# Patient Record
Sex: Male | Born: 1950 | Race: White | Hispanic: No | Smoking: Former smoker
Health system: Southern US, Community
[De-identification: ages and names within clinical notes are randomized; demographics above are authoritative.]

## PROBLEM LIST (undated history)

## (undated) DIAGNOSIS — Z9981 Dependence on supplemental oxygen: Secondary | ICD-10-CM

## (undated) DIAGNOSIS — E785 Hyperlipidemia, unspecified: Secondary | ICD-10-CM

## (undated) DIAGNOSIS — I712 Thoracic aortic aneurysm, without rupture, unspecified: Secondary | ICD-10-CM

## (undated) DIAGNOSIS — F419 Anxiety disorder, unspecified: Secondary | ICD-10-CM

## (undated) DIAGNOSIS — I639 Cerebral infarction, unspecified: Secondary | ICD-10-CM

## (undated) DIAGNOSIS — I2699 Other pulmonary embolism without acute cor pulmonale: Secondary | ICD-10-CM

## (undated) DIAGNOSIS — I10 Essential (primary) hypertension: Secondary | ICD-10-CM

## (undated) DIAGNOSIS — D751 Secondary polycythemia: Secondary | ICD-10-CM

## (undated) DIAGNOSIS — E119 Type 2 diabetes mellitus without complications: Secondary | ICD-10-CM

## (undated) DIAGNOSIS — K219 Gastro-esophageal reflux disease without esophagitis: Secondary | ICD-10-CM

## (undated) DIAGNOSIS — G709 Myoneural disorder, unspecified: Secondary | ICD-10-CM

## (undated) DIAGNOSIS — K625 Hemorrhage of anus and rectum: Secondary | ICD-10-CM

## (undated) DIAGNOSIS — J189 Pneumonia, unspecified organism: Secondary | ICD-10-CM

## (undated) DIAGNOSIS — J449 Chronic obstructive pulmonary disease, unspecified: Secondary | ICD-10-CM

## (undated) DIAGNOSIS — D759 Disease of blood and blood-forming organs, unspecified: Secondary | ICD-10-CM

## (undated) DIAGNOSIS — R0602 Shortness of breath: Secondary | ICD-10-CM

## (undated) DIAGNOSIS — R040 Epistaxis: Secondary | ICD-10-CM

## (undated) DIAGNOSIS — M25519 Pain in unspecified shoulder: Secondary | ICD-10-CM

## (undated) DIAGNOSIS — M1990 Unspecified osteoarthritis, unspecified site: Secondary | ICD-10-CM

## (undated) HISTORY — DX: Cerebral infarction, unspecified: I63.9

## (undated) HISTORY — DX: Hyperlipidemia, unspecified: E78.5

## (undated) HISTORY — PX: MULTIPLE TOOTH EXTRACTIONS: SHX2053

## (undated) HISTORY — DX: Essential (primary) hypertension: I10

## (undated) HISTORY — PX: INSERTION OF VENA CAVA FILTER: SHX5871

## (undated) HISTORY — DX: Hemorrhage of anus and rectum: K62.5

## (undated) HISTORY — DX: Gastro-esophageal reflux disease without esophagitis: K21.9

---

## 2006-09-24 ENCOUNTER — Ambulatory Visit: Payer: Self-pay | Admitting: Internal Medicine

## 2006-11-03 ENCOUNTER — Ambulatory Visit: Payer: Self-pay | Admitting: Internal Medicine

## 2006-11-03 DIAGNOSIS — Z8679 Personal history of other diseases of the circulatory system: Secondary | ICD-10-CM | POA: Insufficient documentation

## 2006-11-04 ENCOUNTER — Encounter: Payer: Self-pay | Admitting: Internal Medicine

## 2006-11-04 ENCOUNTER — Ambulatory Visit: Payer: Self-pay

## 2006-11-04 LAB — CONVERTED CEMR LAB
Cholesterol: 224 mg/dL (ref 0–200)
Direct LDL: 173.5 mg/dL
HDL: 31.4 mg/dL — ABNORMAL LOW (ref >39.0)
Total CHOL/HDL Ratio: 7.1
Triglycerides: 115 mg/dL (ref 0–149)
VLDL: 23 mg/dL (ref 0–40)

## 2006-11-13 ENCOUNTER — Ambulatory Visit: Payer: Self-pay | Admitting: Internal Medicine

## 2006-11-13 DIAGNOSIS — I10 Essential (primary) hypertension: Secondary | ICD-10-CM | POA: Insufficient documentation

## 2006-11-18 DIAGNOSIS — K219 Gastro-esophageal reflux disease without esophagitis: Secondary | ICD-10-CM | POA: Insufficient documentation

## 2006-11-24 ENCOUNTER — Ambulatory Visit: Payer: Self-pay | Admitting: Internal Medicine

## 2007-02-18 ENCOUNTER — Ambulatory Visit: Payer: Self-pay | Admitting: Internal Medicine

## 2008-01-13 ENCOUNTER — Telehealth: Payer: Self-pay | Admitting: Internal Medicine

## 2008-01-15 ENCOUNTER — Ambulatory Visit: Payer: Self-pay | Admitting: Internal Medicine

## 2008-01-15 DIAGNOSIS — E785 Hyperlipidemia, unspecified: Secondary | ICD-10-CM | POA: Insufficient documentation

## 2008-01-15 DIAGNOSIS — F17200 Nicotine dependence, unspecified, uncomplicated: Secondary | ICD-10-CM

## 2008-01-15 LAB — CONVERTED CEMR LAB
CO2: 33 meq/L — ABNORMAL HIGH (ref 19–32)
Chloride: 98 meq/L (ref 96–112)
Glucose, Bld: 97 mg/dL (ref 70–99)
Potassium: 4.5 meq/L (ref 3.5–5.1)
Sodium: 139 meq/L (ref 135–145)

## 2008-06-13 ENCOUNTER — Telehealth: Payer: Self-pay | Admitting: Internal Medicine

## 2008-06-21 ENCOUNTER — Telehealth: Payer: Self-pay | Admitting: Internal Medicine

## 2009-04-24 ENCOUNTER — Ambulatory Visit: Payer: Self-pay | Admitting: Internal Medicine

## 2009-04-27 LAB — CONVERTED CEMR LAB
BUN: 16 mg/dL (ref 6–23)
Chloride: 107 meq/L (ref 96–112)
GFR calc non Af Amer: 91.2 mL/min (ref >60)
Glucose, Bld: 109 mg/dL — ABNORMAL HIGH (ref 70–99)
Potassium: 4.7 meq/L (ref 3.5–5.1)
Sodium: 144 meq/L (ref 135–145)

## 2009-05-09 ENCOUNTER — Ambulatory Visit: Payer: Self-pay | Admitting: Internal Medicine

## 2010-05-08 NOTE — Assessment & Plan Note (Signed)
Summary: cryotherapy/et   Vital Signs:  Patient profile:   60 year old male BP sitting:   128 / 88  (left arm)  Vitals Entered By: Gladis Riffle, RN (May 09, 2009 9:19 AM)  Procedure Note  Skin Tag Removal: Onset of lesion: months Indication: changing lesion Consent signed: no    # lesions removed: 6    Comment: verbal consent  Wart Removal: Onset of lesion: 6 months Indication: enlarging lesion  Procedure # 1: cryotherapy    Location: lip    # lesions removed: 1    Comment: informed onsent obtained  CC: cryotherapy to warts and skin tags upper lip, neck, inner rightt thigh Is Patient Diabetic? No   CC:  cryotherapy to warts and skin tags upper lip, neck, and inner rightt thigh.  Preventive Screening-Counseling & Management  Alcohol-Tobacco     Smoking Status: current     Packs/Day: 0.5  Medications Prior to Update: 1)  Lisinopril-Hydrochlorothiazide 20-25 Mg Tabs (Lisinopril-Hydrochlorothiazide) .... Take 1 Tablet By Mouth Once A Day 2)  Adult Aspirin Ec Low Strength 81 Mg  Tbec (Aspirin) .... One Daily 3)  Cialis 20 Mg Tabs (Tadalafil) .... Take One Every 3 Days As Needed  Allergies (verified): No Known Drug Allergies   Complete Medication List: 1)  Lisinopril-hydrochlorothiazide 20-25 Mg Tabs (Lisinopril-hydrochlorothiazide) .... Take 1 tablet by mouth once a day 2)  Adult Aspirin Ec Low Strength 81 Mg Tbec (Aspirin) .... One daily 3)  Cialis 20 Mg Tabs (Tadalafil) .... Take one every 3 days as needed  Other Orders: Excise other (benign) lesion (FEENLM), 0 - 0.5 cm (11440) Prescriptions: CIALIS 20 MG TABS (TADALAFIL) take one every 3 days as needed  #10 x 3   Entered by:   Gladis Riffle, RN   Authorized by:   Birdie Sons MD   Signed by:   Gladis Riffle, RN on 05/09/2009   Method used:   Electronically to        [facility]  [phone]* (retail)       [address]       [place], [place]  [address]       Ph:  [phone] or [phone]       Fax: [phone]   RxID:   [id number] CIALIS 20 MG TABS (TADALAFIL) take one every 3 days as needed  #10 x 3   Entered by:   Gladis Riffle, RN   Authorized by:   Birdie Sons MD   Signed by:   Gladis Riffle, RN on 05/09/2009   Method used:   Print then Give to Patient   RxID:   [id number]

## 2010-05-08 NOTE — Assessment & Plan Note (Signed)
Summary: fup on meds//ccm   Vital Signs:  Patient profile:   60 year old male Weight:      219 pounds Temp:     97.8 degrees F Pulse rate:   84 / minute Resp:     12 per minute BP sitting:   128 / 90  (left arm)  Vitals Entered By: Gladis Riffle, RN (April 24, 2009 9:14 AM)   History of Present Illness: f/u labs  Preventive Screening-Counseling & Management  Alcohol-Tobacco     Smoking Status: current     Packs/Day: 0.5  Current Problems (verified): 1)  Hyperlipidemia  (ICD-272.4) 2)  Tobacco Use  (ICD-305.1) 3)  Gerd  (ICD-530.81) 4)  Hypertension  (ICD-401.9) 5)  Cerebrovascular Accident, Hx of  (ICD-V12.50)  Current Medications (verified): 1)  Lisinopril-Hydrochlorothiazide 20-25 Mg Tabs (Lisinopril-Hydrochlorothiazide) .... Take 1 Tablet By Mouth Once A Day 2)  Adult Aspirin Ec Low Strength 81 Mg  Tbec (Aspirin) .... One Daily 3)  Cialis 20 Mg Tabs (Tadalafil) .... Q3d Prn  Allergies (verified): No Known Drug Allergies  Comments:  Nurse/Medical Assistant: FU meds--has decreased smoking to 1/2 pack per day, had reaction to patch--BP 132-135/80-85 at home  The patient's medications and allergies were reviewed with the patient and were updated in the Medication and Allergy Lists. Gladis Riffle, RN (April 24, 2009 9:16 AM)  Past History:  Past Medical History: Last updated: 01/15/2008 Cerebrovascular accident, hx of Hypertension GERD Hyperlipidemia  Past Surgical History: Last updated: 11/13/2006 Denies surgical history  Family History: Last updated: 04/24/2009 Family History Hypertension mother copd---86 yo deceased  Social History: Last updated: 01/15/2008 Current Smoker Occupation: Divorced Alcohol use-yes Drug use-no Regular exercise-yes raising 3 granddaughters  Risk Factors: Exercise: yes (11/18/2006)  Risk Factors: Smoking Status: current (04/24/2009) Packs/Day: 0.5 (04/24/2009)  Family History: Family History  Hypertension mother copd---86 yo deceased  Social History: Packs/Day:  0.5   Impression & Recommendations:  Problem # 1:  HYPERTENSION (ICD-401.9)  fair control His updated medication list for this problem includes:    Lisinopril-hydrochlorothiazide 20-25 Mg Tabs (Lisinopril-hydrochlorothiazide) .Marland Kitchen... Take 1 tablet by mouth once a day  BP today: 128/90 Prior BP: 140/94 (01/15/2008)  Prior 10 Yr Risk Heart Disease: Not enough information (11/24/2006)  Labs Reviewed: K+: 4.5 (01/15/2008) Creat: : 1.2 (01/15/2008)   Chol: 224 (11/03/2006)   HDL: 31.4 (11/03/2006)   LDL: DEL (11/03/2006)   TG: 115 (11/03/2006)  Orders: Venipuncture (16109) TLB-BMP (Basic Metabolic Panel-BMET) (80048-METABOL)  Problem # 2:  HYPERLIPIDEMIA (ICD-272.4) he is not interested in checking labs or treating Prior 10 Yr Risk Heart Disease: Not enough information (11/24/2006)   HDL:31.4 (11/03/2006)  LDL:DEL (11/03/2006)  Chol:224 (11/03/2006)  Trig:115 (11/03/2006)  Problem # 3:  TOBACCO USE (ICD-305.1)  Encouraged smoking cessation and discussed different methods for smoking cessation.  information given for Chantix  Problem # 4:  GERD (ICD-530.81) no sxs and not on any meds  Complete Medication List: 1)  Lisinopril-hydrochlorothiazide 20-25 Mg Tabs (Lisinopril-hydrochlorothiazide) .... Take 1 tablet by mouth once a day 2)  Adult Aspirin Ec Low Strength 81 Mg Tbec (Aspirin) .... One daily 3)  Cialis 20 Mg Tabs (Tadalafil) .... Take one every 3 days as needed Prescriptions: CIALIS 20 MG TABS (TADALAFIL) take one every 3 days as needed  #10 x 3   Entered and Authorized by:   Birdie Sons MD   Signed by:   Birdie Sons MD on 04/24/2009   Method used:   Electronically to  [facility]  [address]  [phone]* (retail)       [address]       [place], [place]  [address]       Ph: [phone] or [phone]       Fax: [phone]   RxID:    [id number] LISINOPRIL-HYDROCHLOROTHIAZIDE 20-25 MG TABS (LISINOPRIL-HYDROCHLOROTHIAZIDE) Take 1 tablet by mouth once a day  #90 x 3   Entered and Authorized by:   Birdie Sons MD   Signed by:   Birdie Sons MD on 04/24/2009   Method used:   Electronically to        [facility]  [phone]* (retail)       [address]       [place], [place]  [address]       Ph: [phone] or [phone]       Fax: [phone]   RxID:   [id number]

## 2010-05-28 ENCOUNTER — Encounter: Payer: Self-pay | Admitting: Internal Medicine

## 2010-05-28 ENCOUNTER — Ambulatory Visit (INDEPENDENT_AMBULATORY_CARE_PROVIDER_SITE_OTHER): Payer: Self-pay | Admitting: Internal Medicine

## 2010-05-28 VITALS — BP 120/70 | Temp 98.2°F | Wt 211.0 lb

## 2010-05-28 DIAGNOSIS — K0530 Chronic periodontitis, unspecified: Secondary | ICD-10-CM

## 2010-05-28 DIAGNOSIS — K047 Periapical abscess without sinus: Secondary | ICD-10-CM

## 2010-05-28 MED ORDER — PENICILLIN V POTASSIUM 500 MG PO TABS: 500.0000 mg | ORAL_TABLET | Freq: Four times a day (QID) | ORAL | Status: AC

## 2010-05-28 NOTE — Progress Notes (Signed)
  Subjective:    Patient ID: Cody [name], male    DOB: 12-22-1950, 60 y.o.   MRN: [id number]  HPI  60 year old patient who has chronic periodontal disease and chronic dental infections requiring multiple dental extractions.  His dentist has recommended total extractions Due to chronic significant infections.  More recently has developed increasing pain involving primarily the right upper incisor.  He is able to easily express pus from around this tooth that is quite foul-smelling.  He is plan on seeing his dentist next week for further extractions.  He feels unwell with weakness, nausea.    Review of Systems  Constitutional: Positive for appetite change and fatigue. Negative for fever and chills.  HENT: Negative for hearing loss, ear pain, congestion, sore throat, trouble swallowing, neck stiffness, dental problem, voice change and tinnitus.   Eyes: Negative for pain, discharge and visual disturbance.  Respiratory: Negative for cough, chest tightness, wheezing and stridor.   Cardiovascular: Negative for chest pain, palpitations and leg swelling.  Gastrointestinal: Negative for nausea, vomiting, abdominal pain, diarrhea, constipation, blood in stool and abdominal distention.  Genitourinary: Negative for urgency, hematuria, flank pain, discharge, difficulty urinating and genital sores.  Musculoskeletal: Negative for myalgias, back pain, joint swelling, arthralgias and gait problem.  Skin: Negative for rash.  Neurological: Negative for dizziness, syncope, speech difficulty, weakness, numbness and headaches.  Hematological: Negative for adenopathy. Does not bruise/bleed easily.  Psychiatric/Behavioral: Negative for behavioral problems and dysphoric mood. The patient is not nervous/anxious.        Objective:   Physical Exam  Constitutional: He appears well-developed and well-nourished. No distress.  HENT:  Right Ear: External ear normal.  Left Ear: External ear normal.  Nose: Nose normal.    Mouth/Throat: Oropharynx is clear and moist. No oropharyngeal exudate.       Multiple missing dentition; extremely poor oral hygiene with the multiple carious remaining teeth.  A right upper incisor was loose with pus easily expressed about the tooth          Assessment & Plan:  Chronic dental infection- will place on penicillin; he does have An appointment  to see his dentist next week;  He is aware of the need for prompt dental extraction and will attempt to call his dentist today and schedule an earlier appointment within the next one or two days

## 2010-05-28 NOTE — Patient Instructions (Signed)
Take your antibiotic as prescribed until ALL of it is gone, but stop if you develop a rash, swelling, or any side effects of the medication.  Contact our office as soon as possible if  there are side effects of the medication.  Dental follow up ASAP   Call or return to clinic prn if these symptoms worsen or fail to improve as anticipated.

## 2010-07-05 ENCOUNTER — Other Ambulatory Visit: Payer: Self-pay | Admitting: Internal Medicine

## 2010-07-05 DIAGNOSIS — I10 Essential (primary) hypertension: Secondary | ICD-10-CM

## 2010-09-19 ENCOUNTER — Encounter: Payer: Self-pay | Admitting: Internal Medicine

## 2010-09-19 ENCOUNTER — Ambulatory Visit (INDEPENDENT_AMBULATORY_CARE_PROVIDER_SITE_OTHER): Payer: Self-pay | Admitting: Internal Medicine

## 2010-09-19 VITALS — BP 128/98 | HR 96 | Temp 98.6°F | Ht 70.5 in | Wt 210.0 lb

## 2010-09-19 DIAGNOSIS — L918 Other hypertrophic disorders of the skin: Secondary | ICD-10-CM

## 2010-09-19 DIAGNOSIS — L909 Atrophic disorder of skin, unspecified: Secondary | ICD-10-CM

## 2010-09-27 NOTE — Progress Notes (Signed)
  Subjective:    Patient ID: Cody [name], male    DOB: Jul 13, 1950, 59 y.o.   MRN: [id number]  HPI    Review of Systems     Objective:   Physical Exam        Assessment & Plan:  Patient has a large skin tag on his upper left lip. After risks and benefits were discussed he agreed to excision. The area was prepped and draped in sterile fashion. It was infiltrated with 1% lidocaine and epinephrine. The lesion was excised. Bleeding was controlled. Postoperative care discussed with patient.

## 2011-01-22 ENCOUNTER — Other Ambulatory Visit: Payer: Self-pay | Admitting: Internal Medicine

## 2011-01-22 ENCOUNTER — Telehealth: Payer: Self-pay | Admitting: Internal Medicine

## 2011-01-22 NOTE — Telephone Encounter (Signed)
Refill Lisinopril to [facility]. Thanks.

## 2011-01-23 NOTE — Telephone Encounter (Signed)
rx sent in electronically 

## 2011-06-17 ENCOUNTER — Telehealth: Payer: Self-pay | Admitting: Internal Medicine

## 2011-06-17 NOTE — Telephone Encounter (Signed)
Pt is experiencing fever, cough, fatigue and back ache and would like to be worked in to see Dr. Cato Mulligan. Pt was offered other doctors but insisted on seeing Dr. Cato Mulligan. Please contact

## 2011-06-17 NOTE — Telephone Encounter (Signed)
appt scheduled, pt notified

## 2011-06-17 NOTE — Telephone Encounter (Signed)
Have him see me tomorrow at 8 a.m.

## 2011-06-18 ENCOUNTER — Ambulatory Visit (INDEPENDENT_AMBULATORY_CARE_PROVIDER_SITE_OTHER): Payer: Self-pay | Admitting: Internal Medicine

## 2011-06-18 ENCOUNTER — Encounter: Payer: Self-pay | Admitting: Internal Medicine

## 2011-06-18 VITALS — BP 150/102 | HR 99 | Temp 98.6°F | Wt 218.0 lb

## 2011-06-18 DIAGNOSIS — J449 Chronic obstructive pulmonary disease, unspecified: Secondary | ICD-10-CM

## 2011-06-18 MED ORDER — BUDESONIDE-FORMOTEROL FUMARATE 160-4.5 MCG/ACT IN AERO: 2.0000 | INHALATION_SPRAY | Freq: Two times a day (BID) | RESPIRATORY_TRACT | Status: DC

## 2011-06-18 MED ORDER — PREDNISONE 10 MG PO TABS: ORAL_TABLET | ORAL | Status: DC

## 2011-06-18 MED ORDER — DOXYCYCLINE HYCLATE 100 MG PO TABS: 100.0000 mg | ORAL_TABLET | Freq: Two times a day (BID) | ORAL | Status: AC

## 2011-06-19 DIAGNOSIS — J449 Chronic obstructive pulmonary disease, unspecified: Secondary | ICD-10-CM | POA: Insufficient documentation

## 2011-06-19 NOTE — Assessment & Plan Note (Signed)
He  Understands need to quit smoking. Trial steroids, abx and combination inhaler He will call in 1-2 weeks if not significantly better. At that time he would need labs, and cxr

## 2011-06-19 NOTE — Progress Notes (Signed)
Patient ID: Cody [name], male   DOB: 04-Jan-1951, 61 y.o.   MRN: [id number] Pt presents with fatigue, cough and SOB Smokes 1/2 ppd  Bilateral wheezing on exam.

## 2011-06-25 ENCOUNTER — Telehealth: Payer: Self-pay | Admitting: Internal Medicine

## 2011-06-25 DIAGNOSIS — R05 Cough: Secondary | ICD-10-CM

## 2011-06-25 MED ORDER — POTASSIUM CHLORIDE CRYS ER 20 MEQ PO TBCR: 20.0000 meq | EXTENDED_RELEASE_TABLET | Freq: Every day | ORAL | Status: DC

## 2011-06-25 MED ORDER — FUROSEMIDE 20 MG PO TABS: 20.0000 mg | ORAL_TABLET | Freq: Every day | ORAL | Status: DC

## 2011-06-25 NOTE — Telephone Encounter (Signed)
Pt was prescribed prednisone, but med is causing swelling in ankles and calves. Pt discontinued med on Saturday 06/22/11, but swelling hasn't gone done, but has gotten worse. Pt req call back from nurse.

## 2011-06-25 NOTE — Telephone Encounter (Signed)
Per [name] pt is not coughing yellow  Mucus anymore but it is clear.  Doxycycline is helping but pt is still coughing up a lot of mucus.  Pt is still having SOB.  [name] is aware to call back if pt's symptoms worsen. Rx for furosemide 20 and kdur 20 sent to [facility] on [address].

## 2011-06-25 NOTE — Telephone Encounter (Signed)
Stay off prednisone.  Trial furosemide 20 mg po qd for 5 days with kdur 20 meq po qd for 5 days.  Make sure breathing is better

## 2011-06-26 NOTE — Telephone Encounter (Signed)
Left message for pt to call back  °

## 2011-06-26 NOTE — Telephone Encounter (Signed)
Check cxr .   

## 2011-06-27 NOTE — Telephone Encounter (Signed)
Pt aware, order placed 

## 2011-06-27 NOTE — Telephone Encounter (Signed)
Addended by: Alfred Levins D on: 06/27/2011 11:10 AM   Modules accepted: Orders

## 2011-08-05 ENCOUNTER — Ambulatory Visit (INDEPENDENT_AMBULATORY_CARE_PROVIDER_SITE_OTHER): Payer: Self-pay | Admitting: Family

## 2011-08-05 ENCOUNTER — Encounter: Payer: Self-pay | Admitting: Family

## 2011-08-05 VITALS — BP 162/100 | HR 93 | Temp 97.9°F | Wt 214.0 lb

## 2011-08-05 DIAGNOSIS — R609 Edema, unspecified: Secondary | ICD-10-CM

## 2011-08-05 DIAGNOSIS — R0602 Shortness of breath: Secondary | ICD-10-CM

## 2011-08-05 DIAGNOSIS — J449 Chronic obstructive pulmonary disease, unspecified: Secondary | ICD-10-CM

## 2011-08-05 NOTE — Patient Instructions (Signed)

## 2011-08-05 NOTE — Progress Notes (Signed)
Subjective:    Patient ID: Cody [name], male    DOB: Oct 07, 1950, 61 y.o.   MRN: [id number]  HPI 61 year old white male, half pack per day smoker, patient nerves Dr. Cato Mulligan is in today with complaints of fatigue, shortness of breath, and peripheral edema times one month. Patient was started on Lasix and potassium by Dr. Cato Mulligan a month ago, his swelling increased and Dr. Cato Mulligan discontinue therapy. Patient reports being extremely fatigued towards the end of the day. He is recently decreased from 2 packs of cigarettes a day to a half a pack per day. On his last office visit he was then 78% on room air. Blood pressure 160s over 100. Patient denies lightheadedness, dizziness, chest pain or palpitations.  Socially, patient is raising his 3 granddaughters.    Review of Systems  Constitutional: Positive for fatigue.  HENT: Negative.   Eyes: Negative.   Respiratory: Positive for shortness of breath.   Cardiovascular: Positive for leg swelling. Negative for chest pain and palpitations.  Gastrointestinal: Negative.   Musculoskeletal: Negative.   Skin: Negative.   Neurological: Negative.   Hematological: Negative.   Psychiatric/Behavioral: Negative.    Past Medical History  Diagnosis Date  . Stroke   . Hypertension   . Hyperlipidemia   . GERD (gastroesophageal reflux disease)     History   Social History  . Marital Status: Single    Spouse Name: N/A    Number of Children: N/A  . Years of Education: N/A   Occupational History  . Not on file.   Social History Main Topics  . Smoking status: Current Everyday Smoker -- 0.5 packs/day    Types: Cigarettes  . Smokeless tobacco: Never Used  . Alcohol Use: No  . Drug Use: No  . Sexually Active: Not on file   Other Topics Concern  . Not on file   Social History Narrative  . No narrative on file    No past surgical history on file.  Family History  Problem Relation Age of Onset  . COPD Mother     No Known  Allergies  Current Outpatient Prescriptions on File Prior to Visit  Medication Sig Dispense Refill  . aspirin 81 MG tablet Take 81 mg by mouth daily.        Marland Kitchen lisinopril-hydrochlorothiazide (PRINZIDE,ZESTORETIC) 20-25 MG per tablet TAKE ONE TABLET BY MOUTH EVERY DAY.  90 tablet  1  . tadalafil (CIALIS) 20 MG tablet Take 20 mg by mouth. Take one pill every 3 days as needed       . budesonide-formoterol (SYMBICORT) 160-4.5 MCG/ACT inhaler Inhale 2 puffs into the lungs 2 (two) times daily.  1 Inhaler  3  . furosemide (LASIX) 20 MG tablet Take 1 tablet (20 mg total) by mouth daily.  5 tablet  0  . potassium chloride SA (K-DUR,KLOR-CON) 20 MEQ tablet Take 1 tablet (20 mEq total) by mouth daily.  5 tablet  0  . predniSONE (DELTASONE) 10 MG tablet 4 tabs, then 2 tabs, then 1 tab, then 1/2 tab daily daily for 3 days  25 tablet  0    BP 162/100  Pulse 93  Temp 97.9 F (36.6 C)  Wt 214 lb (97.07 kg)  SpO2 78%chart   Objective:   Physical Exam  Constitutional: He is oriented to person, place, and time. He appears well-developed and well-nourished.  HENT:  Right Ear: External ear normal.  Left Ear: External ear normal.  Nose: Nose normal.  Mouth/Throat: Oropharynx is clear and  moist.  Eyes: Conjunctivae are normal. Pupils are equal, round, and reactive to light.  Neck: Neck supple.  Cardiovascular: Normal rate, regular rhythm and normal heart sounds.   Pulmonary/Chest: Effort normal and breath sounds normal. No respiratory distress.       Sat 78% RA before and after exercise.  Abdominal: Bowel sounds are normal.  Musculoskeletal: He exhibits edema.        bilateral peripheral edema. 2+ pitting edema noted to the right. 1+ pitting edema noted to the left.  Neurological: He is alert and oriented to person, place, and time.  Skin: Skin is warm and dry.  Psychiatric: He has a normal mood and affect.       EKG-old ischemic. Possible right atrium enlargement    Assessment & Plan:   Assessment: Peripheral edema, COPD, fatigue  Plan: Labs sents to include BMP, BNP notify patient pending results. We'll obtain a chest x-ray. Patient will resume his Symbicort 2 puffs twice a day. I believe that his symptoms today are related to congestive heart failure. Will await lab results and x-ray and refer to cardiology for further management. Home O2 will be ordered since he statin 78%.

## 2011-08-06 ENCOUNTER — Other Ambulatory Visit: Payer: Self-pay | Admitting: Family

## 2011-08-06 DIAGNOSIS — R0902 Hypoxemia: Secondary | ICD-10-CM

## 2011-08-06 LAB — BASIC METABOLIC PANEL
BUN: 14 mg/dL (ref 6–23)
Calcium: 9 mg/dL (ref 8.4–10.5)
GFR: 94.14 mL/min (ref >60.00)
Glucose, Bld: 85 mg/dL (ref 70–99)

## 2011-08-07 ENCOUNTER — Other Ambulatory Visit: Payer: Self-pay | Admitting: Family

## 2011-08-07 ENCOUNTER — Telehealth: Payer: Self-pay | Admitting: Internal Medicine

## 2011-08-07 DIAGNOSIS — Z720 Tobacco use: Secondary | ICD-10-CM

## 2011-08-07 DIAGNOSIS — R0602 Shortness of breath: Secondary | ICD-10-CM

## 2011-08-07 NOTE — Telephone Encounter (Signed)
[facility] called re: the referral that was sent over yesterday for oxygen. [facility] can not provide oxygen for pt because pt is self pay and does not have any insurance.

## 2011-08-07 NOTE — Telephone Encounter (Signed)
rerouting

## 2011-08-07 NOTE — Telephone Encounter (Signed)
Patient needs oxygen. Please find someone to provide assistance.

## 2011-08-27 ENCOUNTER — Institutional Professional Consult (permissible substitution): Payer: Self-pay | Admitting: Cardiology

## 2011-08-27 ENCOUNTER — Other Ambulatory Visit: Payer: Self-pay | Admitting: Internal Medicine

## 2011-09-04 ENCOUNTER — Telehealth: Payer: Self-pay | Admitting: Family Medicine

## 2011-09-04 NOTE — Telephone Encounter (Signed)
We received notification that this patient did not get the chest xray that was ordered 06/27/11. Thank you.

## 2011-09-09 ENCOUNTER — Ambulatory Visit (INDEPENDENT_AMBULATORY_CARE_PROVIDER_SITE_OTHER): Payer: Self-pay | Admitting: Internal Medicine

## 2011-09-09 VITALS — BP 156/100 | HR 100 | Temp 98.2°F | Wt 212.0 lb

## 2011-09-09 DIAGNOSIS — J449 Chronic obstructive pulmonary disease, unspecified: Secondary | ICD-10-CM

## 2011-09-09 DIAGNOSIS — I10 Essential (primary) hypertension: Secondary | ICD-10-CM

## 2011-09-09 DIAGNOSIS — F17200 Nicotine dependence, unspecified, uncomplicated: Secondary | ICD-10-CM

## 2011-09-09 MED ORDER — LISINOPRIL 40 MG PO TABS: 40.0000 mg | ORAL_TABLET | Freq: Every day | ORAL | Status: DC

## 2011-09-09 MED ORDER — HYDROCHLOROTHIAZIDE 25 MG PO TABS: 25.0000 mg | ORAL_TABLET | Freq: Every day | ORAL | Status: DC

## 2011-09-09 NOTE — Assessment & Plan Note (Signed)
HE HAS QUIT SMOKING!

## 2011-09-09 NOTE — Assessment & Plan Note (Signed)
Not as well controlled But he is feeling well  Reviewed previous BP (BP at dentist office also elevated 180/105) Recheck bp 150/100.  Will increase lisinopril

## 2011-09-09 NOTE — Assessment & Plan Note (Signed)
HE HAS QUIT!!!

## 2011-09-10 NOTE — Progress Notes (Signed)
Patient ID: Cody [name], male   DOB: [birth year], 61 y.o.   MRN: [id number]  htn--- home bps 150s/90-105

## 2011-09-27 ENCOUNTER — Institutional Professional Consult (permissible substitution): Payer: Self-pay | Admitting: Cardiology

## 2011-10-15 ENCOUNTER — Institutional Professional Consult (permissible substitution): Payer: Self-pay | Admitting: Cardiology

## 2011-11-25 ENCOUNTER — Institutional Professional Consult (permissible substitution): Payer: Self-pay | Admitting: Cardiology

## 2011-12-25 ENCOUNTER — Institutional Professional Consult (permissible substitution): Payer: Self-pay | Admitting: Cardiology

## 2012-02-21 ENCOUNTER — Other Ambulatory Visit: Payer: Self-pay | Admitting: Internal Medicine

## 2012-02-21 MED ORDER — AMLODIPINE BESYLATE 5 MG PO TABS: 5.0000 mg | ORAL_TABLET | Freq: Every day | ORAL | Status: DC

## 2012-02-21 NOTE — Progress Notes (Signed)
Pt called me--- bp elevated 160s/100  Call in norvasc- see me next week for bp check and labs

## 2012-02-28 ENCOUNTER — Ambulatory Visit (INDEPENDENT_AMBULATORY_CARE_PROVIDER_SITE_OTHER): Payer: Self-pay | Admitting: Family Medicine

## 2012-02-28 ENCOUNTER — Ambulatory Visit: Payer: Self-pay | Admitting: Internal Medicine

## 2012-02-28 ENCOUNTER — Encounter: Payer: Self-pay | Admitting: Family Medicine

## 2012-02-28 VITALS — BP 192/120 | HR 97 | Temp 97.9°F | Wt 226.0 lb

## 2012-02-28 DIAGNOSIS — R0989 Other specified symptoms and signs involving the circulatory and respiratory systems: Secondary | ICD-10-CM

## 2012-02-28 DIAGNOSIS — I10 Essential (primary) hypertension: Secondary | ICD-10-CM

## 2012-02-28 DIAGNOSIS — R0683 Snoring: Secondary | ICD-10-CM

## 2012-02-28 MED ORDER — HYDROCHLOROTHIAZIDE 25 MG PO TABS: 25.0000 mg | ORAL_TABLET | Freq: Every day | ORAL | Status: DC

## 2012-02-28 NOTE — Patient Instructions (Addendum)
-  restart your HYDROCHLOROTHIAZIDE (HCTZ/HYDRODIURIL)   -Take the HCTZ, NORVASC and LISINOPRIL daily  Follow up with Dr. Cato Mulligan in 2-4 weeks or sooner if any concerns

## 2012-02-28 NOTE — Progress Notes (Signed)
Chief Complaint  Patient presents with  . Hypertension    HPI:  HTN: -long history problems with BP -running high on and off for a long time -his PCP doubled lisinopril to 40mg  daily 5 weeks ago, then added norvasc 5mg  last week -HCTZ 25mg  on list - but reports he hasn't been taking this lately because it was combined with his lisinopril but doesn't know why he isn't taking this anymore - reports this had really helped his BP -here today for recheck and BP with nurse was still very high -reports checking everyday at home and runs 177 -190s/120 -on ROC BP running high for a long time, no documentation in chart of recent med changes reported by patient - pt reports has been talking via phone to provider -denies: CP, SOB, fevers, swelling, palpitations, dysuria, vision changes -has headaches on and off since this happened - no headache now, but does have suboccipital muscle pain chronically -no alcohol, never tested for OSA - wife reports snores and has apneic spells, reports he doesn't sleep well and does have daytime drowsiness  ROS: See pertinent positives and negatives per HPI.  Past Medical History  Diagnosis Date  . Stroke   . Hypertension   . Hyperlipidemia   . GERD (gastroesophageal reflux disease)     Family History  Problem Relation Age of Onset  . COPD Mother     History   Social History  . Marital Status: Single    Spouse Name: N/A    Number of Children: N/A  . Years of Education: N/A   Social History Main Topics  . Smoking status: Current Every Day Smoker -- 0.5 packs/day    Types: Cigarettes  . Smokeless tobacco: Never Used  . Alcohol Use: No  . Drug Use: No  . Sexually Active: None   Other Topics Concern  . None   Social History Narrative  . None    Current outpatient prescriptions:amLODipine (NORVASC) 5 MG tablet, Take 1 tablet (5 mg total) by mouth daily., Disp: 90 tablet, Rfl: 3;  aspirin 81 MG tablet, Take 81 mg by mouth daily.  , Disp: , Rfl:  ;  budesonide-formoterol (SYMBICORT) 160-4.5 MCG/ACT inhaler, Inhale 2 puffs into the lungs 2 (two) times daily., Disp: 1 Inhaler, Rfl: 3 lisinopril (PRINIVIL,ZESTRIL) 40 MG tablet, Take 1 tablet (40 mg total) by mouth daily., Disp: 90 tablet, Rfl: 3;  tadalafil (CIALIS) 20 MG tablet, Take 20 mg by mouth. Take one pill every 3 days as needed , Disp: , Rfl: ;  hydrochlorothiazide (HYDRODIURIL) 25 MG tablet, Take 1 tablet (25 mg total) by mouth daily., Disp: 90 tablet, Rfl: 3 [DISCONTINUED] hydrochlorothiazide (HYDRODIURIL) 25 MG tablet, Take 1 tablet (25 mg total) by mouth daily., Disp: 90 tablet, Rfl: 3  EXAM:  Filed Vitals:   02/28/12 1106  BP: 192/120  Pulse: 97  Temp: 97.9 F (36.6 C)   Recheck BP sitting: 162/110 There is no height on file to calculate BMI.  GENERAL: vitals reviewed and listed above, alert, oriented, appears well hydrated and in no acute distress  HEENT: atraumatic, conjunttiva clear, no obvious abnormalities on inspection of external nose and ears  NECK: no obvious masses on inspection  LUNGS: clear to auscultation bilaterally, no wheezes, rales or rhonchi, good air movement  CV: HRRR, no peripheral edema  MS: moves all extremities without noticeable abnormality, TTP suboccipital muscles - mild  PSYCH: pleasant and cooperative, no obvious depression or anxiety  ASSESSMENT AND PLAN:  Discussed the following assessment  and plan:  1. Hypertension  -uncontrolled, asymptomatic currently -appears he is confused about which meds he should be taking -per med list looks like should be on HCTZ too - reports script not at pharmacy and is why he stopped it -per wife snores, apneic spells in sleep, per pt daytime drowsiness   -restart hydrochlorothiazide (HYDRODIURIL) 25 MG tablet -continue other meds -follow up with nurse in 2 weeks -follow up with PCP in 2-4 weeks and notify PCP if BP running high at home -warned to see a doctor immediately if any CP, HAs, vision  changes, feeling bad or other concerns -advised sleep study - pt wants to discuss with PCP     2. Snoring, apneic spells in sleep per wife, daytime sleepiness, restless sleep - only sleeps 2 hours per night -advised sleep study, wants to discuss with PCP        -Patient advised to return or notify a doctor immediately if symptoms worsen or persist or new concerns arise.  Patient Instructions  -restart your HYDROCHLOROTHIAZIDE (HCTZ/HYDRODIURIL)   -Take the HCTZ, NORVASC and LISINOPRIL daily  Follow up with Dr. Sherlean Foot, [facility].

## 2012-03-11 ENCOUNTER — Encounter: Payer: Self-pay | Admitting: Internal Medicine

## 2012-03-25 ENCOUNTER — Encounter: Payer: Self-pay | Admitting: Internal Medicine

## 2012-03-25 ENCOUNTER — Ambulatory Visit (INDEPENDENT_AMBULATORY_CARE_PROVIDER_SITE_OTHER): Payer: Self-pay | Admitting: Internal Medicine

## 2012-03-25 VITALS — BP 180/112 | HR 102 | Temp 97.9°F | Wt 229.0 lb

## 2012-03-25 DIAGNOSIS — R5383 Other fatigue: Secondary | ICD-10-CM

## 2012-03-25 DIAGNOSIS — R5381 Other malaise: Secondary | ICD-10-CM

## 2012-03-25 LAB — LIPID PANEL
Cholesterol: 186 mg/dL (ref 0–200)
LDL Cholesterol: 125 mg/dL — ABNORMAL HIGH (ref 0–99)
Total CHOL/HDL Ratio: 5

## 2012-03-25 LAB — CBC WITH DIFFERENTIAL/PLATELET
Eosinophils Relative: 1.8 % (ref 0.0–5.0)
HCT: 55.7 % — ABNORMAL HIGH (ref 39.0–52.0)
Lymphs Abs: 1.3 10*3/uL (ref 0.7–4.0)
MCV: 92.3 fl (ref 78.0–100.0)
Monocytes Absolute: 0.8 10*3/uL (ref 0.1–1.0)
Platelets: 182 10*3/uL (ref 150.0–400.0)
WBC: 8.2 10*3/uL (ref 4.5–10.5)

## 2012-03-25 LAB — TSH: TSH: 0.85 u[IU]/mL (ref 0.35–5.50)

## 2012-03-28 NOTE — Progress Notes (Signed)
Patient ID: DESHAWN WITTY, male   DOB: [birth year], 61 y.o.   MRN: [id number] Not an ov

## 2012-04-03 ENCOUNTER — Other Ambulatory Visit: Payer: Self-pay | Admitting: Internal Medicine

## 2012-04-03 DIAGNOSIS — F17200 Nicotine dependence, unspecified, uncomplicated: Secondary | ICD-10-CM

## 2012-04-08 HISTORY — PX: TRACHEOSTOMY: SUR1362

## 2012-04-12 ENCOUNTER — Encounter (HOSPITAL_COMMUNITY): Payer: Self-pay

## 2012-04-12 ENCOUNTER — Emergency Department (HOSPITAL_COMMUNITY): Payer: Medicaid Other

## 2012-04-12 ENCOUNTER — Inpatient Hospital Stay (HOSPITAL_COMMUNITY)
Admission: EM | Admit: 2012-04-12 | Discharge: 2012-05-29 | DRG: 003 | Disposition: A | Discharge status: Rehabilitation Facility | Payer: Medicaid Other | Attending: Pulmonary Disease | Admitting: Pulmonary Disease

## 2012-04-12 DIAGNOSIS — D45 Polycythemia vera: Secondary | ICD-10-CM | POA: Diagnosis present

## 2012-04-12 DIAGNOSIS — N289 Disorder of kidney and ureter, unspecified: Secondary | ICD-10-CM

## 2012-04-12 DIAGNOSIS — J9600 Acute respiratory failure, unspecified whether with hypoxia or hypercapnia: Principal | ICD-10-CM | POA: Diagnosis present

## 2012-04-12 DIAGNOSIS — J449 Chronic obstructive pulmonary disease, unspecified: Secondary | ICD-10-CM

## 2012-04-12 DIAGNOSIS — Z781 Physical restraint status: Secondary | ICD-10-CM | POA: Diagnosis not present

## 2012-04-12 DIAGNOSIS — J398 Other specified diseases of upper respiratory tract: Secondary | ICD-10-CM | POA: Diagnosis present

## 2012-04-12 DIAGNOSIS — J9602 Acute respiratory failure with hypercapnia: Secondary | ICD-10-CM

## 2012-04-12 DIAGNOSIS — E785 Hyperlipidemia, unspecified: Secondary | ICD-10-CM | POA: Diagnosis present

## 2012-04-12 DIAGNOSIS — R197 Diarrhea, unspecified: Secondary | ICD-10-CM | POA: Diagnosis not present

## 2012-04-12 DIAGNOSIS — E46 Unspecified protein-calorie malnutrition: Secondary | ICD-10-CM | POA: Diagnosis present

## 2012-04-12 DIAGNOSIS — R5381 Other malaise: Secondary | ICD-10-CM | POA: Diagnosis not present

## 2012-04-12 DIAGNOSIS — G9340 Encephalopathy, unspecified: Secondary | ICD-10-CM

## 2012-04-12 DIAGNOSIS — Z23 Encounter for immunization: Secondary | ICD-10-CM

## 2012-04-12 DIAGNOSIS — J690 Pneumonitis due to inhalation of food and vomit: Secondary | ICD-10-CM | POA: Diagnosis present

## 2012-04-12 DIAGNOSIS — F17200 Nicotine dependence, unspecified, uncomplicated: Secondary | ICD-10-CM | POA: Diagnosis present

## 2012-04-12 DIAGNOSIS — J441 Chronic obstructive pulmonary disease with (acute) exacerbation: Secondary | ICD-10-CM | POA: Diagnosis present

## 2012-04-12 DIAGNOSIS — T783XXA Angioneurotic edema, initial encounter: Secondary | ICD-10-CM | POA: Diagnosis not present

## 2012-04-12 DIAGNOSIS — T457X5A Adverse effect of anticoagulant antagonists, vitamin K and other coagulants, initial encounter: Secondary | ICD-10-CM | POA: Diagnosis not present

## 2012-04-12 DIAGNOSIS — I9589 Other hypotension: Secondary | ICD-10-CM | POA: Diagnosis present

## 2012-04-12 DIAGNOSIS — T80218A Other infection due to central venous catheter, initial encounter: Secondary | ICD-10-CM | POA: Diagnosis not present

## 2012-04-12 DIAGNOSIS — E871 Hypo-osmolality and hyponatremia: Secondary | ICD-10-CM | POA: Diagnosis not present

## 2012-04-12 DIAGNOSIS — J1100 Influenza due to unidentified influenza virus with unspecified type of pneumonia: Secondary | ICD-10-CM | POA: Diagnosis present

## 2012-04-12 DIAGNOSIS — I509 Heart failure, unspecified: Secondary | ICD-10-CM

## 2012-04-12 DIAGNOSIS — Z930 Tracheostomy status: Secondary | ICD-10-CM

## 2012-04-12 DIAGNOSIS — N390 Urinary tract infection, site not specified: Secondary | ICD-10-CM | POA: Diagnosis not present

## 2012-04-12 DIAGNOSIS — I2699 Other pulmonary embolism without acute cor pulmonale: Secondary | ICD-10-CM

## 2012-04-12 DIAGNOSIS — D751 Secondary polycythemia: Secondary | ICD-10-CM

## 2012-04-12 DIAGNOSIS — E876 Hypokalemia: Secondary | ICD-10-CM | POA: Diagnosis not present

## 2012-04-12 DIAGNOSIS — J988 Other specified respiratory disorders: Secondary | ICD-10-CM | POA: Diagnosis present

## 2012-04-12 DIAGNOSIS — I824Y9 Acute embolism and thrombosis of unspecified deep veins of unspecified proximal lower extremity: Secondary | ICD-10-CM | POA: Diagnosis not present

## 2012-04-12 DIAGNOSIS — K560 Paralytic ileus: Secondary | ICD-10-CM | POA: Diagnosis not present

## 2012-04-12 DIAGNOSIS — IMO0002 Reserved for concepts with insufficient information to code with codable children: Secondary | ICD-10-CM | POA: Diagnosis not present

## 2012-04-12 DIAGNOSIS — Z7982 Long term (current) use of aspirin: Secondary | ICD-10-CM

## 2012-04-12 DIAGNOSIS — Z79899 Other long term (current) drug therapy: Secondary | ICD-10-CM

## 2012-04-12 DIAGNOSIS — Z8673 Personal history of transient ischemic attack (TIA), and cerebral infarction without residual deficits: Secondary | ICD-10-CM

## 2012-04-12 DIAGNOSIS — N179 Acute kidney failure, unspecified: Secondary | ICD-10-CM

## 2012-04-12 DIAGNOSIS — Z87891 Personal history of nicotine dependence: Secondary | ICD-10-CM

## 2012-04-12 DIAGNOSIS — K219 Gastro-esophageal reflux disease without esophagitis: Secondary | ICD-10-CM | POA: Diagnosis present

## 2012-04-12 DIAGNOSIS — I10 Essential (primary) hypertension: Secondary | ICD-10-CM | POA: Diagnosis present

## 2012-04-12 DIAGNOSIS — I4891 Unspecified atrial fibrillation: Secondary | ICD-10-CM

## 2012-04-12 DIAGNOSIS — E119 Type 2 diabetes mellitus without complications: Secondary | ICD-10-CM | POA: Diagnosis not present

## 2012-04-12 DIAGNOSIS — Y849 Medical procedure, unspecified as the cause of abnormal reaction of the patient, or of later complication, without mention of misadventure at the time of the procedure: Secondary | ICD-10-CM | POA: Diagnosis not present

## 2012-04-12 HISTORY — DX: Chronic obstructive pulmonary disease, unspecified: J44.9

## 2012-04-12 LAB — CBC WITH DIFFERENTIAL/PLATELET
Eosinophils Absolute: 0 10*3/uL (ref 0.0–0.7)
Hemoglobin: 20.1 g/dL — ABNORMAL HIGH (ref 13.0–17.0)
Lymphocytes Relative: 7 % — ABNORMAL LOW (ref 12–46)
Lymphs Abs: 0.7 10*3/uL (ref 0.7–4.0)
MCH: 31.8 pg (ref 26.0–34.0)
MCV: 97.8 fL (ref 78.0–100.0)
Monocytes Relative: 15 % — ABNORMAL HIGH (ref 3–12)
Neutrophils Relative %: 77 % (ref 43–77)
RBC: 6.32 MIL/uL — ABNORMAL HIGH (ref 4.22–5.81)
WBC: 9.9 10*3/uL (ref 4.0–10.5)

## 2012-04-12 LAB — POCT I-STAT 3, ART BLOOD GAS (G3+)
Acid-base deficit: 1 mmol/L (ref 0.0–2.0)
O2 Saturation: 100 %
TCO2: 34 mmol/L (ref 0–100)

## 2012-04-12 LAB — TROPONIN I: Troponin I: <0.3 ng/mL (ref <0.30)

## 2012-04-12 LAB — BASIC METABOLIC PANEL
BUN: 47 mg/dL — ABNORMAL HIGH (ref 6–23)
CO2: 32 mEq/L (ref 19–32)
GFR calc non Af Amer: 37 mL/min — ABNORMAL LOW (ref >90)
Glucose, Bld: 169 mg/dL — ABNORMAL HIGH (ref 70–99)
Potassium: 5.1 mEq/L (ref 3.5–5.1)

## 2012-04-12 LAB — URINALYSIS, ROUTINE W REFLEX MICROSCOPIC
Ketones, ur: NEGATIVE mg/dL
Nitrite: NEGATIVE
Protein, ur: 100 mg/dL — AB
Urobilinogen, UA: 1 mg/dL (ref 0.0–1.0)

## 2012-04-12 MED ORDER — ROCURONIUM BROMIDE 50 MG/5ML IV SOLN
INTRAVENOUS | Status: AC
  Filled 2012-04-12: qty 2

## 2012-04-12 MED ORDER — IPRATROPIUM BROMIDE 0.02 % IN SOLN
0.5000 mg | Freq: Once | RESPIRATORY_TRACT | Status: AC
  Administered 2012-04-12: 0.5 mg via RESPIRATORY_TRACT

## 2012-04-12 MED ORDER — PIPERACILLIN-TAZOBACTAM 3.375 G IVPB 30 MIN
3.3750 g | Freq: Once | INTRAVENOUS | Status: AC
  Administered 2012-04-12: 3.375 g via INTRAVENOUS
  Filled 2012-04-12: qty 50

## 2012-04-12 MED ORDER — SODIUM CHLORIDE 0.9 % IV BOLUS (SEPSIS)
1000.0000 mL | Freq: Once | INTRAVENOUS | Status: AC
  Administered 2012-04-12: 1000 mL via INTRAVENOUS

## 2012-04-12 MED ORDER — PROPOFOL 10 MG/ML IV EMUL: 5.0000 ug/kg/min | Freq: Once | INTRAVENOUS | Status: DC

## 2012-04-12 MED ORDER — ALBUTEROL SULFATE (5 MG/ML) 0.5% IN NEBU
INHALATION_SOLUTION | RESPIRATORY_TRACT | Status: AC
  Filled 2012-04-12: qty 0.5

## 2012-04-12 MED ORDER — MIDAZOLAM HCL 2 MG/2ML IJ SOLN
INTRAMUSCULAR | Status: AC
  Administered 2012-04-12: 2 mg via INTRAVENOUS
  Filled 2012-04-12: qty 2

## 2012-04-12 MED ORDER — ALBUTEROL SULFATE (5 MG/ML) 0.5% IN NEBU
INHALATION_SOLUTION | RESPIRATORY_TRACT | Status: AC
  Filled 2012-04-12: qty 1

## 2012-04-12 MED ORDER — DEXTROSE 5 % IV SOLN
500.0000 mg | INTRAVENOUS | Status: DC
  Administered 2012-04-12 – 2012-04-14 (×2): 500 mg via INTRAVENOUS
  Filled 2012-04-12 (×3): qty 500

## 2012-04-12 MED ORDER — IPRATROPIUM BROMIDE 0.02 % IN SOLN
RESPIRATORY_TRACT | Status: AC
  Filled 2012-04-12: qty 2.5

## 2012-04-12 MED ORDER — FUROSEMIDE 10 MG/ML IJ SOLN
40.0000 mg | Freq: Once | INTRAMUSCULAR | Status: AC
  Administered 2012-04-12: 40 mg via INTRAVENOUS
  Filled 2012-04-12: qty 4

## 2012-04-12 MED ORDER — PROPOFOL 10 MG/ML IV EMUL
5.0000 ug/kg/min | Freq: Once | INTRAVENOUS | Status: DC
  Administered 2012-04-12: 25 ug/kg/min via INTRAVENOUS

## 2012-04-12 MED ORDER — MIDAZOLAM HCL 2 MG/2ML IJ SOLN
4.0000 mg | Freq: Once | INTRAMUSCULAR | Status: AC
  Administered 2012-04-12: 4 mg via INTRAVENOUS

## 2012-04-12 MED ORDER — ALBUTEROL SULFATE (5 MG/ML) 0.5% IN NEBU
5.0000 mg | INHALATION_SOLUTION | Freq: Once | RESPIRATORY_TRACT | Status: AC
  Administered 2012-04-12: 5 mg via RESPIRATORY_TRACT

## 2012-04-12 MED ORDER — MIDAZOLAM HCL 2 MG/2ML IJ SOLN
INTRAMUSCULAR | Status: AC
  Filled 2012-04-12: qty 4

## 2012-04-12 MED ORDER — METHYLPREDNISOLONE SODIUM SUCC 125 MG IJ SOLR
125.0000 mg | Freq: Once | INTRAMUSCULAR | Status: AC
  Administered 2012-04-12: 125 mg via INTRAVENOUS
  Filled 2012-04-12: qty 2

## 2012-04-12 MED ORDER — IPRATROPIUM BROMIDE 0.02 % IN SOLN
0.5000 mg | Freq: Once | RESPIRATORY_TRACT | Status: AC
  Administered 2012-04-12: 0.5 mg via RESPIRATORY_TRACT
  Filled 2012-04-12: qty 2.5

## 2012-04-12 MED ORDER — ALBUTEROL SULFATE (5 MG/ML) 0.5% IN NEBU
2.5000 mg | INHALATION_SOLUTION | Freq: Once | RESPIRATORY_TRACT | Status: AC
  Administered 2012-04-12: 2.5 mg via RESPIRATORY_TRACT
  Filled 2012-04-12: qty 0.5

## 2012-04-12 MED ORDER — MIDAZOLAM HCL 2 MG/2ML IJ SOLN
2.0000 mg | Freq: Once | INTRAMUSCULAR | Status: AC
  Administered 2012-04-12: 2 mg via INTRAVENOUS

## 2012-04-12 MED ORDER — LIDOCAINE HCL (CARDIAC) 20 MG/ML IV SOLN
INTRAVENOUS | Status: AC
  Filled 2012-04-12: qty 5

## 2012-04-12 MED ORDER — SODIUM CHLORIDE 0.9 % IV SOLN
1.0000 mg/h | INTRAVENOUS | Status: DC
  Administered 2012-04-12: 2 mg/h via INTRAVENOUS
  Administered 2012-04-13: 1 mg/h via INTRAVENOUS
  Filled 2012-04-12 (×2): qty 10

## 2012-04-12 MED ORDER — PROPOFOL 10 MG/ML IV EMUL
INTRAVENOUS | Status: AC
  Filled 2012-04-12: qty 100

## 2012-04-12 MED ORDER — PHENYLEPHRINE HCL 10 MG/ML IJ SOLN
30.0000 ug/min | Freq: Once | INTRAMUSCULAR | Status: AC
  Administered 2012-04-12: 30 ug/min via INTRAVENOUS
  Filled 2012-04-12: qty 1

## 2012-04-12 MED ORDER — ETOMIDATE 2 MG/ML IV SOLN
INTRAVENOUS | Status: AC
  Administered 2012-04-12: 20 mg
  Filled 2012-04-12: qty 20

## 2012-04-12 MED ORDER — ALBUTEROL SULFATE (5 MG/ML) 0.5% IN NEBU
2.5000 mg | INHALATION_SOLUTION | Freq: Once | RESPIRATORY_TRACT | Status: AC
  Administered 2012-04-12: 2.5 mg via RESPIRATORY_TRACT

## 2012-04-12 MED ORDER — SUCCINYLCHOLINE CHLORIDE 20 MG/ML IJ SOLN
INTRAMUSCULAR | Status: AC
  Administered 2012-04-12: 125 mg
  Filled 2012-04-12: qty 1

## 2012-04-12 MED ORDER — MIDAZOLAM HCL 5 MG/5ML IJ SOLN
4.0000 mg | Freq: Once | INTRAMUSCULAR | Status: AC
  Administered 2012-04-12: 4 mg via INTRAVENOUS

## 2012-04-12 NOTE — ED Provider Notes (Deleted)
History     CSN: [id number]  Arrival date & time 04/12/12  1739   First MD Initiated Contact with Patient 04/12/12 1750      Chief Complaint  Patient presents with  . Shortness of Breath    (Consider location/radiation/quality/duration/timing/severity/associated sxs/prior treatment) HPI  Past Medical History  Diagnosis Date  . Stroke   . Hypertension   . Hyperlipidemia   . GERD (gastroesophageal reflux disease)   . COPD (chronic obstructive pulmonary disease)     History reviewed. No pertinent past surgical history.  Family History  Problem Relation Age of Onset  . COPD Mother     History  Substance Use Topics  . Smoking status: Former Smoker -- 0.5 packs/day    Types: Cigarettes  . Smokeless tobacco: Never Used  . Alcohol Use: No      Review of Systems  Allergies  Review of patient's allergies indicates no known allergies.  Home Medications   Current Outpatient Rx  Name  Route  Sig  Dispense  Refill  . AMLODIPINE BESYLATE 5 MG PO TABS   Oral   Take 1 tablet (5 mg total) by mouth daily.   90 tablet   3   . ASPIRIN 81 MG PO TABS   Oral   Take 81 mg by mouth daily.           . BUDESONIDE-FORMOTEROL FUMARATE 160-4.5 MCG/ACT IN AERO   Inhalation   Inhale 2 puffs into the lungs 2 (two) times daily.   1 Inhaler   3   . HYDROCHLOROTHIAZIDE 25 MG PO TABS   Oral   Take 1 tablet (25 mg total) by mouth daily.   90 tablet   3   . IBUPROFEN-DIPHENHYDRAMINE HCL 200-25 MG PO CAPS   Oral   Take 1 capsule by mouth at bedtime as needed. For sleep         . LISINOPRIL 40 MG PO TABS   Oral   Take 1 tablet (40 mg total) by mouth daily.   90 tablet   3     BP 116/82  Pulse 88  Temp 99.9 F (37.7 C) (Core (Comment))  Resp 18  Ht 5\' 8"  (1.727 m)  SpO2 92%  Physical Exam  ED Course  INTUBATION Performed by: Audelia Hives Authorized by: Preston Fleeting, DAVID Consent: The procedure was performed in an emergent situation. Indications: respiratory  distress and respiratory failure Intubation method: video-assisted Patient status: paralyzed (RSI) Preoxygenation: BVM Sedatives: etomidate Paralytic: succinylcholine Laryngoscope size: Mac 4 Tube size: 7.5 mm Tube type: cuffed Number of attempts: 1 Cricoid pressure: yes Cords visualized: yes Post-procedure assessment: chest rise and ETCO2 monitor Breath sounds: equal Cuff inflated: yes ETT to lip: 23 cm Tube secured with: ETT holder Chest x-ray interpreted by me. Chest x-ray findings: endotracheal tube in appropriate position Patient tolerance: Patient tolerated the procedure well with no immediate complications.   (including critical care time)  Labs Reviewed  CBC WITH DIFFERENTIAL - Abnormal; Notable for the following:    RBC 6.32 (*)     Hemoglobin 20.1 (*)     HCT 61.8 (*)     RDW 18.2 (*)     Platelets 147 (*)     Lymphocytes Relative 7 (*)     Monocytes Relative 15 (*)     Monocytes Absolute 1.5 (*)     All other components within normal limits  BASIC METABOLIC PANEL - Abnormal; Notable for the following:    Sodium 130 (*)  Chloride 89 (*)     Glucose, Bld 169 (*)     BUN 47 (*)     Creatinine, Ser 1.84 (*)     GFR calc non Af Amer 37 (*)     GFR calc Af Amer 43 (*)     All other components within normal limits  PRO B NATRIURETIC PEPTIDE - Abnormal; Notable for the following:    Pro B Natriuretic peptide (BNP) 17911.0 (*)     All other components within normal limits  URINALYSIS, ROUTINE W REFLEX MICROSCOPIC - Abnormal; Notable for the following:    Color, Urine AMBER (*)  BIOCHEMICALS MAY BE AFFECTED BY COLOR   APPearance CLOUDY (*)     Bilirubin Urine SMALL (*)     Protein, ur 100 (*)     All other components within normal limits  POCT I-STAT 3, BLOOD GAS (G3+) - Abnormal; Notable for the following:    pH, Arterial 7.182 (*)     pCO2 arterial 83.4 (*)     pO2, Arterial 260.0 (*)     Bicarbonate 31.3 (*)     All other components within normal limits   URINE MICROSCOPIC-ADD ON - Abnormal; Notable for the following:    Casts HYALINE CASTS (*)     All other components within normal limits  TROPONIN I  INFLUENZA PANEL BY PCR  URINALYSIS, MICROSCOPIC ONLY  URINE CULTURE  CULTURE, BLOOD (ROUTINE X 2)  CULTURE, BLOOD (ROUTINE X 2)  BLOOD GAS, ARTERIAL   Dg Chest Port 1 View  04/12/2012  *RADIOLOGY REPORT*  Clinical Data: Central line insertion.  Respiratory distress.  PORTABLE CHEST - 1 VIEW  Comparison: Multiple  priors earlier today.  Findings: ET tube remains satisfactory position 5.5 cm above carina.  Left internal jugular central line has been placed and lies with its tip in the proximal SVC.  There is no pneumothorax. Stable bilateral interstitial opacities are worse on the left. Cardiomegaly.  IMPRESSION: Left IJ central line lies with its tip in the proximal SVC.  No pneumothorax.  Stable aeration.   Original Report Authenticated By: Davonna Belling, M.D.    Dg Chest Portable 1 View  04/12/2012  *RADIOLOGY REPORT*  Clinical Data: Intubation.  PORTABLE CHEST - 1 VIEW  Comparison: 04/12/2012 at to 6:13 p.m.  Findings: An endotracheal tube is in placed with tip 6 cm above the carina.  Nasogastric tube tip is in the stomach with side port at the gastroesophageal junction.  Stable appearance of cardiomegaly and diffuse interstitial opacity. Stable mild hilar prominence.  IMPRESSION:  1.  Endotracheal tube tip 6 cm above the carina. 2.  Nasogastric tube tip in the stomach. 3.  Stable bilateral interstitial opacities.   Original Report Authenticated By: Gaylyn Rong, M.D.    Dg Chest Portable 1 View  04/12/2012  *RADIOLOGY REPORT*  Clinical Data: Shortness of breath.  PORTABLE CHEST - 1 VIEW  Comparison: None.  Findings: Diffuse interstitial opacity noted with indistinct pulmonary vasculature.  Mild cardiomegaly is observed.  Mild bilateral hilar prominence noted.  IMPRESSION:  1.  Mild cardiomegaly with diffuse interstitial opacity, potentially  representing acute pulmonary edema or atypical pneumonia. 2.  Mild bilateral hilar prominence.  Below this may be vascular, I cannot exclude hilar adenopathy.   Original Report Authenticated By: Gaylyn Rong, M.D.      1. Acute respiratory failure   2. CHF (congestive heart failure)   3. COPD (chronic obstructive pulmonary disease)   4. Renal insufficiency   5.  Polycythemia       MDM  Intubation note        Audelia Hives, MD 04/12/12 [phone]

## 2012-04-12 NOTE — ED Notes (Signed)
Patient aspirated brown emesis.

## 2012-04-12 NOTE — H&P (Signed)
PULMONARY  / CRITICAL CARE MEDICINE  Name: Cody [name] MRN: [id number] DOB: 28-Mar-1951    LOS: 0  REFERRING MD :  ED--Dr. Preston Fleeting  CHIEF COMPLAINT:  Shortness of breath  BRIEF PATIENT DESCRIPTION: Cody [name] is a 62 year old male with hx of COPD presenting with complaints of worsening shortness of breath x4 days associated with productive cough, occasional hemoptysis, and increased confustion.  Arrived in ED in respiratory distress with o2 sat 60% on RA.  Placed on NRB initially.  He became increasingly agitated and restless and combative with staff with increased confusion and refusing treatments.  Eventually intubated 04/13/11, aspirated brown emesis, LIJ placed in ED.  Admitted for acute respiratory failure.   LINES / TUBES: 04/13/11 LIJ 04/13/11 peripheral IV x2 (R and L) 04/13/11 Foley 04/13/11 ETT OGT  CULTURES: 04/13/11 Urine cx 04/13/11 Sputum cx 04/13/11 Blood cx x2   ANTIBIOTICS: 04/13/11: Zosyn 04/13/11: Azithromycin  SIGNIFICANT EVENTS:  04/13/11: Acute respiratory failure requirting ETT.  Hypotension--LIJ placed  LEVEL OF CARE: ICU PRIMARY SERVICE:  PCCM CONSULTANTS:  none CODE STATUS: Full DIET:  NPO DVT Px:  Lovenox GI Px:  Protonix  HISTORY OF PRESENT ILLNESS:  Cody [name] is a 62 year old white male with PMH of COPD, 3 pack per day smoking history 20+ years, and HTN presenting to the ED today with his fiance for complaints of worsening shortness of breath x4 days.  History obtained by fiance.  Cody [name] has baseline shortness of breath especially on exertion, however, for the past 4 days he has been having increased difficulty and has also developed a worsening productive cough with white sputum and occasionally streaked with bright red blood.  His fiance claims today he just could not breat at all and was increasingly confused and extremely light headed when he stands.  She denies him to have any headaches, fevers, LOC,  N/V/D, chest pain, abdominal pain, or any urinary  complaints.    In the ED, he became increasingly agitated, confused, and desaturated, was eventually intubated, noted to be hypotensive but responding to fluids (3L bolus), and LIJ placed.    PAST MEDICAL HISTORY :  Past Medical History  Diagnosis Date  . Stroke   . Hypertension   . Hyperlipidemia   . GERD (gastroesophageal reflux disease)   . COPD (chronic obstructive pulmonary disease)    History reviewed. No pertinent past surgical history. Prior to Admission medications   Medication Sig Start Date End Date Taking? Authorizing Provider  amLODipine (NORVASC) 5 MG tablet Take 1 tablet (5 mg total) by mouth daily. 02/21/12  Yes Lindley Magnus, MD  aspirin 81 MG tablet Take 81 mg by mouth daily.     Yes Historical Provider, MD  budesonide-formoterol (SYMBICORT) 160-4.5 MCG/ACT inhaler Inhale 2 puffs into the lungs 2 (two) times daily. 06/18/11 06/17/12 Yes Bruce Romilda Garret, MD  hydrochlorothiazide (HYDRODIURIL) 25 MG tablet Take 1 tablet (25 mg total) by mouth daily. 02/28/12 02/27/13 Yes Terressa Koyanagi, DO  Ibuprofen-Diphenhydramine HCl (ADVIL PM) 200-25 MG CAPS Take 1 capsule by mouth at bedtime as needed. For sleep   Yes Historical Provider, MD  lisinopril (PRINIVIL,ZESTRIL) 40 MG tablet Take 1 tablet (40 mg total) by mouth daily. 09/09/11 09/08/12 Yes Bruce Romilda Garret, MD   No Known Allergies  FAMILY HISTORY:  Family History  Problem Relation Age of Onset  . COPD Mother    SOCIAL HISTORY:  reports that he has quit smoking. His smoking use included Cigarettes. He  smoked .5 packs per day. He has never used smokeless tobacco. He reports that he does not drink alcohol or use illicit drugs.  REVIEW OF SYSTEMS:  Unable to be obtained as pt is intubated on mechanical ventilation and sedated.   INTERVAL HISTORY:   VITAL SIGNS: Temp:  [97.9 F (36.6 C)-99.5 F (37.5 C)] 99.1 F (37.3 C) (01/05 2140) Pulse Rate:  [72-115] 81  (01/05 2140) Resp:  [14-28] 18  (01/05 2140) BP: (65-128)/(48-89)  96/73 mmHg (01/05 2140) SpO2:  [60 %-99 %] 91 % (01/05 2140) FiO2 (%):  [60 %-100 %] 60 % (01/05 2015) HEMODYNAMICS:   VENTILATOR SETTINGS: Vent Mode:  [-] PRVC FiO2 (%):  [60 %-100 %] 60 % Set Rate:  [20 bmp] 20 bmp Vt Set:  [500 mL] 500 mL PEEP:  [5 cmH20] 5 cmH20 Plateau Pressure:  [25 cmH20] 25 cmH20 INTAKE / OUTPUT: Intake/Output    None     PHYSICAL EXAMINATION: General:  Intubated, sedated Neuro:  sedated HEENT:  PERRLA, Conjunctival erythema, NGT in place. LIJ Cardiovascular:  RRR Lungs:  Coarse breath sounds b/l, b/l anterior wheezing Abdomen:  Distended, soft, +bs Musculoskeletal:  -edema b/l, faint distal pulses Skin:  Cool, dry  LABS: Cbc  Lab 04/12/12 1833  WBC 9.9  HGB 20.1*  HCT 61.8*  PLT 147*   Chemistry   Lab 04/12/12 1833  NA 130*  K 5.1  CL 89*  CO2 32  BUN 47*  CREATININE 1.84*  CALCIUM 8.7  MG --  PHOS --  GLUCOSE 169*   Cardiac markers  Lab 04/12/12 1932  CKTOTAL --  CKMB --  TROPONINI <0.30   BNP  Lab 04/12/12 1832  PROBNP 17911.0*   ABG  Lab 04/12/12 2013  PHART 7.182*  PCO2ART 83.4*  PO2ART 260.0*  HCO3 31.3*  TCO2 34   IMAGING: CXR 04/13/11: 1. Mild cardiomegaly with diffuse interstitial opacity, potentially representing acute pulmonary edema or atypical pneumonia. 2. Mild bilateral hilar prominence. Below this may be vascular, cannot exclude hilar adenopathy.  Repeat cxr 04/13/11: 1. Endotracheal tube tip 6 cm above the carina. 2. Nasogastric tube tip in the stomach. 3. Stable bilateral interstitial opacities.  ECG: 108 bpm sinus tachycardia, R axis deviation, t wave inversions leads III, V1-V5  DIAGNOSES: Principal Problem:  *Acute respiratory failure Active Problems:  Acute encephalopathy  Acute kidney injury  HYPERLIPIDEMIA  TOBACCO USE  HYPERTENSION  GERD  COPD (chronic obstructive pulmonary disease)  ASSESSMENT / PLAN:  PULMONARY  ASSESSMENT:  1) Acute hypercapnic respiratory failure secondary  to COPD exacerbation 2) Aspiration at time of intubation 3) Pneumonitis vs. CAP--b/l interstitial opacities on cxr 4) Hx of COPD--with chronic tobacco abuse hx 3 pack per day 20+ years.  On symbicort at home.  PLAN:   -ETT on mechanical ventilation. PRVC, Vt: 8cckg, PEEP: 5, RR: 18, FiO2 60% and adjust to keep O2 sat >94% -sputum cx, blood cx x2 -urinary antigens: strep pneumoniae and legionella -IV abx coverage for presumed CAP with aspiration (poor denture)--Zosyn and Azithromycin -duonebz q4 scheduled and q2 prn -IV solumedrol 80mg  qd; received IV solumedrol 125mg  in ED -AM cxr -Repeat ABG -Influenza pcr  CARDIOVASCULAR  ASSESSMENT:  1) Hypotension--likely secondary to sedation. On propofol during intubation.  Was also given Lasix 40mg  x1 in ED.  Propofol discontinued, transitioned to versed. BP responded to fluids.  2) Elevated proBNP--likely diastolic heart failure secondary to uncontrolled hypertension.  proBNP on admission 17911. Trop x1 negative 3) Hx of HTN--on home regimen Lisinopril  40mg , Norvasc 5mg , and HCTZ 25mg  qd  PLAN:  -IVF--monitor CVP -2D Echo -hold home HTN medication -consider neosynephrine if hypotension continues -cycle CE -AM EKG  RENAL  ASSESSMENT:  1) Acute Kidney injury--likely pre-renal in setting of volume depletion. Cr on admission 1.84 PLAN:   -IVF -AM renal function panel -continue to monitor  GASTROINTESTINAL  ASSESSMENT:  Hx of GERD.  PLAN:   -GI Ppx: Protonix  HEMATOLOGIC  ASSESSMENT: 1) Polycythemia--Hb 20.1 on admission, possibly secondary to combined polycythemia in setting of chronic tobacco abuse and secondary polycythemia in response to hypoxia PLAN:  -AM cbc with diff -continue to monitor  INFECTIOUS  ASSESSMENT:   1) Presumed CAP--diffuse interstitial opacities on cxr and worsening shortness of breath 2) Aspirated at time of intubation, pneumonitis?  PLAN:   -will cover for CAP and give anaerobic coverage given  aspiration: Azithromycin and Zosyn -f/u blood cx x2 -f/u sputum cx -f/u urine cx -f/u urinary antigens strep pneumo and legionella  ENDOCRINE  ASSESSMENT:  On steroids this admission    PLAN:   -f/u HbA1c -CBG monitoring BID -IV solumedrol -ICU Hyperglycemic protocol  NEUROLOGIC  ASSESSMENT:  Acute encephalopathy--likely secondary to hypoxia / hypercarbia and possible infectious process (CAP?).   PLAN:   -sedated on mechanical ventilation -continue IV abx -continue to monitor  CLINICAL SUMMARY: Cody [name] is a 62 year old male with hx of COPD presenting with complaints of worsening shortness of breath x4 days associated with productive cough, occasional hemoptysis, and increased confustion.  Arrived in ED in respiratory distress with o2 sat 60% on RA.  Eventually intubated 04/13/11, aspirated brown emesis, LIJ placed in ED.  Admitted for acute respiratory failure.   I have personally obtained a history, examined the patient, evaluated laboratory and imaging results, formulated the assessment and plan and placed orders. CRITICAL CARE: The patient is critically ill with multiple organ systems failure and requires high complexity decision making for assessment and support, frequent evaluation and titration of therapies, application of advanced monitoring technologies and extensive interpretation of multiple databases. Critical Care Time devoted to patient care services described in this note is 60 minutes.   Darden Palmer, MD PGY-1 Internal Medicine   04/12/2012 11:09 PM  Overton Mam, M.D. Pulmonary and Critical Care Medicine [facility] Pager: [phone]  04/13/2012, 1:43 AM

## 2012-04-12 NOTE — ED Provider Notes (Signed)
History     CSN: [id number]  Arrival date & time 04/12/12  1739   First MD Initiated Contact with Patient 04/12/12 1750      Chief Complaint  Patient presents with  . Shortness of Breath    (Consider location/radiation/quality/duration/timing/severity/associated sxs/prior treatment) Patient is a 62 y.o. male presenting with shortness of breath. The history is provided by the patient and the spouse.  Shortness of Breath  Associated symptoms include shortness of breath.  He has a history of COPD and has been having breathing problems for last several years. This got significantly worse over the last 3 days and much worse today. He has had a cough productive of white sputum with blood streaks. There's been no fever, chills, sweats. He denies chest pain. Denies nausea or vomiting. Dyspnea is worse with any exertion. Nothing makes it better. He did not get a flu shot this year. Of note, he has Symbicort inhaler but it does not have a rescue inhaler. He states he has never had an attack as severe as this one to  Past Medical History  Diagnosis Date  . Stroke   . Hypertension   . Hyperlipidemia   . GERD (gastroesophageal reflux disease)     No past surgical history on file.  Family History  Problem Relation Age of Onset  . COPD Mother     History  Substance Use Topics  . Smoking status: Current Every Day Smoker -- 0.5 packs/day    Types: Cigarettes  . Smokeless tobacco: Never Used  . Alcohol Use: No      Review of Systems  Respiratory: Positive for shortness of breath.   All other systems reviewed and are negative.    Allergies  Review of patient's allergies indicates no known allergies.  Home Medications   Current Outpatient Rx  Name  Route  Sig  Dispense  Refill  . AMLODIPINE BESYLATE 5 MG PO TABS   Oral   Take 1 tablet (5 mg total) by mouth daily.   90 tablet   3   . ASPIRIN 81 MG PO TABS   Oral   Take 81 mg by mouth daily.           .  BUDESONIDE-FORMOTEROL FUMARATE 160-4.5 MCG/ACT IN AERO   Inhalation   Inhale 2 puffs into the lungs 2 (two) times daily.   1 Inhaler   3   . HYDROCHLOROTHIAZIDE 25 MG PO TABS   Oral   Take 1 tablet (25 mg total) by mouth daily.   90 tablet   3   . LISINOPRIL 40 MG PO TABS   Oral   Take 1 tablet (40 mg total) by mouth daily.   90 tablet   3   . TADALAFIL 20 MG PO TABS   Oral   Take 20 mg by mouth. Take one pill every 3 days as needed            There were no vitals taken for this visit.  Physical Exam  Nursing note and vitals reviewed. 62 year old male, in mild to moderate respiratory distress. Vital signs are significant for tachycardia with heart rate 102, hypertension with blood pressure 180/112, and tachypnea with respiratory rate of 28. Oxygen saturation is 60%, which is hypoxic. Head is normocephalic and atraumatic. PERRLA, EOMI. Oropharynx is clear. Neck is nontender and supple without adenopathy or JVD. Back is nontender and there is no CVA tenderness. Lungs are diffuse wheezes without rales or rhonchi. Chest is nontender.  Heart has regular rate and rhythm without murmur. Abdomen is soft, flat, nontender without masses or hepatosplenomegaly and peristalsis is normoactive. Extremities have no cyanosis or edema, full range of motion is present. Skin is warm and dry without rash. Neurologic: Mental status is normal, cranial nerves are intact, there are no motor or sensory deficits.   ED Course  Procedures (including critical care time)  Results for orders placed during the hospital encounter of 04/12/12  CBC WITH DIFFERENTIAL      Component Value Range   WBC 9.9  4.0 - 10.5 K/uL   RBC 6.32 (*) 4.22 - 5.81 MIL/uL   Hemoglobin 20.1 (*) 13.0 - 17.0 g/dL   HCT 40.9 (*) 81.1 - 91.4 %   MCV 97.8  78.0 - 100.0 fL   MCH 31.8  26.0 - 34.0 pg   MCHC 32.5  30.0 - 36.0 g/dL   RDW 78.2 (*) 95.6 - 21.3 %   Platelets 147 (*) 150 - 400 K/uL   Neutrophils Relative 77  43  - 77 %   Neutro Abs 7.6  1.7 - 7.7 K/uL   Lymphocytes Relative 7 (*) 12 - 46 %   Lymphs Abs 0.7  0.7 - 4.0 K/uL   Monocytes Relative 15 (*) 3 - 12 %   Monocytes Absolute 1.5 (*) 0.1 - 1.0 K/uL   Eosinophils Relative 0  0 - 5 %   Eosinophils Absolute 0.0  0.0 - 0.7 K/uL   Basophils Relative 1  0 - 1 %   Basophils Absolute 0.1  0.0 - 0.1 K/uL  BASIC METABOLIC PANEL      Component Value Range   Sodium 130 (*) 135 - 145 mEq/L   Potassium 5.1  3.5 - 5.1 mEq/L   Chloride 89 (*) 96 - 112 mEq/L   CO2 32  19 - 32 mEq/L   Glucose, Bld 169 (*) 70 - 99 mg/dL   BUN 47 (*) 6 - 23 mg/dL   Creatinine, Ser 0.86 (*) 0.50 - 1.35 mg/dL   Calcium 8.7  8.4 - 57.8 mg/dL   GFR calc non Af Amer 37 (*) >90 mL/min   GFR calc Af Amer 43 (*) >90 mL/min  PRO B NATRIURETIC PEPTIDE      Component Value Range   Pro B Natriuretic peptide (BNP) 17911.0 (*) 0 - 125 pg/mL  TROPONIN I      Component Value Range   Troponin I <0.30  <0.30 ng/mL  URINALYSIS, ROUTINE W REFLEX MICROSCOPIC      Component Value Range   Color, Urine AMBER (*) YELLOW   APPearance CLOUDY (*) CLEAR   Specific Gravity, Urine 1.023  1.005 - 1.030   pH 5.0  5.0 - 8.0   Glucose, UA NEGATIVE  NEGATIVE mg/dL   Hgb urine dipstick NEGATIVE  NEGATIVE   Bilirubin Urine SMALL (*) NEGATIVE   Ketones, ur NEGATIVE  NEGATIVE mg/dL   Protein, ur 469 (*) NEGATIVE mg/dL   Urobilinogen, UA 1.0  0.0 - 1.0 mg/dL   Nitrite NEGATIVE  NEGATIVE   Leukocytes, UA NEGATIVE  NEGATIVE  POCT I-STAT 3, BLOOD GAS (G3+)      Component Value Range   pH, Arterial 7.182 (*) 7.350 - 7.450   pCO2 arterial 83.4 (*) 35.0 - 45.0 mmHg   pO2, Arterial 260.0 (*) 80.0 - 100.0 mmHg   Bicarbonate 31.3 (*) 20.0 - 24.0 mEq/L   TCO2 34  0 - 100 mmol/L   O2 Saturation 100.0  Acid-base deficit 1.0  0.0 - 2.0 mmol/L   Collection site RADIAL, Cody [name] ACCEPTABLE     Drawn by Operator     Sample type ARTERIAL     Comment NOTIFIED PHYSICIAN    URINE MICROSCOPIC-ADD ON       Component Value Range   WBC, UA 0-2  <3 WBC/hpf   Casts HYALINE CASTS (*) NEGATIVE   Dg Chest Portable 1 View  04/12/2012  *RADIOLOGY REPORT*  Clinical Data: Intubation.  PORTABLE CHEST - 1 VIEW  Comparison: 04/12/2012 at to 6:13 p.m.  Findings: An endotracheal tube is in placed with tip 6 cm above the carina.  Nasogastric tube tip is in the stomach with side port at the gastroesophageal junction.  Stable appearance of cardiomegaly and diffuse interstitial opacity. Stable mild hilar prominence.  IMPRESSION:  1.  Endotracheal tube tip 6 cm above the carina. 2.  Nasogastric tube tip in the stomach. 3.  Stable bilateral interstitial opacities.   Original Report Authenticated By: Gaylyn Rong, M.D.    Dg Chest Portable 1 View  04/12/2012  *RADIOLOGY REPORT*  Clinical Data: Shortness of breath.  PORTABLE CHEST - 1 VIEW  Comparison: None.  Findings: Diffuse interstitial opacity noted with indistinct pulmonary vasculature.  Mild cardiomegaly is observed.  Mild bilateral hilar prominence noted.  IMPRESSION:  1.  Mild cardiomegaly with diffuse interstitial opacity, potentially representing acute pulmonary edema or atypical pneumonia. 2.  Mild bilateral hilar prominence.  Below this may be vascular, I cannot exclude hilar adenopathy.   Original Report Authenticated By: Gaylyn Rong, M.D.       Date: 04/12/2012  Rate: 108  Rhythm: sinus tachycardia  QRS Axis: right  Intervals: normal  ST/T Wave abnormalities: nonspecific T wave changes  Conduction Disutrbances:none  Narrative Interpretation: Sinus tachycardia with nonspecific T wave changes, no prior ECG available for comparison.  Old EKG Reviewed: none available    1. Acute respiratory failure   2. CHF (congestive heart failure)   3. COPD (chronic obstructive pulmonary disease)   4. Renal insufficiency   5. Polycythemia       MDM  Acute exacerbation of COPD which may be secondary to bronchitis, pneumonia, or influenza. Chest x-ray has  been ordered and he is started on albuterol with Atrovent nebulizers and methylprednisolone intravenously. He was noted to be cyanotic at triage but improved significantly with supplemental oxygen.  After his first albuterol with Atrovent and nebulizer, he noted significant subjective improvement, but still significant wheezing and was using accessory muscles of respiration. Albuterol and Atrovent will be repeated.  After second albuterol with Atrovent nebulizer treatment, he will have been doing well but suddenly became agitated and more dyspneic and somewhat confused. On exam, there is significantly more bronchospasm and he seems to be using accessory muscles somewhat more. BiPAP has been ordered and he will get a third albuterol with Atrovent nebulizer treatment. ABG has been ordered.  Patient became too agitated and would not accept BiPAP. He was sedated with Versed and move to a resuscitation room where he was intubated. He will need to be admitted to pulmonary/critical care. Laboratory evaluation is come back showing severe polycythemia and also very high BNP indicating at least a large component, if not major component of congestive heart failure. Case is discussed with Dr. Rayna Sexton of pulmonary critical care who agrees to admit the patient.  CRITICAL CARE Performed by: Dione Booze   Total critical care time: 100 minutes  Critical care time was exclusive of separately billable procedures  and treating other patients.  Critical care was necessary to treat or prevent imminent or life-threatening deterioration.  Critical care was time spent personally by me on the following activities: development of treatment plan with patient and/or surrogate as well as nursing, discussions with consultants, evaluation of patient's response to treatment, examination of patient, obtaining history from patient or surrogate, ordering and performing treatments and interventions, ordering and review of laboratory  studies, ordering and review of radiographic studies, pulse oximetry and re-evaluation of patient's condition.       Dione Booze, MD 04/12/12 [phone]

## 2012-04-12 NOTE — Progress Notes (Signed)
Vent changes made per MD order. 500, r 18, 60%, PEEP 5.

## 2012-04-12 NOTE — ED Notes (Signed)
Called pharmacy and advised we need the neo and Versed drips stat.

## 2012-04-12 NOTE — ED Notes (Signed)
Pt took out IV; stated that he was told he was discharge "that lady said I could go home"; asked the pt who the lady was and he stated "i don't know"; RN informed the pt that we were waiting on his test results to come back; pt stated he did not know; talked the pt into allowing [place] to continue caring for him; pt stated he would stay; O2 sats dropped down into the 70's when pt was attempting to leave; non-rebreather placed on pt; O2 sats now in the upper 90's; wife at bedside, feels pt is becoming restless;

## 2012-04-12 NOTE — ED Notes (Signed)
Pt reports difficulty breathing that has progressively gotten worse over the past 2 years; pt was recently diagnosed with COPD; states that he has a symbicort inhaler at home but not a rescue inhaler; pt stated that over the past few days he has increasingly gotten weaker and has been having a lot of shortness of breath with a strong productive cough with white phlegm along with some blood tinged sputum; pt also reports some confusion that has developed over the past couple of days; pt is alert and oriented x4; upon arrival to ED, pt O2 sats were in the 60's on room air and pt appeared cyanotic; non-rebreather placed on pt, pts O2 sats 97% and pts color returned;

## 2012-04-12 NOTE — ED Notes (Signed)
Per Dr. Virgina Organ, hol NaCl bolus.  Try to adjust BP using ventilator.

## 2012-04-12 NOTE — Procedures (Signed)
Arterial Catheter Insertion Procedure Note Cody [name] [id number] March 27, 1951  Procedure: Insertion of Arterial Catheter  Indications: Blood pressure monitoring and Frequent blood sampling  Procedure Details Consent: Risks of procedure as well as the alternatives and risks of each were explained to the (patient/caregiver).  Consent for procedure obtained. Time Out: Verified patient identification, verified procedure, site/side was marked, verified correct patient position, special equipment/implants available, medications/allergies/relevent history reviewed, required imaging and test results available.  Performed  Maximum sterile technique was used including antiseptics, cap, gloves, gown, hand hygiene, mask and sheet. Skin prep: Chlorhexidine; local anesthetic administered 20 gauge catheter was inserted into right radial artery using the Seldinger technique.  Evaluation Blood flow good; BP tracing good. Complications: No apparent complications.   Cody [name], Cody [name] 04/12/2012

## 2012-04-12 NOTE — ED Notes (Addendum)
Pt arrived to nurse first cyanotic with c/o sob.  Initial o2 sats 60% .  Pt placed on NRB. Charge RN notified of pt and Pod A RNs and EMTs  took pt back to treatment room.

## 2012-04-12 NOTE — ED Notes (Addendum)
Pt continuing to become increasingly restless and agitated; pt has removed leads, blood pressure cuff and pulse ox; pt attempted to remove IV but RN convinced pt to allow [place] to leave it in; pt has become combative with staff; pt stating he wanted to go home; when asked if he knew where he was "i'm home"; Dr. Preston Fleeting made aware of pts increasing confusion; Dr. Preston Fleeting came to bedside to talk to pt; pt had taken off non-rebreather and refusing to allow respiratory to give nebulizer treatment; Dr. Preston Fleeting discussed with wife about intubation; wife verbalized understanding; pts skin was starting to become increasingly red; pts respirations were uneven and labored; when pt allowed [place] to hook him back up to monitor oxygen saturation found to be 44%; ambu bag obtained and pt was bagged on way to Trauma C; pt face noted to become blue;

## 2012-04-12 NOTE — ED Notes (Signed)
Per Dr. Marry Guan, the patient is receiving a central line on an emergent basis (without having consents signed), as he is sedated and no one with POA is present.

## 2012-04-12 NOTE — Progress Notes (Signed)
ANTIBIOTIC CONSULT NOTE - INITIAL  Pharmacy Consult for zosyn + azithromycin Indication: CAP, aspiration PNA  No Known Allergies  Patient Measurements: Height: 5\' 8"  (172.7 cm) IBW/kg (Calculated) : 68.4   Vital Signs: Temp: 99.1 F (37.3 C) (01/05 2140) Temp src: Core (Comment) (01/05 2011) BP: 96/73 mmHg (01/05 2140) Pulse Rate: 81  (01/05 2140) Intake/Output from previous day:   Intake/Output from this shift:    Labs:  [facility] 04/12/12 1833  WBC 9.9  HGB 20.1*  PLT 147*  LABCREA --  CREATININE 1.84*   The CrCl is unknown because both a height and weight (above a minimum accepted value) are required for this calculation. No results found for this basename: VANCOTROUGH:2,VANCOPEAK:2,VANCORANDOM:2,GENTTROUGH:2,GENTPEAK:2,GENTRANDOM:2,TOBRATROUGH:2,TOBRAPEAK:2,TOBRARND:2,AMIKACINPEAK:2,AMIKACINTROU:2,AMIKACIN:2, in the last 72 hours   Microbiology: No results found for this or any previous visit (from the past 720 hour(s)).  Medical History: Past Medical History  Diagnosis Date  . Stroke   . Hypertension   . Hyperlipidemia   . GERD (gastroesophageal reflux disease)   . COPD (chronic obstructive pulmonary disease)     Medications:  Anti-infectives     Start     Dose/Rate Route Frequency Ordered Stop   04/12/12 2200   piperacillin-tazobactam (ZOSYN) IVPB 3.375 g        3.375 g 100 mL/hr over 30 Minutes Intravenous  Once 04/12/12 2148     04/12/12 2145   azithromycin (ZITHROMAX) 500 mg in dextrose 5 % 250 mL IVPB        500 mg 250 mL/hr over 60 Minutes Intravenous Every 24 hours 04/12/12 2143           Assessment: 61 yom with history of COPD presented to the ED with SOB. Pt is afebrile and WBC is WNL. Starting on empiric antibiotics for CAP and possible aspiration during intubation.   Goal of Therapy:  Eradication of infection  Plan:  1. Zosyn 3.375gm IV Q8H (4 hr inf) 2. Continue azithromycin as ordered by MD 3. F/u renal fxn, C&S, clinical  status  Emmerich Cryer, Drake Leach 04/12/2012,9:50 PM

## 2012-04-13 ENCOUNTER — Ambulatory Visit: Payer: Self-pay | Admitting: Internal Medicine

## 2012-04-13 ENCOUNTER — Inpatient Hospital Stay (HOSPITAL_COMMUNITY): Payer: Medicaid Other

## 2012-04-13 ENCOUNTER — Telehealth: Payer: Self-pay | Admitting: Internal Medicine

## 2012-04-13 DIAGNOSIS — J9600 Acute respiratory failure, unspecified whether with hypoxia or hypercapnia: Principal | ICD-10-CM

## 2012-04-13 DIAGNOSIS — I517 Cardiomegaly: Secondary | ICD-10-CM

## 2012-04-13 DIAGNOSIS — N179 Acute kidney failure, unspecified: Secondary | ICD-10-CM

## 2012-04-13 DIAGNOSIS — I509 Heart failure, unspecified: Secondary | ICD-10-CM

## 2012-04-13 DIAGNOSIS — R042 Hemoptysis: Secondary | ICD-10-CM

## 2012-04-13 DIAGNOSIS — G9340 Encephalopathy, unspecified: Secondary | ICD-10-CM

## 2012-04-13 LAB — INFLUENZA PANEL BY PCR (TYPE A & B)
H1N1 flu by pcr: NOT DETECTED
Influenza A By PCR: NEGATIVE
Influenza B By PCR: NEGATIVE
Influenza B By PCR: NEGATIVE

## 2012-04-13 LAB — COMPREHENSIVE METABOLIC PANEL
AST: 19 U/L (ref 0–37)
Albumin: 2.7 g/dL — ABNORMAL LOW (ref 3.5–5.2)
Alkaline Phosphatase: 60 U/L (ref 39–117)
BUN: 49 mg/dL — ABNORMAL HIGH (ref 6–23)
Chloride: 94 mEq/L — ABNORMAL LOW (ref 96–112)
Potassium: 4.6 mEq/L (ref 3.5–5.1)
Sodium: 135 mEq/L (ref 135–145)
Total Bilirubin: 0.4 mg/dL (ref 0.3–1.2)
Total Protein: 5.7 g/dL — ABNORMAL LOW (ref 6.0–8.3)

## 2012-04-13 LAB — BLOOD GAS, ARTERIAL
Bicarbonate: 31.5 mEq/L — ABNORMAL HIGH (ref 20.0–24.0)
Drawn by: 13898
MECHVT: 500 mL
O2 Saturation: 92.4 %
PEEP: 5 cmH2O
Patient temperature: 98.6
RATE: 18 resp/min
pH, Arterial: 7.24 — ABNORMAL LOW (ref 7.350–7.450)

## 2012-04-13 LAB — CBC WITH DIFFERENTIAL/PLATELET
Basophils Absolute: 0 10*3/uL (ref 0.0–0.1)
Eosinophils Relative: 0 % (ref 0–5)
HCT: 57.5 % — ABNORMAL HIGH (ref 39.0–52.0)
Hemoglobin: 18.2 g/dL — ABNORMAL HIGH (ref 13.0–17.0)
Lymphocytes Relative: 8 % — ABNORMAL LOW (ref 12–46)
Lymphs Abs: 0.7 10*3/uL (ref 0.7–4.0)
MCV: 96.6 fL (ref 78.0–100.0)
Monocytes Absolute: 0.6 10*3/uL (ref 0.1–1.0)
Monocytes Relative: 7 % (ref 3–12)
Neutro Abs: 7.8 10*3/uL — ABNORMAL HIGH (ref 1.7–7.7)
RDW: 18.1 % — ABNORMAL HIGH (ref 11.5–15.5)
WBC: 9.2 10*3/uL (ref 4.0–10.5)

## 2012-04-13 LAB — URINE MICROSCOPIC-ADD ON

## 2012-04-13 LAB — URINALYSIS, ROUTINE W REFLEX MICROSCOPIC
Bilirubin Urine: NEGATIVE
Glucose, UA: NEGATIVE mg/dL
Protein, ur: NEGATIVE mg/dL

## 2012-04-13 LAB — POCT I-STAT 3, ART BLOOD GAS (G3+)
Bicarbonate: 37.2 mEq/L — ABNORMAL HIGH (ref 20.0–24.0)
O2 Saturation: 99 %
Patient temperature: 98.9
TCO2: 40 mmol/L (ref 0–100)
pCO2 arterial: 83.9 mmHg (ref 35.0–45.0)

## 2012-04-13 LAB — MAGNESIUM: Magnesium: 2.1 mg/dL (ref 1.5–2.5)

## 2012-04-13 LAB — CARBOXYHEMOGLOBIN
Carboxyhemoglobin: 2.4 % — ABNORMAL HIGH (ref 0.5–1.5)
Methemoglobin: 1.2 % (ref 0.0–1.5)
O2 Saturation: 81 %

## 2012-04-13 LAB — RENAL FUNCTION PANEL
BUN: 50 mg/dL — ABNORMAL HIGH (ref 6–23)
CO2: 31 mEq/L (ref 19–32)
Calcium: 7.6 mg/dL — ABNORMAL LOW (ref 8.4–10.5)
Chloride: 96 mEq/L (ref 96–112)
Creatinine, Ser: 1.57 mg/dL — ABNORMAL HIGH (ref 0.50–1.35)
GFR calc non Af Amer: 45 mL/min — ABNORMAL LOW (ref >90)
Glucose, Bld: 174 mg/dL — ABNORMAL HIGH (ref 70–99)

## 2012-04-13 LAB — CORTISOL: Cortisol, Plasma: 24.4 ug/dL

## 2012-04-13 LAB — GLUCOSE, CAPILLARY
Glucose-Capillary: 173 mg/dL — ABNORMAL HIGH (ref 70–99)
Glucose-Capillary: 179 mg/dL — ABNORMAL HIGH (ref 70–99)

## 2012-04-13 LAB — TROPONIN I
Troponin I: <0.3 ng/mL (ref <0.30)
Troponin I: <0.3 ng/mL (ref <0.30)

## 2012-04-13 LAB — HEMOGLOBIN A1C: Mean Plasma Glucose: 146 mg/dL — ABNORMAL HIGH (ref <117)

## 2012-04-13 MED ORDER — OXEPA PO LIQD
1000.0000 mL | ORAL | Status: DC
  Filled 2012-04-13 (×2): qty 1000

## 2012-04-13 MED ORDER — ENOXAPARIN SODIUM 40 MG/0.4ML ~~LOC~~ SOLN
40.0000 mg | SUBCUTANEOUS | Status: DC
  Administered 2012-04-13: 40 mg via SUBCUTANEOUS
  Filled 2012-04-13: qty 0.4

## 2012-04-13 MED ORDER — INSULIN ASPART 100 UNIT/ML ~~LOC~~ SOLN
1.0000 [IU] | SUBCUTANEOUS | Status: DC
  Administered 2012-04-13: 3 [IU] via SUBCUTANEOUS
  Administered 2012-04-13 (×5): 2 [IU] via SUBCUTANEOUS
  Administered 2012-04-14: 3 [IU] via SUBCUTANEOUS
  Administered 2012-04-14: 2 [IU] via SUBCUTANEOUS
  Administered 2012-04-14: 1 [IU] via SUBCUTANEOUS
  Administered 2012-04-14: 3 [IU] via SUBCUTANEOUS
  Administered 2012-04-14: 1 [IU] via SUBCUTANEOUS
  Administered 2012-04-14: 2 [IU] via SUBCUTANEOUS

## 2012-04-13 MED ORDER — PIPERACILLIN-TAZOBACTAM 3.375 G IVPB
3.3750 g | Freq: Three times a day (TID) | INTRAVENOUS | Status: AC
  Administered 2012-04-13 – 2012-04-16 (×12): 3.375 g via INTRAVENOUS
  Filled 2012-04-13 (×12): qty 50

## 2012-04-13 MED ORDER — CHLORHEXIDINE GLUCONATE 0.12 % MT SOLN
15.0000 mL | Freq: Two times a day (BID) | OROMUCOSAL | Status: DC
  Administered 2012-04-13 (×3): 15 mL via OROMUCOSAL
  Filled 2012-04-13 (×5): qty 15

## 2012-04-13 MED ORDER — CHLORHEXIDINE GLUCONATE 0.12 % MT SOLN
15.0000 mL | Freq: Two times a day (BID) | OROMUCOSAL | Status: DC
  Administered 2012-04-13 – 2012-04-22 (×18): 15 mL via OROMUCOSAL
  Filled 2012-04-13 (×14): qty 15

## 2012-04-13 MED ORDER — HEPARIN SODIUM (PORCINE) 5000 UNIT/ML IJ SOLN
5000.0000 [IU] | Freq: Three times a day (TID) | INTRAMUSCULAR | Status: DC
  Administered 2012-04-13 – 2012-04-24 (×34): 5000 [IU] via SUBCUTANEOUS
  Filled 2012-04-13 (×40): qty 1

## 2012-04-13 MED ORDER — PRO-STAT SUGAR FREE PO LIQD
60.0000 mL | Freq: Three times a day (TID) | ORAL | Status: DC
  Administered 2012-04-13 – 2012-04-20 (×21): 60 mL
  Filled 2012-04-13 (×24): qty 60

## 2012-04-13 MED ORDER — PANTOPRAZOLE SODIUM 40 MG IV SOLR
40.0000 mg | Freq: Every day | INTRAVENOUS | Status: DC
  Administered 2012-04-13: 40 mg via INTRAVENOUS
  Filled 2012-04-13 (×2): qty 40

## 2012-04-13 MED ORDER — VANCOMYCIN HCL 1000 MG IV SOLR
750.0000 mg | Freq: Two times a day (BID) | INTRAVENOUS | Status: DC
  Administered 2012-04-13: 750 mg via INTRAVENOUS
  Filled 2012-04-13 (×3): qty 750

## 2012-04-13 MED ORDER — ENOXAPARIN SODIUM 30 MG/0.3ML ~~LOC~~ SOLN: 30.0000 mg | SUBCUTANEOUS | Status: DC

## 2012-04-13 MED ORDER — SODIUM CHLORIDE 0.9 % IV SOLN
1500.0000 mg | Freq: Once | INTRAVENOUS | Status: AC
  Administered 2012-04-13: 1500 mg via INTRAVENOUS
  Filled 2012-04-13: qty 1500

## 2012-04-13 MED ORDER — BIOTENE DRY MOUTH MT LIQD
15.0000 mL | Freq: Four times a day (QID) | OROMUCOSAL | Status: DC
  Administered 2012-04-13: 15 mL via OROMUCOSAL

## 2012-04-13 MED ORDER — SODIUM CHLORIDE 0.9 % IV SOLN
1.0000 mg/h | INTRAVENOUS | Status: DC
  Administered 2012-04-13 (×2): 5 mg/h via INTRAVENOUS
  Administered 2012-04-13: 4 mg/h via INTRAVENOUS
  Administered 2012-04-14: 6 mg/h via INTRAVENOUS
  Administered 2012-04-14: 7 mg/h via INTRAVENOUS
  Administered 2012-04-14: 5 mg/h via INTRAVENOUS
  Filled 2012-04-13 (×6): qty 10

## 2012-04-13 MED ORDER — ALBUTEROL SULFATE (5 MG/ML) 0.5% IN NEBU
2.5000 mg | INHALATION_SOLUTION | RESPIRATORY_TRACT | Status: DC
  Administered 2012-04-13 (×3): 2.5 mg via RESPIRATORY_TRACT
  Filled 2012-04-13 (×2): qty 0.5

## 2012-04-13 MED ORDER — OSELTAMIVIR PHOSPHATE 6 MG/ML PO SUSR
150.0000 mg | Freq: Two times a day (BID) | ORAL | Status: AC
  Administered 2012-04-13 – 2012-04-22 (×20): 150 mg via ORAL
  Filled 2012-04-13 (×22): qty 25

## 2012-04-13 MED ORDER — FENTANYL CITRATE 0.05 MG/ML IJ SOLN
50.0000 ug | INTRAMUSCULAR | Status: DC | PRN
  Administered 2012-04-13: 50 ug via INTRAVENOUS
  Administered 2012-04-13 – 2012-04-14 (×4): 100 ug via INTRAVENOUS
  Filled 2012-04-13: qty 4
  Filled 2012-04-13 (×4): qty 2

## 2012-04-13 MED ORDER — ALBUTEROL SULFATE (5 MG/ML) 0.5% IN NEBU
2.5000 mg | INHALATION_SOLUTION | RESPIRATORY_TRACT | Status: DC | PRN
  Administered 2012-04-20 – 2012-05-04 (×3): 2.5 mg via RESPIRATORY_TRACT
  Filled 2012-04-13 (×2): qty 0.5

## 2012-04-13 MED ORDER — METHYLPREDNISOLONE SODIUM SUCC 125 MG IJ SOLR
60.0000 mg | Freq: Every day | INTRAMUSCULAR | Status: DC
  Filled 2012-04-13: qty 0.96

## 2012-04-13 MED ORDER — ALBUTEROL SULFATE (5 MG/ML) 0.5% IN NEBU
INHALATION_SOLUTION | RESPIRATORY_TRACT | Status: AC
  Filled 2012-04-13: qty 0.5

## 2012-04-13 MED ORDER — ETOMIDATE 2 MG/ML IV SOLN
INTRAVENOUS | Status: AC
  Filled 2012-04-13: qty 10

## 2012-04-13 MED ORDER — BIOTENE DRY MOUTH MT LIQD
15.0000 mL | Freq: Four times a day (QID) | OROMUCOSAL | Status: DC
  Administered 2012-04-13 – 2012-04-22 (×38): 15 mL via OROMUCOSAL

## 2012-04-13 MED ORDER — MIDAZOLAM HCL 2 MG/2ML IJ SOLN: 2.0000 mg | INTRAMUSCULAR | Status: DC | PRN

## 2012-04-13 MED ORDER — OXEPA PO LIQD
1000.0000 mL | ORAL | Status: DC
  Administered 2012-04-13 – 2012-04-19 (×3): 1000 mL
  Filled 2012-04-13 (×11): qty 1000

## 2012-04-13 MED ORDER — IPRATROPIUM BROMIDE 0.02 % IN SOLN
0.5000 mg | RESPIRATORY_TRACT | Status: DC
  Administered 2012-04-13 (×3): 0.5 mg via RESPIRATORY_TRACT
  Filled 2012-04-13: qty 2.5

## 2012-04-13 MED ORDER — IPRATROPIUM BROMIDE 0.02 % IN SOLN
0.5000 mg | RESPIRATORY_TRACT | Status: DC | PRN
  Administered 2012-04-20 – 2012-04-22 (×2): 0.5 mg via RESPIRATORY_TRACT
  Filled 2012-04-13 (×4): qty 2.5

## 2012-04-13 MED ORDER — IPRATROPIUM BROMIDE 0.02 % IN SOLN
RESPIRATORY_TRACT | Status: AC
  Filled 2012-04-13: qty 2.5

## 2012-04-13 MED ORDER — NOREPINEPHRINE BITARTRATE 1 MG/ML IJ SOLN
2.0000 ug/min | INTRAVENOUS | Status: DC
  Administered 2012-04-13: 10 ug/min via INTRAVENOUS
  Administered 2012-04-13: 5 ug/min via INTRAVENOUS
  Filled 2012-04-13 (×3): qty 4

## 2012-04-13 MED ORDER — CHLORHEXIDINE GLUCONATE 0.12 % MT SOLN
OROMUCOSAL | Status: AC
  Filled 2012-04-13: qty 15

## 2012-04-13 MED ORDER — SODIUM CHLORIDE 0.9 % IV SOLN
250.0000 mL | INTRAVENOUS | Status: DC | PRN
  Administered 2012-04-19 – 2012-04-25 (×2): 250 mL via INTRAVENOUS
  Administered 2012-04-28: 20 mL/h via INTRAVENOUS

## 2012-04-13 MED ORDER — METHYLPREDNISOLONE SODIUM SUCC 125 MG IJ SOLR
80.0000 mg | Freq: Every day | INTRAMUSCULAR | Status: DC
  Administered 2012-04-13: 80 mg via INTRAVENOUS
  Filled 2012-04-13: qty 1.28

## 2012-04-13 MED ORDER — ETOMIDATE 2 MG/ML IV SOLN
INTRAVENOUS | Status: AC
  Administered 2012-04-13: 20 mg
  Filled 2012-04-13: qty 10

## 2012-04-13 MED ORDER — ASPIRIN 81 MG PO CHEW: 324.0000 mg | CHEWABLE_TABLET | ORAL | Status: AC

## 2012-04-13 MED ORDER — IPRATROPIUM-ALBUTEROL 18-103 MCG/ACT IN AERO
6.0000 | INHALATION_SPRAY | RESPIRATORY_TRACT | Status: DC
  Administered 2012-04-13 – 2012-04-22 (×55): 6 via RESPIRATORY_TRACT
  Filled 2012-04-13 (×2): qty 14.7

## 2012-04-13 MED ORDER — MIDAZOLAM BOLUS VIA INFUSION
1.0000 mg | INTRAVENOUS | Status: DC | PRN
  Filled 2012-04-13: qty 2

## 2012-04-13 MED ORDER — SODIUM CHLORIDE 0.9 % IV SOLN
INTRAVENOUS | Status: DC
  Administered 2012-04-13: 02:00:00 via INTRAVENOUS

## 2012-04-13 MED ORDER — ASPIRIN 300 MG RE SUPP
300.0000 mg | RECTAL | Status: AC
  Administered 2012-04-13: 300 mg via RECTAL
  Filled 2012-04-13: qty 1

## 2012-04-13 NOTE — Progress Notes (Signed)
Name: Cody [name] MRN: [id number] DOB: May 23, 1950 LOS: 1  PCCM RESIDENT DAILY PROGRESS NOTE  History of Present Illness:  Cody [name] is a 62 year old white male with PMH of COPD, 3 pack per day smoking history 20+ years, and HTN presenting acute rsp failure, COPD exac, atypical PNA, aspiration durign ett.  Lines / Drains: ETT>>1/5 L IJ>>1/5 R A-line>>1/5  Cultures: Blood culture 1/5>> Resp culture 1/6>> Sputum 1/6>> Urine 1/5>> Flu 1/5>>  Antibiotics: Zosyn 1/5>> Azithro 1/5>> vanc 1/6>>> Flu 1/6>>>  Tests / Events: Intubated in ED 1/5 Shock , levophed 1/5  Vital Signs: Temp:  [97.9 F (36.6 C)-100 F (37.8 C)] 99.7 F (37.6 C) (01/06 0600) Pulse Rate:  [72-115] 87  (01/06 0600) Resp:  [14-28] 18  (01/06 0600) BP: (65-130)/(48-89) 92/64 mmHg (01/06 0600) SpO2:  [60 %-99 %] 94 % (01/06 0600) FiO2 (%):  [60 %-100 %] 60 % (01/06 0600) Weight:  [231 lb 7.7 oz (105 kg)] 231 lb 7.7 oz (105 kg) (01/06 0039) I/O last 3 completed shifts: In: 600.4 [I.V.:562.9; IV Piggyback:37.5] Out: 1270 [Urine:1270]  Physical Examination: General:  Intubated and sedated Neuro:  Agitated with arousal  HEENT:  ETT in place  Cardiovascular:  Sinus rhythm Lungs:  Course breath sounds bilaterally Abdomen:  Soft, distended, +BS Musculoskeletal:  No edema   Ventilator settings: Vent Mode:  [-] PRVC FiO2 (%):  [60 %-100 %] 60 % Set Rate:  [18 bmp-20 bmp] 18 bmp Vt Set:  [500 mL] 500 mL PEEP:  [5 cmH20] 5 cmH20 Plateau Pressure:  [17 cmH20-25 cmH20] 18 cmH20 @arterialblood @ Labs and Imaging:   Basic Metabolic Panel:  Lab 04/13/12 7829 04/12/12 1833  NA 137135 130*  K 4.84.6 5.1  CL 9694* 89*  CO2 3131 32  GLUCOSE 174*174* 169*  BUN 50*49* 47*  CREATININE 1.57*1.53* 1.84*  CALCIUM 7.6*7.5* 8.7  MG 2.1 --  PHOS 3.9 --   Liver Function Tests:  Lab 04/13/12 0407  AST 19  ALT 16  ALKPHOS 60  BILITOT 0.4  PROT 5.7*  ALBUMIN 2.7*2.7*   CBC:  Lab  04/13/12 0407 04/12/12 1833  WBC 9.2 9.9  NEUTROABS 7.8* 7.6  HGB 18.2* 20.1*  HCT 57.5* 61.8*  MCV 96.6 97.8  PLT 135* 147*   Cardiac Enzymes:  Lab 04/13/12 0407 04/13/12 0155 04/12/12 1932  CKTOTAL -- -- --  CKMB -- -- --  CKMBINDEX -- -- --  TROPONINI <0.30 <0.30 <0.30   BNP:  Lab 04/12/12 1832  PROBNP 17911.0*   CBG:  Lab 04/13/12 0404 04/13/12 0029  GLUCAP 175* 231*   Hemoglobin A1C: No results found for this basename: HGBA1C in the last 168 hours Fasting Lipid Panel: No results found for this basename: CHOL,HDL,LDLCALC,TRIG,CHOLHDL,LDLDIRECT in the last 562 hours Thyroid Function Tests: No results found for this basename: TSH,T4TOTAL,FREET4,T3FREE,THYROIDAB in the last 168 hours Coagulation: No results found for this basename: LABPROT:4,INR:4 in the last 168 hours Anemia Panel: No results found for this basename: VITAMINB12,FOLATE,FERRITIN,TIBC,IRON,RETICCTPCT in the last 168 hours  Urinalysis:  Lab 04/13/12 0033 04/12/12 1957  COLORURINE YELLOW AMBER*  LABSPEC 1.016 1.023  PHURINE 5.0 5.0  GLUCOSEU NEGATIVE NEGATIVE  HGBUR SMALL* NEGATIVE  BILIRUBINUR NEGATIVE SMALL*  KETONESUR NEGATIVE NEGATIVE  PROTEINUR NEGATIVE 100*  UROBILINOGEN 0.2 1.0  NITRITE NEGATIVE NEGATIVE  LEUKOCYTESUR NEGATIVE NEGATIVE      Lab 04/13/12 0155  LATICACIDVEN 1.6  PROCALCITON 0.29    Assessment and Plan: PULMONARY  ASSESSMENT: Long time smoker with COPD, worsening of sx  over the last few days. Intubated in ED with aspiration. Likely COPD with CHF exac 1) Acute hypercapnic respiratory failure secondary to COPD exacerbation  2) Aspiration at time of intubation  3) Pneumonitis vs. CAP--b/l interstitial opacities on cxr  4) Hx of COPD--with chronic tobacco abuse hx 3 pack per day 20+ years. On symbicort at home, r/o fibrosis 5) ARDS   6) some bloody secretions from aspiration? DAH? R/o pulm / renal syndrome PLAN:  -add ards protocol, increase rr to max 24 with copd,  abg to follow -goal to 7 cc/kg, eval plat pressures -duonebz q4 scheduled and q2 prn  -IV solumedrol 60 mg -Bronch this afternoon, r/o DAH -pcxr in am -see ID  CARDIOVASCULAR  ASSESSMENT:  1) Hypotension--septic shock likely 2) Elevated proBNP--likely diastolic heart failure secondary to uncontrolled hypertension and acute illness. proBNP on admission 17911. Trop x3 negative  3) Hx of HTN-- home regimen Lisinopril 40mg , Norvasc 5mg , and HCTZ 25mg  qd  PLAN:  -IVF--monitor CVP 10 = ards -2D Echo pending -hold home HTN medication  -Cont Levo to map goal -f/u EKG  -EGDT not done as thought from asp, sedation, but remains: out of window -svo2 x 1 -steroids  RENAL  ASSESSMENT:  1) Acute Kidney injury--likely pre-renal in setting of volume depletion. Cr on admission 1.84, improving. On NS@75   2) r/o pulm renal syndrome PLAN:  -IVF  -am BMP  -UA with some reds, do bronch , if DAH, then autoimmune work up -cvp 10   GASTROINTESTINAL  ASSESSMENT: Hx of GERD.  PLAN:  -NPO, start TF after bronch -GI PPx: Protonix   HEMATOLOGIC  ASSESSMENT:  1) Polycythemia--Hb 20.1 on admission, possibly secondary to combined polycythemia in setting of chronic tobacco abuse and secondary polycythemia in response to hypoxia. Hgb improving.  No leukocytosis.  PLAN:  -AM cbc  -continue to monitor  Hydration Dc lovenox in setting of bloody secretions, sub q hep  INFECTIOUS  ASSESSMENT:  1) Presumed CAP--diffuse interstitial opacities on cxr and worsening shortness of breath  2) Aspirated at time of intubation, pneumonitis? R/o cap vs flu, r/o DAH PLAN:  -continue zosyn, azithro, add vanc -add tamiflu, asses flu ag -bronch bal   ENDOCRINE  ASSESSMENT: On steroids this admission. CBGs elevated.  PLAN:  -Continue SSI -CBG monitoring BID  -Continue IV solumedrol  -Cont ICU Hyperglycemic protocol   NEUROLOGIC  ASSESSMENT: Acute encephalopathy--likely secondary to hypoxia / hypercarbia  and possible infectious process (CAP?).  PLAN:  -sedated on mechanical ventilation  -daily wua  CLINICAL SUMMARY: Cody [name] is a 62 year old male with hx of COPD presenting with complaints of worsening shortness of breath x4 days associated with productive cough, occasional hemoptysis, and increased confustion. R/o DAH, flu, cap. ARDS prot start, bronch for dah.  Best practices / Disposition: -->ICU status under PCCM -->full code -->Lovenox for DVT Px -->Protonix for GI Px -->ventilator bundle -->TF 1/6   Genelle Gather 04/13/2012, 7:50 AM  The patient is critically ill with multiple organ systems failure and requires high complexity decision making for assessment and support, frequent evaluation and titration of therapies, application of advanced monitoring technologies and extensive interpretation of multiple databases. Critical Care Time devoted to patient care services described in this note is 30 minutes.  [name]. [name], MD, FACP Pgr: [phone] [facility]

## 2012-04-13 NOTE — Progress Notes (Signed)
Hypotension   Start Levophed

## 2012-04-13 NOTE — Progress Notes (Signed)
INITIAL NUTRITION ASSESSMENT  DOCUMENTATION CODES Per approved criteria  -Not Applicable   INTERVENTION:  Initiate TF via OG tube with Oxepa at 15 ml/h, increase by 10 ml every 4 hours to goal rate of 45 ml/h with Prostat 60 ml TID to provide 2220 kcals, 158 gm protein, 848 ml free water daily.  NUTRITION DIAGNOSIS: Inadequate oral intake related to inability to eat as evidenced by NPO status.   Goal: Intake to meet >90% of estimated nutrition needs.  Monitor:  TF tolerance, weight trend, labs, vent status  Reason for Assessment: MD Consult for TF initiation and management.  62 y.o. male  Admitting Dx: Acute respiratory failure with hypercapnia  ASSESSMENT: Patient presented with acute respiratory failure, COPD exacerbation, atypical PNA, aspiration during intubation, ARDS.  Patient is currently intubated on ventilator support.  MV: 9 Temp:Temp (24hrs), Avg:99.4 F (37.4 C), Min:97.9 F (36.6 C), Max:100 F (37.8 C)   Height: Ht Readings from Last 1 Encounters:  04/13/12 6\' 2"  (1.88 m)    Weight: Wt Readings from Last 1 Encounters:  04/13/12 231 lb 7.7 oz (105 kg)    Ideal Body Weight: 86.4 kg  % Ideal Body Weight: 122%  Wt Readings from Last 10 Encounters:  04/13/12 231 lb 7.7 oz (105 kg)  03/25/12 229 lb (103.874 kg)  02/28/12 226 lb (102.513 kg)  09/09/11 212 lb (96.163 kg)  08/05/11 214 lb (97.07 kg)  06/18/11 218 lb (98.884 kg)  09/19/10 210 lb (95.255 kg)  05/28/10 211 lb (95.709 kg)  04/24/09 219 lb (99.338 kg)  01/15/08 217 lb (98.431 kg)    Usual Body Weight: 226 lb  % Usual Body Weight: 102%  BMI:  Body mass index is 29.72 kg/(m^2).  Estimated Nutritional Needs: Kcal: 2225 Protein: 140 gm Fluid: 2.2-2.3 L  Skin: no problems noted  Diet Order: NPO  EDUCATION NEEDS: -Education not appropriate at this time   Intake/Output Summary (Last 24 hours) at 04/13/12 1458 Last data filed at 04/13/12 1300  Gross per 24 hour  Intake  1216.2 ml  Output   1570 ml  Net -353.8 ml    Labs:   Lab 04/13/12 0407 04/12/12 1833  NA 137135 130*  K 4.84.6 5.1  CL 9694* 89*  CO2 3131 32  BUN 50*49* 47*  CREATININE 1.57*1.53* 1.84*  CALCIUM 7.6*7.5* 8.7  MG 2.1 --  PHOS 3.9 --  GLUCOSE 174*174* 169*    CBG (last 3)   Basename 04/13/12 1207 04/13/12 0759 04/13/12 0404  GLUCAP 183* 178* 175*    Scheduled Meds:   . albuterol-ipratropium  6 puff Inhalation Q4H  . antiseptic oral rinse  15 mL Mouth Rinse QID  . antiseptic oral rinse  15 mL Mouth Rinse QID  . azithromycin  500 mg Intravenous Q24H  . chlorhexidine  15 mL Mouth Rinse BID  . chlorhexidine  15 mL Mouth Rinse BID  . feeding supplement (OXEPA)  1,000 mL Per Tube Q24H  . heparin  5,000 Units Subcutaneous Q8H  . insulin aspart  1-3 Units Subcutaneous Q4H  . methylPREDNISolone sodium succinate  60 mg Intravenous Daily  . oseltamivir  150 mg Oral BID  . pantoprazole (PROTONIX) IV  40 mg Intravenous QHS  . piperacillin-tazobactam (ZOSYN)  IV  3.375 g Intravenous Q8H  . vancomycin  1,500 mg Intravenous Once  . vancomycin  750 mg Intravenous Q12H    Continuous Infusions:   . sodium chloride 75 mL/hr at 04/13/12 0130  . midazolam (VERSED) infusion 1 mg/hr (  04/13/12 1404)  . midazolam (VERSED) infusion 5 mg/hr (04/13/12 0900)  . norepinephrine (LEVOPHED) Adult infusion 5 mcg/min (04/13/12 0500)    Past Medical History  Diagnosis Date  . Stroke   . Hypertension   . Hyperlipidemia   . GERD (gastroesophageal reflux disease)   . COPD (chronic obstructive pulmonary disease)     History reviewed. No pertinent past surgical history.  Joaquin Courts, RD, LDN, CNSC Pager# [phone] After Hours Pager# [phone]

## 2012-04-13 NOTE — Progress Notes (Signed)
*  PRELIMINARY RESULTS* Vascular Ultrasound Lower extremity venous duplex has been completed.  Preliminary findings: Bilateral:  No evidence of DVT, superficial thrombosis, or Baker's Cyst.    Farrel Demark, RDMS, RVT 04/13/2012, 12:19 PM

## 2012-04-13 NOTE — Telephone Encounter (Signed)
Pt is in ICU at [facility]. Took your advice and went to ED. They admitted him. Pt's wife would like you to call her. [phone]

## 2012-04-13 NOTE — Progress Notes (Signed)
ANTIBIOTIC CONSULT NOTE - INITIAL  Pharmacy Consult for Adding Vancomycin Indication: pneumonia  No Known Allergies  Patient Measurements: Height: 6\' 2"  (188 cm) Weight: 231 lb 7.7 oz (105 kg) IBW/kg (Calculated) : 82.2   Vital Signs: Temp: 99.5 F (37.5 C) (01/06 1300) Temp src: Core (Comment) (01/06 0800) BP: 104/67 mmHg (01/06 1300) Pulse Rate: 88  (01/06 1300) Intake/Output from previous day: 01/05 0701 - 01/06 0700 In: 600.4 [I.V.:562.9; IV Piggyback:37.5] Out: 1270 [Urine:1270] Intake/Output from this shift: Total I/O In: 615.8 [I.V.:590.8; IV Piggyback:25] Out: 300 [Urine:300]  Labs:  [facility] 04/13/12 0407 04/12/12 1833  WBC 9.2 9.9  HGB 18.2* 20.1*  PLT 135* 147*  LABCREA -- --  CREATININE 1.57*1.53* 1.84*   Estimated Creatinine Clearance: 62.2 ml/min (by C-G formula based on Cr of 1.57). No results found for this basename: VANCOTROUGH:2,VANCOPEAK:2,VANCORANDOM:2,GENTTROUGH:2,GENTPEAK:2,GENTRANDOM:2,TOBRATROUGH:2,TOBRAPEAK:2,TOBRARND:2,AMIKACINPEAK:2,AMIKACINTROU:2,AMIKACIN:2, in the last 72 hours   Microbiology: Recent Results (from the past 720 hour(s))  MRSA PCR SCREENING     Status: Normal   Collection Time   04/13/12 12:29 AM      Component Value Range Status Comment   MRSA by PCR NEGATIVE  NEGATIVE Final   CULTURE, RESPIRATORY     Status: Normal (Preliminary result)   Collection Time   04/13/12  4:46 AM      Component Value Range Status Comment   Specimen Description TRACHEAL ASPIRATE   Final    Special Requests Normal   Final    Gram Stain PENDING   Incomplete    Culture NO GROWTH   Final    Report Status PENDING   Incomplete     Medical History: Past Medical History  Diagnosis Date  . Stroke   . Hypertension   . Hyperlipidemia   . GERD (gastroesophageal reflux disease)   . COPD (chronic obstructive pulmonary disease)    Medications:  Anti-infectives     Start     Dose/Rate Route Frequency Ordered Stop   04/13/12 1400   oseltamivir  (TAMIFLU) 6 MG/ML suspension 150 mg        150 mg Oral 2 times daily 04/13/12 1327 04/18/12 0959   04/13/12 0600   piperacillin-tazobactam (ZOSYN) IVPB 3.375 g        3.375 g 12.5 mL/hr over 240 Minutes Intravenous 3 times per day 04/13/12 0102     04/12/12 2200   piperacillin-tazobactam (ZOSYN) IVPB 3.375 g        3.375 g 100 mL/hr over 30 Minutes Intravenous  Once 04/12/12 2148 04/12/12 2347   04/12/12 2145   azithromycin (ZITHROMAX) 500 mg in dextrose 5 % 250 mL IVPB        500 mg 250 mL/hr over 60 Minutes Intravenous Every 24 hours 04/12/12 2143           Assessment: 61 YOM on Zosyn and Azithromycin day #2 for r/o aspiration PNA and possible atypicals now to add Vancomycin for r/o HAP. Also on Tamiflu empirically while influenza is pending. Sputum and blood cultures pending.  Strep pneumo UA (-). Legionella pending. UOP low at ~0.5 cc/kg/hr.   WBC is wnl. Afebrile.   Goal of Therapy:  Vancomycin trough level 15-20 mcg/ml  Plan:  1. Vancomycin 1500mg  x1, then 500mg  IV q12h (slightly below goal due to low UOP).  2. Follow-up renal function, trough at Css. 3. Follow-up cultures and viral panel.  4. Continue Zosyn and Azithromycin at current doses.   Link Snuffer, PharmD, BCPS Clinical Pharmacist [phone] 04/13/2012,1:35 PM

## 2012-04-13 NOTE — Procedures (Signed)
Bronchoscopy Procedure Note Cody [name] [id number] 1950-09-14  Procedure: Bronchoscopy Indications: Diagnostic evaluation of the airways, Obtain specimens for culture and/or other diagnostic studies and Remove secretions  Procedure Details Consent: Risks of procedure as well as the alternatives and risks of each were explained to the (patient/caregiver).  Consent for procedure obtained. Time Out: Verified patient identification, verified procedure, site/side was marked, verified correct patient position, special equipment/implants available, medications/allergies/relevent history reviewed, required imaging and test results available.  Performed  In preparation for procedure, patient was given 100% FiO2 and bronchoscope lubricated. Sedation: Benzodiazepines and Etomidate  Airway entered and the following bronchi were examined: RUL, RML, RLL, LUL, LLL and Bronchi.   Procedures performed: Brushings performed Bronchoscope removed.    Evaluation Hemodynamic Status: BP stable throughout; O2 sats: stable throughout Patient's Current Condition: stable Specimens:  Sent purulent fluid Complications: No apparent complications Patient did tolerate procedure well.   [name]. 04/13/2012  1. Diffuse tan pus al lobes , moderate 2. NO DAH, mild echymosis carina, trachea, nonbleding 3. BAL Ling, RML, pus 4. midl tracheal malacia  [name]. [name], MD, FACP Pgr: [phone] [facility]

## 2012-04-13 NOTE — Progress Notes (Signed)
Resp Care Note; ETT advanced 2 cm per MD order from 23  to 25 @ lip.

## 2012-04-13 NOTE — Progress Notes (Signed)
Inpatient Diabetes Program Recommendations  AACE/ADA: New Consensus Statement on Inpatient Glycemic Control (2013)  Target Ranges:  Prepandial:   less than 140 mg/dL      Peak postprandial:   less than 180 mg/dL (1-2 hours)      Critically ill patients:  140 - 180 mg/dL   Reason for Visit: Note CBG's elevated.  HgBA1C=6.7% indicating CBG's likely elevated prior to admit.  Will follow.  Note tube feeds to start today, therefore may consider Novolog tube feed coverage 2 units q 4 hours.

## 2012-04-13 NOTE — Progress Notes (Signed)
Echocardiogram 2D Echocardiogram has been performed.  Cody [name] 04/13/2012, 10:20 AM

## 2012-04-13 NOTE — Progress Notes (Signed)
eLink Physician-Brief Progress Note Patient Name: Cody [name] DOB: 1950/09/06 MRN: [id number]  Date of Service  04/13/2012   HPI/Events of Note   Pt with ARDS   eICU Interventions  Start ARDS  Vent protocol   Intervention Category Major Interventions: Respiratory failure - evaluation and management  Shan Levans 04/13/2012, 3:23 PM

## 2012-04-13 NOTE — Progress Notes (Signed)
CRITICAL CARE RESIDENT NOTE Interim Progress Note  Subjective:    I went to check on Mr. Royall this evening and met his son, Minerva Areola, who claims to be the primary decision maker for his father at this time.  We discussed Mr. Ardizzone's current condition and I inquired about code status.  At this time, Minerva Areola claims his family has not had a formal discussion, however, he would like him to be full code at this time.  He does go on to say that the only discussion his father has had with him in the past is that Mr. Kettles does now wish to stay on prolonged life support if there is no hope for recovery.  Minerva Areola says he will continue to discuss the matter further with his family and notify us of any changes if they decide.    Eric's phone number: [phone]   Signed: Darden Palmer, MD PGY-I, Internal Medicine Resident Pager: [phone] (7PM-7AM) 04/13/2012,9:54 PM

## 2012-04-14 ENCOUNTER — Inpatient Hospital Stay (HOSPITAL_COMMUNITY): Payer: Medicaid Other

## 2012-04-14 LAB — GLUCOSE, CAPILLARY
Glucose-Capillary: 150 mg/dL — ABNORMAL HIGH (ref 70–99)
Glucose-Capillary: 163 mg/dL — ABNORMAL HIGH (ref 70–99)
Glucose-Capillary: 170 mg/dL — ABNORMAL HIGH (ref 70–99)
Glucose-Capillary: 201 mg/dL — ABNORMAL HIGH (ref 70–99)
Glucose-Capillary: 240 mg/dL — ABNORMAL HIGH (ref 70–99)

## 2012-04-14 LAB — URINE CULTURE
Colony Count: NO GROWTH
Colony Count: NO GROWTH
Culture: NO GROWTH
Culture: NO GROWTH
Special Requests: NORMAL

## 2012-04-14 LAB — POCT I-STAT 3, ART BLOOD GAS (G3+)
Acid-Base Excess: 5 mmol/L — ABNORMAL HIGH (ref 0.0–2.0)
Acid-Base Excess: 3 mmol/L — ABNORMAL HIGH (ref 0.0–2.0)
Bicarbonate: 36 mEq/L — ABNORMAL HIGH (ref 20.0–24.0)
Bicarbonate: 34.6 mEq/L — ABNORMAL HIGH (ref 20.0–24.0)
O2 Saturation: 87 %
O2 Saturation: 89 %
Patient temperature: 97.9
Patient temperature: 98.4
TCO2: 36 mmol/L (ref 0–100)
TCO2: 38 mmol/L (ref 0–100)
pCO2 arterial: 78.1 mmHg (ref 35.0–45.0)
pCO2 arterial: 78.9 mmHg (ref 35.0–45.0)
pH, Arterial: 7.299 — ABNORMAL LOW (ref 7.350–7.450)
pH, Arterial: 7.25 — ABNORMAL LOW (ref 7.350–7.450)
pO2, Arterial: 69 mmHg — ABNORMAL LOW (ref 80.0–100.0)

## 2012-04-14 LAB — BASIC METABOLIC PANEL
CO2: 32 mEq/L (ref 19–32)
Chloride: 100 mEq/L (ref 96–112)
GFR calc Af Amer: 76 mL/min — ABNORMAL LOW (ref >90)
Potassium: 4.4 mEq/L (ref 3.5–5.1)
Sodium: 139 mEq/L (ref 135–145)

## 2012-04-14 LAB — CBC
MCV: 97.7 fL (ref 78.0–100.0)
Platelets: 139 10*3/uL — ABNORMAL LOW (ref 150–400)
RBC: 5.76 MIL/uL (ref 4.22–5.81)
WBC: 11.2 10*3/uL — ABNORMAL HIGH (ref 4.0–10.5)

## 2012-04-14 LAB — RESPIRATORY VIRUS PANEL
Adenovirus: NOT DETECTED
Parainfluenza 1: NOT DETECTED
Parainfluenza 2: NOT DETECTED
Parainfluenza 3: NOT DETECTED
Rhinovirus: NOT DETECTED

## 2012-04-14 MED ORDER — METHYLPREDNISOLONE SODIUM SUCC 40 MG IJ SOLR
40.0000 mg | Freq: Every day | INTRAMUSCULAR | Status: DC
  Administered 2012-04-14: 40 mg via INTRAVENOUS
  Filled 2012-04-14 (×2): qty 1

## 2012-04-14 MED ORDER — SODIUM CHLORIDE 0.9 % IV SOLN
25.0000 ug/h | INTRAVENOUS | Status: DC
  Administered 2012-04-14: 100 ug/h via INTRAVENOUS
  Filled 2012-04-14: qty 50

## 2012-04-14 MED ORDER — SODIUM CHLORIDE 0.9 % IV SOLN
INTRAVENOUS | Status: DC
  Administered 2012-04-14: 500 mL via INTRAVENOUS
  Administered 2012-04-14: 250 mL via INTRAVENOUS
  Administered 2012-04-14: 20 mL/h via INTRAVENOUS

## 2012-04-14 MED ORDER — OXEPA PO LIQD: 1000.0000 mL | ORAL | Status: DC

## 2012-04-14 MED ORDER — VASOPRESSIN 20 UNIT/ML IJ SOLN
0.0300 [IU]/min | INTRAVENOUS | Status: DC
  Administered 2012-04-14: 0.03 [IU]/min via INTRAVENOUS
  Filled 2012-04-14: qty 2.5

## 2012-04-14 MED ORDER — VANCOMYCIN HCL IN DEXTROSE 1-5 GM/200ML-% IV SOLN
1000.0000 mg | Freq: Two times a day (BID) | INTRAVENOUS | Status: DC
  Administered 2012-04-14 – 2012-04-15 (×2): 1000 mg via INTRAVENOUS
  Filled 2012-04-14 (×5): qty 200

## 2012-04-14 MED ORDER — METHYLPREDNISOLONE SODIUM SUCC 125 MG IJ SOLR
40.0000 mg | Freq: Every day | INTRAMUSCULAR | Status: DC
  Filled 2012-04-14: qty 0.64

## 2012-04-14 MED ORDER — PANTOPRAZOLE SODIUM 40 MG PO PACK
40.0000 mg | PACK | Freq: Every day | ORAL | Status: DC
  Administered 2012-04-14 – 2012-04-22 (×9): 40 mg
  Filled 2012-04-14 (×9): qty 20

## 2012-04-14 MED ORDER — INSULIN ASPART 100 UNIT/ML ~~LOC~~ SOLN
2.0000 [IU] | SUBCUTANEOUS | Status: DC
  Administered 2012-04-15: 4 [IU] via SUBCUTANEOUS
  Administered 2012-04-15: 6 [IU] via SUBCUTANEOUS
  Administered 2012-04-15 (×2): 4 [IU] via SUBCUTANEOUS
  Administered 2012-04-15: 6 [IU] via SUBCUTANEOUS
  Administered 2012-04-16: 2 [IU] via SUBCUTANEOUS
  Administered 2012-04-16 (×2): 4 [IU] via SUBCUTANEOUS
  Administered 2012-04-17: 2 [IU] via SUBCUTANEOUS
  Administered 2012-04-17 (×2): 4 [IU] via SUBCUTANEOUS
  Administered 2012-04-18: 6 [IU] via SUBCUTANEOUS
  Administered 2012-04-18: 4 [IU] via SUBCUTANEOUS
  Administered 2012-04-18 – 2012-04-19 (×6): 2 [IU] via SUBCUTANEOUS
  Administered 2012-04-19 – 2012-04-20 (×4): 4 [IU] via SUBCUTANEOUS
  Administered 2012-04-20: 6 [IU] via SUBCUTANEOUS
  Administered 2012-04-20: 4 [IU] via SUBCUTANEOUS
  Administered 2012-04-20: 2 [IU] via SUBCUTANEOUS
  Administered 2012-04-20: 4 [IU] via SUBCUTANEOUS
  Administered 2012-04-21: 6 [IU] via SUBCUTANEOUS
  Administered 2012-04-21 (×2): 2 [IU] via SUBCUTANEOUS
  Administered 2012-04-21 (×2): 4 [IU] via SUBCUTANEOUS
  Administered 2012-04-21: 6 [IU] via SUBCUTANEOUS
  Administered 2012-04-22 (×2): 4 [IU] via SUBCUTANEOUS
  Administered 2012-04-22: 6 [IU] via SUBCUTANEOUS
  Administered 2012-04-22 – 2012-04-23 (×2): 2 [IU] via SUBCUTANEOUS
  Administered 2012-04-23: 4 [IU] via SUBCUTANEOUS
  Administered 2012-04-23 (×2): 2 [IU] via SUBCUTANEOUS

## 2012-04-14 NOTE — Progress Notes (Signed)
Inpatient Diabetes Program Recommendations  AACE/ADA: New Consensus Statement on Inpatient Glycemic Control (2013)  Target Ranges:  Prepandial:   less than 140 mg/dL      Peak postprandial:   less than 180 mg/dL (1-2 hours)      Critically ill patients:  140 - 180 mg/dL   Reason for Visit: Results for Cody [name], Cody [name] (MRN [id number]) as of 04/14/2012 10:14  Ref. Range 04/13/2012 20:18 04/14/2012 00:15 04/14/2012 03:50 04/14/2012 08:29  Glucose-Capillary Latest Range: 70-99 mg/dL 213 (H) 086 (H) 578 (H) 150 (H)   Note CBG's improved today.  A1C was slightly elevated 6.7%, however note increased HgB (polycythemia) which may make this result inaccurate.   Will follow.

## 2012-04-14 NOTE — Progress Notes (Signed)
CRITICAL VALUE ALERT  Critical value received:  co2 on abg 73  Date of notification:  04/14/12  Time of notification:  0550  Critical value read back:yes  Nurse who received alert:  Crist Fat RN  MD notified (1st page):  Dr. Virgina Organ on unit notified, RT also notified  Time of first page:  NA  MD notified (2nd page):NA  Time of second page:NA  Responding MD:  Virgina Organ  Time MD responded:  0600

## 2012-04-14 NOTE — Progress Notes (Signed)
Name: EAVEN SCHWAGER MRN: [id number] DOB: Oct 31, 1950 LOS: 2  PCCM RESIDENT DAILY PROGRESS NOTE  History of Present Illness:  Mr. Kresse is a 62 year old white male with PMH of COPD, 3 pack per day smoking history 20+ years, and HTN presenting acute rsp failure, COPD exac, atypical PNA, aspiration durign ett.  Lines / Drains: ETT>>1/5 L IJ>>1/5 R A-line>>1/5  Cultures: Blood culture 1/5>> Resp culture 1/6>> rare GNR Resp viral panel 1/6>> Sputum Leigonella Culture 1/6>> Sputum Influenza 1/6>> Sputum 1/6>> Urine 1/5>> Urine 1/6>> Flu 1/5>> Bronch flu>>>  Antibiotics: Zosyn 1/5>> Azithro 1/5>> Vanc 1/6>>> Tamiflu 1/6>>>  Tests / Events: 1/5 Intubated in ED 1/5 Shock , levophed 1/6 ECHO 1/6 Bronched- diffuse pus all lobes 1/7 increasing pCO2, increased RR on vent  Vital Signs: Temp:  [97.8 F (36.6 C)-99.6 F (37.6 C)] 98.4 F (36.9 C) (01/07 0600) Pulse Rate:  [75-92] 78  (01/07 0600) Resp:  [16-33] 28  (01/07 0600) BP: (86-131)/(54-81) 86/55 mmHg (01/07 0600) SpO2:  [90 %-100 %] 92 % (01/07 0600) FiO2 (%):  [50 %-60 %] 50 % (01/07 0600) Weight:  [233 lb 0.4 oz (105.7 kg)] 233 lb 0.4 oz (105.7 kg) (01/07 0100) I/O last 3 completed shifts: In: 1846.5 [I.V.:1746.5; IV Piggyback:100] Out: 2020 [Urine:2020]  Physical Examination: General:  Intubated and sedated Neuro:  Agitated with arousal  HEENT:  ETT in place  Cardiovascular:  Sinus rhythm Lungs:  Course breath sounds bilaterally, improved Abdomen:  Soft, distended, +BS Musculoskeletal:  No edema   Ventilator settings: Vent Mode:  [-] PRVC FiO2 (%):  [50 %-60 %] 50 % Set Rate:  [18 bmp-32 bmp] 32 bmp Vt Set:  [500 mL] 500 mL PEEP:  [5 cmH20-10 cmH20] 10 cmH20 Plateau Pressure:  [16 cmH20-21 cmH20] 20 cmH20 @arterialblood @ Labs and Imaging:   Basic Metabolic Panel:  Lab 04/14/12 7829 04/13/12 0407  NA 139 137135  K 4.4 4.84.6  CL 100 9694*  CO2 32 3131  GLUCOSE 142* 174*174*  BUN 46*  50*49*  CREATININE 1.15 1.57*1.53*  CALCIUM 7.8* 7.6*7.5*  MG -- 2.1  PHOS -- 3.9   Liver Function Tests:  Lab 04/13/12 0407  AST 19  ALT 16  ALKPHOS 60  BILITOT 0.4  PROT 5.7*  ALBUMIN 2.7*2.7*   CBC:  Lab 04/14/12 0400 04/13/12 0407 04/12/12 1833  WBC 11.2* 9.2 --  NEUTROABS -- 7.8* 7.6  HGB 17.4* 18.2* --  HCT 56.3* 57.5* --  MCV 97.7 96.6 --  PLT 139* 135* --   Cardiac Enzymes:  Lab 04/13/12 0407 04/13/12 0155 04/12/12 1932  CKTOTAL -- -- --  CKMB -- -- --  CKMBINDEX -- -- --  TROPONINI <0.30 <0.30 <0.30   BNP:  Lab 04/12/12 1832  PROBNP 17911.0*   CBG:  Lab 04/14/12 0350 04/14/12 0015 04/13/12 2018 04/13/12 1623 04/13/12 1207 04/13/12 0759  GLUCAP 149* 170* 179* 173* 183* 178*   Hemoglobin A1C:  Lab 04/13/12 0407  HGBA1C 6.7*    Urinalysis:  Lab 04/13/12 0033 04/12/12 1957  COLORURINE YELLOW AMBER*  LABSPEC 1.016 1.023  PHURINE 5.0 5.0  GLUCOSEU NEGATIVE NEGATIVE  HGBUR SMALL* NEGATIVE  BILIRUBINUR NEGATIVE SMALL*  KETONESUR NEGATIVE NEGATIVE  PROTEINUR NEGATIVE 100*  UROBILINOGEN 0.2 1.0  NITRITE NEGATIVE NEGATIVE  LEUKOCYTESUR NEGATIVE NEGATIVE      Lab 04/13/12 0155  LATICACIDVEN 1.6  PROCALCITON 0.29    Assessment and Plan: PULMONARY  ASSESSMENT: Long time smoker with COPD, worsening of sx over the last few days. Intubated  in ED with aspiration. Likely COPD with CHF exac 1) Acute hypercapnic respiratory failure secondary to COPD exacerbation. Carboxyhemoglobin 2.4  2) Aspiration at time of intubation, on abx  3) Pneumonitis vs. CAP--b/l interstitial opacities on cxr   4) Hx of COPD--with chronic tobacco abuse hx 3 pack per day 20+ years. On symbicort at home, r/o fibrosis 5) ARDS: on protocol. Elevated pCO2, resp rate increased 6) some bloody secretions from aspiration. Bronched, blood likely from intubation, purulence throughout airway. R/o pulm / renal syndrome PLAN:  -Continue ards protocol, Avoid rr greater 24 if  able: re reduce to 24, repeat abg in 1 hr -goal to 6 cc/kg, eval plat pressures, was 31 on 7 cc/kg -Cont duonebz q4 scheduled and q2 prn  -Cont IV solumedrol, but reduce with such infection on bronch -pcxr in am -see ID -abg reviewed unable to reduce peep -permissive -when off pressors, lasix  CARDIOVASCULAR  ASSESSMENT:  1) Hypotension--septic shock likely 2) Elevated proBNP- ProBNP on admission 17911. Trop x3 negative. ECHO  With normal systolic and diastolic dysfunction, EF 50-55% 3) Hx of HTN-- home regimen Lisinopril 40mg , Norvasc 5mg , and HCTZ 25mg  qd  PLAN:  -IVF--monitor CVP 10 = ards, CVP 14 -hold home HTN medication  -Cont Levo to map goal >60, at 3 mics now, unable to reduce, add vaso -Cont steroids  RENAL  ASSESSMENT:  1) Acute Kidney injury--likely pre-renal in setting of volume depletion. Cr on admission 1.84, improving. On NS@75   No evidence for pulm renal syndrome PLAN:  -IVF to kvo, cvp 14 -am BMP  -cvp 14, no lasix untll off pressors  GASTROINTESTINAL  ASSESSMENT: Hx of GERD.  PLAN:  -TF tolerated -GI PPx: Protonix   HEMATOLOGIC  ASSESSMENT:  1) Polycythemia--Hb 20.1 on admission, possibly secondary to combined polycythemia in setting of chronic tobacco abuse and secondary polycythemia in response to hypoxia. Hgb improving. Lovenox held 2/2 bloody secretions and pt on Heparin. Leukocytosis today to 11.2.  PLAN:  - am cbc  - continue to monitor  - Cont hydration Sub q hep tolerated  INFECTIOUS  ASSESSMENT:  1) Presumed CAP--diffuse interstitial opacities on cxr and worsening shortness of breath  2) Aspirated at time of intubation, pneumonitis? Bronch with purulence throughout airway High clinical suspcion H1N1 regardelss of nasal neg PLAN:  -continue zosyn, azithro, vanc, and tamiflu ( until bronch flu neg) Await viral pattern  ENDOCRINE  ASSESSMENT: On steroids this admission. CBGs elevated.  PLAN:  -Continue SSI -CBG monitoring BID    -Continue IV solumedrol , reduce -Cont ICU Hyperglycemic protocol   NEUROLOGIC  ASSESSMENT: Acute encephalopathy--likely secondary to hypoxia / hypercarbia and possible infectious process (CAP?).  PLAN:  -sedated on mechanical ventilation  -daily wua  CLINICAL SUMMARY: Mr. Balkcom is a 62 year old male with hx of COPD presenting PNA< ARDS. Permissive hypercapnia, on low dose presors, add vaso, maintain current regimen.  Best practices / Disposition: -->ICU status under PCCM -->full code -->Heparin for DVT Px -->Protonix for GI Px -->ventilator bundle -->TF 1/6   Genelle Gather 04/14/2012, 6:58 AM  The patient is critically ill with multiple organ systems failure and requires high complexity decision making for assessment and support, frequent evaluation and titration of therapies, application of advanced monitoring technologies and extensive interpretation of multiple databases. Critical Care Time devoted to patient care services described in this note is 30 minutes.  [name]. [name], MD, FACP Pgr: [phone] [facility]

## 2012-04-14 NOTE — Progress Notes (Signed)
CRITICAL VALUE ALERT  Critical value received:  co2 on abg 79  Date of notification:  04/14/12  Time of notification:  0415  Critical value read back:on istat  Nurse who received alert:  Crist Fat RN  MD notified (1st page):  Dr. Virgina Organ notified in person  Time of first page:  0415  MD notified (2nd page):NA  Time of second page: NA  Responding MD:  Virgina Organ  Time MD responded:  [phone]

## 2012-04-14 NOTE — Progress Notes (Signed)
UR complete 

## 2012-04-14 NOTE — Progress Notes (Signed)
Pt. Was recruited per ARDS protocol.

## 2012-04-14 NOTE — Progress Notes (Signed)
Brief Nutrition Note - Consult   Received consult for TF initiation and management.  Initial nutrition assessment was completed 1/6, please see progress notes for details.  Patient is tolerating Oxepa TF at 45 ml/h, reached goal at 8 AM today. Also receiving prostat 60 ml TID for a total intake of 2220 kcals, 158 gm protein, 848 ml free water daily.  RD to follow for TF tolerance/adequacy and make adjustments to TF regimen as needed.  Joaquin Courts, RD, LDN, CNSC Pager# [phone] After Hours Pager# [phone]

## 2012-04-14 NOTE — Progress Notes (Signed)
ANTIBIOTIC CONSULT NOTE - Follow-Up  Pharmacy Consult for Vancomycin, Azithromycin, Vancomycin Indication: pneumonia  No Known Allergies  Patient Measurements: Height: 6\' 2"  (188 cm) Weight: 233 lb 0.4 oz (105.7 kg) IBW/kg (Calculated) : 82.2   Vital Signs: Temp: 98.7 F (37.1 C) (01/07 0900) Temp src: Core (Comment) (01/07 0800) BP: 101/64 mmHg (01/07 0800) Pulse Rate: 75  (01/07 0900) Intake/Output from previous day: 01/06 0701 - 01/07 0700 In: 2750.4 [I.V.:1990.4; NG/GT:420; IV Piggyback:300] Out: 1405 [Urine:1405] Intake/Output from this shift: Total I/O In: 287.6 [I.V.:182.6; NG/GT:80; IV Piggyback:25] Out: 175 [Urine:175]  Labs:  [facility] 04/14/12 0400 04/13/12 0407 04/12/12 1833  WBC 11.2* 9.2 9.9  HGB 17.4* 18.2* 20.1*  PLT 139* 135* 147*  LABCREA -- -- --  CREATININE 1.15 1.57*1.53* 1.84*   Estimated Creatinine Clearance: 85.2 ml/min (by C-G formula based on Cr of 1.15). No results found for this basename: VANCOTROUGH:2,VANCOPEAK:2,VANCORANDOM:2,GENTTROUGH:2,GENTPEAK:2,GENTRANDOM:2,TOBRATROUGH:2,TOBRAPEAK:2,TOBRARND:2,AMIKACINPEAK:2,AMIKACINTROU:2,AMIKACIN:2, in the last 72 hours   Microbiology: Recent Results (from the past 720 hour(s))  URINE CULTURE     Status: Normal   Collection Time   04/12/12  7:57 PM      Component Value Range Status Comment   Specimen Description URINE, CATHETERIZED   Final    Special Requests NONE   Final    Culture  Setup Time 04/13/2012 02:43   Final    Colony Count NO GROWTH   Final    Culture NO GROWTH   Final    Report Status 04/14/2012 FINAL   Final   CULTURE, BLOOD (ROUTINE X 2)     Status: Normal (Preliminary result)   Collection Time   04/12/12 10:15 PM      Component Value Range Status Comment   Specimen Description BLOOD RIGHT HAND   Final    Special Requests BOTTLES DRAWN AEROBIC ONLY 3CC   Final    Culture  Setup Time 04/13/2012 05:42   Final    Culture     Final    Value:        BLOOD CULTURE RECEIVED NO GROWTH  TO DATE CULTURE WILL BE HELD FOR 5 DAYS BEFORE ISSUING A FINAL NEGATIVE REPORT   Report Status PENDING   Incomplete   CULTURE, BLOOD (ROUTINE X 2)     Status: Normal (Preliminary result)   Collection Time   04/12/12 10:26 PM      Component Value Range Status Comment   Specimen Description BLOOD LEFT HAND   Final    Special Requests BOTTLES DRAWN AEROBIC ONLY 3CC   Final    Culture  Setup Time 04/13/2012 05:42   Final    Culture     Final    Value:        BLOOD CULTURE RECEIVED NO GROWTH TO DATE CULTURE WILL BE HELD FOR 5 DAYS BEFORE ISSUING A FINAL NEGATIVE REPORT   Report Status PENDING   Incomplete   MRSA PCR SCREENING     Status: Normal   Collection Time   04/13/12 12:29 AM      Component Value Range Status Comment   MRSA by PCR NEGATIVE  NEGATIVE Final   URINE CULTURE     Status: Normal   Collection Time   04/13/12 12:33 AM      Component Value Range Status Comment   Specimen Description URINE, CATHETERIZED   Final    Special Requests Normal   Final    Culture  Setup Time 04/13/2012 01:56   Final    Colony Count NO GROWTH  Final    Culture NO GROWTH   Final    Report Status 04/14/2012 FINAL   Final   CULTURE, RESPIRATORY     Status: Normal (Preliminary result)   Collection Time   04/13/12  4:46 AM      Component Value Range Status Comment   Specimen Description TRACHEAL ASPIRATE   Final    Special Requests Normal   Final    Gram Stain     Final    Value: FEW WBC PRESENT, PREDOMINANTLY PMN     NO SQUAMOUS EPITHELIAL CELLS SEEN     RARE GRAM NEGATIVE RODS   Culture Culture reincubated for better growth   Final    Report Status PENDING   Incomplete   LEGIONELLA CULTURE     Status: Normal (Preliminary result)   Collection Time   04/13/12  5:01 PM      Component Value Range Status Comment   Specimen Description BRONCHIAL WASHINGS   Final    Special Requests NONE   Final    Culture     Final    Value: NO LEGIONELLA ISOLATED, CULTURE IN PROGRESS FOR 5 DAYS   Report Status PENDING    Incomplete   CULTURE, RESPIRATORY     Status: Normal (Preliminary result)   Collection Time   04/13/12  5:01 PM      Component Value Range Status Comment   Specimen Description BRONCHIAL WASHINGS   Final    Special Requests NONE   Final    Gram Stain     Final    Value: ABUNDANT WBC PRESENT,BOTH PMN AND MONONUCLEAR     NO SQUAMOUS EPITHELIAL CELLS SEEN     NO ORGANISMS SEEN   Culture PENDING   Incomplete    Report Status PENDING   Incomplete     Medical History: Past Medical History  Diagnosis Date  . Stroke   . Hypertension   . Hyperlipidemia   . GERD (gastroesophageal reflux disease)   . COPD (chronic obstructive pulmonary disease)    Medications:  Anti-infectives     Start     Dose/Rate Route Frequency Ordered Stop   04/14/12 1100   vancomycin (VANCOCIN) IVPB 1000 mg/200 mL premix        1,000 mg 200 mL/hr over 60 Minutes Intravenous Every 12 hours 04/14/12 0821     04/13/12 2300   vancomycin (VANCOCIN) 750 mg in sodium chloride 0.9 % 150 mL IVPB  Status:  Discontinued        750 mg 150 mL/hr over 60 Minutes Intravenous Every 12 hours 04/13/12 1347 04/14/12 0821   04/13/12 1400   oseltamivir (TAMIFLU) 6 MG/ML suspension 150 mg        150 mg Oral 2 times daily 04/13/12 1327 04/18/12 0959   04/13/12 1400   vancomycin (VANCOCIN) 1,500 mg in sodium chloride 0.9 % 500 mL IVPB        1,500 mg 250 mL/hr over 120 Minutes Intravenous  Once 04/13/12 1347 04/13/12 1706   04/13/12 0600   piperacillin-tazobactam (ZOSYN) IVPB 3.375 g        3.375 g 12.5 mL/hr over 240 Minutes Intravenous 3 times per day 04/13/12 0102     04/12/12 2200   piperacillin-tazobactam (ZOSYN) IVPB 3.375 g        3.375 g 100 mL/hr over 30 Minutes Intravenous  Once 04/12/12 2148 04/12/12 2347   04/12/12 2145   azithromycin (ZITHROMAX) 500 mg in dextrose 5 % 250 mL IVPB  500 mg 250 mL/hr over 60 Minutes Intravenous Every 24 hours 04/12/12 2143           Assessment: 61 YOM on Vancomycin Day  #2, Zosyn and Azithromycin day #3 for r/o HAP, asp pna. Also on Tamiflu (Day #2) empirically while influenza is pending. Sputum and blood cultures pending.  Strep pneumo UA (-). Legionella pending. Creat better and UOP up to 0.6 cc/kg/hr.   WBC 11.2 - increasing. Tm 99.3.   Goal of Therapy:  Vancomycin trough level 15-20 mcg/ml  Plan:  1. Change Vancomycin to 1gm IV q12h. Continue Zosyn 3.375gm IV q8h and Azithromycin 500mg  IV q24h. 2. Follow-up renal function, trough at [facility]. 3. Follow-up cultures and viral panel.   Christoper Fabian, PharmD, BCPS Clinical pharmacist, pager [phone] 04/14/2012,10:22 AM

## 2012-04-15 ENCOUNTER — Inpatient Hospital Stay (HOSPITAL_COMMUNITY): Payer: Medicaid Other

## 2012-04-15 LAB — LEGIONELLA ANTIGEN, URINE: Legionella Antigen, Urine: NEGATIVE

## 2012-04-15 LAB — CBC
MCH: 30 pg (ref 26.0–34.0)
MCV: 98.5 fL (ref 78.0–100.0)
Platelets: 132 10*3/uL — ABNORMAL LOW (ref 150–400)
RDW: 18.4 % — ABNORMAL HIGH (ref 11.5–15.5)
WBC: 7.9 10*3/uL (ref 4.0–10.5)

## 2012-04-15 LAB — POCT I-STAT 3, ART BLOOD GAS (G3+)
Bicarbonate: 38.7 mEq/L — ABNORMAL HIGH (ref 20.0–24.0)
TCO2: 41 mmol/L (ref 0–100)
pCO2 arterial: 77.1 mmHg (ref 35.0–45.0)
pH, Arterial: 7.307 — ABNORMAL LOW (ref 7.350–7.450)

## 2012-04-15 LAB — BASIC METABOLIC PANEL
Calcium: 8.2 mg/dL — ABNORMAL LOW (ref 8.4–10.5)
Creatinine, Ser: 0.89 mg/dL (ref 0.50–1.35)
GFR calc Af Amer: >90 mL/min (ref >90)
GFR calc non Af Amer: 89 mL/min — ABNORMAL LOW (ref >90)

## 2012-04-15 LAB — GLUCOSE, CAPILLARY
Glucose-Capillary: 173 mg/dL — ABNORMAL HIGH (ref 70–99)
Glucose-Capillary: 103 mg/dL — ABNORMAL HIGH (ref 70–99)
Glucose-Capillary: 192 mg/dL — ABNORMAL HIGH (ref 70–99)

## 2012-04-15 LAB — CULTURE, RESPIRATORY W GRAM STAIN: Special Requests: NORMAL

## 2012-04-15 MED ORDER — FUROSEMIDE 10 MG/ML IJ SOLN
20.0000 mg | Freq: Two times a day (BID) | INTRAMUSCULAR | Status: AC
  Administered 2012-04-15 – 2012-04-16 (×2): 20 mg via INTRAVENOUS
  Filled 2012-04-15 (×2): qty 2

## 2012-04-15 MED ORDER — FUROSEMIDE 10 MG/ML IJ SOLN: 20.0000 mg | Freq: Two times a day (BID) | INTRAMUSCULAR | Status: DC

## 2012-04-15 MED ORDER — FENTANYL CITRATE 0.05 MG/ML IJ SOLN
25.0000 ug | INTRAMUSCULAR | Status: DC | PRN
  Administered 2012-04-15 (×3): 50 ug via INTRAVENOUS
  Filled 2012-04-15 (×5): qty 2

## 2012-04-15 MED ORDER — DEXMEDETOMIDINE HCL IN NACL 200 MCG/50ML IV SOLN
0.2000 ug/kg/h | INTRAVENOUS | Status: DC
  Administered 2012-04-15: 0.4 ug/kg/h via INTRAVENOUS
  Administered 2012-04-15 (×2): 0.5 ug/kg/h via INTRAVENOUS
  Administered 2012-04-15: 0.7 ug/kg/h via INTRAVENOUS
  Administered 2012-04-16: 2.1 ug/kg/h via INTRAVENOUS
  Filled 2012-04-15 (×6): qty 50

## 2012-04-15 MED ORDER — PREDNISOLONE 5 MG PO TABS
40.0000 mg | ORAL_TABLET | Freq: Every day | ORAL | Status: DC
  Administered 2012-04-15 – 2012-04-20 (×6): 40 mg via ORAL
  Filled 2012-04-15 (×7): qty 8

## 2012-04-15 NOTE — Progress Notes (Signed)
Pt has continued low urine output with sediment. Bladder scanner showed 440 cc of urine in bladder. Notified Dr. Virgina Organ. Replaced foley catheter per MD which drained 475 cc. Will continue to monitor.

## 2012-04-15 NOTE — Progress Notes (Signed)
Name: Cody [name] MRN: [id number] DOB: [birth year] LOS: 3  PCCM RESIDENT DAILY PROGRESS NOTE  History of Present Illness:  Cody [name] is a 62 year old white male with PMH of COPD, 3 pack per day smoking history 20+ years, and HTN presenting acute rsp failure, COPD exac, atypical PNA, aspiration durign ett.  Lines / Drains: ETT>>1/5 L IJ>>1/5 R A-line>>1/5  Cultures: Blood culture 1/5>> Resp culture 1/6>> rare GNR Resp viral panel 1/6>> Influenza A and Metapneumovirus Sputum Leigonella Culture 1/6>> Sputum Influenza 1/6>> Sputum 1/6>> Urine 1/5>> Neg Urine 1/6>> Neg Flu 1/5>> Bronch flu>>>  Antibiotics: Zosyn 1/5>>plan stop 12th Azithro 1/5>>1/8 Vanc 1/6>>>1/8 Tamiflu 1/6>>>  Tests / Events: 1/5 Intubated in ED 1/5 Shock , levophed 1/6 ECHO - 55%, mod dil rv, PA presures>>> 1/6 Bronched- diffuse pus all lobes 1/7 increasing pCO2, increased RR on vent   Vital Signs: Temp:  [97.4 F (36.3 C)-99.3 F (37.4 C)] 98.5 F (36.9 C) (01/08 0400) Pulse Rate:  [61-91] 61  (01/08 0500) Resp:  [20-32] 24  (01/08 0500) BP: (89-135)/(52-81) 99/64 mmHg (01/08 0500) SpO2:  [87 %-97 %] 96 % (01/08 0500) FiO2 (%):  [50 %-60 %] 60 % (01/08 0500) Weight:  [235 lb 14.3 oz (107 kg)] 235 lb 14.3 oz (107 kg) (01/08 0400) I/O last 3 completed shifts: In: 4249.9 [I.V.:2684.9; Other:40; NG/GT:950; IV Piggyback:575] Out: 2140 [Urine:2140]  Physical Examination: General:  Intubated and sedated Neuro:  Agitated with arousal  HEENT:  ETT in place  Cardiovascular:  Sinus rhythm Lungs:  Course breath sounds bilaterally, improved Abdomen:  Soft, distended, +BS, rectal tube in place Musculoskeletal:  No edema   Ventilator settings: Vent Mode:  [-] PRVC FiO2 (%):  [50 %-60 %] 60 % Set Rate:  [24 bmp-32 bmp] 24 bmp Vt Set:  [500 mL] 500 mL PEEP:  [10 cmH20] 10 cmH20 Plateau Pressure:  [24 cmH20-31 cmH20] 24 cmH20 @arterialblood @ Labs and Imaging:   Basic Metabolic Panel:  Lab  04/14/12 0400 04/13/12 0407  NA 139 137135  K 4.4 4.84.6  CL 100 9694*  CO2 32 3131  GLUCOSE 142* 174*174*  BUN 46* 50*49*  CREATININE 1.15 1.57*1.53*  CALCIUM 7.8* 7.6*7.5*  MG -- 2.1  PHOS -- 3.9   Liver Function Tests:  Lab 04/13/12 0407  AST 19  ALT 16  ALKPHOS 60  BILITOT 0.4  PROT 5.7*  ALBUMIN 2.7*2.7*   CBC:  Lab 04/15/12 0550 04/14/12 0400 04/13/12 0407 04/12/12 1833  WBC 7.9 11.2* -- --  NEUTROABS -- -- 7.8* 7.6  HGB 16.4 17.4* -- --  HCT 53.8* 56.3* -- --  MCV 98.5 97.7 -- --  PLT 132* 139* -- --   Cardiac Enzymes:  Lab 04/13/12 0407 04/13/12 0155 04/12/12 1932  CKTOTAL -- -- --  CKMB -- -- --  CKMBINDEX -- -- --  TROPONINI <0.30 <0.30 <0.30   BNP:  Lab 04/12/12 1832  PROBNP 17911.0*   CBG:  Lab 04/15/12 0401 04/14/12 2353 04/14/12 2048 04/14/12 1623 04/14/12 1222 04/14/12 0829  GLUCAP 191* 240* 201* 205* 163* 150*   Hemoglobin A1C:  Lab 04/13/12 0407  HGBA1C 6.7*    Urinalysis:  Lab 04/13/12 0033 04/12/12 1957  COLORURINE YELLOW AMBER*  LABSPEC 1.016 1.023  PHURINE 5.0 5.0  GLUCOSEU NEGATIVE NEGATIVE  HGBUR SMALL* NEGATIVE  BILIRUBINUR NEGATIVE SMALL*  KETONESUR NEGATIVE NEGATIVE  PROTEINUR NEGATIVE 100*  UROBILINOGEN 0.2 1.0  NITRITE NEGATIVE NEGATIVE  LEUKOCYTESUR NEGATIVE NEGATIVE      Lab 04/13/12 0155  LATICACIDVEN 1.6  PROCALCITON 0.29    Assessment and Plan: PULMONARY  ASSESSMENT: Long time smoker with COPD, worsening of sx over the last few days. Intubated in ED with aspiration. Likely COPD with CHF exac 1) Acute hypercapnic respiratory failure secondary to flu, COPD exacerbation. Carboxyhemoglobin 2.4  2) Aspiration at time of intubation, on abx  3) Pneumonitis vs. CAP--b/l interstitial opacities on cxr, worsening on the right  4) Hx of COPD--with chronic tobacco abuse hx 3 pack per day 20+ years. On symbicort at home, r/o fibrosis 5) ARDS: on protocol. Elevated pCO2, permissive hypercapnia 6)  Bloody secretions from aspiration. Bronched, blood likely from intubation, purulence throughout airway. Pulm /renal syndrome ruled out. PLAN:  -Continue ards protocol, Avoid rr greater 24 if able -goal to 6 cc/kg, eval plat pressures, abg now, assess if ph less 7.25, if so, add bicarb -Cont duonebz q4 scheduled and q2 prn  -In setting infection reduce solumedrol -pcxr in am, with pos balance slight worse -consider FACTT now, as pos balance worse int changes pcxr  CARDIOVASCULAR  ASSESSMENT:  1) Hypotension--septic shock likely 2) Elevated proBNP- ProBNP on admission 17911. Trop x3 negative. ECHO with normal systolic and diastolic dysfunction, EF 50-55%. On Levo, added Vaso 1/7 3) Hx of HTN-- home regimen Lisinopril 40mg , Norvasc 5mg , and HCTZ 25mg  qd  PLAN:  -IVF--monitor CVP, last 8, now off pressors, start lasix to cvp 4 -hold home HTN medication  -Cont steroids, off pressors, reduce  RENAL  ASSESSMENT:  1) Acute Kidney injury--likely pre-renal in setting of volume depletion. Cr on admission 1.84, improving. On NS@75   No evidence for pulm renal syndrome Increased sediment clogging foley, unable to be flushed, replaced twice since admission PLAN:  -IVF kvo, lasix 20 q12h Chem in am   GASTROINTESTINAL  ASSESSMENT:  Hx of GERD.  TF with increased residuals, still having bowel movements  PLAN:  -TF tolerated -GI PPx: Protonix   HEMATOLOGIC  ASSESSMENT:  1) Polycythemia--Hb 20.1 on admission, possibly secondary to combined polycythemia in setting of chronic tobacco abuse and secondary polycythemia in response to hypoxia. Hgb continues to improve. Lovenox held 2/2 bloody secretions and pt on Heparin. Leukocytosis resolved.  PLAN:  - kvo, likely now some dilution to his hct -Sub q hep tolerated  INFECTIOUS  ASSESSMENT:  1) FLU pneumonia 2) Aspirated at time of intubation, pneumonitis vs PNA? Bronch with purulence throughout airway PLAN:  No  mrsa on bronch, dc vanc Dc  azithro Continue tamiflu, zosyn add stop date  ENDOCRINE  ASSESSMENT: On steroids this admission. CBGs moderately elevated.  PLAN:  -Continue SSI -CBG monitoring BID  -IV solumedrol to pred -Cont ICU Hyperglycemic protocol   NEUROLOGIC  ASSESSMENT: Acute encephalopathy: Likely secondary to hypoxia / hypercarbia and possible infectious process (CAP?)., delirium likely  PLAN:  -sedated on mechanical ventilation, now off pressors, start precedex, dc versed   -daily wua -may need Risperdal Chair position  CLINICAL SUMMARY: Cody [name] is a 62 year old male with hx of COPD presenting PNA< ARDS. Permissive hypercapnia, repeat abg, off pressors, kvo, lasix, precedex attempt  Best practices / Disposition: -->ICU status under PCCM -->full code -->Heparin for DVT Px -->Protonix for GI Px -->ventilator bundle -->TF 1/6   Cody [name] 04/15/2012, 6:41 AM  The patient is critically ill with multiple organ systems failure and requires high complexity decision making for assessment and support, frequent evaluation and titration of therapies, application of advanced monitoring technologies and extensive interpretation of multiple databases. Critical Care Time devoted  to patient care services described in this note is 30 minutes.  I have fully examined this patient and agree with above findings.    And edited in full  Cody [name]. [name], MD, FACP Pgr: [phone] [place] [facility]

## 2012-04-16 ENCOUNTER — Inpatient Hospital Stay (HOSPITAL_COMMUNITY): Payer: Medicaid Other

## 2012-04-16 LAB — POCT I-STAT 3, ART BLOOD GAS (G3+)
Acid-Base Excess: 12 mmol/L — ABNORMAL HIGH (ref 0.0–2.0)
Acid-Base Excess: 13 mmol/L — ABNORMAL HIGH (ref 0.0–2.0)
Bicarbonate: 44.3 mEq/L — ABNORMAL HIGH (ref 20.0–24.0)
O2 Saturation: 89 %
Patient temperature: 99.1
Patient temperature: 99.8
TCO2: 44 mmol/L (ref 0–100)
pCO2 arterial: 68.9 mmHg (ref 35.0–45.0)
pH, Arterial: 7.395 (ref 7.350–7.450)
pH, Arterial: 7.312 — ABNORMAL LOW (ref 7.350–7.450)
pO2, Arterial: 66 mmHg — ABNORMAL LOW (ref 80.0–100.0)

## 2012-04-16 LAB — BLOOD GAS, ARTERIAL
Bicarbonate: 42.3 mEq/L — ABNORMAL HIGH (ref 20.0–24.0)
Patient temperature: 98.6
TCO2: 44.6 mmol/L (ref 0–100)
pH, Arterial: 7.359 (ref 7.350–7.450)
pO2, Arterial: 68.7 mmHg — ABNORMAL LOW (ref 80.0–100.0)

## 2012-04-16 LAB — CULTURE, RESPIRATORY W GRAM STAIN

## 2012-04-16 LAB — GLUCOSE, CAPILLARY
Glucose-Capillary: 105 mg/dL — ABNORMAL HIGH (ref 70–99)
Glucose-Capillary: 106 mg/dL — ABNORMAL HIGH (ref 70–99)
Glucose-Capillary: 119 mg/dL — ABNORMAL HIGH (ref 70–99)
Glucose-Capillary: 161 mg/dL — ABNORMAL HIGH (ref 70–99)
Glucose-Capillary: 171 mg/dL — ABNORMAL HIGH (ref 70–99)

## 2012-04-16 LAB — BASIC METABOLIC PANEL
BUN: 40 mg/dL — ABNORMAL HIGH (ref 6–23)
CO2: 39 mEq/L — ABNORMAL HIGH (ref 19–32)
Chloride: 98 mEq/L (ref 96–112)
Glucose, Bld: 105 mg/dL — ABNORMAL HIGH (ref 70–99)
Potassium: 4.5 mEq/L (ref 3.5–5.1)
Sodium: 143 mEq/L (ref 135–145)

## 2012-04-16 LAB — CBC
HCT: 57.2 % — ABNORMAL HIGH (ref 39.0–52.0)
Hemoglobin: 17.7 g/dL — ABNORMAL HIGH (ref 13.0–17.0)
MCHC: 30.9 g/dL (ref 30.0–36.0)
RBC: 5.84 MIL/uL — ABNORMAL HIGH (ref 4.22–5.81)
WBC: 7.9 10*3/uL (ref 4.0–10.5)

## 2012-04-16 MED ORDER — HALOPERIDOL LACTATE 5 MG/ML IJ SOLN: 5.0000 mg | Freq: Four times a day (QID) | INTRAMUSCULAR | Status: DC | PRN

## 2012-04-16 MED ORDER — DEXMEDETOMIDINE BOLUS VIA INFUSION
1.0000 ug/kg | Freq: Once | INTRAVENOUS | Status: AC
  Administered 2012-04-16: 82.2 ug via INTRAVENOUS
  Filled 2012-04-16: qty 83

## 2012-04-16 MED ORDER — MIDAZOLAM HCL 2 MG/2ML IJ SOLN
INTRAMUSCULAR | Status: AC
  Administered 2012-04-16: 2 mg
  Filled 2012-04-16: qty 2

## 2012-04-16 MED ORDER — SODIUM CHLORIDE 0.9 % IV SOLN
25.0000 ug/h | INTRAVENOUS | Status: DC
  Administered 2012-04-16: 200 ug/h via INTRAVENOUS
  Administered 2012-04-17: 250 ug/h via INTRAVENOUS
  Administered 2012-04-17: 300 ug/h via INTRAVENOUS
  Administered 2012-04-18: 125 ug/h via INTRAVENOUS
  Administered 2012-04-18: 250 ug/h via INTRAVENOUS
  Administered 2012-04-19: 150 ug/h via INTRAVENOUS
  Filled 2012-04-16 (×9): qty 50

## 2012-04-16 MED ORDER — DEXTROSE 5 % IV SOLN
1.0000 g | INTRAVENOUS | Status: DC
  Administered 2012-04-16 – 2012-04-22 (×6): 1 g via INTRAVENOUS
  Filled 2012-04-16 (×7): qty 10

## 2012-04-16 MED ORDER — SODIUM CHLORIDE 0.9 % IV SOLN
1.0000 mg/h | INTRAVENOUS | Status: DC
  Administered 2012-04-16 – 2012-04-17 (×3): 7 mg/h via INTRAVENOUS
  Administered 2012-04-17: 3 mg/h via INTRAVENOUS
  Administered 2012-04-17: 7 mg/h via INTRAVENOUS
  Administered 2012-04-18: 3 mg/h via INTRAVENOUS
  Administered 2012-04-19: 1 mg/h via INTRAVENOUS
  Administered 2012-04-19: 4 mg/h via INTRAVENOUS
  Filled 2012-04-16 (×9): qty 10

## 2012-04-16 MED ORDER — MIDAZOLAM BOLUS VIA INFUSION
1.0000 mg | INTRAVENOUS | Status: DC | PRN
  Filled 2012-04-16: qty 2

## 2012-04-16 MED ORDER — HALOPERIDOL LACTATE 5 MG/ML IJ SOLN
INTRAMUSCULAR | Status: AC
  Filled 2012-04-16: qty 1

## 2012-04-16 MED ORDER — FENTANYL BOLUS VIA INFUSION
25.0000 ug | Freq: Four times a day (QID) | INTRAVENOUS | Status: DC | PRN
  Filled 2012-04-16: qty 100

## 2012-04-16 MED ORDER — HALOPERIDOL LACTATE 5 MG/ML IJ SOLN
5.0000 mg | Freq: Four times a day (QID) | INTRAMUSCULAR | Status: DC
  Administered 2012-04-16: 5 mg via INTRAVENOUS

## 2012-04-16 MED ORDER — FENTANYL CITRATE 0.05 MG/ML IJ SOLN
25.0000 ug | INTRAMUSCULAR | Status: DC | PRN
  Administered 2012-04-16 – 2012-04-21 (×3): 100 ug via INTRAVENOUS
  Filled 2012-04-16 (×4): qty 2

## 2012-04-16 MED ORDER — DEXMEDETOMIDINE HCL IN NACL 400 MCG/100ML IV SOLN
0.2000 ug/kg/h | INTRAVENOUS | Status: DC
  Administered 2012-04-16: 2.1 ug/kg/h via INTRAVENOUS
  Filled 2012-04-16 (×2): qty 100

## 2012-04-16 NOTE — Progress Notes (Signed)
Name: Cody [name] MRN: [id number] DOB: July 19, 1950 LOS: 4  PCCM RESIDENT DAILY PROGRESS NOTE  History of Present Illness:  Cody [name] is a 62 year old white male with PMH of COPD, 3 pack per day smoking history 20+ years, and HTN presenting acute rsp failure, COPD exac, atypical PNA and aspiration durign ett.  Lines / Drains: ETT 1/5 >> L IJ 1/5 >> R A-line 1/5 >>1/8 pulled out by pt  Cultures: Blood culture 1/5>> Resp culture 1/6>>  Resp viral panel 1/6>> Influenza A and Metapneumovirus Sputum Leigonella Culture 1/6>> Bronch Sputum Influenza 1/6>> Bronch Sputum 1/6>>  H. influenzae Urine 1/5>> Neg Urine 1/6>> Neg Flu 1/5>> Neg   Antibiotics: Zosyn 1/5>> 1/9 Azithro 1/5>>1/8 Vanc 1/6>>>1/8 Tamiflu 1/6>>>  Tests / Events: 1/5 Intubated in ED 1/5 Shock , levophed 1/6 ECHO - 55%, mod dil rv, PA pressures>>> not measured b/c no TR jet 1/6 Bronched- diffuse pus all lobes 1/7 increasing pCO2, increased RR on vent 1/8 Changed sedation to Precedex, more agitated than with versed 1/9 Changed sedation back to Versed  Vital Signs: Temp:  [97 F (36.1 C)-99.1 F (37.3 C)] 99.1 F (37.3 C) (01/09 0425) Pulse Rate:  [38-122] 91  (01/09 0445) Resp:  [15-31] 20  (01/09 0445) BP: (96-173)/(62-107) 148/96 mmHg (01/09 0400) SpO2:  [85 %-98 %] 93 % (01/09 0445) FiO2 (%):  [50 %] 50 % (01/09 0309) Weight:  [234 lb 5.6 oz (106.3 kg)] 234 lb 5.6 oz (106.3 kg) (01/09 0500) I/O last 3 completed shifts: In: 4454.8 [I.V.:1494.8; NG/GT:1835; IV Piggyback:1125] Out: 2390 [Urine:2390]  Physical Examination: General:  Intubated and sedated Neuro:  Agitated with arousal  HEENT:  ETT in place  Cardiovascular:  Sinus rhythm Lungs:  Course breath sounds bilaterally, improved Abdomen:  Soft, distended, +BS, rectal tube in place Musculoskeletal:  No edema   Ventilator settings: Vent Mode:  [-] PRVC FiO2 (%):  [50 %] 50 % Set Rate:  [24 bmp-26 bmp] 26 bmp Vt Set:  [500 mL-550 mL]  550 mL PEEP:  [10 cmH20] 10 cmH20 Plateau Pressure:  [20 cmH20-27 cmH20] 20 cmH20 @arterialblood @ Labs and Imaging:   Basic Metabolic Panel:  Lab 04/16/12 3086 04/15/12 0550 04/13/12 0407  NA 143 137 --  K 4.5 4.7 --  CL 98 97 --  CO2 39* 35* --  GLUCOSE 105* 164* --  BUN 40* 55* --  CREATININE 0.68 0.89 --  CALCIUM 8.9 8.2* --  MG -- -- 2.1  PHOS -- -- 3.9   Liver Function Tests:  Lab 04/13/12 0407  AST 19  ALT 16  ALKPHOS 60  BILITOT 0.4  PROT 5.7*  ALBUMIN 2.7*2.7*   CBC:  Lab 04/16/12 0446 04/15/12 0550 04/13/12 0407 04/12/12 1833  WBC 7.9 7.9 -- --  NEUTROABS -- -- 7.8* 7.6  HGB 17.7* 16.4 -- --  HCT 57.2* 53.8* -- --  MCV 97.9 98.5 -- --  PLT 138* 132* -- --   Cardiac Enzymes:  Lab 04/13/12 0407 04/13/12 0155 04/12/12 1932  CKTOTAL -- -- --  CKMB -- -- --  CKMBINDEX -- -- --  TROPONINI <0.30 <0.30 <0.30   BNP:  Lab 04/12/12 1832  PROBNP 17911.0*   CBG:  Lab 04/16/12 0423 04/16/12 0020 04/15/12 2024 04/15/12 1556 04/15/12 1133 04/15/12 0729  GLUCAP 105* 161* 234* 192* 103* 173*   Hemoglobin A1C:  Lab 04/13/12 0407  HGBA1C 6.7*    Urinalysis:  Lab 04/13/12 0033 04/12/12 1957  COLORURINE YELLOW AMBER*  LABSPEC 1.016  1.023  PHURINE 5.0 5.0  GLUCOSEU NEGATIVE NEGATIVE  HGBUR SMALL* NEGATIVE  BILIRUBINUR NEGATIVE SMALL*  KETONESUR NEGATIVE NEGATIVE  PROTEINUR NEGATIVE 100*  UROBILINOGEN 0.2 1.0  NITRITE NEGATIVE NEGATIVE  LEUKOCYTESUR NEGATIVE NEGATIVE      Lab 04/13/12 0155  LATICACIDVEN 1.6  PROCALCITON 0.29    Assessment and Plan: PULMONARY  ASSESSMENT: Long time smoker with COPD, worsening of sx over the last few days. Intubated in ED with aspiration. Likely COPD with CHF exac 1) Acute hypercapnic respiratory failure secondary to flu, COPD exacerbation. Carboxyhemoglobin 2.4  2) Aspiration at time of intubation, on abx  3) Pneumonitis vs. CAP--b/l interstitial opacities on cxr, improved at right base. 4) Hx of  COPD--with chronic tobacco abuse hx 3 pack per day 20+ years. On symbicort at home, r/o fibrosis 5) ARDS: on protocol. Elevated pCO2, permissive hypercapnia 6) Bloody secretions from aspiration. Bronched, blood likely from intubation, purulence throughout airway. Pulm /renal syndrome ruled out. PLAN:  -Continue ards protocol, Avoid rr greater 24 if able -goal to 6 cc/kg, eval plat pressures, abg now, assess if ph less 7.25, if so, add bicarb -Cont duonebz q4 scheduled and q2 prn  -In setting infection reduced solumedrol, now on Prednisone 40 daily -pcxr in am, with pos balance slight worse -consider FACTT now, as pos balance worse int changes pcxr  CARDIOVASCULAR  ASSESSMENT:  1) Hypotension--septic shock likely. Initially on Levo, added Vaso 1/7, pressures improved. Now off pressors. 2) Elevated proBNP- ProBNP on admission 17911. Trop x3 negative. ECHO with normal systolic and diastolic dysfunction, EF 50-55%.  3) Hx of HTN-- home regimen Lisinopril 40mg , Norvasc 5mg , and HCTZ 25mg  qd  PLAN:  -IVF--monitor CVP, last 8, now off pressors, start lasix to cvp 4 -hold home HTN medication  -Cont steroids, off pressors, reduce  RENAL  ASSESSMENT:  1) Acute Kidney injury--likely pre-renal in setting of volume depletion. Cr on admission 1.84, resolved. Given lasix 20 q12h x2 doses, completed, -1L No evidence for pulm renal syndrome Increased sediment clogging foley, unable to be flushed, replaced twice since admission PLAN:  -IVF kvo,  -Chem in am   GASTROINTESTINAL  ASSESSMENT:  Hx of GERD.  TF with increased residuals, still having bowel movements  PLAN:  -TF tolerated -GI PPx: Protonix   HEMATOLOGIC  ASSESSMENT:  1) Polycythemia--Hb 20.1 on admission, possibly secondary to combined polycythemia in setting of chronic tobacco abuse and secondary polycythemia in response to hypoxia. Hgb stable. Lovenox held 2/2 bloody secretions and pt on Heparin. Leukocytosis resolved.  PLAN:  -am  CBC -Sub q hep tolerated  INFECTIOUS  ASSESSMENT:  1) FLU pneumonia 2) Aspirated at time of intubation, pneumonitis vs PNA? Bronch with purulence throughout airway. Azithro d/c'd. H influenza from bronch, been on Zosyn x5 days, adequate therapy. Will stop Zosyn after today. PLAN:  Continue tamiflu, zosyn (stop after today)   ENDOCRINE  ASSESSMENT: On steroids this admission. CBGs moderately elevated.  PLAN:  -Continue SSI -CBG monitoring BID  - Continue pred -Cont ICU Hyperglycemic protocol   NEUROLOGIC  ASSESSMENT: Acute encephalopathy: Likely secondary to hypoxia / hypercarbia and possible infectious process (CAP?). [place] PLAN:  -sedated on mechanical ventilation, now off pressors, stop precedex, restart versed   -daily wua -Haldol q6h, check QTc prior to administering, if >500, MD to be notified -Chair position  CLINICAL SUMMARY: Mr. Aguinaga is a 62 year old male with hx of COPD presenting PNA< ARDS. Permissive hypercapnia, repeat abg, off pressors, kvo, agitation improved on Versed drip. Best practices /  Disposition: -->ICU status under PCCM -->full code -->Heparin for DVT Px -->Protonix for GI Px -->ventilator bundle -->TF 1/6   Genelle Gather 04/16/2012, 6:32 AM   STAFF NOTE: I, Dr Lavinia Sharps have personally reviewed patient's available data, including medical history, events of note, physical examination and test results as part of my evaluation. I have discussed with resident/NP and other care providers such as pharmacist, RN and RRT.  In addition,  I personally evaluated patient and elicited key findings of acute resp failure with underlying copd but now with flu, metapneumovirus and superimposed H Flu PNA. Significant agitation ovrnihgt. We have reduced RR on vent to prevent air trapping and added versed to control agitation  Rest per NP/medical resident whose note is outlined above and that I agree with  The patient is critically ill with multiple organ  systems failure and requires high complexity decision making for assessment and support, frequent evaluation and titration of therapies, application of advanced monitoring technologies and extensive interpretation of multiple databases.   Critical Care Time devoted to patient care services described in this note is  35  Minutes independent of teaching time Dr. Kalman Shan, M.D., [facility].C.P Pulmonary and Critical Care Medicine Staff Physician [facility] Pager: [phone], If no answer or between  15:00h - 7:00h: call [phone]  04/16/2012 4:44 PM

## 2012-04-16 NOTE — Progress Notes (Signed)
eLink Physician-Brief Progress Note Patient Name: Cody [name] DOB: Feb 26, 1951 MRN: [id number]  Date of Service  04/16/2012   HPI/Events of Note  Extreme agitation on precedex and prn fentanyl.  Shaking bed, flailing arms/legs.  Hypertensive and tachycardiac   eICU Interventions  Plan: Bolus precedex Increase precedex gtt  100 mcg fentanyl   Intervention Category Major Interventions: Delirium, psychosis, severe agitation - evaluation and management  DETERDING,ELIZABETH 04/16/2012, 1:56 AM

## 2012-04-16 NOTE — Progress Notes (Signed)
eLink Physician-Brief Progress Note Patient Name: Cody [name] DOB: 12-08-1950 MRN: [id number]  Date of Service  04/16/2012   HPI/Events of Note  Continued agitation - attempting to get OOB - several staff in room assisting in keeping patient safe.  View of vent patient TV suggests ETT leak.   eICU Interventions  Plan: Increase precedex to 2.1 mcg Additional fentanyl 100 mcg IV Start fentanyl gtt MD to evaluate ETT for leak   Intervention Category Major Interventions: Delirium, psychosis, severe agitation - evaluation and management Intermediate Interventions: Respiratory distress - evaluation and management  DETERDING,ELIZABETH 04/16/2012, 2:15 AM

## 2012-04-16 NOTE — Progress Notes (Signed)
Resident requested to leave pt. At current tidal volume.

## 2012-04-16 NOTE — Progress Notes (Signed)
Responded to room to assess aline for RN, but on arrival RT noticed that the Aline was already dislodged. RN stated that MD does not want to replace.

## 2012-04-17 ENCOUNTER — Inpatient Hospital Stay (HOSPITAL_COMMUNITY): Payer: Medicaid Other

## 2012-04-17 LAB — POCT I-STAT 3, ART BLOOD GAS (G3+)
Acid-Base Excess: 15 mmol/L — ABNORMAL HIGH (ref 0.0–2.0)
Bicarbonate: 44.1 meq/L — ABNORMAL HIGH (ref 20.0–24.0)
O2 Saturation: 91 %
Patient temperature: 99
TCO2: 46 mmol/L (ref 0–100)
TCO2: 46 mmol/L (ref 0–100)
pCO2 arterial: 69 mmHg (ref 35.0–45.0)
pCO2 arterial: 90.5 mmHg (ref 35.0–45.0)
pH, Arterial: 7.415 (ref 7.350–7.450)
pH, Arterial: 7.292 — ABNORMAL LOW (ref 7.350–7.450)
pO2, Arterial: 64 mmHg — ABNORMAL LOW (ref 80.0–100.0)

## 2012-04-17 LAB — GLUCOSE, CAPILLARY
Glucose-Capillary: 120 mg/dL — ABNORMAL HIGH (ref 70–99)
Glucose-Capillary: 125 mg/dL — ABNORMAL HIGH (ref 70–99)
Glucose-Capillary: 167 mg/dL — ABNORMAL HIGH (ref 70–99)
Glucose-Capillary: 200 mg/dL — ABNORMAL HIGH (ref 70–99)

## 2012-04-17 LAB — CBC
MCH: 29.9 pg (ref 26.0–34.0)
MCHC: 30.1 g/dL (ref 30.0–36.0)
Platelets: 132 10*3/uL — ABNORMAL LOW (ref 150–400)
RDW: 18.2 % — ABNORMAL HIGH (ref 11.5–15.5)

## 2012-04-17 MED ORDER — HALOPERIDOL LACTATE 5 MG/ML IJ SOLN
5.0000 mg | Freq: Four times a day (QID) | INTRAMUSCULAR | Status: DC
  Administered 2012-04-17 – 2012-04-21 (×16): 5 mg via INTRAVENOUS
  Filled 2012-04-17 (×21): qty 1

## 2012-04-17 NOTE — Progress Notes (Signed)
UR Completed.  Gesselle Fitzsimons Jane [phone] 04/17/2012  

## 2012-04-17 NOTE — Progress Notes (Signed)
Name: Cody [name] MRN: [id number] DOB: [birth year] LOS: 5  PCCM RESIDENT DAILY PROGRESS NOTE  History of Present Illness:  Cody [name] is a 62 year old white male with PMH of COPD, 3 pack per day smoking history 20+ years, and HTN presenting acute rsp failure, COPD exac, atypical PNA and aspiration durign ett.  Lines / Drains: ETT 1/5 >> L IJ 1/5 >> R A-line 1/5 >>1/8 pulled out by pt  Cultures: Blood culture 1/5>> Resp culture 1/6>> H. influenzae Resp viral panel 1/6>> Influenza A and Metapneumovirus Sputum Leigonella Culture 1/6>> Bronch Sputum Influenza 1/6>> Bronch Sputum 1/6>>  H. influenzae Urine 1/5>> Neg Urine 1/6>> Neg Flu 1/5>> Neg   Antibiotics: Zosyn 1/5>> 1/9 Azithro 1/5>>1/8 Vanc 1/6>>>1/8 Tamiflu 1/6>>> Rocephin 1/9>>  Tests / Events: 1/5 Intubated in ED 1/5 Shock , levophed 1/6 ECHO - 55%, mod dil rv, PA pressures>>> not measured b/c no TR jet 1/6 Bronched- diffuse pus all lobes 1/7 increasing pCO2, increased RR on vent 1/8 Changed sedation to Precedex, more agitated than with versed 1/9 Changed sedation back to Versed, agitation greatly improved  SUBJECTIVE/OVERNIGHT/INTERVAL HX 1/10: Overnight ABG with decline in pH. RR increased throughout the night back to 24. RASS -2  Vital Signs: Temp:  [99 F (37.2 C)-99.8 F (37.7 C)] 99.8 F (37.7 C) (01/10 0348) Pulse Rate:  [67-123] 86  (01/10 0600) Resp:  [14-28] 24  (01/10 0600) BP: (100-142)/(62-88) 119/78 mmHg (01/10 0600) SpO2:  [92 %-96 %] 93 % (01/10 0600) FiO2 (%):  [50 %] 50 % (01/10 0600) Weight:  [234 lb 12.6 oz (106.5 kg)] 234 lb 12.6 oz (106.5 kg) (01/10 0500) I/O last 3 completed shifts: In: 4027.2 [I.V.:2192.2; NG/GT:1575; IV Piggyback:260] Out: 5765 [Urine:5765]  Physical Examination: General:  Intubated and sedated Neuro:  Agitated with arousal, improved  HEENT:  ETT in place  Cardiovascular:  Sinus rhythm Lungs:  Scattered rhonchi on the vent, improved Abdomen:  Soft,  distended, +BS, rectal tube in place Musculoskeletal:  No edema   Ventilator settings: Vent Mode:  [-] PRVC FiO2 (%):  [50 %] 50 % Set Rate:  [14 bmp-26 bmp] 24 bmp Vt Set:  [550 mL] 550 mL PEEP:  [10 cmH20] 10 cmH20 Plateau Pressure:  [11 cmH20-24 cmH20] 22 cmH20 @arterialblood @ Labs and Imaging:   Basic Metabolic Panel:  Lab 04/16/12 4098 04/15/12 0550 04/13/12 0407  NA 143 137 --  K 4.5 4.7 --  CL 98 97 --  CO2 39* 35* --  GLUCOSE 105* 164* --  BUN 40* 55* --  CREATININE 0.68 0.89 --  CALCIUM 8.9 8.2* --  MG -- -- 2.1  PHOS -- -- 3.9   Liver Function Tests:  Lab 04/13/12 0407  AST 19  ALT 16  ALKPHOS 60  BILITOT 0.4  PROT 5.7*  ALBUMIN 2.7*2.7*   CBC:  Lab 04/17/12 0355 04/16/12 0446 04/13/12 0407 04/12/12 1833  WBC 7.1 7.9 -- --  NEUTROABS -- -- 7.8* 7.6  HGB 16.5 17.7* -- --  HCT 54.8* 57.2* -- --  MCV 99.3 97.9 -- --  PLT 132* 138* -- --   Cardiac Enzymes:  Lab 04/13/12 0407 04/13/12 0155 04/12/12 1932  CKTOTAL -- -- --  CKMB -- -- --  CKMBINDEX -- -- --  TROPONINI <0.30 <0.30 <0.30   BNP:  Lab 04/12/12 1832  PROBNP 17911.0*   CBG:  Lab 04/17/12 0346 04/17/12 0043 04/16/12 2034 04/16/12 1623 04/16/12 1205 04/16/12 0742  GLUCAP 108* 120* 119* 144* 171* 106*  Hemoglobin A1C:  Lab 04/13/12 0407  HGBA1C 6.7*    Urinalysis:  Lab 04/13/12 0033 04/12/12 1957  COLORURINE YELLOW AMBER*  LABSPEC 1.016 1.023  PHURINE 5.0 5.0  GLUCOSEU NEGATIVE NEGATIVE  HGBUR SMALL* NEGATIVE  BILIRUBINUR NEGATIVE SMALL*  KETONESUR NEGATIVE NEGATIVE  PROTEINUR NEGATIVE 100*  UROBILINOGEN 0.2 1.0  NITRITE NEGATIVE NEGATIVE  LEUKOCYTESUR NEGATIVE NEGATIVE      Lab 04/13/12 0155  LATICACIDVEN 1.6  PROCALCITON 0.29    Assessment and Plan: PULMONARY  ASSESSMENT:  1) Acute hypercapnic respiratory failure/ARDS  - secondary to flu, COPD exacerbation  - On ARDS protocol, allow for permissive hypercapnea 2) Aspiration   - at time of intubation,  finished course of treatment 3) Pneumonitis vs. CAP  -b/l interstitial opacities on cxr, slightly increased at left, likely atelectasis. 4) h/o COPD  - h/o chronic tobacco abuse hx 3 pack per day 20+ years. On symbicort at home 6) Bloody secretions from aspiration.    - Pulm /renal syndrome ruled out. PLAN:  -Continue ards protocol, keep rate at 14, reassess if pH less 7.22 -Cont duonebz q4 scheduled and q2 prn  -In setting infection stopped solumedrol, now on Prednisone 40 daily -pcxr in am -f/u ABG   CARDIOVASCULAR  ASSESSMENT:  1) Hypotension  - septic shock likely, off pressors. 2) Elevated proBNP  - ProBNP on admission 17911. Trop x3 negative. ECHO with normal systolic and diastolic dysfunction, EF 50-55%.  3) Hx of HTN  -home regimen Lisinopril 40mg , Norvasc 5mg , and HCTZ 25mg  qd  PLAN:  -KVO -hold home HTN medication  -Cont steroids, off pressors, reduce  RENAL  ASSESSMENT:  1) Acute Kidney injury  - likely pre-renal in setting of volume depletion.   - resolved. No evidence for pulm renal syndrome  - Urinary sediment clogging foley, improved PLAN:  -IVF kvo,  -Chem in am   GASTROINTESTINAL  ASSESSMENT:  Hx of GERD.  TF, still having bowel movements, not as frequently  PLAN:  -TF tolerated -GI PPx: Protonix   HEMATOLOGIC  ASSESSMENT:  Polycythemia-  - Hgb stable.  Leukocytosis  - resolved.  PLAN:  -am CBC -Sub q hep  INFECTIOUS  ASSESSMENT:  1) FLU pneumonia 2) Aspirated at time of intubation, pneumonitis vs PNA?  PLAN:  Continue tamiflu, zosyn (stop after today) Continue Rocephin day 6/8    ENDOCRINE  ASSESSMENT: On steroids this admission. CBGs stable.  PLAN:  -Continue SSI -CBG monitoring BID  -Continue pred -Cont ICU Hyperglycemic protocol   NEUROLOGIC  ASSESSMENT:  Acute encephalopathy:   - Likely secondary to hypoxia / hypercarbia and possible infectious process (CAP?).   Delerium  -sedated on mechanical ventilation, remains  off pressors, off precedex, restart versed   PLAN:  -Cont Versed -daily wua -Haldol q6h PRN, check QTc prior to administering, if >500, MD to be notified -Chair position  CLINICAL SUMMARY: Cody [name] is a 62 year old male with hx of COPD presenting PNA< ARDS. Permissive hypercapnia, repeat abg, off pressors, kvo, agitation improved on Versed drip. Best practices / Disposition: -->ICU status under PCCM -->full code -->Heparin for DVT Px -->Protonix for GI Px -->ventilator bundle -->TF 1/6 - no family at bedside 04/17/2012   Genelle Gather 04/17/2012, 7:38 AM   STAFF NOTE: I, Dr Lavinia Sharps have personally reviewed patient's available data, including medical history, events of note, physical examination and test results as part of my evaluation. I have discussed with resident/NP and other care providers such as pharmacist, RN and RRT.  In  addition,  I personally evaluated patient and elicited key findings of acute resp failure with underlying copd but now with flu, metapneumovirus and superimposed H Flu PNA.  On 04/17/2012 Agitation controleld with versed gtt but he is getting agitated on WUA. Will add scheduled haldol. Worried if delirium continues will head to trach. Will also reduce RR to  prevent air trapping; accept PCO2  90 as long as pH is fine.  NP/medical resident whose note is outlined above and that I agree with  The patient is critically ill with multiple organ systems failure and requires high complexity decision making for assessment and support, frequent evaluation and titration of therapies, application of advanced monitoring technologies and extensive interpretation of multiple databases.   Critical Care Time devoted to patient care services described in this note is  35  Minutes independent of teaching time Dr. Kalman Shan, M.D., [facility].C.P Pulmonary and Critical Care Medicine Staff Physician  [facility] [place] [facility] Pager: [phone],  If no answer or between  15:00h - 7:00h: call [phone]  04/17/2012 7:38 AM

## 2012-04-18 ENCOUNTER — Inpatient Hospital Stay (HOSPITAL_COMMUNITY): Payer: Medicaid Other

## 2012-04-18 LAB — CBC WITH DIFFERENTIAL/PLATELET
Basophils Absolute: 0 10*3/uL (ref 0.0–0.1)
Basophils Relative: 0 % (ref 0–1)
Hemoglobin: 17 g/dL (ref 13.0–17.0)
MCHC: 29.9 g/dL — ABNORMAL LOW (ref 30.0–36.0)
Neutro Abs: 5.3 10*3/uL (ref 1.7–7.7)
Neutrophils Relative %: 65 % (ref 43–77)
RDW: 18.1 % — ABNORMAL HIGH (ref 11.5–15.5)

## 2012-04-18 LAB — MAGNESIUM: Magnesium: 2.2 mg/dL (ref 1.5–2.5)

## 2012-04-18 LAB — GLUCOSE, CAPILLARY: Glucose-Capillary: 128 mg/dL — ABNORMAL HIGH (ref 70–99)

## 2012-04-18 LAB — BLOOD GAS, ARTERIAL
Bicarbonate: 43.9 mEq/L — ABNORMAL HIGH (ref 20.0–24.0)
TCO2: 46.5 mmol/L (ref 0–100)
pCO2 arterial: 84.2 mmHg (ref 35.0–45.0)
pH, Arterial: 7.337 — ABNORMAL LOW (ref 7.350–7.450)

## 2012-04-18 LAB — BASIC METABOLIC PANEL
Chloride: 99 mEq/L (ref 96–112)
GFR calc Af Amer: >90 mL/min (ref >90)
Potassium: 4.6 mEq/L (ref 3.5–5.1)
Sodium: 146 mEq/L — ABNORMAL HIGH (ref 135–145)

## 2012-04-18 NOTE — Progress Notes (Signed)
ABG reviewed   RR increased to 20

## 2012-04-18 NOTE — Progress Notes (Signed)
Name: Cody [name] MRN: [id number] DOB: 05/03/1950 LOS: 6  PCCM RESIDENT DAILY PROGRESS NOTE  History of Present Illness:  Mr. Cody [name] is a 62 year old white male with PMH of COPD, 3 pack per day smoking history 20+ years, and HTN presenting acute rsp failure, COPD exac, atypical PNA and aspiration durign ett.  Lines / Drains: ETT 1/5 >> L IJ 1/5 >> R A-line 1/5 >>1/8 pulled out by pt  Cultures: Blood culture 1/5>> Resp culture 1/6>> H. influenzae Resp viral panel 1/6>> Influenza A and Metapneumovirus Sputum Leigonella Culture 1/6>> Bronch Sputum Influenza 1/6>> Bronch Sputum 1/6>>  H. influenzae Urine 1/5>> Neg Urine 1/6>> Neg Flu 1/5>> Neg   Antibiotics: Zosyn 1/5>> 1/9 Azithro 1/5>>1/8 Vanc 1/6>>>1/8 Tamiflu 1/6>>> Rocephin 1/9>>  Tests / Events: 1/5 Intubated in ED 1/5 Shock , levophed 1/6 ECHO - 55%, mod dil rv, PA pressures>>> not measured b/c no TR jet 1/6 Bronched- diffuse pus all lobes 1/7 increasing pCO2, increased RR on vent 1/8 Changed sedation to Precedex, more agitated than with versed 1/9 Changed sedation back to Versed, agitation greatly improved 1/10 scheduled haldol  SUBJECTIVE/OVERNIGHT/INTERVAL HX  1/11 - improving agitation and versed need improved but still agitated on WUA  Vital Signs: Temp:  [98.2 F (36.8 C)-99.3 F (37.4 C)] 98.3 F (36.8 C) (01/11 0801) Pulse Rate:  [75-105] 97  (01/11 1200) Resp:  [13-21] 20  (01/11 1200) BP: (123-180)/(73-96) 151/77 mmHg (01/11 1200) SpO2:  [92 %-96 %] 95 % (01/11 1537) FiO2 (%):  [50 %] 50 % (01/11 1537) Weight:  [105 kg (231 lb 7.7 oz)] 105 kg (231 lb 7.7 oz) (01/11 0400) I/O last 3 completed shifts: In: 3619.6 [I.V.:1674.6; NG/GT:1835; IV Piggyback:110] Out: 3160 [Urine:3160]  Physical Examination: General:  Intubated and sedated Neuro:  Agitated with arousal, improved  HEENT:  ETT in place  Cardiovascular:  Sinus rhythm Lungs:  Scattered rhonchi on the vent, improved Abdomen:   Soft, distended, +BS, rectal tube in place Musculoskeletal:  No edema   Ventilator settings: Vent Mode:  [-] PRVC FiO2 (%):  [50 %] 50 % Set Rate:  [14 bmp-20 bmp] 14 bmp Vt Set:  [550 mL] 550 mL PEEP:  [5 cmH20-10 cmH20] 5 cmH20 Plateau Pressure:  [13 cmH20-21 cmH20] 13 cmH20 @arterialblood @ Labs and Imaging:   Basic Metabolic Panel:  Lab 04/18/12 4098 04/16/12 0446 04/13/12 0407  NA 146* 143 --  K 4.6 4.5 --  CL 99 98 --  CO2 42* 39* --  GLUCOSE 130* 105* --  BUN 37* 40* --  CREATININE 0.65 0.68 --  CALCIUM 9.4 8.9 --  MG 2.2 -- 2.1  PHOS 3.2 -- 3.9   Liver Function Tests:  Lab 04/13/12 0407  AST 19  ALT 16  ALKPHOS 60  BILITOT 0.4  PROT 5.7*  ALBUMIN 2.7*2.7*   CBC:  Lab 04/18/12 0433 04/17/12 0355 04/13/12 0407  WBC 8.0 7.1 --  NEUTROABS 5.3 -- 7.8*  HGB 17.0 16.5 --  HCT 56.9* 54.8* --  MCV 100.7* 99.3 --  PLT 158 132* --   Cardiac Enzymes:  Lab 04/13/12 0407 04/13/12 0155 04/12/12 1932  CKTOTAL -- -- --  CKMB -- -- --  CKMBINDEX -- -- --  TROPONINI <0.30 <0.30 <0.30   BNP:  Lab 04/12/12 1832  PROBNP 17911.0*   CBG:  Lab 04/18/12 0743 04/18/12 0433 04/18/12 0037 04/17/12 2016 04/17/12 1617 04/17/12 1220  GLUCAP 150* 97 128* 125* 167* 200*   Hemoglobin A1C:  Lab 04/13/12 0407  HGBA1C 6.7*    Urinalysis:  Lab 04/13/12 0033 04/12/12 1957  COLORURINE YELLOW AMBER*  LABSPEC 1.016 1.023  PHURINE 5.0 5.0  GLUCOSEU NEGATIVE NEGATIVE  HGBUR SMALL* NEGATIVE  BILIRUBINUR NEGATIVE SMALL*  KETONESUR NEGATIVE NEGATIVE  PROTEINUR NEGATIVE 100*  UROBILINOGEN 0.2 1.0  NITRITE NEGATIVE NEGATIVE  LEUKOCYTESUR NEGATIVE NEGATIVE      Lab 04/13/12 0155  LATICACIDVEN 1.6  PROCALCITON 0.29    Assessment and Plan: PULMONARY  ASSESSMENT:  1) Acute hypercapnic respiratory failure/ARDS  - secondary to flu, COPD exacerbation  -  2) Aspiration   - at time of intubation, finished course of treatment 3) Pneumonitis vs. CAP  -b/l  interstitial opacities on cxr, slightly increased at left, likely atelectasis. 4) h/o COPD  - h/o chronic tobacco abuse hx 3 pack per day 20+ years. On symbicort at home 6) Bloody secretions from aspiration.    - Pulm /renal syndrome ruled out. PLAN:  -Continue permissive hypercapnia, reassess if pH less 7.22 -Cont duonebz q4 scheduled and q2 prn  -In setting infection stopped solumedrol, now on Prednisone 40 daily -pcxr in am -f/u ABG   CARDIOVASCULAR  ASSESSMENT:  1) Hypotension  - septic shock likely, off pressors. 2) Elevated proBNP  - ProBNP on admission 17911. Trop x3 negative. ECHO with normal systolic and diastolic dysfunction, EF 50-55%.  3) Hx of HTN  -home regimen Lisinopril 40mg , Norvasc 5mg , and HCTZ 25mg  qd  PLAN:  -KVO -hold home HTN medication  -Cont steroids but will need tapering over 2 weeks starting 04/19/12   RENAL  ASSESSMENT:  1) Acute Kidney injury  - likely pre-renal in setting of volume depletion.   - resolved. No evidence for pulm renal syndrome  - Urinary sediment clogging foley, improved PLAN:  -IVF kvo,  -Chem in am   GASTROINTESTINAL  ASSESSMENT:  Hx of GERD.  TF, still having bowel movements, not as frequently  PLAN:  -TF tolerated -GI PPx: Protonix   HEMATOLOGIC  ASSESSMENT:  Polycythemia-  - Hgb stable.  Leukocytosis  - resolved.  PLAN:  -am CBC -Sub q hep  INFECTIOUS  ASSESSMENT:  1) FLU pneumonia 2) Aspirated at time of intubation, pneumonitis vs PNA?  PLAN:  Continue tamiflu, zosyn (stop after today) Continue Rocephin day 6/8    ENDOCRINE  ASSESSMENT: On steroids this admission. CBGs stable.  PLAN:  -Continue SSI -CBG monitoring BID  -Continue pred -Cont ICU Hyperglycemic protocol   NEUROLOGIC  ASSESSMENT:  Acute encephalopathy:   - Likely secondary to hypoxia / hypercarbia and possible infectious process (CAP?).   Delerium  -sedated on mechanical ventilation, remains off pressors, off precedex, restart  versed   PLAN:  -Cont Versed -daily wua -Haldol q6h scheduled check QTc prior to administering, if >500, MD to be notified -Chair position  CLINICAL SUMMARY: Mr. Cody [name] is a 62 year old male with hx of COPD presenting PNA< ARDS. Permissive hypercapnia, repeat abg, off pressors, kvo, agitation improved on Versed drip. Best practices / Disposition: -->ICU status under PCCM -->full code -->Heparin for DVT Px -->Protonix for GI Px -->ventilator bundle -->TF 1/6 - no family at bedside 04/18/2012   The patient is critically ill with multiple organ systems failure and requires high complexity decision making for assessment and support, frequent evaluation and titration of therapies, application of advanced monitoring technologies and extensive interpretation of multiple databases.   Critical Care Time devoted to patient care services described in this note is  35  Minutes independent of teaching time Dr. Kalman Shan,  M.D., F.C.C.P Pulmonary and Critical Care Medicine Staff Physician [place] [facility] [place] [facility] Pager: [phone], If no answer or between  15:00h - 7:00h: call [phone]  04/18/2012 4:04 PM

## 2012-04-19 ENCOUNTER — Inpatient Hospital Stay (HOSPITAL_COMMUNITY): Payer: Medicaid Other

## 2012-04-19 LAB — GLUCOSE, CAPILLARY
Glucose-Capillary: 120 mg/dL — ABNORMAL HIGH (ref 70–99)
Glucose-Capillary: 128 mg/dL — ABNORMAL HIGH (ref 70–99)
Glucose-Capillary: 142 mg/dL — ABNORMAL HIGH (ref 70–99)
Glucose-Capillary: 197 mg/dL — ABNORMAL HIGH (ref 70–99)

## 2012-04-19 LAB — LEGIONELLA CULTURE

## 2012-04-19 LAB — CULTURE, BLOOD (ROUTINE X 2): Culture: NO GROWTH

## 2012-04-19 LAB — PRO B NATRIURETIC PEPTIDE: Pro B Natriuretic peptide (BNP): 398.2 pg/mL — ABNORMAL HIGH (ref 0–125)

## 2012-04-19 LAB — CBC WITH DIFFERENTIAL/PLATELET
Eosinophils Absolute: 0.1 10*3/uL (ref 0.0–0.7)
Eosinophils Relative: 1 % (ref 0–5)
HCT: 55.8 % — ABNORMAL HIGH (ref 39.0–52.0)
Hemoglobin: 16.6 g/dL (ref 13.0–17.0)
Lymphocytes Relative: 11 % — ABNORMAL LOW (ref 12–46)
Lymphs Abs: 1.2 10*3/uL (ref 0.7–4.0)
MCH: 30 pg (ref 26.0–34.0)
MCV: 100.7 fL — ABNORMAL HIGH (ref 78.0–100.0)
Monocytes Absolute: 1.2 10*3/uL — ABNORMAL HIGH (ref 0.1–1.0)
Monocytes Relative: 12 % (ref 3–12)
RBC: 5.54 MIL/uL (ref 4.22–5.81)
WBC: 10.5 10*3/uL (ref 4.0–10.5)

## 2012-04-19 LAB — BASIC METABOLIC PANEL
BUN: 33 mg/dL — ABNORMAL HIGH (ref 6–23)
CO2: 40 mEq/L (ref 19–32)
Calcium: 9.3 mg/dL (ref 8.4–10.5)
GFR calc non Af Amer: >90 mL/min (ref >90)
Glucose, Bld: 117 mg/dL — ABNORMAL HIGH (ref 70–99)

## 2012-04-19 MED ORDER — FUROSEMIDE 10 MG/ML IJ SOLN
20.0000 mg | Freq: Once | INTRAMUSCULAR | Status: AC
  Administered 2012-04-19: 20 mg via INTRAVENOUS
  Filled 2012-04-19: qty 2

## 2012-04-19 MED ORDER — DEXMEDETOMIDINE HCL IN NACL 200 MCG/50ML IV SOLN
0.2000 ug/kg/h | INTRAVENOUS | Status: DC
  Administered 2012-04-19: 0.9 ug/kg/h via INTRAVENOUS
  Administered 2012-04-19: 0.6 ug/kg/h via INTRAVENOUS
  Administered 2012-04-19: 0.4 ug/kg/h via INTRAVENOUS
  Administered 2012-04-19: 0.8 ug/kg/h via INTRAVENOUS
  Administered 2012-04-19: 0.6 ug/kg/h via INTRAVENOUS
  Administered 2012-04-19: 0.4 ug/kg/h via INTRAVENOUS
  Administered 2012-04-20 (×2): 0.6 ug/kg/h via INTRAVENOUS
  Filled 2012-04-19 (×10): qty 50

## 2012-04-19 MED ORDER — ACETAMINOPHEN 160 MG/5ML PO LIQD: 160.0000 mg | Freq: Four times a day (QID) | ORAL | Status: DC | PRN

## 2012-04-19 MED ORDER — ACETAMINOPHEN 160 MG/5ML PO SOLN
325.0000 mg | Freq: Four times a day (QID) | ORAL | Status: DC | PRN
  Administered 2012-04-19: 325 mg via ORAL
  Filled 2012-04-19: qty 20.3

## 2012-04-19 NOTE — Progress Notes (Signed)
CRITICAL VALUE ALERT  Critical value received: Serum CO2 40  Date of notification:  04-19-12  Time of notification:  0530  Critical value read back:yes  Nurse who received alert:  Corliss Skains RN  MD notified (1st page):  Dr. Darrick Penna  Time of first page:  [phone]  MD notified (2nd page):  Time of second page:  Responding MD:  Dr. Darrick Penna  Time MD responded:  [phone]

## 2012-04-19 NOTE — Progress Notes (Signed)
Name: Cody [name] MRN: [id number] DOB: 1950-05-07 LOS: 7  PCCM RESIDENT DAILY PROGRESS NOTE  History of Present Illness:  Cody [name] is a 62 year old white male with PMH of COPD, 3 pack per day smoking history 20+ years, and HTN presenting acute rsp failure, COPD exac, atypical PNA and aspiration during ett.  Lines / Drains: ETT 1/5 >> L IJ 1/5 >> R A-line 1/5 >>1/8 pulled out by pt  Cultures: Blood culture 1/5>> Resp culture 1/6>> H. influenzae Resp viral panel 1/6>> Influenza A and Metapneumovirus Sputum Leigonella Culture 1/6>> Bronch Sputum Influenza 1/6>> Bronch Sputum 1/6>>  H. influenzae Urine 1/5>> Neg Urine 1/6>> Neg Flu 1/5>> Neg   Antibiotics: Zosyn 1/5>> 1/9 Azithro 1/5>>1/8 Vanc 1/6>>>1/8 Tamiflu 1/6>>> Rocephin 1/9>>  Tests / Events: 1/5 Intubated in ED 1/5 Shock , levophed 1/6 ECHO - 55%, mod dil rv, PA pressures>>> not measured b/c no TR jet 1/6 Bronched- diffuse pus all lobes 1/7 increasing pCO2, increased RR on vent 1/8 Changed sedation to Precedex, more agitated than with versed 1/9 Changed sedation back to Versed, agitation greatly improved 1/10 scheduled haldol 1/12 Restarting precedex gtt due to persistent agitation   SUBJECTIVE/OVERNIGHT/INTERVAL HX  01/12 Remains sedated on versed/ Fentanyl, with decreasing versed, having increasing agitation.  Vital Signs: Temp:  [98.3 F (36.8 C)-100 F (37.8 C)] 99.7 F (37.6 C) (01/12 0400) Pulse Rate:  [88-126] 96  (01/12 0600) Resp:  [13-22] 13  (01/12 0600) BP: (119-180)/(64-94) 138/78 mmHg (01/12 0600) SpO2:  [90 %-96 %] 92 % (01/12 0600) FiO2 (%):  [40 %-50 %] 50 % (01/12 0700) Weight:  [233 lb 11 oz (106 kg)] 233 lb 11 oz (106 kg) (01/12 0400) I/O last 3 completed shifts: In: 3171.2 [I.V.:1371.2; NG/GT:1680; IV Piggyback:120] Out: 3520 [Urine:3520]  Physical Examination: General:  Intubated and sedated Neuro:  Agitated with arousal, improved  HEENT:  ETT in place    Cardiovascular:  Sinus rhythm Lungs:  Scattered rhonchi on the vent, improved Abdomen:  Soft, distended, +BS. Musculoskeletal:  1+ pretibial edema   Ventilator settings: Vent Mode:  [-] PRVC FiO2 (%):  [40 %-50 %] 50 % Set Rate:  [14 bmp-20 bmp] 14 bmp Vt Set:  [550 mL] 550 mL PEEP:  [5 cmH20-10 cmH20] 5 cmH20 Plateau Pressure:  [12 cmH20-21 cmH20] 13 cmH20 ABG:     Component Value Date/Time   PHART 7.337* 04/18/2012 0240   PCO2ART 84.2* 04/18/2012 0240   PO2ART 92.9 04/18/2012 0240   HCO3 43.9* 04/18/2012 0240   TCO2 46.5 04/18/2012 0240   ACIDBASEDEF 1.0 04/12/2012 2013   O2SAT 97.2 04/18/2012 0240    Labs and Imaging:   Basic Metabolic Panel:  Lab 04/19/12 7829 04/18/12 0433 04/13/12 0407  NA 146* 146* --  K 4.6 4.6 --  CL 101 99 --  CO2 40* 42* --  GLUCOSE 117* 130* --  BUN 33* 37* --  CREATININE 0.65 0.65 --  CALCIUM 9.3 9.4 --  MG -- 2.2 2.1  PHOS 3.4 3.2 --   Liver Function Tests:  Lab 04/13/12 0407  AST 19  ALT 16  ALKPHOS 60  BILITOT 0.4  PROT 5.7*  ALBUMIN 2.7*2.7*   CBC:  Lab 04/19/12 0435 04/18/12 0433  WBC 10.5 8.0  NEUTROABS 8.0* 5.3  HGB 16.6 17.0  HCT 55.8* 56.9*  MCV 100.7* 100.7*  PLT 149* 158   Cardiac Enzymes:  Lab 04/13/12 0407 04/13/12 0155 04/12/12 1932  CKTOTAL -- -- --  CKMB -- -- --  CKMBINDEX -- -- --  TROPONINI <0.30 <0.30 <0.30   BNP:  Lab 04/12/12 1832  PROBNP 17911.0*   CBG:  Lab 04/19/12 0344 04/18/12 2348 04/18/12 2012 04/18/12 1552 04/18/12 1225 04/18/12 0743  GLUCAP 128* 120* 129* 166* 227* 150*   Hemoglobin A1C:  Lab 04/13/12 0407  HGBA1C 6.7*    Urinalysis:  Lab 04/13/12 0033 04/12/12 1957  COLORURINE YELLOW AMBER*  LABSPEC 1.016 1.023  PHURINE 5.0 5.0  GLUCOSEU NEGATIVE NEGATIVE  HGBUR SMALL* NEGATIVE  BILIRUBINUR NEGATIVE SMALL*  KETONESUR NEGATIVE NEGATIVE  PROTEINUR NEGATIVE 100*  UROBILINOGEN 0.2 1.0  NITRITE NEGATIVE NEGATIVE  LEUKOCYTESUR NEGATIVE NEGATIVE      Lab 04/13/12  0155  LATICACIDVEN 1.6  PROCALCITON 0.29    Assessment and Plan: PULMONARY  ASSESSMENT:  1) Acute hypercapnic respiratory failure/ARDS - secondary to flu, COPD exacerbation  2) Aspiration - at time of intubation, finished course of treatment 3) Pneumonitis vs. CAP - b/l interstitial opacities on cxr, slightly increased at left, likely atelectasis. 4) Pulmonary edema - worsening per CXR this AM. 4) H/o COPD - h/o chronic tobacco abuse hx 3 pack per day 20+ years. On symbicort at home 5) Bloody secretions from aspiration - Pulm /renal syndrome ruled out.  PLAN:  - Continue permissive hypercapnia, reassess if pH less 7.22 - Cont duonebz q4 scheduled and q2 prn  - In setting infection stopped solumedrol, now on Prednisone 40 daily - IV lasix, NSL IVF - pcxr in am   CARDIOVASCULAR  ASSESSMENT:  1) Hypotension- septic shock likely, off pressors. Resolved. 2) Elevated proBNP - ProBNP on admission 17911. Trop x3 negative. ECHO with normal systolic and diastolic dysfunction, EF 50-55%.  3) Hx of HTN - home regimen Lisinopril 40mg , Norvasc 5mg , and HCTZ 25mg  qd  PLAN:  - KVO - IV Lasix. - proBNP in AM. - Hold home HTN medication  - Cont steroids but will need tapering over 2 weeks starting 04/19/12   RENAL  ASSESSMENT:  1) Acute Kidney injury - Resolved. Previously likely pre-renal in setting of volume depletion. Previously urinary sediment clogging foley, improved.  PLAN:  -IVF kvo -Chem in am   GASTROINTESTINAL  ASSESSMENT:  Hx of GERD.  TF, still having bowel movements, not as frequently  PLAN:  -TF tolerated -GI PPx: Protonix   HEMATOLOGIC  ASSESSMENT:  1) Polycythemia- Hgb stable.  Leukocytosis - resolved.  PLAN:  - Am CBC - Sub q hep  INFECTIOUS  ASSESSMENT:  1) FLU pneumonia 2) Aspirated at time of intubation, pneumonitis vs PNA?  PLAN:  - Continue tamiflu - day 7/10 - Continue Rocephin day 6/8    ENDOCRINE  CBG (last 3)   Basename 04/19/12 0344  04/18/12 2348 04/18/12 2012  GLUCAP 128* 120* 129*   ASSESSMENT: On steroids this admission. CBGs stable.  PLAN:  - Continue SSI - CBG monitoring BID  - Continue prednisone - day 5 - Cont ICU Hyperglycemic protocol   NEUROLOGIC   ASSESSMENT:  Acute encephalopathy - Likely secondary to hypoxia / hypercarbia and possible infectious process (CAP?). Delerium - sedated on mechanical ventilation, remains off pressors, off precedex, well tolerating versed / fentanyl with minimal agitation - hope that with haldol on board, will better tolerate the precedex. PLAN:  - Daily WUA - Haldol q6h scheduled (since 01/09) - checking every other day EKG if >500, MD to be notified - Will restart precedex protocol, will continue fentanyl and wean versed to off.  - Chair position   CLINICAL SUMMARY: Cody [name]  is a 62 year old male with hx of COPD presenting PNA< ARDS. Permissive hypercapnia, repeat abg, off pressors, kvo, agitation improved on versed drip.   Best practices / Disposition: -->ICU status under PCCM -->full code -->Heparin for DVT Px -->Protonix for GI Px -->ventilator bundle -->TF 1/6   GLOBAL: - no family at bedside 04/19/2012 - CODE STATUS - per Dr. Waynard Reeds discussion with the patient's son Minerva Areola on 04/13/2012: Minerva Areola claims his family has not had a formal discussion, however, he would like him to be full code at this time. He does go on to say that the only discussion his father has had with him in the past is that Cody [name] does not wish to stay on prolonged life support if there is no hope for recovery. Minerva Areola says he will continue to discuss the matter further with his family and notify us of any changes if they decide.    Signed: Johnette Abraham, Roma Schanz, Internal Medicine Resident Pager: [phone] (7AM-5PM) 04/19/2012, 7:41 AM     The patient's case and plan of care was discussed with attending physician, Dr. Kalman Shan.    STAFF NOTE: I, Dr Lavinia Sharps  have personally reviewed patient's available data, including medical history, events of note, physical examination and test results as part of my evaluation. I have discussed with resident/NP and other care providers such as pharmacist, RN and RRT.  In addition,  I personally evaluated patient and elicited key findings of  Acute resp failuer due to flu, metapneumovirus and CAP. Main issue is failure to wean due to agitation despite versed, haldo. Will add precedex.  Rest per NP/medical resident whose note is outlined above and that I agree with  The patient is critically ill with multiple organ systems failure and requires high complexity decision making for assessment and support, frequent evaluation and titration of therapies, application of advanced monitoring technologies and extensive interpretation of multiple databases.   Critical Care Time devoted to patient care services described in this note is 31  Minutes.  Dr. Kalman Shan, M.D., [facility].C.P Pulmonary and Critical Care Medicine Staff Physician [place] [facility] [place] [facility] Pager: [phone], If no answer or between  15:00h - 7:00h: call [phone]  04/19/2012 4:34 PM

## 2012-04-20 ENCOUNTER — Inpatient Hospital Stay (HOSPITAL_COMMUNITY): Payer: Medicaid Other

## 2012-04-20 DIAGNOSIS — J449 Chronic obstructive pulmonary disease, unspecified: Secondary | ICD-10-CM

## 2012-04-20 DIAGNOSIS — J4489 Other specified chronic obstructive pulmonary disease: Secondary | ICD-10-CM

## 2012-04-20 LAB — URINALYSIS, ROUTINE W REFLEX MICROSCOPIC
Hgb urine dipstick: NEGATIVE
Ketones, ur: NEGATIVE mg/dL
Protein, ur: NEGATIVE mg/dL
Urobilinogen, UA: 1 mg/dL (ref 0.0–1.0)

## 2012-04-20 LAB — POCT I-STAT 3, ART BLOOD GAS (G3+)
Acid-Base Excess: 11 mmol/L — ABNORMAL HIGH (ref 0.0–2.0)
O2 Saturation: 91 %
Patient temperature: 99.7
TCO2: 43 mmol/L (ref 0–100)
pH, Arterial: 7.353 (ref 7.350–7.450)

## 2012-04-20 LAB — CBC WITH DIFFERENTIAL/PLATELET
Lymphocytes Relative: 8 % — ABNORMAL LOW (ref 12–46)
Lymphs Abs: 0.9 10*3/uL (ref 0.7–4.0)
Neutro Abs: 9.5 10*3/uL — ABNORMAL HIGH (ref 1.7–7.7)
Neutrophils Relative %: 77 % (ref 43–77)
Platelets: 149 10*3/uL — ABNORMAL LOW (ref 150–400)
RBC: 5.83 MIL/uL — ABNORMAL HIGH (ref 4.22–5.81)
WBC: 12.2 10*3/uL — ABNORMAL HIGH (ref 4.0–10.5)

## 2012-04-20 LAB — BASIC METABOLIC PANEL
CO2: 39 mEq/L — ABNORMAL HIGH (ref 19–32)
GFR calc non Af Amer: >90 mL/min (ref >90)
Glucose, Bld: 142 mg/dL — ABNORMAL HIGH (ref 70–99)
Potassium: 4.3 mEq/L (ref 3.5–5.1)
Sodium: 145 mEq/L (ref 135–145)

## 2012-04-20 LAB — GLUCOSE, CAPILLARY
Glucose-Capillary: 110 mg/dL — ABNORMAL HIGH (ref 70–99)
Glucose-Capillary: 152 mg/dL — ABNORMAL HIGH (ref 70–99)
Glucose-Capillary: 208 mg/dL — ABNORMAL HIGH (ref 70–99)
Glucose-Capillary: 185 mg/dL — ABNORMAL HIGH (ref 70–99)

## 2012-04-20 LAB — MISCELLANEOUS TEST

## 2012-04-20 MED ORDER — OXEPA PO LIQD
1000.0000 mL | ORAL | Status: DC
  Administered 2012-04-20 – 2012-04-21 (×2): 1000 mL
  Filled 2012-04-20 (×4): qty 1000

## 2012-04-20 MED ORDER — PRO-STAT SUGAR FREE PO LIQD
60.0000 mL | Freq: Four times a day (QID) | ORAL | Status: DC
  Administered 2012-04-20 – 2012-04-22 (×7): 60 mL
  Filled 2012-04-20 (×10): qty 60

## 2012-04-20 MED ORDER — INSULIN GLARGINE 100 UNIT/ML ~~LOC~~ SOLN
5.0000 [IU] | Freq: Every day | SUBCUTANEOUS | Status: DC
  Administered 2012-04-20: 5 [IU] via SUBCUTANEOUS

## 2012-04-20 MED ORDER — VANCOMYCIN HCL IN DEXTROSE 1-5 GM/200ML-% IV SOLN
1000.0000 mg | Freq: Two times a day (BID) | INTRAVENOUS | Status: DC
  Administered 2012-04-20 – 2012-04-26 (×13): 1000 mg via INTRAVENOUS
  Filled 2012-04-20 (×14): qty 200

## 2012-04-20 MED ORDER — METHYLPREDNISOLONE SODIUM SUCC 40 MG IJ SOLR
40.0000 mg | Freq: Two times a day (BID) | INTRAMUSCULAR | Status: DC
  Administered 2012-04-20 – 2012-04-23 (×7): 40 mg via INTRAVENOUS
  Filled 2012-04-20 (×8): qty 1

## 2012-04-20 MED ORDER — ACETAMINOPHEN 160 MG/5ML PO SOLN
650.0000 mg | Freq: Four times a day (QID) | ORAL | Status: DC | PRN
  Administered 2012-04-20 (×2): 650 mg
  Filled 2012-04-20 (×2): qty 20.3

## 2012-04-20 MED ORDER — PROPOFOL 10 MG/ML IV EMUL
5.0000 ug/kg/min | INTRAVENOUS | Status: DC
  Administered 2012-04-20: 35 ug/kg/min via INTRAVENOUS
  Administered 2012-04-20 (×3): 40 ug/kg/min via INTRAVENOUS
  Administered 2012-04-21: 35 ug/kg/min via INTRAVENOUS
  Administered 2012-04-21: 30 ug/kg/min via INTRAVENOUS
  Administered 2012-04-21 – 2012-04-22 (×4): 35 ug/kg/min via INTRAVENOUS
  Filled 2012-04-20 (×10): qty 100

## 2012-04-20 MED ORDER — AMLODIPINE BESYLATE 5 MG PO TABS
5.0000 mg | ORAL_TABLET | Freq: Every day | ORAL | Status: DC
  Administered 2012-04-20 – 2012-04-21 (×2): 5 mg via ORAL
  Filled 2012-04-20 (×3): qty 1

## 2012-04-20 MED ORDER — HYDRALAZINE HCL 20 MG/ML IJ SOLN
10.0000 mg | INTRAMUSCULAR | Status: DC | PRN
  Administered 2012-05-02 – 2012-05-23 (×2): 10 mg via INTRAVENOUS
  Filled 2012-04-20: qty 0.5
  Filled 2012-04-20: qty 1

## 2012-04-20 MED ORDER — QUETIAPINE FUMARATE 50 MG PO TABS
50.0000 mg | ORAL_TABLET | Freq: Two times a day (BID) | ORAL | Status: DC
  Administered 2012-04-20 – 2012-04-21 (×3): 50 mg via ORAL
  Filled 2012-04-20 (×4): qty 1

## 2012-04-20 NOTE — Progress Notes (Signed)
ANTIBIOTIC CONSULT NOTE - INITIAL  Pharmacy Consult for Vancomycin  Indication: rule out pneumonia  No Known Allergies  Patient Measurements: Height: 5\' 8"  (172.7 cm) Weight: 226 lb 13.7 oz (102.9 kg) IBW/kg (Calculated) : 68.4   Vital Signs: Temp: 101.4 F (38.6 C) (01/13 9604) Temp src: Oral (01/13 0838) BP: 193/119 mmHg (01/13 1000) Pulse Rate: 112  (01/13 1000) Intake/Output from previous day: 01/12 0701 - 01/13 0700 In: 2139.2 [I.V.:1059.2; NG/GT:1080] Out: 4055 [Urine:3455; Stool:600] Intake/Output from this shift: Total I/O In: 278.6 [I.V.:143.6; NG/GT:135] Out: 700 [Urine:700]  Labs:  [facility] 04/20/12 0430 04/19/12 0435 04/18/12 0433  WBC 12.2* 10.5 8.0  HGB 17.5* 16.6 17.0  PLT 149* 149* 158  LABCREA -- -- --  CREATININE 0.68 0.65 0.65   Estimated Creatinine Clearance: 109.9 ml/min (by C-G formula based on Cr of 0.68). No results found for this basename: VANCOTROUGH:2,VANCOPEAK:2,VANCORANDOM:2,GENTTROUGH:2,GENTPEAK:2,GENTRANDOM:2,TOBRATROUGH:2,TOBRAPEAK:2,TOBRARND:2,AMIKACINPEAK:2,AMIKACINTROU:2,AMIKACIN:2, in the last 72 hours   Microbiology: Recent Results (from the past 720 hour(s))  URINE CULTURE     Status: Normal   Collection Time   04/12/12  7:57 PM      Component Value Range Status Comment   Specimen Description URINE, CATHETERIZED   Final    Special Requests NONE   Final    Culture  Setup Time 04/13/2012 02:43   Final    Colony Count NO GROWTH   Final    Culture NO GROWTH   Final    Report Status 04/14/2012 FINAL   Final   CULTURE, BLOOD (ROUTINE X 2)     Status: Normal   Collection Time   04/12/12 10:15 PM      Component Value Range Status Comment   Specimen Description BLOOD RIGHT HAND   Final    Special Requests BOTTLES DRAWN AEROBIC ONLY 3CC   Final    Culture  Setup Time 04/13/2012 05:42   Final    Culture NO GROWTH 5 DAYS   Final    Report Status 04/19/2012 FINAL   Final   CULTURE, BLOOD (ROUTINE X 2)     Status: Normal   Collection  Time   04/12/12 10:26 PM      Component Value Range Status Comment   Specimen Description BLOOD LEFT HAND   Final    Special Requests BOTTLES DRAWN AEROBIC ONLY 3CC   Final    Culture  Setup Time 04/13/2012 05:42   Final    Culture NO GROWTH 5 DAYS   Final    Report Status 04/19/2012 FINAL   Final   MRSA PCR SCREENING     Status: Normal   Collection Time   04/13/12 12:29 AM      Component Value Range Status Comment   MRSA by PCR NEGATIVE  NEGATIVE Final   URINE CULTURE     Status: Normal   Collection Time   04/13/12 12:33 AM      Component Value Range Status Comment   Specimen Description URINE, CATHETERIZED   Final    Special Requests Normal   Final    Culture  Setup Time 04/13/2012 01:56   Final    Colony Count NO GROWTH   Final    Culture NO GROWTH   Final    Report Status 04/14/2012 FINAL   Final   CULTURE, RESPIRATORY     Status: Normal   Collection Time   04/13/12  4:46 AM      Component Value Range Status Comment   Specimen Description TRACHEAL ASPIRATE   Final  Special Requests Normal   Final    Gram Stain     Final    Value: FEW WBC PRESENT, PREDOMINANTLY PMN     NO SQUAMOUS EPITHELIAL CELLS SEEN     RARE GRAM NEGATIVE RODS   Culture     Final    Value: MODERATE HAEMOPHILUS INFLUENZAE     Note: BETA LACTAMASE NEGATIVE   Report Status 04/15/2012 FINAL   Final   LEGIONELLA CULTURE     Status: Normal   Collection Time   04/13/12  5:01 PM      Component Value Range Status Comment   Specimen Description BRONCHIAL WASHINGS   Final    Special Requests NONE   Final    Culture NO LEGIONELLA ISOLATED   Final    Report Status 04/19/2012 FINAL   Final   CULTURE, RESPIRATORY     Status: Normal   Collection Time   04/13/12  5:01 PM      Component Value Range Status Comment   Specimen Description BRONCHIAL WASHINGS   Final    Special Requests NONE   Final    Gram Stain     Final    Value: ABUNDANT WBC PRESENT,BOTH PMN AND MONONUCLEAR     NO SQUAMOUS EPITHELIAL CELLS SEEN     NO  ORGANISMS SEEN   Culture     Final    Value: RARE HAEMOPHILUS INFLUENZAE     Note: BETA LACTAMASE NEGATIVE   Report Status 04/16/2012 FINAL   Final   RESPIRATORY VIRUS PANEL     Status: Abnormal   Collection Time   04/13/12  5:05 PM      Component Value Range Status Comment   Source - RVPAN BRONCHIAL WASHINGS   Final    Respiratory Syncytial Virus A NOT DETECTED   Final    Respiratory Syncytial Virus B NOT DETECTED   Final    Influenza A DETECTED (*)  Final    Influenza B NOT DETECTED   Final    Parainfluenza 1 NOT DETECTED   Final    Parainfluenza 2 NOT DETECTED   Final    Parainfluenza 3 NOT DETECTED   Final    Metapneumovirus DETECTED (*)  Final    Rhinovirus NOT DETECTED   Final    Adenovirus NOT DETECTED   Final    Influenza A H1 NOT DETECTED   Final    Influenza A H3 NOT DETECTED   Final     Medical History: Past Medical History  Diagnosis Date  . Stroke   . Hypertension   . Hyperlipidemia   . GERD (gastroesophageal reflux disease)   . COPD (chronic obstructive pulmonary disease)    Assessment: 62 year old male on D#9 of abx to re-start Vanc for spiking fevers. Currently on ceftriaxone after being treated empirically. WBC back up to 12.2<<10.5.   Zosyn 1/5>>1/10 Azithro 1/5>> 1/8 Tamiflu 1/6 >> Vanc 1/6 >>1/8, 1/13>> Ceftriaxone 1/10>>  1/5 UCx - NEG 1/5 Blood Cx>>negative 1/6 Resp Cx>> H. Infl.  Strep pneumo (-) /Legionella>>neg Flu pcr - NEG 1/6 Resp virus panel>> + for Flu  1/13 BCx>>  Goal of Therapy:  Vancomycin trough level 15-20 mcg/ml  Plan:  Start Vancomycin 1g IV q12h F/U clinical status, renal function, trough at ss   [employer], Pharm.D. Clinical Pharmacist   Pager: [phone] 04/20/2012 12:02 PM

## 2012-04-20 NOTE — Progress Notes (Signed)
NUTRITION FOLLOW UP  Intervention:    With current Propofol, will need to decrease TF rate to prevent overfeeding.    Decrease Oxepa goal rate to 20 ml/h with Prostat 60 ml QID to provide 1520 kcals, 150 gm protein, 377 ml free water daily.  Intake from Propofol and Oxepa will provide a total of 2172 kcals.  Nutrition Dx:   Inadequate oral intake related to inability to eat as evidenced by NPO status, ongoing.  Goal:   Intake to meet >90% of estimated nutrition needs.  Monitor:   TF tolerance, weight trend, labs, vent status  Assessment:   Patient is currently intubated on ventilator support.  MV: 9.7 Temp:Temp (24hrs), Avg:100.9 F (38.3 C), Min:99 F (37.2 C), Max:102.7 F (39.3 C)  Propofol just initiated this afternoon; currently at 24.7 ml/hr providing 652 kcals/day.   Height: Ht Readings from Last 1 Encounters:  04/16/12 5\' 8"  (1.727 m)    Weight Status:   Wt Readings from Last 1 Encounters:  04/20/12 226 lb 13.7 oz (102.9 kg)  1/6 105 kg  BMI=29.7 on admission, therefore, will not permissively underfeed patient.  Re-estimated needs:  Kcal: 2175 Protein: 140 gm Fluid: 2.2 L  Skin: no problems noted  Diet Order: NPO   Intake/Output Summary (Last 24 hours) at 04/20/12 1428 Last data filed at 04/20/12 1301  Gross per 24 hour  Intake 2097.26 ml  Output   3205 ml  Net -1107.74 ml    Last BM: 1/13   Labs:   Lab 04/20/12 0430 04/19/12 0435 04/18/12 0433  NA 145 146* 146*  K 4.3 4.6 4.6  CL 102 101 99  CO2 39* 40* 42*  BUN 36* 33* 37*  CREATININE 0.68 0.65 0.65  CALCIUM 9.6 9.3 9.4  MG -- -- 2.2  PHOS 3.8 3.4 3.2  GLUCOSE 142* 117* 130*    CBG (last 3)   Basename 04/20/12 1213 04/20/12 0748 04/20/12 0430  GLUCAP 208* 152* 134*    Scheduled Meds:   . albuterol-ipratropium  6 puff Inhalation Q4H  . amLODipine  5 mg Oral Daily  . antiseptic oral rinse  15 mL Mouth Rinse QID  . cefTRIAXone (ROCEPHIN) 1 g IVPB  1 g Intravenous Q24H    . chlorhexidine  15 mL Mouth Rinse BID  . feeding supplement  60 mL Per Tube TID  . haloperidol lactate  5 mg Intravenous Q6H  . heparin  5,000 Units Subcutaneous Q8H  . insulin aspart  2-6 Units Subcutaneous Q4H  . methylPREDNISolone sodium succinate  40 mg Intravenous Q12H  . oseltamivir  150 mg Oral BID  . pantoprazole sodium  40 mg Per Tube Daily  . QUEtiapine  50 mg Oral BID  . vancomycin  1,000 mg Intravenous Q12H    Continuous Infusions:   . sodium chloride 250 mL (04/14/12 2251)  . feeding supplement (OXEPA) 1,000 mL (04/19/12 1053)  . fentaNYL infusion INTRAVENOUS 50 mcg/hr (04/19/12 1900)  . propofol 40 mcg/kg/min (04/20/12 1301)    Joaquin Courts, RD, LDN, CNSC Pager# [phone] After Hours Pager# [phone]

## 2012-04-20 NOTE — Procedures (Addendum)
Central Venous Catheter Insertion Procedure Note   Name: Cody [name]  DOB: 01-05-1951    Procedure:  Insertion of Central Venous Catheter  Indications: Assessment of intravascular volume and Drug and/or fluid administration   PROCEDURE DETAILS  Consent:  Risks of procedure as well as the alternatives and risks of each were explained to the (patient/caregiver). Consent for procedure obtained.   Time Out:  Verified patient identification, verified procedure, site/side was marked, verified correct patient position, special equipment/implants available, medications/allergies/relevent history reviewed, required imaging and test results available.   Procedure: Maximum sterile technique was used including antiseptics, cap, gloves, gown, hand hygiene, mask and sheet.  Skin prep: Chlorhexidine; local anesthetic administered  A antimicrobial bonded/coated triple lumen catheter was placed in the right internal jugular vein using the Seldinger technique.  Ultrasound was used for vessel identification and guidance.   EVALUATION:  Blood flow:  good  Complications: No apparent complications  Patient did tolerate procedure well.  Chest X-ray ordered to verify placement. CXR: pending.    Procedure was performed under direct guidance of Anders Simmonds, NP and Dr. Cyril Mourning. Ultrasound used for site verification, live visualisation of needle entry & guidewire prior to dilation   Signed: Johnette Abraham, D.OConsuello Bossier, Internal Medicine Resident Pager: [phone] (7AM-5PM)  Oretha Milch.  04/20/2012, 3:46 PM

## 2012-04-20 NOTE — Progress Notes (Signed)
Recruitment maneuver performed on patient due to desaturation. PC-25 RR-10 Peep-5.0 FI02-100%. Patient tolerated well. Patient continued to have desaturation issues. RT placed on 70% and 10.0 of peep per ARDS protocol. Patient tolerating well. RT will continue to monitor.

## 2012-04-20 NOTE — Progress Notes (Signed)
Pt is on ARDS protocol per Dr Vassie Loll, MD aware pt on 8cc. FIO2 increased to 70% and PEEP to 10 due to O2 sat in the 80's. MD aware.

## 2012-04-20 NOTE — Progress Notes (Signed)
PULMONARY  / CRITICAL CARE MEDICINE  Name: Cody [name] MRN: [id number] DOB: 10-Nov-1950    LOS: 8  REFERRING MD:   EDP - Dr. Preston Fleeting  CHIEF COMPLAINT:  Shortness of breath  BRIEF PATIENT DESCRIPTION: 62 yo M with COPD and 3ppd smoking admitted 04/12/2012 with acute respiratory failure 2/2 influenza A and aspiration PNA, required intubated 04/13/11 for airway protection and aspirated during intubation.  LINES / TUBES: ETT 1/5 >>  L IJ 1/5 >>  R A-line 1/5 >>1/8 pulled out by pt  CULTURES: Blood culture 1/5>> Negative Resp culture 1/6>> H. influenzae  Resp viral panel 1/6>> Influenza A and Metapneumovirus  Sputum Leigonella Culture 1/6>> Negative Bronch Sputum Influenza 1/6>>  Bronch Sputum 1/6>> H. influenzae  Urine 1/5>> Neg  Urine 1/6>> Neg  Flu 1/5>> Neg Blood culture 01/12 >>  ANTIBIOTICS: Zosyn 1/5>>1/9  Azithro 1/5>>1/8  Vanc 1/6>>>1/8 - then restarted 01/13 >>> Tamiflu 1/6>>>  Rocephin 1/9>>   SIGNIFICANT EVENTS:  1/5 Intubated in ED  1/5 Shock , levophed  1/6 ECHO - 55%, mod dil rv, PA pressures>>> not measured b/c no TR jet  1/6 Bronched- diffuse pus all lobes  1/7 increasing pCO2, increased RR on vent  1/8 Changed sedation to Precedex, more agitated than with versed  1/9 Changed sedation back to Versed, agitation greatly improved  1/10 scheduled haldol  1/12 Restarting precedex gtt due to persistent agitation   LEVEL OF CARE:  ICU PRIMARY SERVICE:  PCCM CONSULTANTS:  None CODE STATUS: Full DIET:  Tube feeds DVT Px:  Heparin GI Px:  Protonix   HISTORY OF PRESENT ILLNESS:  Cody [name] is a 62 year old white male with PMH of COPD, 3 pack per day smoking history 20+ years, and HTN presenting to the ED today with his fiance for complaints of worsening shortness of breath x4 days. History obtained by fiance. Cody [name] has baseline shortness of breath especially on exertion, however, for the past 4 days he has been having increased difficulty and has also  developed a worsening productive cough with white sputum and occasionally streaked with bright red blood. His fiance claims today he just could not breat at all and was increasingly confused and extremely light headed when he stands. She denies him to have any headaches, fevers, LOC, N/V/D, chest pain, abdominal pain, or any urinary complaints. In the ED, he became increasingly agitated, confused, and desaturated, was eventually intubated, noted to be hypotensive but responding to fluids (3L bolus), and LIJ placed.    SUBJECTIVE / INTERVAL HISTORY:  Stable overnight with initiation of precedex - although versed ongoing at 1mg  Febrile to 102.6 F overnight --> UA negative, blood cultures sent   VITAL SIGNS: Temp:  [99 F (37.2 C)-102.6 F (39.2 C)] 99.9 F (37.7 C) (01/13 0400) Pulse Rate:  [66-124] 106  (01/13 0600) Resp:  [12-28] 26  (01/13 0600) BP: (124-171)/(71-91) 171/90 mmHg (01/13 0600) SpO2:  [90 %-98 %] 93 % (01/13 0600) FiO2 (%):  [40 %-50 %] 50 % (01/13 0600) Weight:  [226 lb 13.7 oz (102.9 kg)] 226 lb 13.7 oz (102.9 kg) (01/13 0432) HEMODYNAMICS:   VENTILATOR SETTINGS: Vent Mode:  [-] PRVC FiO2 (%):  [40 %-50 %] 50 % Set Rate:  [14 bmp] 14 bmp Vt Set:  [550 mL] 550 mL PEEP:  [5 cmH20] 5 cmH20 Plateau Pressure:  [15 cmH20-19 cmH20] 15 cmH20 INTAKE / OUTPUT: Intake/Output      01/12 0701 - 01/13 0700 01/13 0701 - 01/14 0700  I.V. (mL/kg) 1017.3 (9.9)    NG/GT 1035    IV Piggyback     Total Intake(mL/kg) 2052.3 (19.9)    Urine (mL/kg/hr) 3455 (1.4)    Stool 600    Total Output 4055    Net -2002.8           PHYSICAL EXAMINATION: General: Intubated and sedated, not following commands.  Head: Normocephalic, atraumatic.  Lungs:  Normal respiratory effort. Clear to auscultation BL without crackles or wheezes.  Heart: Sinus rhythm  Abdomen:  BS normoactive. Soft, Nondistended, non-tender.  No masses or organomegaly.  Extremities: 1-2+ pretibial edema.  Skin: No  appreciable rashes, pallor     IMAGING: 1/13 rt hilar fullness & interstitial prominence    DIAGNOSES: Principal Problem:  *Acute respiratory failure with hypercapnia Active Problems:  HYPERLIPIDEMIA  TOBACCO USE  HYPERTENSION  GERD  COPD (chronic obstructive pulmonary disease)  Acute encephalopathy  Acute kidney injury   ASSESSMENT / PLAN:  PULMONARY  Lab 04/20/12 0403 04/18/12 0240 04/17/12 1118  PHART 7.353 7.337* 7.292*  PCO2ART 74.5* 84.2* 90.5*  PO2ART 68.0* 92.9 68.0*  HCO3 41.1* 43.9* 43.7*  TCO2 43 46.5 46   A: 1) Acute hypercapnic respiratory failure/ARDS - secondary to flu, COPD exacerbation. Requiring increased FiO2 /PEEP 1/13.  2) Aspiration - at time of intubation, finished course of treatment  3) Pneumonitis vs. CAP - b/l interstitial opacities on cxr, slightly increased at left, likely atelectasis.  4) Pulmonary edema - worsening per CXR on 1/12 --> Lasix 4) H/o COPD - h/o chronic tobacco abuse hx 3 pack per day 20+ years. On symbicort at home  5) Bloody secretions from aspiration - Pulm /renal syndrome ruled out.   P:  - Continue permissive hypercapnia, reassess if pH less 7.22  - Cont duonebz q4 scheduled and q2 prn  - stopped solumedrol, now on Prednisone 40 daily  -    CARDIOVASCULAR No results found for this basename: CKTOTAL:3,CKMB:3,TROPONINI:3 in the last 168 hours  Lab 04/19/12 1228  PROBNP 398.2*   A:  1) Hypotension- septic shock likely, off pressors. Resolved. Now hypertensive. 2) Elevated proBNP - ProBNP on admission 17911 --> 398 on 1/12. Trop x3 negative. ECHO with normal systolic and diastolic dysfunction, EF 50-55%.  3) HTN -  BPs elevated throughout 1/12 and overnight. On home regimen Lisinopril 40mg , Norvasc 5mg , and HCTZ 25mg  qd  PLAN:  - KVO  - IV Lasix.  - Cont steroids but will need tapering over 2 weeks starting 04/19/12 - Restart home Amlodipine.   RENAL  Lab 04/20/12 0430 04/19/12 0435 04/18/12 0433  NA 145  146* 146*  K 4.3 4.6 4.6  CL 102 101 99  CO2 39* 40* 42*  BUN 36* 33* 37*  CREATININE 0.68 0.65 0.65  GLUCOSE 142* 117* 130*   A:  1) Acute Kidney injury - Resolved. Previously likely pre-renal in setting of volume depletion. Previously urinary sediment clogging foley, improved.  PLAN:  - IVF kvo  - Chem in am    GASTROINTESTINAL No results found for this basename: AST:3,ALT:3,ALKPHOS:3,BILITOT:3,PROT:3,ALBUMIN:3 in the last 168 hours A:  1) GERD 2) TF - still having bowel movements.  P: - TF tolerated  - GI PPx: Protonix   HEMATOLOGIC  Lab 04/20/12 0430 04/19/12 0435 04/18/12 0433  HGB 17.5* 16.6 17.0  HCT 57.5* 55.8* 56.9*  WBC 12.2* 10.5 8.0  PLT 149* 149* 158  APTT -- -- --  INR -- -- --   A:  1) Polycythemia- Hgb stable.  2) Leukocytosis - increasing overnight. He remains on prednisone.   P:  - Am CBC  - Sub q hep. - See ID.  INFECTIOUS No results found for this basename: LATICACIDVEN:3,PROCALCITON:3 in the last 168 hours 1) Flu pneumonia  2) Aspirated at time of intubation, pneumonitis vs PNA?  3) Fever - patient with fever to 102.6 overnight, and worsening leukocytosis. Cause unclear. UA negative, pending AM CXR. Pending repeat Blood cultures. Has had central line for 8 days raising question of line infection.  P:  - Continue tamiflu - day 8/10  - Continue Rocephin day 7/8 - Continue to monitor for worsening, in which case will broaden antibiotics. - F/U repeat CXR. - F/U blood cultures. - Will dc current central line and replace. - Will get culture from line tip. - Start Vanocmycin.   ENDOCRINE  Lab 04/20/12 0430 04/19/12 2358 04/19/12 1954 04/19/12 1547 04/19/12 1145  GLUCAP 134* 199* 142* 168* 197*   A: 1) On steroids - CBGs stable. P:  - Continue SSI  - CBG monitoring BID  - Continue prednisone - day 5  - Cont ICU Hyperglycemic protocol    NEUROLOGIC / PSYCHIATRIC RAAS Score: 1  CAM-ICU: Unable to obtain A:  1) Acute  encephalopathy - Likely secondary to hypoxia / hypercarbia and possible infectious process (CAP?).  2) Delerium - sedated on mechanical ventilation, remains off pressors, off precedex, well tolerating versed / fentanyl with minimal agitation - hope that with haldol on board, will better tolerate the precedex.  P:  - Daily WUA  - Haldol q6h scheduled (since 01/09) - checking EKG for prolonged QT. - Will start propofol. - Start seroquel. - Wean precedex to off. - Wean versed to off. - Chair position   CLINICAL SUMMARY: Mr. Stipp is a 62 year old male with hx of COPD presenting flu PNA< ARDS. Permissive hypercapnia, repeat abg, off pressors, kvo, agitation improved on versed drip and precedex. New fevers, cause not clear, possible line infection, will empirically cover with vancomycin, replace, and culture the line.   Signed: Johnette Abraham, Roma Schanz, Internal Medicine Resident Pager: [phone] (7PM-7AM) 04/20/2012, 7:29 AM     The patient's case and plan of care was discussed with attending physician, Dr. Cyril Mourning.  I have personally obtained a history, examined the patient, evaluated laboratory and imaging results, formulated the assessment and plan and placed orders.  CRITICAL CARE: The patient is critically ill with multiple organ systems failure and requires high complexity decision making for assessment and support, frequent evaluation and titration of therapies, application of advanced monitoring technologies and extensive interpretation of multiple databases. Critical Care Time devoted to patient care services described in this note is 35 minutes.    ALVA,RAKESH V.

## 2012-04-21 LAB — CBC WITH DIFFERENTIAL/PLATELET
Eosinophils Relative: 0 % (ref 0–5)
HCT: 55.6 % — ABNORMAL HIGH (ref 39.0–52.0)
Hemoglobin: 16.9 g/dL (ref 13.0–17.0)
Lymphocytes Relative: 8 % — ABNORMAL LOW (ref 12–46)
Lymphs Abs: 0.9 10*3/uL (ref 0.7–4.0)
MCV: 99.1 fL (ref 78.0–100.0)
Monocytes Absolute: 0.9 10*3/uL (ref 0.1–1.0)
Monocytes Relative: 8 % (ref 3–12)
Neutro Abs: 9.6 10*3/uL — ABNORMAL HIGH (ref 1.7–7.7)
WBC: 11.4 10*3/uL — ABNORMAL HIGH (ref 4.0–10.5)

## 2012-04-21 LAB — GLUCOSE, CAPILLARY
Glucose-Capillary: 184 mg/dL — ABNORMAL HIGH (ref 70–99)
Glucose-Capillary: 145 mg/dL — ABNORMAL HIGH (ref 70–99)
Glucose-Capillary: 226 mg/dL — ABNORMAL HIGH (ref 70–99)
Glucose-Capillary: 137 mg/dL — ABNORMAL HIGH (ref 70–99)

## 2012-04-21 LAB — BASIC METABOLIC PANEL
BUN: 46 mg/dL — ABNORMAL HIGH (ref 6–23)
CO2: 39 mEq/L — ABNORMAL HIGH (ref 19–32)
Calcium: 9.5 mg/dL (ref 8.4–10.5)
Chloride: 102 mEq/L (ref 96–112)
Creatinine, Ser: 0.64 mg/dL (ref 0.50–1.35)
Glucose, Bld: 164 mg/dL — ABNORMAL HIGH (ref 70–99)

## 2012-04-21 LAB — PHOSPHORUS: Phosphorus: 3.8 mg/dL (ref 2.3–4.6)

## 2012-04-21 MED ORDER — INSULIN GLARGINE 100 UNIT/ML ~~LOC~~ SOLN
7.0000 [IU] | Freq: Every day | SUBCUTANEOUS | Status: DC
  Administered 2012-04-21 – 2012-04-22 (×2): 7 [IU] via SUBCUTANEOUS

## 2012-04-21 MED ORDER — FUROSEMIDE 10 MG/ML IJ SOLN
20.0000 mg | Freq: Two times a day (BID) | INTRAMUSCULAR | Status: DC
  Administered 2012-04-21 – 2012-04-22 (×4): 20 mg via INTRAVENOUS
  Filled 2012-04-21 (×5): qty 2

## 2012-04-21 MED ORDER — QUETIAPINE FUMARATE 100 MG PO TABS
100.0000 mg | ORAL_TABLET | Freq: Two times a day (BID) | ORAL | Status: DC
  Administered 2012-04-21 – 2012-04-22 (×2): 100 mg via ORAL
  Filled 2012-04-21 (×3): qty 1

## 2012-04-21 NOTE — Progress Notes (Signed)
PULMONARY  / CRITICAL CARE MEDICINE  Name: Cody [name] MRN: [id number] DOB: 1950/07/18    LOS: 9  REFERRING MD:   EDP - Dr. Preston Fleeting  CHIEF COMPLAINT:  Shortness of breath  BRIEF PATIENT DESCRIPTION: 62 yo M with COPD and 3ppd smoking admitted 04/12/2012 with acute respiratory failure 2/2 influenza A and aspiration PNA, required intubated 04/13/11 for airway protection and aspirated during intubation.  LINES / TUBES: ETT 1/5 >>  L IJ 1/5 >> 1/13 R IJ 1/13 >>  R A-line 1/5 >>1/8 pulled out by pt  CULTURES: Blood culture 1/5>> Negative Resp culture 1/6>> H. influenzae  Resp viral panel 1/6>> Influenza A and Metapneumovirus  Sputum Leigonella Culture 1/6>> Negative Bronch Sputum Influenza 1/6>>  Bronch Sputum 1/6>> H. influenzae  Urine 1/5>> Neg  Urine 1/6>> Neg  Flu 1/5>> Neg Blood culture 01/12 >> Catheter tip culture 1/13 >>  ANTIBIOTICS: Zosyn 1/5>>1/9  Azithro 1/5>>1/8  Vanc 1/6>>>1/8 - then restarted 01/13 >>> Tamiflu 1/6>>>  Rocephin 1/9>>   SIGNIFICANT EVENTS:  1/5 Intubated in ED  1/5 Shock , levophed  1/6 ECHO - 55%, mod dil rv, PA pressures>>> not measured b/c no TR jet  1/6 Bronched- diffuse pus all lobes  1/7 increasing pCO2, increased RR on vent  1/8 Changed sedation to Precedex, more agitated than with versed  1/9 Changed sedation back to Versed, agitation greatly improved  1/10 scheduled haldol  1/12 Restarting precedex gtt due to persistent agitation 1/13 Febrile to 102.6 overnight. ? Catheter infection? DC'ed L IJ, placed new R IJ. 1/14 No fevers overnight, agitation controlled on propofol (added 1/13), more interactive.   LEVEL OF CARE:  ICU PRIMARY SERVICE:  PCCM CONSULTANTS:  None CODE STATUS: Full DIET:  Tube feeds DVT Px:  Heparin GI Px:  Protonix   HISTORY OF PRESENT ILLNESS:  Mr. Cody [name] is a 62 year old white male with PMH of COPD, 3 pack per day smoking history 20+ years, and HTN presenting to the ED today with his fiance for  complaints of worsening shortness of breath x4 days. History obtained by fiance. Mr. Cody [name] has baseline shortness of breath especially on exertion, however, for the past 4 days he has been having increased difficulty and has also developed a worsening productive cough with white sputum and occasionally streaked with bright red blood. His fiance claims today he just could not breat at all and was increasingly confused and extremely light headed when he stands. She denies him to have any headaches, fevers, LOC, N/V/D, chest pain, abdominal pain, or any urinary complaints. In the ED, he became increasingly agitated, confused, and desaturated, was eventually intubated, noted to be hypotensive but responding to fluids (3L bolus), and LIJ placed.    SUBJECTIVE / INTERVAL HISTORY:  Patient is interactive this morning, is able to nod to answer questions. Indicates no pain, nausea, vomiting, shortness of breath, chest pain.  Interval Events: - Patient was changed to propofol, seroquel on 1/13 has tolerated well with control of his agitation and is more interactive. - Left IJ was removed 1/13 with concern for possible catheter related infection (was replaced with Rt IJ).   VITAL SIGNS: Temp:  [98.3 F (36.8 C)-102.7 F (39.3 C)] 98.3 F (36.8 C) (01/14 0419) Pulse Rate:  [70-118] 85  (01/14 0400) Resp:  [17-27] 17  (01/14 0400) BP: (101-193)/(57-119) 141/87 mmHg (01/14 0400) SpO2:  [92 %-98 %] 95 % (01/14 0400) FiO2 (%):  [50 %-70 %] 70 % (01/14 0400) Weight:  [782  lb 3.1 oz (102.6 kg)] 226 lb 3.1 oz (102.6 kg) (01/14 0453) HEMODYNAMICS:   VENTILATOR SETTINGS: Vent Mode:  [-] PRVC FiO2 (%):  [50 %-70 %] 70 % Set Rate:  [14 bmp] 14 bmp Vt Set:  [550 mL] 550 mL PEEP:  [5 cmH20-10 cmH20] 10 cmH20 Plateau Pressure:  [16 cmH20-18 cmH20] 18 cmH20 INTAKE / OUTPUT: Intake/Output      01/13 0701 - 01/14 0700   I.V. (mL/kg) 891.1 (8.7)   NG/GT 740   IV Piggyback 460   Total Intake(mL/kg) 2091.1  (20.4)   Urine (mL/kg/hr) 2320 (0.9)   Stool 100   Total Output 2420   Net -328.9         PHYSICAL EXAMINATION: General: Intubated, awakens to voice, able to follow commands and nod to questions. Most interactive thus far.  Head: Normocephalic, atraumatic.  Lungs:  Normal respiratory effort. Basilar crackles. No wheezing.Marland Kitchen  Heart: Sinus rhythm  Abdomen:  BS normoactive. Soft, Nondistended, non-tender.  No masses or organomegaly.  Extremities: 1 pretibial edema improved.  Skin: No appreciable rashes, pallor     IMAGING: 1/13 - CXR - 1. Right IJ line tip: SVC. No pneumothorax. Otherwise essentially stable.    DIAGNOSES: Principal Problem:  *Acute respiratory failure with hypercapnia Active Problems:  HYPERLIPIDEMIA  TOBACCO USE  HYPERTENSION  GERD  COPD (chronic obstructive pulmonary disease)  Acute encephalopathy  Acute kidney injury   ASSESSMENT / PLAN:  PULMONARY  Lab 04/20/12 0403 04/18/12 0240 04/17/12 1118  PHART 7.353 7.337* 7.292*  PCO2ART 74.5* 84.2* 90.5*  PO2ART 68.0* 92.9 68.0*  HCO3 41.1* 43.9* 43.7*  TCO2 43 46.5 46   A: 1) Acute hypercapnic respiratory failure/ARDS - secondary to flu, COPD exacerbation. Requiring increased FiO2 /PEEP 1/13 0 likely contributed by agitation. 2) Aspiration - at time of intubation, finished course of treatment  3) Pneumonitis vs. CAP - b/l interstitial opacities on cxr, slightly increased at left, likely atelectasis.  4) Pulmonary edema - worsening per CXR on 1/12 --> Lasix. 4) Non-oxygen dependent COPD - h/o chronic tobacco abuse hx 3 pack per day 20+ years. On symbicort at home. Not on home O2 therapy. 5) Bloody secretions from aspiration - Pulm /renal syndrome ruled out.   P:  - Continue permissive hypercapnia, reassess if pH less 7.22  - Cont duonebs q4 scheduled and q2 prn.  - Continue IV steroids. - O2 sat goal > 88%. - Remains on PEEP of 10 --> will slowly titrate down if tolerated. - Smoking cessation  education will be needed.   CARDIOVASCULAR No results found for this basename: CKTOTAL:3,CKMB:3,TROPONINI:3 in the last 168 hours  Lab 04/19/12 1228  PROBNP 398.2*   A:  1) Hypotension - Resolved. Initially contributed by septic shock likely, previously requiring pressors. Resolved. Now hypertensive. 2) Elevated proBNP - ProBNP on admission 17911 --> 398 on 1/12. Trop x3 negative. ECHO with normal systolic and diastolic dysfunction, EF 50-55%.  3) HTN -  BPs elevated throughout 1/12 and overnight. On home regimen Lisinopril 40mg , Norvasc 5mg , and HCTZ 25mg  qd  PLAN:  - KVO  - IV Lasix - 20mg  BID.  - Continue steroids. - Continue amlodipine.   RENAL  Lab 04/20/12 0430 04/19/12 0435 04/18/12 0433  NA 145 146* 146*  K 4.3 4.6 4.6  CL 102 101 99  CO2 39* 40* 42*  BUN 36* 33* 37*  CREATININE 0.68 0.65 0.65  GLUCOSE 142* 117* 130*   A:  1) Acute Kidney injury - Resolved. Previously likely  pre-renal in setting of volume depletion. Previously urinary sediment clogging foley, improved.  PLAN:  - IVF kvo  - Chem in am    GASTROINTESTINAL No results found for this basename: AST:3,ALT:3,ALKPHOS:3,BILITOT:3,PROT:3,ALBUMIN:3 in the last 168 hours A:  1) GERD 2) TF - still having bowel movements.  P: - TF tolerated  - GI PPx: Protonix   HEMATOLOGIC  Lab 04/21/12 0500 04/20/12 0430 04/19/12 0435  HGB 16.9 17.5* 16.6  HCT 55.6* 57.5* 55.8*  WBC 11.4* 12.2* 10.5  PLT 153 149* 149*  APTT -- -- --  INR -- -- --   A:  1) Polycythemia- Hgb stable.  2) Leukocytosis - decreasing overnight after addition of Vanc and DC of left IJ on 1/13 (?catheter infection). He remains on prednisone.   P:  - Am CBC  - Sub q hep. - See ID.  INFECTIOUS No results found for this basename: LATICACIDVEN:3,PROCALCITON:3 in the last 168 hours 1) Flu pneumonia  2) Aspirated at time of intubation, pneumonitis vs PNA?  3) ? Nosocomial infection - possible catheter infection - Left IJ (in 8 days)  was dc'ed on 1/13 and catheter tip cultures. Was resumed Vanc on 1/13 in this setting as UA and CXR otherwise not indicating of infection.  P:  - Continue tamiflu - day 8/10  - Continue Rocephin day 8/8 - Continue Vancomycin for possible catheter infection - likely will treat for 5-7 days (narrow depending on catheter culture results. BUT may require longer treatment duration if bacteremia evidenced. - F/U blood cultures and culture from line tip.   ENDOCRINE  Lab 04/21/12 0418 04/20/12 2357 04/20/12 2011 04/20/12 1619 04/20/12 1213  GLUCAP 184* 205* 193* 185* 208*   A: 1) On steroids - CBGs mildly elevated above goal likely as changed to IV steroids. P:  - Cont ICU Hyperglycemic protocol. - Escalate to lantus 7 units. - CBG monitoring BID  - Continue IV steroids.   NEUROLOGIC / PSYCHIATRIC RAAS Score: -2  CAM-ICU: Unable to obtain A:  1) Acute encephalopathy - Likely secondary to hypoxia / hypercarbia and possible infectious process (CAP?).  2) Delerium - sedated on mechanical ventilation, remains off pressors. Has been the limiting factor because requiring high amnts sedation. However, will change of regimen to Propofol + seroquel + haldol, is better controlled. No longer requiring precedex or versed.  P:  - Daily WUA  - DC Haldol. - Increase seroquel to 100mg  BID.  - Continue propofol, wean down as tolerated.  - Chair position   CLINICAL SUMMARY: Mr. Hannen is a 62 year old male with hx of COPD presenting flu PNA< ARDS. Permissive hypercapnia, repeat abg, off pressors, kvo, agitation improved on versed drip and precedex. New fevers, cause not clear, possible line infection, will empirically cover with vancomycin, replace, and culture the line.   Signed: Johnette Abraham, Roma Schanz, Internal Medicine Resident Pager: [phone] (7PM-7AM) Care during the described time interval was provided by me and/or other providers on the critical care team.  I have reviewed this  patient's available data, including medical history, events of note, physical examination and test results as part of my evaluation  CC time x  35 m ALVA,RAKESH V.  04/21/2012, 5:34 AM

## 2012-04-22 ENCOUNTER — Inpatient Hospital Stay (HOSPITAL_COMMUNITY): Payer: Medicaid Other

## 2012-04-22 LAB — GLUCOSE, CAPILLARY: Glucose-Capillary: 98 mg/dL (ref 70–99)

## 2012-04-22 LAB — COMPREHENSIVE METABOLIC PANEL
ALT: 21 U/L (ref 0–53)
AST: 19 U/L (ref 0–37)
CO2: 39 mEq/L — ABNORMAL HIGH (ref 19–32)
Chloride: 102 mEq/L (ref 96–112)
Creatinine, Ser: 0.6 mg/dL (ref 0.50–1.35)
GFR calc non Af Amer: >90 mL/min (ref >90)
Sodium: 146 mEq/L — ABNORMAL HIGH (ref 135–145)
Total Bilirubin: 0.4 mg/dL (ref 0.3–1.2)

## 2012-04-22 LAB — POCT I-STAT 3, ART BLOOD GAS (G3+)
O2 Saturation: 87 %
Patient temperature: 99
TCO2: 45 mmol/L (ref 0–100)
pH, Arterial: 7.43 (ref 7.350–7.450)

## 2012-04-22 LAB — CBC WITH DIFFERENTIAL/PLATELET
Basophils Absolute: 0 10*3/uL (ref 0.0–0.1)
Basophils Relative: 0 % (ref 0–1)
HCT: 55 % — ABNORMAL HIGH (ref 39.0–52.0)
MCHC: 30.7 g/dL (ref 30.0–36.0)
Monocytes Absolute: 1.3 10*3/uL — ABNORMAL HIGH (ref 0.1–1.0)
Neutro Abs: 9.6 10*3/uL — ABNORMAL HIGH (ref 1.7–7.7)
Platelets: 152 10*3/uL (ref 150–400)
RDW: 17.5 % — ABNORMAL HIGH (ref 11.5–15.5)

## 2012-04-22 LAB — PHOSPHORUS: Phosphorus: 3.2 mg/dL (ref 2.3–4.6)

## 2012-04-22 MED ORDER — ALBUTEROL SULFATE (5 MG/ML) 0.5% IN NEBU
2.5000 mg | INHALATION_SOLUTION | Freq: Four times a day (QID) | RESPIRATORY_TRACT | Status: DC
  Administered 2012-04-22 – 2012-04-23 (×3): 2.5 mg via RESPIRATORY_TRACT
  Filled 2012-04-22 (×3): qty 0.5

## 2012-04-22 MED ORDER — INSULIN GLARGINE 100 UNIT/ML ~~LOC~~ SOLN
5.0000 [IU] | Freq: Every day | SUBCUTANEOUS | Status: DC
  Administered 2012-04-23: 5 [IU] via SUBCUTANEOUS

## 2012-04-22 MED ORDER — HALOPERIDOL LACTATE 5 MG/ML IJ SOLN
5.0000 mg | Freq: Four times a day (QID) | INTRAMUSCULAR | Status: DC | PRN
  Administered 2012-04-24 – 2012-04-25 (×2): 5 mg via INTRAVENOUS
  Filled 2012-04-22 (×3): qty 1

## 2012-04-22 MED ORDER — METOPROLOL TARTRATE 1 MG/ML IV SOLN
INTRAVENOUS | Status: AC
  Filled 2012-04-22: qty 5

## 2012-04-22 MED ORDER — IPRATROPIUM BROMIDE 0.02 % IN SOLN
0.5000 mg | Freq: Four times a day (QID) | RESPIRATORY_TRACT | Status: DC
  Administered 2012-04-22 – 2012-05-27 (×136): 0.5 mg via RESPIRATORY_TRACT
  Filled 2012-04-22 (×133): qty 2.5

## 2012-04-22 MED ORDER — AMLODIPINE BESYLATE 10 MG PO TABS
10.0000 mg | ORAL_TABLET | Freq: Every day | ORAL | Status: DC
  Administered 2012-04-22: 10 mg via ORAL
  Filled 2012-04-22: qty 1

## 2012-04-22 MED ORDER — METOPROLOL TARTRATE 1 MG/ML IV SOLN
5.0000 mg | Freq: Once | INTRAVENOUS | Status: AC
  Administered 2012-04-22: 5 mg via INTRAVENOUS

## 2012-04-22 MED ORDER — ACETAMINOPHEN 650 MG RE SUPP
325.0000 mg | Freq: Four times a day (QID) | RECTAL | Status: DC | PRN
  Filled 2012-04-22: qty 2

## 2012-04-22 MED ORDER — PHENOL 1.4 % MT LIQD
1.0000 | OROMUCOSAL | Status: DC | PRN
  Administered 2012-04-22: 1 via OROMUCOSAL
  Filled 2012-04-22: qty 177

## 2012-04-22 NOTE — Procedures (Signed)
Extubation Procedure Note  Patient Details:   Name: Cody [name] DOB: 12-Apr-1950 MRN: [id number]   Airway Documentation:  Pt weaned on 40% 5/5. Tol well. Patient then extubated and placed on 40% venturimask. sats 88-90%   Evaluation  O2 sats: stable throughout Complications: No apparent complications Patient did tolerate procedure well. Bilateral Breath Sounds: Diminished;Rhonchi Suctioning: Airway Yes  Clearance Coots 04/22/2012, 1:17 PM

## 2012-04-22 NOTE — Progress Notes (Signed)
eLink Physician-Brief Progress Note Patient Name: Cody [name] DOB: Sep 16, 1950 MRN: [id number]  Date of Service  04/22/2012   HPI/Events of Note   A-fib / RVR  eICU Interventions   Metoprolol 5 IV x1   Intervention Category Major Interventions: Arrhythmia - evaluation and management  Wei Poplaski 04/22/2012, 10:49 PM

## 2012-04-22 NOTE — Progress Notes (Signed)
PULMONARY  / CRITICAL CARE MEDICINE  Name: Cody [name] MRN: [id number] DOB: 1950/09/20    LOS: 10  REFERRING MD:   EDP - Dr. Preston Fleeting  CHIEF COMPLAINT:  Shortness of breath  BRIEF PATIENT DESCRIPTION: 62 yo M with COPD and 3ppd smoking admitted 04/12/2012 with acute respiratory failure 2/2 influenza A and aspiration PNA, required intubated 04/13/11 for airway protection and aspirated during intubation.  LINES / TUBES: ETT 1/5 >>  L IJ 1/5 >> 1/13 R IJ 1/13 >>  R A-line 1/5 >>1/8 pulled out by pt  CULTURES: Blood culture 1/5>> Negative Resp culture 1/6>> H. influenzae  Resp viral panel 1/6>> Influenza A and Metapneumovirus  Sputum Leigonella Culture 1/6>> Negative Bronch Sputum Influenza 1/6>>  Bronch Sputum 1/6>> H. influenzae  Urine 1/5>> Neg  Urine 1/6>> Neg  Flu 1/5>> Neg Blood culture 01/13 >> Catheter tip culture 1/13 >> C.diff PCR 1/14 >>  ANTIBIOTICS: Zosyn 1/5>>1/9  Azithro 1/5>>1/8  Vanc 1/6>>>1/8 - then restarted 01/13 >>> Tamiflu 1/6>>>  Rocephin 1/9>>1/15   SIGNIFICANT EVENTS:  1/5 Intubated in ED  1/5 Shock , levophed  1/6 ECHO - 55%, mod dil rv, PA pressures>>> not measured b/c no TR jet  1/6 Bronched- diffuse pus all lobes  1/7 increasing pCO2, increased RR on vent  1/8 Changed sedation to Precedex, more agitated than with versed  1/9 Changed sedation back to Versed, agitation greatly improved  1/10 scheduled haldol  1/12 Restarting precedex gtt due to persistent agitation 1/13 Febrile to 102.6 overnight. ? Catheter infection? DC'ed L IJ, placed new R IJ. 1/14 No fevers overnight, agitation controlled on propofol (added 1/13), more interactive. 1/15 Had foul smelling stools overnight, therefore, C.diff PCR was sent by RN.   LEVEL OF CARE:  ICU PRIMARY SERVICE:  PCCM CONSULTANTS:  None CODE STATUS: Full DIET:  Tube feeds DVT Px:  Heparin GI Px:  Protonix    Interval Events: - Haldol dc'ed 1/14, increased seroquel - No fevers overnight -  Remains on propofol.   VITAL SIGNS: Temp:  [97.9 F (36.6 C)-98.9 F (37.2 C)] 98.7 F (37.1 C) (01/15 0403) Pulse Rate:  [80-116] 85  (01/15 0500) Resp:  [16-25] 21  (01/15 0500) BP: (121-173)/(74-112) 129/85 mmHg (01/15 0500) SpO2:  [86 %-97 %] 94 % (01/15 0500) FiO2 (%):  [40 %-60 %] 50 % (01/15 0500) Weight:  [218 lb 7.6 oz (99.1 kg)] 218 lb 7.6 oz (99.1 kg) (01/15 0439) HEMODYNAMICS:   VENTILATOR SETTINGS: Vent Mode:  [-] PRVC FiO2 (%):  [40 %-60 %] 50 % Set Rate:  [14 bmp] 14 bmp Vt Set:  [550 mL] 550 mL PEEP:  [5 cmH20-10 cmH20] 5 cmH20 Plateau Pressure:  [13 cmH20-18 cmH20] 18 cmH20 INTAKE / OUTPUT: Intake/Output      01/14 0701 - 01/15 0700   I.V. (mL/kg) 908.9 (9.2)   NG/GT 1290   IV Piggyback 464   Total Intake(mL/kg) 2662.9 (26.9)   Urine (mL/kg/hr) 4050 (1.7)   Stool 200   Total Output 4250   Net -1587.2         PHYSICAL EXAMINATION: General: Intubated, sedate. Does not appear to be in acute distress.  Head: Normocephalic, atraumatic.  Lungs:  Normal respiratory effort. Basilar crackles. No wheezing.Marland Kitchen  Heart: Sinus rhythm  Abdomen:  BS normoactive. Soft, Nondistended, non-tender.  No masses or organomegaly.  Extremities: No pretibial edema.  Skin: No appreciable rashes, pallor     IMAGING: 1/13 - CXR - 1. Right IJ line tip: SVC. No  pneumothorax. Otherwise essentially stable. 1/15 - CXR - pending.   DIAGNOSES: Principal Problem:  *Acute respiratory failure with hypercapnia Active Problems:  HYPERLIPIDEMIA  TOBACCO USE  HYPERTENSION  GERD  COPD (chronic obstructive pulmonary disease)  Acute encephalopathy  Acute kidney injury   ASSESSMENT / PLAN:  PULMONARY  Lab 04/20/12 0403 04/18/12 0240 04/17/12 1118  PHART 7.353 7.337* 7.292*  PCO2ART 74.5* 84.2* 90.5*  PO2ART 68.0* 92.9 68.0*  HCO3 41.1* 43.9* 43.7*  TCO2 43 46.5 46   A: 1) Acute hypercapnic respiratory failure/ARDS - secondary to flu, COPD exacerbation. Requiring increased  FiO2 /PEEP 1/13 likely contributed by agitation. 2) Aspiration - at time of intubation, finished course of treatment  3) Pneumonitis vs. CAP - b/l interstitial opacities on cxr, slightly increased at left, likely atelectasis. Completed 8/8 days rocephin  4) Pulmonary edema - worsening per CXR on 1/12 --> Lasix. 4) Non-oxygen dependent COPD - h/o chronic tobacco abuse hx 3 pack per day 20+ years. On symbicort at home. Not on home O2 therapy. 5) Bloody secretions from aspiration - Pulm /renal syndrome ruled out.   P:  - Continue permissive hypercapnia, reassess if pH less 7.22  - Cont duonebs q4 scheduled and q2 prn.  - Continue IV steroids - O2 sat goal > 88%. - Smoking cessation education will be needed.   CARDIOVASCULAR No results found for this basename: CKTOTAL:3,CKMB:3,TROPONINI:3 in the last 168 hours  Lab 04/19/12 1228  PROBNP 398.2*   A:  1) Hypotension - Resolved. Initially contributed by septic shock likely, previously requiring pressors. Resolved. Now hypertensive. 2) Elevated proBNP - ProBNP on admission 17911 --> 398 on 1/12. Trop x3 negative. ECHO with normal systolic and diastolic dysfunction, EF 50-55%.  3) HTN -  BPs remain elevated. On home regimen Lisinopril 40mg , Norvasc 5mg , and HCTZ 25mg  qd  PLAN:  - KVO  - IV Lasix - 20mg  BID.  - Continue steroids. - Increase amlodipine to 10mg    RENAL  Lab 04/22/12 0358 04/21/12 0500 04/20/12 0430  NA 146* 147* 145  K 3.7 4.5 4.3  CL 102 102 102  CO2 39* 39* 39*  BUN 46* 46* 36*  CREATININE 0.60 0.64 0.68  GLUCOSE 183* 164* 142*   A:  1) Acute Kidney injury - Resolved. Previously likely pre-renal in setting of volume depletion. Previously urinary sediment clogging foley, improved.  2) Hypernatremia - improving.  P:  - IVF KVO - Continue to monitor renal function.   GASTROINTESTINAL  Lab 04/22/12 0358  AST 19  ALT 21  ALKPHOS 59  BILITOT 0.4  PROT 6.1  ALBUMIN 2.4*   A:  1) GERD 2) TF - still  having bowel movements. 3) Diarrhea - stool sent for C. Diff - pending.  P: - TF tolerated  - GI PPx: Protonix  - Add enteric precautions until C.diff PCR resulted.  HEMATOLOGIC  Lab 04/22/12 0358 04/21/12 0500 04/20/12 0430  WBC 11.8* 11.4* 12.2*  HGB 16.9 16.9 17.5*  HCT 55.0* 55.6* 57.5*  PLT 152 153 149*  APTT -- -- --  INR -- -- --    A:  1) Polycythemia- Hgb stable.  2) Leukocytosis - slight increase since yesterday, although remains afebrile. Vanc and DC of left IJ on 1/13 ( for presumed L IJ catheter infection). He remains on steroids.   P:  - Am CBC  - Sub q hep. - See ID.  INFECTIOUS No results found for this basename: LATICACIDVEN:3,PROCALCITON:3 in the last 168 hours 1) Flu pneumonia -  Flu A and metapneumovirus  2) Aspirated at time of intubation, pneumonitis vs PNA 3) Nosocomial infection - likely catheter infection - Left IJ (in 8 days) was dc'ed on 1/13 and catheter tip cultures. Was resumed Vanc on 1/13 in this setting as UA and CXR otherwise not indicating of infection. 4) Diarrhea - checking for C.diff.  P:  - Continue tamiflu - day 9/10  - Completed 8 day course of Rocephin. - Continue Vancomycin for possible catheter infection - day 3. Will likely treat for 5-7 days (narrow depending on catheter culture results. BUT may require longer treatment duration if bacteremia evidenced.) - F/U blood cultures and culture from line tip. - F/u C.diff PCR.   ENDOCRINE  Lab 04/22/12 0401 04/22/12 0035 04/21/12 1943 04/21/12 1648 04/21/12 1232  GLUCAP 187* 157* 137* 155* 226*   A: 1) On steroids - CBGs at goal. Lantus increased on 1/14. P:  - Cont ICU Hyperglycemic protocol. - Continue lantus 7 units. - CBG monitoring BID  - Continue IV steroids.   NEUROLOGIC / PSYCHIATRIC RAAS Score: -2  CAM-ICU: neg A:  1) Acute encephalopathy - Likely secondary to hypoxia / hypercarbia and possible infectious process (CAP?). Resolving. 2) Delerium - Improving with  Propofol + seroquel. No longer requiring precedex or versed.  P:  - Daily WUA  - dc  seroquel & propofol once extubated - Chair position   CLINICAL SUMMARY: heavy smoker with COPD presenting flu PNA< ARDS., aspiration. Now extubated with some delerium persisting   Signed: Johnette Abraham, D.OConsuello Bossier, Internal Medicine Resident Pager: [phone] (7PM-7AM)  Care during the described time interval was provided by me and/or other providers on the critical care team.  I have reviewed this patient's available data, including medical history, events of note, physical examination and test results as part of my evaluation  CC time x  40 m  Cierria Height V.

## 2012-04-22 NOTE — Progress Notes (Signed)
New orders received. Patient extubated this pm. Will f/u 1/16.   Ferdinand Lango MA, CCC-SLP [phone]

## 2012-04-23 ENCOUNTER — Inpatient Hospital Stay (HOSPITAL_COMMUNITY): Payer: Medicaid Other

## 2012-04-23 ENCOUNTER — Encounter (HOSPITAL_COMMUNITY): Payer: Self-pay | Admitting: Cardiology

## 2012-04-23 LAB — CBC WITH DIFFERENTIAL/PLATELET
Eosinophils Absolute: 0 10*3/uL (ref 0.0–0.7)
Eosinophils Relative: 0 % (ref 0–5)
HCT: 59.7 % — ABNORMAL HIGH (ref 39.0–52.0)
Hemoglobin: 19.1 g/dL — ABNORMAL HIGH (ref 13.0–17.0)
Lymphs Abs: 0.9 10*3/uL (ref 0.7–4.0)
MCH: 31.5 pg (ref 26.0–34.0)
MCV: 98.4 fL (ref 78.0–100.0)
Monocytes Absolute: 1.3 10*3/uL — ABNORMAL HIGH (ref 0.1–1.0)
Monocytes Relative: 10 % (ref 3–12)
RBC: 6.07 MIL/uL — ABNORMAL HIGH (ref 4.22–5.81)

## 2012-04-23 LAB — COMPREHENSIVE METABOLIC PANEL
BUN: 43 mg/dL — ABNORMAL HIGH (ref 6–23)
CO2: 39 mEq/L — ABNORMAL HIGH (ref 19–32)
Chloride: 106 mEq/L (ref 96–112)
Creatinine, Ser: 0.58 mg/dL (ref 0.50–1.35)
GFR calc non Af Amer: >90 mL/min (ref >90)
Glucose, Bld: 136 mg/dL — ABNORMAL HIGH (ref 70–99)
Total Bilirubin: 0.9 mg/dL (ref 0.3–1.2)

## 2012-04-23 LAB — TROPONIN I: Troponin I: <0.3 ng/mL (ref <0.30)

## 2012-04-23 LAB — MAGNESIUM: Magnesium: 2.3 mg/dL (ref 1.5–2.5)

## 2012-04-23 LAB — GLUCOSE, CAPILLARY
Glucose-Capillary: 146 mg/dL — ABNORMAL HIGH (ref 70–99)
Glucose-Capillary: 153 mg/dL — ABNORMAL HIGH (ref 70–99)

## 2012-04-23 LAB — PROTIME-INR: Prothrombin Time: 12.1 seconds (ref 11.6–15.2)

## 2012-04-23 MED ORDER — CHLORHEXIDINE GLUCONATE 0.12 % MT SOLN
15.0000 mL | Freq: Two times a day (BID) | OROMUCOSAL | Status: DC
  Administered 2012-04-23 (×2): 15 mL via OROMUCOSAL
  Filled 2012-04-23 (×2): qty 15

## 2012-04-23 MED ORDER — DILTIAZEM HCL 60 MG PO TABS
60.0000 mg | ORAL_TABLET | Freq: Four times a day (QID) | ORAL | Status: DC
  Administered 2012-04-23 – 2012-04-27 (×13): 60 mg via ORAL
  Filled 2012-04-23 (×21): qty 1

## 2012-04-23 MED ORDER — DILTIAZEM LOAD VIA INFUSION: 15.0000 mg | Freq: Once | INTRAVENOUS | Status: DC

## 2012-04-23 MED ORDER — WARFARIN SODIUM 5 MG PO TABS
5.0000 mg | ORAL_TABLET | Freq: Once | ORAL | Status: AC
  Administered 2012-04-23: 5 mg via ORAL
  Filled 2012-04-23: qty 1

## 2012-04-23 MED ORDER — DILTIAZEM LOAD VIA INFUSION
15.0000 mg | Freq: Once | INTRAVENOUS | Status: AC
  Administered 2012-04-23: 15 mg via INTRAVENOUS
  Filled 2012-04-23: qty 15

## 2012-04-23 MED ORDER — PREDNISONE 20 MG PO TABS
40.0000 mg | ORAL_TABLET | Freq: Every day | ORAL | Status: DC
  Administered 2012-04-24: 40 mg via ORAL
  Filled 2012-04-23 (×3): qty 2

## 2012-04-23 MED ORDER — INSULIN ASPART 100 UNIT/ML ~~LOC~~ SOLN
0.0000 [IU] | Freq: Three times a day (TID) | SUBCUTANEOUS | Status: DC
  Administered 2012-04-23: 3 [IU] via SUBCUTANEOUS
  Administered 2012-04-24: 1 [IU] via SUBCUTANEOUS
  Administered 2012-04-24: 2 [IU] via SUBCUTANEOUS

## 2012-04-23 MED ORDER — DEXTROSE 5 % IV SOLN
INTRAVENOUS | Status: DC
  Administered 2012-04-23: 13:00:00 via INTRAVENOUS

## 2012-04-23 MED ORDER — DILTIAZEM HCL 100 MG IV SOLR
5.0000 mg/h | INTRAVENOUS | Status: DC
  Administered 2012-04-24 – 2012-04-25 (×2): 10 mg/h via INTRAVENOUS
  Administered 2012-04-25 (×2): 15 mg/h via INTRAVENOUS
  Administered 2012-04-26: 10 mg/h via INTRAVENOUS
  Administered 2012-04-26: 5 mg/h via INTRAVENOUS
  Administered 2012-04-26: 15 mg/h via INTRAVENOUS
  Filled 2012-04-23 (×4): qty 100

## 2012-04-23 MED ORDER — PREDNISONE 20 MG PO TABS
40.0000 mg | ORAL_TABLET | Freq: Every day | ORAL | Status: DC
  Filled 2012-04-23 (×2): qty 2

## 2012-04-23 MED ORDER — BIOTENE DRY MOUTH MT LIQD
15.0000 mL | Freq: Two times a day (BID) | OROMUCOSAL | Status: DC
  Administered 2012-04-23 (×2): 15 mL via OROMUCOSAL

## 2012-04-23 MED ORDER — DILTIAZEM HCL 100 MG IV SOLR
5.0000 mg/h | INTRAVENOUS | Status: DC
  Administered 2012-04-23: 5 mg/h via INTRAVENOUS
  Filled 2012-04-23: qty 100

## 2012-04-23 MED ORDER — ENSURE COMPLETE PO LIQD
237.0000 mL | Freq: Two times a day (BID) | ORAL | Status: DC
  Administered 2012-04-23: 237 mL via ORAL

## 2012-04-23 MED ORDER — LEVALBUTEROL HCL 0.63 MG/3ML IN NEBU
0.6300 mg | INHALATION_SOLUTION | Freq: Four times a day (QID) | RESPIRATORY_TRACT | Status: DC
  Administered 2012-04-23 – 2012-05-29 (×140): 0.63 mg via RESPIRATORY_TRACT
  Filled 2012-04-23 (×156): qty 3

## 2012-04-23 MED ORDER — WARFARIN - PHARMACIST DOSING INPATIENT: Freq: Every day | Status: DC

## 2012-04-23 MED ORDER — POTASSIUM CHLORIDE 10 MEQ/50ML IV SOLN: 10.0000 meq | INTRAVENOUS | Status: DC

## 2012-04-23 MED ORDER — POTASSIUM CHLORIDE 10 MEQ/50ML IV SOLN
10.0000 meq | INTRAVENOUS | Status: AC
  Administered 2012-04-23 (×4): 10 meq via INTRAVENOUS
  Filled 2012-04-23: qty 100
  Filled 2012-04-23: qty 200

## 2012-04-23 MED ORDER — DILTIAZEM HCL 100 MG IV SOLR
5.0000 mg/h | INTRAVENOUS | Status: DC
  Administered 2012-04-23: 15 mg/h via INTRAVENOUS
  Filled 2012-04-23 (×2): qty 100

## 2012-04-23 NOTE — Progress Notes (Signed)
CRITICAL CARE RESIDENT NOTE Interim Progress Note   Subjective:    I was called overnight by the RN for evaluation of new atrial fibrillation.  At time of evaluation, pt's heart rate is fluctuating between 120 and 150.  BP was has been maintained and the patient is asymptomatic.  Patient states that he has never been told he had an irregular heart beat before.  He denies chest pain, SOB, or palpitations.    Objective:    BP 143/83  Pulse 112  Temp 97 F (36.1 C) (Oral)  Resp 25  Ht 5\' 8"  (1.727 m)  Wt 218 lb 7.6 oz (99.1 kg)  BMI 33.22 kg/m2  SpO2 86%   Labs: Basic Metabolic Panel:    Component Value Date/Time   NA 146* 04/22/2012 0358   K 3.7 04/22/2012 0358   CL 102 04/22/2012 0358   CO2 39* 04/22/2012 0358   BUN 46* 04/22/2012 0358   CREATININE 0.60 04/22/2012 0358   GLUCOSE 183* 04/22/2012 0358   CALCIUM 9.3 04/22/2012 0358    CBC:    Component Value Date/Time   WBC 11.8* 04/22/2012 0358   HGB 16.9 04/22/2012 0358   HCT 55.0* 04/22/2012 0358   PLT 152 04/22/2012 0358   MCV 99.8 04/22/2012 0358   NEUTROABS 9.6* 04/22/2012 0358   LYMPHSABS 0.9 04/22/2012 0358   MONOABS 1.3* 04/22/2012 0358   EOSABS 0.0 04/22/2012 0358   BASOSABS 0.0 04/22/2012 0358    Cardiac Enzymes: Lab Results  Component Value Date   TROPONINI <0.30 04/13/2012    Physical Exam: General: Vital signs reviewed and noted. Well-developed, well-nourished, in no acute distress; alert, appropriate and cooperative throughout examination.  Lungs:  Normal respiratory effort. Receiving nebulizer at this time.  Mild rhonchi that clear with cough.   Heart: Irregularly irregular rate and rhythm, S1 and S2 normal without gallop, murmur, or rubs.  Abdomen:  BS normoactive. Soft, Nondistended, non-tender.  No masses or organomegaly.  Extremities: No pretibial edema.     Assessment/ Plan:    1. Atrial fibrillation:  Patient was noted in atrial fibrillation around 10 pm.  He was given 1 dose of IV metoprolol that helped  slow his rate but he continued in atrial fibrillation with rates between 120-150.  Blood pressure is holding and the patient is asymptomatic at this time.  We will plan to start a diltiazem drip and titrate for a HR <105.  We will also get a full 12 lead to evaluate.   Signed: Leodis Sias, MD  PGY-III, Internal Medicine Resident Pager: [phone] (7PM-7AM) 04/23/2012, 1:23 AM

## 2012-04-23 NOTE — Progress Notes (Signed)
eLink Physician-Brief Progress Note Patient Name: RUDY LUHMANN DOB: 09-30-1950 MRN: [id number]  Date of Service  04/23/2012   HPI/Events of Note    Lab 04/23/12 0400 04/22/12 0358 04/21/12 0500 04/20/12 0430 04/19/12 0435 04/18/12 0433  NA 154* 146* 147* 145 146* --  K 3.4* 3.7 -- -- -- --  CL 106 102 102 102 101 --  CO2 39* 39* 39* 39* 40* --  GLUCOSE 136* 183* 164* 142* 117* --  BUN 43* 46* 46* 36* 33* --  CREATININE 0.58 0.60 0.64 0.68 0.65 --  CALCIUM 9.6 9.3 9.5 9.6 9.3 --  MG 2.3 -- -- -- -- 2.2  PHOS 4.2 3.2 3.8 3.8 3.4 --    Estimated Creatinine Clearance: 107.9 ml/min (by C-G formula based on Cr of 0.58).  Intake/Output      01/15 0701 - 01/16 0700   I.V. (mL/kg) 454.4 (4.6)   NG/GT 60   IV Piggyback 404   Total Intake(mL/kg) 918.4 (9.3)   Urine (mL/kg/hr) 2495 (1)   Stool 600   Total Output 3095   Net -2176.6          ASSESSMENT Mild hypokalemia  eICU Interventions  kcl 40 iv   ASSESSMENT: MAJOR ELECTROLYTE  Intervention Category Major Interventions: Arrhythmia - evaluation and management Intermediate Interventions: Respiratory distress - evaluation and management  Cadence Minton 04/23/2012, 6:08 AM

## 2012-04-23 NOTE — Progress Notes (Signed)
ANTIBIOTIC CONSULT NOTE - F/U   Pharmacy Consult for Vancomycin  Indication: rule out pneumonia  No Known Allergies  Patient Measurements: Height: 5\' 8"  (172.7 cm) Weight: 218 lb 7.6 oz (99.1 kg) IBW/kg (Calculated) : 68.4   Vital Signs: Temp: 97.3 F (36.3 C) (01/16 1300) Temp src: Oral (01/16 1300) BP: 131/83 mmHg (01/16 1100) Pulse Rate: 79  (01/16 1200) Intake/Output from previous day: 01/15 0701 - 01/16 0700 In: 1103.4 [I.V.:539.4; NG/GT:60; IV Piggyback:504] Out: 3225 [Urine:2625; Stool:600] Intake/Output from this shift: Total I/O In: 215 [I.V.:165; IV Piggyback:50] Out: 90 [Urine:90]  Labs:  Basename 04/23/12 0400 04/22/12 0358 04/21/12 0500  WBC 13.6* 11.8* 11.4*  HGB 19.1* 16.9 16.9  PLT 153 152 153  LABCREA -- -- --  CREATININE 0.58 0.60 0.64   Estimated Creatinine Clearance: 107.9 ml/min (by C-G formula based on Cr of 0.58). No results found for this basename: VANCOTROUGH:2,VANCOPEAK:2,VANCORANDOM:2,GENTTROUGH:2,GENTPEAK:2,GENTRANDOM:2,TOBRATROUGH:2,TOBRAPEAK:2,TOBRARND:2,AMIKACINPEAK:2,AMIKACINTROU:2,AMIKACIN:2, in the last 72 hours   Microbiology: Recent Results (from the past 720 hour(s))  URINE CULTURE     Status: Normal   Collection Time   04/12/12  7:57 PM      Component Value Range Status Comment   Specimen Description URINE, CATHETERIZED   Final    Special Requests NONE   Final    Culture  Setup Time 04/13/2012 02:43   Final    Colony Count NO GROWTH   Final    Culture NO GROWTH   Final    Report Status 04/14/2012 FINAL   Final   CULTURE, BLOOD (ROUTINE X 2)     Status: Normal   Collection Time   04/12/12 10:15 PM      Component Value Range Status Comment   Specimen Description BLOOD RIGHT HAND   Final    Special Requests BOTTLES DRAWN AEROBIC ONLY 3CC   Final    Culture  Setup Time 04/13/2012 05:42   Final    Culture NO GROWTH 5 DAYS   Final    Report Status 04/19/2012 FINAL   Final   CULTURE, BLOOD (ROUTINE X 2)     Status: Normal   Collection Time   04/12/12 10:26 PM      Component Value Range Status Comment   Specimen Description BLOOD LEFT HAND   Final    Special Requests BOTTLES DRAWN AEROBIC ONLY 3CC   Final    Culture  Setup Time 04/13/2012 05:42   Final    Culture NO GROWTH 5 DAYS   Final    Report Status 04/19/2012 FINAL   Final   MRSA PCR SCREENING     Status: Normal   Collection Time   04/13/12 12:29 AM      Component Value Range Status Comment   MRSA by PCR NEGATIVE  NEGATIVE Final   URINE CULTURE     Status: Normal   Collection Time   04/13/12 12:33 AM      Component Value Range Status Comment   Specimen Description URINE, CATHETERIZED   Final    Special Requests Normal   Final    Culture  Setup Time 04/13/2012 01:56   Final    Colony Count NO GROWTH   Final    Culture NO GROWTH   Final    Report Status 04/14/2012 FINAL   Final   CULTURE, RESPIRATORY     Status: Normal   Collection Time   04/13/12  4:46 AM      Component Value Range Status Comment   Specimen Description TRACHEAL ASPIRATE  Final    Special Requests Normal   Final    Gram Stain     Final    Value: FEW WBC PRESENT, PREDOMINANTLY PMN     NO SQUAMOUS EPITHELIAL CELLS SEEN     RARE GRAM NEGATIVE RODS   Culture     Final    Value: MODERATE HAEMOPHILUS INFLUENZAE     Note: BETA LACTAMASE NEGATIVE   Report Status 04/15/2012 FINAL   Final   LEGIONELLA CULTURE     Status: Normal   Collection Time   04/13/12  5:01 PM      Component Value Range Status Comment   Specimen Description BRONCHIAL WASHINGS   Final    Special Requests NONE   Final    Culture NO LEGIONELLA ISOLATED   Final    Report Status 04/19/2012 FINAL   Final   CULTURE, RESPIRATORY     Status: Normal   Collection Time   04/13/12  5:01 PM      Component Value Range Status Comment   Specimen Description BRONCHIAL WASHINGS   Final    Special Requests NONE   Final    Gram Stain     Final    Value: ABUNDANT WBC PRESENT,BOTH PMN AND MONONUCLEAR     NO SQUAMOUS EPITHELIAL CELLS  SEEN     NO ORGANISMS SEEN   Culture     Final    Value: RARE HAEMOPHILUS INFLUENZAE     Note: BETA LACTAMASE NEGATIVE   Report Status 04/16/2012 FINAL   Final   RESPIRATORY VIRUS PANEL     Status: Abnormal   Collection Time   04/13/12  5:05 PM      Component Value Range Status Comment   Source - RVPAN BRONCHIAL WASHINGS   Final    Respiratory Syncytial Virus A NOT DETECTED   Final    Respiratory Syncytial Virus B NOT DETECTED   Final    Influenza A DETECTED (*)  Final    Influenza B NOT DETECTED   Final    Parainfluenza 1 NOT DETECTED   Final    Parainfluenza 2 NOT DETECTED   Final    Parainfluenza 3 NOT DETECTED   Final    Metapneumovirus DETECTED (*)  Final    Rhinovirus NOT DETECTED   Final    Adenovirus NOT DETECTED   Final    Influenza A H1 NOT DETECTED   Final    Influenza A H3 NOT DETECTED   Final   CULTURE, BLOOD (ROUTINE X 2)     Status: Normal (Preliminary result)   Collection Time   04/20/12 12:50 AM      Component Value Range Status Comment   Specimen Description BLOOD LEFT ARM   Final    Special Requests BOTTLES DRAWN AEROBIC ONLY 10CC   Final    Culture  Setup Time 04/20/2012 08:43   Final    Culture     Final    Value:        BLOOD CULTURE RECEIVED NO GROWTH TO DATE CULTURE WILL BE HELD FOR 5 DAYS BEFORE ISSUING A FINAL NEGATIVE REPORT   Report Status PENDING   Incomplete   CULTURE, BLOOD (ROUTINE X 2)     Status: Normal (Preliminary result)   Collection Time   04/20/12  1:10 AM      Component Value Range Status Comment   Specimen Description BLOOD LEFT ARM   Final    Special Requests BOTTLES DRAWN AEROBIC ONLY 10CC   Final  Culture  Setup Time 04/20/2012 08:43   Final    Culture     Final    Value:        BLOOD CULTURE RECEIVED NO GROWTH TO DATE CULTURE WILL BE HELD FOR 5 DAYS BEFORE ISSUING A FINAL NEGATIVE REPORT   Report Status PENDING   Incomplete   CATH TIP CULTURE     Status: Normal (Preliminary result)   Collection Time   04/20/12  5:06 PM       Component Value Range Status Comment   Specimen Description CATH TIP CENTRAL LINE   Final    Special Requests NONE   Final    Culture     Final    Value: 70 COLONIES STAPHYLOCOCCUS SPECIES (COAGULASE NEGATIVE)     Note: RIFAMPIN AND GENTAMICIN SHOULD NOT BE USED AS SINGLE DRUGS FOR TREATMENT OF STAPH INFECTIONS.   Report Status PENDING   Incomplete   CLOSTRIDIUM DIFFICILE BY PCR     Status: Normal   Collection Time   04/21/12  9:01 PM      Component Value Range Status Comment   C difficile by pcr NEGATIVE  NEGATIVE Final     Medical History: Past Medical History  Diagnosis Date  . Stroke   . Hypertension   . Hyperlipidemia   . GERD (gastroesophageal reflux disease)   . COPD (chronic obstructive pulmonary disease)    Assessment: 62 year old male on D#12 of abx, D#4 restarted on Vancomycin for spiking fevers and infection of left IJ line. Line was removed; currently continuing on Vancomycin. WBC are slightly elevated at 13.6, afebrile.   Zosyn 1/5>>1/10 Azithro 1/5>> 1/8 Tamiflu 1/6 >>1/15 Vanc 1/6 >>1/8, 1/13>> Ceftriaxone 1/10>>1/15  1/5 UCx - NEG 1/5 Blood Cx>>negative 1/6 Resp Cx>> H. Infl.  Strep pneumo (-) /Legionella>>neg Flu pcr - NEG 1/6 Resp virus panel>> + for Flu  1/13 BCx>>  Goal of Therapy:  Vancomycin trough level 15-20 mcg/ml  Plan:  Continue Vancomycin 1g IV q12h F/U clinical status, renal function  [employer], Pharm.D. Clinical Pharmacist   Pager: [phone] 04/23/2012 2:10 PM

## 2012-04-23 NOTE — Progress Notes (Signed)
PULMONARY  / CRITICAL CARE MEDICINE  Name: Cody [name] MRN: [id number] DOB: [birth year]    LOS: 11  REFERRING MD:   EDP - Dr. Preston Fleeting  CHIEF COMPLAINT:  Shortness of breath  BRIEF PATIENT DESCRIPTION: 62 yo M with COPD and 3ppd smoking admitted 04/12/2012 with acute respiratory failure 2/2 influenza A and aspiration PNA, required intubated 04/13/11 for airway protection and aspirated during intubation.   LINES / TUBES: ETT 1/5 >>1/15  L IJ 1/5 >> 1/13 R IJ 1/13 >>  R A-line 1/5 >>1/8 pulled out by pt   CULTURES: Blood culture 1/5>> Negative Resp culture 1/6>> H. influenzae  Resp viral panel 1/6>> Influenza A and Metapneumovirus  Sputum Leigonella Culture 1/6>> Negative Bronch Sputum 1/6>> H. influenzae  Urine 1/5>> Neg  Urine 1/6>> Neg  Flu 1/5>> Neg Blood culture 01/13 >> Catheter tip culture 1/13 >> C.diff PCR 1/14 >> Negative  ANTIBIOTICS: Zosyn 1/5>>1/9  Azithro 1/5>>1/8  Vanc 1/6>>>1/8 - then restarted 01/13 >>> Tamiflu 1/6>>>1/16  Rocephin 1/9>>1/15   SIGNIFICANT EVENTS:  1/5 Intubated in ED  1/5 Shock , levophed  1/6 ECHO - 55%, mod dil rv, PA pressures>>> not measured b/c no TR jet  1/6 Bronched- diffuse pus all lobes  1/7 increasing pCO2, increased RR on vent  1/8 Changed sedation to Precedex, more agitated than with versed  1/9 Changed sedation back to Versed, agitation greatly improved  1/10 scheduled haldol  1/12 Restarting precedex gtt due to persistent agitation 1/13 Febrile to 102.6 overnight. ? Catheter infection? DC'ed L IJ, placed new R IJ. 1/14 No fevers overnight, agitation controlled on propofol (added 1/13), more interactive. 1/15 Had foul smelling stools overnight, therefore, C.diff PCR was sent by RN. 1/15 EXTUBATED, developed A.fib with RVR overnight.   LEVEL OF CARE:  ICU PRIMARY SERVICE:  PCCM CONSULTANTS:  None CODE STATUS: Full DIET:  Tube feeds DVT Px:  Heparin GI Px:  No longer on protonix (on board when  intubated)   SUBJECTIVE / INTERVAL HISTORY:  Patient indicates he is feeling better, appreciates being off of the vent. His sore throat is improving. He continues to have some shortness of breath, that is significantly improved from prior. He states at baseline he has tachycardia, is unaware of history of A.fib  Interval Events: - Had A.fib with RVR with rates to 150s on 1/15, was given Metoprolol, HR remained elevated --> started on diltiazem gtt.   VITAL SIGNS: Temp:  [97 F (36.1 C)-98.3 F (36.8 C)] 97 F (36.1 C) (01/16 0857) Pulse Rate:  [61-112] 79  (01/16 1200) Resp:  [16-28] 25  (01/16 1200) BP: (117-177)/(75-107) 131/83 mmHg (01/16 1100) SpO2:  [85 %-95 %] 93 % (01/16 1200) FiO2 (%):  [40 %-50 %] 50 % (01/16 1100) HEMODYNAMICS: CVP:  [6 mmHg-57 mmHg] 57 mmHg VENTILATOR SETTINGS: Vent Mode:  [-] PSV FiO2 (%):  [40 %-50 %] 50 % PEEP:  [5 cmH20] 5 cmH20 Pressure Support:  [5 cmH20] 5 cmH20 INTAKE / OUTPUT: Intake/Output      01/15 0701 - 01/16 0700 01/16 0701 - 01/17 0700   I.V. (mL/kg) 539.4 (5.4) 165 (1.7)   NG/GT 60    IV Piggyback 504 50   Total Intake(mL/kg) 1103.4 (11.1) 215 (2.2)   Urine (mL/kg/hr) 2625 (1.1) 90 (0.2)   Stool 600    Total Output 3225 90   Net -2121.6 +125          PHYSICAL EXAMINATION: General: Sitting upright in his chair. Comfortable, no acute distress.  Head: Normocephalic, atraumatic.  Lungs:  Normal respiratory effort. Basilar crackles. No wheezing.  Heart: Irregularly irregular rhythm, tachycardic.  Abdomen:  BS normoactive. Soft, Nondistended, non-tender.  No masses or organomegaly.  Extremities: No pretibial edema.     IMAGING: 1/13 - CXR - 1. Right IJ line tip: SVC. No pneumothorax. Otherwise essentially stable. 1/15 - CXR - Stable fibrotic lung disease and slightly improved right lung aeration.   MEDICATIONS: Infusions    . sodium chloride 20 mL/hr at 04/21/12 0800  . diltiazem (CARDIZEM) infusion     Scheduled     . albuterol  2.5 mg Nebulization Q6H  . antiseptic oral rinse  15 mL Mouth Rinse q12n4p  . chlorhexidine  15 mL Mouth Rinse BID  . diltiazem  60 mg Oral Q6H  . feeding supplement  237 mL Oral BID BM  . heparin  5,000 Units Subcutaneous Q8H  . insulin aspart  2-6 Units Subcutaneous Q4H  . ipratropium  0.5 mg Nebulization Q6H  . predniSONE  40 mg Oral Q breakfast  . vancomycin  1,000 mg Intravenous Q12H    DIAGNOSES: Principal Problem:  *Acute respiratory failure with hypercapnia Active Problems:  HYPERLIPIDEMIA  TOBACCO USE  HYPERTENSION  GERD  COPD (chronic obstructive pulmonary disease)  Acute encephalopathy  Acute kidney injury   ASSESSMENT / PLAN:  PULMONARY  Lab 04/22/12 1307 04/20/12 0403 04/18/12 0240  PHART 7.430 7.353 7.337*  PCO2ART 65.5* 74.5* 84.2*  PO2ART 55.0* 68.0* 92.9  HCO3 43.3* 41.1* 43.9*  TCO2 45 43 46.5   A: 1) Acute hypercapnic respiratory failure/ARDS - secondary to flu, COPD exacerbation. Requiring increased FiO2 /PEEP 1/13 likely contributed by agitation. Extubated 1/15. 2) Aspiration - at time of intubation, finished course of treatment  3) Pneumonitis vs. CAP - b/l interstitial opacities on cxr, slightly increased at left, likely atelectasis. Completed 8/8 days rocephin  4) Pulmonary edema - worsening per CXR on 1/12 --> Lasix. 4) Non-oxygen dependent COPD - h/o chronic tobacco abuse hx 3 pack per day 20+ years. On symbicort at home. Not on home O2 therapy. 5) Bloody secretions from aspiration - Pulm /renal syndrome ruled out.   P:  - Duonebs q6h + q2h PRN. - Change to oral steroids. - O2 sat goal > 88%. - Smoking cessation education will be needed.   CARDIOVASCULAR No results found for this basename: CKTOTAL:3,CKMB:3,TROPONINI:3 in the last 168 hours  Lab 04/19/12 1228  PROBNP 398.2*   A:  1) New onset atrial fibrillation with RVR - likely 2/2 volume depletion, as pt was being diuresed as per ARDS protocol. Hypernatremia this  AM in setting of lasix use further suggests this. Will need some low dose fluids. His CHADS score is 3, therefore, may require anticoagulation. May be a candidate for cardioversion given < 48h since onset. 2) Elevated proBNP - ProBNP on admission 17911 --> 398 on 1/12. Trop x3 negative. ECHO with normal systolic and diastolic dysfunction, EF 50-55%.  3) HTN -  BPs remain elevated. On home regimen Lisinopril 40mg , Norvasc 5mg , and HCTZ 25mg  qd.  4) Hypotension - Resolved. Initially contributed by septic shock likely, previously requiring pressors. Resolved. Now hypertensive.  PLAN:  - DC Lasix. - Continue steroids --> change to oral-taper over 2 weeks - Increase amlodipine to 10mg  - Consult [facility] for new onset A. Fib (likely in setting of volume depletion) - specific question is regarding anticoagulation versus candidacy for cardioversion given new onset. - Will change IV diltiazem to oral 60mg  q6h (2-3  hour overlap). - Goal HR < 120. - Will start low rate of IVF.   RENAL  Lab 04/23/12 0400 04/22/12 0358 04/21/12 0500  NA 154* 146* 147*  K 3.4* 3.7 4.5  CL 106 102 102  CO2 39* 39* 39*  BUN 43* 46* 46*  CREATININE 0.58 0.60 0.64  GLUCOSE 136* 183* 164*   A:  1) Acute Kidney injury - Resolved. Previously likely pre-renal in setting of volume depletion. Previously urinary sediment clogging foley, improved.  2) Hypernatremia - worsening 2/2 volume depletion in setting of lasix usage.  P:  - Change fluids to D5W. - Continue to monitor renal function.   GASTROINTESTINAL  Lab 04/23/12 0400 04/22/12 0358  AST 25 19  ALT 24 21  ALKPHOS 68 59  BILITOT 0.9 0.4  PROT 6.6 6.1  ALBUMIN 2.8* 2.4*   A:  1) GERD 2) Diarrhea - resolving. C. Diff negative.  P: - Swallow study this AM --> start dysphagia diet per recs.   HEMATOLOGIC  Lab 04/23/12 0400 04/22/12 0358 04/21/12 0500  WBC 13.6* 11.8* 11.4*  HGB 19.1* 16.9 16.9  HCT 59.7* 55.0* 55.6*  PLT 153 152 153  APTT --  -- --  INR -- -- --    A:  1) Polycythemia- Hgb stable.  2) Leukocytosis - slight increase since yesterday, although remains afebrile. Vanc and DC of left IJ on 1/13 ( for presumed L IJ catheter infection). He remains on steroids.   P:  - Am CBC  - Sub q hep. - See ID.  INFECTIOUS No results found for this basename: LATICACIDVEN:3,PROCALCITON:3 in the last 168 hours 1) Flu pneumonia - Flu A and metapneumovirus  2) Aspirated at time of intubation, pneumonitis vs PNA 3) Nosocomial infection - likely catheter infection - Left IJ (in 8 days) was dc'ed on 1/13 and catheter tip cultures. Was resumed Vanc on 1/13 in this setting as UA and CXR otherwise not indicating of infection. 4) Diarrhea - C.diff neg. 5) Leukocytosis - remains afebrile, ? Stress response in setting of new onset A.fib? No new symptoms, CXR stable, don't expect new infective source. But, will remove all extra lines.  P:  - Continue tamiflu - day 10/10  - Completed 8 day course of Rocephin. - Continue Vancomycin for possible catheter infection - day 4 of 5 (narrow depending on catheter culture results. BUT may require longer treatment duration if bacteremia evidenced.) - F/U blood cultures and culture from line tip - pending (NGTD). - Will recheck urinalysis. - DC foley. - CBC with diff in AM. - After PIV established, will dc central line (Rt IJ).   ENDOCRINE  Lab 04/23/12 0828 04/23/12 0344 04/23/12 0027 04/22/12 2023 04/22/12 1618  GLUCAP 134* 146* 130* 94 132*   A: 1) On steroids - CBGs at goal. Lantus increased on 1/14. P:  - Cont ICU Hyperglycemic protocol. - CBG monitoring qAC and HS. - DC IV steroids --> oral steroids - will need to be tapered over 2 weeks. - Will DC Lantus, as was just on metformin at home and will be taken off of IV steroids.    NEUROLOGIC / PSYCHIATRIC RAAS Score: N/A CAM-ICU:      A:  1) Acute encephalopathy - Resolved. Likely secondary to hypoxia / hypercarbia and possible  infectious process (CAP?).  2) Delerium - Improving with Propofol + seroquel. No longer requiring precedex or versed.  P:  - Chair position as much as possible. - PRN Haldol   CLINICAL SUMMARY:  62 y.o. male heavy smoker with COPD presenting flu PNA< ARDS., aspiration. Now extubated 1/15 delirium improved. Will transfer for SDU, remove unneeded lines. Cardiology consult for new onset A.fib. Also will get PT/OT consult.   Signed: Johnette Abraham, D.OConsuello [place], Internal Medicine Resident Pager: [phone] (7AM-5PM)    Care during the described time interval was provided by me and/or other providers on the critical care team.  I have reviewed this patient's available data, including medical history, events of note, physical examination and test results as part of my evaluation  CC time x  31 m  ALVA,RAKESH V.

## 2012-04-23 NOTE — Evaluation (Signed)
Clinical/Bedside Swallow Evaluation Patient Details  Name: Cody [name] MRN: [id number] Date of Birth: 1951-03-22  Today's Date: 04/23/2012 Time: 1000-1030 SLP Time Calculation (min): 30 min  Past Medical History:  Past Medical History  Diagnosis Date  . Stroke   . Hypertension   . Hyperlipidemia   . GERD (gastroesophageal reflux disease)   . COPD (chronic obstructive pulmonary disease)    Past Surgical History: History reviewed. No pertinent past surgical history. HPI:  Cody [name] is a 62 year old white male with PMH of COPD, 3 pack per day smoking history 20+ years, and HTN presenting to the ED 1/5 with complaints of worsening shortness of breath x4 days.  History obtained by fiance.  Cody [name] has baseline shortness of breath especially on exertion, however, for the past 4 days he has been having increased difficulty and has also developed a worsening productive cough with white sputum and occasionally streaked with bright red blood.  Fiance reports increasing  confusion and extreme light headedmess when standing.  She denied him to have any headaches, fevers, LOC,  N/V/D, chest pain, abdominal pain, or any urinary complaints.  Diagnosed with potential influenza with aspiration PNA (? during intubation). Intubated due to respiratory distress 1/5. Extubated 1/15.    Assessment / Plan / Recommendation Clinical Impression  Pt presents with a functional appearing oropharyngeal swallow based on BSE. Pt consumed presented bolus with no overt signs/symptoms of aspiration at time of BSE. However, due to h/o of COPD and current O2 dependency intermittent breaks were cued by SLP to allow for adequate O2 intake. Pt is at increased risk of aspiration due to respiratory deficits, COPD, and length of time on vent and possible pneumonia upon admittance. Verbally discussed results with fiance who was present in the room during the eval. Educated Pt and fiance on aspiration risks and compensatory  strategies taken to reduce risk of aspiration. SLP will f/u briefly for diet check and to reinforce utilization of compensatory strategies and diet modifications.    Aspiration Risk  Moderate    Diet Recommendation Dysphagia 3 (Mechanical Soft);Thin liquid   Liquid Administration via: Cup;No straw Medication Administration: Whole meds with liquid Supervision: Patient able to self feed;Full supervision/cueing for compensatory strategies Compensations: Slow rate;Small sips/bites;Follow solids with liquid Postural Changes and/or Swallow Maneuvers: Seated upright 90 degrees       Follow Up Recommendations  None    Frequency and Duration min 2x/week  1 week   Pertinent Vitals/Pain n/a    SLP Swallow Goals Patient will utilize recommended strategies during swallow to increase swallowing safety with: Supervision/safety;Moderate assistance;Moderate cueing   Swallow Study    General Date of Onset: 04/12/12 HPI: Cody [name] is a 62 year old white male with PMH of COPD, 3 pack per day smoking history 20+ years, and HTN presenting to the ED 1/5 with complaints of worsening shortness of breath x4 days.  History obtained by fiance.  Cody [name] has baseline shortness of breath especially on exertion, however, for the past 4 days he has been having increased difficulty and has also developed a worsening productive cough with white sputum and occasionally streaked with bright red blood.  Fiance reports increasing  confusion and extreme light headedmess when standing.  She denied him to have any headaches, fevers, LOC,  N/V/D, chest pain, abdominal pain, or any urinary complaints.  Diagnosed with potential influenza with aspiration PNA (? during intubation). Intubated due to respiratory distress 1/5. Extubated 1/15.  Type of Study: Bedside swallow  evaluation Previous Swallow Assessment: none reported Diet Prior to this Study: NPO;IV Temperature Spikes Noted: No Respiratory Status: Supplemental O2  delivered via (comment) (50% FiO2 via ventimask) History of Recent Intubation: Yes Length of Intubations (days): 10 days Date extubated: 04/22/12 Behavior/Cognition: Alert;Cooperative;Pleasant mood Oral Cavity - Dentition: Missing dentition Self-Feeding Abilities: Able to feed self;Needs assist Patient Positioning: Upright in chair Baseline Vocal Quality: Clear Volitional Cough: Congested;Wet Volitional Swallow: Able to elicit    Oral/Motor/Sensory Function Overall Oral Motor/Sensory Function: Appears within functional limits for tasks assessed Labial ROM: Within Functional Limits Labial Symmetry: Within Functional Limits Labial Strength: Within Functional Limits Labial Sensation: Within Functional Limits Lingual ROM: Within Functional Limits Lingual Symmetry: Within Functional Limits Lingual Strength: Within Functional Limits Lingual Sensation: Within Functional Limits Facial ROM: Within Functional Limits Facial Symmetry: Within Functional Limits Facial Strength: Within Functional Limits Facial Sensation: Within Functional Limits Velum: Within Functional Limits Mandible: Within Functional Limits   Ice Chips Ice chips: Within functional limits Presentation: Spoon;Self Fed;Cup   Thin Liquid Thin Liquid: Within functional limits Presentation: Cup;Self Fed    Nectar Thick Nectar Thick Liquid: Not tested   Honey Thick Honey Thick Liquid: Not tested   Puree Puree: Within functional limits Presentation: Self Fed;Spoon   Solid   GO   [name] MA, CCC-SLP [phone]  Solid: Within functional limits (requested bite pudding to soften cracker) Presentation: Self Fed       [name] Meryl 04/23/2012,1:32 PM

## 2012-04-23 NOTE — Progress Notes (Signed)
NUTRITION FOLLOW UP  Intervention:    Ensure Complete PO BID, each supplement provides 350 kcal and 13 grams of protein.  Nutrition Dx:   Inadequate oral intake related to recent critical illness as evidenced by diet just advanced, ongoing.  Goal:   Intake to meet >90% of estimated nutrition needs, unmet.  Monitor:   PO intake, labs, weight trend.  Assessment:   Patient was extubated 1/15.  SLP following for diet advancement.  Diet was advanced to Dysphagia 3 with thin liquids today.  Patient has not had a meal yet.  Expect PO intake will be poor at least for the next few days given recent critical illness.  Height: Ht Readings from Last 1 Encounters:  04/16/12 5\' 8"  (1.727 m)    Weight Status:  Trending down Wt Readings from Last 1 Encounters:  04/22/12 218 lb 7.6 oz (99.1 kg)  1/13 102.9 kg 1/6 105 kg  Re-estimated needs:  Kcal: 2150 Protein: 120-135 gm Fluid: 2.2 L  Skin: redness on inner thighs and abrasion on buttocks  Diet Order: Dysphagia 3 with thin liquids   Intake/Output Summary (Last 24 hours) at 04/23/12 1113 Last data filed at 04/23/12 1100  Gross per 24 hour  Intake 1127.83 ml  Output   2460 ml  Net -1332.17 ml    Last BM: 1/15 (diarrhea)   Labs:   Lab 04/23/12 0400 04/22/12 0358 04/21/12 0500 04/18/12 0433  NA 154* 146* 147* --  K 3.4* 3.7 4.5 --  CL 106 102 102 --  CO2 39* 39* 39* --  BUN 43* 46* 46* --  CREATININE 0.58 0.60 0.64 --  CALCIUM 9.6 9.3 9.5 --  MG 2.3 -- -- 2.2  PHOS 4.2 3.2 3.8 --  GLUCOSE 136* 183* 164* --    CBG (last 3)   Basename 04/23/12 0828 04/23/12 0344 04/23/12 0027  GLUCAP 134* 146* 130*    Scheduled Meds:    . albuterol  2.5 mg Nebulization Q6H  . antiseptic oral rinse  15 mL Mouth Rinse q12n4p  . chlorhexidine  15 mL Mouth Rinse BID  . heparin  5,000 Units Subcutaneous Q8H  . insulin aspart  2-6 Units Subcutaneous Q4H  . insulin glargine  5 Units Subcutaneous Daily  . ipratropium  0.5 mg  Nebulization Q6H  . methylPREDNISolone sodium succinate  40 mg Intravenous Q12H  . vancomycin  1,000 mg Intravenous Q12H    Continuous Infusions:    . sodium chloride 20 mL/hr at 04/21/12 0800  . diltiazem (CARDIZEM) infusion 15 mg/hr (04/23/12 0700)    Joaquin Courts, RD, LDN, CNSC Pager# [phone] After Hours Pager# [phone]

## 2012-04-23 NOTE — Progress Notes (Signed)
ANTICOAGULATION CONSULT NOTE - Follow Up Consult  Pharmacy Consult for Coumadin Indication: atrial fibrillation  No Known Allergies  Patient Measurements: Height: 5\' 8"  (172.7 cm) Weight: 218 lb 7.6 oz (99.1 kg) IBW/kg (Calculated) : 68.4   Vital Signs: Temp: 97.3 F (36.3 C) (01/16 1300) Temp src: Oral (01/16 1300) BP: 131/83 mmHg (01/16 1100) Pulse Rate: 79  (01/16 1200)  Labs:  Basename 04/23/12 1200 04/23/12 0400 04/22/12 0358 04/21/12 0500  HGB -- 19.1* 16.9 --  HCT -- 59.7* 55.0* 55.6*  PLT -- 153 152 153  APTT -- -- -- --  LABPROT -- -- -- --  INR -- -- -- --  HEPARINUNFRC -- -- -- --  CREATININE -- 0.58 0.60 0.64  CKTOTAL -- -- -- --  CKMB -- -- -- --  TROPONINI <0.30 -- -- --    Estimated Creatinine Clearance: 107.9 ml/min (by C-G formula based on Cr of 0.58).   Medications:  Scheduled:    . antiseptic oral rinse  15 mL Mouth Rinse q12n4p  . chlorhexidine  15 mL Mouth Rinse BID  . [COMPLETED] diltiazem  15 mg Intravenous Once  . diltiazem  60 mg Oral Q6H  . feeding supplement  237 mL Oral BID BM  . heparin  5,000 Units Subcutaneous Q8H  . insulin aspart  0-9 Units Subcutaneous TID WC  . ipratropium  0.5 mg Nebulization Q6H  . levalbuterol  0.63 mg Nebulization Q6H  . [COMPLETED] metoprolol  5 mg Intravenous Once  . [COMPLETED] oseltamivir  150 mg Oral BID  . [COMPLETED] potassium chloride  10 mEq Intravenous Q1 Hr x 4  . predniSONE  40 mg Oral Q breakfast  . vancomycin  1,000 mg Intravenous Q12H  . [DISCONTINUED] albuterol  2.5 mg Nebulization Q6H  . [DISCONTINUED] albuterol-ipratropium  6 puff Inhalation Q4H  . [DISCONTINUED] amLODipine  10 mg Oral Daily  . [DISCONTINUED] cefTRIAXone (ROCEPHIN) 1 g IVPB  1 g Intravenous Q24H  . [DISCONTINUED] diltiazem  15 mg Intravenous Once  . [DISCONTINUED] furosemide  20 mg Intravenous BID  . [DISCONTINUED] insulin aspart  2-6 Units Subcutaneous Q4H  . [DISCONTINUED] insulin glargine  5 Units Subcutaneous  Daily  . [DISCONTINUED] methylPREDNISolone sodium succinate  40 mg Intravenous Q12H  . [DISCONTINUED] pantoprazole sodium  40 mg Per Tube Daily  . [DISCONTINUED] potassium chloride  10 mEq Intravenous Q1 Hr x 4  . [DISCONTINUED] predniSONE  40 mg Oral Q breakfast    Assessment: 62 year old male with atrial fibrillation to begin anticoagulation with Coumadin.  No baseline INR available.  He has been critically ill and on multiple antibiotics so will start with a lower Coumadin dose than recommended.  Goal of Therapy:  INR 2-3   Plan:  Check baseline INR now.  If <1.4, start with: Coumadin 5mg  PO x 1 today Daily PT/INR  Estella Husk, Pharm.D., BCPS Clinical Pharmacist  Phone [phone] Pager [phone] 04/23/2012, 3:35 PM

## 2012-04-23 NOTE — Evaluation (Signed)
Physical Therapy Evaluation Patient Details Name: Cody [name] MRN: [id number] DOB: 09/20/1950 Today's Date: 04/23/2012 Time: 4098-1191 PT Time Calculation (min): 31 min  PT Assessment / Plan / Recommendation Clinical Impression  pt admitted  with 4 day h/o SOB.  Went into resp failure and has recently been intubated.  Pt will benefit from PT to maximize I.    PT Assessment  Patient needs continued PT services    Follow Up Recommendations  Home health PT;Supervision/Assistance - 24 hour;Other (comment) (But is weak enough that he may ultimately need ST SNF )    Does the patient have the potential to tolerate intense rehabilitation      Barriers to Discharge None      Equipment Recommendations  Rolling walker with 5" wheels    Recommendations for Other Services     Frequency Min 3X/week    Precautions / Restrictions Precautions Precautions: Fall Restrictions Weight Bearing Restrictions: No   Pertinent Vitals/Pain       Mobility  Bed Mobility Bed Mobility: Sit to Supine Sit to Supine: 3: Mod assist;HOB flat Details for Bed Mobility Assistance: vc's for best technique Transfers Transfers: Sit to Stand;Stand to Sit Sit to Stand: 1: +2 Total assist;With upper extremity assist;From chair/3-in-1 Sit to Stand: Patient Percentage: 60% Stand to Sit: 1: +2 Total assist;With upper extremity assist;To bed Stand to Sit: Patient Percentage: 60% (or greater) Details for Transfer Assistance: vc's for hand placement; some lifting assist Ambulation/Gait Ambulation/Gait Assistance: 1: +2 Total assist Ambulation/Gait: Patient Percentage: 60% Ambulation Distance (Feet): 5 Feet (forward and back) Assistive device: Rolling walker Ambulation/Gait Assistance Details: vc's for postural checks and postural support and steadying assist Gait Pattern: Step-to pattern;Shuffle;Trunk flexed Wheelchair Mobility Wheelchair Mobility: No    Shoulder Instructions     Exercises     PT  Diagnosis: Difficulty walking;Generalized weakness  PT Problem List: Decreased strength;Decreased activity tolerance;Decreased balance;Decreased mobility;Decreased knowledge of use of DME PT Treatment Interventions: DME instruction;Gait training;Stair training;Functional mobility training;Therapeutic activities;Balance training;Patient/family education   PT Goals Acute Rehab PT Goals PT Goal Formulation: With patient Time For Goal Achievement: 04/23/12 Potential to Achieve Goals: Good Pt will go Supine/Side to Sit: with supervision PT Goal: Supine/Side to Sit - Progress: Goal set today Pt will go Sit to Supine/Side: with supervision PT Goal: Sit to Supine/Side - Progress: Goal set today Pt will go Sit to Stand: with supervision PT Goal: Sit to Stand - Progress: Goal set today Pt will go Stand to Sit: with supervision PT Goal: Stand to Sit - Progress: Goal set today Pt will Ambulate: 51 - 150 feet;with supervision;with least restrictive assistive device PT Goal: Ambulate - Progress: Goal set today Pt will Go Up / Down Stairs: 3-5 stairs;with min assist;with rail(s);Other (comment) (if to go directly home) PT Goal: Up/Down Stairs - Progress: Goal set today  Visit Information  Last PT Received On: 04/23/12 Assistance Needed: +2    Subjective Data  Subjective: My strength is nonexistent   Prior Functioning  Home Living Lives With: Spouse;Other (Comment) (and 3 daughters) Available Help at Discharge: Family;Available 24 hours/day Type of Home: Mobile home Home Access: Stairs to enter [employer] of Steps: 3 Entrance Stairs-Rails: Right;Left Home Layout: One level Bathroom Shower/Tub: Health visitor: Standard Bathroom Accessibility: Yes How Accessible: Accessible via walker Home Adaptive Equipment: None Prior Function Level of Independence: Independent Able to Take Stairs?: Yes Driving: Yes Vocation: Full time  employment Communication Communication: No difficulties    Cognition  Arousal/Alertness: Awake/alert Orientation  Level: Appears intact for tasks assessed Behavior During Session: [facility] for tasks performed    Extremity/Trunk Assessment Right Lower Extremity Assessment RLE ROM/Strength/Tone: Deficits RLE ROM/Strength/Tone Deficits: significantly weak bil at >=3/5 RLE Sensation: WFL - Light Touch RLE Coordination: WFL - gross/fine motor Left Lower Extremity Assessment LLE ROM/Strength/Tone: Deficits LLE ROM/Strength/Tone Deficits: see R LE LLE Coordination: WFL - gross/fine motor Trunk Assessment Trunk Assessment: Normal   Balance Balance Balance Assessed: Yes Static Sitting Balance Static Sitting - Balance Support: Feet supported;Right upper extremity supported;Left upper extremity supported Static Sitting - Level of Assistance: 5: Stand by assistance Static Standing Balance Static Standing - Balance Support: Bilateral upper extremity supported;During functional activity Static Standing - Level of Assistance: 4: Min assist  End of Session PT - End of Session Activity Tolerance: Patient tolerated treatment well;Patient limited by fatigue Patient left: in bed;with call bell/phone within reach;with family/visitor present Nurse Communication: Mobility status  GP     Deneisha Dade, Eliseo Gum 04/23/2012, 3:39 PM  04/23/2012  [place] Bing, PT [phone] (pager)

## 2012-04-23 NOTE — Consult Note (Signed)
CARDIOLOGY CONSULT NOTE  Patient ID: Cody [name], MRN: [id number], DOB/AGE: 08-27-1950 62 y.o. Admit date: 04/12/2012 Date of Consult: 04/23/2012  Primary Physician: Judie Petit, MD Primary Cardiologist: None  Chief Complaint: shortness of breath Reason for Consultation: A.Fib w/ RVR  HPI: 62 y.o. male w/ PMHx significant for HTN, tobacco abuse, COPD, and ?TIA/CVA who presented to [facility] on 04/12/2012 with complaints of shortness of breath and was intubated for respiratory distress.   No prior cardiac history. Had a stress test "many years ago", but doesn't remember the results. Denies history of MI, CHF, rhythm irregularities, DM, or HLD. About 5 yrs ago had a visual disturbance and was told he may have had a TIA or stroke. States he had imaging done of his head and neck without significant findings. Stopped drinking alcohol about 33yrs ago. No illicit drugs. No history of abnormal bleeding. Normal TSH 03/2012. Is not very active, limited by shortness of breath. Does not wear home O2. Denies chest pain with exertion. Stable 4 pillow orthopnea for many years. No palpitations or syncope. Three days prior to presentation he started feeling poorly with worsening shortness of breath and productive cough. On day of presentation acutely worsened prompting presentation to the ED.   In the ED, EKG showed sinus rhythm with nonspecific ST/T changes. CXR showed mild cardiomegaly w/ diffuse interstitial opacity, edema vs atypical PNA, and mild bilat hilar prominence. Labs were significant for pBNP 17911, normal troponin, BUN/Crt 47/1.84, glucose 169, Hgb 20.1. He was hypotensive requiring IVF and eventually vasopresors. He became agitated, confused, and desaturated and therefore he was intubated. Aspirated during intubation. Tested positive for influenza. Spike fever 1/13 with suspected IJ catheter infection. Has been treated with Tamiflu and abx. Renal function has improved. Cardiac enzymes  normal x4. BLE dopplers were without evidence of DVT. Echo showed mod LVH, normal LV fxn, EF 50-55%,no WMAs, mod dilated RA/RV. A1c 6.7 (? Falsely elevated in setting of polycythemia). Extubated 1/15. Converted to A.fib w/ rates 120-150s around 2200 on 1/15. He was hemodynamically stable and asymptomatic. He was treated with metoprolol and placed on a Cardizem drip with spontaneous conversion to NSR around noon on 1/16.     Past Medical History  Diagnosis Date  . Stroke   . Hypertension   . Hyperlipidemia   . GERD (gastroesophageal reflux disease)   . COPD (chronic obstructive pulmonary disease)       Surgical History: History reviewed. No pertinent past surgical history.   Home Meds: Medication Sig  amLODipine (NORVASC) 5 MG tablet Take 1 tablet (5 mg total) by mouth daily.  aspirin 81 MG tablet Take 81 mg by mouth daily.    budesonide-formoterol (SYMBICORT) 160-4.5 MCG/ACT inhaler Inhale 2 puffs into the lungs 2 (two) times daily.  hydrochlorothiazide (HYDRODIURIL) 25 MG tablet Take 1 tablet (25 mg total) by mouth daily.  Ibuprofen-Diphenhydramine HCl (ADVIL PM) 200-25 MG CAPS Take 1 capsule by mouth at bedtime as needed. For sleep  lisinopril (PRINIVIL,ZESTRIL) 40 MG tablet Take 1 tablet (40 mg total) by mouth daily.    Inpatient Medications:   . antiseptic oral rinse  15 mL Mouth Rinse q12n4p  . chlorhexidine  15 mL Mouth Rinse BID  . diltiazem  60 mg Oral Q6H  . feeding supplement  237 mL Oral BID BM  . heparin  5,000 Units Subcutaneous Q8H  . insulin aspart  2-6 Units Subcutaneous Q4H  . ipratropium  0.5 mg Nebulization Q6H  . levalbuterol  0.63 mg  Nebulization Q6H  . predniSONE  40 mg Oral Q breakfast  . vancomycin  1,000 mg Intravenous Q12H   . dextrose    . diltiazem (CARDIZEM) infusion      Allergies: No Known Allergies  History   Social History  . Marital Status: Single    Spouse Name: N/A    Number of Children: N/A  . Years of Education: N/A    Occupational History  . Not on file.   Social History Main Topics  . Smoking status: Former Smoker -- 0.5 packs/day    Types: Cigarettes  . Smokeless tobacco: Never Used  . Alcohol Use: No  . Drug Use: No  . Sexually Active: Not on file   Other Topics Concern  . Not on file   Social History Narrative  . No narrative on file     Family History  Problem Relation Age of Onset  . COPD Mother      Review of Systems: General: negative for chills, fever, night sweats or weight changes.  Cardiovascular: As per HPI Dermatological: negative for rash Respiratory: As per HPI Urologic: negative for hematuria Abdominal: negative for nausea, vomiting, diarrhea, bright red blood per rectum, melena, or hematemesis Neurologic: negative for visual changes, syncope, or dizziness All other systems reviewed and are otherwise negative except as noted above.  Labs:  Component Value Date   WBC 13.6* 04/23/2012   HGB 19.1* 04/23/2012   HCT 59.7* 04/23/2012   MCV 98.4 04/23/2012   PLT 153 04/23/2012    Lab 04/23/12 0400  NA 154*  K 3.4*  CL 106  CO2 39*  BUN 43*  CREATININE 0.58  CALCIUM 9.6  PROT 6.6  BILITOT 0.9  ALKPHOS 68  ALT 24  AST 25  GLUCOSE 136*    04/12/2012 19:32 04/13/2012 01:55 04/13/2012 04:07 04/23/2012 12:00  Troponin I <0.30 <0.30 <0.30 <0.30     04/12/2012 18:32 04/19/2012 12:28  Pro B Natriuretic peptide (BNP) 17911.0 (H) 398.2 (H)     04/13/2012 04:07  Hemoglobin A1C 6.7 (H)     Radiology/Studies:   04/23/2012 -  PORTABLE CHEST - 1 VIEW   Findings: Right internal jugular line unchanged.  Extubation and removal of nasogastric tube.  Mild cardiomegaly with tortuous descending thoracic aorta. Possible right paratracheal soft tissue fullness, which could be related to great vessels.  No pleural effusion or pneumothorax.  Mild hyperinflation.  Lower lobe predominant interstitial thickening.  Mild scarring or atelectasis at the left lung base. No congestive failure.   IMPRESSION:  1.  Extubation and removal of nasogastric tube. 2.  Cardiomegaly and COPD, without acute disease. 3.  Apparent right paratracheal soft tissue fullness which is similar back to the initial exam of 04/12/2012. Although this could represent great vessels, underlying adenopathy cannot be excluded. If there are more remote radiographs for comparison, they should be reviewed.  Alternatively, further characterization with PA and lateral radiographs or contrast enhanced chest CT should be considered.     04/12/2012 - PORTABLE CHEST - 1 VIEW Findings: Diffuse interstitial opacity noted with indistinct pulmonary vasculature.  Mild cardiomegaly is observed.  Mild bilateral hilar prominence noted.  IMPRESSION:  1.  Mild cardiomegaly with diffuse interstitial opacity, potentially representing acute pulmonary edema or atypical pneumonia. 2.  Mild bilateral hilar prominence.  Below this may be vascular, I cannot exclude hilar adenopathy.      04/13/12 - Echo Study Conclusions: - Left ventricle: The cavity size was normal. Wall thickness was increased in a pattern  of moderate LVH. Systolic function was normal. The estimated ejection fraction was in the range of 50% to 55%. Wall motion was normal; there were no regional wall motion abnormalities. Left ventricular diastolic function parameters were normal. - Right ventricle: The cavity size was moderately dilated. - Right atrium: The atrium was moderately dilated. - Atrial septum: No defect or patent foramen ovale was identified.   EKG: 04/13/12 - NSR 88bpm, RAE, nonspecific ST/T changes III, aVF, V1-3  04/23/12 - A.Fib 132bpm  Tele: A.fib --> NSR  Physical Exam: Blood pressure 131/83, pulse 79, temperature 97 F (36.1 C), temperature source Oral, resp. rate 25, height 5\' 8"  (1.727 m), weight 218 lb 7.6 oz (99.1 kg), SpO2 93.00%. General: Elderly white male in no acute distress. Head: Normocephalic, atraumatic, sclera non-icteric, no xanthomas, nares are  without discharge.  Neck: Supple. Negative for carotid bruits or JVD Lungs: Coarse breath sounds. Bibasilar rales. Breathing is unlabored on supplemental O2. Heart: RRR with S1 S2. No murmurs, rubs, or gallops appreciated. Abdomen: Soft, non-tender, non-distended with normoactive bowel sounds. No rebound/guarding. No obvious abdominal masses. Msk:  Strength and tone appear normal for age. Extremities: No clubbing or cyanosis. Trace BLE ankle edema.  Distal pedal pulses are intact and equal bilaterally. Neuro: Alert and oriented X 3. Moves all extremities spontaneously. Psych:  Responds to questions appropriately with a normal affect.   Assessment and Plan:   62 y.o. male w/ PMHx significant for HTN, tobacco abuse, COPD, and ?TIA/CVA who presented to [facility] on 04/12/2012 with complaints of shortness of breath and was intubated for respiratory distress.   1. Acute Respiratory failure 2. Influenza  3. Aspiration PNA 4. Nosocomial Infection - IJ catheter 5. Atrial Fibrillation w/ RVR, newly diagnosed this admission, now in NSR 6. ? Diabetes Mellitus, type 2, newly diagnosed this admission, A1c 6.7 in the setting of polycythemia  7. ? Right Paratracheal adenopathy 8. Hypokalemia 9. Hypernatremia 10. COPD w/ ongoing Tobacco Abuse 11. Hypertension  Patient initially presented with respiratory distress requiring intubation and found to have influenza and aspiration PNA. He is now extubated. Developed a.fib w/ rvr last night with spontaneous conversion to NSR on cardizem drip. He remains in NSR now and is being transitioned to oral cardizem. No prior documented history of a.fib, but he is asymptomatic so there is concern he may have had PAF which led to his TIA/CVA 35yrs ago. Also no h/o of DM, but A1c 6.7. CBGs elevated on steroids. Unsure if A1c can be falsely elevated in the setting of polycythemia. CHADS2 score ?3-4 (HTN, CVA, ?DM). Needs anticoagulation for stroke prevention. No h/o  bleeding. Will start coumadin with plans to follow INR with PCP or in our coumadin clinic. Would continue oral cardizem and consolidate prior to dc.    Signed, HOPE, JESSICA PA-C 04/23/2012, 12:44 PM   Patient seen and examined.  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 Has been treated for pneumonia/flu Yesterday developed afib.  Converted spontaneously to SR  On exam  Patient is in SR.  Volume status is not bad, may be increased mildly. I would recomm continued Rx with PO cardiazem.  Keep on qid dosing for now. With history of CVA in past I think the patient should be on coumadin.  Follow CBC.  Dietrich Pates

## 2012-04-24 ENCOUNTER — Encounter (HOSPITAL_COMMUNITY): Payer: Self-pay

## 2012-04-24 LAB — CATH TIP CULTURE

## 2012-04-24 LAB — CBC
Hemoglobin: 19.8 g/dL — ABNORMAL HIGH (ref 13.0–17.0)
MCH: 30.4 pg (ref 26.0–34.0)
MCHC: 32.5 g/dL (ref 30.0–36.0)
MCV: 93.4 fL (ref 78.0–100.0)
RBC: 6.52 MIL/uL — ABNORMAL HIGH (ref 4.22–5.81)

## 2012-04-24 LAB — COMPREHENSIVE METABOLIC PANEL
ALT: 25 U/L (ref 0–53)
CO2: 28 mEq/L (ref 19–32)
Calcium: 9.6 mg/dL (ref 8.4–10.5)
Creatinine, Ser: 0.65 mg/dL (ref 0.50–1.35)
GFR calc Af Amer: >90 mL/min (ref >90)
GFR calc non Af Amer: >90 mL/min (ref >90)
Glucose, Bld: 139 mg/dL — ABNORMAL HIGH (ref 70–99)
Sodium: 139 mEq/L (ref 135–145)
Total Protein: 6.9 g/dL (ref 6.0–8.3)

## 2012-04-24 LAB — PHOSPHORUS: Phosphorus: 2.7 mg/dL (ref 2.3–4.6)

## 2012-04-24 LAB — CBC WITH DIFFERENTIAL/PLATELET
Basophils Absolute: 0 10*3/uL (ref 0.0–0.1)
Eosinophils Absolute: 0 10*3/uL (ref 0.0–0.7)
HCT: 60.6 % — ABNORMAL HIGH (ref 39.0–52.0)
Lymphocytes Relative: 14 % (ref 12–46)
Lymphs Abs: 2.1 10*3/uL (ref 0.7–4.0)
MCHC: 31.8 g/dL (ref 30.0–36.0)
Neutro Abs: 11.3 10*3/uL — ABNORMAL HIGH (ref 1.7–7.7)
RDW: 17 % — ABNORMAL HIGH (ref 11.5–15.5)

## 2012-04-24 LAB — MAGNESIUM: Magnesium: 1.9 mg/dL (ref 1.5–2.5)

## 2012-04-24 LAB — GLUCOSE, CAPILLARY
Glucose-Capillary: 140 mg/dL — ABNORMAL HIGH (ref 70–99)
Glucose-Capillary: 172 mg/dL — ABNORMAL HIGH (ref 70–99)

## 2012-04-24 LAB — HEMOGLOBIN A1C: Mean Plasma Glucose: 157 mg/dL — ABNORMAL HIGH (ref <117)

## 2012-04-24 LAB — TROPONIN I: Troponin I: <0.3 ng/mL (ref <0.30)

## 2012-04-24 MED ORDER — WARFARIN SODIUM 7.5 MG PO TABS
7.5000 mg | ORAL_TABLET | Freq: Once | ORAL | Status: AC
  Administered 2012-04-24: 7.5 mg via ORAL
  Filled 2012-04-24: qty 1

## 2012-04-24 MED ORDER — POTASSIUM CHLORIDE CRYS ER 20 MEQ PO TBCR
40.0000 meq | EXTENDED_RELEASE_TABLET | Freq: Three times a day (TID) | ORAL | Status: AC
  Administered 2012-04-24 (×2): 40 meq via ORAL
  Filled 2012-04-24 (×2): qty 2

## 2012-04-24 MED ORDER — LORAZEPAM 2 MG/ML IJ SOLN
0.5000 mg | Freq: Once | INTRAMUSCULAR | Status: AC
  Administered 2012-04-24: 0.5 mg via INTRAVENOUS
  Filled 2012-04-24: qty 1

## 2012-04-24 MED ORDER — SODIUM CHLORIDE 0.9 % IV BOLUS (SEPSIS)
250.0000 mL | Freq: Once | INTRAVENOUS | Status: AC
  Administered 2012-04-24: 250 mL via INTRAVENOUS

## 2012-04-24 MED ORDER — FUROSEMIDE 10 MG/ML IJ SOLN
40.0000 mg | Freq: Three times a day (TID) | INTRAMUSCULAR | Status: AC
  Administered 2012-04-24 (×2): 40 mg via INTRAVENOUS
  Filled 2012-04-24 (×2): qty 4

## 2012-04-24 NOTE — Progress Notes (Signed)
ANTICOAGULATION CONSULT NOTE - Follow Up Consult  Pharmacy Consult for Coumadin Indication: atrial fibrillation  No Known Allergies  Patient Measurements: Height: 5\' 8"  (172.7 cm) Weight: 209 lb 12.8 oz (95.165 kg) IBW/kg (Calculated) : 68.4   Vital Signs: Temp: 97.8 F (36.6 C) (01/17 0816) Temp src: Oral (01/17 0816) BP: 150/97 mmHg (01/17 0816) Pulse Rate: 81  (01/17 0800)  Labs:  Basename 04/24/12 0530 04/23/12 2003 04/23/12 1200 04/23/12 0400 04/22/12 0358  HGB 19.3* -- -- 19.1* --  HCT 60.6* -- -- 59.7* 55.0*  PLT 147* -- -- 153 152  APTT -- -- -- -- --  LABPROT 12.8 12.1 -- -- --  INR 0.97 0.90 -- -- --  HEPARINUNFRC -- -- -- -- --  CREATININE -- -- -- 0.58 0.60  CKTOTAL -- -- -- -- --  CKMB -- -- -- -- --  TROPONINI -- -- <0.30 -- --    Estimated Creatinine Clearance: 105.7 ml/min (by C-G formula based on Cr of 0.58).   Medications:  Scheduled:     . antiseptic oral rinse  15 mL Mouth Rinse q12n4p  . chlorhexidine  15 mL Mouth Rinse BID  . diltiazem  60 mg Oral Q6H  . feeding supplement  237 mL Oral BID BM  . furosemide  40 mg Intravenous Q8H  . heparin  5,000 Units Subcutaneous Q8H  . insulin aspart  0-9 Units Subcutaneous TID WC  . ipratropium  0.5 mg Nebulization Q6H  . levalbuterol  0.63 mg Nebulization Q6H  . potassium chloride  40 mEq Oral TID  . predniSONE  40 mg Oral Q breakfast  . vancomycin  1,000 mg Intravenous Q12H  . [COMPLETED] warfarin  5 mg Oral ONCE-1800  . Warfarin - Pharmacist Dosing Inpatient   Does not apply q1800  . [DISCONTINUED] albuterol  2.5 mg Nebulization Q6H  . [DISCONTINUED] insulin aspart  2-6 Units Subcutaneous Q4H  . [DISCONTINUED] insulin glargine  5 Units Subcutaneous Daily  . [DISCONTINUED] methylPREDNISolone sodium succinate  40 mg Intravenous Q12H  . [DISCONTINUED] predniSONE  40 mg Oral Q breakfast    Assessment: 62 year old male with atrial fibrillation now new start Coumadin on 1/16. INR 0.97. CBC stable  during this hospital admit. Also on heparin sq TID. Patient has been critically ill as well as on multiple antibiotics.   Goal of Therapy:  INR 2-3   Plan:  Coumadin 7.5mg  po x 1 dose today  F/u PT/INR  Thank you,  Brett Fairy, PharmD, BCPS 04/24/2012 10:04 AM

## 2012-04-24 NOTE — Evaluation (Signed)
Occupational Therapy Evaluation Patient Details Name: Cody [name] MRN: [id number] DOB: [birth year] Today's Date: 04/24/2012 Time: 7829-5621 OT Time Calculation (min): 25 min  OT Assessment / Plan / Recommendation Clinical Impression  Pt admitted with 4 day hx of SOB.  ETT 04/12/12- 04/22/12.  Pt generally weak and demonstrating decreased safety awareness during eval. Recommending HHOT and 24/7 supervision/assist but may need to update d/c plan for SNF pending pt progress.      OT Assessment  Patient needs continued OT Services    Follow Up Recommendations  Home health OT;Supervision/Assistance - 24 hour    Barriers to Discharge None    Equipment Recommendations   (TBD)    Recommendations for Other Services    Frequency  Min 2X/week    Precautions / Restrictions Precautions Precautions: Fall   Pertinent Vitals/Pain O2 sats fluctuating 85-90% on 3L sitting in chair.  RR 15-23 while sitting in chair.    ADL  Grooming: Performed;Wash/dry face;Wash/dry hands;Set up Where Assessed - Grooming: Unsupported sitting Upper Body Bathing: Simulated;Set up Where Assessed - Upper Body Bathing: Unsupported sitting Lower Body Bathing: Simulated;Minimal assistance Where Assessed - Lower Body Bathing: Unsupported sitting Upper Body Dressing: Simulated;Set up Where Assessed - Upper Body Dressing: Unsupported sitting Lower Body Dressing: Simulated;Minimal assistance Where Assessed - Lower Body Dressing: Unsupported sitting Toilet Transfer: Simulated;Moderate assistance Toilet Transfer Method: Sit to Barista:  (chair) Equipment Used:  (none) Transfers/Ambulation Related to ADLs: Pt stood (impulsively) from chair with superivison but required mod assist to control descent to chair. ADL Comments: Pt in chair upon OT arrival and reporting he did not want to get out of chair and ambulate in room. While performing eval at chair level, pt suddenly reporting he needed to  urinate and was going to walk to the bathroom. Requested pt to remain seated while therapist managed lines and had second person retrieve portable telemetry box.  Pt immediately attempted to [facility] telemetry box (which he reports he has been doing to get RN attention) and stand from chair.  Pt stood abruptly but immediately began to lose balance and required mod assist to control descent to chair.  Pt reporting he could get up and walk if staff would just let him. Educated on need for assistance due to multiple lines and leads.  Pt then used urinal sitting EOC.   OT Diagnosis: Generalized weakness;Cognitive deficits  OT Problem List: Decreased strength;Impaired balance (sitting and/or standing);Decreased safety awareness;Decreased knowledge of use of DME or AE;Cardiopulmonary status limiting activity OT Treatment Interventions: Self-care/ADL training;DME and/or AE instruction;Therapeutic activities;Cognitive remediation/compensation;Patient/family education;Balance training   OT Goals Acute Rehab OT Goals OT Goal Formulation: With patient Time For Goal Achievement: 05/08/12 Potential to Achieve Goals: Good ADL Goals Pt Will Perform Grooming: with modified independence;Standing at sink ADL Goal: Grooming - Progress: Goal set today Pt Will Perform Lower Body Bathing: with modified independence;Sit to stand from chair;Sit to stand from bed;with adaptive equipment ADL Goal: Lower Body Bathing - Progress: Goal set today Pt Will Perform Lower Body Dressing: with modified independence;Sit to stand from chair;Sit to stand from bed;with adaptive equipment ADL Goal: Lower Body Dressing - Progress: Goal set today Pt Will Transfer to Toilet: with modified independence;Ambulation;with DME;Comfort height toilet ADL Goal: Toilet Transfer - Progress: Goal set today Pt Will Perform Toileting - Clothing Manipulation: with modified independence;Standing ADL Goal: Toileting - Clothing Manipulation - Progress:  Goal set today Pt Will Perform Tub/Shower Transfer: Shower transfer;with modified independence;Ambulation;with DME;Shower seat with back ADL Goal:  Tub/Shower Transfer - Progress: Goal set today Miscellaneous OT Goals Miscellaneous OT Goal #1: Pt will independently verbalize 3 energy conservation techniques to be used during ADLs. OT Goal: Miscellaneous Goal #1 - Progress: Goal set today  Visit Information  Last OT Received On: 04/24/12 Assistance Needed: +2    Subjective Data      Prior Functioning     Home Living Lives With: Spouse (3 daughters) Available Help at Discharge: Family;Available 24 hours/day Type of Home: Mobile home Home Access: Stairs to enter Entrance Stairs-Number of Steps: 3 Entrance Stairs-Rails: Right;Left Home Layout: One level Bathroom Shower/Tub: Health visitor: Standard Bathroom Accessibility: Yes How Accessible: Accessible via walker Home Adaptive Equipment: Built-in shower seat Prior Function Level of Independence: Independent Able to Take Stairs?: Yes Driving: Yes Vocation: Full time employment Communication Communication: No difficulties         Vision/Perception     Cognition  Overall Cognitive Status: Impaired Area of Impairment: Safety/judgement Arousal/Alertness: Awake/alert Orientation Level: Appears intact for tasks assessed Behavior During Session: Other (comment) (frustrated (due to need to call for assistance for transfers) Safety/Judgement: Decreased awareness of safety precautions;Decreased safety judgement for tasks assessed;Impulsive;Decreased awareness of need for assistance Safety/Judgement - Other Comments: Pt attempting to stand and ambulate from chair while attached to O2 on wall, telemetry box and IV pole which was plugged into wall.    Extremity/Trunk Assessment Right Upper Extremity Assessment RUE ROM/Strength/Tone: [facility] for tasks assessed Left Upper Extremity Assessment LUE ROM/Strength/Tone:  WFL for tasks assessed     Mobility Bed Mobility Bed Mobility: Not assessed (pt up in chair) Transfers Transfers: Sit to Stand;Stand to Sit Sit to Stand: 5: Supervision;From chair/3-in-1;With armrests;With upper extremity assist Stand to Sit: 3: Mod assist;To chair/3-in-1;With armrests;With upper extremity assist Details for Transfer Assistance: Pt stood impulsively from chair without assist from therapist but immediately unsteady upon standing and required physical assist to control descent to chair.     Shoulder Instructions     Exercise     Balance Balance Balance Assessed: Yes Static Sitting Balance Static Sitting - Balance Support: Feet supported;No upper extremity supported Static Sitting - Level of Assistance: 5: Stand by assistance Static Sitting - Comment/# of Minutes: sat EOC while using urinal Static Standing Balance Static Standing - Balance Support: No upper extremity supported Static Standing - Level of Assistance: 3: Mod assist Static Standing - Comment/# of Minutes: <10 seconds   End of Session OT - End of Session Equipment Utilized During Treatment:  (none) Activity Tolerance: Patient limited by fatigue Patient left: in chair;with call bell/phone within reach;with nursing in room;with family/visitor present Nurse Communication: Mobility status  GO    04/24/2012 Cipriano Mile OTR/L Pager [phone] Office [phone]  Cipriano Mile 04/24/2012, 2:49 PM

## 2012-04-24 NOTE — Progress Notes (Signed)
RT attempted to stick pt once in right radial artery.  After unsuccessful attempt, patient refused second attempt.  Elink notified.

## 2012-04-24 NOTE — Progress Notes (Signed)
PT Cancellation Note  Patient Details Name: RAEQUON CATANZARO MRN: [id number] DOB: 1951-03-03   Cancelled Treatment:    Reason Eval/Treat Not Completed: Fatigue/lethargy limiting ability to participate.  Patient reports up at sink earlier to brush teeth and too tired now to participate in standing or gait activities.   WYNN,CYNDI 04/24/2012, 2:16 PM Sheran Lawless, [place] [phone] 04/24/2012

## 2012-04-24 NOTE — Significant Event (Signed)
Patient confused, attempting to get out of bed, removed telemetry monitor and refusing care. Patient adamant about getting dressed and leaving the hospital Verbal attempts to reorient patient unsuccessful due to patient agitation. Care of patient transferred to another staff member due to patient being agitated and upset with currently assigned staff. Dr Frederico Hamman made aware. New orders obtained.

## 2012-04-24 NOTE — Progress Notes (Signed)
Speech Language Pathology Discharge Patient Details Name: Cody [name] MRN: [id number] DOB: 20-Mar-1951 Today's Date: 04/24/2012 Time: 4098-1191 SLP Time Calculation (min): 8 min  Patient discharged from SLP services secondary to goals met and no further SLP needs identified.  Please see latest therapy progress note for current level of functioning and progress toward goals.    Progress and discharge plan discussed with patient and/or caregiver: Patient/Caregiver agrees with plan  GO     Royce Macadamia 04/24/2012, 10:42 AM

## 2012-04-24 NOTE — Progress Notes (Signed)
Speech Language Pathology Dysphagia Treatment Patient Details Name: Cody [name] MRN: [id number] DOB: 1950-07-28 Today's Date: 04/24/2012 Time: 4098-1191 SLP Time Calculation (min): 8 min  Assessment / Plan / Recommendation Clinical Impression  Pt. seen today to further determine oropharyngeal swallow function.  He complained of increased vocal hoarsness today due to the heat being turned up last night and reports throat is slightly more sore today.  Pt. masticated and manipulated graham cracker without oral or pharyngeal difficulty.  Pt. exhibited delayed cough x 2 after cracker perhaps due dry and crumbly texture of cracker.  Overall he appears safe to upgrade diet texture to regular and continue thin liquids with pills with liquids.  No further ST needed.     Diet Recommendation  Initiate / Change Diet: Regular;Thin liquid    SLP Plan All goals met       Swallowing Goals  SLP Swallowing Goals Patient will utilize recommended strategies during swallow to increase swallowing safety with: Supervision/safety;Moderate assistance;Moderate cueing Swallow Study Goal #2 - Progress: Met  General Temperature Spikes Noted: No Respiratory Status: Supplemental O2 delivered via (comment) Behavior/Cognition: Alert;Cooperative;Pleasant mood Oral Cavity - Dentition: Missing dentition Patient Positioning: Upright in chair  Oral Cavity - Oral Hygiene Does patient have any of the following "at risk" factors?: Oxygen therapy - cannula, mask, simple oxygen devices Brush patient's teeth BID with toothbrush (using toothpaste with fluoride): Yes Patient is HIGH RISK - Oral Care Protocol followed (see row info): Yes Patient is AT RISK - Oral Care Protocol followed (see row info): Yes   Dysphagia Treatment Treatment focused on: Skilled observation of diet tolerance Treatment Methods/Modalities: Skilled observation Patient observed directly with PO's: Yes Type of PO's observed: Regular;Thin  liquids Feeding: Able to feed self Liquids provided via: Cup;Straw Pharyngeal Phase Signs & Symptoms: Delayed cough Type of cueing: Verbal Amount of cueing: Minimal        Royce Macadamia M.Ed [employer] [phone]  04/24/2012

## 2012-04-24 NOTE — Significant Event (Signed)
Patient's heart rhythm converted to Afib with rapid ventricular response with rate 120's-160(confirmed with 12 lead EKG)  while patient was asleep. BP 146/94. Upon awakening, patient confused and noncooperative, refusing to wear oxygen mask and attempting to get out of bed. Attempts to calm, reassure and reorient patient to situation unsuccessful. Oxygen delivery method changed to 6 liter nasal cannula due to patient refusing to wear mask. Oxygen sats 87-92% on 6L. Patient adamant about not wanting to wear mask as well as refusing to allow administration SQ heparin. Haldol 5mg  IV administered per prn orders. Elink notified, awaiting return call from physician.

## 2012-04-24 NOTE — Progress Notes (Signed)
PULMONARY  / CRITICAL CARE MEDICINE  Name: Cody [name] MRN: [id number] DOB: September 27, 1950    LOS: 12  REFERRING MD:   EDP - Dr. Preston Fleeting  CHIEF COMPLAINT:  Shortness of breath  BRIEF PATIENT DESCRIPTION: 62 yo M with COPD and 3ppd smoking admitted 04/12/2012 with acute respiratory failure 2/2 influenza A and aspiration PNA, required intubated 04/13/11 for airway protection and aspirated during intubation.   LINES / TUBES: ETT 1/5 >>1/15  L IJ 1/5 >> 1/13 R IJ 1/13 >>  R A-line 1/5 >>1/8 pulled out by pt   CULTURES: Blood culture 1/5>> Negative Resp culture 1/6>> H. influenzae  Resp viral panel 1/6>> Influenza A and Metapneumovirus  Sputum Leigonella Culture 1/6>> Negative Bronch Sputum 1/6>> H. influenzae  Urine 1/5>> Neg  Urine 1/6>> Neg  Flu 1/5>> Neg Blood culture 01/13 >> Catheter tip culture 1/13 >> C.diff PCR 1/14 >> Negative  ANTIBIOTICS: Zosyn 1/5>>1/9  Azithro 1/5>>1/8  Vanc 1/6>>>1/8 - then restarted 01/13 >>> Tamiflu 1/6>>>1/16  Rocephin 1/9>>1/15   SIGNIFICANT EVENTS:  1/5 Intubated in ED  1/5 Shock , levophed  1/6 ECHO - 55%, mod dil rv, PA pressures>>> not measured b/c no TR jet  1/6 Bronched- diffuse pus all lobes  1/7 increasing pCO2, increased RR on vent  1/8 Changed sedation to Precedex, more agitated than with versed  1/9 Changed sedation back to Versed, agitation greatly improved  1/10 scheduled haldol  1/12 Restarting precedex gtt due to persistent agitation 1/13 Febrile to 102.6 overnight. ? Catheter infection? DC'ed L IJ, placed new R IJ. 1/14 No fevers overnight, agitation controlled on propofol (added 1/13), more interactive. 1/15 Had foul smelling stools overnight, therefore, C.diff PCR was sent by RN. 1/15 EXTUBATED, developed A.fib with RVR overnight. 1/16 Persistent episodes of desaturation.  LEVEL OF CARE:  ICU PRIMARY SERVICE:  PCCM CONSULTANTS:  None CODE STATUS: Full DIET:  Tube feeds DVT Px:  Heparin GI Px:  No longer on  protonix (on board when intubated)   SUBJECTIVE / INTERVAL HISTORY:  Patient indicates he is feeling better, appreciates being off of the vent. His sore throat is improving. He continues to have some shortness of breath, that is significantly improved from prior. He states at baseline he has tachycardia, is unaware of history of A.fib  Interval Events: - Had A.fib with RVR with rates to 150s on 1/15, was given Metoprolol, HR remained elevated --> started on diltiazem gtt.   VITAL SIGNS: Temp:  [97.3 F (36.3 C)-98.2 F (36.8 C)] 97.8 F (36.6 C) (01/17 0816) Pulse Rate:  [61-92] 81  (01/17 0800) Resp:  [18-30] 20  (01/17 0800) BP: (122-158)/(81-97) 150/97 mmHg (01/17 0816) SpO2:  [88 %-99 %] 92 % (01/17 0801) FiO2 (%):  [50 %] 50 % (01/16 1100) Weight:  [95.165 kg (209 lb 12.8 oz)] 95.165 kg (209 lb 12.8 oz) (01/17 0500) HEMODYNAMICS: VENTILATOR SETTINGS: Vent Mode:  [-]  FiO2 (%):  [50 %] 50 % INTAKE / OUTPUT: Intake/Output      01/16 0701 - 01/17 0700 01/17 0701 - 01/18 0700   P.O. 990    I.V. (mL/kg) 1125 (11.8) 50 (0.5)   NG/GT     IV Piggyback 450    Total Intake(mL/kg) 2565 (27) 50 (0.5)   Urine (mL/kg/hr) 1140 (0.5) 200   Stool 200    Total Output 1340 200   Net +1225 -150        Urine Occurrence 4 x      PHYSICAL EXAMINATION: General:  Sitting upright in his chair. Comfortable, no acute distress.   Head: Normocephalic, atraumatic.  Lungs:  Normal respiratory effort. Basilar crackles. No wheezing.  Heart: Irregularly irregular rhythm, tachycardic.  Abdomen:  BS normoactive. Soft, Nondistended, non-tender.  No masses or organomegaly.  Extremities: No pretibial edema.     IMAGING: 1/13 - CXR - 1. Right IJ line tip: SVC. No pneumothorax. Otherwise essentially stable. 1/15 - CXR - Stable fibrotic lung disease and slightly improved right lung aeration.   MEDICATIONS: Infusions    . dextrose 50 mL/hr at 04/24/12 0800  . diltiazem (CARDIZEM) infusion      Scheduled    . antiseptic oral rinse  15 mL Mouth Rinse q12n4p  . chlorhexidine  15 mL Mouth Rinse BID  . diltiazem  60 mg Oral Q6H  . feeding supplement  237 mL Oral BID BM  . heparin  5,000 Units Subcutaneous Q8H  . insulin aspart  0-9 Units Subcutaneous TID WC  . ipratropium  0.5 mg Nebulization Q6H  . levalbuterol  0.63 mg Nebulization Q6H  . predniSONE  40 mg Oral Q breakfast  . vancomycin  1,000 mg Intravenous Q12H  . Warfarin - Pharmacist Dosing Inpatient   Does not apply q1800    DIAGNOSES: Principal Problem:  *Acute respiratory failure with hypercapnia Active Problems:  HYPERLIPIDEMIA  TOBACCO USE  HYPERTENSION  GERD  COPD (chronic obstructive pulmonary disease)  Acute encephalopathy  Acute kidney injury   ASSESSMENT / PLAN:  PULMONARY  Lab 04/22/12 1307 04/20/12 0403 04/18/12 0240  PHART 7.430 7.353 7.337*  PCO2ART 65.5* 74.5* 84.2*  PO2ART 55.0* 68.0* 92.9  HCO3 43.3* 41.1* 43.9*  TCO2 45 43 46.5   A: 1) Acute hypercapnic respiratory failure/ARDS - secondary to flu, COPD exacerbation. Requiring increased FiO2 /PEEP 1/13 likely contributed by agitation. Extubated 1/15. 2) Aspiration - at time of intubation, finished course of treatment  3) Pneumonitis vs. CAP - b/l interstitial opacities on cxr, slightly increased at left, likely atelectasis. Completed 8/8 days rocephin  4) Pulmonary edema - worsening per CXR on 1/12 --> Lasix. 4) Non-oxygen dependent COPD - h/o chronic tobacco abuse hx 3 pack per day 20+ years. On symbicort at home. Not on home O2 therapy. 5) Bloody secretions from aspiration - Pulm /renal syndrome ruled out.   P:  - Duonebs q6h + q2h PRN. - Changed to oral steroids Prednisone 40 mg daily, will continue for now. - O2 sat goal > 88%. - Smoking cessation education will be needed. - Further diureses today.  CARDIOVASCULAR  Lab 04/23/12 1200  CKTOTAL --  CKMB --  TROPONINI <0.30    Lab 04/19/12 1228  PROBNP 398.2*   A:  1)  New onset atrial fibrillation with RVR - likely 2/2 volume depletion, as pt was being diuresed as per ARDS protocol. Hypernatremia this AM in setting of lasix use further suggests this. Will need some low dose fluids. His CHADS score is 3, therefore, may require anticoagulation. May be a candidate for cardioversion given < 48h since onset. 2) Elevated proBNP - ProBNP on admission 17911 --> 398 on 1/12. Trop x3 negative. ECHO with normal systolic and diastolic dysfunction, EF 50-55%.  3) HTN -  BPs remain elevated. On home regimen Lisinopril 40mg , Norvasc 5mg , and HCTZ 25mg  qd.  4) Hypotension - Resolved. Initially contributed by septic shock likely, previously requiring pressors. Resolved. Now hypertensive.  PLAN:  - Low dose Lasix due to desaturation, would like to hold to normalize Na but patient is desaturating,  will continue D5W and lasix which should result in additional elimination of Na. - Continue steroids --> change to oral-taper over 2 weeks - Increased amlodipine to 10mg , will hold increasing antiHTN while diuresing. - Consult [facility] for new onset A. Fib (likely in setting of volume depletion) - specific question is regarding anticoagulation versus candidacy for cardioversion given new onset. - Changed IV diltiazem to oral 60mg  q6h (2-3 hour overlap) with adequate response. - Goal HR < 120. - Continue low rate of IVF and recheck BMET.  RENAL  Lab 04/23/12 0400 04/22/12 0358 04/21/12 0500  NA 154* 146* 147*  K 3.4* 3.7 4.5  CL 106 102 102  CO2 39* 39* 39*  BUN 43* 46* 46*  CREATININE 0.58 0.60 0.64  GLUCOSE 136* 183* 164*   A:  1) Acute Kidney injury - Resolved. Previously likely pre-renal in setting of volume depletion. Previously urinary sediment clogging foley, improved.  2) Hypernatremia - worsening 2/2 volume depletion in setting of lasix usage.  P:  - Change fluids to D5W. - Continue to monitor renal function. - Two doses of lasix, replace K and f/u BMET in AM  (none done on 1/17).  GASTROINTESTINAL  Lab 04/23/12 0400 04/22/12 0358  AST 25 19  ALT 24 21  ALKPHOS 68 59  BILITOT 0.9 0.4  PROT 6.6 6.1  ALBUMIN 2.8* 2.4*   A:  1) GERD 2) Diarrhea - resolving. C. Diff negative.  P: - Swallow study passed --> start dysphagia diet per recs.  HEMATOLOGIC  Lab 04/24/12 0530 04/23/12 2003 04/23/12 0400 04/22/12 0358  WBC 14.9* -- 13.6* 11.8*  HGB 19.3* -- 19.1* 16.9  HCT 60.6* -- 59.7* 55.0*  PLT 147* -- 153 152  APTT -- -- -- --  INR 0.97 0.90 -- --    A:  1) Polycythemia- Hgb stable.  2) Leukocytosis - slight increase since yesterday, although remains afebrile. Vanc and DC of left IJ on 1/13 ( for presumed L IJ catheter infection). He remains on steroids.   P:  - Am CBC  - Sub q hep. - See ID.  INFECTIOUS No results found for this basename: LATICACIDVEN:3,PROCALCITON:3 in the last 168 hours 1) Flu pneumonia - Flu A and metapneumovirus  2) Aspirated at time of intubation, pneumonitis vs PNA 3) Nosocomial infection - likely catheter infection - Left IJ (in 8 days) was dc'ed on 1/13 and catheter tip cultures. Was resumed Vanc on 1/13 in this setting as UA and CXR otherwise not indicating of infection. 4) Diarrhea - C.diff neg. 5) Leukocytosis - remains afebrile, ? Stress response in setting of new onset A.fib? No new symptoms, CXR stable, don't expect new infective source. But, will remove all extra lines.  P:  - D/C tamiflu - day 10/10. - Completed 8 day course of Rocephin. - Continue Vancomycin for possible catheter infection - day 5 of x, will continue for now until cultures are finalized. - F/U blood cultures and culture from line tip - pending (NGTD). - CBC with diff in AM.  ENDOCRINE  Lab 04/24/12 0815 04/23/12 2301 04/23/12 1623 04/23/12 1156 04/23/12 0828  GLUCAP 140* 158* 241* 153* 134*   A: 1) On steroids - CBGs at goal. Lantus increased on 1/14. P:  - Cont ICU Hyperglycemic protocol. - CBG monitoring qAC and  HS. - DC IV steroids --> oral steroids - will need to be tapered over 2 weeks. - Will DC Lantus, as was just on metformin at home and will be  taken off of IV steroids.    NEUROLOGIC / PSYCHIATRIC RAAS Score: N/A CAM-ICU:      A:  1) Acute encephalopathy - Resolved. Likely secondary to hypoxia / hypercarbia and possible infectious process (CAP?).  2) Delerium - Improving with Propofol + seroquel. No longer requiring precedex or versed.  P:  - Chair position as much as possible. - PRN Haldol will be d/ced, patient is alert and appropriate.  Alyson Reedy, M.D. [facility] Pulmonary/Critical Care Medicine. Pager: [phone]. After hours pager: [phone].

## 2012-04-25 ENCOUNTER — Inpatient Hospital Stay (HOSPITAL_COMMUNITY): Payer: Medicaid Other

## 2012-04-25 LAB — CBC
HCT: 56.2 % — ABNORMAL HIGH (ref 39.0–52.0)
Hemoglobin: 19.4 g/dL — ABNORMAL HIGH (ref 13.0–17.0)
MCHC: 34.5 g/dL (ref 30.0–36.0)
MCV: 94.3 fL (ref 78.0–100.0)
RDW: 16.6 % — ABNORMAL HIGH (ref 11.5–15.5)

## 2012-04-25 LAB — GLUCOSE, CAPILLARY
Glucose-Capillary: 136 mg/dL — ABNORMAL HIGH (ref 70–99)
Glucose-Capillary: 158 mg/dL — ABNORMAL HIGH (ref 70–99)
Glucose-Capillary: 163 mg/dL — ABNORMAL HIGH (ref 70–99)

## 2012-04-25 LAB — POCT I-STAT 3, ART BLOOD GAS (G3+)
Bicarbonate: 33.5 mEq/L — ABNORMAL HIGH (ref 20.0–24.0)
TCO2: 35 mmol/L (ref 0–100)
pCO2 arterial: 60.6 mmHg (ref 35.0–45.0)
pH, Arterial: 7.35 (ref 7.350–7.450)
pO2, Arterial: 130 mmHg — ABNORMAL HIGH (ref 80.0–100.0)

## 2012-04-25 LAB — BASIC METABOLIC PANEL
BUN: 33 mg/dL — ABNORMAL HIGH (ref 6–23)
Chloride: 99 mEq/L (ref 96–112)
Creatinine, Ser: 0.65 mg/dL (ref 0.50–1.35)
GFR calc Af Amer: >90 mL/min (ref >90)
GFR calc non Af Amer: >90 mL/min (ref >90)
Glucose, Bld: 136 mg/dL — ABNORMAL HIGH (ref 70–99)

## 2012-04-25 LAB — TROPONIN I
Troponin I: <0.3 ng/mL (ref <0.30)
Troponin I: <0.3 ng/mL (ref <0.30)

## 2012-04-25 MED ORDER — LIDOCAINE HCL (CARDIAC) 20 MG/ML IV SOLN
INTRAVENOUS | Status: AC
  Filled 2012-04-25: qty 5

## 2012-04-25 MED ORDER — PANTOPRAZOLE SODIUM 40 MG IV SOLR
40.0000 mg | Freq: Every day | INTRAVENOUS | Status: DC
  Administered 2012-04-25 – 2012-04-28 (×4): 40 mg via INTRAVENOUS
  Filled 2012-04-25 (×5): qty 40

## 2012-04-25 MED ORDER — PROPOFOL 10 MG/ML IV EMUL
5.0000 ug/kg/min | INTRAVENOUS | Status: DC
  Administered 2012-04-25 – 2012-04-27 (×8): 20 ug/kg/min via INTRAVENOUS
  Administered 2012-04-28: 40 ug/kg/min via INTRAVENOUS
  Administered 2012-04-28: 15 ug/kg/min via INTRAVENOUS
  Administered 2012-04-28: 40 ug/kg/min via INTRAVENOUS
  Administered 2012-04-28: 20 ug/kg/min via INTRAVENOUS
  Administered 2012-04-29 (×4): 40 ug/kg/min via INTRAVENOUS
  Administered 2012-04-29: 50 ug/kg/min via INTRAVENOUS
  Administered 2012-04-30 (×3): 40 ug/kg/min via INTRAVENOUS
  Administered 2012-05-01: 10 ug/kg/min via INTRAVENOUS
  Administered 2012-05-01 (×2): 40 ug/kg/min via INTRAVENOUS
  Administered 2012-05-02: 30 ug/kg/min via INTRAVENOUS
  Administered 2012-05-02: 20 ug/kg/min via INTRAVENOUS
  Administered 2012-05-02: 10 ug/kg/min via INTRAVENOUS
  Administered 2012-05-03: 15 ug/kg/min via INTRAVENOUS
  Administered 2012-05-03: 20 ug/kg/min via INTRAVENOUS
  Administered 2012-05-03: 15 ug/kg/min via INTRAVENOUS
  Administered 2012-05-03: 25 ug/kg/min via INTRAVENOUS
  Administered 2012-05-04 – 2012-05-05 (×4): 20 ug/kg/min via INTRAVENOUS
  Filled 2012-04-25 (×38): qty 100

## 2012-04-25 MED ORDER — HEPARIN BOLUS VIA INFUSION
4000.0000 [IU] | Freq: Once | INTRAVENOUS | Status: AC
  Administered 2012-04-25: 4000 [IU] via INTRAVENOUS
  Filled 2012-04-25: qty 4000

## 2012-04-25 MED ORDER — SODIUM CHLORIDE 0.9 % IV SOLN
25.0000 ug/h | INTRAVENOUS | Status: DC
  Administered 2012-04-26: 150 ug/h via INTRAVENOUS
  Administered 2012-04-27 – 2012-04-28 (×2): 100 ug/h via INTRAVENOUS
  Administered 2012-04-28 – 2012-05-04 (×12): 200 ug/h via INTRAVENOUS
  Filled 2012-04-25 (×18): qty 50

## 2012-04-25 MED ORDER — LORAZEPAM 2 MG/ML IJ SOLN
1.0000 mg | Freq: Once | INTRAMUSCULAR | Status: AC
  Administered 2012-04-25: 1 mg via INTRAVENOUS

## 2012-04-25 MED ORDER — MIDAZOLAM HCL 2 MG/2ML IJ SOLN
INTRAMUSCULAR | Status: AC
  Administered 2012-04-25: 4 mg
  Filled 2012-04-25: qty 4

## 2012-04-25 MED ORDER — CHLORHEXIDINE GLUCONATE 0.12 % MT SOLN
OROMUCOSAL | Status: AC
  Filled 2012-04-25: qty 15

## 2012-04-25 MED ORDER — FENTANYL CITRATE 0.05 MG/ML IJ SOLN
INTRAMUSCULAR | Status: AC
  Administered 2012-04-25: 100 ug via INTRAVENOUS
  Filled 2012-04-25: qty 4

## 2012-04-25 MED ORDER — HEPARIN (PORCINE) IN NACL 100-0.45 UNIT/ML-% IJ SOLN
1900.0000 [IU]/h | INTRAMUSCULAR | Status: DC
  Administered 2012-04-25 (×2): 1300 [IU]/h via INTRAVENOUS
  Administered 2012-04-26: 1500 [IU]/h via INTRAVENOUS
  Filled 2012-04-25 (×3): qty 250

## 2012-04-25 MED ORDER — CHLORHEXIDINE GLUCONATE 0.12 % MT SOLN
15.0000 mL | Freq: Two times a day (BID) | OROMUCOSAL | Status: DC
  Administered 2012-04-25 – 2012-05-29 (×64): 15 mL via OROMUCOSAL
  Filled 2012-04-25 (×63): qty 15

## 2012-04-25 MED ORDER — ETOMIDATE 2 MG/ML IV SOLN
INTRAVENOUS | Status: AC
  Administered 2012-04-25: 20 mg
  Filled 2012-04-25: qty 20

## 2012-04-25 MED ORDER — HALOPERIDOL LACTATE 5 MG/ML IJ SOLN: 5.0000 mg | Freq: Once | INTRAMUSCULAR | Status: DC

## 2012-04-25 MED ORDER — LORAZEPAM 2 MG/ML IJ SOLN
INTRAMUSCULAR | Status: AC
  Filled 2012-04-25: qty 1

## 2012-04-25 MED ORDER — FENTANYL BOLUS VIA INFUSION
25.0000 ug | Freq: Four times a day (QID) | INTRAVENOUS | Status: DC | PRN
  Administered 2012-04-30: 50 ug via INTRAVENOUS
  Administered 2012-04-30: 100 ug via INTRAVENOUS
  Administered 2012-04-30: 50 ug via INTRAVENOUS
  Administered 2012-05-05: 100 ug via INTRAVENOUS
  Filled 2012-04-25: qty 100

## 2012-04-25 MED ORDER — SODIUM CHLORIDE 0.9 % IV BOLUS (SEPSIS)
250.0000 mL | Freq: Once | INTRAVENOUS | Status: AC
  Administered 2012-04-25: 250 mL via INTRAVENOUS

## 2012-04-25 MED ORDER — JEVITY 1.2 CAL PO LIQD
1000.0000 mL | ORAL | Status: DC
  Administered 2012-04-25: 1000 mL
  Filled 2012-04-25 (×3): qty 1000

## 2012-04-25 MED ORDER — LORAZEPAM 2 MG/ML IJ SOLN
1.0000 mg | Freq: Once | INTRAMUSCULAR | Status: DC
  Filled 2012-04-25: qty 1

## 2012-04-25 MED ORDER — FENTANYL CITRATE 0.05 MG/ML IJ SOLN
INTRAMUSCULAR | Status: AC
  Administered 2012-04-25: 50 ug
  Filled 2012-04-25: qty 2

## 2012-04-25 MED ORDER — OXEPA PO LIQD
1000.0000 mL | ORAL | Status: DC
  Administered 2012-04-25 – 2012-04-26 (×2): 1000 mL
  Filled 2012-04-25 (×2): qty 1000

## 2012-04-25 MED ORDER — DEXTROSE 5 % IV SOLN
1.0000 g | Freq: Three times a day (TID) | INTRAVENOUS | Status: DC
  Administered 2012-04-25 – 2012-04-29 (×12): 1 g via INTRAVENOUS
  Filled 2012-04-25 (×15): qty 1

## 2012-04-25 MED ORDER — PRO-STAT SUGAR FREE PO LIQD
60.0000 mL | Freq: Four times a day (QID) | ORAL | Status: DC
  Administered 2012-04-25 – 2012-04-27 (×6): 60 mL
  Filled 2012-04-25 (×10): qty 60

## 2012-04-25 MED ORDER — SODIUM CHLORIDE 0.9 % IV SOLN
INTRAVENOUS | Status: DC | PRN
  Administered 2012-04-25: 20:00:00 via INTRAVENOUS

## 2012-04-25 MED ORDER — BIOTENE DRY MOUTH MT LIQD
15.0000 mL | Freq: Four times a day (QID) | OROMUCOSAL | Status: DC
  Administered 2012-04-25 – 2012-05-29 (×125): 15 mL via OROMUCOSAL

## 2012-04-25 MED ORDER — ROCURONIUM BROMIDE 50 MG/5ML IV SOLN
INTRAVENOUS | Status: AC
  Filled 2012-04-25: qty 2

## 2012-04-25 MED ORDER — SODIUM CHLORIDE 0.9 % IV SOLN: INTRAVENOUS | Status: DC | PRN

## 2012-04-25 MED ORDER — INSULIN ASPART 100 UNIT/ML ~~LOC~~ SOLN
0.0000 [IU] | SUBCUTANEOUS | Status: DC
  Administered 2012-04-25: 1 [IU] via SUBCUTANEOUS
  Administered 2012-04-25 – 2012-04-26 (×3): 2 [IU] via SUBCUTANEOUS
  Administered 2012-04-26: 1 [IU] via SUBCUTANEOUS
  Administered 2012-04-26 (×2): 2 [IU] via SUBCUTANEOUS
  Administered 2012-04-27: 1 [IU] via SUBCUTANEOUS
  Administered 2012-04-27 (×2): 2 [IU] via SUBCUTANEOUS
  Administered 2012-04-27: 1 [IU] via SUBCUTANEOUS
  Administered 2012-04-27: 3 [IU] via SUBCUTANEOUS
  Administered 2012-04-28: 2 [IU] via SUBCUTANEOUS
  Administered 2012-04-28: 1 [IU] via SUBCUTANEOUS
  Administered 2012-04-28 (×2): 2 [IU] via SUBCUTANEOUS
  Administered 2012-04-28: 1 [IU] via SUBCUTANEOUS
  Administered 2012-04-28 – 2012-04-29 (×3): 2 [IU] via SUBCUTANEOUS
  Administered 2012-04-30: 3 [IU] via SUBCUTANEOUS

## 2012-04-25 MED ORDER — SUCCINYLCHOLINE CHLORIDE 20 MG/ML IJ SOLN
INTRAMUSCULAR | Status: AC
  Filled 2012-04-25: qty 1

## 2012-04-25 NOTE — Procedures (Signed)
Central Venous Catheter Insertion Procedure Note Cody [name] [id number] 10/19/1950  Procedure: Insertion of Central Venous Catheter Indications: Drug and/or fluid administration  Procedure Details Consent: Risks of procedure as well as the alternatives and risks of each were explained to the (patient/caregiver).  Consent for procedure obtained. Time Out: Verified patient identification, verified procedure, site/side was marked, verified correct patient position, special equipment/implants available, medications/allergies/relevent history reviewed, required imaging and test results available.  Performed  Maximum sterile technique was used including antiseptics, cap, gloves, gown, hand hygiene, mask and sheet. Skin prep: Chlorhexidine; local anesthetic administered A antimicrobial bonded/coated triple lumen catheter was placed in the right internal jugular vein using the Seldinger technique.  Evaluation Blood flow good Complications: No apparent complications Patient did tolerate procedure well. Chest X-ray ordered to verify placement.  CXR: pending.  Cody V. 04/25/2012, 10:53 AM

## 2012-04-25 NOTE — Progress Notes (Signed)
PULMONARY  / CRITICAL CARE MEDICINE  Name: Cody [name] MRN: [id number] DOB: [birth year]    LOS: 13  REFERRING MD:   EDP - Dr. Preston Fleeting  CHIEF COMPLAINT:  Shortness of breath  BRIEF PATIENT DESCRIPTION: 62 yo M with COPD and 3ppd smoking admitted 04/12/2012 with acute respiratory failure 2/2 influenza A and aspiration PNA, required intubated 04/13/11 for airway protection and aspirated during intubation. Extubated 1/15 but reintubated emergently 1/18 for severe delerium & resp distress, purulent secretions noted   LINES / TUBES: ETT 1/5 >>1/15 , 1/18 >> L IJ 1/5 >> 1/13 R IJ 1/13 >>  R A-line 1/5 >>1/8 pulled out by pt   CULTURES: Blood culture 1/5>> Negative Resp culture 1/6>> H. influenzae  Resp viral panel 1/6>> Influenza A and Metapneumovirus  Sputum Leigonella Culture 1/6>> Negative Bronch Sputum 1/6>> H. influenzae  Urine 1/5>> Neg  Urine 1/6>> Neg  Flu 1/5>> Neg Blood culture 01/13 >> Catheter tip culture 1/13 >> C.diff PCR 1/14 >> Negative  ANTIBIOTICS: Zosyn 1/5>>1/9  Azithro 1/5>>1/8  Vanc 1/6>>>1/8 - then restarted 01/13 >>> Tamiflu 1/6>>>1/16  Rocephin 1/9>>1/15   SIGNIFICANT EVENTS:  1/5 Intubated in ED  1/5 Shock , levophed  1/6 ECHO - 55%, mod dil rv, PA pressures>>> not measured b/c no TR jet  1/6 Bronched- diffuse pus all lobes  1/7 increasing pCO2, increased RR on vent  1/8 Changed sedation to Precedex, more agitated than with versed  1/9 Changed sedation back to Versed, agitation greatly improved  1/10 scheduled haldol  1/12 Restarting precedex gtt due to persistent agitation 1/13 Febrile to 102.6 overnight. ? Catheter infection? DC'ed L IJ, placed new R IJ. 1/14 No fevers overnight, agitation controlled on propofol (added 1/13), more interactive. 1/15 Had foul smelling stools overnight, therefore, C.diff PCR was sent by RN. 1/15 EXTUBATED, developed A.fib with RVR overnight. 1/16 Persistent episodes of desaturation., afib>started on cardizem drip   1/17 increased agitation   LEVEL OF CARE:  ICU PRIMARY SERVICE:  PCCM CONSULTANTS:  None CODE STATUS: Full DIET:  Reg diet  DVT Px:  Heparin GI Px:  No longer on protonix (on board when intubated)   SUBJECTIVE / INTERVAL HISTORY:  Increased episodes of agitation , inappropriate verbally and physically with staff.  Recurrent afib w/ RVR on cardizem drip  Worsening altered mental status ~10 a w/ increased WOB and hypoxia -requiring intubation     VITAL SIGNS: Temp:  [97.5 F (36.4 C)-98.4 F (36.9 C)] 97.8 F (36.6 C) (01/18 0400) Pulse Rate:  [81-117] 116  (01/18 0000) Resp:  [20-35] 28  (01/17 1935) BP: (108-156)/(65-99) 108/65 mmHg (01/18 0400) SpO2:  [80 %-99 %] 80 % (01/18 0642) FiO2 (%):  [50 %-100 %] 100 % (01/17 2100) HEMODYNAMICS: VENTILATOR SETTINGS: Vent Mode:  [-]  FiO2 (%):  [50 %-100 %] 100 % INTAKE / OUTPUT: Intake/Output      01/17 0701 - 01/18 0700 01/18 0701 - 01/19 0700   P.O.     I.V. (mL/kg) 1160 (12.2)    IV Piggyback 450    Total Intake(mL/kg) 1610 (16.9)    Urine (mL/kg/hr) 1925 (0.8)    Stool     Total Output 1925    Net -315         Urine Occurrence 128 x      PHYSICAL EXAMINATION: General: agitated,in mod distress.   Head: Normocephalic, atraumatic.  Lungs:  Abdominal  respiratory effort. Basilar crackles. No wheezing.  Heart: Irregularly irregular rhythm, tachycardic.  Abdomen:  BS normoactive. Soft, Nondistended, non-tender.  No masses or organomegaly.  Extremities: No pretibial edema.     IMAGING: 1/13 - CXR - 1. Right IJ line tip: SVC. No pneumothorax. Otherwise essentially stable. 1/15 - CXR - Stable fibrotic lung disease and slightly improved right lung aeration. 1/18 CXR >Mild pulmonary congestion without frank evidence of edema. Grossly unchanged right mid and left lower lung atelectasis    DIAGNOSES: Principal Problem:  *Acute respiratory failure with hypercapnia Active Problems:  HYPERLIPIDEMIA  TOBACCO USE   HYPERTENSION  GERD  COPD (chronic obstructive pulmonary disease)  Acute encephalopathy  Acute kidney injury   ASSESSMENT / PLAN:  PULMONARY  Lab 04/22/12 1307 04/20/12 0403  PHART 7.430 7.353  PCO2ART 65.5* 74.5*  PO2ART 55.0* 68.0*  HCO3 43.3* 41.1*  TCO2 45 43   A: 1) Acute hypercapnic respiratory failure/ARDS - secondary to flu, COPD exacerbation. Requiring increased FiO2 /PEEP 1/13 likely contributed by agitation. Extubated 1/15.>reintubate 1/18  2) Aspiration - at time of intubation, finished course of treatment , restarted Vanc 1/13 fever  3) Pneumonitis vs. CAP - b/l interstitial opacities on cxr, slightly increased at left, likely atelectasis. Completed 8/8 days rocephin , on tapering steroids  4) Pulmonary edema - worsening per CXR on 1/12 --> Lasix. To keep neg balance  4) Non-oxygen dependent COPD - h/o chronic tobacco abuse hx 3 pack per day 20+ years. On symbicort at home. Not on home O2 therapy. 5) Bloody secretions from aspiration - Pulm /renal syndrome ruled out.   P:  -Intubate , trans to ICU status  - Duonebs q6h + q2h PRN. - ct Prednisone 40 mg daily taper as able (contributing to agitation)  - Vent support  - Smoking cessation education will be needed. - Lasix to keep neg bal as scr allows   CARDIOVASCULAR  Lab 04/25/12 0445 04/24/12 2148 04/23/12 1200  CKTOTAL -- -- --  CKMB -- -- --  TROPONINI <0.30 <0.30 <0.30    Lab 04/19/12 1228  PROBNP 398.2*   A:  1) New onset atrial fibrillation with RVR - on cardizem and couamdin  2) Elevated proBNP - ProBNP on admission 17911 --> 398 on 1/12. Trop x3 negative. ECHO with normal systolic and diastolic dysfunction, EF 50-55%.  3) HTN -   home regimen Lisinopril 40mg , Norvasc 5mg , and HCTZ 25mg  qd.  4) Hypotension - Resolved. Initially contributed by septic shock likely, previously requiring pressors. Resolved.   PLAN:  - Increased amlodipine to 10mg , will hold increasing antiHTN while diuresing. -  Consult [facility] for new onset A. Fib (likely in setting of volume depletion) -  - Ct IV diltiazem - Goal HR < 120. -hold coumadin & start IV heparin   RENAL  Lab 04/25/12 0445 04/24/12 2147 04/23/12 0400  NA 141 139 154*  K 3.5 4.0 3.4*  CL 99 98 106  CO2 33* 28 39*  BUN 33* 33* 43*  CREATININE 0.65 0.65 0.58  GLUCOSE 136* 139* 136*   A:  1) Acute Kidney injury - Resolved. Previously likely pre-renal in setting of volume depletion. Previously urinary sediment clogging foley, improved.  2) Hypernatremia -  2/2 volume depletion in setting of lasix usage.  P:  - kvo IVFs - Continue to monitor renal function. - hold lasix.  GASTROINTESTINAL  Lab 04/24/12 2147 04/23/12 0400 04/22/12 0358  AST 26 25 19   ALT 25 24 21   ALKPHOS 72 68 59  BILITOT 1.5* 0.9 0.4  PROT 6.9 6.6 6.1  ALBUMIN 3.0*  2.8* 2.4*   A:  1) GERD 2) Diarrhea - resolving. C. Diff negative.  P: -resume TFs  HEMATOLOGIC  Lab 04/25/12 0445 04/24/12 2147 04/24/12 0530 04/23/12 2003  WBC 16.7* 16.7* 14.9* --  HGB 19.4* 19.8* 19.3* --  HCT 56.2* 60.9* 60.6* --  PLT 122* 140* 147* --  APTT -- -- -- --  INR 1.54* -- 0.97 0.90    A:  1) Polycythemia- Hgb stable.  2) Leukocytosis - slight increase since yesterday, although remains afebrile. Vanc and DC of left IJ on 1/13 ( for presumed L IJ catheter infection). He remains on steroids.   P:  - Am CBC  - Sub q hep. - See ID.  INFECTIOUS No results found for this basename: LATICACIDVEN:3,PROCALCITON:3 in the last 168 hours 1) Flu pneumonia - Flu A and metapneumovirus  2) Aspirated at time of intubation, pneumonitis vs PNA 3) Nosocomial infection - likely catheter infection - Left IJ (in 8 days) was dc'ed on 1/13 and catheter tip cultures. Was resumed Vanc on 1/13 in this setting as UA and CXR otherwise not indicating of infection. 4) Diarrhea - C.diff neg. 5) Leukocytosis - remains afebrile, ? Stress response in setting of new onset A.fib? No new  symptoms, CXR stable, don't expect new infective source. But, will remove all extra lines.  P:  - D/C tamiflu - day 10/10.-can dc isolation - Completed 8 day course of Rocephin. - completed Vancomycin for possible catheter infection -Add ceftaz & rpt resp cult   ENDOCRINE  Lab 04/24/12 2135 04/24/12 1636 04/24/12 1214 04/24/12 0815 04/23/12 2301  GLUCAP 164* 172* 171* 140* 158*   A: 1) On steroids - CBGs at goal. Lantus off P:  - Cont ICU Hyperglycemic protocol. - CBG monitoring qAC and HS.    NEUROLOGIC / PSYCHIATRIC   A:  1) Acute encephalopathy - Resolved. Likely secondary to hypoxia / hypercarbia and possible infectious process (CAP?).  2) Delerium - Improved with Propofol + seroquel.while intubated now worse again   P:  - Chair position as much as possible. - use propofol + fent as sedation  Care during the described time interval was provided by me and/or other providers on the critical care team.  I have reviewed this patient's available data, including medical history, events of note, physical examination and test results as part of my evaluation  CC time x  35 m  Briar Sword V.

## 2012-04-25 NOTE — Progress Notes (Signed)
Pt became agitated and restless again. Pt attempted to climb out of bed and tried pulling out IV. Pt again tried to pull me into bed along with saying inappropriate phrases. ELink Dr notified. 5 mg haldol and 1 mg ativan given. Pt calmed down after medication given.

## 2012-04-25 NOTE — Procedures (Signed)
Intubation Procedure Note Cody [name] [id number] [birth year]  Procedure: Intubation Indications: Respiratory insufficiency  Procedure Details Consent: Risks of procedure as well as the alternatives and risks of each were explained to the (patient/caregiver).  Consent for procedure obtained. Time Out: Verified patient identification, verified procedure, site/side was marked, verified correct patient position, special equipment/implants available, medications/allergies/relevent history reviewed, required imaging and test results available.  Performed  Maximum sterile technique was used including antiseptics, cap, gloves, gown, hand hygiene, mask and sheet.  MAC and 3    Evaluation Hemodynamic Status: BP stable throughout; O2 sats: stable throughout Patient's Current Condition: stable Complications: No apparent complications Patient did tolerate procedure well. Chest X-ray ordered to verify placement.  CXR: pending.   [name] V. 04/25/2012

## 2012-04-25 NOTE — Progress Notes (Signed)
NUTRITION FOLLOW UP  Intervention:   - Change TF to Oxepa at goal rate of 20 ml/hr with 60 ml Prostat QID to provide 1520 kcal, 150 g protein, 377 ml free water. TF with propofol will provide a total of 1821 kcal  Nutrition Dx:   Inadequate oral intake related to recent critical illness as evidenced by NPO status, ongoing.  Goal:   Intake to meet >90% of estimated nutrition needs, not met  Monitor:   TF advancement and tolerance, weight, labs, vent status  Assessment:   Patient reintubed on ventilator support. Sedated on propofol at 11.4 ml/hr providing 301 kcal. Adult enteral nutrition protocol ordered.  MV: 9.7 Temp:  Temp Readings from Last 3 Encounters:  04/25/12 98 F (36.7 C) Oral  03/25/12 97.9 F (36.6 C) Oral  02/28/12 97.9 F (36.6 C) Oral    Height: Ht Readings from Last 1 Encounters:  04/25/12 5\' 10"  (1.778 m)    Weight Status:   Wt Readings from Last 1 Encounters:  04/24/12 209 lb 12.8 oz (95.165 kg)   Current BMI=29.9. Will not permissively underfeed patient.  Re-estimated needs:  Kcal: 1906 kcal Protein: 140 g Fluid: 2.2 L  Skin: Intact  Diet Order: General   Intake/Output Summary (Last 24 hours) at 04/25/12 1638 Last data filed at 04/25/12 0600  Gross per 24 hour  Intake   1160 ml  Output   1125 ml  Net     35 ml    Last BM: 1/13   Labs:   Lab 04/25/12 0445 04/24/12 2147 04/24/12 0530 04/23/12 0400  NA 141 139 -- 154*  K 3.5 4.0 -- 3.4*  CL 99 98 -- 106  CO2 33* 28 -- 39*  BUN 33* 33* -- 43*  CREATININE 0.65 0.65 -- 0.58  CALCIUM 9.1 9.6 -- 9.6  MG 1.9 1.9 -- 2.3  PHOS 3.2 -- 2.7 4.2  GLUCOSE 136* 139* -- 136*    CBG (last 3)   Basename 04/25/12 0808 04/24/12 2135 04/24/12 1636  GLUCAP 146* 164* 172*    Scheduled Meds:   . antiseptic oral rinse  15 mL Mouth Rinse q12n4p  . cefTAZidime (FORTAZ)  IV  1 g Intravenous Q8H  . diltiazem  60 mg Oral Q6H  . feeding supplement (JEVITY 1.2 CAL)  1,000 mL Per Tube Q24H  .  insulin aspart  0-9 Units Subcutaneous TID WC  . ipratropium  0.5 mg Nebulization Q6H  . levalbuterol  0.63 mg Nebulization Q6H  . lidocaine (cardiac) 100 mg/34ml      . LORazepam  1 mg Intravenous Once  . predniSONE  40 mg Oral Q breakfast  . rocuronium      . succinylcholine      . vancomycin  1,000 mg Intravenous Q12H    Continuous Infusions:   . diltiazem (CARDIZEM) infusion 15 mg/hr (04/25/12 1246)  . fentaNYL infusion INTRAVENOUS 20 mcg/hr (04/25/12 1057)  . heparin 1,300 Units/hr (04/25/12 1407)  . propofol 20 mcg/kg/min (04/25/12 1408)    Linnell Fulling, RD, LDN Pager #: [phone] After-Hours Pager #: [phone]

## 2012-04-25 NOTE — Progress Notes (Signed)
SUP ordered  

## 2012-04-25 NOTE — Progress Notes (Signed)
ANTICOAGULATION CONSULT NOTE - Initial Consult  Pharmacy Consult for heparin Indication: atrial fibrillation  No Known Allergies  Patient Measurements: Height: 5\' 10"  (177.8 cm) Weight: 209 lb 12.8 oz (95.165 kg) IBW/kg (Calculated) : 73  Heparin Dosing Weight: 95.2kg  Vital Signs: Temp: 98 F (36.7 C) (01/18 0809) Temp src: Oral (01/18 0809) BP: 108/65 mmHg (01/18 0400) Pulse Rate: 116  (01/18 0000)  Labs:  Basename 04/25/12 0903 04/25/12 0445 04/24/12 2148 04/24/12 2147 04/24/12 0530 04/23/12 2003 04/23/12 0400  HGB -- 19.4* -- 19.8* -- -- --  HCT -- 56.2* -- 60.9* 60.6* -- --  PLT -- 122* -- 140* 147* -- --  APTT -- -- -- -- -- -- --  LABPROT -- 18.0* -- -- 12.8 12.1 --  INR -- 1.54* -- -- 0.97 0.90 --  HEPARINUNFRC -- -- -- -- -- -- --  CREATININE -- 0.65 -- 0.65 -- -- 0.58  CKTOTAL -- -- -- -- -- -- --  CKMB -- -- -- -- -- -- --  TROPONINI <0.30 <0.30 <0.30 -- -- -- --    Estimated Creatinine Clearance: 109.5 ml/min (by C-G formula based on Cr of 0.65).   Medical History: Past Medical History  Diagnosis Date  . Stroke   . Hypertension   . Hyperlipidemia   . GERD (gastroesophageal reflux disease)   . COPD (chronic obstructive pulmonary disease)     Assessment: 77 YOM with new onset AFib who was on warfarin and is now being switched to heparin IV. INR this morning was not therapeutic at 1.54. H/H have been elevated through entire admission, while platelets are on the lower end (but appear around baseline).   Goal of Therapy:  Heparin level 0.3-0.7 units/ml Monitor platelets by anticoagulation protocol: Yes   Plan:  1. Heparin bolus 4000 units 2. Start heparin drip at 1300 units/hr 3. 6 hour heparin level (2000) 4. Daily CBC and heparin level  Janelle Spellman D. Haywood Meinders, PharmD Clinical Pharmacist Pager: [phone] 04/25/2012 11:55 AM

## 2012-04-25 NOTE — Progress Notes (Signed)
Patient ID: Cody [name], male   DOB: [birth year], 62 y.o.   MRN: [id number] Events noted in chart reviewed. Ventricular rate was around 140-150 with atrial fib prior to intubation. He is received in 15 mg bolus of diltiazem her Tim, [employer] fundraiser. Rates now about 100. Nothing new to add. Continue diltiazem intravenously until stabilized and extubated.

## 2012-04-25 NOTE — Progress Notes (Signed)
Pt was restless and trying to get out of bed. Once calmed down I asked if there was anything else I could get for him. He grabbed at my chest and tried to pull me into the bed. He also stated "you can suck my dick."

## 2012-04-25 NOTE — Progress Notes (Signed)
ANTICOAGULATION CONSULT NOTE - Follow Up  Pharmacy Consult for heparin Indication: atrial fibrillation  No Known Allergies  Patient Measurements: Height: 5\' 10"  (177.8 cm) Weight: 209 lb 12.8 oz (95.165 kg) IBW/kg (Calculated) : 73  Heparin Dosing Weight: 95.2kg  Vital Signs: BP: 118/78 mmHg (01/18 1946) Pulse Rate: 91  (01/18 1946)  Labs:  Basename 04/25/12 1950 04/25/12 0903 04/25/12 0445 04/24/12 2148 04/24/12 2147 04/24/12 0530 04/23/12 2003 04/23/12 0400  HGB -- -- 19.4* -- 19.8* -- -- --  HCT -- -- 56.2* -- 60.9* 60.6* -- --  PLT -- -- 122* -- 140* 147* -- --  APTT -- -- -- -- -- -- -- --  LABPROT -- -- 18.0* -- -- 12.8 12.1 --  INR -- -- 1.54* -- -- 0.97 0.90 --  HEPARINUNFRC <0.10* -- -- -- -- -- -- --  CREATININE -- -- 0.65 -- 0.65 -- -- 0.58  CKTOTAL -- -- -- -- -- -- -- --  CKMB -- -- -- -- -- -- -- --  TROPONINI -- <0.30 <0.30 <0.30 -- -- -- --    Estimated Creatinine Clearance: 109.5 ml/min (by C-G formula based on Cr of 0.65).  Medical History: Past Medical History  Diagnosis Date  . Stroke   . Hypertension   . Hyperlipidemia   . GERD (gastroesophageal reflux disease)   . COPD (chronic obstructive pulmonary disease)    Assessment: 10 YOM with new onset AFib who was on warfarin and is now being switched to heparin IV. INR this morning was not therapeutic at 1.54. H/H have been elevated through entire admission, while platelets are on the lower end (but appear around baseline).  His initial heparin level was < 0.1 on 1300 units/hr.   No noted bleeding complications.  Goal of Therapy:  Heparin level 0.3-0.7 units/ml Monitor platelets by anticoagulation protocol: Yes   Plan:  1.  Continue IV heparin drip but increase to 1500 units/hr 2.  Check 8  hour heparin level  3.  Daily CBC and heparin level  Nadara Mustard, PharmD., MS Clinical Pharmacist Pager:  [phone] Thank you for allowing pharmacy to be part of this patients care team. 04/25/2012  8:44 PM

## 2012-04-25 NOTE — Progress Notes (Signed)
Oliguria   Foley catheter ordered

## 2012-04-26 ENCOUNTER — Inpatient Hospital Stay (HOSPITAL_COMMUNITY): Payer: Medicaid Other

## 2012-04-26 DIAGNOSIS — I4891 Unspecified atrial fibrillation: Secondary | ICD-10-CM

## 2012-04-26 LAB — CULTURE, BLOOD (ROUTINE X 2)
Culture: NO GROWTH
Culture: NO GROWTH

## 2012-04-26 LAB — BASIC METABOLIC PANEL
CO2: 34 mEq/L — ABNORMAL HIGH (ref 19–32)
Chloride: 101 mEq/L (ref 96–112)
GFR calc Af Amer: >90 mL/min (ref >90)
Potassium: 3.3 mEq/L — ABNORMAL LOW (ref 3.5–5.1)
Sodium: 141 mEq/L (ref 135–145)

## 2012-04-26 LAB — CBC
HCT: 55.5 % — ABNORMAL HIGH (ref 39.0–52.0)
MCV: 94.2 fL (ref 78.0–100.0)
RBC: 5.89 MIL/uL — ABNORMAL HIGH (ref 4.22–5.81)
WBC: 14.1 10*3/uL — ABNORMAL HIGH (ref 4.0–10.5)

## 2012-04-26 LAB — POCT I-STAT 3, ART BLOOD GAS (G3+)
O2 Saturation: 88 %
Patient temperature: 98.6
pCO2 arterial: 54.8 mmHg — ABNORMAL HIGH (ref 35.0–45.0)
pH, Arterial: 7.387 (ref 7.350–7.450)

## 2012-04-26 LAB — HEPARIN LEVEL (UNFRACTIONATED): Heparin Unfractionated: 0.23 IU/mL — ABNORMAL LOW (ref 0.30–0.70)

## 2012-04-26 LAB — GLUCOSE, CAPILLARY
Glucose-Capillary: 103 mg/dL — ABNORMAL HIGH (ref 70–99)
Glucose-Capillary: 153 mg/dL — ABNORMAL HIGH (ref 70–99)
Glucose-Capillary: 189 mg/dL — ABNORMAL HIGH (ref 70–99)
Glucose-Capillary: 187 mg/dL — ABNORMAL HIGH (ref 70–99)

## 2012-04-26 LAB — PHOSPHORUS: Phosphorus: 4.3 mg/dL (ref 2.3–4.6)

## 2012-04-26 MED ORDER — POTASSIUM CHLORIDE 10 MEQ/50ML IV SOLN
10.0000 meq | INTRAVENOUS | Status: AC
  Administered 2012-04-26 (×4): 10 meq via INTRAVENOUS
  Filled 2012-04-26 (×4): qty 50

## 2012-04-26 MED ORDER — HEPARIN (PORCINE) IN NACL 100-0.45 UNIT/ML-% IJ SOLN
2100.0000 [IU]/h | INTRAMUSCULAR | Status: DC
  Administered 2012-04-27: 2100 [IU]/h via INTRAVENOUS
  Filled 2012-04-26 (×4): qty 250

## 2012-04-26 MED ORDER — FUROSEMIDE 10 MG/ML IJ SOLN
INTRAMUSCULAR | Status: AC
  Administered 2012-04-26: 10:00:00
  Filled 2012-04-26: qty 4

## 2012-04-26 MED ORDER — HEPARIN BOLUS VIA INFUSION
3700.0000 [IU] | Freq: Once | INTRAVENOUS | Status: AC
  Administered 2012-04-26: 3700 [IU] via INTRAVENOUS
  Filled 2012-04-26: qty 3700

## 2012-04-26 MED ORDER — POTASSIUM CHLORIDE 10 MEQ/50ML IV SOLN
10.0000 meq | INTRAVENOUS | Status: AC
  Administered 2012-04-26: 10 meq via INTRAVENOUS

## 2012-04-26 MED ORDER — PREDNISONE 20 MG PO TABS
30.0000 mg | ORAL_TABLET | Freq: Every day | ORAL | Status: DC
  Administered 2012-04-26 – 2012-04-28 (×3): 30 mg
  Filled 2012-04-26 (×4): qty 1

## 2012-04-26 MED ORDER — FUROSEMIDE 10 MG/ML IJ SOLN
20.0000 mg | Freq: Once | INTRAMUSCULAR | Status: AC
  Administered 2012-04-26: 20 mg via INTRAVENOUS

## 2012-04-26 MED ORDER — VANCOMYCIN HCL IN DEXTROSE 1-5 GM/200ML-% IV SOLN
1000.0000 mg | Freq: Three times a day (TID) | INTRAVENOUS | Status: DC
  Administered 2012-04-26 – 2012-05-02 (×17): 1000 mg via INTRAVENOUS
  Filled 2012-04-26 (×19): qty 200

## 2012-04-26 MED ORDER — HEPARIN BOLUS VIA INFUSION
1500.0000 [IU] | INTRAVENOUS | Status: AC
  Administered 2012-04-26: 1500 [IU] via INTRAVENOUS
  Filled 2012-04-26: qty 1500

## 2012-04-26 NOTE — Progress Notes (Addendum)
PULMONARY  / CRITICAL CARE MEDICINE  Name: Cody [name] MRN: [id number] DOB: 06-10-1950    LOS: 14  REFERRING MD:   EDP - Dr. Preston Fleeting  CHIEF COMPLAINT:  Shortness of breath  BRIEF PATIENT DESCRIPTION: 62 yo M with COPD and 3ppd smoking admitted 04/12/2012 with acute respiratory failure 2/2 influenza A and aspiration PNA, required intubated 04/13/11 for airway protection and aspirated during intubation. Extubated 1/15 but reintubated emergently 1/18 for severe delerium & resp distress, purulent secretions noted   LINES / TUBES: ETT 1/5 >>1/15 , 1/18 >> L IJ 1/5 >> 1/13 R IJ 1/13 >> 1/16 RIJ 1/18 >>  R A-line 1/5 >>1/8 pulled out by pt   CULTURES: Blood culture 1/5>> Negative Resp culture 1/6>> H. influenzae  Resp viral panel 1/6>> Influenza A and Metapneumovirus  Sputum Leigonella Culture 1/6>> Negative Bronch Sputum 1/6>> H. influenzae  Urine 1/5>> Neg  Urine 1/6>> Neg  Flu 1/5>> Neg Blood culture 01/13 >> Catheter tip culture 1/13 >>Staph -coag neg-70 colonies(vanc sens)  C.diff PCR 1/14 >> Negative  ANTIBIOTICS: Zosyn 1/5>>1/9  Azithro 1/5>>1/8  Vanc 1/6>>>1/8 - then restarted 01/13 >>> Tamiflu 1/6>>>1/16  Rocephin 1/9>>1/15 Ceftazidine 1/18 >>   SIGNIFICANT EVENTS:  1/5 Intubated in ED  1/5 Shock , levophed  1/6 ECHO - 55%, mod dil rv, PA pressures>>> not measured b/c no TR jet  1/6 Bronched- diffuse pus all lobes  1/7 increasing pCO2, increased RR on vent  1/8 Changed sedation to Precedex, more agitated than with versed  1/9 Changed sedation back to Versed, agitation greatly improved  1/10 scheduled haldol  1/12 Restarting precedex gtt due to persistent agitation 1/13 Febrile to 102.6 overnight. ? Catheter infection? DC'ed L IJ, placed new R IJ. 1/14 No fevers overnight, agitation controlled on propofol (added 1/13), more interactive. 1/15 Had foul smelling stools overnight, therefore, C.diff PCR was sent by RN. 1/15 EXTUBATED, developed A.fib with RVR  overnight. 1/16 Persistent episodes of desaturation., afib>started on cardizem drip  1/17 increased agitation  1/18 reintubated   LEVEL OF CARE:  ICU PRIMARY SERVICE:  PCCM CONSULTANTS:  None CODE STATUS: Full DIET:  Reg diet  DVT Px:  Heparin GI Px:  No longer on protonix (on board when intubated)   SUBJECTIVE / INTERVAL HISTORY:  reintubated 1/18  , requiring increased fio2 .70 Follows commands on WUA , decreased agitation  On IV cardizem for RVR    VITAL SIGNS: Temp:  [97.6 F (36.4 C)-98.8 F (37.1 C)] 98.8 F (37.1 C) (01/19 0400) Pulse Rate:  [79-108] 90  (01/19 0600) Resp:  [16-36] 18  (01/19 0600) BP: (78-149)/(54-95) 94/66 mmHg (01/19 0600) SpO2:  [87 %-100 %] 92 % (01/19 0600) FiO2 (%):  [40 %-100 %] 60 % (01/19 0400) Weight:  [93.5 kg (206 lb 2.1 oz)] 93.5 kg (206 lb 2.1 oz) (01/19 0400) HEMODYNAMICS: VENTILATOR SETTINGS: Vent Mode:  [-] PRVC FiO2 (%):  [40 %-100 %] 60 % Set Rate:  [16 bmp] 16 bmp Vt Set:  [580 mL] 580 mL PEEP:  [5 cmH20] 5 cmH20 Plateau Pressure:  [15 cmH20-16 cmH20] 16 cmH20 INTAKE / OUTPUT: Intake/Output      01/18 0701 - 01/19 0700 01/19 0701 - 01/20 0700   I.V. (mL/kg) 1258.6 (13.5)    NG/GT 410    IV Piggyback 560    Total Intake(mL/kg) 2228.6 (23.8)    Urine (mL/kg/hr) 875 (0.4)    Total Output 875    Net +1353.6  PHYSICAL EXAMINATION: General: Sedated follows commands   Head: Normocephalic, atraumatic.  Lungs:  Diminished BS , no wheezing   Heart: Irregularly irregular rhythm, tachycardic.  Abdomen:  BS normoactive. Soft, Nondistended, non-tender.  No masses or organomegaly.  Extremities: No pretibial edema.     IMAGING: 1/13 - CXR - 1. Right IJ line tip: SVC. No pneumothorax. Otherwise essentially stable. 1/15 - CXR - Stable fibrotic lung disease and slightly improved right lung aeration. 1/18 CXR >Mild pulmonary congestion without frank evidence of edema. Grossly unchanged right mid and left lower lung  atelectasis 1/19 CXR >Similar bibasilar volume loss with probable small left pleural  effusion.      DIAGNOSES: Principal Problem:  *Acute respiratory failure with hypercapnia Active Problems:  HYPERLIPIDEMIA  TOBACCO USE  HYPERTENSION  GERD  COPD (chronic obstructive pulmonary disease)  Acute encephalopathy  Acute kidney injury   ASSESSMENT / PLAN:  PULMONARY  Lab 04/25/12 1226 04/22/12 1307 04/20/12 0403  PHART 7.350 7.430 7.353  PCO2ART 60.6* 65.5* 74.5*  PO2ART 130.0* 55.0* 68.0*  HCO3 33.5* 43.3* 41.1*  TCO2 35 45 43   A: 1) Acute hypercapnic respiratory failure/ARDS - secondary to flu, COPD exacerbation. Requiring increased FiO2 /PEEP 1/13 likely contributed by agitation. Extubated 1/15.>reintubate 1/18  2) Aspiration - at time of intubation, finished course of treatment , restarted Vanc 1/13 fever  3) Pneumonitis vs. CAP - b/l interstitial opacities on cxr, slightly increased at left, likely atelectasis. Completed 8/8 days rocephin , on tapering steroids  4) Pulmonary edema - worsening per CXR on 1/12 --> Lasix. To keep neg balance  4) Non-oxygen dependent COPD - h/o chronic tobacco abuse hx 3 pack per day 20+ years. On symbicort at home. Not on home O2 therapy. 5) Bloody secretions from aspiration - Pulm /renal syndrome ruled out.   P:  -Add PEEP 8, no SBts - Duonebs q6h + q2h PRN. - decrease  Prednisone 30  mg daily taper as able (contributing to agitation)  -- Smoking cessation education will be needed. - Lasix 20mg  IV x 1 to keep neg bal as scr allows    CARDIOVASCULAR  Lab 04/25/12 0903 04/25/12 0445 04/24/12 2148  CKTOTAL -- -- --  CKMB -- -- --  TROPONINI <0.30 <0.30 <0.30    Lab 04/19/12 1228  PROBNP 398.2*   A:  1) New onset atrial fibrillation with RVR - on cardizem and Hep  2) Elevated proBNP - ProBNP on admission 17911 --> 398 on 1/12. Trop x3 negative. ECHO with normal systolic and diastolic dysfunction, EF 50-55%.  3) HTN -   home  regimen Lisinopril 40mg , Norvasc 5mg , and HCTZ 25mg  qd. -on hold  4) Hypotension - Resolved. Initially contributed by septic shock likely, previously requiring pressors. Resolved.   PLAN:  - taper Cardizem drip , add cardizem via tube -   - Goal HR < 120. -hold coumadin &  IV heparin until extubated  -lasix 20mg  IV    RENAL  Lab 04/26/12 0413 04/25/12 0445 04/24/12 2147  NA 141 141 139  K 3.3* 3.5 4.0  CL 101 99 98  CO2 34* 33* 28  BUN 41* 33* 33*  CREATININE 0.77 0.65 0.65  GLUCOSE 109* 136* 139*   A:  1) Acute Kidney injury - Resolved. Previously likely pre-renal in setting of volume depletion. Previously urinary sediment clogging foley, improved.  2) Hypernatremia - resolved  3) Hypokalemia   P:  - kvo IVFs - Continue to monitor renal function. -replace K+  GASTROINTESTINAL  Lab 04/24/12 2147 04/23/12 0400 04/22/12 0358  AST 26 25 19   ALT 25 24 21   ALKPHOS 72 68 59  BILITOT 1.5* 0.9 0.4  PROT 6.9 6.6 6.1  ALBUMIN 3.0* 2.8* 2.4*   A:  1) GERD 2) Diarrhea - resolving. C. Diff negative.  P: -resume TFs  HEMATOLOGIC  Lab 04/26/12 0413 04/25/12 0445 04/24/12 2147 04/24/12 0530 04/23/12 2003  WBC 14.1* 16.7* 16.7* -- --  HGB 17.7* 19.4* 19.8* -- --  HCT 55.5* 56.2* 60.9* -- --  PLT 122* 122* 140* -- --  APTT -- -- -- -- --  INR -- 1.54* -- 0.97 0.90    A:  1) Polycythemia- Hgb stable.  2) Leukocytosis -  DC of left IJ on 1/13 (cath tip infection-staph/coag neg )  He remains on steroids.   P:  - Am CBC  - See ID.  INFECTIOUS No results found for this basename: LATICACIDVEN:3,PROCALCITON:3 in the last 168 hours 1) Flu pneumonia - Flu A and metapneumovirus >finished full course of Tamiflu , droplet isolation d/c 1/18 2) Aspirated at time of intubation, pneumonitis vs PNA>finished flu course of Rocephin  3) Nosocomial infection -  Left IJ (in 8 days) was dc'ed on 1/13 and catheter tip cultures>staph/coag neg . Was resumed Vanc on 1/13 4) Diarrhea -  C.diff neg. 5) Leukocytosis - remains afebrile, Ceftaz added 1/18 w/ sputum cx sent  P:   Continue ceftaz  Follow cx data  ENDOCRINE  Lab 04/26/12 0417 04/25/12 2332 04/25/12 1926 04/25/12 1829 04/25/12 0808  GLUCAP 103* 158* 136* 163* 146*   A: 1) On steroids - CBGs at goal. Lantus off P:  - Cont ICU Hyperglycemic protocol. - CBG monitoring qAC and HS.    NEUROLOGIC / PSYCHIATRIC   A:  1) Acute encephalopathy - Resolved. Likely secondary to hypoxia / hypercarbia and possible infectious process (CAP?). >returned 1/18 >requiring intubation  2) Delerium - returned 1/18 , improved w/ sedation propofol /fent  P:  - use propofol + fent as sedation   PARRETT,TAMMY NP -C    Care during the described time interval was provided by me and/or other providers on the critical care team.  I have reviewed this patient's available data, including medical history, events of note, physical examination and test results as part of my evaluation   cc time x 35 m  ALVA,RAKESH V.

## 2012-04-26 NOTE — Progress Notes (Signed)
PROGRESS NOTE  Subjective:   Cody [name] is a 62 yo with hx of COPD, long hx of heavy cigarette smoking.   He was admitted with respiratory failure and was noted to have rapid afib ( while he was in respiratory distress).  He has a hx of TIA / CVA years.  He is intubated, sedated Objective:    Vital Signs:   Temp:  [97.6 F (36.4 C)-98.9 F (37.2 C)] 98.9 F (37.2 C) (01/19 0947) Pulse Rate:  [79-108] 87  (01/19 0947) Resp:  [16-24] 18  (01/19 0600) BP: (78-149)/(54-95) 99/68 mmHg (01/19 0947) SpO2:  [87 %-100 %] 95 % (01/19 0947) FiO2 (%):  [40 %-70 %] 70 % (01/19 0947) Weight:  [206 lb 2.1 oz (93.5 kg)] 206 lb 2.1 oz (93.5 kg) (01/19 0400)  Last BM Date: 04/23/12   24-hour weight change: Weight change:   Weight trends: Filed Weights   04/22/12 0439 May 05, 2012 0500 04/26/12 0400  Weight: 218 lb 7.6 oz (99.1 kg) 209 lb 12.8 oz (95.165 kg) 206 lb 2.1 oz (93.5 kg)    Intake/Output:  01/18 0701 - 01/19 0700 In: 2248.6 [I.V.:1258.6; NG/GT:430; IV Piggyback:560] Out: 925 [Urine:925]     Physical Exam: BP 99/68  Pulse 87  Temp 98.9 F (37.2 C) (Axillary)  Resp 18  Ht 5\' 10"  (1.778 m)  Wt 206 lb 2.1 oz (93.5 kg)  BMI 29.58 kg/m2  SpO2 95%  General: Vital signs reviewed and noted. Sedated in CCU, on vent  Head: Normocephalic, atraumatic.  Panda in  Eyes: conjunctivae/corneas clear.  EOM's intact.   Throat: normal  Neck:  thick  Lungs:   On vent  Heart:  RR  Abdomen:  Soft, non-tender, non-distended    Extremities: No edema   Neurologic: A&O X3, CN II - XII are grossly intact.   Psych: Normal     Labs: BMET:  Basename 04/26/12 0413 04/25/12 0445 05-05-12 2147  NA 141 141 --  K 3.3* 3.5 --  CL 101 99 --  CO2 34* 33* --  GLUCOSE 109* 136* --  BUN 41* 33* --  CREATININE 0.77 0.65 --  CALCIUM 8.9 9.1 --  MG -- 1.9 1.9  PHOS 4.3 3.2 --    Liver function tests:  [facility] May 05, 2012 2147  AST 26  ALT 25  ALKPHOS 72  BILITOT 1.5*  PROT 6.9  ALBUMIN  3.0*   No results found for this basename: LIPASE:2,AMYLASE:2 in the last 72 hours  CBC:  Basename 04/26/12 0413 04/25/12 0445 May 05, 2012 0530  WBC 14.1* 16.7* --  NEUTROABS -- -- 11.3*  HGB 17.7* 19.4* --  HCT 55.5* 56.2* --  MCV 94.2 94.3 --  PLT 122* 122* --    Cardiac Enzymes:  Basename 04/25/12 0903 04/25/12 0445 2012-05-05 2148 04/23/12 1200  CKTOTAL -- -- -- --  CKMB -- -- -- --  TROPONINI <0.30 <0.30 <0.30 <0.30    Coagulation Studies:  Basename 04/25/12 0445 May 05, 2012 0530 04/23/12 2003  LABPROT 18.0* 12.8 12.1  INR 1.54* 0.97 0.90    Other: No components found with this basename: POCBNP:3 No results found for this basename: DDIMER in the last 72 hours  Basename May 05, 2012 0530  HGBA1C 7.1*   No results found for this basename: CHOL,HDL,LDLCALC,TRIG,CHOLHDL in the last 72 hours  Basename 04/25/12 0903  TSH 1.494  T4TOTAL --  T3FREE --  THYROIDAB --   No results found for this basename: VITAMINB12,FOLATE,FERRITIN,TIBC,IRON,RETICCTPCT in the last 72 hours    Tele:  NSR  Medications:    Infusions:    . sodium chloride 10 mL/hr at 04/25/12 2000  . sodium chloride    . diltiazem (CARDIZEM) infusion 10 mg/hr (04/26/12 0934)  . feeding supplement (OXEPA) 1,000 mL (04/25/12 1938)  . fentaNYL infusion INTRAVENOUS 100 mcg/hr (04/26/12 0200)  . heparin 1,700 Units/hr (04/26/12 0456)  . propofol 20 mcg/kg/min (04/26/12 0204)    Scheduled Medications:    . antiseptic oral rinse  15 mL Mouth Rinse QID  . cefTAZidime (FORTAZ)  IV  1 g Intravenous Q8H  . chlorhexidine  15 mL Mouth Rinse BID  . diltiazem  60 mg Oral Q6H  . feeding supplement  60 mL Per Tube QID  . insulin aspart  0-9 Units Subcutaneous Q4H  . ipratropium  0.5 mg Nebulization Q6H  . levalbuterol  0.63 mg Nebulization Q6H  . pantoprazole (PROTONIX) IV  40 mg Intravenous QHS  . potassium chloride  10 mEq Intravenous Q1 Hr x 4  . predniSONE  30 mg Per Tube Q breakfast  . vancomycin  1,000  mg Intravenous Q12H    Assessment/ Plan:    Acute respiratory failure with hypercapnia (04/12/2012) Secondary to COPD and Influenza  HYPERLIPIDEMIA (01/15/2008)  TOBACCO USE (01/15/2008)  HYPERTENSION (11/13/2006)   BP is well controlled.  Atrial Fibrillation:  In the setting of acute respiratory failure.  He has had a CVA in the past.  I agree with previous recs for coumadin. Converting IV Dilt to PO dilt.    COPD (chronic obstructive pulmonary disease) (06/19/2011)   Acute encephalopathy (04/12/2012)  Acute kidney injury (04/12/2012)     Disposition:  Length of Stay: 14  Vesta Mixer, Montez Hageman., MD, [facility] 04/26/2012, 10:47 AM Office [phone] Pager [phone]

## 2012-04-26 NOTE — Progress Notes (Signed)
ANTICOAGULATION CONSULT NOTE - Follow Up  Pharmacy Consult for heparin Indication: atrial fibrillation  No Known Allergies  Patient Measurements: Height: 5\' 10"  (177.8 cm) Weight: 206 lb 2.1 oz (93.5 kg) IBW/kg (Calculated) : 73  Heparin Dosing Weight: 95.2kg  Vital Signs: Temp: 98.8 F (37.1 C) (01/19 0400) Temp src: Oral (01/19 0400) BP: 104/69 mmHg (01/19 0400) Pulse Rate: 86  (01/19 0400)  Labs:  Basename 04/26/12 0413 04/25/12 1950 04/25/12 0903 04/25/12 0445 04/24/12 2148 04/24/12 2147 04/24/12 0530 04/23/12 2003  HGB -- -- -- 19.4* -- 19.8* -- --  HCT -- -- -- 56.2* -- 60.9* 60.6* --  PLT -- -- -- 122* -- 140* 147* --  APTT -- -- -- -- -- -- -- --  LABPROT -- -- -- 18.0* -- -- 12.8 12.1  INR -- -- -- 1.54* -- -- 0.97 0.90  HEPARINUNFRC 0.12* <0.10* -- -- -- -- -- --  CREATININE -- -- -- 0.65 -- 0.65 -- --  CKTOTAL -- -- -- -- -- -- -- --  CKMB -- -- -- -- -- -- -- --  TROPONINI -- -- <0.30 <0.30 <0.30 -- -- --    Estimated Creatinine Clearance: 108.5 ml/min (by C-G formula based on Cr of 0.65).  Medical History: Past Medical History  Diagnosis Date  . Stroke   . Hypertension   . Hyperlipidemia   . GERD (gastroesophageal reflux disease)   . COPD (chronic obstructive pulmonary disease)    Assessment: 46 YOM with new onset AFib who was on warfarin and is now being switched to heparin IV. Heparin level this am remains subtherapeutic.  Goal of Therapy:  Heparin level 0.3-0.7 units/ml Monitor platelets by anticoagulation protocol: Yes   Plan:  1.  Increase IV heparin drip to 1700 units/hr 2.  Check 6  hour heparin level    Talbert Cage, PharmD. Clinical Pharmacist Pager:  [phone] Thank you for allowing pharmacy to be part of this patients care team. 04/26/2012 4:53 AM

## 2012-04-26 NOTE — Progress Notes (Signed)
ANTICOAGULATION and ANTIBIOTIC CONSULT NOTE - Follow Up  Pharmacy Consult for heparin and Vancomycin Indication: atrial fibrillation/ PNA  No Known Allergies  Patient Measurements: Height: 5\' 10"  (177.8 cm) Weight: 206 lb 2.1 oz (93.5 kg) IBW/kg (Calculated) : 73  Heparin Dosing Weight: 95.2kg  Vital Signs: Temp: 97.4 F (36.3 C) (01/19 1146) Temp src: Oral (01/19 1146) BP: 113/75 mmHg (01/19 1156) Pulse Rate: 92  (01/19 1100)  Labs:  Basename 04/26/12 1350 04/26/12 1150 04/26/12 0413 04/25/12 0903 04/25/12 0445 04/24/12 2148 04/24/12 2147 04/24/12 0530 04/23/12 2003  HGB -- -- 17.7* -- 19.4* -- -- -- --  HCT -- -- 55.5* -- 56.2* -- 60.9* -- --  PLT -- -- 122* -- 122* -- 140* -- --  APTT -- -- -- -- -- -- -- -- --  LABPROT -- -- -- -- 18.0* -- -- 12.8 12.1  INR -- -- -- -- 1.54* -- -- 0.97 0.90  HEPARINUNFRC 0.17* <0.10* 0.12* -- -- -- -- -- --  CREATININE -- -- 0.77 -- 0.65 -- 0.65 -- --  CKTOTAL -- -- -- -- -- -- -- -- --  CKMB -- -- -- -- -- -- -- -- --  TROPONINI -- -- -- <0.30 <0.30 <0.30 -- -- --    Estimated Creatinine Clearance: 108.5 ml/min (by C-G formula based on Cr of 0.77).  Medical History: Past Medical History  Diagnosis Date  . Stroke   . Hypertension   . Hyperlipidemia   . GERD (gastroesophageal reflux disease)   . COPD (chronic obstructive pulmonary disease)    Assessment: AC: 85 YOM with new onset AFib who was on warfarin and is now on subtherapeutic heparin IV. Heparin level this am remains subtherapeutic. Hgb slight drop, plts stable. No note of bleeding  ID: Pt continues on Vancomycin. WBC trending back down today 14.1<<16.7, afebrile. Per Dr. Vassie Loll 1/18 had purulent secretions when traching, so want to cover HCAP with vanc and ceftaz    Goal of Therapy:  Heparin level 0.3-0.7 units/ml Monitor platelets by anticoagulation protocol: Yes   Plan:  - heparin bolus with 3700 units x 1 - heparin drip 1900units/hr - 6 hour HL (2030) - Change  Vancomycin 1g IV q8h- likely need VT if abx to continue much longer--f/u LOT  Thank you, Franchot Erichsen, Pharm.D. Clinical Pharmacist   Pager: [phone] 04/26/2012 2:35 PM

## 2012-04-26 NOTE — Progress Notes (Signed)
ANTICOAGULATION CONSULT NOTE - Follow Up  Pharmacy Consult for heparin Indication: atrial fibrillation  No Known Allergies  Patient Measurements: Height: 5\' 10"  (177.8 cm) Weight: 206 lb 2.1 oz (93.5 kg) IBW/kg (Calculated) : 73  Heparin Dosing Weight: 93kg  Vital Signs: Temp: 99.1 F (37.3 C) (01/19 2000) Temp src: Oral (01/19 2000) BP: 109/67 mmHg (01/19 2000) Pulse Rate: 90  (01/19 2000)  Labs:  Basename 04/26/12 2015 04/26/12 1350 04/26/12 1150 04/26/12 0413 04/25/12 0903 04/25/12 0445 04/24/12 2148 04/24/12 2147 04/24/12 0530  HGB -- -- -- 17.7* -- 19.4* -- -- --  HCT -- -- -- 55.5* -- 56.2* -- 60.9* --  PLT -- -- -- 122* -- 122* -- 140* --  APTT -- -- -- -- -- -- -- -- --  LABPROT -- -- -- -- -- 18.0* -- -- 12.8  INR -- -- -- -- -- 1.54* -- -- 0.97  HEPARINUNFRC 0.23* 0.17* <0.10* -- -- -- -- -- --  CREATININE -- -- -- 0.77 -- 0.65 -- 0.65 --  CKTOTAL -- -- -- -- -- -- -- -- --  CKMB -- -- -- -- -- -- -- -- --  TROPONINI -- -- -- -- <0.30 <0.30 <0.30 -- --    Estimated Creatinine Clearance: 108.5 ml/min (by C-G formula based on Cr of 0.77).  Medical History: Past Medical History  Diagnosis Date  . Stroke   . Hypertension   . Hyperlipidemia   . GERD (gastroesophageal reflux disease)   . COPD (chronic obstructive pulmonary disease)    Assessment: 6 hr heparin level = 0.23 on IV heparin rate 1900 units/hr in this 30 YOM with new onset AFib.  Heparin level remains subtherapeutic. No note of bleeding   Goal of Therapy:  Heparin level 0.3-0.7 units/ml Monitor platelets by anticoagulation protocol: Yes   Plan:  - heparin bolus with 1500 units x 1 - Increase heparin drip to 2100units/hr - Check 6 hour HL   Thank you, Noah Delaine, RPh Clinical Pharmacist Pager: [phone] 04/26/2012 8:40 PM

## 2012-04-27 ENCOUNTER — Inpatient Hospital Stay (HOSPITAL_COMMUNITY): Payer: Medicaid Other

## 2012-04-27 DIAGNOSIS — I4891 Unspecified atrial fibrillation: Secondary | ICD-10-CM

## 2012-04-27 LAB — BLOOD GAS, ARTERIAL
Acid-Base Excess: 8.1 mmol/L — ABNORMAL HIGH (ref 0.0–2.0)
Drawn by: 331761
FIO2: 0.6 %
MECHVT: 580 mL
O2 Saturation: 91.9 %
Patient temperature: 98.3
RATE: 16 resp/min
TCO2: 35 mmol/L (ref 0–100)

## 2012-04-27 LAB — GLUCOSE, CAPILLARY
Glucose-Capillary: 134 mg/dL — ABNORMAL HIGH (ref 70–99)
Glucose-Capillary: 146 mg/dL — ABNORMAL HIGH (ref 70–99)
Glucose-Capillary: 218 mg/dL — ABNORMAL HIGH (ref 70–99)

## 2012-04-27 LAB — PHOSPHORUS: Phosphorus: 3.1 mg/dL (ref 2.3–4.6)

## 2012-04-27 LAB — BASIC METABOLIC PANEL
CO2: 32 mEq/L (ref 19–32)
Calcium: 8.6 mg/dL (ref 8.4–10.5)
Chloride: 100 mEq/L (ref 96–112)
Glucose, Bld: 134 mg/dL — ABNORMAL HIGH (ref 70–99)
Sodium: 137 mEq/L (ref 135–145)

## 2012-04-27 LAB — PRO B NATRIURETIC PEPTIDE: Pro B Natriuretic peptide (BNP): 241.4 pg/mL — ABNORMAL HIGH (ref 0–125)

## 2012-04-27 LAB — CBC
Hemoglobin: 17.1 g/dL — ABNORMAL HIGH (ref 13.0–17.0)
MCH: 30.3 pg (ref 26.0–34.0)
Platelets: 122 10*3/uL — ABNORMAL LOW (ref 150–400)
RBC: 5.64 MIL/uL (ref 4.22–5.81)
WBC: 13.3 10*3/uL — ABNORMAL HIGH (ref 4.0–10.5)

## 2012-04-27 LAB — HEPARIN LEVEL (UNFRACTIONATED): Heparin Unfractionated: 0.24 IU/mL — ABNORMAL LOW (ref 0.30–0.70)

## 2012-04-27 LAB — PROCALCITONIN: Procalcitonin: 0.14 ng/mL

## 2012-04-27 MED ORDER — DILTIAZEM HCL 100 MG IV SOLR
5.0000 mg/h | INTRAVENOUS | Status: DC
  Administered 2012-04-27: 10 mg/h via INTRAVENOUS
  Filled 2012-04-27: qty 100

## 2012-04-27 MED ORDER — DILTIAZEM HCL 60 MG PO TABS
60.0000 mg | ORAL_TABLET | Freq: Four times a day (QID) | ORAL | Status: DC
  Administered 2012-04-27 – 2012-04-29 (×7): 60 mg via ORAL
  Filled 2012-04-27 (×11): qty 1

## 2012-04-27 MED ORDER — HEPARIN (PORCINE) IN NACL 100-0.45 UNIT/ML-% IJ SOLN
2300.0000 [IU]/h | INTRAMUSCULAR | Status: DC
  Administered 2012-04-27 (×2): 2300 [IU]/h via INTRAVENOUS
  Filled 2012-04-27 (×3): qty 250

## 2012-04-27 MED ORDER — PRO-STAT SUGAR FREE PO LIQD
60.0000 mL | Freq: Three times a day (TID) | ORAL | Status: DC
  Administered 2012-04-27 – 2012-04-28 (×5): 60 mL
  Filled 2012-04-27 (×8): qty 60

## 2012-04-27 MED ORDER — HEPARIN (PORCINE) IN NACL 100-0.45 UNIT/ML-% IJ SOLN
2700.0000 [IU]/h | INTRAMUSCULAR | Status: AC
  Administered 2012-04-28 (×2): 2700 [IU]/h via INTRAVENOUS
  Administered 2012-04-28: 2500 [IU]/h via INTRAVENOUS
  Administered 2012-04-28: 2700 [IU]/h via INTRAVENOUS
  Filled 2012-04-27 (×5): qty 250

## 2012-04-27 MED ORDER — ADULT MULTIVITAMIN LIQUID CH
5.0000 mL | Freq: Every day | ORAL | Status: DC
  Administered 2012-04-27 – 2012-05-26 (×22): 5 mL
  Filled 2012-04-27 (×31): qty 5

## 2012-04-27 MED ORDER — VITAL 1.5 CAL PO LIQD
1000.0000 mL | ORAL | Status: DC
  Administered 2012-04-27 – 2012-04-28 (×2): 1000 mL
  Filled 2012-04-27 (×5): qty 1000

## 2012-04-27 MED ORDER — PROPOFOL 10 MG/ML IV EMUL
INTRAVENOUS | Status: AC
  Filled 2012-04-27: qty 100

## 2012-04-27 MED ORDER — DILTIAZEM LOAD VIA INFUSION
10.0000 mg | Freq: Once | INTRAVENOUS | Status: DC
  Filled 2012-04-27: qty 10

## 2012-04-27 NOTE — Progress Notes (Signed)
Subjective: Patient intubated. Objective: Filed Vitals:   04/27/12 0400 04/27/12 0500 04/27/12 0600 04/27/12 0700  BP: 107/71 104/66 124/71 114/72  Pulse: 86 79 91 87  Temp: 99.5 F (37.5 C)     TempSrc: Oral     Resp: 18 19 19 17   Height:      Weight: 209 lb 10.5 oz (95.1 kg)     SpO2: 92% 93% 92% 92%   Weight change: 3 lb 8.4 oz (1.6 kg)  Intake/Output Summary (Last 24 hours) at 04/27/12 0805 Last data filed at 04/27/12 0700  Gross per 24 hour  Intake 3585.88 ml  Output   1765 ml  Net 1820.88 ml    General: Intubated, sedated. Neck:  JVP is normal Heart: Regular rate and rhythm, without murmurs, rubs, gallops.  Lungs: Clear to auscultation.  No rales or wheezes. Exemities:  No edema.    Tele:  SR> Lab Results: Results for orders placed during the hospital encounter of 04/12/12 (from the past 24 hour(s))  POCT I-STAT 3, BLOOD GAS (G3+)     Status: Abnormal   Collection Time   04/26/12  8:54 AM      Component Value Range   pH, Arterial 7.387  7.350 - 7.450   pCO2 arterial 54.8 (*) 35.0 - 45.0 mmHg   pO2, Arterial 56.0 (*) 80.0 - 100.0 mmHg   Bicarbonate 32.9 (*) 20.0 - 24.0 mEq/L   TCO2 35  0 - 100 mmol/L   O2 Saturation 88.0     Acid-Base Excess 6.0 (*) 0.0 - 2.0 mmol/L   Patient temperature 98.6 F     Collection site RADIAL, Cordaryl'S TEST ACCEPTABLE     Drawn by RT     Sample type ARTERIAL    HEPARIN LEVEL (UNFRACTIONATED)     Status: Abnormal   Collection Time   04/26/12 11:50 AM      Component Value Range   Heparin Unfractionated <0.10 (*) 0.30 - 0.70 IU/mL  GLUCOSE, CAPILLARY     Status: Abnormal   Collection Time   04/26/12 11:58 AM      Component Value Range   Glucose-Capillary 189 (*) 70 - 99 mg/dL   Comment 1 Notify RN    HEPARIN LEVEL (UNFRACTIONATED)     Status: Abnormal   Collection Time   04/26/12  1:50 PM      Component Value Range   Heparin Unfractionated 0.17 (*) 0.30 - 0.70 IU/mL  GLUCOSE, CAPILLARY     Status: Abnormal   Collection Time     04/26/12  4:56 PM      Component Value Range   Glucose-Capillary 187 (*) 70 - 99 mg/dL  GLUCOSE, CAPILLARY     Status: Abnormal   Collection Time   04/26/12  7:19 PM      Component Value Range   Glucose-Capillary 176 (*) 70 - 99 mg/dL  HEPARIN LEVEL (UNFRACTIONATED)     Status: Abnormal   Collection Time   04/26/12  8:15 PM      Component Value Range   Heparin Unfractionated 0.23 (*) 0.30 - 0.70 IU/mL  GLUCOSE, CAPILLARY     Status: Abnormal   Collection Time   04/26/12 11:58 PM      Component Value Range   Glucose-Capillary 149 (*) 70 - 99 mg/dL  PRO B NATRIURETIC PEPTIDE     Status: Abnormal   Collection Time   04/27/12  3:44 AM      Component Value Range   Pro B Natriuretic peptide (  BNP) 241.4 (*) 0 - 125 pg/mL  GLUCOSE, CAPILLARY     Status: Abnormal   Collection Time   04/27/12  3:46 AM      Component Value Range   Glucose-Capillary 134 (*) 70 - 99 mg/dL  PHOSPHORUS     Status: Normal   Collection Time   04/27/12  3:48 AM      Component Value Range   Phosphorus 3.1  2.3 - 4.6 mg/dL  HEPARIN LEVEL (UNFRACTIONATED)     Status: Normal   Collection Time   04/27/12  3:48 AM      Component Value Range   Heparin Unfractionated 0.45  0.30 - 0.70 IU/mL  CBC     Status: Abnormal   Collection Time   04/27/12  3:48 AM      Component Value Range   WBC 13.3 (*) 4.0 - 10.5 K/uL   RBC 5.64  4.22 - 5.81 MIL/uL   Hemoglobin 17.1 (*) 13.0 - 17.0 g/dL   HCT 13.0 (*) 86.5 - 78.4 %   MCV 93.4  78.0 - 100.0 fL   MCH 30.3  26.0 - 34.0 pg   MCHC 32.4  30.0 - 36.0 g/dL   RDW 69.6 (*) 29.5 - 28.4 %   Platelets 122 (*) 150 - 400 K/uL  BASIC METABOLIC PANEL     Status: Abnormal   Collection Time   04/27/12  3:48 AM      Component Value Range   Sodium 137  135 - 145 mEq/L   Potassium 3.6  3.5 - 5.1 mEq/L   Chloride 100  96 - 112 mEq/L   CO2 32  19 - 32 mEq/L   Glucose, Bld 134 (*) 70 - 99 mg/dL   BUN 39 (*) 6 - 23 mg/dL   Creatinine, Ser 1.32  0.50 - 1.35 mg/dL   Calcium 8.6  8.4 - 44.0  mg/dL   GFR calc non Af Amer >90  >90 mL/min   GFR calc Af Amer >90  >90 mL/min  BLOOD GAS, ARTERIAL     Status: Abnormal   Collection Time   04/27/12  3:55 AM      Component Value Range   FIO2 0.60     Delivery systems VENTILATOR     Mode PRESSURE REGULATED VOLUME CONTROL     VT 580     Rate 16     Peep/cpap 8.0     pH, Arterial 7.382  7.350 - 7.450   pCO2 arterial 57.0 (*) 35.0 - 45.0 mmHg   pO2, Arterial 64.7 (*) 80.0 - 100.0 mmHg   Bicarbonate 33.2 (*) 20.0 - 24.0 mEq/L   TCO2 35.0  0 - 100 mmol/L   Acid-Base Excess 8.1 (*) 0.0 - 2.0 mmol/L   O2 Saturation 91.9     Patient temperature 98.3     Collection site RIGHT RADIAL     Drawn by [id number]     Sample type ARTERIAL DRAW     Allens test (pass/fail) PASS  PASS    Studies/Results: @[id number] @  Medications: Reviewed.   Patient Active Hospital Problem List:  1.  Atrial fibrillation  Maintaining SR  I would recomm stopping IV dilt  Follow HR  Keep on short acting PO dilt for now.  Contiue anticoagulation.  Now on heparin.  LOS: 15 days   Dietrich Pates 04/27/2012, 8:05 AM

## 2012-04-27 NOTE — Progress Notes (Signed)
PULMONARY  / CRITICAL CARE MEDICINE  Name: Cody [name] MRN: [id number] DOB: [birth year]    LOS: 15  REFERRING MD:   EDP - Dr. Preston Fleeting  CHIEF COMPLAINT:  Shortness of breath  BRIEF PATIENT DESCRIPTION: 62 yo M with COPD and 3ppd smoking admitted 04/12/2012 with acute respiratory failure 2/2 influenza A and aspiration PNA, required intubated 04/13/11 for airway protection and aspirated during intubation. Extubated 1/15 but reintubated emergently 1/18 for severe delerium & resp distress, purulent secretions noted  LINES / TUBES: ETT 1/5 >>1/15 , 1/18 >> L IJ 1/5 >> 1/13 R IJ 1/13 >> 1/16 RIJ 1/18 >>  R A-line 1/5 >>1/8 pulled out by pt   CULTURES: Blood culture 1/5>> Negative Resp culture 1/6>> H. influenzae  Resp viral panel 1/6>> Influenza A and Metapneumovirus  Sputum Leigonella Culture 1/6>> Negative Bronch Sputum 1/6>> H. influenzae  Urine 1/5>> Neg  Urine 1/6>> Neg  Flu 1/5>> Neg Blood culture 01/13 >> Catheter tip culture 1/13 >>Staph -coag neg-70 colonies(vanc sens)  C.diff PCR 1/14 >> Negative  ANTIBIOTICS: Zosyn 1/5>>1/9  Azithro 1/5>>1/8  Vanc 1/6>>>1/8 - then restarted 01/13 >>> Tamiflu 1/6>>>1/16  Rocephin 1/9>>1/15 Ceftazidine 1/18 >>   SIGNIFICANT EVENTS:  1/5 Intubated in ED  1/5 Shock , levophed  1/6 ECHO - 55%, mod dil rv, PA pressures>>> not measured b/c no TR jet  1/6 Bronched- diffuse pus all lobes  1/7 increasing pCO2, increased RR on vent  1/8 Changed sedation to Precedex, more agitated than with versed  1/9 Changed sedation back to Versed, agitation greatly improved  1/10 scheduled haldol  1/12 Restarting precedex gtt due to persistent agitation 1/13 Febrile to 102.6 overnight. ? Catheter infection? DC'ed L IJ, placed new R IJ. 1/14 No fevers overnight, agitation controlled on propofol (added 1/13), more interactive. 1/15 Had foul smelling stools overnight, therefore, C.diff PCR was sent by RN. 1/15 EXTUBATED, developed A.fib with RVR  overnight. 1/16 Persistent episodes of desaturation., afib>started on cardizem drip  1/17 increased agitation  1/18 reintubated  1/20 Persistent A. Fib this morning on dilt drip.  Rates controlled between 99-125  LEVEL OF CARE:  ICU PRIMARY SERVICE:  PCCM CONSULTANTS:  None CODE STATUS: Full DIET:  TF DVT Px:  Heparin GI Px:  protonix  SUBJECTIVE / INTERVAL HISTORY:  reintubated 1/18  , fio2 .60 overnight.  Follows commands on WUA , decreased agitation  On IV cardizem for RVR.  Attempted to convert him to PO dilt but continues to be in A. Fib.    VITAL SIGNS: Temp:  [97.4 F (36.3 C)-99.9 F (37.7 C)] 99.6 F (37.6 C) (01/20 0800) Pulse Rate:  [78-94] 92  (01/20 0903) Resp:  [16-23] 17  (01/20 0903) BP: (97-124)/(61-78) 104/61 mmHg (01/20 0903) SpO2:  [91 %-97 %] 93 % (01/20 0900) FiO2 (%):  [60 %-70 %] 60 % (01/20 0904) Weight:  [209 lb 10.5 oz (95.1 kg)] 209 lb 10.5 oz (95.1 kg) (01/20 0400) HEMODYNAMICS: VENTILATOR SETTINGS: Vent Mode:  [-] PRVC FiO2 (%):  [60 %-70 %] 60 % Set Rate:  [16 bmp] 16 bmp Vt Set:  [580 mL] 580 mL PEEP:  [5 cmH20-8 cmH20] 8 cmH20 Plateau Pressure:  [15 cmH20-21 cmH20] 21 cmH20 INTAKE / OUTPUT: Intake/Output      01/19 0701 - 01/20 0700 01/20 0701 - 01/21 0700   I.V. (mL/kg) 1715.9 (18) 62.4 (0.7)   NG/GT 1050 20   IV Piggyback 910    Total Intake(mL/kg) 3675.9 (38.7) 82.4 (0.9)   Urine (  mL/kg/hr) 1815 (0.8)    Total Output 1815    Net +1860.9 +82.4          PHYSICAL EXAMINATION: General: Sedated follows commands   Head: Normocephalic, atraumatic.  Lungs:  Diminished BS , no wheezing   Heart: Irregularly irregular rhythm, tachycardic.  Abdomen:  BS normoactive. Soft, Nondistended, non-tender.  No masses or organomegaly.  Extremities: No pretibial edema.    IMAGING: 1/19 CXR >Similar bibasilar volume loss with probable small left pleural  effusion.   DIAGNOSES: Principal Problem:  *Acute respiratory failure with  hypercapnia Active Problems:  HYPERLIPIDEMIA  TOBACCO USE  HYPERTENSION  GERD  COPD (chronic obstructive pulmonary disease)  Acute encephalopathy  Acute kidney injury  Atrial fibrillation   ASSESSMENT / PLAN:  PULMONARY  Lab 04/27/12 0355 04/26/12 0854 04/25/12 1226  PHART 7.382 7.387 7.350  PCO2ART 57.0* 54.8* 60.6*  PO2ART 64.7* 56.0* 130.0*  HCO3 33.2* 32.9* 33.5*  TCO2 35.0 35 35   A: 1) Acute hypercapnic respiratory failure/ARDS - secondary to flu, COPD exacerbation. Requiring increased FiO2 /PEEP 1/13 likely contributed by agitation. Extubated 1/15.>reintubate 1/18  2) Aspiration - at time of intubation, finished course of treatment , restarted Vanc 1/13 fever  3) Pneumonitis vs. CAP - b/l interstitial opacities on cxr, slightly increased at left, likely atelectasis. Completed 8/8 days rocephin , on tapering steroids  4) Pulmonary edema - worsening per CXR on 1/12 --> Lasix. To keep neg balance  4) Non-oxygen dependent COPD - h/o chronic tobacco abuse hx 3 pack per day 20+ years. On symbicort at home. Not on home O2 therapy. 5) Bloody secretions from aspiration - Pulm /renal syndrome ruled out.   P:  -Increase peep to 10, to goal 50% after this change - Duonebs q6h + q2h PRN. - decrease  Prednisone 30  mg daily taper as able (contributing to agitation)  -- Smoking cessation education will be needed. - Lasix 20mg  IV x 1 to keep neg bal as scr allows  -unable sbt today, would consider cpap 8, ps in am if peep to 8 overnight -may need trach  CARDIOVASCULAR  Lab 04/25/12 0903 04/25/12 0445 04/24/12 2148  CKTOTAL -- -- --  CKMB -- -- --  TROPONINI <0.30 <0.30 <0.30    Lab 04/27/12 0344  PROBNP 241.4*   A:  1) New onset atrial fibrillation with RVR - on cardizem and Hep  2) Elevated proBNP - ProBNP on admission 17911 --> 398 on 1/12. Trop x3 negative. ECHO with normal systolic and diastolic dysfunction, EF 50-55%.  3) HTN -   home regimen Lisinopril 40mg , Norvasc  5mg , and HCTZ 25mg  qd. -on hold  4) Hypotension - Resolved. Initially contributed by septic shock likely, previously requiring pressors. Resolved.   PLAN:  - taper Cardizem drip , add cardizem via tube -   - Goal HR < 120. -hold coumadin &  IV heparin until extubated, especially as likely to require trach -lasix 20mg  IV    RENAL  Lab 04/27/12 0348 04/26/12 0413 04/25/12 0445  NA 137 141 141  K 3.6 3.3* 3.5  CL 100 101 99  CO2 32 34* 33*  BUN 39* 41* 33*  CREATININE 0.74 0.77 0.65  GLUCOSE 134* 109* 136*   A:  1) Acute Kidney injury - Resolved. Previously likely pre-renal in setting of volume depletion. Previously urinary sediment clogging foley, improved.  2) Hypernatremia - resolved  3) Hypokalemia   P:  - kvo IVFs, even goal - Continue to monitor renal function. -  replace K+   GASTROINTESTINAL  Lab 04/24/12 2147 04/23/12 0400 04/22/12 0358  AST 26 25 19   ALT 25 24 21   ALKPHOS 72 68 59  BILITOT 1.5* 0.9 0.4  PROT 6.9 6.6 6.1  ALBUMIN 3.0* 2.8* 2.4*   A: cdiff neg 1) GERD 2) Diarrhea - resolving. C. Diff negative.  P: -resume TFs, if reoccurs diarrhea, then change to vital -ppi on vent  HEMATOLOGIC  Lab 04/27/12 0348 04/26/12 0413 04/25/12 0445 04/24/12 0530 04/23/12 2003  WBC 13.3* 14.1* 16.7* -- --  HGB 17.1* 17.7* 19.4* -- --  HCT 52.7* 55.5* 56.2* -- --  PLT 122* 122* 122* -- --  APTT -- -- -- -- --  INR -- -- 1.54* 0.97 0.90    A:  1) Polycythemia- Hgb stable.  2) Leukocytosis -  DC of left IJ on 1/13 (cath tip infection-staph/coag neg )  He remains on steroids.   P:  - hep drip , tolerated -no coumadin   INFECTIOUS No results found for this basename: LATICACIDVEN:3,PROCALCITON:3 in the last 168 hours 1) Flu pneumonia - Flu A and metapneumovirus >finished full course of Tamiflu , droplet isolation d/c 1/18 2) Aspirated at time of intubation, pneumonitis vs PNA>finished flu course of Rocephin  3) Nosocomial infection -  Left IJ (in 8 days) was  dc'ed on 1/13 and catheter tip cultures>staph/coag neg . Was resumed Vanc on 1/13 4) Diarrhea - C.diff neg. 5) Leukocytosis - remains afebrile, Ceftaz added 1/18 w/ sputum cx sent  P:   Continue ceftaz, for now, cosnider pct algo to limit abx duration Follow cx data  ENDOCRINE  Lab 04/27/12 0346 04/26/12 2358 04/26/12 1919 04/26/12 1656 04/26/12 1158  GLUCAP 134* 149* 176* 187* 189*   A: 1) On steroids - CBGs at goal. Lantus off P:  - Cont ICU Hyperglycemic protocol. - CBG monitoring qAC and HS.   NEUROLOGIC / PSYCHIATRIC   A:  1) Acute encephalopathy - Resolved. Likely secondary to hypoxia / hypercarbia and possible infectious process (CAP?). >returned 1/18 >requiring intubation  2) Delerium - returned 1/18 , improved w/ sedation propofol /fent  P:  - use propofol + fent as sedation -delirium, avoid benzo -add rispirdal,low threshold  Care during the described time interval was provided by me and/or other providers on the critical care team.  I have reviewed this patient's available data, including medical history, events of note, physical examination and test results as part of my evaluation   cc time x 30 min  [name]. [name], MD, FACP Pgr: [phone] [facility]

## 2012-04-27 NOTE — Progress Notes (Signed)
ANTICOAGULATION CONSULT NOTE - Follow Up  Pharmacy Consult for heparin Indication: atrial fibrillation  No Known Allergies  Patient Measurements: Height: 5\' 10"  (177.8 cm) Weight: 209 lb 10.5 oz (95.1 kg) IBW/kg (Calculated) : 73  Heparin Dosing Weight: 93kg  Vital Signs: Temp: 99.6 F (37.6 C) (01/20 0800) Temp src: Oral (01/20 0800) BP: 104/61 mmHg (01/20 0903) Pulse Rate: 92  (01/20 0903)  Labs:  Basename 04/27/12 1030 04/27/12 0348 04/26/12 2015 04/26/12 0413 04/25/12 0903 04/25/12 0445 04/24/12 2148  HGB -- 17.1* -- 17.7* -- -- --  HCT -- 52.7* -- 55.5* -- 56.2* --  PLT -- 122* -- 122* -- 122* --  APTT -- -- -- -- -- -- --  LABPROT -- -- -- -- -- 18.0* --  INR -- -- -- -- -- 1.54* --  HEPARINUNFRC 0.24* 0.45 0.23* -- -- -- --  CREATININE -- 0.74 -- 0.77 -- 0.65 --  CKTOTAL -- -- -- -- -- -- --  CKMB -- -- -- -- -- -- --  TROPONINI -- -- -- -- <0.30 <0.30 <0.30    Estimated Creatinine Clearance: 109.4 ml/min (by C-G formula based on Cr of 0.74).   Assessment: 62yo male  On heparin for afib and heparin level is below goal (HL= 0.24). Hg/hct= 17.1/52.7 and pltc= 122.  Goal of Therapy:  Heparin level 0.3-0.7 units/ml Monitor platelets by anticoagulation protocol: Yes   Plan:  -Increase heparin to 2300 units/hr -Heparin level in 6 hrs  Harland German, Pharm D 04/27/2012 11:30 AM

## 2012-04-27 NOTE — Progress Notes (Signed)
ANTICOAGULATION CONSULT NOTE - Follow Up Consult  Pharmacy Consult for heparin Indication: atrial fibrillation  Labs:  Basename 04/27/12 0348 04/26/12 2015 04/26/12 1350 04/26/12 0413 04/25/12 0903 04/25/12 0445 04/24/12 2148 04/24/12 2147 04/24/12 0530  HGB 17.1* -- -- 17.7* -- -- -- -- --  HCT 52.7* -- -- 55.5* -- 56.2* -- -- --  PLT 122* -- -- 122* -- 122* -- -- --  APTT -- -- -- -- -- -- -- -- --  LABPROT -- -- -- -- -- 18.0* -- -- 12.8  INR -- -- -- -- -- 1.54* -- -- 0.97  HEPARINUNFRC 0.45 0.23* 0.17* -- -- -- -- -- --  CREATININE -- -- -- 0.77 -- 0.65 -- 0.65 --  CKTOTAL -- -- -- -- -- -- -- -- --  CKMB -- -- -- -- -- -- -- -- --  TROPONINI -- -- -- -- <0.30 <0.30 <0.30 -- --    Assessment/Plan:  62yo male now therapeutic on heparin after multiple rate increases.  Will continue gtt at current rate and confirm stable with additional level.  Colleen Can PharmD BCPS 04/27/2012,4:16 AM

## 2012-04-27 NOTE — Progress Notes (Signed)
ANTICOAGULATION CONSULT NOTE - Follow Up  Pharmacy Consult for heparin Indication: atrial fibrillation  No Known Allergies Patient Measurements: Height: 5\' 10"  (177.8 cm) Weight: 209 lb 10.5 oz (95.1 kg) IBW/kg (Calculated) : 73  Heparin Dosing Weight: 93kg Vital Signs: Temp: 99.1 F (37.3 C) (01/20 1600) Temp src: Oral (01/20 1600) BP: 127/75 mmHg (01/20 1900) Pulse Rate: 96  (01/20 1900) Labs:  Basename 04/27/12 1830 04/27/12 1030 04/27/12 0348 04/26/12 0413 04/25/12 0903 04/25/12 0445 04/24/12 2148  HGB -- -- 17.1* 17.7* -- -- --  HCT -- -- 52.7* 55.5* -- 56.2* --  PLT -- -- 122* 122* -- 122* --  APTT -- -- -- -- -- -- --  LABPROT -- -- -- -- -- 18.0* --  INR -- -- -- -- -- 1.54* --  HEPARINUNFRC 0.27* 0.24* 0.45 -- -- -- --  CREATININE -- -- 0.74 0.77 -- 0.65 --  CKTOTAL -- -- -- -- -- -- --  CKMB -- -- -- -- -- -- --  TROPONINI -- -- -- -- <0.30 <0.30 <0.30   Estimated Creatinine Clearance: 109.4 ml/min (by C-G formula based on Cr of 0.74).  Assessment: 62yo male on IV heparin for afib and heparin level remains slightly below goal (HL= 0.27) despite prior increase. Hg/hct= 17.1/52.7 and pltc= 122 (stable). No bleeding or problems with infusion reported per RN.   Goal of Therapy:  Heparin level 0.3-0.7 units/ml Monitor platelets by anticoagulation protocol: Yes   Plan:  -Increase heparin to 2500 units/hr -Heparin level in 6 hrs   Link Snuffer, PharmD, BCPS Clinical Pharmacist [phone]  04/27/2012 7:46 PM

## 2012-04-27 NOTE — Progress Notes (Signed)
NUTRITION FOLLOW UP  Intervention:   1. D/c Oxepa 2. .Initiate Vital 1.5 @ 20 ml/hr via OG and increase by 10 ml every 4 hours to goal rate of 30 ml/hr. 60 ml Prostat TID.  At goal rate, tube feeding regimen + Propofol kcal will provide 1980 kcal, 139 grams of protein, and 550 ml of H2O.   2. Multivitamin per tube daily.  3. RD will continue to follow    Nutrition Dx:   Inadequate oral intake related critical illness as evidenced by NPO status, ongoin  Goal:   Intake to meet >/=90% estimated nutrition needs.   Monitor:   Vent status, weight trends, I/O's, TF tolerance  Assessment:   Remains intubated. Started on Oxepa over the weekend. Has been tolerating well.  Pt with hx of diarrhea, spoke with MD- to change to Vital for better tolerance.   Current TF Oxepa at goal rate of 20 ml/hr with 60 ml Prostat QID to provide 1520 kcal, 150 g protein, 377 ml free water. With additional kcal from Propofol drip.    Height: Ht Readings from Last 1 Encounters:  04/25/12 5\' 10"  (1.778 m)    Weight Status:   Wt Readings from Last 1 Encounters:  04/27/12 209 lb 10.5 oz (95.1 kg)  Trending down   Patient is currently intubated on ventilator support.  MV: 10.2 Temp:Temp (24hrs), Avg:99.5 F (37.5 C), Min:99.1 F (37.3 C), Max:99.9 F (37.7 C)  Propofol: 11.4 ml/hr providing 300 kcal from lipids daily.   Re-estimated needs:  Kcal: 2019  Protein: >/=140 gm Fluid: 2.2 L  Skin: Intact   Diet Order: NPO   Intake/Output Summary (Last 24 hours) at 04/27/12 1249 Last data filed at 04/27/12 1200  Gross per 24 hour  Intake 3060.05 ml  Output   1765 ml  Net 1295.05 ml    Last BM: 1/16   Labs:   Lab 04/27/12 0348 04/26/12 0413 04/25/12 0445 04/24/12 2147 04/23/12 0400  NA 137 141 141 -- --  K 3.6 3.3* 3.5 -- --  CL 100 101 99 -- --  CO2 32 34* 33* -- --  BUN 39* 41* 33* -- --  CREATININE 0.74 0.77 0.65 -- --  CALCIUM 8.6 8.9 9.1 -- --  MG -- -- 1.9 1.9 2.3  PHOS 3.1 4.3  3.2 -- --  GLUCOSE 134* 109* 136* -- --    CBG (last 3)   Basename 04/27/12 0346 04/26/12 2358 04/26/12 1919  GLUCAP 134* 149* 176*    Scheduled Meds:   . antiseptic oral rinse  15 mL Mouth Rinse QID  . cefTAZidime (FORTAZ)  IV  1 g Intravenous Q8H  . chlorhexidine  15 mL Mouth Rinse BID  . diltiazem  10 mg Intravenous Once  . feeding supplement  60 mL Per Tube QID  . insulin aspart  0-9 Units Subcutaneous Q4H  . ipratropium  0.5 mg Nebulization Q6H  . levalbuterol  0.63 mg Nebulization Q6H  . pantoprazole (PROTONIX) IV  40 mg Intravenous QHS  . predniSONE  30 mg Per Tube Q breakfast  . vancomycin  1,000 mg Intravenous Q8H    Continuous Infusions:   . sodium chloride 10 mL/hr at 04/25/12 2000  . sodium chloride    . diltiazem (CARDIZEM) infusion 10 mg/hr (04/27/12 1154)  . feeding supplement (OXEPA) 1,000 mL (04/26/12 1932)  . fentaNYL infusion INTRAVENOUS 100 mcg/hr (04/27/12 0149)  . heparin 2,300 Units/hr (04/27/12 1154)  . propofol 20 mcg/kg/min (04/27/12 1232)  Orson Slick RD, LDN Pager [phone] After Hours pager [phone]

## 2012-04-27 NOTE — Progress Notes (Signed)
Physical Therapy Discharge Patient Details Name: Cody [name] MRN: [id number] DOB: Feb 23, 1951 Today's Date: 04/27/2012 Time:  -     Patient discharged from PT services secondary to medical decline - will need to re-order PT to resume therapy services.  Please see latest therapy progress note for current level of functioning and progress toward goals.    Progress and discharge plan discussed with patient and/or caregiver: Patient unable to participate in discharge planning and no caregivers available  GP     Eriel Doyon Pager [phone]

## 2012-04-28 ENCOUNTER — Inpatient Hospital Stay (HOSPITAL_COMMUNITY): Payer: Medicaid Other

## 2012-04-28 LAB — TRIGLYCERIDES: Triglycerides: 175 mg/dL — ABNORMAL HIGH (ref <150)

## 2012-04-28 LAB — CBC
MCHC: 33.7 g/dL (ref 30.0–36.0)
RDW: 17 % — ABNORMAL HIGH (ref 11.5–15.5)
WBC: 13.2 10*3/uL — ABNORMAL HIGH (ref 4.0–10.5)

## 2012-04-28 LAB — POCT I-STAT 3, ART BLOOD GAS (G3+)
Acid-Base Excess: 8 mmol/L — ABNORMAL HIGH (ref 0.0–2.0)
O2 Saturation: 94 %
TCO2: 39 mmol/L (ref 0–100)
pCO2 arterial: 66.9 mmHg (ref 35.0–45.0)
pO2, Arterial: 78 mmHg — ABNORMAL LOW (ref 80.0–100.0)

## 2012-04-28 LAB — PROTIME-INR: Prothrombin Time: 13.5 seconds (ref 11.6–15.2)

## 2012-04-28 LAB — CULTURE, RESPIRATORY W GRAM STAIN

## 2012-04-28 LAB — GLUCOSE, CAPILLARY
Glucose-Capillary: 164 mg/dL — ABNORMAL HIGH (ref 70–99)
Glucose-Capillary: 178 mg/dL — ABNORMAL HIGH (ref 70–99)
Glucose-Capillary: 155 mg/dL — ABNORMAL HIGH (ref 70–99)

## 2012-04-28 LAB — BASIC METABOLIC PANEL
Chloride: 102 mEq/L (ref 96–112)
GFR calc Af Amer: >90 mL/min (ref >90)
GFR calc non Af Amer: >90 mL/min (ref >90)
Potassium: 3.6 mEq/L (ref 3.5–5.1)
Sodium: 139 mEq/L (ref 135–145)

## 2012-04-28 LAB — HEPARIN LEVEL (UNFRACTIONATED): Heparin Unfractionated: 0.26 IU/mL — ABNORMAL LOW (ref 0.30–0.70)

## 2012-04-28 LAB — PHOSPHORUS: Phosphorus: 2.7 mg/dL (ref 2.3–4.6)

## 2012-04-28 MED ORDER — SODIUM CHLORIDE 0.9 % IV BOLUS (SEPSIS): 500.0000 mL | Freq: Once | INTRAVENOUS | Status: DC

## 2012-04-28 MED ORDER — RISPERIDONE 1 MG/ML PO SOLN
1.0000 mg | Freq: Two times a day (BID) | ORAL | Status: DC
  Administered 2012-04-28 – 2012-04-29 (×3): 1 mg
  Filled 2012-04-28 (×4): qty 1

## 2012-04-28 MED ORDER — FENTANYL CITRATE 0.05 MG/ML IJ SOLN: 200.0000 ug | Freq: Once | INTRAMUSCULAR | Status: DC

## 2012-04-28 MED ORDER — POTASSIUM CHLORIDE 20 MEQ/15ML (10%) PO LIQD
40.0000 meq | Freq: Every day | ORAL | Status: DC
  Administered 2012-04-28 – 2012-04-29 (×2): 40 meq via ORAL
  Filled 2012-04-28 (×2): qty 30

## 2012-04-28 MED ORDER — FUROSEMIDE 10 MG/ML IJ SOLN
20.0000 mg | Freq: Every day | INTRAMUSCULAR | Status: DC
  Administered 2012-04-29: 20 mg via INTRAVENOUS
  Filled 2012-04-28: qty 2

## 2012-04-28 MED ORDER — VECURONIUM BROMIDE 10 MG IV SOLR
10.0000 mg | Freq: Once | INTRAVENOUS | Status: DC
  Filled 2012-04-28: qty 10

## 2012-04-28 MED ORDER — FENTANYL BOLUS VIA INFUSION
200.0000 ug | Freq: Once | INTRAVENOUS | Status: AC
  Administered 2012-04-28: 200 ug via INTRAVENOUS
  Filled 2012-04-28: qty 200

## 2012-04-28 MED ORDER — SUCCINYLCHOLINE CHLORIDE 20 MG/ML IJ SOLN
INTRAMUSCULAR | Status: AC
  Filled 2012-04-28: qty 1

## 2012-04-28 MED ORDER — ROCURONIUM BROMIDE 50 MG/5ML IV SOLN
50.0000 mg | Freq: Once | INTRAVENOUS | Status: AC
  Administered 2012-04-28: 50 mg via INTRAVENOUS
  Administered 2012-04-29: 60 mg via INTRAVENOUS
  Filled 2012-04-28: qty 5

## 2012-04-28 MED ORDER — FUROSEMIDE 10 MG/ML IJ SOLN
40.0000 mg | Freq: Once | INTRAMUSCULAR | Status: AC
  Administered 2012-04-28: 40 mg via INTRAVENOUS
  Filled 2012-04-28: qty 4

## 2012-04-28 MED ORDER — VECURONIUM BROMIDE 10 MG IV SOLR
10.0000 mg | Freq: Once | INTRAVENOUS | Status: AC
  Administered 2012-04-29: 6 mg via INTRAVENOUS
  Filled 2012-04-28: qty 10

## 2012-04-28 MED ORDER — GERHARDT'S BUTT CREAM
TOPICAL_CREAM | CUTANEOUS | Status: DC | PRN
  Administered 2012-04-29 – 2012-04-30 (×2): 1 via TOPICAL
  Administered 2012-05-12 – 2012-05-18 (×8): via TOPICAL
  Filled 2012-04-28 (×2): qty 1

## 2012-04-28 MED ORDER — PROPOFOL 10 MG/ML IV BOLUS
150.0000 mg | Freq: Once | INTRAVENOUS | Status: AC
  Administered 2012-04-28: 150 mg via INTRAVENOUS
  Filled 2012-04-28: qty 20

## 2012-04-28 MED ORDER — MIDAZOLAM HCL 2 MG/2ML IJ SOLN
4.0000 mg | Freq: Once | INTRAMUSCULAR | Status: AC
  Administered 2012-04-29: 3 mg via INTRAVENOUS
  Filled 2012-04-28: qty 4

## 2012-04-28 MED ORDER — ACETAMINOPHEN 160 MG/5ML PO SOLN
650.0000 mg | Freq: Four times a day (QID) | ORAL | Status: DC | PRN
Start: 2012-04-28 — End: 2012-04-29
  Administered 2012-04-28: 650 mg
  Filled 2012-04-28: qty 20.3

## 2012-04-28 MED ORDER — PROPOFOL 10 MG/ML IV EMUL
5.0000 ug/kg/min | Freq: Once | INTRAVENOUS | Status: AC
  Administered 2012-04-28: 50 ug/kg/min via INTRAVENOUS

## 2012-04-28 MED ORDER — ETOMIDATE 2 MG/ML IV SOLN
INTRAVENOUS | Status: AC
  Filled 2012-04-28: qty 20

## 2012-04-28 MED ORDER — ETOMIDATE 2 MG/ML IV SOLN
40.0000 mg | Freq: Once | INTRAVENOUS | Status: DC
  Filled 2012-04-28: qty 20

## 2012-04-28 MED ORDER — ROCURONIUM BROMIDE 50 MG/5ML IV SOLN
INTRAVENOUS | Status: AC
  Filled 2012-04-28: qty 2

## 2012-04-28 MED ORDER — LIDOCAINE HCL (CARDIAC) 20 MG/ML IV SOLN
INTRAVENOUS | Status: AC
  Filled 2012-04-28: qty 5

## 2012-04-28 NOTE — Progress Notes (Signed)
CONSULT NOTE - Follow Up  Pharmacy Consult for heparin and vancomycin Indication: atrial fibrillation and HCAP  No Known Allergies Patient Measurements: Height: 5\' 10"  (177.8 cm) Weight: 212 lb 11.9 oz (96.5 kg) IBW/kg (Calculated) : 73  Heparin Dosing Weight: 93kg Vital Signs: Temp: 98.9 F (37.2 C) (01/21 2000) Temp src: Oral (01/21 2000) BP: 106/67 mmHg (01/21 2000) Pulse Rate: 77  (01/21 2000) Labs:  Basename 04/28/12 1930 04/28/12 0455 04/28/12 0157 04/27/12 1830 04/27/12 0348 04/26/12 0413  HGB -- 16.3 -- -- 17.1* --  HCT -- 48.4 -- -- 52.7* 55.5*  PLT -- 143* -- -- 122* 122*  APTT -- -- -- -- -- --  LABPROT -- -- 13.5 -- -- --  INR -- -- 1.04 -- -- --  HEPARINUNFRC 0.53 -- 0.26* 0.27* -- --  CREATININE -- 0.64 -- -- 0.74 0.77  CKTOTAL -- -- -- -- -- --  CKMB -- -- -- -- -- --  TROPONINI -- -- -- -- -- --   Estimated Creatinine Clearance: 110.2 ml/min (by C-G formula based on Cr of 0.64).  Assessment: 62yo male on IV heparin for afib.  Heparin level is 0.53 which is therapeutic after heparin drip was  resumed at 12 noon at 2700 units/hr.  Heparin drip to be turned off at 0500 am on 1/22 for trach.  He has been on vancomycin since 04/20/12 and ceftazidime since 1/18.  Total abx day # 16.   Steady-state vancomycin trough tonight is 16.6 mcg/ml which is within the desired goal of 15-20 mcg/ml for HCAP.  WBC 13.3. Afebrile.    Goal of Therapy:  Heparin level 0.3-0.7 units/ml Monitor platelets by anticoagulation protocol: Yes Vancomycin trough 15-20 mcg/ml for HCAP   Plan:  1. Continue heparin at 2700 units/hr - will be off at 0500 am for trach placement 2. Continue vancomycin 1000 mg IV q8h and ceftazidime 1 gm IV q8h Herby Abraham, Pharm.D. [phone] 04/28/2012 9:20 PM

## 2012-04-28 NOTE — Progress Notes (Signed)
OT Cancellation Note  Patient Details Name: DIARRA KOS MRN: [id number] DOB: Oct 30, 1950   Cancelled Treatment:    Reason Eval/Treat Not Completed: Medical issues which prohibited therapy (decline in medical status) OT signing off. Please reorder when appropriate.  [facility] Anjulie Dipierro, OTR/L  [phone] 04/28/2012 04/28/2012, 9:07 AM

## 2012-04-28 NOTE — Progress Notes (Signed)
PULMONARY  / CRITICAL CARE MEDICINE  Name: Cody [name] MRN: [id number] DOB: Jun 01, 1950    LOS: 16  REFERRING MD:   EDP - Dr. Preston Fleeting  CHIEF COMPLAINT:  Shortness of breath  BRIEF PATIENT DESCRIPTION: 62 yo M with COPD and 3ppd smoking admitted 04/12/2012 with acute respiratory failure 2/2 influenza A and aspiration PNA, required intubated 04/13/11 for airway protection and aspirated during intubation. Extubated 1/15 but reintubated emergently 1/18 for severe delerium & resp distress, purulent secretions noted  LINES / TUBES: ETT 1/5 >>1/15 , 1/18 >> self extubated 1/21 >>> reintubated 1/21 L IJ 1/5 >> 1/13 R IJ 1/13 >> 1/16 RIJ 1/18 >>  R A-line 1/5 >>1/8 pulled out by pt   CULTURES: Blood culture 1/5>> Negative Resp culture 1/6>> H. influenzae  Resp viral panel 1/6>> Influenza A and Metapneumovirus  Sputum Leigonella Culture 1/6>> Negative Bronch Sputum 1/6>> H. influenzae  Urine 1/5>> Neg  Urine 1/6>> Neg  Flu 1/5>> Neg Blood culture 01/13 >> NG Catheter tip culture 1/13 >>Staph -coag neg-70 colonies(vanc sens)  C.diff PCR 1/14 >> Negative Resp culture 1/18 >>> Negative  ANTIBIOTICS: Zosyn 1/5>>1/9  Azithro 1/5>>1/8  Vanc 1/6>>>1/8 - then restarted 01/13 >>> Tamiflu 1/6>>>1/16  Rocephin 1/9>>1/15 Ceftazidine 1/18 >>   SIGNIFICANT EVENTS:  1/5 Intubated in ED  1/5 Shock , levophed  1/6 ECHO - 55%, mod dil rv, PA pressures>>> not measured b/c no TR jet  1/6 Bronched- diffuse pus all lobes  1/7 increasing pCO2, increased RR on vent  1/8 Changed sedation to Precedex, more agitated than with versed  1/9 Changed sedation back to Versed, agitation greatly improved  1/10 scheduled haldol  1/12 Restarting precedex gtt due to persistent agitation 1/13 Febrile to 102.6 overnight. ? Catheter infection? DC'ed L IJ, placed new R IJ. 1/14 No fevers overnight, agitation controlled on propofol (added 1/13), more interactive. 1/15 Had foul smelling stools overnight, therefore,  C.diff PCR was sent by RN. 1/15 EXTUBATED, developed A.fib with RVR overnight. 1/16 Persistent episodes of desaturation., afib>started on cardizem drip  1/17 increased agitation  1/18 reintubated  1/20 Persistent A. Fib this morning on dilt drip.  Rates controlled between 99-125 1/21 Self extubated, given a chance and continued to be in respiratory distress so reintubated.   LEVEL OF CARE:  ICU PRIMARY SERVICE:  PCCM CONSULTANTS:  None CODE STATUS: Full DIET:  TF DVT Px:  Heparin GI Px:  protonix  SUBJECTIVE / INTERVAL HISTORY:  Self extubated this Am.  Was on 60% and 10 of PEEP.  Given NRB and sats were okay but with increased WOB and diaphoresis he was reintubated.  HR was controlled overnight but with the extubation and reintubation had some a. Fib with RVR.    VITAL SIGNS: Temp:  [98.6 F (37 C)-99.7 F (37.6 C)] 99.5 F (37.5 C) (01/21 0800) Pulse Rate:  [70-160] 100  (01/21 1133) Resp:  [15-20] 18  (01/21 1133) BP: (78-135)/(51-89) 78/51 mmHg (01/21 1133) SpO2:  [89 %-96 %] 94 % (01/21 1000) FiO2 (%):  [50 %-60 %] 60 % (01/21 1000) Weight:  [212 lb 11.9 oz (96.5 kg)] 212 lb 11.9 oz (96.5 kg) (01/21 0500) HEMODYNAMICS: VENTILATOR SETTINGS: Vent Mode:  [-] PRVC FiO2 (%):  [50 %-60 %] 60 % Set Rate:  [16 bmp] 16 bmp Vt Set:  [580 mL] 580 mL PEEP:  [10 cmH20] 10 cmH20 Plateau Pressure:  [18 cmH20-21 cmH20] 20 cmH20 INTAKE / OUTPUT: Intake/Output      01/20 0701 - 01/21  0700 01/21 0701 - 01/22 0700   I.V. (mL/kg) 1645.1 (17) 189.3 (2)   Other 135    NG/GT 940 120   IV Piggyback 500    Total Intake(mL/kg) 3220.1 (33.4) 309.3 (3.2)   Urine (mL/kg/hr) 1350 (0.6) 250 (0.6)   Total Output 1350 250   Net +1870.1 +59.3         PHYSICAL EXAMINATION: General: Sedated and intubated   Head: Normocephalic, atraumatic.  Lungs:  Diminished BS, course rhonchi.    Heart: Irregularly irregular rhythm, tachycardic.  Abdomen:  BS normoactive. Soft, Nondistended, non-tender.  No  masses or organomegaly.  Extremities: No pretibial edema.    IMAGING: 1/19 CXR >Similar bibasilar volume loss with probable small left pleural  effusion.   DIAGNOSES: Principal Problem:  *Acute respiratory failure with hypercapnia Active Problems:  HYPERLIPIDEMIA  TOBACCO USE  HYPERTENSION  GERD  COPD (chronic obstructive pulmonary disease)  Acute encephalopathy  Acute kidney injury  Atrial fibrillation  ASSESSMENT / PLAN:  PULMONARY  Lab 04/27/12 0355 04/26/12 0854 04/25/12 1226  PHART 7.382 7.387 7.350  PCO2ART 57.0* 54.8* 60.6*  PO2ART 64.7* 56.0* 130.0*  HCO3 33.2* 32.9* 33.5*  TCO2 35.0 35 35   A: 1) Acute hypercapnic respiratory failure/ARDS - secondary to flu, COPD exacerbation. Requiring increased FiO2 /PEEP 1/13 likely contributed by agitation. Extubated 1/15.>reintubate 1/18 self extubated 1/21 > reintubated 1/21 2) Aspiration - at time of intubation, finished course of treatment , restarted Vanc 1/13 fever  3) Pneumonitis vs. CAP - b/l interstitial opacities on cxr, slightly increased at left, likely atelectasis. Completed 8/8 days rocephin , on tapering steroids  4) Pulmonary edema - will likely need negative balance today.  Up almost 1.8L since the day before.  4) Non-oxygen dependent COPD - h/o chronic tobacco abuse hx 3 pack per day 20+ years. On symbicort at home. Not on home O2 therapy. 5) Bloody secretions from aspiration - Pulm /renal syndrome ruled out.   P:  - recheck ABG following reintubation, adjust vent back to prior baseline setting -Increase peep to 10, to goal 50% after this change - Duonebs q6h + q2h PRN. - decrease  Prednisone 30  mg daily taper as able (contributing to agitation)  -- Smoking cessation education will be needed. - Lasix 40 mg IV to keep neg bal as scr allows  - Trach likely tomorrow AM.   CARDIOVASCULAR  Lab 04/25/12 0903 04/25/12 0445 04/24/12 2148  CKTOTAL -- -- --  CKMB -- -- --  TROPONINI <0.30 <0.30 <0.30     Lab 04/27/12 0344  PROBNP 241.4*   A:  1) New onset atrial fibrillation with RVR - on cardizem PO and Hep  2) Elevated proBNP - ProBNP on admission 17911 --> 398 on 1/12. Trop x3 negative. ECHO with normal systolic and diastolic dysfunction, EF 50-55%.  3) HTN -   home regimen Lisinopril 40mg , Norvasc 5mg , and HCTZ 25mg  qd. -on hold  4) Hypotension - Resolved. Initially contributed by septic shock likely, previously requiring pressors. Resolved.   PLAN:  - PO Cardizem  - Goal HR < 120. - on IV heparin until extubated, especially as likely to require trach, dc at 5 am for procedure - lasix 40 mg IV today (1/21)  RENAL  Lab 04/28/12 0455 04/27/12 0348 04/26/12 0413  NA 139 137 141  K 3.6 3.6 3.3*  CL 102 100 101  CO2 31 32 34*  BUN 30* 39* 41*  CREATININE 0.64 0.74 0.77  GLUCOSE 176* 134* 109*  A:  1) Acute Kidney injury - Resolved. Previously likely pre-renal in setting of volume depletion. Previously urinary sediment clogging foley, improved.  2) Hypernatremia - resolved  3) Hypokalemia   P:  - continue kvo IVFs, goal to keep even.  Up over 1.8L in the last 48 hours.  - KCl 40 mEq PO x1 today with diuresis.   - Continue to monitor renal function.  GASTROINTESTINAL  Lab 04/24/12 2147 04/23/12 0400 04/22/12 0358  AST 26 25 19   ALT 25 24 21   ALKPHOS 72 68 59  BILITOT 1.5* 0.9 0.4  PROT 6.9 6.6 6.1  ALBUMIN 3.0* 2.8* 2.4*   A: cdiff neg 1) GERD 2) Diarrhea - resolving. C. Diff negative.  P: -resume TFs on Vital.  -ppi on vent -likely to require peg  HEMATOLOGIC  Lab 04/28/12 0455 04/28/12 0157 04/27/12 0348 04/26/12 0413 04/25/12 0445 04/24/12 0530  WBC 13.2* -- 13.3* 14.1* -- --  HGB 16.3 -- 17.1* 17.7* -- --  HCT 48.4 -- 52.7* 55.5* -- --  PLT 143* -- 122* 122* -- --  APTT -- -- -- -- -- --  INR -- 1.04 -- -- 1.54* 0.97    A:  1) Polycythemia- Hgb stable.  2) Leukocytosis -  DC of left IJ on 1/13 (cath tip infection-staph/coag neg )  He remains  on steroids.   P:  - hep drip , tolerated, hold 5 am  -no coumadin  inr wnl  INFECTIOUS  Lab 04/28/12 0455 04/27/12 1230  LATICACIDVEN -- --  PROCALCITON 0.15 0.14   1) Flu pneumonia - Flu A and metapneumovirus >finished full course of Tamiflu , droplet isolation d/c 1/18 2) Aspirated at time of intubation, pneumonitis vs PNA>finished flu course of Rocephin  3) Nosocomial infection -  Left IJ (in 8 days) was dc'ed on 1/13 and catheter tip cultures>staph/coag neg . Was resumed Vanc on 1/13 4) Diarrhea - C.diff neg. 5) Leukocytosis - remains afebrile, Ceftaz added 1/18 w/ sputum cx negative.  Treading downward.  Procalcitonin 0.14 --> 0.15. May need to consider coverage for aspiration following reintubation today and aspiration seen.   P:  - Consider coverage for aspiration, prolong duration after todays events.  ENDOCRINE  Lab 04/28/12 0809 04/28/12 0342 04/27/12 2324 04/27/12 1923 04/27/12 1658  GLUCAP 164* 149* 159* 195* 218*   A: 1) On steroids - CBGs at goal. Lantus off P:  - Cont ICU Hyperglycemic protocol. - CBG monitoring qAC and HS. -steroids reduction  NEUROLOGIC / PSYCHIATRIC   A:  1) Acute encephalopathy - Resolved. Likely secondary to hypoxia / hypercarbia and possible infectious process (CAP?). >returned 1/18 >requiring intubation  2) Delerium - returned 1/18 , improved w/ sedation propofol /fent  P:  - use propofol + fent as sedation -delirium, avoid benzo -add Risperdal 1 q12h  PRIBULA,CHRISTOPHER, MD Internal Medicine Resident, PGY III Co-Chief Resident, Internal Medicine Pager: [phone] 04/28/2012 12:09 PM   Care during the described time interval was provided by me and/or other providers on the critical care team.  I have reviewed this patient's available data, including medical history, events of note, physical examination and test results as part of my evaluation   cc time x  40 min  I updated son I have fully examined this patient and agree  with above findings.    And edited inf ull  [name]. [name], MD, FACP Pgr: [phone] [facility]'

## 2012-04-28 NOTE — Progress Notes (Signed)
eLink Physician-Brief Progress Note Patient Name: Cody [name] DOB: [birth year] MRN: [id number]  Date of Service  04/28/2012   HPI/Events of Note  Patient is beginning to stool and causing local irritation to buttocks/perineal area.   eICU Interventions  Plan: Order for Gerhardt's butt cream as needed to affected area prn   Intervention Category Minor Interventions: Routine modifications to care plan (e.g. PRN medications for pain, fever)  Keithan Dileonardo 04/28/2012, 11:45 PM

## 2012-04-28 NOTE — Procedures (Signed)
Intubation Procedure Note Cody [name] [id number] 1951-02-06  Procedure: Intubation Indications: Airway protection and maintenance  Procedure Details Consent: Unable to obtain consent because of altered level of consciousness. Time Out: Verified patient identification, verified procedure, site/side was marked, verified correct patient position, special equipment/implants available, medications/allergies/relevent history reviewed, required imaging and test results available.  Performed  MAC and 4 blade was used with direct visualization of the cords.  An 8 french tube was placed.  Stomach contents were visualized in the oropharynx and suctioned prior to placement.  Placement was confirmed by EZcap and ascultation.    Evaluation Hemodynamic Status: Transient hypotension treated with fluid; O2 sats: stable throughout Patient's Current Condition: stable Complications: Complications of Aspiration visualized during intubation Patient did tolerate procedure well. Chest X-ray ordered to verify placement.  CXR: pending.  PRIBULA,CHRISTOPHER, MD Internal Medicine Resident, PGY III Co-Chief Resident, Internal Medicine Pager: [phone] 04/28/2012 11:29 AM   Distress confusion, tachy  Cody [name]. Cody Alias, MD, FACP Pgr: [phone] [facility]

## 2012-04-28 NOTE — Progress Notes (Signed)
ANTICOAGULATION CONSULT NOTE - Follow Up  Pharmacy Consult for heparin Indication: atrial fibrillation  No Known Allergies Patient Measurements: Height: 5\' 10"  (177.8 cm) Weight: 209 lb 10.5 oz (95.1 kg) IBW/kg (Calculated) : 73  Heparin Dosing Weight: 93kg Vital Signs: Temp: 98.6 F (37 C) (01/21 0000) Temp src: Oral (01/21 0000) BP: 116/83 mmHg (01/21 0200) Pulse Rate: 94  (01/21 0200) Labs:  Basename 04/28/12 0157 04/27/12 1830 04/27/12 1030 04/27/12 0348 04/26/12 0413 04/25/12 0903 04/25/12 0445  HGB -- -- -- 17.1* 17.7* -- --  HCT -- -- -- 52.7* 55.5* -- 56.2*  PLT -- -- -- 122* 122* -- 122*  APTT -- -- -- -- -- -- --  LABPROT 13.5 -- -- -- -- -- 18.0*  INR 1.04 -- -- -- -- -- 1.54*  HEPARINUNFRC 0.26* 0.27* 0.24* -- -- -- --  CREATININE -- -- -- 0.74 0.77 -- 0.65  CKTOTAL -- -- -- -- -- -- --  CKMB -- -- -- -- -- -- --  TROPONINI -- -- -- -- -- <0.30 <0.30   Estimated Creatinine Clearance: 109.4 ml/min (by C-G formula based on Cr of 0.74).  Assessment: 62yo male on IV heparin for afib and heparin level remains slightly below goal (HL= 0.26) despite prior increase.  No bleeding or problems with infusion reported per RN.   Goal of Therapy:  Heparin level 0.3-0.7 units/ml Monitor platelets by anticoagulation protocol: Yes   Plan:  -Increase heparin to 2700 units/hr -Heparin level in 8 hrs   Talbert Cage, PharmD Clinical Pharmacist [phone]  04/28/2012 2:23 AM

## 2012-04-29 ENCOUNTER — Inpatient Hospital Stay (HOSPITAL_COMMUNITY): Payer: Medicaid Other

## 2012-04-29 DIAGNOSIS — IMO0002 Reserved for concepts with insufficient information to code with codable children: Secondary | ICD-10-CM

## 2012-04-29 LAB — CBC
HCT: 46.9 % (ref 39.0–52.0)
HCT: 52.8 % — ABNORMAL HIGH (ref 39.0–52.0)
Hemoglobin: 16.9 g/dL (ref 13.0–17.0)
MCH: 30.1 pg (ref 26.0–34.0)
MCH: 30.8 pg (ref 26.0–34.0)
MCV: 95.5 fL (ref 78.0–100.0)
RBC: 4.91 MIL/uL (ref 4.22–5.81)
RBC: 5.49 MIL/uL (ref 4.22–5.81)
RDW: 16.9 % — ABNORMAL HIGH (ref 11.5–15.5)
WBC: 11.3 10*3/uL — ABNORMAL HIGH (ref 4.0–10.5)

## 2012-04-29 LAB — ABO/RH: ABO/RH(D): O POS

## 2012-04-29 LAB — TYPE AND SCREEN: ABO/RH(D): O POS

## 2012-04-29 LAB — GLUCOSE, CAPILLARY
Glucose-Capillary: 105 mg/dL — ABNORMAL HIGH (ref 70–99)
Glucose-Capillary: 114 mg/dL — ABNORMAL HIGH (ref 70–99)
Glucose-Capillary: 190 mg/dL — ABNORMAL HIGH (ref 70–99)

## 2012-04-29 LAB — PROTIME-INR: Prothrombin Time: 12.7 seconds (ref 11.6–15.2)

## 2012-04-29 MED ORDER — NOREPINEPHRINE BITARTRATE 1 MG/ML IJ SOLN
2.0000 ug/min | INTRAVENOUS | Status: DC
  Filled 2012-04-29: qty 8

## 2012-04-29 MED ORDER — PROTAMINE SULFATE 10 MG/ML IV SOLN
25.0000 mg | Freq: Once | INTRAVENOUS | Status: AC
  Administered 2012-04-29: 25 mg via INTRAVENOUS
  Filled 2012-04-29: qty 2.5

## 2012-04-29 MED ORDER — DIPHENHYDRAMINE HCL 50 MG/ML IJ SOLN
50.0000 mg | Freq: Four times a day (QID) | INTRAMUSCULAR | Status: AC
  Administered 2012-04-29 – 2012-04-30 (×4): 50 mg via INTRAVENOUS
  Filled 2012-04-29 (×3): qty 1

## 2012-04-29 MED ORDER — ETOMIDATE 2 MG/ML IV SOLN
INTRAVENOUS | Status: AC
  Filled 2012-04-29: qty 20

## 2012-04-29 MED ORDER — PREDNISONE 5 MG/ML PO CONC
30.0000 mg | Freq: Every day | ORAL | Status: DC
  Filled 2012-04-29 (×2): qty 6

## 2012-04-29 MED ORDER — PREDNISONE 5 MG/ML PO CONC
20.0000 mg | Freq: Every day | ORAL | Status: DC
  Filled 2012-04-29 (×2): qty 4

## 2012-04-29 MED ORDER — LIDOCAINE HCL (CARDIAC) 20 MG/ML IV SOLN
INTRAVENOUS | Status: AC
  Filled 2012-04-29: qty 5

## 2012-04-29 MED ORDER — SODIUM CHLORIDE 0.9 % IV SOLN
INTRAVENOUS | Status: DC | PRN
  Administered 2012-05-03: 250 mL via INTRAVENOUS

## 2012-04-29 MED ORDER — DIPHENHYDRAMINE HCL 50 MG/ML IJ SOLN
INTRAMUSCULAR | Status: AC
  Filled 2012-04-29: qty 1

## 2012-04-29 MED ORDER — METHYLPREDNISOLONE SODIUM SUCC 125 MG IJ SOLR
80.0000 mg | Freq: Three times a day (TID) | INTRAMUSCULAR | Status: DC
  Administered 2012-04-29 – 2012-05-01 (×5): 80 mg via INTRAVENOUS
  Filled 2012-04-29 (×8): qty 1.28

## 2012-04-29 MED ORDER — QUETIAPINE FUMARATE 50 MG PO TABS
50.0000 mg | ORAL_TABLET | Freq: Two times a day (BID) | ORAL | Status: DC
  Filled 2012-04-29: qty 1

## 2012-04-29 MED ORDER — SODIUM CHLORIDE 0.9 % IV BOLUS (SEPSIS)
500.0000 mL | Freq: Once | INTRAVENOUS | Status: AC
  Administered 2012-04-29: 500 mL via INTRAVENOUS

## 2012-04-29 MED ORDER — ROCURONIUM BROMIDE 50 MG/5ML IV SOLN
1.0000 mg/kg | Freq: Once | INTRAVENOUS | Status: AC
  Administered 2012-04-29: 40 mg via INTRAVENOUS

## 2012-04-29 MED ORDER — DILTIAZEM 12 MG/ML ORAL SUSPENSION
60.0000 mg | Freq: Four times a day (QID) | ORAL | Status: DC
  Filled 2012-04-29 (×4): qty 6

## 2012-04-29 MED ORDER — PIPERACILLIN-TAZOBACTAM 3.375 G IVPB
3.3750 g | Freq: Three times a day (TID) | INTRAVENOUS | Status: DC
  Administered 2012-04-29 – 2012-05-06 (×21): 3.375 g via INTRAVENOUS
  Filled 2012-04-29 (×23): qty 50

## 2012-04-29 MED ORDER — SUCCINYLCHOLINE CHLORIDE 20 MG/ML IJ SOLN
INTRAMUSCULAR | Status: AC
  Filled 2012-04-29: qty 1

## 2012-04-29 MED ORDER — FAMOTIDINE IN NACL 20-0.9 MG/50ML-% IV SOLN
20.0000 mg | Freq: Two times a day (BID) | INTRAVENOUS | Status: DC
  Administered 2012-04-29 – 2012-05-05 (×14): 20 mg via INTRAVENOUS
  Filled 2012-04-29 (×17): qty 50

## 2012-04-29 MED ORDER — ROCURONIUM BROMIDE 50 MG/5ML IV SOLN
INTRAVENOUS | Status: AC
  Administered 2012-04-29: 60 mg via INTRAVENOUS
  Filled 2012-04-29: qty 2

## 2012-04-29 MED ORDER — METHYLPREDNISOLONE SODIUM SUCC 125 MG IJ SOLR
125.0000 mg | Freq: Once | INTRAMUSCULAR | Status: AC
  Administered 2012-04-29: 125 mg via INTRAVENOUS

## 2012-04-29 MED ORDER — ROCURONIUM BROMIDE 50 MG/5ML IV SOLN
40.0000 mg | INTRAVENOUS | Status: DC | PRN
  Administered 2012-04-29 – 2012-04-30 (×5): 40 mg via INTRAVENOUS
  Filled 2012-04-29 (×4): qty 4

## 2012-04-29 MED ORDER — PANTOPRAZOLE SODIUM 40 MG PO PACK
40.0000 mg | PACK | Freq: Every day | ORAL | Status: DC
  Administered 2012-04-29: 40 mg
  Filled 2012-04-29: qty 20

## 2012-04-29 NOTE — Progress Notes (Signed)
PULMONARY  / CRITICAL CARE MEDICINE  Name: Cody [name] MRN: [id number] DOB: 04/17/1950    LOS: 17  REFERRING MD:   EDP - Dr. Preston Fleeting  CHIEF COMPLAINT:  Shortness of breath  BRIEF PATIENT DESCRIPTION: 62 yo M with COPD and 3ppd smoking admitted 04/12/2012 with acute respiratory failure 2/2 influenza A and aspiration PNA, required intubated 04/13/11 for airway protection and aspirated during intubation. Extubated 1/15 but reintubated emergently 1/18 for severe delerium & resp distress, purulent secretions noted  LINES / TUBES: ETT 1/5 >>1/15 , 1/18 >> self extubated 1/21 >>> reintubated 1/21 L IJ 1/5 >> 1/13 R IJ 1/13 >> 1/16 RIJ 1/18 >>  R A-line 1/5 >>1/8 pulled out by pt  CULTURES: Blood culture 1/5>> Negative Resp culture 1/6>> H. influenzae  Resp viral panel 1/6>> Influenza A and Metapneumovirus  Sputum Leigonella Culture 1/6>> Negative Bronch Sputum 1/6>> H. influenzae  Urine 1/5>> Neg  Urine 1/6>> Neg  Flu 1/5>> Neg Blood culture 01/13 >> NG Catheter tip culture 1/13 >>Staph -coag neg-70 colonies(vanc sens)  C.diff PCR 1/14 >> Negative Resp culture 1/18 >>> Negative  ANTIBIOTICS: Zosyn 1/5>>1/9  Azithro 1/5>>1/8  Vanc 1/6>>>1/8 - then restarted 01/13 >>> Tamiflu 1/6>>>1/16  Rocephin 1/9>>1/15 Ceftazidine 1/18 >>   SIGNIFICANT EVENTS:  1/5 Intubated in ED  1/5 Shock , levophed  1/6 ECHO - 55%, mod dil rv, PA pressures>>> not measured b/c no TR jet  1/6 Bronched- diffuse pus all lobes  1/7 increasing pCO2, increased RR on vent  1/8 Changed sedation to Precedex, more agitated than with versed  1/9 Changed sedation back to Versed, agitation greatly improved  1/10 scheduled haldol  1/12 Restarting precedex gtt due to persistent agitation 1/13 Febrile to 102.6 overnight. ? Catheter infection? DC'ed L IJ, placed new R IJ. 1/14 No fevers overnight, agitation controlled on propofol (added 1/13), more interactive. 1/15 Had foul smelling stools overnight, therefore,  C.diff PCR was sent by RN. 1/15 EXTUBATED, developed A.fib with RVR overnight. 1/16 Persistent episodes of desaturation., afib>started on cardizem drip  1/17 increased agitation  1/18 reintubated  1/20 Persistent A. Fib this morning on dilt drip.  Rates controlled between 99-125 1/21 Self extubated, given a chance and continued to be in respiratory distress so reintubated.  1/22 Trach, planned, aggitated  LEVEL OF CARE:  ICU PRIMARY SERVICE:  PCCM CONSULTANTS:  None CODE STATUS: Full DIET:  TF DVT Px:  Heparin GI Px:  protonix  SUBJECTIVE / INTERVAL HISTORY:  Ok overnight. Reintubated. Agitated   VITAL SIGNS: Temp:  [98.4 F (36.9 C)-99.7 F (37.6 C)] 98.6 F (37 C) (01/22 0800) Pulse Rate:  [64-105] 65  (01/22 0900) Resp:  [17-21] 18  (01/22 0900) BP: (78-118)/(51-80) 98/65 mmHg (01/22 0900) SpO2:  [90 %-98 %] 94 % (01/22 0953) FiO2 (%):  [60 %-100 %] 60 % (01/22 0953) Weight:  [214 lb 1.1 oz (97.1 kg)] 214 lb 1.1 oz (97.1 kg) (01/22 0500) HEMODYNAMICS: VENTILATOR SETTINGS: Vent Mode:  [-] PRVC FiO2 (%):  [60 %-100 %] 60 % Set Rate:  [18 bmp] 18 bmp Vt Set:  [500 mL] 500 mL PEEP:  [10 cmH20] 10 cmH20 Plateau Pressure:  [16 cmH20-22 cmH20] 19 cmH20 INTAKE / OUTPUT: Intake/Output      01/21 0701 - 01/22 0700 01/22 0701 - 01/23 0700   I.V. (mL/kg) 1987.7 (20.5) 125.6 (1.3)   Other     NG/GT 860 60   IV Piggyback 500    Total Intake(mL/kg) 3347.7 (34.5) 185.6 (1.9)  Urine (mL/kg/hr) 2400 (1) 125   Total Output 2400 125   Net +947.7 +60.6         PHYSICAL EXAMINATION: General: Sedated and intubated   Head: Normocephalic, atraumatic.  Lungs:  Diminished BS, course rhonchi.    Heart: Irregularly irregular rhythm, tachycardic.  Abdomen:  BS normoactive. Soft, Nondistended, non-tender.  No masses or organomegaly.  Extremities: No pretibial edema.    IMAGING: 1/19 CXR >Similar bibasilar volume loss with probable small left pleural  effusion.    DIAGNOSES: Principal Problem:  *Acute respiratory failure with hypercapnia Active Problems:  HYPERLIPIDEMIA  TOBACCO USE  HYPERTENSION  GERD  COPD (chronic obstructive pulmonary disease)  Acute encephalopathy  Acute kidney injury  Atrial fibrillation  ASSESSMENT / PLAN:  PULMONARY  Lab 04/28/12 1702 04/27/12 0355 04/26/12 0854  PHART 7.352 7.382 7.387  PCO2ART 66.9* 57.0* 54.8*  PO2ART 78.0* 64.7* 56.0*  HCO3 37.0* 33.2* 32.9*  TCO2 39 35.0 35   A: 1) Acute hypercapnic respiratory failure/ARDS - secondary to flu, COPD exacerbation. Requiring increased FiO2 /PEEP 1/13 likely contributed by agitation. Extubated 1/15.>reintubate 1/18 self extubated 1/21 > reintubated 1/21 2) Aspiration - at time of intubation, finished course of treatment , restarted Vanc 1/13 fever  3) Pneumonitis vs. CAP - b/l interstitial opacities on cxr, slightly increased at left, likely atelectasis. Completed 8/8 days rocephin , on tapering steroids  4) Pulmonary edema - will likely need negative balance today.  Up almost 1.8L since the day before.  4) Non-oxygen dependent COPD - h/o chronic tobacco abuse hx 3 pack per day 20+ years. On symbicort at home. Not on home O2 therapy. 5) Bloody secretions from aspiration - Pulm /renal syndrome ruled out.   P:  - peep to 10, to goal 50%, goal to 50% and peep to 8 likely to obtain pre trach - Duonebs q6h + q2h PRN. - Decrease  Prednisone 30  mg daily taper as able (contributing to agitation)  - Smoking cessation education will be needed. - Lasix 40 mg IV to keep neg bal as scr allows  - Trach likely soon.   CARDIOVASCULAR  Lab 04/25/12 0903 04/25/12 0445 04/24/12 2148  CKTOTAL -- -- --  CKMB -- -- --  TROPONINI <0.30 <0.30 <0.30    Lab 04/27/12 0344  PROBNP 241.4*   A:  1) New onset atrial fibrillation with RVR - on cardizem PO and Hep. Converted to NSR.  2) Elevated proBNP - ProBNP on admission 17911 --> 398 on 1/12. Trop x3 negative. ECHO with  normal systolic and diastolic dysfunction, EF 50-55%.  3) HTN -   home regimen Lisinopril 40mg , Norvasc 5mg , and HCTZ 25mg  qd. -on hold  4) Hypotension - Resolved. Initially contributed by septic shock likely, previously requiring pressors. Resolved.   PLAN:  - PO Cardizem  - Goal HR < 120. - on IV heparin until extubated, especially as likely to require trach, dc at 5 am for procedure. - Check pCXR STAT.  RENAL  Lab 04/28/12 0455 04/27/12 0348 04/26/12 0413  NA 139 137 141  K 3.6 3.6 3.3*  CL 102 100 101  CO2 31 32 34*  BUN 30* 39* 41*  CREATININE 0.64 0.74 0.77  GLUCOSE 176* 134* 109*   A:  1) Acute Kidney injury - Resolved. Previously likely pre-renal in setting of volume depletion. Previously urinary sediment clogging foley, improved.  2) Hypernatremia - resolved  3) Hypokalemia   P:  - continue kvo IVFs, goal to keep even.  Up  over 1.8L in the last 48 hours.  - Check AM BMET. - Continue to monitor renal function. -lasix, kvo  GASTROINTESTINAL  Lab 04/24/12 2147 04/23/12 0400  AST 26 25  ALT 25 24  ALKPHOS 72 68  BILITOT 1.5* 0.9  PROT 6.9 6.6  ALBUMIN 3.0* 2.8*   A:  1) GERD 2) Diarrhea - resolving. C. Diff negative.  P: - Continue TFs on Vital. Held for trach - PPI on vent - Likely to require PEG, hold off for 1 week to see if can slp   HEMATOLOGIC  Lab 04/29/12 0446 04/28/12 0455 04/28/12 0157 04/27/12 0348 04/25/12 0445 04/24/12 0530  WBC 11.3* 13.2* -- 13.3* -- --  HGB 14.8 16.3 -- 17.1* -- --  HCT 46.9 48.4 -- 52.7* -- --  PLT 139* 143* -- 122* -- --  APTT -- -- -- -- -- --  INR -- -- 1.04 -- 1.54* 0.97    A:  1) Polycythemia- Hgb stable.  2) Leukocytosis -  DC of left IJ on 1/13 (cath tip infection-staph/coag neg )  He remains on steroids.   P:  - hep drip , tolerated, hold 5 am  - no coumadin  - INR wnl pre trach  INFECTIOUS  Lab 04/29/12 0446 04/28/12 0455 04/27/12 1230  LATICACIDVEN -- -- --  PROCALCITON 0.17 0.15 0.14   1)  Flu pneumonia - Flu A and metapneumovirus >finished full course of Tamiflu , droplet isolation d/c 1/18 2) Aspirated at time of intubation, pneumonitis vs PNA>finished flu course of Rocephin  3) Nosocomial infection -  Left IJ (in 8 days) was dc'ed on 1/13 and catheter tip cultures>staph/coag neg . Was resumed Vanc on 1/13 4) Diarrhea - C.diff neg. 5) Leukocytosis - remains afebrile, Ceftaz added 1/18 w/ sputum cx negative.  Treading downward.  Procalcitonin 0.14 --> 0.15. May need to consider coverage for aspiration if worsening clinically.  P:  - Consider coverage for aspiration (Zosyn) if clinically worsening (dc ceftaz), or CXR suggestive (pending).  ENDOCRINE  Lab 04/29/12 0845 04/29/12 0345 04/28/12 2326 04/28/12 1935 04/28/12 1722  GLUCAP 114* 105* 125* 155* 178*   A: 1) On steroids - CBGs at goal. Lantus off P:  - Cont ICU Hyperglycemic protocol. - CBG monitoring qAC and HS. - Steroids reduction  NEUROLOGIC / PSYCHIATRIC   A:  1) Acute encephalopathy - Resolved. Likely secondary to hypoxia / hypercarbia and possible infectious process (CAP?). >returned 1/18 >requiring intubation  2) Delerium - returned 1/18 , improved w/ sedation propofol /fent, although again agitation significant when off of propofol.  P:  - use propofol + fent as sedation - Delirium, avoid benzo - did well with Seroquel, change Risperdal to serequal - Monitor QTc.  Family updated  Signed: Johnette Abraham, Roma Schanz, Internal Medicine Resident Pager: [phone] (7AM-5PM) 04/29/2012, 10:13 AM    Ccm time 30 min   I have fully examined this patient and agree with above findings.    And edited infull  [name]. [name], MD, FACP Pgr: [phone] [facility]

## 2012-04-29 NOTE — Procedures (Signed)
Bedside Tracheostomy Insertion Procedure Note   Patient Details:   Name: Cody [name] DOB: [birth year] MRN: [id number]  Procedure: Tracheostomy  Pre Procedure Assessment: ET Tube Size:8.0 ET Tube secured at lip (cm):24 Bite block in place: Yes Breath Sounds: Clear and Diminished  Post Procedure Assessment: BP 94/66  Pulse 103  Temp 98.6 F (37 C) (Oral)  Resp 20  Ht 5\' 10"  (1.778 m)  Wt 214 lb 1.1 oz (97.1 kg)  BMI 30.72 kg/m2  SpO2 94% O2 sats: stable throughout Complications: Complications of bleeding, see MD note Patient did tolerate procedure well Tracheostomy Brand:Shiley Tracheostomy Style:Cuffed Tracheostomy Size: 8.0 Tracheostomy Secured NWG:[id number] Tracheostomy Placement Confirmation:Trach cuff visualized and in place    Leonard Downing 04/29/2012, 12:39 PM

## 2012-04-29 NOTE — Progress Notes (Signed)
Will follow from afar  Call if questions

## 2012-04-29 NOTE — Progress Notes (Signed)
CONSULT NOTE - Follow Up  Pharmacy Consult for heparin Indication: atrial fibrillation   No Known Allergies Patient Measurements: Height: 5\' 10"  (177.8 cm) Weight: 214 lb 1.1 oz (97.1 kg) IBW/kg (Calculated) : 73  Heparin Dosing Weight: 93kg  Vital Signs: Temp: 98.4 F (36.9 C) (01/22 0400) Temp src: Oral (01/22 0400) BP: 90/60 mmHg (01/22 0700) Pulse Rate: 64  (01/22 0700) Labs:  Basename 04/29/12 0446 04/28/12 1930 04/28/12 0455 04/28/12 0157 04/27/12 0348  HGB 14.8 -- 16.3 -- --  HCT 46.9 -- 48.4 -- 52.7*  PLT 139* -- 143* -- 122*  APTT -- -- -- -- --  LABPROT -- -- -- 13.5 --  INR -- -- -- 1.04 --  HEPARINUNFRC 0.70 0.53 -- 0.26* --  CREATININE -- -- 0.64 -- 0.74  CKTOTAL -- -- -- -- --  CKMB -- -- -- -- --  TROPONINI -- -- -- -- --   Estimated Creatinine Clearance: 110.4 ml/min (by C-G formula based on Cr of 0.64).  Assessment: 62yo male on IV heparin for afib.  Heparin level is 0.7 which is at upper end of goal on 2700 units/hr. Hemoglobin is down 1.5g/dl overnight but platelets have remained stable. No bleeding has been noted.  Heparin is currently off for trach this morning.  Goal of Therapy:  Heparin level 0.3-0.7 units/ml Monitor platelets by anticoagulation protocol: Yes Vancomycin trough 15-20 mcg/ml for HCAP   Plan:  1. Decrease heparin to 2600 units/hr once ok to resume post trach 2. Daily CBC/HL  Sheppard Coil, Pharm.D. [phone] 04/29/2012 8:27 AM

## 2012-04-29 NOTE — Procedures (Signed)
Bronchoscopy  for Percutaneous  Tracheostomy  Name: Cody [name] MRN: [id number] DOB: Sep 13, 1950 Procedure: Bronchoscopy for Percutaneous Tracheostomy Indications: Diagnostic evaluation of the airways and Remove secretions In conjunction with: Dr. [name]   Procedure Details Consent: Risks of procedure as well as the alternatives and risks of each were explained to the (patient/caregiver).  Consent for procedure obtained. Time Out: Verified patient identification, verified procedure, site/side was marked, verified correct patient position, special equipment/implants available, medications/allergies/relevent history reviewed, required imaging and test results available.  Performed  In preparation for procedure, patient was given 100% FiO2 and bronchoscope lubricated. Sedation: Benzodiazepines, Muscle relaxants and Short-acting barbiturates  Airway entered and the following bronchi were examined: Bronchi.   Procedures performed: Endotracheal Tube retracted in 2 cm increments. Cannulation of airway observed. Dilation observed. Placement of trachel tube  observed . No overt complications. Bronchoscope removed.  , Patient placed back on 100% FiO2 at conclusion of procedure.    Evaluation Hemodynamic Status: Transient hypotension treated with fluid; O2 sats: stable throughout Patient's Current Condition: stable Specimens:  None Complications: Complications of superficial bleeding , controlled with bovie and ligation. Patient did tolerate procedure well.   Brett Canales Minor ACNP [facility] Pager [phone] till 3 pm If no answer page [phone] 04/29/2012, 12:14 PM  Patient seen and examined, agree with above note.  I dictated the care and orders written for this patient under my direction.  Alyson Reedy, MD [phone]

## 2012-04-29 NOTE — Procedures (Signed)
I was asked by Dr. Rory Percy to assist him with a bedside tracheostomy were some anterior cervical hemorrhage had resulted from the initial dissection.  Upon gowning sterilely in an efficient manner I went to the patient's bedside where he had a longitudinal incision in the midportion of his neck. There was extensive venous bleeding from the midportion of the wound. I was able to partially control this by using a hemostat clamp somewhat superficial in the subcutaneous tissue. Subsequently a very large anterior jugular crossing vein was noted. This is isolated proximally and distally after giving the operating room staff there and proper lighting.  The vein was isolated and clamped proximally and distally and subsequently ligated with 3-0 silk suture.  We were subsequently able to dissect down to the pretracheal fascia where Dr. [name] we went ahead and performed a percutaneous tracheostomy I stayed at the bedside with the primary physician and knows no further bleeding.

## 2012-04-29 NOTE — Procedures (Signed)
Bronchoscopy Procedure Note CHRISTIPHER RIEGER [id number] 1950/04/15  Procedure: Bronchoscopy Indications: Diagnostic evaluation of the airways  Procedure Details Consent: Unable to obtain consent because of emergent medical necessity. Time Out: Verified patient identification, verified procedure, site/side was marked, verified correct patient position, special equipment/implants available, medications/allergies/relevent history reviewed, required imaging and test results available.  Performed  In preparation for procedure, patient was given 100% FiO2 and bronchoscope lubricated. Sedation: Muscle relaxants  Airway entered and the following bronchi were examined: Bronchi.   Placed through ett, after trach removed, advance over direct guidance to 2 cm above carina. After re exploration and hemostasis, then back up ett to new trach sute and observe wire, dilator and new trach placed.  Procedures performed: Brushings performed, none Bronchoscope removed.    Evaluation Hemodynamic Status: BP stable throughout; O2 sats: stable throughout Patient's Current Condition: stable Specimens:  None Complications: No apparent complications Patient did tolerate procedure well.   Cody [name] 04/29/2012

## 2012-04-29 NOTE — Procedures (Signed)
Attempted perc trach  Consent family, son, aware risks, infection , bleeding, PTX, death Bronch placed, ett backed up to 19 cm   Lido 8 cc plus epi injected over 2-3 ITS 1.2 cm vertical incision made I placed kelly to dissect about 0.5 cm and significant gosh dark non pulsatile blood noted Blood loss in total about 50 cc Compressed, obtain hemostasis, called Dr Lindie Spruce from Trauma surgery to assist. Family updated  [name]. [name], MD, FACP Pgr: [phone] [facility]

## 2012-04-29 NOTE — Procedures (Signed)
After the initial procedure which had been performed to control hemorrhage from a percutaneous tracheostomy the patient we bled after a significant amount of coughing while on the ventilator with a tracheostomy tube in place.  The bleeding was controlled with direct pressure by the nursing staff prior to my getting into a sterile gown and glove to help assist with control of bleeding. Subsequently when they removed the pressure does no active bleeding. Dr. Rory Percy intubated the patient as we controlled the tracheostomy tube. However he did not pass the tip of the endotracheal tube beyond the vocal cords very much because he would run into the inserter tracheostomy tube. He had to wait for [place] to remove that and replace it with a guide in order for [place] to pass a tracheostomy in place again.  We again and elicited the assistance from the operating room staff. After all proper equipment and it was in place a bougie guide was passed through the tracheostomy. It was left in place of the tube was removed. Dr. [name] subsequently passed the endotracheal tube just distal to the tracheotomy. With the endotracheal tube in place in the tracheostomy tube removed we were able to find that the bleeding was coming from a different source. There was a vein in the inferior portion of the neck which it started to bleed and possibly was partially torn by the prior procedure. We were able to ligate this with a 2-0 Vicryl suture. Once this was done we vigorously irrigated the wound inspected carefully look for further source of bleeding. Number found.  We used a new Blue Rhino #8 tracheostomy set up in order to replace the patient some 8 Shiley tracheostomy tube. This was done without event using a guide and the wire from the set. The tracheostomy tube was passed over a 28 French dilator into the tracheotomy. It was secured in place with 4 corner stitches. There was no further bleeding. All counts were correct.  Prior  to full insertion of the tracheostomy tube the endotracheal tube that had been passed by Dr. [name] for bronchoscopic assistance could see the tracheostomy tube in place after been retracted back just below the cords.

## 2012-04-29 NOTE — Procedures (Signed)
Intubation Procedure Note Cody [name] [id number] July 17, 1950  Procedure: Intubation Indications: Respiratory insufficiency, to dc trach for rexploration  Procedure Details Consent: Unable to obtain consent because of emergent medical necessity. Time Out: Verified patient identification, verified procedure, site/side was marked, verified correct patient position, special equipment/implants available, medications/allergies/relevent history reviewed, required imaging and test results available.  Performed  Maximum sterile technique was used including antiseptics, cap, gloves, gown, hand hygiene, mask and sheet.  MAC and 4   Placed scope noted edema airway severe,  Placed ett just through cords, did not push with resistance Evaluation Hemodynamic Status: BP stable throughout; O2 sats: stable throughout Patient's Current Condition: stable Complications: No apparent complications Patient did tolerate procedure well. Chest X-ray ordered to verify placement.  CXR: pending.   Cody [name] 04/29/2012

## 2012-04-30 ENCOUNTER — Encounter (HOSPITAL_COMMUNITY): Payer: Self-pay | Admitting: Radiology

## 2012-04-30 ENCOUNTER — Inpatient Hospital Stay (HOSPITAL_COMMUNITY): Payer: Medicaid Other

## 2012-04-30 LAB — PREPARE FRESH FROZEN PLASMA: Unit division: 0

## 2012-04-30 LAB — CBC
Hemoglobin: 14.9 g/dL (ref 13.0–17.0)
MCV: 93.6 fL (ref 78.0–100.0)
Platelets: 143 10*3/uL — ABNORMAL LOW (ref 150–400)
RBC: 4.98 MIL/uL (ref 4.22–5.81)
WBC: 12.5 10*3/uL — ABNORMAL HIGH (ref 4.0–10.5)

## 2012-04-30 LAB — GLUCOSE, CAPILLARY
Glucose-Capillary: 217 mg/dL — ABNORMAL HIGH (ref 70–99)
Glucose-Capillary: 206 mg/dL — ABNORMAL HIGH (ref 70–99)
Glucose-Capillary: 181 mg/dL — ABNORMAL HIGH (ref 70–99)
Glucose-Capillary: 172 mg/dL — ABNORMAL HIGH (ref 70–99)

## 2012-04-30 LAB — POCT I-STAT 3, ART BLOOD GAS (G3+)
Acid-Base Excess: 7 mmol/L — ABNORMAL HIGH (ref 0.0–2.0)
O2 Saturation: 95 %
pCO2 arterial: 68.1 mmHg (ref 35.0–45.0)

## 2012-04-30 LAB — COMPREHENSIVE METABOLIC PANEL
ALT: 23 U/L (ref 0–53)
AST: 17 U/L (ref 0–37)
CO2: 35 mEq/L — ABNORMAL HIGH (ref 19–32)
Calcium: 8.6 mg/dL (ref 8.4–10.5)
Potassium: 4.5 mEq/L (ref 3.5–5.1)
Sodium: 136 mEq/L (ref 135–145)
Total Protein: 5.6 g/dL — ABNORMAL LOW (ref 6.0–8.3)

## 2012-04-30 LAB — HEPARIN LEVEL (UNFRACTIONATED): Heparin Unfractionated: <0.1 IU/mL — ABNORMAL LOW (ref 0.30–0.70)

## 2012-04-30 MED ORDER — PRO-STAT SUGAR FREE PO LIQD
6.0000 mL | Freq: Three times a day (TID) | ORAL | Status: DC
  Administered 2012-04-30: 6 mL via ORAL
  Filled 2012-04-30 (×5): qty 30

## 2012-04-30 MED ORDER — INSULIN ASPART 100 UNIT/ML ~~LOC~~ SOLN
0.0000 [IU] | SUBCUTANEOUS | Status: DC
  Administered 2012-04-30 (×3): 3 [IU] via SUBCUTANEOUS
  Administered 2012-04-30: 5 [IU] via SUBCUTANEOUS
  Administered 2012-04-30: 3 [IU] via SUBCUTANEOUS
  Administered 2012-05-01: 2 [IU] via SUBCUTANEOUS
  Administered 2012-05-01: 3 [IU] via SUBCUTANEOUS
  Administered 2012-05-01: 2 [IU] via SUBCUTANEOUS
  Administered 2012-05-01 – 2012-05-02 (×4): 3 [IU] via SUBCUTANEOUS
  Administered 2012-05-02: 2 [IU] via SUBCUTANEOUS
  Administered 2012-05-02 – 2012-05-03 (×5): 3 [IU] via SUBCUTANEOUS
  Administered 2012-05-03 (×2): 2 [IU] via SUBCUTANEOUS
  Administered 2012-05-03: 8 [IU] via SUBCUTANEOUS
  Administered 2012-05-03: 3 [IU] via SUBCUTANEOUS
  Administered 2012-05-04 (×4): 2 [IU] via SUBCUTANEOUS
  Administered 2012-05-04 – 2012-05-05 (×2): 3 [IU] via SUBCUTANEOUS
  Administered 2012-05-05: 2 [IU] via SUBCUTANEOUS
  Administered 2012-05-05: 5 [IU] via SUBCUTANEOUS
  Administered 2012-05-06 (×2): 2 [IU] via SUBCUTANEOUS
  Administered 2012-05-07: 3 [IU] via SUBCUTANEOUS
  Administered 2012-05-07 – 2012-05-08 (×4): 2 [IU] via SUBCUTANEOUS
  Administered 2012-05-08 – 2012-05-09 (×2): 3 [IU] via SUBCUTANEOUS
  Administered 2012-05-09 (×3): 2 [IU] via SUBCUTANEOUS
  Administered 2012-05-10 (×2): 3 [IU] via SUBCUTANEOUS
  Administered 2012-05-11: 2 [IU] via SUBCUTANEOUS
  Administered 2012-05-11: 3 [IU] via SUBCUTANEOUS
  Administered 2012-05-12: 2 [IU] via SUBCUTANEOUS
  Administered 2012-05-12 (×2): 3 [IU] via SUBCUTANEOUS
  Administered 2012-05-13 (×6): 2 [IU] via SUBCUTANEOUS
  Administered 2012-05-14: 3 [IU] via SUBCUTANEOUS
  Administered 2012-05-14 (×2): 2 [IU] via SUBCUTANEOUS
  Administered 2012-05-15 (×2): 3 [IU] via SUBCUTANEOUS
  Administered 2012-05-15: 2 [IU] via SUBCUTANEOUS
  Administered 2012-05-15: 3 [IU] via SUBCUTANEOUS
  Administered 2012-05-15 (×2): 2 [IU] via SUBCUTANEOUS
  Administered 2012-05-16: 3 [IU] via SUBCUTANEOUS
  Administered 2012-05-16: 5 [IU] via SUBCUTANEOUS
  Administered 2012-05-16 (×2): 3 [IU] via SUBCUTANEOUS
  Administered 2012-05-17 (×4): 2 [IU] via SUBCUTANEOUS
  Administered 2012-05-17: 3 [IU] via SUBCUTANEOUS
  Administered 2012-05-17: 2 [IU] via SUBCUTANEOUS
  Administered 2012-05-18: 5 [IU] via SUBCUTANEOUS
  Administered 2012-05-18 (×3): 2 [IU] via SUBCUTANEOUS
  Administered 2012-05-18: 3 [IU] via SUBCUTANEOUS
  Administered 2012-05-19: 2 [IU] via SUBCUTANEOUS
  Administered 2012-05-19: 3 [IU] via SUBCUTANEOUS
  Administered 2012-05-19 – 2012-05-21 (×4): 2 [IU] via SUBCUTANEOUS
  Administered 2012-05-21: 3 [IU] via SUBCUTANEOUS
  Administered 2012-05-22 (×2): 2 [IU] via SUBCUTANEOUS
  Administered 2012-05-22: 3 [IU] via SUBCUTANEOUS
  Administered 2012-05-23 (×2): 2 [IU] via SUBCUTANEOUS

## 2012-04-30 MED ORDER — FUROSEMIDE 10 MG/ML IJ SOLN
40.0000 mg | Freq: Every day | INTRAMUSCULAR | Status: DC
  Administered 2012-04-30 – 2012-05-03 (×4): 40 mg via INTRAVENOUS
  Filled 2012-04-30 (×5): qty 4

## 2012-04-30 MED ORDER — VITAL 1.5 CAL PO LIQD
1000.0000 mL | ORAL | Status: DC
  Administered 2012-04-30 – 2012-05-01 (×2): 1000 mL
  Filled 2012-04-30 (×6): qty 1000

## 2012-04-30 MED ORDER — DIPHENHYDRAMINE HCL 50 MG/ML IJ SOLN
25.0000 mg | Freq: Once | INTRAMUSCULAR | Status: AC
  Administered 2012-04-30: 25 mg via INTRAVENOUS
  Filled 2012-04-30: qty 1

## 2012-04-30 NOTE — Progress Notes (Signed)
NUTRITION FOLLOW UP  Intervention:   1. Initiate Vital 1.5 @ 20 ml/hr via OG and hold, this is the goal rate. 60 ml Prostat TID.  At goal rate, tube feeding regimen + kcal from Propofol will provide 1922 kcal, 122 grams of protein, and 367 ml of H2O.  *unable to meet protein needs at this time with kcal from Propofol  2. RD will continue to follow   Nutrition Dx:   Inadequate oral intake related critical illness as evidenced by NPO status, ongoing  Goal:   Intake to meet >/=90% estimated nutrition needs. Unmet at this time.   Monitor:   Vent status, PEG placement, TF tolerance, weight trends, labs, I/O's  Assessment:   Pt self extubated and did not tolerate, required trach. MD placed feeding tube this morning.    Per rounds, not expected to wean off vent support quickly, will need a PEG in the near future. RD will add TF orders.   Height: Ht Readings from Last 1 Encounters:  09/19/10 5' 10.5" (1.791 m)    Weight Status:   Wt Readings from Last 1 Encounters:  04/30/12 217 lb 6 oz (98.6 kg)  trending up from 1/19, lowest weight was 206 lbs.   Patient is currently intubated on ventilator support.  MV: 9.2  Temp:Temp (24hrs), Avg:98.7 F (37.1 C), Min:98.5 F (36.9 C), Max:98.9 F (37.2 C)  Propofol: 22.8 ml/hr providing 602 kcal from lipids at this time. Per rounds, likely will be trending down.   Re-estimated needs:  Kcal: 1969 Protein: >/=140 gm  Fluid: >/=1.9 L/day  Skin: moisture associated wound to upper thigh   Diet Order: NPO   Intake/Output Summary (Last 24 hours) at 04/30/12 1106 Last data filed at 04/30/12 1000  Gross per 24 hour  Intake 3142.33 ml  Output   2035 ml  Net 1107.33 ml    Last BM: 1/22   Labs:   Lab 04/30/12 0500 04/29/12 0446 04/28/12 0455 04/27/12 0348 04/25/12 0445 04/24/12 2147  NA 136 -- 139 137 -- --  K 4.5 -- 3.6 3.6 -- --  CL 96 -- 102 100 -- --  CO2 35* -- 31 32 -- --  BUN 28* -- 30* 39* -- --  CREATININE 0.60 -- 0.64  0.74 -- --  CALCIUM 8.6 -- 8.1* 8.6 -- --  MG 2.1 -- -- -- 1.9 1.9  PHOS 3.1 3.3 2.7 -- -- --  GLUCOSE 209* -- 176* 134* -- --    CBG (last 3)   Basename 04/30/12 0359 04/30/12 0017 04/29/12 2004  GLUCAP 217* 206* 190*    Scheduled Meds:   . antiseptic oral rinse  15 mL Mouth Rinse QID  . chlorhexidine  15 mL Mouth Rinse BID  . etomidate  40 mg Intravenous Once  . famotidine (PEPCID) IV  20 mg Intravenous Q12H  . fentaNYL  200 mcg Intravenous Once  . furosemide  40 mg Intravenous Daily  . insulin aspart  0-15 Units Subcutaneous Q4H  . ipratropium  0.5 mg Nebulization Q6H  . levalbuterol  0.63 mg Nebulization Q6H  . methylPREDNISolone (SOLU-MEDROL) injection  80 mg Intravenous Q8H  . multivitamin  5 mL Per Tube Daily  . piperacillin-tazobactam (ZOSYN)  IV  3.375 g Intravenous Q8H  . sodium chloride  500 mL Intravenous Once  . vancomycin  1,000 mg Intravenous Q8H    Continuous Infusions:   . sodium chloride 10 mL/hr at 04/30/12 1000  . fentaNYL infusion INTRAVENOUS 200 mcg/hr (04/30/12 1000)  .  norepinephrine (LEVOPHED) Adult infusion    . propofol 39.916 mcg/kg/min (04/30/12 1000)    Clarene Duke RD, LDN Pager [phone] After Hours pager [phone]

## 2012-04-30 NOTE — H&P (Signed)
Cody [name] is an 62 y.o. male.   Chief Complaint: COPD exacerbation ARDS; on vent 3ppd smoker Asp pneumonia; encephalopathic; delerium Protein calorie malnutrition For long term care placement or SNF/trach Scheduled for percutaneous gastric tube placement in IR 1/24 HPI: CVA; dysphagia; HTN; HLD; GERD; COPD  Past Medical History  Diagnosis Date  . Stroke   . Hypertension   . Hyperlipidemia   . GERD (gastroesophageal reflux disease)   . COPD (chronic obstructive pulmonary disease)     History reviewed. No pertinent past surgical history.  Family History  Problem Relation Age of Onset  . COPD Mother    Social History:  reports that he has been smoking Cigarettes.  He has been smoking about .5 packs per day. He has never used smokeless tobacco. He reports that he does not drink alcohol or use illicit drugs.  Allergies:  Allergies  Allergen Reactions  . Protamine Anaphylaxis    Medications Prior to Admission  Medication Sig Dispense Refill  . amLODipine (NORVASC) 5 MG tablet Take 1 tablet (5 mg total) by mouth daily.  90 tablet  3  . aspirin 81 MG tablet Take 81 mg by mouth daily.        . budesonide-formoterol (SYMBICORT) 160-4.5 MCG/ACT inhaler Inhale 2 puffs into the lungs 2 (two) times daily.  1 Inhaler  3  . hydrochlorothiazide (HYDRODIURIL) 25 MG tablet Take 1 tablet (25 mg total) by mouth daily.  90 tablet  3  . Ibuprofen-Diphenhydramine HCl (ADVIL PM) 200-25 MG CAPS Take 1 capsule by mouth at bedtime as needed. For sleep      . lisinopril (PRINIVIL,ZESTRIL) 40 MG tablet Take 1 tablet (40 mg total) by mouth daily.  90 tablet  3    Results for orders placed during the hospital encounter of 04/12/12 (from the past 48 hour(s))  POCT I-STAT 3, BLOOD GAS (G3+)     Status: Abnormal   Collection Time   04/28/12  5:02 PM      Component Value Range Comment   pH, Arterial 7.352  7.350 - 7.450    pCO2 arterial 66.9 (*) 35.0 - 45.0 mmHg    pO2, Arterial 78.0 (*) 80.0 -  100.0 mmHg    Bicarbonate 37.0 (*) 20.0 - 24.0 mEq/L    TCO2 39  0 - 100 mmol/L    O2 Saturation 94.0      Acid-Base Excess 8.0 (*) 0.0 - 2.0 mmol/L    Patient temperature 99.2 F      Collection site RADIAL, Kennen'S TEST ACCEPTABLE      Drawn by RT      Sample type ARTERIAL      Comment NOTIFIED PHYSICIAN     GLUCOSE, CAPILLARY     Status: Abnormal   Collection Time   04/28/12  5:22 PM      Component Value Range Comment   Glucose-Capillary 178 (*) 70 - 99 mg/dL   HEPARIN LEVEL (UNFRACTIONATED)     Status: Normal   Collection Time   04/28/12  7:30 PM      Component Value Range Comment   Heparin Unfractionated 0.53  0.30 - 0.70 IU/mL   VANCOMYCIN, TROUGH     Status: Normal   Collection Time   04/28/12  7:30 PM      Component Value Range Comment   Vancomycin Tr 16.6  10.0 - 20.0 ug/mL   GLUCOSE, CAPILLARY     Status: Abnormal   Collection Time   04/28/12  7:35 PM  Component Value Range Comment   Glucose-Capillary 155 (*) 70 - 99 mg/dL   GLUCOSE, CAPILLARY     Status: Abnormal   Collection Time   04/28/12 11:26 PM      Component Value Range Comment   Glucose-Capillary 125 (*) 70 - 99 mg/dL   GLUCOSE, CAPILLARY     Status: Abnormal   Collection Time   04/29/12  3:45 AM      Component Value Range Comment   Glucose-Capillary 105 (*) 70 - 99 mg/dL   PHOSPHORUS     Status: Normal   Collection Time   04/29/12  4:46 AM      Component Value Range Comment   Phosphorus 3.3  2.3 - 4.6 mg/dL   CBC     Status: Abnormal   Collection Time   04/29/12  4:46 AM      Component Value Range Comment   WBC 11.3 (*) 4.0 - 10.5 K/uL    RBC 4.91  4.22 - 5.81 MIL/uL    Hemoglobin 14.8  13.0 - 17.0 g/dL    HCT 16.1  09.6 - 04.5 %    MCV 95.5  78.0 - 100.0 fL    MCH 30.1  26.0 - 34.0 pg    MCHC 31.6  30.0 - 36.0 g/dL    RDW 40.9 (*) 81.1 - 15.5 %    Platelets 139 (*) 150 - 400 K/uL   HEPARIN LEVEL (UNFRACTIONATED)     Status: Normal   Collection Time   04/29/12  4:46 AM      Component Value  Range Comment   Heparin Unfractionated 0.70  0.30 - 0.70 IU/mL   PROCALCITONIN     Status: Normal   Collection Time   04/29/12  4:46 AM      Component Value Range Comment   Procalcitonin 0.17     ABO/RH     Status: Normal   Collection Time   04/29/12  4:50 AM      Component Value Range Comment   ABO/RH(D) O POS     GLUCOSE, CAPILLARY     Status: Abnormal   Collection Time   04/29/12  8:45 AM      Component Value Range Comment   Glucose-Capillary 114 (*) 70 - 99 mg/dL   PREPARE FRESH FROZEN PLASMA     Status: Normal   Collection Time   04/29/12 12:00 PM      Component Value Range Comment   Unit Number [id number]      Blood Component Type THAWED PLASMA      Unit division 00      Status of Unit ISSUED,FINAL      Transfusion Status OK TO TRANSFUSE      Unit Number [id number]      Blood Component Type THAWED PLASMA      Unit division 00      Status of Unit ISSUED,FINAL      Unit tag comment VERBAL ORDERS PER DR [name]      Transfusion Status OK TO TRANSFUSE     TYPE AND SCREEN     Status: Normal   Collection Time   04/29/12  1:20 PM      Component Value Range Comment   ABO/RH(D) O POS      Antibody Screen NEG      Sample Expiration 05/02/2012     CBC     Status: Abnormal   Collection Time   04/29/12  1:28 PM      Component  Value Range Comment   WBC 10.5  4.0 - 10.5 K/uL    RBC 5.49  4.22 - 5.81 MIL/uL    Hemoglobin 16.9  13.0 - 17.0 g/dL    HCT 56.2 (*) 13.0 - 52.0 %    MCV 96.2  78.0 - 100.0 fL    MCH 30.8  26.0 - 34.0 pg    MCHC 32.0  30.0 - 36.0 g/dL    RDW 86.5 (*) 78.4 - 15.5 %    Platelets 131 (*) 150 - 400 K/uL   PROTIME-INR     Status: Normal   Collection Time   04/29/12  1:28 PM      Component Value Range Comment   Prothrombin Time 12.7  11.6 - 15.2 seconds    INR 0.96  0.00 - 1.49   GLUCOSE, CAPILLARY     Status: Abnormal   Collection Time   04/29/12  5:12 PM      Component Value Range Comment   Glucose-Capillary 193 (*) 70 - 99 mg/dL   GLUCOSE,  CAPILLARY     Status: Abnormal   Collection Time   04/29/12  8:04 PM      Component Value Range Comment   Glucose-Capillary 190 (*) 70 - 99 mg/dL   GLUCOSE, CAPILLARY     Status: Abnormal   Collection Time   04/30/12 12:17 AM      Component Value Range Comment   Glucose-Capillary 206 (*) 70 - 99 mg/dL   GLUCOSE, CAPILLARY     Status: Abnormal   Collection Time   04/30/12  3:59 AM      Component Value Range Comment   Glucose-Capillary 217 (*) 70 - 99 mg/dL   PHOSPHORUS     Status: Normal   Collection Time   04/30/12  5:00 AM      Component Value Range Comment   Phosphorus 3.1  2.3 - 4.6 mg/dL   CBC     Status: Abnormal   Collection Time   04/30/12  5:00 AM      Component Value Range Comment   WBC 12.5 (*) 4.0 - 10.5 K/uL    RBC 4.98  4.22 - 5.81 MIL/uL    Hemoglobin 14.9  13.0 - 17.0 g/dL    HCT 69.6  29.5 - 28.4 %    MCV 93.6  78.0 - 100.0 fL    MCH 29.9  26.0 - 34.0 pg    MCHC 32.0  30.0 - 36.0 g/dL    RDW 13.2 (*) 44.0 - 15.5 %    Platelets 143 (*) 150 - 400 K/uL   HEPARIN LEVEL (UNFRACTIONATED)     Status: Abnormal   Collection Time   04/30/12  5:00 AM      Component Value Range Comment   Heparin Unfractionated <0.10 (*) 0.30 - 0.70 IU/mL   COMPREHENSIVE METABOLIC PANEL     Status: Abnormal   Collection Time   04/30/12  5:00 AM      Component Value Range Comment   Sodium 136  135 - 145 mEq/L    Potassium 4.5  3.5 - 5.1 mEq/L    Chloride 96  96 - 112 mEq/L    CO2 35 (*) 19 - 32 mEq/L    Glucose, Bld 209 (*) 70 - 99 mg/dL    BUN 28 (*) 6 - 23 mg/dL    Creatinine, Ser 1.02  0.50 - 1.35 mg/dL    Calcium 8.6  8.4 - 72.5 mg/dL    Total  Protein 5.6 (*) 6.0 - 8.3 g/dL    Albumin 2.0 (*) 3.5 - 5.2 g/dL    AST 17  0 - 37 U/L    ALT 23  0 - 53 U/L    Alkaline Phosphatase 61  39 - 117 U/L    Total Bilirubin 0.7  0.3 - 1.2 mg/dL    GFR calc non Af Amer >90  >90 mL/min    GFR calc Af Amer >90  >90 mL/min   MAGNESIUM     Status: Normal   Collection Time   04/30/12  5:00 AM       Component Value Range Comment   Magnesium 2.1  1.5 - 2.5 mg/dL   POCT I-STAT 3, BLOOD GAS (G3+)     Status: Abnormal   Collection Time   04/30/12  8:39 AM      Component Value Range Comment   pH, Arterial 7.337 (*) 7.350 - 7.450    pCO2 arterial 68.1 (*) 35.0 - 45.0 mmHg    pO2, Arterial 84.0  80.0 - 100.0 mmHg    Bicarbonate 36.4 (*) 20.0 - 24.0 mEq/L    TCO2 38  0 - 100 mmol/L    O2 Saturation 95.0      Acid-Base Excess 7.0 (*) 0.0 - 2.0 mmol/L    Patient temperature 98.9 F      Collection site RADIAL, Bo'S TEST ACCEPTABLE      Drawn by RT      Sample type ARTERIAL      Comment NOTIFIED PHYSICIAN     GLUCOSE, CAPILLARY     Status: Abnormal   Collection Time   04/30/12 12:07 PM      Component Value Range Comment   Glucose-Capillary 183 (*) 70 - 99 mg/dL    Dg Chest Port 1 View  04/29/2012  *RADIOLOGY REPORT*  Clinical Data: Question of expanding left apical hematoma  PORTABLE CHEST - 1 VIEW  Comparison: Chest x-ray of 04/29/2012  Findings: The triangular opacity within the left upper lobe near the apex is stable.  Bibasilar atelectasis and possible small effusions remain.  Cardiomegaly is stable.  Tracheostomy and right central venous line are unchanged.  IMPRESSION: 1.  Stable triangular opacity in the left lung apex. 2.  Stable bibasilar atelectasis.   Original Report Authenticated By: Dwyane Dee, M.D.    Chest Portable 1 View To Assess Tube Placement And Rule-out Pneumothorax  04/29/2012  *RADIOLOGY REPORT*  Clinical Data: Tube placement.  Rule out pneumothorax.  PORTABLE CHEST - 1 VIEW  Comparison: 04/28/2012.  Findings: Endotracheal tube removed and tracheostomy tube placed with the tip located midline.  No gross pneumothorax.  There is however, a new wedge-shaped density in the left lung apex.  This cannot be established as being outside the patient.  Small hematoma not excluded.  Attention to this on follow-up.  Right central line tip just proximal to the formation of the  superior vena cava.  Pulmonary edema.  Elevated hemidiaphragms.  Pleural effusions may be present.  Limited for excluding basilar atelectasis, infiltrate or mass.  Tortuous aorta.  Heart size within normal limits.  IMPRESSION: Endotracheal tube removed and tracheostomy tube placed with the tip located midline.  No gross pneumothorax.  There is however, a new wedge-shaped density in the left lung apex.  This cannot be established as being outside the patient.  Small hematoma not excluded.  Attention to this on follow-up.  Please see above.  This is a call report.   Original  Report Authenticated By: Lacy Duverney, M.D.    Ct Portable Head W/o Cm  04/30/2012  *RADIOLOGY REPORT*  Clinical Data: Right-sided weakness  CT HEAD WITHOUT CONTRAST  Technique:  Contiguous axial images were obtained from the base of the skull through the vertex without contrast.  Comparison: None  Findings: Portable equipment was used for the study with decreased resolution compared with standard CT equipment.  Ventricle size is normal.  Negative for hemorrhage or mass.  No midline shift.  No acute infarct is identified.  Sinusitis with air-fluid levels in the maxillary and sphenoid sinus.  IMPRESSION: No acute intracranial abnormality.  This was a portable CT scan.  Sinusitis with air-fluid levels.   Original Report Authenticated By: Janeece Riggers, M.D.     Review of Systems  Constitutional: Negative for fever.  Respiratory: Positive for shortness of breath.        On vent    Blood pressure 122/76, pulse 94, temperature 98.7 F (37.1 C), temperature source Oral, resp. rate 17, height 5\' 10"  (1.778 m), weight 217 lb 6 oz (98.6 kg), SpO2 92.00%. Physical Exam  Cardiovascular: Normal rate, regular rhythm and normal heart sounds.   Respiratory: He has wheezes.       vent  GI: Soft. Bowel sounds are normal. He exhibits distension.     Assessment/Plan Malnutrition Dysphagia; aspiration pneumonia Vent/trach For long term care or  SNF/trach soon Perc g tube in IR 1/24 On Vanco and Zosyn Barium ordered Check kub Consent; LM to son on phone; order in chart    Ardith Lewman A 04/30/2012, 3:09 PM

## 2012-04-30 NOTE — Progress Notes (Signed)
Reviewed chart as part of trach team consult. Patient seen by SLP and discharged prior to a second episode of respiratory distress requiring re-intubation and eventual tracheostomy. Please re-consult SLP for PMSV and swallow evaluations when tolerating trach collar.  Ferdinand Lango MA, CCC-SLP [phone]

## 2012-04-30 NOTE — Care Management Note (Addendum)
Page 1 of 2   05/20/2012     11:33:52 AM   CARE MANAGEMENT NOTE 05/20/2012  Patient:  Cody [name], Cody [name]   Account Number:  [id number]  Date Initiated:  04/16/2012  Documentation initiated by:  [facility]  Subjective/Objective Assessment:   Admitted with resp failure - on vent.  has wife.     Action/Plan:   Anticipated DC Date:     Anticipated DC Plan:  SKILLED NURSING FACILITY  In-house referral  Clinical Social Worker      DC Associate Professor  CM consult      PAC Choice  IP REHAB   Choice offered to / List presented to:             Status of service:  Completed, signed off Medicare Important Message given?   (If response is "NO", the following Medicare IM given date fields will be blank) Date Medicare IM given:   Date Additional Medicare IM given:    Discharge Disposition:  SKILLED NURSING FACILITY  Per UR Regulation:  Reviewed for med. necessity/level of care/duration of stay  If discussed at Long Length of Stay Meetings, dates discussed:   04/21/2012  04/28/2012  04/30/2012  05/07/2012  05/14/2012  05/19/2012    Comments:  Contact:  Karin Lieu Spouse [phone]   [phone]  2/12 1007 debbie Eastyn Dattilo rn,bsn marilyn from cir came by. Myrtha Mantis signing off. they do no feel pt can tolerate 3+hrs of therapies daily. alerted sw so she can cont to follow for snf that accepts trach. peg placed on 2-11.spoke w dr Molli Knock and he has downsized trach. spoke w nse and o2 still35% but they are going to try and dec. snf's will not give [place] bed offer til o2 at 28%. sw aware will be ready soon.  2/8 1113 debbie Susan Bleich rn,bsn pt trach collar at 35%. for snf when trach collar is 28%.  2/7 1151a debbie Rihaan Barrack rn,bsn dr Molli Knock states peg today and getting close to ready for cir.snf. have spoken w marilyn w cir and pt has not progressed and not able to do 3hrs of therapy. marilyn had spoken w fam about this. she will look at pt again on mond 2/10 but prob snf. have spoken w vanessa sw  and alerted her that peg for placemnt today and will prob be ready first of week. will need trach collar at 28% before snf will accept. will cont to follow.  05-04-12 8:50am [name], RNBSN [phone] On vent 50% with trach.  New DVT's and PE's - on heparin, for filter placement today. continues on IV amio.  PEG on 1-24.  1/23 1433 debbie Naysa Puskas rn,bsn have spoken w [facility] and neither can take medicaid or self pay pt's. pt has mediciad application sched for 1-24. have spoken w son eric and fiance lisa sheppard at son's request.  explained ltac and vent snf. explained closet vent snf at [facility] but limited bed and having to look even into [facility] for vent snf bed. sw to work w fam on vent snf. have asked [facility] to place pt on vent snf waiting list.  1/23 1157a debbie Ebany Bowermaster rn,bsn have left message for [facility] to see if [facility] are able to take self pay pt. [facility] cannot take self pay pts. have made sw ref for vent snf. 04-21-12 4:15pm [name], RNBSN - [phone] Continues on vent. added propofol for agitation yesterday and better today.  CM will continue to follow for further needs.

## 2012-04-30 NOTE — Progress Notes (Signed)
eLink Physician-Brief Progress Note Patient Name: Cody [name] DOB: Nov 05, 1950 MRN: [id number]  Date of Service  04/30/2012   HPI/Events of Note  Blood sugars remain elevated on sensitive SSI.  eICU Interventions  Plan: Change q4 hour coverage to moderate SSI   Intervention Category Intermediate Interventions: Hyperglycemia - evaluation and treatment  [name] 04/30/2012, 4:03 AM

## 2012-04-30 NOTE — Progress Notes (Signed)
Occupational Therapy Discharge Patient Details Name: Cody [name] MRN: [id number] DOB: Mar 05, 1951 Today's Date: 04/30/2012 Time:  -     Patient discharged from OT services secondary to splint is not indicated at this time. MD requesting prafo boots for pressure relief. OT spoke directly to RN JJ.  Please see latest therapy progress note for current level of functioning and progress toward goals.    Progress and discharge plan discussed with patient and/or caregiver: Patient unable to participate in discharge planning and no caregivers available  GO     Lucile Shutters Pager: [phone]  04/30/2012, 1:39 PM

## 2012-04-30 NOTE — Progress Notes (Signed)
Dr. [name] inserted 12 f OG tube.  Placement checked.  To do wake up assessment.  Stopped all sedation periodically.  Pt very aggitated.  Respiratory and heart rate going up.  Turned sedation back on at 100% previous sedation.  Pt not moving right side.  Head ct being done.

## 2012-04-30 NOTE — H&P (Signed)
Agree with PA note.    Signed,  Heath K. McCullough, MD Vascular & Interventional Radiologist [facility]  

## 2012-04-30 NOTE — Progress Notes (Signed)
Turned sedation off to evaluate movement of right side.  Pt very aggitated .  Sitting up in bed and swinging at staff and trying to bite.  Turned sedation back on and bolused with fentanyl .  Pt moving all extremities equally

## 2012-04-30 NOTE — H&P (Signed)
Cody [name] is an 62 y.o. male.   Chief Complaint: COPD exacerbation ARDS; on vent 3ppd smoker Asp pneumonia; encephalopathic; delerium Protein calorie malnutrition For long term care placement or SNF/trach Scheduled for percutaneous gastric tube placement in IR 1/24 HPI: CVA; dysphagia; HTN; HLD; GERD; COPD  Past Medical History  Diagnosis Date  . Stroke   . Hypertension   . Hyperlipidemia   . GERD (gastroesophageal reflux disease)   . COPD (chronic obstructive pulmonary disease)     History reviewed. No pertinent past surgical history.  Family History  Problem Relation Age of Onset  . COPD Mother    Social History:  reports that he has been smoking Cigarettes.  He has been smoking about .5 packs per day. He has never used smokeless tobacco. He reports that he does not drink alcohol or use illicit drugs.  Allergies:  Allergies  Allergen Reactions  . Protamine Anaphylaxis    Medications Prior to Admission  Medication Sig Dispense Refill  . amLODipine (NORVASC) 5 MG tablet Take 1 tablet (5 mg total) by mouth daily.  90 tablet  3  . aspirin 81 MG tablet Take 81 mg by mouth daily.        . budesonide-formoterol (SYMBICORT) 160-4.5 MCG/ACT inhaler Inhale 2 puffs into the lungs 2 (two) times daily.  1 Inhaler  3  . hydrochlorothiazide (HYDRODIURIL) 25 MG tablet Take 1 tablet (25 mg total) by mouth daily.  90 tablet  3  . Ibuprofen-Diphenhydramine HCl (ADVIL PM) 200-25 MG CAPS Take 1 capsule by mouth at bedtime as needed. For sleep      . lisinopril (PRINIVIL,ZESTRIL) 40 MG tablet Take 1 tablet (40 mg total) by mouth daily.  90 tablet  3    Results for orders placed during the hospital encounter of 04/12/12 (from the past 48 hour(s))  POCT I-STAT 3, BLOOD GAS (G3+)     Status: Abnormal   Collection Time   04/28/12  5:02 PM      Component Value Range Comment   pH, Arterial 7.352  7.350 - 7.450    pCO2 arterial 66.9 (*) 35.0 - 45.0 mmHg    pO2, Arterial 78.0 (*) 80.0 -  100.0 mmHg    Bicarbonate 37.0 (*) 20.0 - 24.0 mEq/L    TCO2 39  0 - 100 mmol/L    O2 Saturation 94.0      Acid-Base Excess 8.0 (*) 0.0 - 2.0 mmol/L    Patient temperature 99.2 F      Collection site RADIAL, Alucard'S TEST ACCEPTABLE      Drawn by RT      Sample type ARTERIAL      Comment NOTIFIED PHYSICIAN     GLUCOSE, CAPILLARY     Status: Abnormal   Collection Time   04/28/12  5:22 PM      Component Value Range Comment   Glucose-Capillary 178 (*) 70 - 99 mg/dL   HEPARIN LEVEL (UNFRACTIONATED)     Status: Normal   Collection Time   04/28/12  7:30 PM      Component Value Range Comment   Heparin Unfractionated 0.53  0.30 - 0.70 IU/mL   VANCOMYCIN, TROUGH     Status: Normal   Collection Time   04/28/12  7:30 PM      Component Value Range Comment   Vancomycin Tr 16.6  10.0 - 20.0 ug/mL   GLUCOSE, CAPILLARY     Status: Abnormal   Collection Time   04/28/12  7:35 PM  Component Value Range Comment   Glucose-Capillary 155 (*) 70 - 99 mg/dL   GLUCOSE, CAPILLARY     Status: Abnormal   Collection Time   04/28/12 11:26 PM      Component Value Range Comment   Glucose-Capillary 125 (*) 70 - 99 mg/dL   GLUCOSE, CAPILLARY     Status: Abnormal   Collection Time   04/29/12  3:45 AM      Component Value Range Comment   Glucose-Capillary 105 (*) 70 - 99 mg/dL   PHOSPHORUS     Status: Normal   Collection Time   04/29/12  4:46 AM      Component Value Range Comment   Phosphorus 3.3  2.3 - 4.6 mg/dL   CBC     Status: Abnormal   Collection Time   04/29/12  4:46 AM      Component Value Range Comment   WBC 11.3 (*) 4.0 - 10.5 K/uL    RBC 4.91  4.22 - 5.81 MIL/uL    Hemoglobin 14.8  13.0 - 17.0 g/dL    HCT 45.4  09.8 - 11.9 %    MCV 95.5  78.0 - 100.0 fL    MCH 30.1  26.0 - 34.0 pg    MCHC 31.6  30.0 - 36.0 g/dL    RDW 14.7 (*) 82.9 - 15.5 %    Platelets 139 (*) 150 - 400 K/uL   HEPARIN LEVEL (UNFRACTIONATED)     Status: Normal   Collection Time   04/29/12  4:46 AM      Component Value  Range Comment   Heparin Unfractionated 0.70  0.30 - 0.70 IU/mL   PROCALCITONIN     Status: Normal   Collection Time   04/29/12  4:46 AM      Component Value Range Comment   Procalcitonin 0.17     ABO/RH     Status: Normal   Collection Time   04/29/12  4:50 AM      Component Value Range Comment   ABO/RH(D) O POS     GLUCOSE, CAPILLARY     Status: Abnormal   Collection Time   04/29/12  8:45 AM      Component Value Range Comment   Glucose-Capillary 114 (*) 70 - 99 mg/dL   PREPARE FRESH FROZEN PLASMA     Status: Normal   Collection Time   04/29/12 12:00 PM      Component Value Range Comment   Unit Number [id number]      Blood Component Type THAWED PLASMA      Unit division 00      Status of Unit ISSUED,FINAL      Transfusion Status OK TO TRANSFUSE      Unit Number [id number]      Blood Component Type THAWED PLASMA      Unit division 00      Status of Unit ISSUED,FINAL      Unit tag comment VERBAL ORDERS PER DR [name]      Transfusion Status OK TO TRANSFUSE     TYPE AND SCREEN     Status: Normal   Collection Time   04/29/12  1:20 PM      Component Value Range Comment   ABO/RH(D) O POS      Antibody Screen NEG      Sample Expiration 05/02/2012     CBC     Status: Abnormal   Collection Time   04/29/12  1:28 PM      Component  Value Range Comment   WBC 10.5  4.0 - 10.5 K/uL    RBC 5.49  4.22 - 5.81 MIL/uL    Hemoglobin 16.9  13.0 - 17.0 g/dL    HCT 56.2 (*) 13.0 - 52.0 %    MCV 96.2  78.0 - 100.0 fL    MCH 30.8  26.0 - 34.0 pg    MCHC 32.0  30.0 - 36.0 g/dL    RDW 86.5 (*) 78.4 - 15.5 %    Platelets 131 (*) 150 - 400 K/uL   PROTIME-INR     Status: Normal   Collection Time   04/29/12  1:28 PM      Component Value Range Comment   Prothrombin Time 12.7  11.6 - 15.2 seconds    INR 0.96  0.00 - 1.49   GLUCOSE, CAPILLARY     Status: Abnormal   Collection Time   04/29/12  5:12 PM      Component Value Range Comment   Glucose-Capillary 193 (*) 70 - 99 mg/dL   GLUCOSE,  CAPILLARY     Status: Abnormal   Collection Time   04/29/12  8:04 PM      Component Value Range Comment   Glucose-Capillary 190 (*) 70 - 99 mg/dL   GLUCOSE, CAPILLARY     Status: Abnormal   Collection Time   04/30/12 12:17 AM      Component Value Range Comment   Glucose-Capillary 206 (*) 70 - 99 mg/dL   GLUCOSE, CAPILLARY     Status: Abnormal   Collection Time   04/30/12  3:59 AM      Component Value Range Comment   Glucose-Capillary 217 (*) 70 - 99 mg/dL   PHOSPHORUS     Status: Normal   Collection Time   04/30/12  5:00 AM      Component Value Range Comment   Phosphorus 3.1  2.3 - 4.6 mg/dL   CBC     Status: Abnormal   Collection Time   04/30/12  5:00 AM      Component Value Range Comment   WBC 12.5 (*) 4.0 - 10.5 K/uL    RBC 4.98  4.22 - 5.81 MIL/uL    Hemoglobin 14.9  13.0 - 17.0 g/dL    HCT 69.6  29.5 - 28.4 %    MCV 93.6  78.0 - 100.0 fL    MCH 29.9  26.0 - 34.0 pg    MCHC 32.0  30.0 - 36.0 g/dL    RDW 13.2 (*) 44.0 - 15.5 %    Platelets 143 (*) 150 - 400 K/uL   HEPARIN LEVEL (UNFRACTIONATED)     Status: Abnormal   Collection Time   04/30/12  5:00 AM      Component Value Range Comment   Heparin Unfractionated <0.10 (*) 0.30 - 0.70 IU/mL   COMPREHENSIVE METABOLIC PANEL     Status: Abnormal   Collection Time   04/30/12  5:00 AM      Component Value Range Comment   Sodium 136  135 - 145 mEq/L    Potassium 4.5  3.5 - 5.1 mEq/L    Chloride 96  96 - 112 mEq/L    CO2 35 (*) 19 - 32 mEq/L    Glucose, Bld 209 (*) 70 - 99 mg/dL    BUN 28 (*) 6 - 23 mg/dL    Creatinine, Ser 1.02  0.50 - 1.35 mg/dL    Calcium 8.6  8.4 - 72.5 mg/dL    Total  Protein 5.6 (*) 6.0 - 8.3 g/dL    Albumin 2.0 (*) 3.5 - 5.2 g/dL    AST 17  0 - 37 U/L    ALT 23  0 - 53 U/L    Alkaline Phosphatase 61  39 - 117 U/L    Total Bilirubin 0.7  0.3 - 1.2 mg/dL    GFR calc non Af Amer >90  >90 mL/min    GFR calc Af Amer >90  >90 mL/min   MAGNESIUM     Status: Normal   Collection Time   04/30/12  5:00 AM       Component Value Range Comment   Magnesium 2.1  1.5 - 2.5 mg/dL   POCT I-STAT 3, BLOOD GAS (G3+)     Status: Abnormal   Collection Time   04/30/12  8:39 AM      Component Value Range Comment   pH, Arterial 7.337 (*) 7.350 - 7.450    pCO2 arterial 68.1 (*) 35.0 - 45.0 mmHg    pO2, Arterial 84.0  80.0 - 100.0 mmHg    Bicarbonate 36.4 (*) 20.0 - 24.0 mEq/L    TCO2 38  0 - 100 mmol/L    O2 Saturation 95.0      Acid-Base Excess 7.0 (*) 0.0 - 2.0 mmol/L    Patient temperature 98.9 F      Collection site RADIAL, Ethel'S TEST ACCEPTABLE      Drawn by RT      Sample type ARTERIAL      Comment NOTIFIED PHYSICIAN     GLUCOSE, CAPILLARY     Status: Abnormal   Collection Time   04/30/12 12:07 PM      Component Value Range Comment   Glucose-Capillary 183 (*) 70 - 99 mg/dL    Dg Chest Port 1 View  04/29/2012  *RADIOLOGY REPORT*  Clinical Data: Question of expanding left apical hematoma  PORTABLE CHEST - 1 VIEW  Comparison: Chest x-ray of 04/29/2012  Findings: The triangular opacity within the left upper lobe near the apex is stable.  Bibasilar atelectasis and possible small effusions remain.  Cardiomegaly is stable.  Tracheostomy and right central venous line are unchanged.  IMPRESSION: 1.  Stable triangular opacity in the left lung apex. 2.  Stable bibasilar atelectasis.   Original Report Authenticated By: Dwyane Dee, M.D.    Chest Portable 1 View To Assess Tube Placement And Rule-out Pneumothorax  04/29/2012  *RADIOLOGY REPORT*  Clinical Data: Tube placement.  Rule out pneumothorax.  PORTABLE CHEST - 1 VIEW  Comparison: 04/28/2012.  Findings: Endotracheal tube removed and tracheostomy tube placed with the tip located midline.  No gross pneumothorax.  There is however, a new wedge-shaped density in the left lung apex.  This cannot be established as being outside the patient.  Small hematoma not excluded.  Attention to this on follow-up.  Right central line tip just proximal to the formation of the  superior vena cava.  Pulmonary edema.  Elevated hemidiaphragms.  Pleural effusions may be present.  Limited for excluding basilar atelectasis, infiltrate or mass.  Tortuous aorta.  Heart size within normal limits.  IMPRESSION: Endotracheal tube removed and tracheostomy tube placed with the tip located midline.  No gross pneumothorax.  There is however, a new wedge-shaped density in the left lung apex.  This cannot be established as being outside the patient.  Small hematoma not excluded.  Attention to this on follow-up.  Please see above.  This is a call report.   Original  Report Authenticated By: Lacy Duverney, M.D.    Ct Portable Head W/o Cm  04/30/2012  *RADIOLOGY REPORT*  Clinical Data: Right-sided weakness  CT HEAD WITHOUT CONTRAST  Technique:  Contiguous axial images were obtained from the base of the skull through the vertex without contrast.  Comparison: None  Findings: Portable equipment was used for the study with decreased resolution compared with standard CT equipment.  Ventricle size is normal.  Negative for hemorrhage or mass.  No midline shift.  No acute infarct is identified.  Sinusitis with air-fluid levels in the maxillary and sphenoid sinus.  IMPRESSION: No acute intracranial abnormality.  This was a portable CT scan.  Sinusitis with air-fluid levels.   Original Report Authenticated By: Janeece Riggers, M.D.     Review of Systems  Constitutional: Negative for fever.  Respiratory: Positive for shortness of breath.        On vent    Blood pressure 122/76, pulse 97, temperature 98.7 F (37.1 C), temperature source Oral, resp. rate 17, height 5\' 10"  (1.778 m), weight 217 lb 6 oz (98.6 kg), SpO2 92.00%. Physical Exam  Cardiovascular: Normal rate, regular rhythm and normal heart sounds.   Respiratory: He has wheezes.       vent  GI: Soft. Bowel sounds are normal. He exhibits distension.     Assessment/Plan Malnutrition Dysphagia; aspiration pneumonia Vent/trach For long term care or  SNF/trach soon Perc g tube in IR 1/24 On Vanco and Zosyn Barium ordered Check kub Consent signed and in chart Pt on steroids; wbc 12.1   Cody [name] A 04/30/2012, 3:17 PM

## 2012-04-30 NOTE — Progress Notes (Signed)
PULMONARY  / CRITICAL CARE MEDICINE  Name: Cody [name] MRN: [id number] DOB: 11/21/1950    LOS: 18  REFERRING MD:   EDP - Dr. Preston Fleeting  CHIEF COMPLAINT:  Shortness of breath  BRIEF PATIENT DESCRIPTION: 62 yo M with COPD and 3ppd smoking admitted 04/12/2012 with acute respiratory failure 2/2 influenza A and aspiration PNA, required intubated 04/13/11 for airway protection and aspirated during intubation. Extubated 1/15 but reintubated emergently 1/18 for severe delerium & resp distress, purulent secretions noted. Trach 1/22.  LINES / TUBES: ETT 1/5 >>1/15 , 1/18 >> self extubated 1/21 >>> reintubated 1/21 L IJ 1/5 >> 1/13 R IJ 1/13 >> 1/16 RIJ 1/18 >>  R A-line 1/5 >>1/8 pulled out by pt Trach 1/22  CULTURES: Blood culture 1/5>> Negative Resp culture 1/6>> H. influenzae  Resp viral panel 1/6>> Influenza A and Metapneumovirus  Sputum Leigonella Culture 1/6>> Negative Bronch Sputum 1/6>> H. influenzae  Urine 1/5>> Neg  Urine 1/6>> Neg  Flu 1/5>> Neg Blood culture 01/13 >> NG Catheter tip culture 1/13 >>Staph -coag neg-70 colonies(vanc sens)  C.diff PCR 1/14 >> Negative Resp culture 1/18 >>> Negative  ANTIBIOTICS: Zosyn 1/5>>1/9 - then restarted 1/22 >>> Azithro 1/5>>1/8  Vanc 1/6>>>1/8 - then restarted 1/13 >>> Tamiflu 1/6>>>1/16  Rocephin 1/9>>1/15 Ceftazidine 1/18 >>1/22   SIGNIFICANT EVENTS:  1/5 Intubated in ED  1/5 Shock , levophed  1/6 ECHO - 55%, mod dil rv, PA pressures>>> not measured b/c no TR jet  1/6 Bronched- diffuse pus all lobes  1/7 increasing pCO2, increased RR on vent  1/8 Changed sedation to Precedex, more agitated than with versed  1/9 Changed sedation back to Versed, agitation greatly improved  1/10 scheduled haldol  1/12 Restarting precedex gtt due to persistent agitation 1/13 Febrile to 102.6 overnight. ? Catheter infection? DC'ed L IJ, placed new R IJ. 1/14 No fevers overnight, agitation controlled on propofol (added 1/13), more  interactive. 1/15 Had foul smelling stools overnight, therefore, C.diff PCR was sent by RN. 1/15 EXTUBATED, developed A.fib with RVR overnight. 1/16 Persistent episodes of desaturation., afib>started on cardizem drip  1/17 increased agitation  1/18 reintubated  1/20 Persistent A. Fib this morning on dilt drip.  Rates controlled between 99-125 1/21 Self extubated, given a chance and continued to be in respiratory distress so reintubated.  1/22 Tracheostomy, complicated by bleeding, angio edema from protamine  LEVEL OF CARE:  ICU PRIMARY SERVICE:  PCCM CONSULTANTS:  None CODE STATUS: Full DIET:  TF DVT Px:  Heparin GI Px:  protonix   SUBJECTIVE / INTERVAL HISTORY:  No acute events overnight, trach in place, no further bleeding. Heavily sedated.    VITAL SIGNS: Temp:  [98.5 F (36.9 C)-98.9 F (37.2 C)] 98.9 F (37.2 C) (01/23 0800) Pulse Rate:  [65-103] 80  (01/23 0800) Resp:  [17-21] 18  (01/23 0800) BP: (87-153)/(62-85) 110/74 mmHg (01/23 0800) SpO2:  [94 %-99 %] 98 % (01/23 0800) FiO2 (%):  [60 %-100 %] 70 % (01/23 0600) Weight:  [217 lb 6 oz (98.6 kg)] 217 lb 6 oz (98.6 kg) (01/23 0436) HEMODYNAMICS: VENTILATOR SETTINGS: Vent Mode:  [-] PRVC FiO2 (%):  [60 %-100 %] 70 % Set Rate:  [18 bmp] 18 bmp Vt Set:  [500 mL] 500 mL PEEP:  [10 cmH20] 10 cmH20 Plateau Pressure:  [14 cmH20-24 cmH20] 24 cmH20 INTAKE / OUTPUT: Intake/Output      01/22 0701 - 01/23 0700 01/23 0701 - 01/24 0700   I.V. (mL/kg) 1366.8 (13.9)    Blood  623    NG/GT 60    IV Piggyback 1212.5    Total Intake(mL/kg) 3262.3 (33.1)    Urine (mL/kg/hr) 2560 (1.1)    Total Output 2560    Net +702.3          PHYSICAL EXAMINATION: General: Sedated and intubated   Head: Normocephalic, Trach in place, no bleeding.  Lungs:  Diminished BS, course rhonchi.    Heart: Irregularly irregular rhythm, regular rate.  Abdomen:  BS normoactive. Soft, Nondistended, non-tender.  No masses or organomegaly.   Extremities: No pretibial edema.    IMAGING: 1/19 - pCXR - Similar bibasilar volume loss with probable small left pleural effusion.  1/22 - pCXR - Endotracheal tube removed and tracheostomy tube placed with the tip located midline. No gross pneumothorax. There is however, a new wedge-shaped density in the left lung apex.    DIAGNOSES: Principal Problem:  *Acute respiratory failure with hypercapnia Active Problems:  HYPERLIPIDEMIA  TOBACCO USE  HYPERTENSION  GERD  COPD (chronic obstructive pulmonary disease)  Acute encephalopathy  Acute kidney injury  Atrial fibrillation  ASSESSMENT / PLAN:  PULMONARY  Lab 04/28/12 1702 04/27/12 0355 04/26/12 0854  PHART 7.352 7.382 7.387  PCO2ART 66.9* 57.0* 54.8*  PO2ART 78.0* 64.7* 56.0*  HCO3 37.0* 33.2* 32.9*  TCO2 39 35.0 35   A: 1) Acute hypercapnic respiratory failure/ARDS - secondary to flu, COPD exacerbation. Requiring increased FiO2 /PEEP 1/13 likely contributed by agitation. Extubated 1/15.>reintubate 1/18 self extubated 1/21 > reintubated 1/2. Trach 1/22. 2) Aspiration - at time of intubation, finished course of treatment , restarted Vanc 1/13 fever. 3) Pneumonitis vs. CAP - b/l interstitial opacities on cxr, slightly increased at left, likely atelectasis. Completed 8/8 days rocephin , on tapering steroids  4) Pulmonary edema - will likely need negative balance today.  Up almost 1.8L since the day before - good urine output. 4) Non-oxygen dependent COPD - h/o chronic tobacco abuse hx 3 pack per day 20+ years. On symbicort at home. Not on home O2 therapy. 5) Bloody secretions from aspiration - Pulm /renal syndrome ruled out. 6) upper airway severe angio edema from protamine  7)likley small hematoma on rt apex from neck oozing  P:  - peep to 10, requires 70%, may need may increase peep to 12 in hopes to reduce fio2 to 50%- abg this am appears may not correlate well - Duonebs q6h + q2h PRN. - On Solumedrol post-trach for angio  edema, reduce , taper over 3 days - Smoking cessation education will be needed. - Lasix IV to keep neg bal as scr allows  pepcid Dc benadryl  CARDIOVASCULAR  Lab 04/25/12 0903 04/25/12 0445 04/24/12 2148  CKTOTAL -- -- --  CKMB -- -- --  TROPONINI <0.30 <0.30 <0.30    Lab 04/27/12 0344  PROBNP 241.4*   A:  1) New onset atrial fibrillation with RVR - on cardizem PO and Hep prior to trach 1/22. Converted to NSR and has remained. Did not restart IV cardizem.  2) Elevated proBNP - ProBNP on admission 17911 --> 398 on 1/12. Trop x3 negative. ECHO with normal systolic and diastolic dysfunction, EF 50-55%.  3) HTN -   home regimen Lisinopril 40mg , Norvasc 5mg , and HCTZ 25mg  qd. -on hold  4) Hypotension - Resolved. Initially contributed by septic shock likely, previously requiring pressors. Resolved.  5) Anaphylaxis to protamine - protamine administered to reverse heparin in setting of trach, anaphylactic reaction. Solumedrol, pepcid, benadryl given with stabilization. Protamine added to allergy list.  PLAN:  - Goal HR < 120. - Continue lasix.  - WILL HOLD HEPARIN x 1 week in setting life threatening bleeding and for indication fib - Continue solumedrol and pepcid for at least 2 additional days. - Protamine added to allergy list.   RENAL  Lab 04/30/12 0500 04/29/12 0446 04/28/12 0455 04/27/12 0348 04/25/12 0445 04/24/12 2147  NA 136 -- 139 137 -- --  K 4.5 -- 3.6 3.6 -- --  CL 96 -- 102 100 -- --  CO2 35* -- 31 32 -- --  BUN 28* -- 30* 39* -- --  CREATININE 0.60 -- 0.64 0.74 -- --  GLUCOSE 209* -- 176* 134* -- --  MG 2.1 -- -- -- 1.9 1.9  PHOS 3.1 3.3 2.7 -- -- --    Intake/Output Summary (Last 24 hours) at 04/30/12 0817 Last data filed at 04/30/12 0651  Gross per 24 hour  Intake 3119.53 ml  Output   2435 ml  Net 684.53 ml    A:  1) Acute Kidney injury - Resolved. Previously likely pre-renal in setting of volume depletion. Previously urinary sediment clogging foley,  improved. Continue to monitor in setting of lasix use. 2) Hypernatremia - resolved  3) Hypokalemia - in setting of lasix. Resolved.  P:  - Continue KVO IVFs, goal to keep even. - Check AM BMET. - Continue to monitor renal function. - Lasix at current 40mg  IV qday, and KVO.  GASTROINTESTINAL  Lab 04/30/12 0500 04/24/12 2147  AST 17 26  ALT 23 25  ALKPHOS 61 72  BILITOT 0.7 1.5*  PROT 5.6* 6.9  ALBUMIN 2.0* 3.0*   A:  1) GERD 2) Diarrhea - resolving. C. Diff negative.  P: - Start tf, I will place ngt with direct larygoscope - PPI on vent -poor vent needs, unlikely to progress, need to peg  HEMATOLOGIC  Lab 04/30/12 0500 04/29/12 1328 04/29/12 0446 04/28/12 0157 04/25/12 0445  WBC 12.5* 10.5 11.3* -- --  HGB 14.9 16.9 14.8 -- --  HCT 46.6 52.8* 46.9 -- --  PLT 143* 131* 139* -- --  APTT -- -- -- -- --  INR -- 0.96 -- 1.04 1.54*    A:  1) Polycythemia- Hgb stable.  2) Leukocytosis -  Increased this AM, likely in setting of high dose steroids - no fever. Prior DC of left IJ on 1/13 (cath tip infection-staph/coag neg). 3) Anticoagulation in setting of atrial fibrillation - CHADS score of 3. Heparin and coumadin held prior to trach 1/22 and coags wnl.  P:  - Consider resume heparin in 1 week ( had additional vessels also that rebleed) - No coumadin for now. scd  INFECTIOUS  Lab 04/29/12 0446 04/28/12 0455 04/27/12 1230  LATICACIDVEN -- -- --  PROCALCITON 0.17 0.15 0.14   1) Flu pneumonia - Flu A and metapneumovirus >finished full 10-day course of Tamiflu, droplet isolation d/c 1/18 2) Aspirated at time of intubation, pneumonitis vs PNA>finished flu course of Rocephin. Aspirated again during reintubation.  3) Nosocomial infection -  Left IJ (in 8 days) was dc'ed on 1/13 and catheter tip cultures>staph/coag neg . Was resumed Vanc on 1/13 -->  4) Diarrhea - C.diff neg. 5) Leukocytosis - likely 2/2 high dose steroids. Remains afebrile, Ceftaz added 1/18 w/ sputum cx  negative. Procalcitonin 0.14 --> 0.15.   P:  - Continue Zosyn day 2 of likely 5-7 for aspiration PNA. - Continue to monitor for s/s of additional infection.  ENDOCRINE  Lab 04/30/12 0359 04/30/12 0017  04/29/12 2004 04/29/12 1712 04/29/12 0845  GLUCAP 217* 206* 190* 193* 114*   Lab Results  Component Value Date   HGBA1C 7.1* 04/24/2012   TSH 1.494 04/25/2012    A:  1) Diabetes mellitus, type 2 (A1c 7.1) - possible iatrogenic secondary to prolonged steroid use. 2) On steroids - CBGs at goal. Lantus off P:  - Cont ICU Hyperglycemic protocol - escalate scale. - CBG monitoring q4h while NPO. - Continue steroids.   NEUROLOGIC / PSYCHIATRIC   A:  1) Acute encephalopathy - Resolved. Likely secondary to hypoxia / hypercarbia and possible infectious process (CAP?). >returned 1/18 >requiring intubation  2) Delerium - returned 1/18 , improved w/ sedation propofol /fent, although again agitation significant when off of propofol. Heavy sedation post trach.  P:  - Continue propofol + fentanyl + roc --> start weaning soon, dc paralysis , then wua - Delirium, avoid benzo - Resume seroquel when able to obtain gastric access. - Monitor QTc. -will need ltach consult, pt/ot  CLINICAL SUMMARY: 62 yo M with COPD and 3ppd smoking admitted 04/12/2012 with acute respiratory failure 2/2 influenza A and aspiration PNA, required intubated 04/13/11 for airway protection and aspirated during intubation. Extubated 1/15 but reintubated emergently 1/18 for severe delerium & resp distress, purulent secretions noted. Trach 1/22, still requiring heavy sedation. 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 Goal now WUA, fio2 reduction, peg.   Signed: Johnette Abraham, Roma Schanz, Internal Medicine Resident Pager: [phone] (7AM-5PM) 04/30/2012, 8:17 AM    Ccm time 35 min   I have fully examined this patient and agree with above findings.    And edite di  nfull  [name]. [name], MD, FACP Pgr: [phone] [facility]

## 2012-04-30 NOTE — H&P (Signed)
Agree with PA note.    Signed,  Heath K. McCullough, MD Vascular & Interventional Radiologist [facility]  

## 2012-05-01 ENCOUNTER — Inpatient Hospital Stay (HOSPITAL_COMMUNITY): Payer: Medicaid Other

## 2012-05-01 LAB — GLUCOSE, CAPILLARY
Glucose-Capillary: 146 mg/dL — ABNORMAL HIGH (ref 70–99)
Glucose-Capillary: 148 mg/dL — ABNORMAL HIGH (ref 70–99)
Glucose-Capillary: 155 mg/dL — ABNORMAL HIGH (ref 70–99)
Glucose-Capillary: 167 mg/dL — ABNORMAL HIGH (ref 70–99)
Glucose-Capillary: 180 mg/dL — ABNORMAL HIGH (ref 70–99)

## 2012-05-01 LAB — BASIC METABOLIC PANEL
Calcium: 9.1 mg/dL (ref 8.4–10.5)
GFR calc Af Amer: >90 mL/min (ref >90)
GFR calc non Af Amer: >90 mL/min (ref >90)
Glucose, Bld: 147 mg/dL — ABNORMAL HIGH (ref 70–99)
Potassium: 4.2 mEq/L (ref 3.5–5.1)
Sodium: 138 mEq/L (ref 135–145)

## 2012-05-01 LAB — CBC
Hemoglobin: 14 g/dL (ref 13.0–17.0)
MCH: 29.2 pg (ref 26.0–34.0)
MCHC: 31.3 g/dL (ref 30.0–36.0)
RDW: 16.2 % — ABNORMAL HIGH (ref 11.5–15.5)

## 2012-05-01 LAB — MAGNESIUM: Magnesium: 2.2 mg/dL (ref 1.5–2.5)

## 2012-05-01 LAB — PHOSPHORUS: Phosphorus: 2.7 mg/dL (ref 2.3–4.6)

## 2012-05-01 LAB — TRIGLYCERIDES: Triglycerides: 196 mg/dL — ABNORMAL HIGH (ref <150)

## 2012-05-01 MED ORDER — PRO-STAT SUGAR FREE PO LIQD
60.0000 mL | Freq: Three times a day (TID) | ORAL | Status: DC
  Administered 2012-05-01 – 2012-05-05 (×12): 60 mL via ORAL
  Filled 2012-05-01 (×15): qty 60

## 2012-05-01 MED ORDER — QUETIAPINE FUMARATE 50 MG PO TABS
50.0000 mg | ORAL_TABLET | Freq: Two times a day (BID) | ORAL | Status: DC
  Administered 2012-05-01 – 2012-05-03 (×5): 50 mg via ORAL
  Filled 2012-05-01 (×7): qty 1

## 2012-05-01 MED ORDER — LORAZEPAM 1 MG PO TABS
1.0000 mg | ORAL_TABLET | Freq: Two times a day (BID) | ORAL | Status: DC
  Administered 2012-05-01 – 2012-05-06 (×11): 1 mg via ORAL
  Filled 2012-05-01 (×12): qty 1

## 2012-05-01 MED ORDER — METHADONE HCL 5 MG PO TABS
5.0000 mg | ORAL_TABLET | Freq: Four times a day (QID) | ORAL | Status: DC
  Administered 2012-05-01 – 2012-05-06 (×19): 5 mg via ORAL
  Filled 2012-05-01 (×13): qty 1
  Filled 2012-05-01: qty 2
  Filled 2012-05-01 (×5): qty 1

## 2012-05-01 MED ORDER — QUETIAPINE FUMARATE 100 MG PO TABS
100.0000 mg | ORAL_TABLET | Freq: Two times a day (BID) | ORAL | Status: DC
  Filled 2012-05-01 (×2): qty 1

## 2012-05-01 MED ORDER — METHYLPREDNISOLONE SODIUM SUCC 125 MG IJ SOLR
60.0000 mg | Freq: Two times a day (BID) | INTRAMUSCULAR | Status: DC
  Administered 2012-05-01 – 2012-05-02 (×2): 60 mg via INTRAVENOUS
  Filled 2012-05-01 (×4): qty 0.96

## 2012-05-01 NOTE — Progress Notes (Signed)
PT Cancellation Note  Patient Details Name: Cody [name] MRN: [id number] DOB: 11-04-1950   Cancelled Treatment:    Reason Eval/Treat Not Completed: pt sedated. Will check back as time allows.   [facility] HELEN 05/01/2012, 8:28 AM Pager: [phone]

## 2012-05-01 NOTE — Progress Notes (Signed)
Pt extremely agitated. Waving arms in arm. Very strong. Cannot verbally bring pt down. Elink notified. Per Dr. Vassie Loll keep pts RASS score at -2.  [name], Cody Elizabeth

## 2012-05-01 NOTE — Progress Notes (Signed)
PULMONARY  / CRITICAL CARE MEDICINE  Name: Cody [name] MRN: [id number] DOB: [birth year]    LOS: 19  REFERRING MD:   EDP - Dr. Preston Fleeting  CHIEF COMPLAINT:  Shortness of breath  BRIEF PATIENT DESCRIPTION: 62 yo M with COPD and 3ppd smoking admitted 04/12/2012 with acute respiratory failure 2/2 influenza A and aspiration PNA, required intubated 04/13/11 for airway protection and aspirated during intubation. Extubated 1/15 but reintubated emergently 1/18 for severe delerium & resp distress, purulent secretions noted. Trach 1/22.  LINES / TUBES: ETT 1/5 >>1/15 , 1/18 >> self extubated 1/21 >>> reintubated 1/21 L IJ 1/5 >> 1/13 R IJ 1/13 >> 1/16 RIJ 1/18 >>  R A-line 1/5 >>1/8 pulled out by pt Trach 1/22  CULTURES: Blood culture 1/5>> Negative Resp culture 1/6>> H. influenzae  Resp viral panel 1/6>> Influenza A and Metapneumovirus  Sputum Leigonella Culture 1/6>> Negative Bronch Sputum 1/6>> H. influenzae  Urine 1/5>> Neg  Urine 1/6>> Neg  Flu 1/5>> Neg Blood culture 01/13 >> NG Catheter tip culture 1/13 >>Staph -coag neg-70 colonies(vanc sens)  C.diff PCR 1/14 >> Negative Resp culture 1/18 >>> Negative  ANTIBIOTICS: Zosyn 1/5>>1/9 - then restarted 1/22 >>> Azithro 1/5>>1/8  Vanc 1/6>>>1/8 - then restarted 1/13 >>> Tamiflu 1/6>>>1/16  Rocephin 1/9>>1/15 Ceftazidine 1/18 >>1/22   SIGNIFICANT EVENTS:  1/5 Intubated in ED  1/5 Shock , levophed  1/6 ECHO - 55%, mod dil rv, PA pressures>>> not measured b/c no TR jet  1/6 Bronched- diffuse pus all lobes  1/7 increasing pCO2, increased RR on vent  1/8 Changed sedation to Precedex, more agitated than with versed  1/9 Changed sedation back to Versed, agitation greatly improved  1/10 scheduled haldol  1/12 Restarting precedex gtt due to persistent agitation 1/13 Febrile to 102.6 overnight. ? Catheter infection? DC'ed L IJ, placed new R IJ. 1/14 No fevers overnight, agitation controlled on propofol (added 1/13), more  interactive. 1/15 Had foul smelling stools overnight, therefore, C.diff PCR was sent by RN. 1/15 EXTUBATED, developed A.fib with RVR overnight. 1/16 Persistent episodes of desaturation., afib>started on cardizem drip  1/17 increased agitation  1/18 reintubated  1/20 Persistent A. Fib this morning on dilt drip.  Rates controlled between 99-125 1/21 Self extubated, given a chance and continued to be in respiratory distress so reintubated.  1/22 Tracheostomy, complicated by bleeding, angio edema from protamine 1/23 Continued agitation. 1/23- neg acute ct head 1/24 PEG placement planned.   LEVEL OF CARE:  ICU PRIMARY SERVICE:  PCCM CONSULTANTS:  None CODE STATUS: Full DIET:  TF DVT Px:  Heparin GI Px:  protonix   SUBJECTIVE / INTERVAL HISTORY:  No acute events overnight, trach in place, no further bleeding. Heavily sedated.  Interval History: Agitated with weaning propofol.    VITAL SIGNS: Temp:  [98.5 F (36.9 C)-99.9 F (37.7 C)] 98.8 F (37.1 C) (01/24 0747) Pulse Rate:  [75-129] 116  (01/24 0600) Resp:  [15-25] 22  (01/24 0600) BP: (101-164)/(68-114) 139/85 mmHg (01/24 0600) SpO2:  [90 %-98 %] 90 % (01/24 0600) FiO2 (%):  [50 %-70 %] 50 % (01/24 0600) Weight:  [214 lb 1.1 oz (97.1 kg)] 214 lb 1.1 oz (97.1 kg) (01/24 0500) HEMODYNAMICS: VENTILATOR SETTINGS: Vent Mode:  [-] PRVC FiO2 (%):  [50 %-70 %] 50 % Set Rate:  [18 bmp] 18 bmp Vt Set:  [500 mL] 500 mL PEEP:  [10 cmH20] 10 cmH20 Plateau Pressure:  [18 cmH20-22 cmH20] 18 cmH20 INTAKE / OUTPUT: Intake/Output  01/23 0701 - 01/24 0700 01/24 0701 - 01/25 0700   I.V. (mL/kg) 1148.1 (11.8)    Blood     NG/GT 60    IV Piggyback 160    Total Intake(mL/kg) 1368.1 (14.1)    Urine (mL/kg/hr) 2760 (1.2)    Total Output 2760    Net -1391.9          PHYSICAL EXAMINATION: General: Sedated and intubated   Head: Normocephalic, Trach in place, no bleeding.  Lungs:  Diminished BS, course rhonchi.    Heart:  Irregularly irregular rhythm, regular rate.  Abdomen:  BS normoactive. Soft, Nondistended, non-tender.  No masses or organomegaly.  Extremities: No pretibial edema.    IMAGING: 1/19 - pCXR - Similar bibasilar volume loss with probable small left pleural effusion.  1/22 - pCXR - Endotracheal tube removed and tracheostomy tube placed with the tip located midline. No gross pneumothorax. There is however, a new wedge-shaped density in the left lung apex.  1/24 - KUB - Contrast noted filling the distal esophagus and stomach, progressing into the proximal small bowel.   DIAGNOSES: Principal Problem:  *Acute respiratory failure with hypercapnia Active Problems:  HYPERLIPIDEMIA  TOBACCO USE  HYPERTENSION  GERD  COPD (chronic obstructive pulmonary disease)  Acute encephalopathy  Acute kidney injury  Atrial fibrillation  ASSESSMENT / PLAN:  PULMONARY  Lab 04/30/12 0839 04/28/12 1702 04/27/12 0355  PHART 7.337* 7.352 7.382  PCO2ART 68.1* 66.9* 57.0*  PO2ART 84.0 78.0* 64.7*  HCO3 36.4* 37.0* 33.2*  TCO2 38 39 35.0   A: 1) Acute hypercapnic respiratory failure/ARDS - secondary to flu, COPD exacerbation. Requiring increased FiO2 /PEEP 1/13 likely contributed by agitation. Extubated 1/15.>reintubate 1/18 self extubated 1/21 > reintubated 1/2. Trach 1/22. 2) Aspiration - at time of intubation, finished course of treatment , restarted Vanc 1/13 fever. 3) Pneumonitis vs. CAP - b/l interstitial opacities on cxr, slightly increased at left, likely atelectasis. Completed 8/8 days rocephin , on tapering steroids  4) Pulmonary edema - will likely need negative balance today.  Up almost 1.8L since the day before - good urine output. 4) Non-oxygen dependent COPD - h/o chronic tobacco abuse hx 3 pack per day 20+ years. On symbicort at home. Not on home O2 therapy. 5) Bloody secretions from aspiration - Pulm /renal syndrome ruled out. 6) upper airway severe angio edema from protamine  7)likley small  hematoma on rt apex from neck oozing  P:  - peep of 10, FiO2 to 50%--> goal to peep 8 , successful secondary to lasix likely - Duonebs q6h + q2h PRN. - On Solumedrol post-trach for angio edema, reduce , taper over 3 days, to 60 q12h - Smoking cessation education will be needed. - Lasix IV to keep neg bal as scr allows, did well with this - Continue pepcid and steroids x 1 additional day. -pcxr to follow in am  -no bleeding at trach  CARDIOVASCULAR  Lab 04/25/12 0903 04/25/12 0445 04/24/12 2148  CKTOTAL -- -- --  CKMB -- -- --  TROPONINI <0.30 <0.30 <0.30    Lab 04/27/12 0344  PROBNP 241.4*   A:  1) New onset atrial fibrillation with RVR - on cardizem PO and Hep prior to trach 1/22. Converted to NSR and has remained. Did not restart IV cardizem. Stable overnight. 2) Elevated proBNP - ProBNP on admission 17911 --> 398 on 1/12. Trop x3 negative. ECHO with normal systolic and diastolic dysfunction, EF 50-55%.  3) HTN -   home regimen Lisinopril 40mg , Norvasc 5mg ,  and HCTZ 25mg  qd. -on hold  4) Hypotension - Resolved. Initially contributed by septic shock likely, previously requiring pressors. Resolved.  5) Anaphylaxis to protamine - protamine administered to reverse heparin in setting of trach, anaphylactic reaction. Solumedrol, pepcid, benadryl given with stabilization. Protamine added to allergy list.  PLAN:  - Goal HR < 120. - Continue lasix.  - WILL HOLD HEPARIN x 1 week in setting life threatening bleeding and for indication fib - reduce steroids  RENAL  Lab 05/01/12 0341 04/30/12 0500 04/29/12 0446 04/28/12 0455 04/25/12 0445  NA 138 136 -- 139 --  K 4.2 4.5 -- 3.6 --  CL 96 96 -- 102 --  CO2 38* 35* -- 31 --  BUN 28* 28* -- 30* --  CREATININE 0.57 0.60 -- 0.64 --  GLUCOSE 147* 209* -- 176* --  MG 2.2 2.1 -- -- 1.9  PHOS 2.7 3.1 3.3 -- --    Intake/Output Summary (Last 24 hours) at 05/01/12 0812 Last data filed at 05/01/12 9604  Gross per 24 hour  Intake 1315.31  ml  Output   2760 ml  Net -1444.69 ml    A:  1) Acute Kidney injury - Resolved. Previously likely pre-renal in setting of volume depletion. Previously urinary sediment clogging foley, improved. Continue to monitor in setting of lasix use. 2) Hypernatremia - resolved  3) Hypokalemia - in setting of lasix. Resolved.  P:  - Continue KVO IVFs, goal to keep even. - Check AM BMET. - Continue to monitor renal function. - Lasix at current 40mg  IV qday, and KVO.  GASTROINTESTINAL  Lab 04/30/12 0500 04/24/12 2147  AST 17 26  ALT 23 25  ALKPHOS 61 72  BILITOT 0.7 1.5*  PROT 5.6* 6.9  ALBUMIN 2.0* 3.0*   A:  1) GERD 2) Diarrhea - resolving. C. Diff negative.  P: - Start TF after procedure. - PPI on vent - PEG today, may be delayed with lack of contrast  HEMATOLOGIC  Lab 05/01/12 0341 04/30/12 0500 04/29/12 1328 04/28/12 0157 04/25/12 0445  WBC 15.6* 12.5* 10.5 -- --  HGB 14.0 14.9 16.9 -- --  HCT 44.7 46.6 52.8* -- --  PLT 162 143* 131* -- --  APTT -- -- -- -- --  INR -- -- 0.96 1.04 1.54*    A:  1) Polycythemia- Hgb stable.  2) Leukocytosis -  Increased this AM, likely in setting of high dose steroids and hemoconcetration - no fever. Prior DC of left IJ on 1/13 (cath tip infection-staph/coag neg). 3) Anticoagulation in setting of atrial fibrillation - CHADS score of 3. Heparin and coumadin held prior to trach 1/22 and coags wnl.  P:  - Consider resume heparin in 1 week ( had additional vessels also that rebleed) - No coumadin for now. - SCD  INFECTIOUS  Lab 04/29/12 0446 04/28/12 0455 04/27/12 1230  LATICACIDVEN -- -- --  PROCALCITON 0.17 0.15 0.14   1) Flu pneumonia - Flu A and metapneumovirus >finished full 10-day course of Tamiflu, droplet isolation d/c 1/18 2) Aspirated at time of intubation, pneumonitis vs PNA>finished flu course of Rocephin. Aspirated again during reintubation.  3) Nosocomial infection -  Left IJ (in 8 days) was dc'ed on 1/13 and catheter tip  cultures>staph/coag neg . Was resumed Vanc on 1/13 -->  4) Diarrhea - C.diff neg. 5) Leukocytosis - likely 2/2 high dose steroids. Remains afebrile, Ceftaz added 1/18 w/ sputum cx negative. Procalcitonin 0.14 --> 0.15.   P:  - Continue Zosyn day 3  of likely 7 for aspiration PNA. - Continue to monitor for s/s of additional infection.  ENDOCRINE  Lab 05/01/12 0748 05/01/12 0323 05/01/12 0010 04/30/12 2006 04/30/12 1610  GLUCAP 155* 148* 180* 160* 181*   Lab Results  Component Value Date   HGBA1C 7.1* 04/24/2012   TSH 1.494 04/25/2012    A:  1) Diabetes mellitus, type 2 (A1c 7.1) - possible iatrogenic secondary to prolonged steroid use. 2) On steroids - CBGs at goal. Lantus off. P:  - Cont ICU Hyperglycemic protocol - escalate scale. - CBG monitoring q4h while NPO. - Continue steroids.  NEUROLOGIC / PSYCHIATRIC   A:  1) Acute encephalopathy - Resolved. Likely secondary to hypoxia / hypercarbia and possible infectious process (CAP?). >returned 1/18 >requiring intubation  2) Delerium - persistent, controlled on propofol and fentanyl, requiring high doses, inhibiting weaning.   P:  - Continue propofol + fentanyl, wean as able. Goal to dc propofol -add methadone 5 mg q6h to reduce fentanyl - Delirium, avoid benzos - Resume seroquel. - PRN haldol after procedure - Monitor QTc. - Does not qualify for LTAC, SNF/trach beds requested (possibly out of state) - PT/ OT consult. -ativan 1 q12h  CLINICAL SUMMARY: 62 yo M with COPD and 3ppd smoking admitted 04/12/2012 with acute respiratory failure 2/2 influenza A and aspiration PNA, required intubated 04/13/11 for airway protection and aspirated during intubation. Extubated 1/15 but reintubated emergently 1/18 for severe delerium & resp distress, purulent secretions noted. Trach 1/22, still requiring heavy sedation. 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 Plan PEG 1/24? Marland Kitchen Add oral meds to  dc sedation. Stay in ICU with risk rebleed.  Family updated   Signed: Johnette Abraham, Roma Schanz, Internal Medicine Resident Pager: [phone] (7AM-5PM) 05/01/2012, 8:12 AM     CCM time 30 min   I have fully examined this patient and agree with above findings.    And edited in full  [name]. [name], MD, FACP Pgr: [phone] [facility]

## 2012-05-01 NOTE — Progress Notes (Signed)
NUTRITION FOLLOW UP  Intervention:   Continue previous TF regimen until able to have PEG placed.   RD will continue to follow  Nutrition Dx:   Inadequate oral intake, ongoing  Goal:   Intake to meet >/=90% estimated nutrition needs. Unmet at this time.    Monitor:   Vent status, PEG placement, weight trends, I/O's  Assessment:   Pt with trach, remains on vent support. Was planned for PEG today but was not able to proceed, barium not yet to bowels. Procedure has been postponed until Monday.   Current TF regimen: Vital 1.5 @ 20 ml/hr via OG tube, 60 ml Pro-stat TID. This EN regimen is providing: 1320 kcal, 122 gm protein, and 367 ml free water daily.  Continues on Propofol, RN attempted to wean down, became agitated and required increased Propofol rate.   Height: Ht Readings from Last 1 Encounters:  09/19/10 5' 10.5" (1.791 m)    Weight Status:   Wt Readings from Last 1 Encounters:  05/01/12 214 lb 1.1 oz (97.1 kg)   Patient is currently intubated on ventilator support.  MV: 14.6, has been averaging about 9 Temp:Temp (24hrs), Avg:98.9 F (37.2 C), Min:98.5 F (36.9 C), Max:99.9 F (37.7 C)  Propofol: 22.8 ml/hr providing  602 kcal from lipids per day   Re-estimated needs:  Kcal: 2068 Protein: >/=146 gm  Fluid: >/= 1.2 L daily   Skin: moisture associated wound to inner thigh   Diet Order: NPO   Intake/Output Summary (Last 24 hours) at 05/01/12 1043 Last data filed at 05/01/12 1003  Gross per 24 hour  Intake 1656.71 ml  Output   3085 ml  Net -1428.29 ml    Last BM: 1/22   Labs:   Lab 05/01/12 0341 04/30/12 0500 04/29/12 0446 04/28/12 0455 04/25/12 0445  NA 138 136 -- 139 --  K 4.2 4.5 -- 3.6 --  CL 96 96 -- 102 --  CO2 38* 35* -- 31 --  BUN 28* 28* -- 30* --  CREATININE 0.57 0.60 -- 0.64 --  CALCIUM 9.1 8.6 -- 8.1* --  MG 2.2 2.1 -- -- 1.9  PHOS 2.7 3.1 3.3 -- --  GLUCOSE 147* 209* -- 176* --    CBG (last 3)   Basename 05/01/12 0748  05/01/12 0323 05/01/12 0010  GLUCAP 155* 148* 180*    Scheduled Meds:   . antiseptic oral rinse  15 mL Mouth Rinse QID  . chlorhexidine  15 mL Mouth Rinse BID  . famotidine (PEPCID) IV  20 mg Intravenous Q12H  . feeding supplement  60 mL Oral TID  . fentaNYL  200 mcg Intravenous Once  . furosemide  40 mg Intravenous Daily  . insulin aspart  0-15 Units Subcutaneous Q4H  . ipratropium  0.5 mg Nebulization Q6H  . levalbuterol  0.63 mg Nebulization Q6H  . LORazepam  1 mg Oral BID  . methadone  5 mg Oral Q6H  . methylPREDNISolone (SOLU-MEDROL) injection  60 mg Intravenous Q12H  . multivitamin  5 mL Per Tube Daily  . piperacillin-tazobactam (ZOSYN)  IV  3.375 g Intravenous Q8H  . QUEtiapine  50 mg Oral BID  . sodium chloride  500 mL Intravenous Once  . vancomycin  1,000 mg Intravenous Q8H    Continuous Infusions:   . sodium chloride 10 mL/hr at 04/30/12 1900  . feeding supplement (VITAL 1.5 CAL) 1,000 mL (04/30/12 2112)  . fentaNYL infusion INTRAVENOUS 200 mcg/hr (05/01/12 0600)  . norepinephrine (LEVOPHED) Adult infusion    .  propofol 40 mcg/kg/min (05/01/12 1000)    Clarene Duke RD, LDN Pager [phone] After Hours pager [phone]

## 2012-05-01 NOTE — Clinical Social Work Psychosocial (Signed)
     Clinical Social Work Department BRIEF PSYCHOSOCIAL ASSESSMENT 05/01/2012  Patient:  Cody [name], Cody [name]     Account Number:  [id number]     Admit date:  04/12/2012  Clinical Social Worker:  Hulan Fray  Date/Time:  05/01/2012 11:26 AM  Referred by:  Care Management  Date Referred:  04/30/2012 Referred for  SNF Placement   Other Referral:   Interview type:  Family Other interview type:   Son- Cody [name] [phone]  Wife- Cody [name]    PSYCHOSOCIAL DATA Living Status:  WIFE Admitted from facility:   Level of care:   Primary support name:  Cody [name] Primary support relationship to patient:  SPOUSE Degree of support available:   supportive    CURRENT CONCERNS Current Concerns  Post-Acute Placement   Other Concerns:    SOCIAL WORK ASSESSMENT / PLAN Clinical Social Worker received referral for vent snf placement at discharge. CSW met with patient's wife and son, Cody [name] to discuss the vent snf process. CSW provided vent snf packet and [place] application. CSW explained that referrals will be sent both to [facility] facilities and [place] facilities. CSW explained the possibility of [place] facilities having availability and that patient could be transferred out of state. Family appeared to be reluctant of this idea of going out of state, but understood that it depended on availability of the facility. Family is hopeful for [facility] vent SNF. CSW will complete FL2 for MD's signature and initiate vent snf search and follow up with family when bed offers are made.   Assessment/plan status:  Psychosocial Support/Ongoing Assessment of Needs Other assessment/ plan:   Information/referral to community resources:   Vent snf packet    PATIENTS/FAMILYS RESPONSE TO PLAN OF CARE: Family appeared understanding of the idea that patient may be transferred out of state, but are hopeful for [facility] to keep patient closer in location. Family were appreciative of CSW's visit.

## 2012-05-01 NOTE — Progress Notes (Signed)
Patient ID: Cody [name], male   DOB: [birth year], 62 y.o.   MRN: [id number]  Given delayed administration of barium, a large amount of barium remains within the stomach and distal esophagus - therefore unable to perform G-tube today.  Will obtain abd XR on Sunday AM in anticipation of performing perc gastrostomy Monday AM.  D/W pt's nurse.

## 2012-05-01 NOTE — Progress Notes (Signed)
ANTIBIOTIC CONSULT NOTE - FOLLOW UP  Pharmacy Consult for vanc/zosyn Indication: HCAP  Allergies  Allergen Reactions  . Protamine Anaphylaxis    Patient Measurements: Height: 5\' 10"  (177.8 cm) Weight: 214 lb 1.1 oz (97.1 kg) IBW/kg (Calculated) : 73   Vital Signs: Temp: 99.3 F (37.4 C) (01/24 1207) Temp src: Oral (01/24 1207) BP: 98/69 mmHg (01/24 1300) Pulse Rate: 82  (01/24 1300) Intake/Output from previous day: 01/23 0701 - 01/24 0700 In: 1458.1 [I.V.:1178.1; NG/GT:60; IV Piggyback:220] Out: 2760 [Urine:2760] Intake/Output from this shift: Total I/O In: 762.5 [I.V.:302.5; IV Piggyback:460] Out: 2325 [Urine:2325]  Labs:  [facility] 05/01/12 0341 04/30/12 0500 04/29/12 1328  WBC 15.6* 12.5* 10.5  HGB 14.0 14.9 16.9  PLT 162 143* 131*  LABCREA -- -- --  CREATININE 0.57 0.60 --   Estimated Creatinine Clearance: 110.4 ml/min (by C-G formula based on Cr of 0.57).  Basename 04/28/12 1930  VANCOTROUGH 16.6  VANCOPEAK --  Drue Dun --  GENTTROUGH --  GENTPEAK --  GENTRANDOM --  TOBRATROUGH --  TOBRAPEAK --  TOBRARND --  AMIKACINPEAK --  AMIKACINTROU --  AMIKACIN --     Microbiology: Recent Results (from the past 720 hour(s))  URINE CULTURE     Status: Normal   Collection Time   04/12/12  7:57 PM      Component Value Range Status Comment   Specimen Description URINE, CATHETERIZED   Final    Special Requests NONE   Final    Culture  Setup Time 04/13/2012 02:43   Final    Colony Count NO GROWTH   Final    Culture NO GROWTH   Final    Report Status 04/14/2012 FINAL   Final   CULTURE, BLOOD (ROUTINE X 2)     Status: Normal   Collection Time   04/12/12 10:15 PM      Component Value Range Status Comment   Specimen Description BLOOD RIGHT HAND   Final    Special Requests BOTTLES DRAWN AEROBIC ONLY 3CC   Final    Culture  Setup Time 04/13/2012 05:42   Final    Culture NO GROWTH 5 DAYS   Final    Report Status 04/19/2012 FINAL   Final   CULTURE, BLOOD  (ROUTINE X 2)     Status: Normal   Collection Time   04/12/12 10:26 PM      Component Value Range Status Comment   Specimen Description BLOOD LEFT HAND   Final    Special Requests BOTTLES DRAWN AEROBIC ONLY 3CC   Final    Culture  Setup Time 04/13/2012 05:42   Final    Culture NO GROWTH 5 DAYS   Final    Report Status 04/19/2012 FINAL   Final   MRSA PCR SCREENING     Status: Normal   Collection Time   04/13/12 12:29 AM      Component Value Range Status Comment   MRSA by PCR NEGATIVE  NEGATIVE Final   URINE CULTURE     Status: Normal   Collection Time   04/13/12 12:33 AM      Component Value Range Status Comment   Specimen Description URINE, CATHETERIZED   Final    Special Requests Normal   Final    Culture  Setup Time 04/13/2012 01:56   Final    Colony Count NO GROWTH   Final    Culture NO GROWTH   Final    Report Status 04/14/2012 FINAL   Final   CULTURE, RESPIRATORY  Status: Normal   Collection Time   04/13/12  4:46 AM      Component Value Range Status Comment   Specimen Description TRACHEAL ASPIRATE   Final    Special Requests Normal   Final    Gram Stain     Final    Value: FEW WBC PRESENT, PREDOMINANTLY PMN     NO SQUAMOUS EPITHELIAL CELLS SEEN     RARE GRAM NEGATIVE RODS   Culture     Final    Value: MODERATE HAEMOPHILUS INFLUENZAE     Note: BETA LACTAMASE NEGATIVE   Report Status 04/15/2012 FINAL   Final   LEGIONELLA CULTURE     Status: Normal   Collection Time   04/13/12  5:01 PM      Component Value Range Status Comment   Specimen Description BRONCHIAL WASHINGS   Final    Special Requests NONE   Final    Culture NO LEGIONELLA ISOLATED   Final    Report Status 04/19/2012 FINAL   Final   CULTURE, RESPIRATORY     Status: Normal   Collection Time   04/13/12  5:01 PM      Component Value Range Status Comment   Specimen Description BRONCHIAL WASHINGS   Final    Special Requests NONE   Final    Gram Stain     Final    Value: ABUNDANT WBC PRESENT,BOTH PMN AND MONONUCLEAR       NO SQUAMOUS EPITHELIAL CELLS SEEN     NO ORGANISMS SEEN   Culture     Final    Value: RARE HAEMOPHILUS INFLUENZAE     Note: BETA LACTAMASE NEGATIVE   Report Status 04/16/2012 FINAL   Final   RESPIRATORY VIRUS PANEL     Status: Abnormal   Collection Time   04/13/12  5:05 PM      Component Value Range Status Comment   Source - RVPAN BRONCHIAL WASHINGS   Final    Respiratory Syncytial Virus A NOT DETECTED   Final    Respiratory Syncytial Virus B NOT DETECTED   Final    Influenza A DETECTED (*)  Final    Influenza B NOT DETECTED   Final    Parainfluenza 1 NOT DETECTED   Final    Parainfluenza 2 NOT DETECTED   Final    Parainfluenza 3 NOT DETECTED   Final    Metapneumovirus DETECTED (*)  Final    Rhinovirus NOT DETECTED   Final    Adenovirus NOT DETECTED   Final    Influenza A H1 NOT DETECTED   Final    Influenza A H3 NOT DETECTED   Final   CULTURE, BLOOD (ROUTINE X 2)     Status: Normal   Collection Time   04/20/12 12:50 AM      Component Value Range Status Comment   Specimen Description BLOOD LEFT ARM   Final    Special Requests BOTTLES DRAWN AEROBIC ONLY 10CC   Final    Culture  Setup Time 04/20/2012 08:43   Final    Culture NO GROWTH 5 DAYS   Final    Report Status 04/26/2012 FINAL   Final   CULTURE, BLOOD (ROUTINE X 2)     Status: Normal   Collection Time   04/20/12  1:10 AM      Component Value Range Status Comment   Specimen Description BLOOD LEFT ARM   Final    Special Requests BOTTLES DRAWN AEROBIC ONLY 10CC   Final  Culture  Setup Time 04/20/2012 08:43   Final    Culture NO GROWTH 5 DAYS   Final    Report Status 04/26/2012 FINAL   Final   CATH TIP CULTURE     Status: Normal   Collection Time   04/20/12  5:06 PM      Component Value Range Status Comment   Specimen Description CATH TIP CENTRAL LINE   Final    Special Requests NONE   Final    Culture     Final    Value: 70 COLONIES STAPHYLOCOCCUS SPECIES (COAGULASE NEGATIVE)     Note: RIFAMPIN AND GENTAMICIN  SHOULD NOT BE USED AS SINGLE DRUGS FOR TREATMENT OF STAPH INFECTIONS.   Report Status 04/24/2012 FINAL   Final    Organism ID, Bacteria STAPHYLOCOCCUS SPECIES (COAGULASE NEGATIVE)   Final   CLOSTRIDIUM DIFFICILE BY PCR     Status: Normal   Collection Time   04/21/12  9:01 PM      Component Value Range Status Comment   C difficile by pcr NEGATIVE  NEGATIVE Final   CULTURE, RESPIRATORY     Status: Normal   Collection Time   04/25/12  5:54 PM      Component Value Range Status Comment   Specimen Description TRACHEAL ASPIRATE   Final    Special Requests NONE   Final    Gram Stain     Final    Value: FEW WBC PRESENT,BOTH PMN AND MONONUCLEAR     NO SQUAMOUS EPITHELIAL CELLS SEEN     NO ORGANISMS SEEN   Culture Non-Pathogenic Oropharyngeal-type Flora Isolated.   Final    Report Status 04/28/2012 FINAL   Final     Anti-infectives     Start     Dose/Rate Route Frequency Ordered Stop   04/29/12 1400  piperacillin-tazobactam (ZOSYN) IVPB 3.375 g       3.375 g 12.5 mL/hr over 240 Minutes Intravenous 3 times per day 04/29/12 1051     04/26/12 1946   vancomycin (VANCOCIN) IVPB 1000 mg/200 mL premix        1,000 mg 200 mL/hr over 60 Minutes Intravenous Every 8 hours 04/26/12 1429     04/25/12 1200   cefTAZidime (FORTAZ) 1 g in dextrose 5 % 50 mL IVPB  Status:  Discontinued        1 g 100 mL/hr over 30 Minutes Intravenous 3 times per day 04/25/12 1106 04/29/12 1051   04/20/12 1215   vancomycin (VANCOCIN) IVPB 1000 mg/200 mL premix  Status:  Discontinued        1,000 mg 200 mL/hr over 60 Minutes Intravenous Every 12 hours 04/20/12 1153 04/26/12 1429   04/17/12 0000   cefTRIAXone (ROCEPHIN) 1 g in dextrose 5 % 50 mL IVPB  Status:  Discontinued        1 g 120 mL/hr over 30 Minutes Intravenous Every 24 hours 04/16/12 1516 04/22/12 1639   04/14/12 1100   vancomycin (VANCOCIN) IVPB 1000 mg/200 mL premix  Status:  Discontinued        1,000 mg 200 mL/hr over 60 Minutes Intravenous Every 12 hours  04/14/12 0821 04/15/12 1005   04/13/12 2300   vancomycin (VANCOCIN) 750 mg in sodium chloride 0.9 % 150 mL IVPB  Status:  Discontinued        750 mg 150 mL/hr over 60 Minutes Intravenous Every 12 hours 04/13/12 1347 04/14/12 0821   04/13/12 1400   oseltamivir (TAMIFLU) 6 MG/ML suspension 150 mg  150 mg Oral 2 times daily 04/13/12 1327 04/22/12 2210   04/13/12 1400   vancomycin (VANCOCIN) 1,500 mg in sodium chloride 0.9 % 500 mL IVPB        1,500 mg 250 mL/hr over 120 Minutes Intravenous  Once 04/13/12 1347 04/13/12 1706   04/13/12 0600  piperacillin-tazobactam (ZOSYN) IVPB 3.375 g       3.375 g 12.5 mL/hr over 240 Minutes Intravenous 3 times per day 04/13/12 0102 04/17/12 0101   04/12/12 2200  piperacillin-tazobactam (ZOSYN) IVPB 3.375 g       3.375 g 100 mL/hr over 30 Minutes Intravenous  Once 04/12/12 2148 04/12/12 2347   04/12/12 2145   azithromycin (ZITHROMAX) 500 mg in dextrose 5 % 250 mL IVPB  Status:  Discontinued        500 mg 250 mL/hr over 60 Minutes Intravenous Every 24 hours 04/12/12 2143 04/15/12 1005          Assessment: 62 year old male with flu pneumonia/HCAP currently on day 19 of abx total, day 3/7 of zosyn and day 11 of vancomycin after restart. No fevers noted, wbc 15.6. Renal function stable, no doses adjustments needed.  CULTURES:  Blood culture 1/5>> Negative  Resp culture 1/6>> H. influenzae  Resp viral panel 1/6>> Influenza A and Metapneumovirus  Sputum Leigonella Culture 1/6>> Negative  Bronch Sputum 1/6>> H. influenzae  Urine 1/5>> Neg  Urine 1/6>> Neg  Flu 1/5>> Neg  Blood culture 01/13 >> NG  Catheter tip culture 1/13 >>Staph -coag neg-70 colonies(vanc sens)  C.diff PCR 1/14 >> Negative  Resp culture 1/18 >>> Negative   ANTIBIOTICS:  Zosyn 1/5>>1/9 - then restarted 1/22 >>>  Azithro 1/5>>1/8  Vanc 1/6>>>1/8 - then restarted 1/13 >>>  Tamiflu 1/6>>>1/16  Rocephin 1/9>>1/15  Ceftazidine 1/18 >>1/22  Goal of Therapy:  Vancomycin  trough 15-20  Plan:  Continue vancomycin 1g q8 hours - plan on trough next week Zosyn 3.375g IV q 8 - follow for stop date  Severiano Gilbert 05/01/2012,1:53 PM

## 2012-05-01 NOTE — Procedures (Signed)
OGT placed i used direct visualization with larygoscope. Airway swollen, place successful oral  Cody [name]. [name], MD, FACP Pgr: [phone] [facility]

## 2012-05-02 ENCOUNTER — Inpatient Hospital Stay (HOSPITAL_COMMUNITY): Payer: Medicaid Other

## 2012-05-02 LAB — BLOOD GAS, ARTERIAL
Acid-Base Excess: 14.4 mmol/L — ABNORMAL HIGH (ref 0.0–2.0)
Drawn by: 365291
FIO2: 0.4 %
MECHVT: 500 mL
O2 Saturation: 86.7 %
RATE: 18 resp/min

## 2012-05-02 LAB — COMPREHENSIVE METABOLIC PANEL
ALT: 37 U/L (ref 0–53)
Alkaline Phosphatase: 72 U/L (ref 39–117)
BUN: 30 mg/dL — ABNORMAL HIGH (ref 6–23)
CO2: 39 mEq/L — ABNORMAL HIGH (ref 19–32)
GFR calc Af Amer: >90 mL/min (ref >90)
GFR calc non Af Amer: >90 mL/min (ref >90)
Glucose, Bld: 123 mg/dL — ABNORMAL HIGH (ref 70–99)
Potassium: 3.4 mEq/L — ABNORMAL LOW (ref 3.5–5.1)
Sodium: 141 mEq/L (ref 135–145)
Total Bilirubin: 0.8 mg/dL (ref 0.3–1.2)

## 2012-05-02 LAB — PHOSPHORUS: Phosphorus: 2.9 mg/dL (ref 2.3–4.6)

## 2012-05-02 LAB — CBC
HCT: 46.2 % (ref 39.0–52.0)
Hemoglobin: 14.7 g/dL (ref 13.0–17.0)
RBC: 4.96 MIL/uL (ref 4.22–5.81)

## 2012-05-02 LAB — GLUCOSE, CAPILLARY: Glucose-Capillary: 178 mg/dL — ABNORMAL HIGH (ref 70–99)

## 2012-05-02 MED ORDER — DILTIAZEM HCL 25 MG/5ML IV SOLN
10.0000 mg | Freq: Once | INTRAVENOUS | Status: AC
  Administered 2012-05-02: 10 mg via INTRAVENOUS
  Filled 2012-05-02: qty 5

## 2012-05-02 MED ORDER — DILTIAZEM 12 MG/ML ORAL SUSPENSION
60.0000 mg | Freq: Four times a day (QID) | ORAL | Status: DC
  Administered 2012-05-02 – 2012-05-06 (×17): 60 mg via ORAL
  Filled 2012-05-02 (×21): qty 6

## 2012-05-02 MED ORDER — METHYLPREDNISOLONE SODIUM SUCC 40 MG IJ SOLR
40.0000 mg | Freq: Two times a day (BID) | INTRAMUSCULAR | Status: DC
  Administered 2012-05-02 – 2012-05-03 (×2): 40 mg via INTRAVENOUS
  Filled 2012-05-02 (×4): qty 1

## 2012-05-02 MED ORDER — AMIODARONE LOAD VIA INFUSION
150.0000 mg | Freq: Once | INTRAVENOUS | Status: AC
  Administered 2012-05-02: 150 mg via INTRAVENOUS

## 2012-05-02 MED ORDER — AMIODARONE HCL IN DEXTROSE 360-4.14 MG/200ML-% IV SOLN
60.0000 mg/h | INTRAVENOUS | Status: DC
  Administered 2012-05-02: 60 mg/h via INTRAVENOUS
  Administered 2012-05-02: 30 mg/h via INTRAVENOUS
  Administered 2012-05-02: 200 mL via INTRAVENOUS
  Administered 2012-05-02: 60 mg/h via INTRAVENOUS
  Administered 2012-05-03 – 2012-05-05 (×5): 30 mg/h via INTRAVENOUS
  Filled 2012-05-02 (×19): qty 200

## 2012-05-02 MED ORDER — IOHEXOL 350 MG/ML SOLN
100.0000 mL | Freq: Once | INTRAVENOUS | Status: AC | PRN
  Administered 2012-05-02: 100 mL via INTRAVENOUS

## 2012-05-02 MED ORDER — AMIODARONE HCL IN DEXTROSE 360-4.14 MG/200ML-% IV SOLN
INTRAVENOUS | Status: AC
  Administered 2012-05-02: 200 mL via INTRAVENOUS
  Filled 2012-05-02: qty 200

## 2012-05-02 NOTE — Progress Notes (Signed)
Pt's heart rate has been ranging from 120-150's from 10am til 5pm, afib. Have given pt cardizem iv/ po without much change except for lower bp. Md called and added amio,with very little change. Pt has been intermittently restless/agitated, and sats have been in low 90's all day. Increased propofol drip from 20 mcg to 30 mcg at 1700 due to pt extremely agitated and heart rate increasing to 170's,and heart rate started trending down to 90's ,but bp also trended to 80's.decreased propofol to 25 mcg and now heart rate in 70's, bp 102.67, sats 96% and pt resting.

## 2012-05-02 NOTE — Progress Notes (Signed)
Spoke with Dr.Alva over sats, with increase secretions of thick, bloody, plug being suctioned from trache. Patients color is dark reddened/purplish like, requesting ABG's. Patient has blank stare and not f/c as reported as far as nodding head appropriate. This information passed on to MD as well.

## 2012-05-02 NOTE — Progress Notes (Addendum)
eLink Physician-Brief Progress Note Patient Name: Cody [name] DOB: 07-04-1950 MRN: [id number]  Date of Service  05/02/2012   HPI/Events of Note    CTA pos PE  eICU Interventions  Started heparin drip . Note was only on  SCDs which were removed, d/t fact had been bleeding from trach. Will have to watch for trach rebleed   Intervention Category Intermediate Interventions: Diagnostic test evaluation  Shan Levans 05/02/2012, 11:17 PM

## 2012-05-02 NOTE — Progress Notes (Signed)
PULMONARY  / CRITICAL CARE MEDICINE  Name: Cody [name] MRN: [id number] DOB: 1950-09-03    LOS: 20  REFERRING MD:   EDP - Dr. Preston Fleeting  CHIEF COMPLAINT:  Shortness of breath  BRIEF PATIENT DESCRIPTION: 62 yo M with COPD and 3ppd smoking admitted 04/12/2012 with acute respiratory failure 2/2 influenza A and aspiration PNA, required intubated 04/13/11 for airway protection and aspirated during intubation. Extubated 1/15 but reintubated emergently 1/18 for severe delerium & resp distress, purulent secretions noted. Trach 1/22.  LINES / TUBES: ETT 1/5 >>1/15 , 1/18 >> self extubated 1/21 >>> reintubated 1/21 L IJ 1/5 >> 1/13 R IJ 1/13 >> 1/16 RIJ 1/18 >>  R A-line 1/5 >>1/8 pulled out by pt Trach 1/22  CULTURES: Blood culture 1/5>> Negative Resp culture 1/6>> H. influenzae  Resp viral panel 1/6>> Influenza A and Metapneumovirus  Sputum Leigonella Culture 1/6>> Negative Bronch Sputum 1/6>> H. influenzae  Urine 1/5>> Neg  Urine 1/6>> Neg  Flu 1/5>> Neg Blood culture 01/13 >> NG Catheter tip culture 1/13 >>Staph -coag neg-70 colonies(vanc sens)  C.diff PCR 1/14 >> Negative Resp culture 1/18 >>> Negative  ANTIBIOTICS: Zosyn 1/5>>1/9 - then restarted 1/22 >>> Azithro 1/5>>1/8  Vanc 1/6>>>1/8 - then restarted 1/13 >>>1/25 Tamiflu 1/6>>>1/16  Rocephin 1/9>>1/15 Ceftazidine 1/18 >>1/22   SIGNIFICANT EVENTS:  1/5 Intubated in ED  1/5 Shock , levophed  1/6 ECHO - 55%, mod dil rv, PA pressures>>> not measured b/c no TR jet  1/6 Bronched- diffuse pus all lobes  1/7 increasing pCO2, increased RR on vent  1/8 Changed sedation to Precedex, more agitated than with versed  1/9 Changed sedation back to Versed, agitation greatly improved  1/10 scheduled haldol  1/12 Restarting precedex gtt due to persistent agitation 1/13 Febrile to 102.6 overnight. ? Catheter infection? DC'ed L IJ, placed new R IJ. 1/14 No fevers overnight, agitation controlled on propofol (added 1/13), more  interactive. 1/15 Had foul smelling stools overnight, therefore, C.diff PCR was sent by RN. 1/15 EXTUBATED, developed A.fib with RVR overnight. 1/16 Persistent episodes of desaturation., afib>started on cardizem drip  1/17 increased agitation  1/18 reintubated  1/20 Persistent A. Fib this morning on dilt drip.  Rates controlled between 99-125 1/21 Self extubated, given a chance and continued to be in respiratory distress so reintubated.  1/22 Tracheostomy, complicated by bleeding, angio edema from protamine 1/23 Continued agitation. 1/23- neg acute ct head 1/24 PEG placement planned.   LEVEL OF CARE:  ICU PRIMARY SERVICE:  PCCM CONSULTANTS:  None CODE STATUS: Full DIET:  TF DVT Px:  Heparin GI Px:  protonix   SUBJECTIVE / INTERVAL HISTORY:  Af-RVR this am No cp,  Weaned on ps 5/5    VITAL SIGNS: Temp:  [98.2 F (36.8 C)-99.3 F (37.4 C)] 98.7 F (37.1 C) (01/25 0400) Pulse Rate:  [79-133] 115  (01/25 0847) Resp:  [16-27] 17  (01/25 0800) BP: (98-178)/(69-111) 159/100 mmHg (01/25 0800) SpO2:  [88 %-96 %] 92 % (01/25 0949) FiO2 (%):  [40 %] 40 % (01/25 0949) Weight:  [95 kg (209 lb 7 oz)] 95 kg (209 lb 7 oz) (01/25 0458) HEMODYNAMICS: VENTILATOR SETTINGS: Vent Mode:  [-] PRVC FiO2 (%):  [40 %] 40 % Set Rate:  [18 bmp] 18 bmp Vt Set:  [500 mL] 500 mL PEEP:  [5 cmH20] 5 cmH20 Plateau Pressure:  [12 cmH20-14 cmH20] 14 cmH20 INTAKE / OUTPUT: Intake/Output      01/24 0701 - 01/25 0700 01/25 0701 - 01/26 0700  I.V. (mL/kg) 1031.5 (10.9) 35.7 (0.4)   NG/GT 490 20   IV Piggyback 1057.5 12.5   Total Intake(mL/kg) 2579 (27.1) 68.2 (0.7)   Urine (mL/kg/hr) 4300 (1.9)    Total Output 4300    Net -1721 +68.2         PHYSICAL EXAMINATION: General: Sedated and intubated   Head: Normocephalic, Trach in place, no bleeding.  Lungs:  Diminished BS, course rhonchi.    Heart: Irregularly irregular rhythm, regular rate.  Abdomen:  BS normoactive. Soft, Nondistended,  non-tender.  No masses or organomegaly.  Extremities: No pretibial edema.    IMAGING:  1/24 - KUB - Contrast noted filling the distal esophagus and stomach, progressing into the proximal small bowel. 1/25 improved aeration   DIAGNOSES: Principal Problem:  *Acute respiratory failure with hypercapnia Active Problems:  HYPERLIPIDEMIA  TOBACCO USE  HYPERTENSION  GERD  COPD (chronic obstructive pulmonary disease)  Acute encephalopathy  Acute kidney injury  Atrial fibrillation  ASSESSMENT / PLAN:  PULMONARY  Lab 04/30/12 0839 04/28/12 1702 04/27/12 0355  PHART 7.337* 7.352 7.382  PCO2ART 68.1* 66.9* 57.0*  PO2ART 84.0 78.0* 64.7*  HCO3 36.4* 37.0* 33.2*  TCO2 38 39 35.0   A: 1) Acute hypercapnic respiratory failure/ARDS - secondary to flu, COPD exacerbation. Trach 1/22. 2) Aspiration - at time of intubation, finished course of treatment , restarted Vanc 1/13 fever. 3) Pneumonitis vs. CAP - b/l interstitial opacities on cxr, slightly increased at left, likely atelectasis. Completed 8/8 days rocephin , on tapering steroids  4) Pulmonary edema - 4) Non-oxygen dependent COPD - h/o chronic tobacco abuse hx 3 pack per day 20+ years. On symbicort at home. Not on home O2 therapy. 5) Bloody secretions from aspiration - Pulm /renal syndrome ruled out. 6) upper airway severe angio edema from protamine  7)likley small hematoma on rt apex from neck oozing  P:  SBts once tachy resolved - Duonebs q6h + q2h PRN. - On Solumedrol post-trach for angio edema, reduce to 40 q12h - po in 24 h - Smoking cessation   CARDIOVASCULAR No results found for this basename: CKTOTAL:3,CKMB:3,TROPONINI:3 in the last 168 hours  Lab 04/27/12 0344  PROBNP 241.4*   A:  1) New onset atrial fibrillation with RVR - on cardizem PO and Hep prior to trach 1/22. Converted to NSR  2) Elevated proBNP - ProBNP on admission 17911 --> 398 on 1/12. Trop x3 negative. ECHO with normal systolic and diastolic  dysfunction, EF 50-55%.  3) HTN -   home regimen Lisinopril 40mg , Norvasc 5mg , and HCTZ 25mg  qd. -on hold  4) Hypotension - Resolved. 5) Anaphylaxis to protamine - protamine administered to reverse heparin in setting of trach, anaphylactic reaction. Solumedrol, pepcid, benadryl given with stabilization. Protamine added to allergy list.  PLAN:  - Goal HR < 120, cardizem 10mg  IV & resume PO - Continue lasix.  - WILL HOLD HEPARIN x 1 week in setting life threatening bleeding and for indication fib   RENAL  Lab 05/02/12 0425 05/01/12 0341 04/30/12 0500  NA 141 138 136  K 3.4* 4.2 4.5  CL 98 96 96  CO2 39* 38* 35*  BUN 30* 28* 28*  CREATININE 0.55 0.57 0.60  GLUCOSE 123* 147* 209*  MG 2.3 2.2 2.1  PHOS 2.9 2.7 3.1    Intake/Output Summary (Last 24 hours) at 05/02/12 0950 Last data filed at 05/02/12 0800  Gross per 24 hour  Intake 2553.04 ml  Output   4125 ml  Net -1571.96 ml  A:  1) Acute Kidney injury - Resolved. Previously likely pre-renal in setting of volume depletion. Previously urinary sediment clogging foley, improved. Continue to monitor in setting of lasix use. 2) Hypernatremia - resolved  3) Hypokalemia - in setting of lasix. Resolved.  P:  -  goal to keep even. - Lasix at current 40mg  IV qday  GASTROINTESTINAL  Lab 05/02/12 0425 04/30/12 0500  AST 27 17  ALT 37 23  ALKPHOS 72 61  BILITOT 0.8 0.7  PROT 6.0 5.6*  ALBUMIN 2.3* 2.0*   A:  1) GERD 2) Diarrhea - resolving. C. Diff negative.  P: - Start TF  - PPI on vent - PEG monday,  delayed with lack of contrast  HEMATOLOGIC  Lab 05/02/12 0425 05/01/12 0341 04/30/12 0500 04/29/12 1328 04/28/12 0157  WBC 13.3* 15.6* 12.5* -- --  HGB 14.7 14.0 14.9 -- --  HCT 46.2 44.7 46.6 -- --  PLT 193 162 143* -- --  APTT -- -- -- -- --  INR -- -- -- 0.96 1.04    A:  1) Polycythemia- Hgb stable.  2) Leukocytosis -  Increased this AM, likely in setting of high dose steroids and hemoconcetration - no fever.  Prior DC of left IJ on 1/13 (cath tip infection-staph/coag neg). 3) Anticoagulation in setting of atrial fibrillation - CHADS score of 3. Heparin and coumadin held prior to trach 1/22 and coags wnl.  P:  - Consider resume heparin in 1 week ( had additional vessels also that rebleed) - No coumadin for now. - SCD  INFECTIOUS  Lab 04/29/12 0446 04/28/12 0455 04/27/12 1230  LATICACIDVEN -- -- --  PROCALCITON 0.17 0.15 0.14   1) Flu pneumonia - Flu A and metapneumovirus >finished full 10-day course of Tamiflu, droplet isolation d/c 1/18 2) Aspirated at time of intubation, pneumonitis vs PNA>finished flu course of Rocephin. Aspirated again during reintubation.  3) Nosocomial infection -  Left IJ (in 8 days) was dc'ed on 1/13 and catheter tip cultures>staph/coag neg . Was resumed Vanc on 1/13 -->  4) Diarrhea - C.diff neg. 5) Leukocytosis - likely 2/2 high dose steroids. Remains afebrile, Ceftaz added 1/18 w/ sputum cx negative. Procalcitonin 0.14 --> 0.15.   P:  - Continue Zosyn x 7 for aspiration PNA., dc vanc - Continue to monitor for s/s of additional infection.  ENDOCRINE  Lab 05/02/12 0803 05/02/12 0405 05/02/12 0019 05/01/12 2004 05/01/12 1608  GLUCAP 178* 122* 171* 167* 146*   Lab Results  Component Value Date   HGBA1C 7.1* 04/24/2012   TSH 1.494 04/25/2012    A:  1) Diabetes mellitus, type 2 (A1c 7.1) - possible iatrogenic secondary to prolonged steroid use. 2) On steroids - CBGs at goal. Lantus off. P:  - Cont ICU Hyperglycemic protocol - escalate scale. - CBG monitoring q4h while NPO. - Continue steroids.  NEUROLOGIC / PSYCHIATRIC   A:  1) Acute encephalopathy - Resolved. Likely secondary to hypoxia / hypercarbia and possible infectious process (CAP?). >returned 1/18 >requiring intubation  2) Delerium - persistent, controlled on propofol and fentanyl, requiring high doses, inhibiting weaning.   P:  - Continue propofol + fentanyl, wean as able. Goal to dc  propofol -add methadone 5 mg q6h to reduce fentanyl - Delirium, avoid benzos - Resume seroquel. - PRN haldol  - Monitor QTc. - Does not qualify for LTAC, SNF/trach beds requested (possibly out of state) - PT/ OT consult. -ativan 1 q12h  CLINICAL SUMMARY: 62 yo M with COPD and 3ppd  smoking admitted 04/12/2012 with acute respiratory failure 2/2 influenza A and aspiration PNA, required intubated 04/13/11 for airway protection and aspirated during intubation. Extubated 1/15 but reintubated emergently 1/18 for severe delerium & resp distress, purulent secretions noted. Trach 1/22, still requiring heavy sedation. 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 Plan PEG 1/24? Marland Kitchen Add oral meds to dc sedation. Stay in ICU with risk rebleed.  Family updated   Care during the described time interval was provided by me and/or other providers on the critical care team.  I have reviewed this patient's available data, including medical history, events of note, physical examination and test results as part of my evaluation  CC time x 32 m  Cyril Mourning MD. Tonny Bollman. [facility] Pager [phone] If no response call [phone]    05/02/2012, 9:50 AM

## 2012-05-02 NOTE — Progress Notes (Signed)
ANTICOAGULATION CONSULT NOTE - Initial Consult  Pharmacy Consult for heparin Indication: pulmonary embolus  Allergies  Allergen Reactions  . Protamine Anaphylaxis    Patient Measurements: Height: 5\' 10"  (177.8 cm) Weight: 209 lb 7 oz (95 kg) IBW/kg (Calculated) : 73  Heparin Dosing Weight: 92 kg  Vital Signs: Temp: 98.9 F (37.2 C) (01/25 1900) Temp src: Oral (01/25 1900) BP: 113/76 mmHg (01/25 2330) Pulse Rate: 94  (01/25 2330)  Labs:  Basename 05/02/12 0425 05/01/12 0341 04/30/12 0500  HGB 14.7 14.0 --  HCT 46.2 44.7 46.6  PLT 193 162 143*  APTT -- -- --  LABPROT -- -- --  INR -- -- --  HEPARINUNFRC -- -- <0.10*  CREATININE 0.55 0.57 0.60  CKTOTAL -- -- --  CKMB -- -- --  TROPONINI -- -- --    Estimated Creatinine Clearance: 109.4 ml/min (by C-G formula based on Cr of 0.55).   Medical History: Past Medical History  Diagnosis Date  . Stroke   . Hypertension   . Hyperlipidemia   . GERD (gastroesophageal reflux disease)   . COPD (chronic obstructive pulmonary disease)     Medications:  Scheduled:    . [COMPLETED] amiodarone  150 mg Intravenous Once  . antiseptic oral rinse  15 mL Mouth Rinse QID  . chlorhexidine  15 mL Mouth Rinse BID  . diltiazem  60 mg Oral Q6H  . [COMPLETED] diltiazem  10 mg Intravenous Once  . famotidine (PEPCID) IV  20 mg Intravenous Q12H  . feeding supplement  60 mL Oral TID  . furosemide  40 mg Intravenous Daily  . insulin aspart  0-15 Units Subcutaneous Q4H  . ipratropium  0.5 mg Nebulization Q6H  . levalbuterol  0.63 mg Nebulization Q6H  . LORazepam  1 mg Oral BID  . methadone  5 mg Oral Q6H  . methylPREDNISolone (SOLU-MEDROL) injection  40 mg Intravenous Q12H  . multivitamin  5 mL Per Tube Daily  . piperacillin-tazobactam (ZOSYN)  IV  3.375 g Intravenous Q8H  . QUEtiapine  50 mg Oral BID  . sodium chloride  500 mL Intravenous Once  . [DISCONTINUED] fentaNYL  200 mcg Intravenous Once  . [DISCONTINUED]  methylPREDNISolone (SOLU-MEDROL) injection  60 mg Intravenous Q12H  . [DISCONTINUED] vancomycin  1,000 mg Intravenous Q8H    Assessment: 62 yo male known to pharmacy from previous heparin dosing for atrial fibrillation. Patient was on heparin infusion until tracheostomy on 1/22. Due to associated bleeding from tracheostomy, original plan to hold heparin for at least 7 days.   Patient now with pulmonary embolus per chest CT. Confirmed with RN that no bleeding currently.   Previous heparin rate (before it was turned off pre-trach) was 2700 units/hr with heparin level of 0.7.   Goal of Therapy:  Heparin level 0.3-0.7 units/ml Monitor platelets by anticoagulation protocol: Yes   Plan:  1. Heparin IV bolus 4500 units, then start IV infusion at 2300 units/hr.  2. Heparin level in 6 hours.  3. Daily CBC, heparin level.  Emeline Gins 05/02/2012,11:45 PM

## 2012-05-02 NOTE — Evaluation (Signed)
Physical Therapy Evaluation Patient Details Name: Cody [name] MRN: [id number] DOB: 1950/08/20 Today's Date: 05/02/2012 Time: 1478-2956 PT Time Calculation (min): 19 min  PT Assessment / Plan / Recommendation Clinical Impression  Pt admitted with SOB and respiratory failure due to flu and PNA with VDRF1/5-1/15, 1/18-1/21 with trach and bronchoscopy 1/22. Pt with signiticant change from initial eval remains on vent at Fio2 40% with PRVC and sats only 90-92% on vent support. Pt with limited ability to follow commands today and total assist for pericare during eval. Pt unable to mouth words and did not nod responses other than comfort after pericare. Pt will benefit from acute therapy to maximize strength, function, mobility and activity tolerance to decrease burden of care.     PT Assessment  Patient needs continued PT services    Follow Up Recommendations  LTACH    Does the patient have the potential to tolerate intense rehabilitation      Barriers to Discharge Decreased caregiver support      Equipment Recommendations  Other (comment) (to be determined based on progression)    Recommendations for Other Services OT consult   Frequency Min 3X/week    Precautions / Restrictions Precautions Precautions: Fall Precaution Comments: trach, OG tube, bil mittens, vent Restrictions Weight Bearing Restrictions: No   Pertinent Vitals/Pain Vitals stable no significant pain demonstrated      Mobility  Bed Mobility Bed Mobility: Rolling Right;Rolling Left;Scooting to HOB Rolling Right: 1: +2 Total assist Rolling Right: Patient Percentage: 10% Rolling Left: 1: +1 Total assist Scooting to HOB: 1: +2 Total assist Scooting to [facility]: Patient Percentage: 0% Details for Bed Mobility Assistance: pt reaching for rail and able to grasp rail with RUE to roll left with assist to bend knee and initiate rotation of lower body. Pt not actively assisting with rolling right and use of pad to scoot to  [facility] Transfers Transfers: Not assessed Ambulation/Gait Ambulation/Gait Assistance: Not tested (comment)    Shoulder Instructions     Exercises     PT Diagnosis: Altered mental status;Generalized weakness  PT Problem List: Decreased strength;Decreased activity tolerance;Decreased mobility;Decreased cognition;Decreased knowledge of use of DME PT Treatment Interventions: Functional mobility training;Therapeutic exercise;Therapeutic activities;Balance training;Cognitive remediation;Patient/family education;Neuromuscular re-education   PT Goals Acute Rehab PT Goals PT Goal Formulation: Patient unable to participate in goal setting Time For Goal Achievement: 05/16/12 Potential to Achieve Goals: Fair Pt will Roll Supine to Right Side: with mod assist;with rail PT Goal: Rolling Supine to Right Side - Progress: Goal set today Pt will Roll Supine to Left Side: with mod assist;with rail PT Goal: Rolling Supine to Left Side - Progress: Goal set today Pt will go Supine/Side to Sit: with +2 total assist;with HOB not 0 degrees (comment degree) (pt=50%) PT Goal: Supine/Side to Sit - Progress: Goal set today Pt will Sit at [facility] of Bed: with mod assist;1-2 min;with bilateral upper extremity support PT Goal: Sit at Edge Of Bed - Progress: Goal set today Pt will go Sit to Supine/Side: with +2 total assist;with HOB 0 degrees;Other (comment) (pt=50%) PT Goal: Sit to Supine/Side - Progress: Goal set today PT Goal: Sit to Stand - Progress: Discontinued (comment) (not appropriate at this time) PT Goal: Stand to Sit - Progress: Discontinued (comment) (not appropriate at this time) Pt will Ambulate:  (not appropriate at this time) PT Goal: Ambulate - Progress: Discontinued (comment) (not appropriate at this time) PT Goal: Up/Down Stairs - Progress: Discontinued (comment) (not appropriate at this time)  Visit Information  Last  PT Received On: 05/02/12 Assistance Needed: +2    Subjective Data  Subjective:  pt nonverbal due to trach and only nodding 1x during eval Patient Stated Goal: unable to state   Prior Functioning  Home Living Lives With: Spouse Available Help at Discharge: Family;Available 24 hours/day Type of Home: Mobile home Home Access: Stairs to enter Entrance Stairs-Number of Steps: 3 Entrance Stairs-Rails: Right;Left Home Layout: One level Bathroom Shower/Tub: Health visitor: Standard Bathroom Accessibility: Yes How Accessible: Accessible via walker Home Adaptive Equipment: Built-in shower seat Prior Function Level of Independence: Independent Able to Take Stairs?: Yes Driving: Yes Vocation: Full time employment Communication Communication: Tracheostomy    Cognition  Overall Cognitive Status: Difficult to assess Area of Impairment: Attention;Following commands Difficult to assess due to: Tracheostomy Arousal/Alertness: Lethargic Behavior During Session: Flat affect Current Attention Level: Focused Following Commands: Follows one step commands inconsistently;Follows one step commands with increased time    Extremity/Trunk Assessment Right Upper Extremity Assessment RUE ROM/Strength/Tone: Deficits RUE ROM/Strength/Tone Deficits: pt able to grip hand, AAROM for shoulder flexion, other ROM WFL as tested with activity and pt reaching across midline with RUE Left Upper Extremity Assessment LUE ROM/Strength/Tone: Deficits LUE ROM/Strength/Tone Deficits: pt able to grip with LUE did not demonstrate AROM or AAROM of shoulder or elbow flexion with cueing and assist Right Lower Extremity Assessment RLE ROM/Strength/Tone: Deficits RLE ROM/Strength/Tone Deficits: ROM WFL, pt not exhibiting AROM of bil LE but performed PROM supine marching x 5 bil LE Left Lower Extremity Assessment LLE ROM/Strength/Tone: Deficits LLE ROM/Strength/Tone Deficits: ROM WFL, pt not exhibiting AROM of bil LE but performed PROM supine marching x 5 bil LE   Balance    End of  Session PT - End of Session Activity Tolerance: Patient tolerated treatment well Patient left: in bed;with call bell/phone within reach;with nursing in room Nurse Communication: Mobility status  GP     Toney Sang [facility] 05/02/2012, 8:59 AM  Delaney Meigs, PT [phone]

## 2012-05-03 ENCOUNTER — Inpatient Hospital Stay (HOSPITAL_COMMUNITY): Payer: Medicaid Other

## 2012-05-03 DIAGNOSIS — I2699 Other pulmonary embolism without acute cor pulmonale: Secondary | ICD-10-CM | POA: Diagnosis not present

## 2012-05-03 LAB — BASIC METABOLIC PANEL
BUN: 28 mg/dL — ABNORMAL HIGH (ref 6–23)
CO2: 38 mEq/L — ABNORMAL HIGH (ref 19–32)
Calcium: 8.7 mg/dL (ref 8.4–10.5)
GFR calc non Af Amer: >90 mL/min (ref >90)
Glucose, Bld: 129 mg/dL — ABNORMAL HIGH (ref 70–99)

## 2012-05-03 LAB — CBC
HCT: 48.1 % (ref 39.0–52.0)
MCH: 30.5 pg (ref 26.0–34.0)
MCHC: 32.6 g/dL (ref 30.0–36.0)
MCV: 93.4 fL (ref 78.0–100.0)
RDW: 16.6 % — ABNORMAL HIGH (ref 11.5–15.5)

## 2012-05-03 LAB — GLUCOSE, CAPILLARY: Glucose-Capillary: 126 mg/dL — ABNORMAL HIGH (ref 70–99)

## 2012-05-03 LAB — HEPARIN LEVEL (UNFRACTIONATED): Heparin Unfractionated: 0.32 IU/mL (ref 0.30–0.70)

## 2012-05-03 MED ORDER — HEPARIN SOD (PORCINE) IN D5W 100 UNIT/ML IV SOLN
INTRAVENOUS | Status: AC
  Filled 2012-05-03: qty 250

## 2012-05-03 MED ORDER — METOCLOPRAMIDE HCL 5 MG/ML IJ SOLN
10.0000 mg | Freq: Three times a day (TID) | INTRAMUSCULAR | Status: AC
  Administered 2012-05-03 – 2012-05-05 (×6): 10 mg via INTRAVENOUS
  Filled 2012-05-03 (×6): qty 2

## 2012-05-03 MED ORDER — SODIUM CHLORIDE 0.9 % IV SOLN
INTRAVENOUS | Status: DC
  Administered 2012-05-03 – 2012-05-12 (×6): via INTRAVENOUS
  Administered 2012-05-15: 20 mL/h via INTRAVENOUS

## 2012-05-03 MED ORDER — QUETIAPINE FUMARATE 100 MG PO TABS
100.0000 mg | ORAL_TABLET | Freq: Two times a day (BID) | ORAL | Status: DC
  Administered 2012-05-03 – 2012-05-05 (×5): 100 mg via ORAL
  Filled 2012-05-03 (×7): qty 1

## 2012-05-03 MED ORDER — HEPARIN (PORCINE) IN NACL 100-0.45 UNIT/ML-% IJ SOLN
2450.0000 [IU]/h | INTRAMUSCULAR | Status: DC
  Administered 2012-05-03 (×2): 2450 [IU]/h via INTRAVENOUS
  Administered 2012-05-03: 2300 [IU]/h via INTRAVENOUS
  Administered 2012-05-03 – 2012-05-04 (×2): 2450 [IU]/h via INTRAVENOUS
  Filled 2012-05-03 (×5): qty 250

## 2012-05-03 MED ORDER — SODIUM CHLORIDE 0.9 % IV SOLN
INTRAVENOUS | Status: DC
  Administered 2012-05-03 – 2012-05-05 (×4): via INTRAVENOUS

## 2012-05-03 MED ORDER — POTASSIUM CHLORIDE 10 MEQ/50ML IV SOLN
10.0000 meq | INTRAVENOUS | Status: AC
  Administered 2012-05-03 (×3): 10 meq via INTRAVENOUS
  Filled 2012-05-03 (×2): qty 50

## 2012-05-03 MED ORDER — HEPARIN BOLUS VIA INFUSION
4500.0000 [IU] | Freq: Once | INTRAVENOUS | Status: AC
  Administered 2012-05-03: 4500 [IU] via INTRAVENOUS
  Filled 2012-05-03: qty 4500

## 2012-05-03 MED ORDER — FUROSEMIDE 40 MG PO TABS
40.0000 mg | ORAL_TABLET | Freq: Every day | ORAL | Status: DC
  Administered 2012-05-04 – 2012-05-05 (×2): 40 mg
  Filled 2012-05-03 (×4): qty 1

## 2012-05-03 MED ORDER — POTASSIUM CHLORIDE 10 MEQ/100ML IV SOLN
INTRAVENOUS | Status: AC
  Administered 2012-05-03: 10 meq
  Filled 2012-05-03: qty 100

## 2012-05-03 MED ORDER — PREDNISONE 20 MG PO TABS
40.0000 mg | ORAL_TABLET | Freq: Every day | ORAL | Status: DC
  Administered 2012-05-04 – 2012-05-05 (×2): 40 mg
  Filled 2012-05-03 (×3): qty 2

## 2012-05-03 NOTE — Progress Notes (Signed)
eLink Physician-Brief Progress Note Patient Name: [name] DOB: [birth year] MRN: [id number]  Date of Service  05/03/2012   HPI/Events of Note   K < 3.0  eICU Interventions  K supp IV    Intervention Category Major Interventions: Electrolyte abnormality - evaluation and management  Shan Levans 05/03/2012, 6:30 AM

## 2012-05-03 NOTE — Progress Notes (Signed)
*  Preliminary Results* Bilateral lower extremity venous duplex completed. The right lower extremity is positive for acute deep vein thrombosis involving the right saphenofemoral junction and common femoral vein.  The left lower extremity is positive for acute deep vein thrombosis involving the left saphenofemoral junction, with mobile thrombus visualized in the left common femoral vein. Did not perform compression maneuvers to determine DVT in left femoral vein, however by 2D visualization the femoral vein does appear to have deep vein thrombosis. Unable to evaluate left popliteal vein due to patient positioning. There is no evidence of right Baker's cyst.  Preliminary results discussed with Marylene Land, RN.  05/03/2012 3:24 PM Gertie Fey, RDMS, RDCS

## 2012-05-03 NOTE — Progress Notes (Signed)
eLink Physician-Brief Progress Note Patient Name: Cody [name] DOB: [birth year] MRN: [id number]  Date of Service  05/03/2012   HPI/Events of Note   Mobile thrombus reported in L common femoral vein.  Preliminary report is in Epic.  Unable to reach reading physician for final report.  Discussed with Dr. Vassie Loll.   eICU Interventions   Already on anticoagulation for PE.  Will place request for IR IVC filter placement.    Intervention Category Minor Interventions: Communication with other healthcare providers and/or family  Lonia Farber 05/03/2012, 5:41 PM

## 2012-05-03 NOTE — Progress Notes (Signed)
PULMONARY  / CRITICAL CARE MEDICINE  Name: Cody [name] MRN: [id number] DOB: 1950/07/24    LOS: 21  REFERRING MD:   EDP - Dr. Preston Fleeting  CHIEF COMPLAINT:  Shortness of breath  BRIEF PATIENT DESCRIPTION: 62 yo M with COPD and 3ppd smoking admitted 04/12/2012 with acute respiratory failure 2/2 influenza A and aspiration PNA, required intubated 04/13/11 for airway protection and aspirated during intubation. Extubated 1/15 but reintubated emergently 1/18 for severe delerium & resp distress, purulent secretions noted. Trach 1/22.  LINES / TUBES: ETT 1/5 >>1/15 , 1/18 >> self extubated 1/21 >>> reintubated 1/21 L IJ 1/5 >> 1/13 R IJ 1/13 >> 1/16 RIJ 1/18 >>  R A-line 1/5 >>1/8 pulled out by pt Trach 1/22  CULTURES: Blood culture 1/5>> Negative Resp culture 1/6>> H. influenzae  Resp viral panel 1/6>> Influenza A and Metapneumovirus  Sputum Leigonella Culture 1/6>> Negative Bronch Sputum 1/6>> H. influenzae  Urine 1/5>> Neg  Urine 1/6>> Neg  Flu 1/5>> Neg Blood culture 01/13 >> NG Catheter tip culture 1/13 >>Staph -coag neg-70 colonies(vanc sens)  C.diff PCR 1/14 >> Negative Resp culture 1/18 >>> Negative  ANTIBIOTICS: Zosyn 1/5>>1/9 - then restarted 1/22 >>> Azithro 1/5>>1/8  Vanc 1/6>>>1/8 - then restarted 1/13 >>>1/25 Tamiflu 1/6>>>1/16  Rocephin 1/9>>1/15 Ceftazidine 1/18 >>1/22   SIGNIFICANT EVENTS:  1/5 Intubated in ED  1/5 Shock , levophed  1/6 ECHO - 55%, mod dil rv, PA pressures>>> not measured b/c no TR jet  1/6 Bronched- diffuse pus all lobes  1/7 increasing pCO2, increased RR on vent  1/8 Changed sedation to Precedex, more agitated than with versed  1/9 Changed sedation back to Versed, agitation greatly improved  1/10 scheduled haldol  1/12 Restarting precedex gtt due to persistent agitation 1/13 Febrile to 102.6 overnight. ? Catheter infection? DC'ed L IJ, placed new R IJ. 1/14 No fevers overnight, agitation controlled on propofol (added 1/13), more  interactive. 1/15 Had foul smelling stools overnight, therefore, C.diff PCR was sent by RN. 1/15 EXTUBATED, developed A.fib with RVR overnight. 1/16 Persistent episodes of desaturation., afib>started on cardizem drip  1/17 increased agitation  1/18 reintubated  1/20 Persistent A. Fib this morning on dilt drip.  Rates controlled between 99-125 1/21 Self extubated, given a chance and continued to be in respiratory distress so reintubated.  1/22 Tracheostomy, complicated by bleeding, angio edema from protamine 1/23 Continued agitation. 1/23- neg acute ct head 1/24 PEG placement planned. 1/25 - CTA showing PE BL --> started on heparin gtt.   LEVEL OF CARE:  ICU PRIMARY SERVICE:  PCCM CONSULTANTS:  None CODE STATUS: Full DIET:  TF DVT Px:  Heparin gtt GI Px:  protonix   SUBJECTIVE / INTERVAL HISTORY:  Rates better controlled after starting amiodarone + cardizem Agitation better controlled with seroquel and methadone    VITAL SIGNS: Temp:  [98 F (36.7 C)-98.9 F (37.2 C)] 98.6 F (37 C) (01/26 0800) Pulse Rate:  [77-145] 106  (01/26 0900) Resp:  [17-24] 17  (01/26 0900) BP: (80-182)/(59-118) 152/84 mmHg (01/26 0900) SpO2:  [88 %-97 %] 92 % (01/26 0900) FiO2 (%):  [40 %-50 %] 50 % (01/26 0831) Weight:  [205 lb 11 oz (93.3 kg)] 205 lb 11 oz (93.3 kg) (01/26 0400) HEMODYNAMICS: VENTILATOR SETTINGS: Vent Mode:  [-] PRVC FiO2 (%):  [40 %-50 %] 50 % Set Rate:  [18 bmp] 18 bmp Vt Set:  [500 mL] 500 mL PEEP:  [5 cmH20] 5 cmH20 Pressure Support:  [5 cmH20] 5 cmH20 Plateau Pressure:  [  13 cmH20-17 cmH20] 16 cmH20 INTAKE / OUTPUT: Intake/Output      01/25 0701 - 01/26 0700 01/26 0701 - 01/27 0700   I.V. (mL/kg) 2309.4 (24.8) 89.8 (1)   NG/GT 1080 20   IV Piggyback 387.5 62.5   Total Intake(mL/kg) 3776.9 (40.5) 172.3 (1.8)   Urine (mL/kg/hr) 2810 (1.3)    Total Output 2810    Net +966.9 +172.3         PHYSICAL EXAMINATION: General: Sedated and intubated   Head:  Normocephalic, Trach in place, no bleeding.  Lungs:  Diminished BS, course rhonchi.    Heart: Irregularly irregular rhythm, regular rate.  Abdomen:  BS normoactive. Soft, Nondistended, non-tender.  No masses or organomegaly.  Extremities: No pretibial edema.    IMAGING:  1/24 - KUB - Contrast noted filling the distal esophagus and stomach, progressing into the proximal small bowel. 1/25 - pCXR - Improved aeration 1/25 - CTA chest - 1. Pulmonary embolus within segmental branches to the right upper lobe, left upper lobe and left lower lobe, extending distally. 2. Bilateral lower lobe airspace opacification is thought to reflect atelectasis, with trace bilateral pleural effusions. No definite evidence for aspiration. 3. Mild nonspecific left upper lobe opacities may reflect atelectasis. 1/26 - Abd XR - Interval development of a moderate colonic ileus since the examination 2 days ago. No evidence of bowel obstruction.   DIAGNOSES: Principal Problem:  *Acute respiratory failure with hypercapnia Active Problems:  HYPERLIPIDEMIA  TOBACCO USE  HYPERTENSION  GERD  COPD (chronic obstructive pulmonary disease)  Acute encephalopathy  Acute kidney injury  Atrial fibrillation  ASSESSMENT / PLAN:  PULMONARY  Lab 05/02/12 2115 04/30/12 0839 04/28/12 1702  PHART 7.475* 7.337* 7.352  PCO2ART 53.7* 68.1* 66.9*  PO2ART 52.3* 84.0 78.0*  HCO3 39.1* 36.4* 37.0*  TCO2 40.7 38 39   A: 1) Acute hypercapnic respiratory failure/ARDS - secondary to flu, COPD exacerbation. Trach 1/22. 2) Aspiration - at time of intubation, finished course of treatment , restarted Vanc 1/13 fever. 3) Pneumonitis vs. CAP - b/l interstitial opacities on cxr, slightly increased at left, likely atelectasis. Completed 8/8 days rocephin , on tapering steroids  4) Pulmonary edema - improved. 4) Non-oxygen dependent COPD - h/o chronic tobacco abuse hx 3 pack per day 20+ years. On symbicort at home. Not on home O2 therapy. 5)  Bloody secretions from aspiration - Pulm /renal syndrome ruled out. 6) upper airway severe angio edema from protamine  7)likley small hematoma on rt apex from neck oozing  P:  - SBTs now that tachy resolved - Duonebs q6h + q2h PRN. - PO prednisone 40 mg  post-trach for angio edema - Smoking cessation   CARDIOVASCULAR No results found for this basename: CKTOTAL:3,CKMB:3,TROPONINI:3 in the last 168 hours  Lab 04/27/12 0344  PROBNP 241.4*   A:  1) New onset atrial fibrillation with RVR - on cardizem PO and Hep prior to trach 1/22. Converted to NSR  2) Elevated proBNP - ProBNP on admission 17911 --> 398 on 1/12. Trop x3 negative. ECHO with normal systolic and diastolic dysfunction, EF 50-55%.  3) HTN -   home regimen Lisinopril 40mg , Norvasc 5mg , and HCTZ 25mg  qd. -on hold  4) Hypotension - Resolved. 5) Anaphylaxis to protamine - protamine administered to reverse heparin in setting of trach, anaphylactic reaction. Solumedrol, pepcid, benadryl given with stabilization. Protamine added to allergy list.  PLAN:  - Goal HR < 120, cardizem  PO & amio gtt - Continue lasix.   RENAL  Lab  05/03/12 0535 05/02/12 0425 05/01/12 0341 04/30/12 0500  NA 140 141 138 --  K 2.6* 3.4* 4.2 --  CL 97 98 96 --  CO2 38* 39* 38* --  BUN 28* 30* 28* --  CREATININE 0.59 0.55 0.57 --  GLUCOSE 129* 123* 147* --  MG -- 2.3 2.2 2.1  PHOS -- 2.9 2.7 3.1    Intake/Output Summary (Last 24 hours) at 05/03/12 1026 Last data filed at 05/03/12 0806  Gross per 24 hour  Intake 3269.63 ml  Output   2410 ml  Net 859.63 ml    A:  1) Acute Kidney injury - Resolved. Previously likely pre-renal in setting of volume depletion. Previously urinary sediment clogging foley, improved. Continue to monitor in setting of lasix use. 2) Hypernatremia - resolved  3) Hypokalemia - in setting of lasix. Resolved.  P:  -  goal to keep even. - Lasix at current 40mg  IV qday  GASTROINTESTINAL  Lab 05/02/12 0425 04/30/12  0500  AST 27 17  ALT 37 23  ALKPHOS 72 61  BILITOT 0.8 0.7  PROT 6.0 5.6*  ALBUMIN 2.3* 2.0*   A:  1) GERD 2) Diarrhea - resolving. C. Diff negative. 3) Colonic Ileus 4) paraesophageal nodes - need FU CT in 3-4 mnths P: - ct TF  - Pepcid - PEG monday,  delayed with lack of contrast, note colonic ileus -minimise narcotics, reglan 10 IV q 8h x 6 doses  HEMATOLOGIC  Lab 05/03/12 0530 05/02/12 0425 05/01/12 0341 04/29/12 1328 04/28/12 0157  WBC 13.0* 13.3* 15.6* -- --  HGB 15.7 14.7 14.0 -- --  HCT 48.1 46.2 44.7 -- --  PLT 216 193 162 -- --  APTT -- -- -- -- --  INR -- -- -- 0.96 1.04    A:  1) Polycythemia- Hgb stable.  2) Leukocytosis -  Increased this AM, likely in setting of high dose steroids and hemoconcetration - no fever. Prior DC of left IJ on 1/13 (cath tip infection-staph/coag neg). 3) Anticoagulation in setting of atrial fibrillation - CHADS score of 3. Heparin and coumadin held prior to trach 1/22 and coags wnl. 4)PE  P:  - IV heparin -watch for trach bleed  -duplex BLEs - if bleeds on heparin then needs filter - No coumadin for now. - SCD  INFECTIOUS  Lab 04/29/12 0446 04/28/12 0455 04/27/12 1230  LATICACIDVEN -- -- --  PROCALCITON 0.17 0.15 0.14   1) Flu pneumonia - Flu A and metapneumovirus >finished full 10-day course of Tamiflu, droplet isolation d/c 1/18 2) Aspirated at time of intubation, pneumonitis vs PNA>finished flu course of Rocephin. Aspirated again during reintubation.  3) Nosocomial infection -  Left IJ (in 8 days) was dc'ed on 1/13 and catheter tip cultures>staph/coag neg . Was resumed Vanc on 1/13 -->  4) Diarrhea - C.diff neg. 5) Leukocytosis - likely 2/2 high dose steroids. Remains afebrile, Ceftaz added 1/18 w/ sputum cx negative. Procalcitonin 0.14 --> 0.15.   P:  - Continue Zosyn x 7 for aspiration PNA., dc vanc - Continue to monitor for s/s of additional infection.  ENDOCRINE  Lab 05/03/12 0800 05/03/12 0347 05/02/12 2332  05/02/12 1936 05/02/12 1623  GLUCAP 171* 154* 141* 119* 154*   Lab Results  Component Value Date   HGBA1C 7.1* 04/24/2012   TSH 1.494 04/25/2012    A:  1) Diabetes mellitus, type 2 (A1c 7.1) - possible iatrogenic secondary to prolonged steroid use. 2) On steroids - CBGs at goal. Lantus off. P:  -  Cont ICU Hyperglycemic protocol - escalate scale. - CBG monitoring q4h while NPO. - Continue steroids.  NEUROLOGIC / PSYCHIATRIC   A:  1) Acute encephalopathy - Resolved. Likely secondary to hypoxia / hypercarbia and possible infectious process (CAP?). >returned 1/18 >requiring intubation  2) Delerium - persistent, controlled on propofol and fentanyl, requiring high doses, inhibiting weaning.   P:  - Continue propofol + fentanyl, wean as able. Goal to dc propofol - Continue methadone and seroquel -titrate - Wean fentanyl - Delirium, avoid benzos - Does not qualify for LTAC, SNF/trach beds requested (possibly out of state) - PT/ OT consult. - Ativan 1 q12h  CLINICAL SUMMARY: 62 yo M with COPD and 3ppd smoking admitted 04/12/2012 with acute respiratory failure 2/2 influenza A and aspiration PNA, required intubated 04/13/11 for airway protection and aspirated during intubation. Extubated 1/15 but reintubated emergently 1/18 for severe delerium & resp distress, purulent secretions noted. Trach 1/22, still requiring heavy sedation. 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 Plan PEG 1/27? Marland Kitchen Titrate oral meds to dc IV sedation. Stay in ICU with risk rebleed.   Care during the described time interval was provided by me and/or other providers on the critical care team.  I have reviewed this patient's available data, including medical history, events of note, physical examination and test results as part of my evaluation  CC time x  32 m  Cyril Mourning MD. Tonny Bollman. [facility] Pager [phone] If no response call [phone]

## 2012-05-03 NOTE — Progress Notes (Signed)
ANTICOAGULATION CONSULT NOTE  Pharmacy Consult for heparin Indication: pulmonary embolus  Allergies  Allergen Reactions  . Protamine Anaphylaxis    Patient Measurements: Height: 5\' 10"  (177.8 cm) Weight: 205 lb 11 oz (93.3 kg) IBW/kg (Calculated) : 73  Heparin Dosing Weight: 92 kg  Vital Signs: Temp: 98 F (36.7 C) (01/26 0400) Temp src: Oral (01/26 0400) BP: 96/65 mmHg (01/26 0600) Pulse Rate: 87  (01/26 0600)  Labs:  Basename 05/03/12 0530 05/02/12 0425 05/01/12 0341  HGB 15.7 14.7 --  HCT 48.1 46.2 44.7  PLT 216 193 162  APTT -- -- --  LABPROT -- -- --  INR -- -- --  HEPARINUNFRC 0.32 -- --  CREATININE -- 0.55 0.57  CKTOTAL -- -- --  CKMB -- -- --  TROPONINI -- -- --    Estimated Creatinine Clearance: 108.4 ml/min (by C-G formula based on Cr of 0.55).  Assessment: 62 yo male with h/o atrial fibrillation, now with new PE, for heparin.  Goal of Therapy:  Heparin level 0.3-0.7 units/ml Monitor platelets by anticoagulation protocol: Yes   Plan:  Increase Heparin 2450 units/hr to keep within therapeutic range. Check heparin level in 6 hours.  Eddie Candle 05/03/2012,6:16 AM

## 2012-05-03 NOTE — Progress Notes (Signed)
Was for G-tube tomorrow. Events noted for worsening hypoxia. Now with (B)PE and on heparin drip. Reviewed with IR MD, will not proceed with G-tube at this point. In addition, plain films show developing colonic ileus with large loops overlying stomach in upper abdomen. NG is in good position.

## 2012-05-03 NOTE — Progress Notes (Signed)
CRITICAL CARE RESIDENT NOTE Interim Progress Note  Spoke with IR. Plan for IVC filter in am .  Discussed case with Dr Marin Shutter  Signed: Almyra Deforest, MD  PGY-III, Internal Medicine Resident 05/03/2012, 9:11 PM

## 2012-05-03 NOTE — Progress Notes (Signed)
ANTICOAGULATION CONSULT NOTE  Pharmacy Consult for heparin Indication: pulmonary embolus  Allergies  Allergen Reactions  . Protamine Anaphylaxis    Patient Measurements: Height: 5\' 10"  (177.8 cm) Weight: 205 lb 11 oz (93.3 kg) IBW/kg (Calculated) : 73  Heparin Dosing Weight: 92 kg  Vital Signs: Temp: 98.6 F (37 C) (01/26 0800) Temp src: Oral (01/26 0800) BP: 102/71 mmHg (01/26 1300) Pulse Rate: 85  (01/26 1300)  Labs:  Basename 05/03/12 1200 05/03/12 0535 05/03/12 0530 05/02/12 0425 05/01/12 0341  HGB -- -- 15.7 14.7 --  HCT -- -- 48.1 46.2 44.7  PLT -- -- 216 193 162  APTT -- -- -- -- --  LABPROT -- -- -- -- --  INR -- -- -- -- --  HEPARINUNFRC 0.47 -- 0.32 -- --  CREATININE -- 0.59 -- 0.55 0.57  CKTOTAL -- -- -- -- --  CKMB -- -- -- -- --  TROPONINI -- -- -- -- --    Estimated Creatinine Clearance: 108.4 ml/min (by C-G formula based on Cr of 0.59).  Assessment: 62 yo male with h/o atrial fibrillation, now with new DVT and PE, for heparin. Heparin currently therapuetic, CBC stable, no recent bleeding.   Goal of Therapy:  Heparin level 0.3-0.7 units/ml Monitor platelets by anticoagulation protocol: Yes   Plan:  Continue  2450 units/hr to keep within therapeutic range. Check heparin level in AM.   Vania Rea. Darin Engels.D. Clinical Pharmacist Pager [phone] Phone [phone] 05/03/2012 3:34 PM

## 2012-05-04 ENCOUNTER — Inpatient Hospital Stay (HOSPITAL_COMMUNITY): Payer: Medicaid Other

## 2012-05-04 DIAGNOSIS — I2699 Other pulmonary embolism without acute cor pulmonale: Secondary | ICD-10-CM

## 2012-05-04 LAB — COMPREHENSIVE METABOLIC PANEL
ALT: 39 U/L (ref 0–53)
Alkaline Phosphatase: 77 U/L (ref 39–117)
BUN: 24 mg/dL — ABNORMAL HIGH (ref 6–23)
CO2: 35 mEq/L — ABNORMAL HIGH (ref 19–32)
Calcium: 8.5 mg/dL (ref 8.4–10.5)
GFR calc Af Amer: >90 mL/min (ref >90)
GFR calc non Af Amer: >90 mL/min (ref >90)
Glucose, Bld: 170 mg/dL — ABNORMAL HIGH (ref 70–99)
Sodium: 141 mEq/L (ref 135–145)
Total Protein: 5.5 g/dL — ABNORMAL LOW (ref 6.0–8.3)

## 2012-05-04 LAB — TRIGLYCERIDES: Triglycerides: 189 mg/dL — ABNORMAL HIGH (ref <150)

## 2012-05-04 LAB — HEPARIN LEVEL (UNFRACTIONATED): Heparin Unfractionated: 0.67 IU/mL (ref 0.30–0.70)

## 2012-05-04 LAB — MAGNESIUM: Magnesium: 1.9 mg/dL (ref 1.5–2.5)

## 2012-05-04 LAB — GLUCOSE, CAPILLARY
Glucose-Capillary: 123 mg/dL — ABNORMAL HIGH (ref 70–99)
Glucose-Capillary: 177 mg/dL — ABNORMAL HIGH (ref 70–99)

## 2012-05-04 LAB — CBC
HCT: 46.8 % (ref 39.0–52.0)
MCV: 92.7 fL (ref 78.0–100.0)
Platelets: 211 10*3/uL (ref 150–400)
RBC: 5.05 MIL/uL (ref 4.22–5.81)
WBC: 13.7 10*3/uL — ABNORMAL HIGH (ref 4.0–10.5)

## 2012-05-04 LAB — PHOSPHORUS: Phosphorus: 4.1 mg/dL (ref 2.3–4.6)

## 2012-05-04 MED ORDER — POTASSIUM CHLORIDE 10 MEQ/100ML IV SOLN
INTRAVENOUS | Status: AC
  Administered 2012-05-04: 10 meq
  Filled 2012-05-04: qty 400

## 2012-05-04 MED ORDER — HEPARIN (PORCINE) IN NACL 100-0.45 UNIT/ML-% IJ SOLN
2350.0000 [IU]/h | INTRAMUSCULAR | Status: DC
  Administered 2012-05-04: 2350 [IU]/h via INTRAVENOUS
  Filled 2012-05-04 (×5): qty 250

## 2012-05-04 MED ORDER — IOHEXOL 300 MG/ML  SOLN
100.0000 mL | Freq: Once | INTRAMUSCULAR | Status: AC | PRN
  Administered 2012-05-04: 60 mL via INTRAVENOUS

## 2012-05-04 MED ORDER — POTASSIUM CHLORIDE 20 MEQ/15ML (10%) PO LIQD
ORAL | Status: AC
  Administered 2012-05-04: 40 meq
  Filled 2012-05-04: qty 30

## 2012-05-04 MED ORDER — POTASSIUM CHLORIDE 10 MEQ/100ML IV SOLN: 10.0000 meq | INTRAVENOUS | Status: DC

## 2012-05-04 MED ORDER — POTASSIUM CHLORIDE 20 MEQ/15ML (10%) PO LIQD
40.0000 meq | Freq: Once | ORAL | Status: AC
  Administered 2012-05-04: 40 meq
  Filled 2012-05-04: qty 30

## 2012-05-04 MED ORDER — POTASSIUM CHLORIDE 20 MEQ/15ML (10%) PO LIQD
40.0000 meq | Freq: Once | ORAL | Status: AC
  Filled 2012-05-04: qty 30

## 2012-05-04 NOTE — Progress Notes (Signed)
PULMONARY  / CRITICAL CARE MEDICINE  Name: Cody [name] MRN: [id number] DOB: 07/06/1950    LOS: 23  REFERRING MD:   EDP - Dr. Preston Fleeting  CHIEF COMPLAINT:  Shortness of breath  BRIEF PATIENT DESCRIPTION: 62 yo M with COPD and 3ppd smoking admitted 04/12/2012 with acute respiratory failure 2/2 influenza A and aspiration PNA, required intubated 04/13/11 for airway protection and aspirated during intubation. Extubated 1/15 but reintubated emergently 1/18 for severe delerium & resp distress, purulent secretions noted. Trach 1/22.  LINES / TUBES: ETT 1/5 >>1/15 , 1/18 >> self extubated 1/21 >>> reintubated 1/21 L IJ 1/5 >> 1/13 R IJ 1/13 >> 1/16 RIJ 1/18 >>  R A-line 1/5 >>1/8 pulled out by pt Trach 1/22 IVC Filer 1/27 >>  CULTURES: Blood culture 1/5>> Negative Resp culture 1/6>> H. influenzae  Resp viral panel 1/6>> Influenza A and Metapneumovirus  Sputum Leigonella Culture 1/6>> Negative Bronch Sputum 1/6>> H. influenzae  Urine 1/5>> Neg  Urine 1/6>> Neg  Flu 1/5>> Neg Blood culture 01/13 >> NG Catheter tip culture 1/13 >>Staph -coag neg-70 colonies(vanc sens)  C.diff PCR 1/14 >> Negative Resp culture 1/18 >>> Negative  ANTIBIOTICS: Zosyn 1/5>>1/9 - then restarted 1/22 >>> Azithro 1/5>>1/8  Vanc 1/6>>>1/8 - then restarted 1/13 >>>1/25 Tamiflu 1/6>>>1/16  Rocephin 1/9>>1/15 Ceftazidine 1/18 >>1/22   SIGNIFICANT EVENTS:  1/5 Intubated in ED  1/5 Shock , levophed  1/6 ECHO - 55%, mod dil rv, PA pressures>>> not measured b/c no TR jet  1/6 Bronched- diffuse pus all lobes  1/7 increasing pCO2, increased RR on vent  1/8 Changed sedation to Precedex, more agitated than with versed  1/9 Changed sedation back to Versed, agitation greatly improved  1/10 scheduled haldol  1/12 Restarting precedex gtt due to persistent agitation 1/13 Febrile to 102.6 overnight. ? Catheter infection? DC'ed L IJ, placed new R IJ. 1/14 No fevers overnight, agitation controlled on propofol (added 1/13),  more interactive. 1/15 Had foul smelling stools overnight, therefore, C.diff PCR was sent by RN. 1/15 EXTUBATED, developed A.fib with RVR overnight. 1/16 Persistent episodes of desaturation., afib>started on cardizem drip  1/17 increased agitation  1/18 reintubated  1/20 Persistent A. Fib this morning on dilt drip.  Rates controlled between 99-125 1/21 Self extubated, given a chance and continued to be in respiratory distress so reintubated.  1/22 Tracheostomy, complicated by bleeding, angio edema from protamine 1/23 Continued agitation. 1/23- neg acute ct head 1/24 PEG placement planned --> delayed bc of ileus 1/25 CTA showing PE BL --> started on heparin gtt. 1/26 BL LE DVT with mobile thrombus 1/27 IVC Filter placement.   LEVEL OF CARE:  ICU PRIMARY SERVICE:  PCCM CONSULTANTS:  None CODE STATUS: Full DIET:  Tube feeds DVT Px:  Heparin gtt GI Px:  Protonix   SUBJECTIVE / INTERVAL HISTORY:  Well overnight.   Interval Events: - IVC filter placement on 1/27. Tolerated procedure well. Sedated on propofol/fent gtt -soft BM  VITAL SIGNS: Temp:  [98 F (36.7 C)-99 F (37.2 C)] 99 F (37.2 C) (01/28 0335) Pulse Rate:  [68-119] 82  (01/28 0800) Resp:  [17-24] 18  (01/28 0800) BP: (71-157)/(46-98) 97/67 mmHg (01/28 0840) SpO2:  [89 %-100 %] 100 % (01/28 0811) FiO2 (%):  [40 %-100 %] 40 % (01/28 0840) Weight:  [205 lb 11 oz (93.3 kg)] 205 lb 11 oz (93.3 kg) (01/28 0400) HEMODYNAMICS: VENTILATOR SETTINGS: Vent Mode:  [-] PRVC FiO2 (%):  [40 %-100 %] 40 % Set Rate:  [18 bmp]  18 bmp Vt Set:  [500 mL] 500 mL PEEP:  [5 cmH20] 5 cmH20 Plateau Pressure:  [12 cmH20-14 cmH20] 12 cmH20 INTAKE / OUTPUT: Intake/Output      01/27 0701 - 01/28 0700 01/28 0701 - 01/29 0700   I.V. (mL/kg) 2259.3 (24.2)    NG/GT 60    IV Piggyback 275    Total Intake(mL/kg) 2594.3 (27.8)    Urine (mL/kg/hr) 1690 (0.8)    Total Output 1690    Net +904.3          PHYSICAL EXAMINATION: General:  Tracheostomy in place, on ventilator, opens eyes to command and tracks around the room. Does not follow commands otherwise.  Head: Normocephalic, Trach in place, no bleeding.  Lungs:  Diminished BS, course rhonchi.    Heart: Irregularly irregular rhythm, regular rate.  Abdomen:  BS normoactive. Soft, Nondistended, non-tender.  No masses or organomegaly.  Extremities: No pretibial edema.     IMAGING: 1/25 - CTA chest - 1. Pulmonary embolus within segmental branches to the right upper lobe, left upper lobe and left lower lobe, extending distally. 2. Bilateral lower lobe airspace opacification is thought to reflect atelectasis, with trace bilateral pleural effusions. No definite evidence for aspiration. 3. Mild nonspecific left upper lobe opacities may reflect atelectasis. 1/26 - Abd XR - Interval development of a moderate colonic ileus since the examination 2 days ago. No evidence of bowel obstruction. 1/26 - LE Dopplers - Findings consistent with acute deep vein thrombosis involving the right saphenofemoral junction andright common femoral vein. - Findings consistent with acute deep vein thrombosis involving the left saphenofemoral junction, left femoral vein, and mobile thrombus in the left common femoral vein. Unable to visualize the left popliteal vein due to patient position, therefore cannot exclude thrombosis in this vein. 1/28 - pCXR - Increased bibasilar atelectasis/infiltrates and probable increase in left pleural fluid.   DIAGNOSES: Principal Problem:  *Acute respiratory failure with hypercapnia Active Problems:  HYPERLIPIDEMIA  TOBACCO USE  HYPERTENSION  GERD  COPD (chronic obstructive pulmonary disease)  Acute encephalopathy  Acute kidney injury  Atrial fibrillation  PE (pulmonary embolism)  ASSESSMENT / PLAN:  PULMONARY  Lab 05/02/12 2115 04/30/12 0839 04/28/12 1702  PHART 7.475* 7.337* 7.352  PCO2ART 53.7* 68.1* 66.9*  PO2ART 52.3* 84.0 78.0*  HCO3 39.1* 36.4* 37.0*   TCO2 40.7 38 39   A: 1) Acute hypercapnic respiratory failure/ARDS - secondary to flu, COPD exacerbation. Trach 1/22. 2) Aspiration - at time of intubation, finished course of treatment  3) Pneumonitis vs. CAP - b/l interstitial opacities on cxr, slightly increased at left, likely atelectasis. Completed 8/8 days rocephin , on tapering steroids  4) Pulmonary edema - improved. 4) Non-oxygen dependent COPD - h/o chronic tobacco abuse hx 3 pack per day 20+ years. On symbicort at home. Not on home O2 therapy. 5) Bloody secretions from aspiration - Pulm /renal syndrome ruled out. 6) Upper airway severe angio edema from protamine  7) Likley small hematoma on rt apex from neck oozing  P:  - SBTs now that tachy resolved, goal ATC - Duonebs q6h + q2h PRN. - Taper prednisone post-trach for angio edema - rapid taper - Smoking cessation  CARDIOVASCULAR No results found for this basename: CKTOTAL:3,CKMB:3,TROPONINI:3 in the last 168 hours No results found for this basename: PROBNP:3 in the last 168 hours A:  1) New onset atrial fibrillation with RVR - on cardizem PO and Hep prior to trach 1/22. Converted to NSR and rate controlled overnight. 2) Elevated proBNP -  ProBNP on admission 17911 --> 398 on 1/12. Trop x3 negative. ECHO with normal systolic and diastolic dysfunction, EF 50-55%.  3) HTN -   home regimen Lisinopril 40mg , Norvasc 5mg , and HCTZ 25mg  qd. - on hold 2/2 hypotension 4) Hypotension - mild. 5) Anaphylaxis to protamine - protamine administered to reverse heparin in setting of trach, anaphylactic reaction. Solumedrol, pepcid, benadryl given with stabilization. Protamine added to allergy list.  PLAN:  - Goal HR < 120, cardizem  PO & amio gtt,  transition amio to po for short duration - Continue lasix.   RENAL  Lab 05/05/12 0420 05/04/12 1517 05/03/12 0535 05/02/12 0425  NA 141 141 140 --  K 3.2* 3.4* 2.6* --  CL 100 99 97 --  CO2 30 35* 38* --  BUN 27* 24* 28* --  CREATININE  0.62 0.60 0.59 --  GLUCOSE 110* 170* 129* --  MG 1.8 1.9 -- 2.3  PHOS 2.6 4.1 -- 2.9    Intake/Output Summary (Last 24 hours) at 05/05/12 0851 Last data filed at 05/05/12 0700  Gross per 24 hour  Intake 2419.17 ml  Output   1640 ml  Net 779.17 ml    A:  1) Acute Kidney injury - Resolved. Previously likely pre-renal in setting of volume depletion. Previously urinary sediment clogging foley, improved. Continue to monitor in setting of lasix use. 2) Hypernatremia - resolved  3) Hypokalemia - in setting of lasix.  P:  - Goal to keep even. - Lasix at current 40mg  qday. - Continue daily    GASTROINTESTINAL  Lab 05/05/12 0420 05/04/12 1517 05/02/12 0425  AST 24 18 27   ALT 33 39 37  ALKPHOS 72 77 72  BILITOT 0.9 0.8 0.8  PROT 5.6* 5.5* 6.0  ALBUMIN 2.3* 2.3* 2.3*   A:  1) GERD 2) Diarrhea - resolving. C. Diff negative. 3) Colonic Ileus - has had BM x 2 4) Paraesophageal nodes - need FU CT in 3-4 mnths P: - Continue TF  - Pepcid - PEG delayed with lack of contrast, note colonic ileus - Minimise narcotics, reglan 10 IV q 8h x 6 doses  HEMATOLOGIC  Lab 05/05/12 0420 05/04/12 1517 05/04/12 0430 05/03/12 0530 04/29/12 1328  WBC 12.6* -- 13.7* 13.0* --  HGB 15.1 -- 15.1 15.7 --  HCT 47.4 -- 46.8 48.1 --  PLT 206 -- 211 216 --  APTT -- -- -- -- --  INR -- 0.94 -- -- 0.96    A:  1) Polycythemia- Hgb stable.  2) Leukocytosis -  Increased this AM, likely in setting of high dose steroids and hemoconcetration - no fever. Prior DC of left IJ on 1/13 (cath tip infection-staph/coag neg). 3) Anticoagulation in setting of atrial fibrillation - CHADS score of 3. Heparin and coumadin held prior to trach 1/22 and coags wnl. 4) Bilateral PE 5) Bilateral DVT - IVC filter placement 1/27  P:  - IV heparin for VTE's - No coumadin for now until peg done - SCD  INFECTIOUS  Lab 04/29/12 0446  LATICACIDVEN --  PROCALCITON 0.17   1) Flu pneumonia - Flu A and metapneumovirus  >finished full 10-day course of Tamiflu, droplet isolation d/c 1/18 2) Aspirated at time of intubation, pneumonitis vs PNA>finished flu course of Rocephin. Aspirated again during reintubation.  3) Nosocomial infection -  Left IJ (in 8 days) was dc'ed on 1/13 and catheter tip cultures>staph/coag neg . Was resumed Vanc on 1/13 -->  4) Diarrhea - C.diff neg. 5) Leukocytosis - likely  2/2 high dose steroids. Remains afebrile, Ceftaz added 1/18 w/ sputum cx negative. Procalcitonin 0.14 --> 0.15. 6) new fever ? pe /dvt related   P:  - Continue Zosyn day 7 of 7 for aspiration PNA --> DC after today - Continue to monitor for s/s of additional infection, observe off abx  ENDOCRINE  Lab 05/05/12 0331 05/04/12 2329 05/04/12 1954 05/04/12 1640 05/04/12 1312  GLUCAP 118* 114* 126* 136* 177*   Lab Results  Component Value Date   HGBA1C 7.1* 04/24/2012   TSH 1.494 04/25/2012    A:  1) Diabetes mellitus, type 2 (A1c 7.1) - possible iatrogenic secondary to prolonged steroid use. 2) On steroids - CBGs at goal. Lantus off. P:  - Cont ICU Hyperglycemic protocol - escalate scale. - CBG monitoring q4h while NPO. - Continue steroids.  NEUROLOGIC / PSYCHIATRIC   A:  1) Acute encephalopathy - Resolved. Likely secondary to hypoxia / hypercarbia and possible infectious process (CAP?). >returned 1/18 >requiring intubation  2) Delerium - persistent, controlled on propofol and fentanyl, requiring high doses, inhibiting weaning.   P:  - dc propofol + fentanyl gtt, wean as able. Goal to dc propofol - Continue methadone and seroquel , fent prn - Monitor QTc - 490 ms (1/28) - Delirium, avoid benzos - Does not qualify for LTAC, SNF/trach beds requested (possibly out of state) - PT/ OT consult. - Ativan 1 q12h  CLINICAL SUMMARY: 62 yo M with COPD and 3ppd smoking admitted 04/12/2012 with acute respiratory failure 2/2 influenza A and aspiration PNA, required intubated 04/13/11 for airway protection and aspirated  during intubation. Extubated 1/15 but reintubated emergently 1/18 for severe delerium & resp distress, purulent secretions noted. Trach 1/22, still requiring heavy sedation. 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  IVC Filter placement 1/27. TO  Move to SDu once off drips  Signed: Johnette Abraham, D.OConsuello Bossier, Internal Medicine Resident Pager: [phone] (7AM-5PM)  Care during the described time interval was provided by me and/or other providers on the critical care team.  I have reviewed this patient's available data, including medical history, events of note, physical examination and test results as part of my evaluation  CC time x 32 m   Chihiro Frey V.

## 2012-05-04 NOTE — Clinical Social Work Placement (Addendum)
    Clinical Social Work Department CLINICAL SOCIAL WORK PLACEMENT NOTE 05/04/2012  Patient:  ABIGAIL, MARSIGLIA  Account Number:  [id number] Admit date:  04/12/2012  Clinical Social Worker:  Hulan Fray  Date/time:  05/04/2012 11:37 AM  Clinical Social Work is seeking post-discharge placement for this patient at the following level of care:   SKILLED NURSING   (*CSW will update this form in Epic as items are completed)   05/01/2012  Patient/family provided with [facility] Department of Clinical Social Work's list of facilities offering this level of care within the geographic area requested by the patient (or if unable, by the patient's family).  05/01/2012  Patient/family informed of their freedom to choose among providers that offer the needed level of care, that participate in Medicare, Medicaid or managed care program needed by the patient, have an available bed and are willing to accept the patient.  05/01/2012  Patient/family informed of [facility]' ownership interest in [facility], as well as of the fact that they are under no obligation to receive care at this facility.  PASARR submitted to EDS on 05/04/2012 PASARR number received from [employer] on 05/04/2012  FL2 transmitted to all facilities in geographic area requested by pt/family on  05/01/2012 FL2 transmitted to all facilities within larger geographic area on   Patient informed that his/her managed care company has contracts with or will negotiate with  certain facilities, including the following:     Patient/family informed of bed offers received:   Patient chooses bed at  Physician recommends and patient chooses bed at    Patient to be transferred to  on   Patient to be transferred to facility by   The following physician request were entered in Epic:   Additional Comments: Clinical Social Worker has submitted vent snf referrals to [place] and [place] facilities.

## 2012-05-04 NOTE — ED Notes (Signed)
Patient is on Propofol  Drip and Fentanyl Drip per Critical Care Protocol, will titrate drips if needed otherwise no additional sedation needed for procedure.

## 2012-05-04 NOTE — H&P (Signed)
Cody [name] is an 62 y.o. male.   Chief Complaint: #6 ppd smoker COPD exacerbation Vent/trach afib Developed B PE and DVT ON Heparin Scheduled now for retrievable Inferior Vena Cava Filter Placement  HPI: CVA; HTN; HLD; COPD; PE/DVT; Janina Mayo  Past Medical History  Diagnosis Date  . Stroke   . Hypertension   . Hyperlipidemia   . GERD (gastroesophageal reflux disease)   . COPD (chronic obstructive pulmonary disease)     History reviewed. No pertinent past surgical history.  Family History  Problem Relation Age of Onset  . COPD Mother    Social History:  reports that he has been smoking Cigarettes.  He has been smoking about .5 packs per day. He has never used smokeless tobacco. He reports that he does not drink alcohol or use illicit drugs.  Allergies:  Allergies  Allergen Reactions  . Protamine Anaphylaxis    Medications Prior to Admission  Medication Sig Dispense Refill  . amLODipine (NORVASC) 5 MG tablet Take 1 tablet (5 mg total) by mouth daily.  90 tablet  3  . aspirin 81 MG tablet Take 81 mg by mouth daily.        . budesonide-formoterol (SYMBICORT) 160-4.5 MCG/ACT inhaler Inhale 2 puffs into the lungs 2 (two) times daily.  1 Inhaler  3  . hydrochlorothiazide (HYDRODIURIL) 25 MG tablet Take 1 tablet (25 mg total) by mouth daily.  90 tablet  3  . Ibuprofen-Diphenhydramine HCl (ADVIL PM) 200-25 MG CAPS Take 1 capsule by mouth at bedtime as needed. For sleep      . lisinopril (PRINIVIL,ZESTRIL) 40 MG tablet Take 1 tablet (40 mg total) by mouth daily.  90 tablet  3    Results for orders placed during the hospital encounter of 04/12/12 (from the past 48 hour(s))  GLUCOSE, CAPILLARY     Status: Abnormal   Collection Time   05/02/12 12:40 PM      Component Value Range Comment   Glucose-Capillary 174 (*) 70 - 99 mg/dL   GLUCOSE, CAPILLARY     Status: Abnormal   Collection Time   05/02/12  4:23 PM      Component Value Range Comment   Glucose-Capillary 154 (*) 70 - 99  mg/dL   GLUCOSE, CAPILLARY     Status: Abnormal   Collection Time   05/02/12  7:36 PM      Component Value Range Comment   Glucose-Capillary 119 (*) 70 - 99 mg/dL   BLOOD GAS, ARTERIAL     Status: Abnormal   Collection Time   05/02/12  9:15 PM      Component Value Range Comment   FIO2 0.40      Delivery systems VENTILATOR      Mode PRESSURE REGULATED VOLUME CONTROL      VT 500.0      Rate 18.0      Peep/cpap 5.0      pH, Arterial 7.475 (*) 7.350 - 7.450    pCO2 arterial 53.7 (*) 35.0 - 45.0 mmHg    pO2, Arterial 52.3 (*) 80.0 - 100.0 mmHg    Bicarbonate 39.1 (*) 20.0 - 24.0 mEq/L    TCO2 40.7  0 - 100 mmol/L    Acid-Base Excess 14.4 (*) 0.0 - 2.0 mmol/L    O2 Saturation 86.7      Patient temperature 98.6      Collection site RIGHT RADIAL      Drawn by [id number]      Sample type ARTERIAL DRAW  Allens test (pass/fail) PASS  PASS   GLUCOSE, CAPILLARY     Status: Abnormal   Collection Time   05/02/12 11:32 PM      Component Value Range Comment   Glucose-Capillary 141 (*) 70 - 99 mg/dL   GLUCOSE, CAPILLARY     Status: Abnormal   Collection Time   05/03/12  3:47 AM      Component Value Range Comment   Glucose-Capillary 154 (*) 70 - 99 mg/dL   HEPARIN LEVEL (UNFRACTIONATED)     Status: Normal   Collection Time   05/03/12  5:30 AM      Component Value Range Comment   Heparin Unfractionated 0.32  0.30 - 0.70 IU/mL   CBC     Status: Abnormal   Collection Time   05/03/12  5:30 AM      Component Value Range Comment   WBC 13.0 (*) 4.0 - 10.5 K/uL    RBC 5.15  4.22 - 5.81 MIL/uL    Hemoglobin 15.7  13.0 - 17.0 g/dL    HCT 69.6  29.5 - 28.4 %    MCV 93.4  78.0 - 100.0 fL    MCH 30.5  26.0 - 34.0 pg    MCHC 32.6  30.0 - 36.0 g/dL    RDW 13.2 (*) 44.0 - 15.5 %    Platelets 216  150 - 400 K/uL   BASIC METABOLIC PANEL     Status: Abnormal   Collection Time   05/03/12  5:35 AM      Component Value Range Comment   Sodium 140  135 - 145 mEq/L    Potassium 2.6 (*) 3.5 - 5.1 mEq/L     Chloride 97  96 - 112 mEq/L    CO2 38 (*) 19 - 32 mEq/L    Glucose, Bld 129 (*) 70 - 99 mg/dL    BUN 28 (*) 6 - 23 mg/dL    Creatinine, Ser 1.02  0.50 - 1.35 mg/dL    Calcium 8.7  8.4 - 72.5 mg/dL    GFR calc non Af Amer >90  >90 mL/min    GFR calc Af Amer >90  >90 mL/min   GLUCOSE, CAPILLARY     Status: Abnormal   Collection Time   05/03/12  8:00 AM      Component Value Range Comment   Glucose-Capillary 171 (*) 70 - 99 mg/dL   HEPARIN LEVEL (UNFRACTIONATED)     Status: Normal   Collection Time   05/03/12 12:00 PM      Component Value Range Comment   Heparin Unfractionated 0.47  0.30 - 0.70 IU/mL   GLUCOSE, CAPILLARY     Status: Abnormal   Collection Time   05/03/12 12:18 PM      Component Value Range Comment   Glucose-Capillary 217 (*) 70 - 99 mg/dL   GLUCOSE, CAPILLARY     Status: Abnormal   Collection Time   05/03/12  4:07 PM      Component Value Range Comment   Glucose-Capillary 176 (*) 70 - 99 mg/dL   GLUCOSE, CAPILLARY     Status: Abnormal   Collection Time   05/03/12  7:53 PM      Component Value Range Comment   Glucose-Capillary 135 (*) 70 - 99 mg/dL   GLUCOSE, CAPILLARY     Status: Abnormal   Collection Time   05/03/12 11:50 PM      Component Value Range Comment   Glucose-Capillary 126 (*) 70 - 99 mg/dL  GLUCOSE, CAPILLARY     Status: Abnormal   Collection Time   05/04/12  4:03 AM      Component Value Range Comment   Glucose-Capillary 123 (*) 70 - 99 mg/dL   HEPARIN LEVEL (UNFRACTIONATED)     Status: Normal   Collection Time   05/04/12  4:30 AM      Component Value Range Comment   Heparin Unfractionated 0.67  0.30 - 0.70 IU/mL   CBC     Status: Abnormal   Collection Time   05/04/12  4:30 AM      Component Value Range Comment   WBC 13.7 (*) 4.0 - 10.5 K/uL    RBC 5.05  4.22 - 5.81 MIL/uL    Hemoglobin 15.1  13.0 - 17.0 g/dL    HCT 16.1  09.6 - 04.5 %    MCV 92.7  78.0 - 100.0 fL    MCH 29.9  26.0 - 34.0 pg    MCHC 32.3  30.0 - 36.0 g/dL    RDW 40.9 (*) 81.1  - 15.5 %    Platelets 211  150 - 400 K/uL   TRIGLYCERIDES     Status: Abnormal   Collection Time   05/04/12  4:30 AM      Component Value Range Comment   Triglycerides 189 (*) <150 mg/dL    Ct Angio Chest Pe W/cm &/or Wo Cm  05/02/2012  *RADIOLOGY REPORT*  Clinical Data: Worsening hypoxia.  CT ANGIOGRAPHY CHEST  Technique:  Multidetector CT imaging of the chest using the standard protocol during bolus administration of intravenous contrast. Multiplanar reconstructed images including MIPs were obtained and reviewed to evaluate the vascular anatomy.  Contrast: OMNIPAQUE IOHEXOL 350 MG/ML SOLN  Comparison: Chest radiograph performed earlier today at 07:48 a.m.  Findings: There is pulmonary embolus within segmental branches to the right upper lobe, left upper lobe and left lower lobe, extending distally.  Bilateral lower lobe airspace opacities are thought to reflect atelectasis, with trace bilateral pleural effusions.  No definite endobronchial debris is seen to suggest aspiration.  Mild nonspecific left upper lobe opacities may reflect atelectasis. There is no evidence of pneumothorax.  The patient's tracheostomy tube is seen ending 6-7 cm above the carina.  Air within the esophagus may be transient in nature, or could reflect mild dysmotility secondary to the patient's enteric tube.  The enteric tube is seen extending into the stomach.  There is aneurysmal dilatation of the ascending thoracic aorta to 4.4 cm in AP dimension.  Apparent prominent nodes are noted at the azygoesophageal recess, surrounding the mid-esophagus, measuring up to 1.3 cm in short axis.  There is also a mildly prominent precarinal node, measuring 1.1 cm in short axis.  No pericardial effusion is identified.  The great vessels are grossly unremarkable in appearance.  Scattered coronary artery calcifications are seen.  No axillary lymphadenopathy is seen.  The visualized portions of the thyroid gland are unremarkable in appearance.   The visualized portions of the liver and spleen are unremarkable. Nonspecific perinephric stranding is noted.  No acute osseous abnormalities are seen.  IMPRESSION:  1.  Pulmonary embolus within segmental branches to the right upper lobe, left upper lobe and left lower lobe, extending distally. 2.  Bilateral lower lobe airspace opacification is thought to reflect atelectasis, with trace bilateral pleural effusions.  No definite evidence for aspiration. 3.  Mild nonspecific left upper lobe opacities may reflect atelectasis.  4.  Air within the esophagus may be transient in nature,  or could reflect mild dysmotility secondary to the patient's enteric tube. 5.  Aneurysmal dilatation of the ascending thoracic aorta to 4.4 cm in AP dimension. 6.  Prominent nodes at the azygoesophageal recess, surrounding the mid-esophagus, measuring up to 1.3 cm in short axis.  Mildly prominent precarinal node, measuring 1.1 cm in short axis.  These are nonspecific.  No definite esophageal mass seen on CT, though the location of some of the paraesophageal nodes is somewhat unusual.  Further evaluation could be considered when and as deemed clinically appropriate. 7.  Scattered coronary artery calcifications seen.  Critical Value/emergent results were called by telephone at the time of interpretation on 05/02/2012 at 11:11 p.m. to Jodie RN on [id number], who verbally acknowledged these results.   Original Report Authenticated By: Tonia Ghent, M.D.    Dg Chest Port 1 View  05/02/2012  *RADIOLOGY REPORT*  Clinical Data: Ventilator dependent respiratory failure.  Follow up atelectasis.  PORTABLE CHEST - 1 VIEW 05/02/2012 0748 hours:  Comparison: Portable chest x-ray yesterday and dating back 04/25/2012.  Findings: Tracheostomy tube tip in satisfactory position below the thoracic inlet projecting approximately 7-8 cm above the carina. Right jugular central venous catheter tip projects over the upper SVC, unchanged.  Nasogastric courses  below the diaphragm into the stomach.  Interval improvement in aeration in the lung bases since yesterday, with only mild atelectasis persisting.  No new pulmonary parenchymal abnormalities.  Cardiac silhouette enlarged but stable. Mild pulmonary venous hypertension without overt edema.  IMPRESSION: Support apparatus satisfactory.  Improved aeration in the lung bases, with only mild atelectasis persisting.  No new abnormalities.   Original Report Authenticated By: Hulan Saas, M.D.    Dg Abd Portable 1v  05/03/2012  *RADIOLOGY REPORT*  Clinical Data: Abdominal distention.  PORTABLE ABDOMEN - 1 VIEW 05/03/2012 0658 hours:  Comparison: Portable abdomen x-ray 05/01/2012.  Findings: Previously administered barium is now present within the ascending colon and the rectum.  Interval development of moderate gaseous distention of the colon.  Gas within normal caliber small bowel.  No suggestion of free air on the supine image.  Nasogastric tube tip in the region of the gastric pylorus or duodenal bulb.  IMPRESSION: Interval development of a moderate colonic ileus since the examination 2 days ago.  No evidence of bowel obstruction.   Original Report Authenticated By: Hulan Saas, M.D.     Review of Systems  Constitutional: Negative for fever.  Respiratory: Positive for shortness of breath.   Cardiovascular: Negative for chest pain.  Gastrointestinal: Negative for nausea and vomiting.    Blood pressure 122/77, pulse 101, temperature 98.5 F (36.9 C), temperature source Oral, resp. rate 19, height 5\' 10"  (1.778 m), weight 207 lb 7.3 oz (94.1 kg), SpO2 97.00%. Physical Exam  Constitutional: He appears well-developed.  Cardiovascular: Normal rate.   No murmur heard.      Irreg  Respiratory: Effort normal. He has wheezes.  GI: Soft. Bowel sounds are normal.  Musculoskeletal:       Moving hands well; not to command  Neurological:       In and out; drowsy; on trach/vent  Skin: Skin is warm and dry.       Assessment/Plan COPD exacerbation; vent/trach Afib/off coumadin - on Heparin Developed B PE and L DVT Scheduled now for Retrievable IVC filter pts family aware of procedure benefits and risks and agreeable to proceed Consent signed via phone with son Minerva Areola Consent in chart Hold hep ON CALL to xray BUN high; will check today  result  Kimimila Tauzin A 05/04/2012, 8:09 AM

## 2012-05-04 NOTE — Progress Notes (Signed)
UR Completed.  Vangie Bicker [phone] 05/04/2012

## 2012-05-04 NOTE — Progress Notes (Signed)
ANTICOAGULATION CONSULT NOTE  Pharmacy Consult for heparin Indication: pulmonary embolus and DVT  Allergies  Allergen Reactions  . Protamine Anaphylaxis    Patient Measurements: Height: 5\' 10"  (177.8 cm) Weight: 207 lb 7.3 oz (94.1 kg) IBW/kg (Calculated) : 73  Heparin Dosing Weight: 92 kg  Vital Signs: Temp: 98.5 F (36.9 C) (01/27 0400) Temp src: Oral (01/27 0400) BP: 122/77 mmHg (01/27 0747) Pulse Rate: 101  (01/27 0747)  Labs:  Basename 05/04/12 0430 05/03/12 1200 05/03/12 0535 05/03/12 0530 05/02/12 0425  HGB 15.1 -- -- 15.7 --  HCT 46.8 -- -- 48.1 46.2  PLT 211 -- -- 216 193  APTT -- -- -- -- --  LABPROT -- -- -- -- --  INR -- -- -- -- --  HEPARINUNFRC 0.67 0.47 -- 0.32 --  CREATININE -- -- 0.59 -- 0.55  CKTOTAL -- -- -- -- --  CKMB -- -- -- -- --  TROPONINI -- -- -- -- --    Estimated Creatinine Clearance: 108.8 ml/min (by C-G formula based on Cr of 0.59).  Assessment: 62 yo male with h/o atrial fibrillation, now with new DVT and PE, for heparin. Heparin currently therapuetic but at upper end of goal and trending up, CBC stable, no recent bleeding. Scheduled for IVC filter today. Orders to hold heparin on call and resume 1 hour post filter  Goal of Therapy:  Heparin level 0.3-0.7 units/ml Monitor platelets by anticoagulation protocol: Yes   Plan:  Decrease to 2350 units/hr to keep within therapeutic range. Follow post IVC filter   Sheppard Coil, Pharm.D. Clinical Pharmacist Phone [phone] 05/04/2012 8:22 AM

## 2012-05-04 NOTE — Procedures (Signed)
Successful DENALI IVC FILTER INSERTION NO COMP STABLE FULL REPORT IN PACS

## 2012-05-04 NOTE — Progress Notes (Signed)
PULMONARY  / CRITICAL CARE MEDICINE  Name: Cody [name] MRN: [id number] DOB: 1950/08/03    LOS: 22  REFERRING MD:   EDP - Dr. Preston Fleeting  CHIEF COMPLAINT:  Shortness of breath  BRIEF PATIENT DESCRIPTION: 62 yo M with COPD and 3ppd smoking admitted 04/12/2012 with acute respiratory failure 2/2 influenza A and aspiration PNA, required intubated 04/13/11 for airway protection and aspirated during intubation. Extubated 1/15 but reintubated emergently 1/18 for severe delerium & resp distress, purulent secretions noted. Trach 1/22.  LINES / TUBES: ETT 1/5 >>1/15 , 1/18 >> self extubated 1/21 >>> reintubated 1/21 L IJ 1/5 >> 1/13 R IJ 1/13 >> 1/16 RIJ 1/18 >>  R A-line 1/5 >>1/8 pulled out by pt Trach 1/22 IVC Filer 1/27 >>  CULTURES: Blood culture 1/5>> Negative Resp culture 1/6>> H. influenzae  Resp viral panel 1/6>> Influenza A and Metapneumovirus  Sputum Leigonella Culture 1/6>> Negative Bronch Sputum 1/6>> H. influenzae  Urine 1/5>> Neg  Urine 1/6>> Neg  Flu 1/5>> Neg Blood culture 01/13 >> NG Catheter tip culture 1/13 >>Staph -coag neg-70 colonies(vanc sens)  C.diff PCR 1/14 >> Negative Resp culture 1/18 >>> Negative  ANTIBIOTICS: Zosyn 1/5>>1/9 - then restarted 1/22 >>> Azithro 1/5>>1/8  Vanc 1/6>>>1/8 - then restarted 1/13 >>>1/25 Tamiflu 1/6>>>1/16  Rocephin 1/9>>1/15 Ceftazidine 1/18 >>1/22   SIGNIFICANT EVENTS:  1/5 Intubated in ED  1/5 Shock , levophed  1/6 ECHO - 55%, mod dil rv, PA pressures>>> not measured b/c no TR jet  1/6 Bronched- diffuse pus all lobes  1/7 increasing pCO2, increased RR on vent  1/8 Changed sedation to Precedex, more agitated than with versed  1/9 Changed sedation back to Versed, agitation greatly improved  1/10 scheduled haldol  1/12 Restarting precedex gtt due to persistent agitation 1/13 Febrile to 102.6 overnight. ? Catheter infection? DC'ed L IJ, placed new R IJ. 1/14 No fevers overnight, agitation controlled on propofol (added 1/13),  more interactive. 1/15 Had foul smelling stools overnight, therefore, C.diff PCR was sent by RN. 1/15 EXTUBATED, developed A.fib with RVR overnight. 1/16 Persistent episodes of desaturation., afib>started on cardizem drip  1/17 increased agitation  1/18 reintubated  1/20 Persistent A. Fib this morning on dilt drip.  Rates controlled between 99-125 1/21 Self extubated, given a chance and continued to be in respiratory distress so reintubated.  1/22 Tracheostomy, complicated by bleeding, angio edema from protamine 1/23 Continued agitation. 1/23- neg acute ct head 1/24 PEG placement planned --> delayed bc of ileus 1/25 CTA showing PE BL --> started on heparin gtt. 1/26 BL LE DVT with mobile thrombus 1/27 Planned for IVC Filter placement.   LEVEL OF CARE:  ICU PRIMARY SERVICE:  PCCM CONSULTANTS:  None CODE STATUS: Full DIET:  NPO DVT Px:  Heparin gtt GI Px:  Protonix   SUBJECTIVE / INTERVAL HISTORY:  Rates better controlled after starting amiodarone + cardizem Agitation better controlled with seroquel and methadone Confirmed BL DVT per LE dopplers --> IR consulted --> plan for IVF filter placement 1/27    VITAL SIGNS: Temp:  [98.5 F (36.9 C)-98.9 F (37.2 C)] 98.5 F (36.9 C) (01/27 0400) Pulse Rate:  [71-128] 101  (01/27 0747) Resp:  [13-25] 19  (01/27 0747) BP: (75-192)/(48-114) 122/77 mmHg (01/27 0747) SpO2:  [90 %-100 %] 97 % (01/27 0747) FiO2 (%):  [40 %-50 %] 40 % (01/27 0747) Weight:  [207 lb 7.3 oz (94.1 kg)] 207 lb 7.3 oz (94.1 kg) (01/27 0500) HEMODYNAMICS: VENTILATOR SETTINGS: Vent Mode:  [-] PRVC  FiO2 (%):  [40 %-50 %] 40 % Set Rate:  [18 bmp] 18 bmp Vt Set:  [500 mL] 500 mL PEEP:  [5 cmH20] 5 cmH20 Pressure Support:  [10 cmH20] 10 cmH20 Plateau Pressure:  [13 cmH20-19 cmH20] 13 cmH20 INTAKE / OUTPUT: Intake/Output      01/26 0701 - 01/27 0700 01/27 0701 - 01/28 0700   I.V. (mL/kg) 2210 (23.5)    NG/GT 860    IV Piggyback 400    Total Intake(mL/kg)  3470 (36.9)    Urine (mL/kg/hr) 1815 (0.8) 50   Total Output 1815 50   Net +1655 -50        Urine Occurrence 600 x     PHYSICAL EXAMINATION: General: Sedated and intubated   Head: Normocephalic, Trach in place, no bleeding.  Lungs:  Diminished BS, course rhonchi.    Heart: Irregularly irregular rhythm, regular rate.  Abdomen:  BS normoactive. Soft, Nondistended, non-tender.  No masses or organomegaly.  Extremities: No pretibial edema.    IMAGING:  1/24 - KUB - Contrast noted filling the distal esophagus and stomach, progressing into the proximal small bowel. 1/25 - pCXR - Improved aeration 1/25 - CTA chest - 1. Pulmonary embolus within segmental branches to the right upper lobe, left upper lobe and left lower lobe, extending distally. 2. Bilateral lower lobe airspace opacification is thought to reflect atelectasis, with trace bilateral pleural effusions. No definite evidence for aspiration. 3. Mild nonspecific left upper lobe opacities may reflect atelectasis. 1/26 - Abd XR - Interval development of a moderate colonic ileus since the examination 2 days ago. No evidence of bowel obstruction. 1/26 - LE Dopplers - Findings consistent with acute deep vein thrombosis involving the right saphenofemoral junction andright common femoral vein. - Findings consistent with acute deep vein thrombosis involving the left saphenofemoral junction, left femoral vein, and mobile thrombus in the left common femoral vein. Unable to visualize the left popliteal vein due to patient position, therefore cannot exclude thrombosis in this vein.   DIAGNOSES: Principal Problem:  *Acute respiratory failure with hypercapnia Active Problems:  HYPERLIPIDEMIA  TOBACCO USE  HYPERTENSION  GERD  COPD (chronic obstructive pulmonary disease)  Acute encephalopathy  Acute kidney injury  Atrial fibrillation  PE (pulmonary embolism)  ASSESSMENT / PLAN:  PULMONARY  Lab 05/02/12 2115 04/30/12 0839 04/28/12 1702    PHART 7.475* 7.337* 7.352  PCO2ART 53.7* 68.1* 66.9*  PO2ART 52.3* 84.0 78.0*  HCO3 39.1* 36.4* 37.0*  TCO2 40.7 38 39   A: 1) Acute hypercapnic respiratory failure/ARDS - secondary to flu, COPD exacerbation. Trach 1/22. 2) Aspiration - at time of intubation, finished course of treatment , restarted Vanc 1/13 fever. 3) Pneumonitis vs. CAP - b/l interstitial opacities on cxr, slightly increased at left, likely atelectasis. Completed 8/8 days rocephin , on tapering steroids  4) Pulmonary edema - improved. 4) Non-oxygen dependent COPD - h/o chronic tobacco abuse hx 3 pack per day 20+ years. On symbicort at home. Not on home O2 therapy. 5) Bloody secretions from aspiration - Pulm /renal syndrome ruled out. 6) Upper airway severe angio edema from protamine  7) Likley small hematoma on rt apex from neck oozing  P:  - SBTs now that tachy resolved - Duonebs q6h + q2h PRN. - PO prednisone 40 mg  post-trach for angio edema - rapid taper - Smoking cessation  CARDIOVASCULAR No results found for this basename: CKTOTAL:3,CKMB:3,TROPONINI:3 in the last 168 hours No results found for this basename: PROBNP:3 in the last 168 hours A:  1) New onset atrial fibrillation with RVR - on cardizem PO and Hep prior to trach 1/22. Converted to NSR  2) Elevated proBNP - ProBNP on admission 17911 --> 398 on 1/12. Trop x3 negative. ECHO with normal systolic and diastolic dysfunction, EF 50-55%.  3) HTN -   home regimen Lisinopril 40mg , Norvasc 5mg , and HCTZ 25mg  qd. -on hold  4) Hypotension - Resolved. 5) Anaphylaxis to protamine - protamine administered to reverse heparin in setting of trach, anaphylactic reaction. Solumedrol, pepcid, benadryl given with stabilization. Protamine added to allergy list.  PLAN:  - Goal HR < 120, cardizem  PO & amio gtt - Continue lasix.   RENAL  Lab 05/03/12 0535 05/02/12 0425 05/01/12 0341 04/30/12 0500  NA 140 141 138 --  K 2.6* 3.4* 4.2 --  CL 97 98 96 --  CO2 38* 39*  38* --  BUN 28* 30* 28* --  CREATININE 0.59 0.55 0.57 --  GLUCOSE 129* 123* 147* --  MG -- 2.3 2.2 2.1  PHOS -- 2.9 2.7 3.1    Intake/Output Summary (Last 24 hours) at 05/04/12 0802 Last data filed at 05/04/12 0737  Gross per 24 hour  Intake 3347.73 ml  Output   1865 ml  Net 1482.73 ml    A:  1) Acute Kidney injury - Resolved. Previously likely pre-renal in setting of volume depletion. Previously urinary sediment clogging foley, improved. Continue to monitor in setting of lasix use. 2) Hypernatremia - resolved  3) Hypokalemia - in setting of lasix. Resolved.  P:  - Goal to keep even. - Lasix at current 40mg  IV qday. - KCl per tube x 1 - Pending AM BMET  GASTROINTESTINAL  Lab 05/02/12 0425 04/30/12 0500  AST 27 17  ALT 37 23  ALKPHOS 72 61  BILITOT 0.8 0.7  PROT 6.0 5.6*  ALBUMIN 2.3* 2.0*   A:  1) GERD 2) Diarrhea - resolving. C. Diff negative. 3) Colonic Ileus - has had BM x 2 4) Paraesophageal nodes - need FU CT in 3-4 mnths P: - Continue TF  - Pepcid - PEG delayed with lack of contrast, note colonic ileus - Minimise narcotics, reglan 10 IV q 8h x 6 doses  HEMATOLOGIC  Lab 05/04/12 0430 05/03/12 0530 05/02/12 0425 04/29/12 1328 04/28/12 0157  WBC 13.7* 13.0* 13.3* -- --  HGB 15.1 15.7 14.7 -- --  HCT 46.8 48.1 46.2 -- --  PLT 211 216 193 -- --  APTT -- -- -- -- --  INR -- -- -- 0.96 1.04    A:  1) Polycythemia- Hgb stable.  2) Leukocytosis -  Increased this AM, likely in setting of high dose steroids and hemoconcetration - no fever. Prior DC of left IJ on 1/13 (cath tip infection-staph/coag neg). 3) Anticoagulation in setting of atrial fibrillation - CHADS score of 3. Heparin and coumadin held prior to trach 1/22 and coags wnl. 4) Bilateral PE 5) Bilateral DVT - pending IVC filter placement  P:  - IV heparin for VTE's - No coumadin for now. - SCD - IVC filter placement planned for today.  INFECTIOUS  Lab 04/29/12 0446 04/28/12 0455  04/27/12 1230  LATICACIDVEN -- -- --  PROCALCITON 0.17 0.15 0.14   1) Flu pneumonia - Flu A and metapneumovirus >finished full 10-day course of Tamiflu, droplet isolation d/c 1/18 2) Aspirated at time of intubation, pneumonitis vs PNA>finished flu course of Rocephin. Aspirated again during reintubation.  3) Nosocomial infection -  Left IJ (in 8  days) was dc'ed on 1/13 and catheter tip cultures>staph/coag neg . Was resumed Vanc on 1/13 -->  4) Diarrhea - C.diff neg. 5) Leukocytosis - likely 2/2 high dose steroids. Remains afebrile, Ceftaz added 1/18 w/ sputum cx negative. Procalcitonin 0.14 --> 0.15.   P:  - Continue Zosyn day 6 of 7 for aspiration PNA - Continue to monitor for s/s of additional infection.  ENDOCRINE  Lab 05/04/12 0403 05/03/12 2350 05/03/12 1953 05/03/12 1607 05/03/12 1218  GLUCAP 123* 126* 135* 176* 217*   Lab Results  Component Value Date   HGBA1C 7.1* 04/24/2012   TSH 1.494 04/25/2012    A:  1) Diabetes mellitus, type 2 (A1c 7.1) - possible iatrogenic secondary to prolonged steroid use. 2) On steroids - CBGs at goal. Lantus off. P:  - Cont ICU Hyperglycemic protocol - escalate scale. - CBG monitoring q4h while NPO. - Continue steroids.  NEUROLOGIC / PSYCHIATRIC   A:  1) Acute encephalopathy - Resolved. Likely secondary to hypoxia / hypercarbia and possible infectious process (CAP?). >returned 1/18 >requiring intubation  2) Delerium - persistent, controlled on propofol and fentanyl, requiring high doses, inhibiting weaning.   P:  - Continue propofol + fentanyl, wean as able. Goal to dc propofol - Continue methadone and seroquel -titrate - Wean fentanyl - Delirium, avoid benzos - Does not qualify for LTAC, SNF/trach beds requested (possibly out of state) - PT/ OT consult. - Ativan 1 q12h  CLINICAL SUMMARY: 62 yo M with COPD and 3ppd smoking admitted 04/12/2012 with acute respiratory failure 2/2 influenza A and aspiration PNA, required intubated 04/13/11 for  airway protection and aspirated during intubation. Extubated 1/15 but reintubated emergently 1/18 for severe delerium & resp distress, purulent secretions noted. Trach 1/22, still requiring heavy sedation. 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 Titrate oral meds to dc IV sedation. Stay in ICU with risk rebleed. Plan for IVC Filter placement 1/27.   Signed: Johnette Abraham, Roma Schanz, Internal Medicine Resident Pager: [phone] (7AM-5PM)  Care during the described time interval was provided by me and/or other providers on the critical care team.  I have reviewed this patient's available data, including medical history, events of note, physical examination and test results as part of my evaluation  CC time x  32 m  Ocia Simek V.  05/04/2012, 8:02 AM

## 2012-05-05 ENCOUNTER — Inpatient Hospital Stay (HOSPITAL_COMMUNITY): Payer: Medicaid Other

## 2012-05-05 LAB — CBC
HCT: 47.4 % (ref 39.0–52.0)
Hemoglobin: 15.1 g/dL (ref 13.0–17.0)
MCH: 29.6 pg (ref 26.0–34.0)
MCHC: 31.9 g/dL (ref 30.0–36.0)
RDW: 16.6 % — ABNORMAL HIGH (ref 11.5–15.5)

## 2012-05-05 LAB — COMPREHENSIVE METABOLIC PANEL
ALT: 33 U/L (ref 0–53)
Alkaline Phosphatase: 72 U/L (ref 39–117)
BUN: 27 mg/dL — ABNORMAL HIGH (ref 6–23)
CO2: 30 mEq/L (ref 19–32)
Chloride: 100 mEq/L (ref 96–112)
GFR calc Af Amer: >90 mL/min (ref >90)
GFR calc non Af Amer: >90 mL/min (ref >90)
Glucose, Bld: 110 mg/dL — ABNORMAL HIGH (ref 70–99)
Potassium: 3.2 mEq/L — ABNORMAL LOW (ref 3.5–5.1)
Sodium: 141 mEq/L (ref 135–145)
Total Bilirubin: 0.9 mg/dL (ref 0.3–1.2)

## 2012-05-05 LAB — MAGNESIUM: Magnesium: 1.8 mg/dL (ref 1.5–2.5)

## 2012-05-05 LAB — GLUCOSE, CAPILLARY
Glucose-Capillary: 118 mg/dL — ABNORMAL HIGH (ref 70–99)
Glucose-Capillary: 129 mg/dL — ABNORMAL HIGH (ref 70–99)
Glucose-Capillary: 220 mg/dL — ABNORMAL HIGH (ref 70–99)

## 2012-05-05 LAB — HEPARIN LEVEL (UNFRACTIONATED): Heparin Unfractionated: 0.3 IU/mL (ref 0.30–0.70)

## 2012-05-05 MED ORDER — SODIUM CHLORIDE 0.9 % IJ SOLN
10.0000 mL | Freq: Two times a day (BID) | INTRAMUSCULAR | Status: DC
  Administered 2012-05-05: 30 mL
  Administered 2012-05-06 – 2012-05-08 (×5): 10 mL
  Administered 2012-05-09: 20 mL
  Administered 2012-05-10: 10 mL
  Administered 2012-05-11: 20 mL
  Administered 2012-05-12 – 2012-05-13 (×3): 10 mL
  Administered 2012-05-13: 30 mL
  Administered 2012-05-14: 10 mL
  Administered 2012-05-14: 20 mL
  Administered 2012-05-15 – 2012-05-17 (×5): 10 mL
  Administered 2012-05-17 – 2012-05-18 (×2): 20 mL
  Administered 2012-05-18: 10 mL
  Administered 2012-05-19: 30 mL
  Administered 2012-05-19 – 2012-05-22 (×6): 10 mL

## 2012-05-05 MED ORDER — MIDAZOLAM HCL 2 MG/2ML IJ SOLN
INTRAMUSCULAR | Status: AC
  Filled 2012-05-05: qty 2

## 2012-05-05 MED ORDER — SODIUM CHLORIDE 0.9 % IV SOLN
25.0000 ug/h | INTRAVENOUS | Status: DC
  Administered 2012-05-06: 200 ug/h via INTRAVENOUS
  Filled 2012-05-05 (×2): qty 50

## 2012-05-05 MED ORDER — AMIODARONE PEDIATRIC ORAL SUSPENSION 5 MG/ML
200.0000 mg | Freq: Every day | ORAL | Status: DC
  Administered 2012-05-05: 200 mg via ORAL
  Filled 2012-05-05 (×5): qty 40

## 2012-05-05 MED ORDER — POTASSIUM CHLORIDE 20 MEQ/15ML (10%) PO LIQD
40.0000 meq | ORAL | Status: AC
  Administered 2012-05-05 (×2): 40 meq
  Filled 2012-05-05: qty 30

## 2012-05-05 MED ORDER — FENTANYL BOLUS VIA INFUSION
25.0000 ug | Freq: Four times a day (QID) | INTRAVENOUS | Status: DC | PRN
  Filled 2012-05-05: qty 100

## 2012-05-05 MED ORDER — PREDNISONE 5 MG/ML PO CONC
40.0000 mg | Freq: Every day | ORAL | Status: DC
  Administered 2012-05-06: 40 mg
  Filled 2012-05-05 (×2): qty 8

## 2012-05-05 MED ORDER — SODIUM CHLORIDE 0.9 % IJ SOLN
10.0000 mL | INTRAMUSCULAR | Status: DC | PRN
Start: 2012-05-05 — End: 2012-05-06

## 2012-05-05 MED ORDER — MIDAZOLAM HCL 2 MG/2ML IJ SOLN
2.0000 mg | Freq: Once | INTRAMUSCULAR | Status: AC
  Administered 2012-05-05: 2 mg via INTRAVENOUS

## 2012-05-05 MED ORDER — HEPARIN (PORCINE) IN NACL 100-0.45 UNIT/ML-% IJ SOLN
2450.0000 [IU]/h | INTRAMUSCULAR | Status: DC
  Administered 2012-05-05 – 2012-05-08 (×6): 2450 [IU]/h via INTRAVENOUS
  Filled 2012-05-05 (×10): qty 250

## 2012-05-05 NOTE — Progress Notes (Signed)
ANTICOAGULATION CONSULT NOTE  Pharmacy Consult for heparin Indication: pulmonary embolus and DVT  Allergies  Allergen Reactions  . Protamine Anaphylaxis    Patient Measurements: Height: 5\' 10"  (177.8 cm) Weight: 205 lb 11 oz (93.3 kg) IBW/kg (Calculated) : 73  Heparin Dosing Weight: 92 kg  Vital Signs: Temp: 99.6 F (37.6 C) (01/28 0840) Temp src: Oral (01/28 0840) BP: 97/67 mmHg (01/28 0840) Pulse Rate: 82  (01/28 0800)  Labs:  Basename 05/05/12 0420 05/04/12 1517 05/04/12 0430 05/03/12 1200 05/03/12 0535 05/03/12 0530  HGB 15.1 -- 15.1 -- -- --  HCT 47.4 -- 46.8 -- -- 48.1  PLT 206 -- 211 -- -- 216  APTT -- -- -- -- -- --  LABPROT -- 12.5 -- -- -- --  INR -- 0.94 -- -- -- --  HEPARINUNFRC 0.30 -- 0.67 0.47 -- --  CREATININE 0.62 0.60 -- -- 0.59 --  CKTOTAL -- -- -- -- -- --  CKMB -- -- -- -- -- --  TROPONINI -- -- -- -- -- --    Estimated Creatinine Clearance: 108.4 ml/min (by C-G formula based on Cr of 0.62).  Assessment: 62 yo male with h/o atrial fibrillation, now with new DVT and PE, for heparin. Heparin currently therapuetic but at upper end of goal and trending up, CBC stable, no recent bleeding. Post IVC filter yesterday.  Goal of Therapy:  Heparin level 0.3-0.7 units/ml Monitor platelets by anticoagulation protocol: Yes   Plan:  Increase to 2450 units/hr to keep within therapeutic range. Follow up with morning heparin level.  Sheppard Coil, Pharm.D. Clinical Pharmacist  05/05/2012 9:08 AM

## 2012-05-05 NOTE — Progress Notes (Signed)
NUTRITION FOLLOW UP  Intervention:  1. RD will d/c Pro-stat as other TF orders have been d/c'd 2. RD will follow for ability to restart TF, if unable to restart r/t ileus recommend initiation of TPN  Nutrition Dx:   Inadequate oral intake, ongoing  Goal:   Intake to meet >/=90% estimated nutrition needs. Unmet at this time.    Monitor:   Vent status, PEG placement, weight trends, I/O's  Assessment:   Pt with trach, remains on vent support. PEG still unable to be placed as pt now requiring PICC placement today. TF has been off since prior to IVC Filter placement yesterday.  RN reports pt has bowel sounds but are hyperactive, distended abdomin and very small amount of stool. Last KUB was on 1/26, new ileus seen at that time, no obstruction.     Height: Ht Readings from Last 1 Encounters:  09/19/10 5' 10.5" (1.791 m)    Weight Status:   Wt Readings from Last 1 Encounters:  05/05/12 205 lb 11 oz (93.3 kg)  Trending down Body mass index is 29.51 kg/(m^2).   Patient is currently on ventilator support.  MV: has been averaging about 9 Temp:Temp (24hrs), Avg:98.7 F (37.1 C), Min:98 F (36.7 C), Max:99.6 F (37.6 C)  Propofol: 11.4 ml/hr providing  301 kcal from lipids per day   Re-estimated needs:  Kcal: 2022 Protein: >/=146 gm  Fluid: >/= 1.2 L daily   Skin: moisture associated wound to inner thigh   Diet Order: NPO   Intake/Output Summary (Last 24 hours) at 05/05/12 1019 Last data filed at 05/05/12 1000  Gross per 24 hour  Intake 2604.92 ml  Output   1530 ml  Net 1074.92 ml    Last BM: 1/28   Labs:   Lab 05/05/12 0420 05/04/12 1517 05/03/12 0535 05/02/12 0425  NA 141 141 140 --  K 3.2* 3.4* 2.6* --  CL 100 99 97 --  CO2 30 35* 38* --  BUN 27* 24* 28* --  CREATININE 0.62 0.60 0.59 --  CALCIUM 8.7 8.5 8.7 --  MG 1.8 1.9 -- 2.3  PHOS 2.6 4.1 -- 2.9  GLUCOSE 110* 170* 129* --    CBG (last 3)   Basename 05/05/12 0851 05/05/12 0331 05/04/12 2329    GLUCAP 129* 118* 114*    Scheduled Meds:    . antiseptic oral rinse  15 mL Mouth Rinse QID  . chlorhexidine  15 mL Mouth Rinse BID  . diltiazem  60 mg Oral Q6H  . famotidine (PEPCID) IV  20 mg Intravenous Q12H  . feeding supplement  60 mL Oral TID  . furosemide  40 mg Per Tube Daily  . insulin aspart  0-15 Units Subcutaneous Q4H  . ipratropium  0.5 mg Nebulization Q6H  . levalbuterol  0.63 mg Nebulization Q6H  . LORazepam  1 mg Oral BID  . methadone  5 mg Oral Q6H  . multivitamin  5 mL Per Tube Daily  . piperacillin-tazobactam (ZOSYN)  IV  3.375 g Intravenous Q8H  . predniSONE  40 mg Per Tube Q breakfast  . QUEtiapine  100 mg Oral BID  . sodium chloride  500 mL Intravenous Once    Continuous Infusions:    . sodium chloride 250 mL (05/03/12 0449)  . sodium chloride 10 mL/hr at 05/05/12 0540  . sodium chloride 10 mL/hr at 05/05/12 0541  . amiodarone (NEXTERONE PREMIX) 360 mg/200 mL dextrose 30 mg/hr (05/05/12 0700)  . fentaNYL infusion INTRAVENOUS 100 mcg/hr (05/05/12  0800)  . heparin 2,450 Units/hr (05/05/12 0956)  . propofol 20 mcg/kg/min (05/05/12 0800)    Clarene Duke RD, LDN Pager [phone] After Hours pager [phone]

## 2012-05-05 NOTE — Progress Notes (Signed)
Physical Therapy Treatment Patient Details Name: Cody [name] MRN: [id number] DOB: [birth year] Today's Date: 05/05/2012 Time: 4098-1191 PT Time Calculation (min): 32 min  PT Assessment / Plan / Recommendation Comments on Treatment Session  Pt admitted with SOB and respiratory failure due to flu and PNA with VDRF1/5-1/15, 1/18-1/21 with trach and bronchoscopy 1/22. Continues on vent today PS/CPAP mode, SaO2 upper 90s but HR limited our session today elevating to 130s-140s with just rolling in the bed. Continued difficulty following commands today, sedation? Will continue to progress as pt able. May try bed/chair position next session to see if pt could tolerate?    Follow Up Recommendations  SNF (per CSW insurance will not approve LTACH)     Does the patient have the potential to tolerate intense rehabilitation     Barriers to Discharge        Equipment Recommendations   (determine based on progression)    Recommendations for Other Services OT consult  Frequency Min 2X/week   Plan Discharge plan remains appropriate;Frequency remains appropriate    Precautions / Restrictions Precautions Precautions: Fall Precaution Comments: trach, OG tub, bil. mittens, vent Restrictions Weight Bearing Restrictions: No   Pertinent Vitals/Pain Pt unable to verbalize pain but did grimace when rolling in bed, RN with [place]; PS/CPAP vent mode today, FiO2 40% with SaO2 95-98%, HR elevated from 100 initially to 130s-140s during activity and as pt became aggitated    Mobility  Bed Mobility Bed Mobility: Rolling Right;Rolling Left;Scooting to [facility] Rolling Right: 1: +2 Total assist Rolling Right: Patient Percentage: 30% Rolling Left: 1: +2 Total assist Rolling Left: Patient Percentage: 30% Scooting to HOB: 1: +2 Total assist Scooting to [facility]: Patient Percentage: 0% Details for Bed Mobility Assistance: pt engaging trunk for rolling with facilitation for knee flexion and initiation for rotation with max  verbal cues for attention; upon rolling pt needing modA to maintain legs on the bed (tends to extend lower extremties and at times arches his back, could be a painful response as RN was performing pericare); supine in bed with HOB elevated approx 45 degress he was able to readjust himself for more upright position  Transfers Transfers: Not assessed      PT Goals Acute Rehab PT Goals PT Goal: Rolling Supine to Right Side - Progress: Progressing toward goal PT Goal: Rolling Supine to Left Side - Progress: Progressing toward goal Additional Goals Additional Goal #1: Pt will follow basic 1 step commands (i.e. squeeze therapists hand, turn to look at therapist) at least 50% of time in a 20 minute therapy session.  PT Goal: Additional Goal #1 - Progress: Goal set today  Visit Information  Last PT Received On: 05/05/12 Assistance Needed: +2    Subjective Data  Subjective: Nonverbal, trach with ventilator, opens eyes to his name Patient Stated Goal: unable   Cognition  Overall Cognitive Status: Impaired Area of Impairment: Attention;Following commands Difficult to assess due to: Tracheostomy Arousal/Alertness: Lethargic Orientation Level:  (unable to assess) Behavior During Session: Lethargic Current Attention Level: Focused Attention - Other Comments: pt responds to his name being called x2 but quickly falls back asleep, does not appear to see the therapist or try to find the therapist in the room when his name was called, did not see any visual tracking, eyes stay focus ahead of him Following Commands: Follows one step commands inconsistently;Follows one step commands with increased time Cognition - Other Comments: pt not following basic commands today (squeeze hand, turn head to look at therapist) but  did appear to be trying to roll on command (whether this was purposeful or not this is difficult to tell given attention level); upper extremities tend to withdraw into elbow flexion/wrist  flexion and shoulder abd with increased tone needing verbal and tactile cues to relax;    Balance     End of Session PT - End of Session Activity Tolerance: Treatment limited secondary to medical complications (Comment) (HR elevated) Patient left: in bed;Other (comment) (mittens on) Nurse Communication: Mobility status   GP     [facility] HELEN 05/05/2012, 3:15 PM

## 2012-05-05 NOTE — Progress Notes (Signed)
Clinical Social Work  CSW made phone calls to follow up on referral to [facility], [facility], [facility] and [facility]. CSW left messages with admission's coordinators to inquire if any further information was needed for referral. CSW will continue to follow.  CSW went to room and met with patient and girlfriend [name]). Patient sedated and did not participate in assessment. Girlfriend spoke about stressors of hospitalization. 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 Girlfriend now looking for a job since patient is in the hospital. Patient has custody of three granddtrs ages 17,81, and 62 years old. Girlfriend caring for children and reports she contact [facility] DSS who is agreeable to girlfriend being primary caretaker while patient is admitted.  CSW updated girlfriend on vent SNF search. Girlfriend and son have been working together to complete [employer] application. Girlfriend is having a difficult time getting bank statements from patient's bank because she is not listed on information. Son is assisting with this matter. CSW stressed the importance of completing application as soon as possible.  CSW provided girlfriend with CSW contact information and will continue to follow to assist with dc planning and support for family during this time.  [place], [place] [phone]

## 2012-05-05 NOTE — Progress Notes (Signed)
The patient tachycardic, agitated and anxious; about to receive PIC line   We will restart temporarily the Fentanyl drip and give a one dose Versed for anxiety; we will titrate fentanyl off once settled post procedure

## 2012-05-05 NOTE — Progress Notes (Signed)
eLink Physician-Brief Progress Note Patient Name: Cody [name] DOB: [birth year] MRN: [id number]  Date of Service  05/05/2012   HPI/Events of Note  Hypokalemia   eICU Interventions  Potassium replaced   Intervention Category Intermediate Interventions: Electrolyte abnormality - evaluation and management  Cody [name] 05/05/2012, 5:12 AM

## 2012-05-05 NOTE — Progress Notes (Signed)
Peripherally Inserted Central Catheter/Midline Placement  The IV Nurse has discussed with the patient and/or persons authorized to consent for the patient, the purpose of this procedure and the potential benefits and risks involved with this procedure.  The benefits include less needle sticks, lab draws from the catheter and patient may be discharged home with the catheter.  Risks include, but not limited to, infection, bleeding, blood clot (thrombus formation), and puncture of an artery; nerve damage and irregular heat beat.  Alternatives to this procedure were also discussed.  PICC/Midline Placement Documentation    Phone consent from son obtained by Stacie Glaze RN    Cody [name] 05/05/2012, 4:41 PM

## 2012-05-06 ENCOUNTER — Inpatient Hospital Stay (HOSPITAL_COMMUNITY): Payer: Medicaid Other

## 2012-05-06 LAB — CBC
MCH: 30.1 pg (ref 26.0–34.0)
MCHC: 32.1 g/dL (ref 30.0–36.0)
MCV: 93.9 fL (ref 78.0–100.0)
Platelets: 226 10*3/uL (ref 150–400)
RDW: 16.6 % — ABNORMAL HIGH (ref 11.5–15.5)

## 2012-05-06 LAB — GLUCOSE, CAPILLARY
Glucose-Capillary: 114 mg/dL — ABNORMAL HIGH (ref 70–99)
Glucose-Capillary: 149 mg/dL — ABNORMAL HIGH (ref 70–99)

## 2012-05-06 LAB — BASIC METABOLIC PANEL
CO2: 33 mEq/L — ABNORMAL HIGH (ref 19–32)
Chloride: 100 mEq/L (ref 96–112)
GFR calc Af Amer: >90 mL/min (ref >90)
Sodium: 143 mEq/L (ref 135–145)

## 2012-05-06 MED ORDER — HALOPERIDOL LACTATE 5 MG/ML IJ SOLN
10.0000 mg | INTRAMUSCULAR | Status: DC | PRN
  Administered 2012-05-07 (×2): 10 mg via INTRAVENOUS
  Filled 2012-05-06: qty 2
  Filled 2012-05-06: qty 1

## 2012-05-06 MED ORDER — SODIUM CHLORIDE 0.9 % IJ SOLN: 10.0000 mL | INTRAMUSCULAR | Status: DC | PRN

## 2012-05-06 MED ORDER — HALOPERIDOL LACTATE 5 MG/ML IJ SOLN
5.0000 mg | Freq: Once | INTRAMUSCULAR | Status: AC
  Administered 2012-05-06: 5 mg via INTRAVENOUS

## 2012-05-06 MED ORDER — DILTIAZEM HCL 25 MG/5ML IV SOLN
10.0000 mg | Freq: Once | INTRAVENOUS | Status: AC
  Administered 2012-05-06: 10 mg via INTRAVENOUS
  Filled 2012-05-06: qty 5

## 2012-05-06 MED ORDER — HALOPERIDOL LACTATE 5 MG/ML IJ SOLN
5.0000 mg | Freq: Once | INTRAMUSCULAR | Status: AC
  Administered 2012-05-06: 5 mg via INTRAVENOUS
  Filled 2012-05-06: qty 1

## 2012-05-06 MED ORDER — HALOPERIDOL LACTATE 5 MG/ML IJ SOLN
5.0000 mg | Freq: Three times a day (TID) | INTRAMUSCULAR | Status: DC | PRN
  Filled 2012-05-06: qty 1

## 2012-05-06 MED ORDER — AMIODARONE PEDIATRIC ORAL SUSPENSION 5 MG/ML: 400.0000 mg | Freq: Every day | ORAL | Status: DC

## 2012-05-06 MED ORDER — FAMOTIDINE 40 MG/5ML PO SUSR
20.0000 mg | Freq: Two times a day (BID) | ORAL | Status: DC
  Administered 2012-05-06: 20 mg via ORAL
  Filled 2012-05-06 (×4): qty 2.5

## 2012-05-06 MED ORDER — DILTIAZEM HCL 100 MG IV SOLR
5.0000 mg/h | INTRAVENOUS | Status: AC
  Administered 2012-05-06: 5 mg/h via INTRAVENOUS
  Administered 2012-05-07 (×2): 15 mg/h via INTRAVENOUS
  Administered 2012-05-08: 20 mg/h via INTRAVENOUS
  Administered 2012-05-08: 15 mg/h via INTRAVENOUS
  Administered 2012-05-08 (×2): 20 mg/h via INTRAVENOUS
  Administered 2012-05-09 – 2012-05-10 (×2): 10 mg/h via INTRAVENOUS
  Administered 2012-05-11: 15 mg/h via INTRAVENOUS
  Administered 2012-05-11: 20 mg/h via INTRAVENOUS
  Filled 2012-05-06 (×3): qty 100

## 2012-05-06 MED ORDER — POTASSIUM CHLORIDE 20 MEQ/15ML (10%) PO LIQD
ORAL | Status: AC
  Administered 2012-05-06: 40 meq
  Filled 2012-05-06: qty 30

## 2012-05-06 MED ORDER — DIPHENHYDRAMINE HCL 12.5 MG/5ML PO LIQD
25.0000 mg | Freq: Three times a day (TID) | ORAL | Status: DC
  Administered 2012-05-06 (×2): 25 mg
  Filled 2012-05-06 (×6): qty 10

## 2012-05-06 MED ORDER — POTASSIUM CHLORIDE 20 MEQ/15ML (10%) PO LIQD
40.0000 meq | ORAL | Status: AC
  Administered 2012-05-06: 40 meq
  Filled 2012-05-06 (×2): qty 30

## 2012-05-06 MED ORDER — METHYLPREDNISOLONE SODIUM SUCC 40 MG IJ SOLR
40.0000 mg | Freq: Two times a day (BID) | INTRAMUSCULAR | Status: DC
  Administered 2012-05-06 – 2012-05-07 (×2): 40 mg via INTRAVENOUS
  Filled 2012-05-06 (×5): qty 1

## 2012-05-06 MED ORDER — FENTANYL CITRATE 0.05 MG/ML IJ SOLN
50.0000 ug | INTRAMUSCULAR | Status: DC | PRN
  Administered 2012-05-07 – 2012-05-11 (×10): 100 ug via INTRAVENOUS
  Filled 2012-05-06 (×11): qty 2

## 2012-05-06 MED ORDER — MIDAZOLAM HCL 2 MG/2ML IJ SOLN
2.0000 mg | INTRAMUSCULAR | Status: DC | PRN
  Administered 2012-05-06 – 2012-05-07 (×4): 4 mg via INTRAVENOUS
  Administered 2012-05-07: 2 mg via INTRAVENOUS
  Administered 2012-05-07 – 2012-05-12 (×21): 4 mg via INTRAVENOUS
  Administered 2012-05-14 – 2012-05-17 (×2): 2 mg via INTRAVENOUS
  Filled 2012-05-06 (×6): qty 4
  Filled 2012-05-06: qty 2
  Filled 2012-05-06 (×11): qty 4
  Filled 2012-05-06: qty 2
  Filled 2012-05-06: qty 4
  Filled 2012-05-06 (×2): qty 2
  Filled 2012-05-06 (×8): qty 4
  Filled 2012-05-06: qty 2
  Filled 2012-05-06: qty 4

## 2012-05-06 MED ORDER — AMIODARONE HCL 200 MG PO TABS
400.0000 mg | ORAL_TABLET | Freq: Every day | ORAL | Status: DC
  Administered 2012-05-06 – 2012-05-24 (×13): 400 mg via ORAL
  Filled 2012-05-06 (×19): qty 2

## 2012-05-06 MED ORDER — METHADONE HCL 5 MG PO TABS: 10.0000 mg | ORAL_TABLET | Freq: Every day | ORAL | Status: DC

## 2012-05-06 MED ORDER — QUETIAPINE FUMARATE 200 MG PO TABS
200.0000 mg | ORAL_TABLET | Freq: Two times a day (BID) | ORAL | Status: DC
Start: 2012-05-06 — End: 2012-05-07
  Administered 2012-05-06: 200 mg via ORAL
  Filled 2012-05-06 (×4): qty 1

## 2012-05-06 NOTE — Progress Notes (Signed)
Clinical Social Work  CSW spoke with [facility] and [facility] regarding referral for SNF. Both facilities requesting additional information. CSW faxed information to facilities. [facility] is requesting a letter of guarantee for patient to admit to their facility. [facility] reports that they will not have any beds available until next week. CSW will continue to follow to assist with dc planning.  [place], [place] [phone]

## 2012-05-06 NOTE — Progress Notes (Signed)
eLink Physician-Brief Progress Note Patient Name: Cody [name] DOB: 1950/09/01 MRN: [id number]  Date of Service  05/06/2012   HPI/Events of Note  Patient back in AF/RVR with rate in the 130s to 140s.  HD stable.  Had been on oral Cardizem but NGT pulled out by patient.  eICU Interventions  Plan: D/C po Cardizem Bolus 10 mg dilt Dilt gtt   Intervention Category Intermediate Interventions: Arrhythmia - evaluation and management  DETERDING,ELIZABETH 05/06/2012, 11:47 PM

## 2012-05-06 NOTE — Progress Notes (Signed)
PULMONARY  / CRITICAL CARE MEDICINE  Name: Cody [name] MRN: [id number] DOB: 10-Jun-1950    LOS: 25  REFERRING MD:   EDP - Dr. Preston Fleeting  CHIEF COMPLAINT:  Shortness of breath  BRIEF PATIENT DESCRIPTION: 87 yoM with COPD (3ppd smoking) admitted 1/5 with VDRF 2/2 influenza A and asp PNA, intubated 04/13/11 - 1/15 for AW protection, aspirated during intubation. Reintubated emergently 1/18 for severe delerium & resp distress, purulent secretions noted. Trach 1/22. New PE, DVT BL, s/p IVF filter 1/27. On hep gtt.   LINES / TUBES: ETT 1/5 >>1/15 , 1/18 >> self extubated 1/21 >>> reintubated 1/21 L IJ 1/5 >> 1/13 R IJ 1/13 >> 1/16 RIJ 1/18 >> 1/28 R A-line 1/5 >>1/8 pulled out by pt Trach 1/22 IVC Filter 1/27 >> PICC 1/28 >>  CULTURES: Blood culture 1/5>> Negative Resp culture 1/6>> H. influenzae  Resp viral panel 1/6>> Influenza A and Metapneumovirus  Sputum Leigonella Culture 1/6>> Negative Bronch Sputum 1/6>> H. influenzae  Flu 1/5>> Negative Blood culture 01/13 >> Negative Catheter tip culture 1/13 >>Staph -coag neg-70 colonies(vanc sens)  C.diff PCR 1/14 >> Negative Resp culture 1/18 >>> Negative  ANTIBIOTICS: Zosyn 1/5>>1/9 - restarted 1/22 >>1/28 Azithro 1/5>>1/8  Vanc 1/6>>>1/8 - restarted 1/13 >>1/25 Tamiflu 1/6>>>1/16  Rocephin 1/9>>1/15 Ceftazidine 1/18 >>1/22   SIGNIFICANT EVENTS:  1/5 Intubated in ED  1/5 Shock , levophed  1/6 ECHO - 55%, mod dil rv, PA pressures>>> not measured b/c no TR jet  1/6 Bronched- diffuse pus all lobes  1/7 increasing pCO2, increased RR on vent  1/8 Changed sedation to Precedex, more agitated than with versed  1/9 Changed sedation back to Versed, agitation greatly improved  1/10 scheduled haldol  1/12 Restarting precedex gtt due to persistent agitation 1/13 Febrile to 102.6 overnight. ? Catheter infection? DC'ed L IJ, placed new R IJ. 1/14 No fevers overnight, agitation controlled on propofol (added 1/13), more interactive. 1/15 Had  foul smelling stools overnight, therefore, C.diff PCR was sent by RN. 1/15 EXTUBATED, developed A.fib with RVR overnight. 1/16 Persistent episodes of desaturation., afib>started on cardizem drip  1/17 increased agitation  1/18 reintubated  1/20 Persistent A. Fib this morning on dilt drip.  Rates controlled between 99-125 1/21 Self extubated, given a chance and continued to be in respiratory distress so reintubated.  1/22 Tracheostomy, complicated by bleeding, angio edema from protamine 1/23 Continued agitation. 1/23 Neg acute ct head 1/24 PEG placement planned --> delayed bc of ileus 1/25 CTA showing PE BL --> started on heparin gtt. 1/26 BL LE DVT with mobile thrombus 1/27 IVC Filter placement. 1/28 PICC Line 1/29 Persistent ileus per abdominal x-ray. Attempting to limit narcotics.   LEVEL OF CARE:  ICU PRIMARY SERVICE:  PCCM CONSULTANTS:  None CODE STATUS: Full DIET:  Tube feeds DVT Px:  Heparin gtt GI Px:  Protonix   SUBJECTIVE / INTERVAL HISTORY:  Patient indicates no pain, nausea, vomiting, shortness of breath  Interval Events: - Has been very well overnight, has pulled out multiple OG tubes, which remain now. - Reverted back to A. fib overnight with rates in 130s. - Maxed out on Cardizem drip. - Possibly more agitated after Ativan.  VITAL SIGNS: Temp:  [98.1 F (36.7 C)-99 F (37.2 C)] 98.6 F (37 C) (01/30 0400) Pulse Rate:  [44-136] 110  (01/30 0700) Resp:  [13-32] 17  (01/30 0700) BP: (100-166)/(69-129) 121/77 mmHg (01/30 0700) SpO2:  [88 %-99 %] 88 % (01/30 0700) FiO2 (%):  [40 %-60 %] 40 % (  01/30 0432) Weight:  [205 lb 0.4 oz (93 kg)] 205 lb 0.4 oz (93 kg) (01/30 0451) HEMODYNAMICS: VENTILATOR SETTINGS: Vent Mode:  [-] CPAP;PSV FiO2 (%):  [40 %-60 %] 40 % PEEP:  [5 cmH20] 5 cmH20 Pressure Support:  [5 cmH20] 5 cmH20 INTAKE / OUTPUT:  Intake/Output Summary (Last 24 hours) at 05/07/12 0800 Last data filed at 05/07/12 0700  Gross per 24 hour  Intake  1145.25 ml  Output   1410 ml  Net -264.75 ml    PHYSICAL EXAMINATION: General: Tracheostomy in place, on ventilator, opens eyes to command and tracks around the room. Follows commands much more consistently.  Head: Normocephalic, Trach in place, no bleeding.  Lungs:  Diminished BS, course rhonchi, improving.  Heart: Irregularly irregular rhythm, regular rate.  Abdomen:  BS normoactive. Soft, Nondistended, non-tender.  No masses or organomegaly.  Extremities: No pretibial edema.     IMAGING:  1/30 - NG tube coiled overlying the proximal stomach.gaseous distention again noted.   1/25 - CTA chest - 1. Pulmonary embolus within segmental branches to the right upper lobe, left upper lobe and left lower lobe, extending distally. 2. Bilateral lower lobe airspace opacification is thought to reflect atelectasis, with trace bilateral pleural effusions. No definite evidence for aspiration. 3. Mild nonspecific left upper lobe opacities may reflect atelectasis.  1/26 - LE Dopplers - 1. Acute DVT right saphenofemoral junction, common femoral vein. 2. Acute DVT left saphenofemoral junction, left femoral vein, and mobile thrombus in the left common femoral vein.   DIAGNOSES: Principal Problem:  *Acute respiratory failure with hypercapnia Active Problems:  HYPERLIPIDEMIA  TOBACCO USE  HYPERTENSION  GERD  COPD (chronic obstructive pulmonary disease)  Acute encephalopathy  Acute kidney injury  Atrial fibrillation  PE (pulmonary embolism)  ASSESSMENT / PLAN:  PULMONARY A: 1) Acute hypercapnic respiratory failure/ARDS - secondary to flu, COPD exacerbation. Trach 1/22. 2) Aspiration - at time of intubation, finished course of treatment  3) Pneumonitis vs. CAP - b/l interstitial opacities on cxr, slightly increased at left, likely atelectasis. Completed 8/8 days rocephin , on tapering steroids  4) Pulmonary edema - improved. 4) Non-oxygen dependent COPD - h/o chronic tobacco abuse hx 3 pack per  day 20+ years.  5) Bloody secretions from aspiration - Pulm /renal syndrome ruled out. 6) Upper airway severe angioedema & rash  from protamine  7) Likley small hematoma on rt apex from neck oozing  P:  -  ATC as tolerated - Duonebs q6h + q2h PRN. - Continue steroids for rash - solumedrol 40 q 24h  -PM valve today  CARDIOVASCULAR  A:  1) New onset Afib with RVR - no longer rate controlled. Unable to take oral amiodarone. On cardizem, amio. CE neg x 3. On heparin gtt. 2) Hypotension - mild. With hx of HTN. 3) Anaphylaxis to protamine - protamine given in setting of trach placement, resultant anaphylactic reaction - resolved.  P:  - Goal HR < 120, cardizem gtt. -Resume amio PO when able  RENAL  Lab 05/07/12 0440 05/06/12 0445 05/05/12 0420 05/04/12 1517  NA 142 143 -- --  K 3.6 2.9* -- --  CL 100 100 -- --  CO2 30 33* -- --  BUN 19 19 -- --  CREATININE 0.47* 0.54 -- --  GLUCOSE 132* 107* -- --  MG -- -- 1.8 1.9  PHOS 2.6 -- 2.6 --   A:  1) Acute Kidney injury - Resolved. Previously likely pre-renal in setting of volume depletion. Previously urinary sediment  clogging foley, improved. Continue to monitor in setting of lasix use. 2) Hypernatremia - resolved  3) Hypokalemia - Resolved after d/c Lasix. Previously in setting of lasix.  P:  - Goal to keep even.  GASTROINTESTINAL  Lab 05/05/12 0420 05/04/12 1517 05/02/12 0425  AST 24 18 27   ALT 33 39 37  ALKPHOS 72 77 72  BILITOT 0.9 0.8 0.8  PROT 5.6* 5.5* 6.0  ALBUMIN 2.3* 2.3* 2.3*   A:  1) GERD 2) Diarrhea - resolved. C. Diff negative. 3) Colonic Ileus - worsening ileus. 4) Paraesophageal nodes - need FU CT in 3-4 mnths P: - Continue TF, Pepcid - PEG delayed with lack of contrast, note colonic ileus - Minimize narcotics. -swallow eval  HEMATOLOGIC  Lab 05/07/12 0440 05/06/12 0430  WBC 16.8* 14.3*  HGB 15.3 15.7  HCT 48.6 48.9  MCV 92.9 93.9  PLT 253 226   A:  1) Leukocytosis -  Increased this AM,  likely in setting of high dose steroids that were escalated yesterday - no fever. 3) Anticoagulation in setting of atrial fibrillation - New diagnosis. CHADS 3. Coumadin on home. On heparin gtt. 4) Bilateral PE 5) Bilateral DVT - IVC filter placement 1/27 P:  - IV heparin for VTE's - No coumadin for now until PEG done - SCD  INFECTIOUS  Lab 05/07/12 0440 05/06/12 0430  WBC 16.8* 14.3*  NEUTROABS -- --   A: 1) Flu pneumonia - Flu A and metapneumovirus > finished Tamiflu x 10d, droplet isolation d/c 1/18 2) Aspirated at time of intubation, pneumonitis vs PNA> finished Rocephin x8d. Aspirated during reintubation.  3) Nosocomial infection - Left IJ (in 8d) dc'ed 1/13 and cath tip cultures>staph/coag neg > tx finished Vanc  4) Diarrhea - resolved. 5) Leukocytosis - likely 2/2 steroids. Ceftaz 1/18 --> 1/29 w/ sputum cx negative. Procalcitonin 0.14 --> 0.15. P: Continue to monitor for s/s of additional infection, observe off abx  ENDOCRINE  Lab 05/07/12 0404 05/06/12 2336 05/06/12 1939 05/06/12 1652 05/06/12 1156  GLUCAP 119* 148* 116* 119* 149*   A:  1) Diabetes mellitus, type 2 (A1c 7.1) - possible iatrogenic secondary to prolonged steroid use. 2) On steroids - CBGs at goal. Lantus off. P: ICU Hyperglycemic protocol  NEUROLOGIC / PSYCHIATRIC   A:  1) Acute encephalopathy - Resolved. Likely secondary to hypoxia / hypercarbia and possible infectious process (CAP?). >returned 1/18 >requiring intubation  2) Delerium - persistent, controlled on propofol and fentanyl, requiring high doses, inhibiting weaning. AVOID BZDs  P:  - Again, goal to stay off drips -  methadone 10mg , seroquel 200 bid on hold since no NGT , fent prn - Monitor QTc - haldol 5 q6 standing - Does not qualify for LTAC, SNF/trach beds [place] ([place] out of state) - PT/ OT consult. - Ativan to IV - Continue fentanyl prn -dc benadryl  CLINICAL SUMMARY: 43 yoM with COPD (3ppd smoking) admitted 1/5 with VDRF  2/2 influenza A and asp PNA, intubated 04/13/11 - 1/15 for AW protection, aspirated during intubation. Reintubated emergently 1/18 for severe delerium & resp distress, purulent secretions noted. Trach 1/22. New PE, DVT BL, s/p IVF filter 1/27. On hep gtt. Ileus persistent, re-consult IR for PEG once improved. To SDU when off drips.  Signed: Johnette Abraham, Roma Schanz, Internal Medicine Resident Pager: [phone] (7AM-5PM)  Care during the described time interval was provided by me and/or other providers on the critical care team.  I have reviewed this patient's available data, including medical history, events  of note, physical examination and test results as part of my evaluation  CC time x 70m  Malekai Markwood V.

## 2012-05-06 NOTE — Progress Notes (Signed)
Following along for eventual G-tube. Abd x-rays today show persistent colonic ileus with dilated bowel loops that prohibit image guided gastrostomy tube at present time. Will check periodically for resolution and plan for g-tube when appropriate.

## 2012-05-06 NOTE — Progress Notes (Signed)
eLink Physician-Brief Progress Note Patient Name: Cody [name] DOB: 04-25-1950 MRN: [id number]  Date of Service  05/06/2012   HPI/Events of Note  Increased agitation through the night.  Increased doses of fentanyl helped initially but now no response.  EKG QTc is less than 500.  eICU Interventions  Plan: 5 mg haldol IV now.  If now response then consider higher dosing.   Intervention Category Major Interventions: Delirium, psychosis, severe agitation - evaluation and management  Brodyn Depuy 05/06/2012, 5:43 AM

## 2012-05-06 NOTE — Progress Notes (Signed)
PULMONARY  / CRITICAL CARE MEDICINE  Name: Cody [name] MRN: [id number] DOB: 20-Jan-1951    LOS: 24  REFERRING MD:   EDP - Dr. Preston Fleeting  CHIEF COMPLAINT:  Shortness of breath  BRIEF PATIENT DESCRIPTION: 87 yoM with COPD (3ppd smoking) admitted 1/5 with VDRF 2/2 influenza A and asp PNA, intubated 04/13/11 - 1/15 for AW protection, aspirated during intubation. Reintubated emergently 1/18 for severe delerium & resp distress, purulent secretions noted. Trach 1/22. New PE, DVT BL, s/p IVF filter 1/27. On hep gtt.   LINES / TUBES: ETT 1/5 >>1/15 , 1/18 >> self extubated 1/21 >>> reintubated 1/21 L IJ 1/5 >> 1/13 R IJ 1/13 >> 1/16 RIJ 1/18 >> 1/28 R A-line 1/5 >>1/8 pulled out by pt Trach 1/22 IVC Filer 1/27 >> PICC 1/28 >>  CULTURES: Blood culture 1/5>> Negative Resp culture 1/6>> H. influenzae  Resp viral panel 1/6>> Influenza A and Metapneumovirus  Sputum Leigonella Culture 1/6>> Negative Bronch Sputum 1/6>> H. influenzae  Flu 1/5>> Neg Blood culture 01/13 >> NG Catheter tip culture 1/13 >>Staph -coag neg-70 colonies(vanc sens)  C.diff PCR 1/14 >> Negative Resp culture 1/18 >>> Negative  ANTIBIOTICS: Zosyn 1/5>>1/9 - restarted 1/22 >>1/28 Azithro 1/5>>1/8  Vanc 1/6>>>1/8 - restarted 1/13 >>1/25 Tamiflu 1/6>>>1/16  Rocephin 1/9>>1/15 Ceftazidine 1/18 >>1/22   SIGNIFICANT EVENTS:  1/5 Intubated in ED  1/5 Shock , levophed  1/6 ECHO - 55%, mod dil rv, PA pressures>>> not measured b/c no TR jet  1/6 Bronched- diffuse pus all lobes  1/7 increasing pCO2, increased RR on vent  1/8 Changed sedation to Precedex, more agitated than with versed  1/9 Changed sedation back to Versed, agitation greatly improved  1/10 scheduled haldol  1/12 Restarting precedex gtt due to persistent agitation 1/13 Febrile to 102.6 overnight. ? Catheter infection? DC'ed L IJ, placed new R IJ. 1/14 No fevers overnight, agitation controlled on propofol (added 1/13), more interactive. 1/15 Had foul  smelling stools overnight, therefore, C.diff PCR was sent by RN. 1/15 EXTUBATED, developed A.fib with RVR overnight. 1/16 Persistent episodes of desaturation., afib>started on cardizem drip  1/17 increased agitation  1/18 reintubated  1/20 Persistent A. Fib this morning on dilt drip.  Rates controlled between 99-125 1/21 Self extubated, given a chance and continued to be in respiratory distress so reintubated.  1/22 Tracheostomy, complicated by bleeding, angio edema from protamine 1/23 Continued agitation. 1/23- neg acute ct head 1/24 PEG placement planned --> delayed bc of ileus 1/25 CTA showing PE BL --> started on heparin gtt. 1/26 BL LE DVT with mobile thrombus 1/27 IVC Filter placement. 1/28 PICC Line   LEVEL OF CARE:  ICU PRIMARY SERVICE:  PCCM CONSULTANTS:  None CODE STATUS: Full DIET:  Tube feeds DVT Px:  Heparin gtt GI Px:  Protonix   SUBJECTIVE / INTERVAL HISTORY:  More interactive this morning. Somewhat appropriate. Indicates no pain.  Interval Events: - Increase agitation throughout the night, requiring increased doses of fentanyl. IV Haldol 5 mg x1 administered. - Restarted on Fentanyl drip for anxiety surrounding PICC placement. - Large BM  VITAL SIGNS: Temp:  [97.5 F (36.4 C)-99.6 F (37.6 C)] 97.8 F (36.6 C) (01/29 0400) Pulse Rate:  [37-180] 99  (01/29 0700) Resp:  [17-29] 19  (01/29 0700) BP: (97-161)/(67-110) 141/101 mmHg (01/29 0700) SpO2:  [88 %-100 %] 96 % (01/29 0700) FiO2 (%):  [40 %-50 %] 40 % (01/29 0600) Weight:  [205 lb 7.5 oz (93.2 kg)] 205 lb 7.5 oz (93.2 kg) (01/29  33) HEMODYNAMICS: VENTILATOR SETTINGS: Vent Mode:  [-] PRVC FiO2 (%):  [40 %-50 %] 40 % Set Rate:  [18 bmp] 18 bmp Vt Set:  [500 mL] 500 mL PEEP:  [5 cmH20] 5 cmH20 Pressure Support:  [10 cmH20] 10 cmH20 Plateau Pressure:  [12 cmH20-15 cmH20] 14 cmH20 INTAKE / OUTPUT: Intake/Output      01/28 0701 - 01/29 0700 01/29 0701 - 01/30 0700   I.V. (mL/kg) 1512.1 (16.2)      NG/GT 100    IV Piggyback 200    Total Intake(mL/kg) 1812.1 (19.4)    Urine (mL/kg/hr) 2350 (1.1)    Total Output 2350    Net -537.9          PHYSICAL EXAMINATION: General: Tracheostomy in place, on ventilator, opens eyes to command and tracks around the room. Partly follows commands.  Head: Normocephalic, Trach in place, no bleeding.  Lungs:  Diminished BS, course rhonchi.    Heart: Irregularly irregular rhythm, regular rate.  Abdomen:  BS normoactive. Soft, Nondistended, non-tender.  No masses or organomegaly.  Extremities: No pretibial edema.     IMAGING: 1/25 - CTA chest - 1. Pulmonary embolus within segmental branches to the right upper lobe, left upper lobe and left lower lobe, extending distally. 2. Bilateral lower lobe airspace opacification is thought to reflect atelectasis, with trace bilateral pleural effusions. No definite evidence for aspiration. 3. Mild nonspecific left upper lobe opacities may reflect atelectasis. 1/26 - Abd XR - Interval development of a moderate colonic ileus since the examination 2 days ago. No evidence of bowel obstruction. 1/26 - LE Dopplers - 1. Acute DVT right saphenofemoral junction, common femoral vein. 2. Acute DVT left saphenofemoral junction, left femoral vein, and mobile thrombus in the left common femoral vein.   DIAGNOSES: Principal Problem:  *Acute respiratory failure with hypercapnia Active Problems:  HYPERLIPIDEMIA  TOBACCO USE  HYPERTENSION  GERD  COPD (chronic obstructive pulmonary disease)  Acute encephalopathy  Acute kidney injury  Atrial fibrillation  PE (pulmonary embolism)  ASSESSMENT / PLAN:  PULMONARY  Lab 05/02/12 2115 04/30/12 0839  PHART 7.475* 7.337*  PCO2ART 53.7* 68.1*  PO2ART 52.3* 84.0  HCO3 39.1* 36.4*  TCO2 40.7 38   A: 1) Acute hypercapnic respiratory failure/ARDS - secondary to flu, COPD exacerbation. Trach 1/22. 2) Aspiration - at time of intubation, finished course of treatment  3)  Pneumonitis vs. CAP - b/l interstitial opacities on cxr, slightly increased at left, likely atelectasis. Completed 8/8 days rocephin , on tapering steroids  4) Pulmonary edema - improved. 4) Non-oxygen dependent COPD - h/o chronic tobacco abuse hx 3 pack per day 20+ years.  5) Bloody secretions from aspiration - Pulm /renal syndrome ruled out. 6) Upper airway severe angio edema & rash  from protamine  7) Likley small hematoma on rt apex from neck oozing  P:  - SBTs now that tachy resolved, goal ATC as tolerated - Duonebs q6h + q2h PRN. - increase steroids for rash - solumedrol 40 q 12h + benadryl 25 tid  CARDIOVASCULAR  A:  1) New onset Afib with RVR - rate controlled. On cardizem, amio. CE neg x 3. On heparin gtt. 2) Hypotension - mild. With hx of HTN 3) Anaphylaxis to protamine - protamine given in setting of trach placement, resultant anaphylactic reaction - resolved.  P:  - Goal HR < 120, cardizem  PO & amio 400 po.  RENAL  Lab 05/06/12 0445 05/05/12 0420 05/04/12 1517  NA 143 141 --  K 2.9*  3.2* --  CL 100 100 --  CO2 33* 30 --  BUN 19 27* --  CREATININE 0.54 0.62 --  GLUCOSE 107* 110* --  MG -- 1.8 1.9  PHOS -- 2.6 4.1   A:  1) Acute Kidney injury - Resolved. Previously likely pre-renal in setting of volume depletion. Previously urinary sediment clogging foley, improved. Continue to monitor in setting of lasix use. 2) Hypernatremia - resolved  3) Hypokalemia - in setting of lasix.  P:  - Goal to keep even. - dc Lasix  Until K corrected - Continue daily    GASTROINTESTINAL  Lab 05/05/12 0420 05/04/12 1517 05/02/12 0425  AST 24 18 27   ALT 33 39 37  ALKPHOS 72 77 72  BILITOT 0.9 0.8 0.8  PROT 5.6* 5.5* 6.0  ALBUMIN 2.3* 2.3* 2.3*   A:  1) GERD 2) Diarrhea - resolving. C. Diff negative. 3) Colonic Ileus - has had BM x 2. Completed IV reglan x 6 doses. 4) Paraesophageal nodes - need FU CT in 3-4 mnths P: - Continue TF, Pepcid - PEG delayed with lack of  contrast, note colonic ileus - Minimize narcotics. -replete K  HEMATOLOGIC  Lab 05/06/12 0430 05/05/12 0420  WBC 14.3* 12.6*  HGB 15.7 15.1  HCT 48.9 47.4  MCV 93.9 92.9  PLT 226 206   A:  1) Leukocytosis -  Increased this AM, likely in setting of high dose steroids and hemoconcetration - no fever. 3) Anticoagulation in setting of atrial fibrillation - New diagnosis. CHADS 3. Coumadin on home. On heparin gtt. 4) Bilateral PE 5) Bilateral DVT - IVC filter placement 1/27 P:  - IV heparin for VTE's - No coumadin for now until peg done - SCD  INFECTIOUS  Lab 05/06/12 0430 05/05/12 0420  WBC 14.3* 12.6*  NEUTROABS -- --   A: 1) Flu pneumonia - Flu A and metapneumovirus > finished Tamiflu x 10d, droplet isolation d/c 1/18 2) Aspirated at time of intubation, pneumonitis vs PNA> finished Rocephin x8d. Aspirated during reintubation.  3) Nosocomial infection - Left IJ (in 8d) dc'ed 1/13 and cath tip cultures>staph/coag neg > tx finished Vanc  4) Diarrhea - resolved. 5) Leukocytosis - likely 2/2 steroids. Ceftaz 1/18 --> 1/29 w/ sputum cx negative. Procalcitonin 0.14 --> 0.15. 6) New fever ? pe /dvt related - Afebrile overnight.  P: Continue to monitor for s/s of additional infection, observe off abx  ENDOCRINE  Lab 05/06/12 0411 05/05/12 2313 05/05/12 1955 05/05/12 1754 05/05/12 1230  GLUCAP 106* 81 96 220* 177*   A:  1) Diabetes mellitus, type 2 (A1c 7.1) - possible iatrogenic secondary to prolonged steroid use. 2) On steroids - CBGs at goal. Lantus off. P: ICU Hyperglycemic protocol  NEUROLOGIC / PSYCHIATRIC   A:  1) Acute encephalopathy - Resolved. Likely secondary to hypoxia / hypercarbia and possible infectious process (CAP?). >returned 1/18 >requiring intubation  2) Delerium - persistent, controlled on propofol and fentanyl, requiring high doses, inhibiting weaning. AVOID BZDs  P:  - Again, goal to stay off drips - Continue methadone 10  and seroquel 200 bid , fent  prn - Monitor QTc - 410 ms (1/29) - Does not qualify for LTAC, SNF/trach beds requested (possibly out of state) - PT/ OT consult. - Ativan 1 q12h  CLINICAL SUMMARY: 79 yoM with COPD (3ppd smoking) admitted 1/5 with VDRF 2/2 influenza A and asp PNA, intubated 04/13/11 - 1/15 for AW protection, aspirated during intubation. Reintubated emergently 1/18 for severe delerium & resp  distress, purulent secretions noted. Trach 1/22. New PE, DVT BL, s/p IVF filter 1/27. On hep gtt. Ileus resolving, re-consult IR for PEG. To SDU when off drips.  Signed: Johnette Abraham, Roma Schanz, Internal Medicine Resident Pager: [phone] (7AM-5PM)  Care during the described time interval was provided by me and/or other providers on the critical care team.  I have reviewed this patient's available data, including medical history, events of note, physical examination and test results as part of my evaluation  CC time x  32 m  Catrena Vari V.

## 2012-05-06 NOTE — Progress Notes (Signed)
Trach Team Patient Details Name: XYON LUKASIK MRN: [id number] DOB: Oct 08, 1950 Today's Date: 05/06/2012 Time:  -    SLP reviewed chart as part of trach team.  Pt. Appears to be tolerating trach collar and is participating with PT.  Pt. May benefit from PMSV assessment.  Please order if agree.  Breck Coons [place].Ed [employer] [phone]  05/06/2012

## 2012-05-06 NOTE — Progress Notes (Signed)
eLink Physician-Brief Progress Note Patient Name: Cody [name] DOB: 08/31/1950 MRN: [id number]  Date of Service  05/06/2012   HPI/Events of Note   Hypokalemia  eICU Interventions  Potassium replaced   Intervention Category Intermediate Interventions: Electrolyte abnormality - evaluation and management  DETERDING,ELIZABETH 05/06/2012, 5:48 AM

## 2012-05-06 NOTE — Progress Notes (Signed)
ANTICOAGULATION CONSULT NOTE  Pharmacy Consult for heparin Indication: pulmonary embolus and DVT  Allergies  Allergen Reactions  . Protamine Anaphylaxis   Patient Measurements: Height: 5\' 10"  (177.8 cm) Weight: 205 lb 7.5 oz (93.2 kg) IBW/kg (Calculated) : 73  Heparin Dosing Weight: 92 kg  Vital Signs: Temp: 99 F (37.2 C) (01/29 0800) Temp src: Oral (01/29 0800) BP: 148/82 mmHg (01/29 0838) Pulse Rate: 96  (01/29 0838)  Labs:  Basename 05/06/12 0445 05/06/12 0430 05/05/12 0420 05/04/12 1517 05/04/12 0430  HGB -- 15.7 15.1 -- --  HCT -- 48.9 47.4 -- 46.8  PLT -- 226 206 -- 211  APTT -- -- -- -- --  LABPROT -- -- -- 12.5 --  INR -- -- -- 0.94 --  HEPARINUNFRC -- 0.48 0.30 -- 0.67  CREATININE 0.54 -- 0.62 0.60 --  CKTOTAL -- -- -- -- --  CKMB -- -- -- -- --  TROPONINI -- -- -- -- --    Estimated Creatinine Clearance: 108.4 ml/min (by C-G formula based on Cr of 0.54).  Assessment: 62 yo male with h/o atrial fibrillation, now with new DVT and PE, for heparin. Heparin currently therapuetic, CBC stable, no recent bleeding. Post IVC filter 1/27.  Goal of Therapy:  Heparin level 0.3-0.7 units/ml Monitor platelets by anticoagulation protocol: Yes   Plan:  Continue 2450 units/hr  Follow up with morning heparin level.  Sheppard Coil, Pharm.D. Clinical Pharmacist  05/06/2012 9:52 AM

## 2012-05-06 NOTE — Progress Notes (Signed)
eLink Physician-Brief Progress Note Patient Name: PEACE JOST DOB: 12-21-1950 MRN: [id number]  Date of Service  05/06/2012   HPI/Events of Note  Patient remains agitated.  Has pulled out NGT x 2.  Has not responded to current dosing of versed/fentanyl and has not been able to take the ativan or Seroquel.   eICU Interventions  Plan: Haldol dosing for agitated delirium 12 lead EKG in AM to evaluate QTc   Intervention Category Major Interventions: Delirium, psychosis, severe agitation - evaluation and management  DETERDING,ELIZABETH 05/06/2012, 11:33 PM

## 2012-05-07 LAB — BASIC METABOLIC PANEL
BUN: 19 mg/dL (ref 6–23)
CO2: 30 mEq/L (ref 19–32)
Calcium: 9.4 mg/dL (ref 8.4–10.5)
Chloride: 100 mEq/L (ref 96–112)
Creatinine, Ser: 0.47 mg/dL — ABNORMAL LOW (ref 0.50–1.35)

## 2012-05-07 LAB — CBC
HCT: 48.6 % (ref 39.0–52.0)
MCH: 29.3 pg (ref 26.0–34.0)
MCV: 92.9 fL (ref 78.0–100.0)
RBC: 5.23 MIL/uL (ref 4.22–5.81)
WBC: 16.8 10*3/uL — ABNORMAL HIGH (ref 4.0–10.5)

## 2012-05-07 LAB — GLUCOSE, CAPILLARY
Glucose-Capillary: 119 mg/dL — ABNORMAL HIGH (ref 70–99)
Glucose-Capillary: 118 mg/dL — ABNORMAL HIGH (ref 70–99)
Glucose-Capillary: 162 mg/dL — ABNORMAL HIGH (ref 70–99)
Glucose-Capillary: 139 mg/dL — ABNORMAL HIGH (ref 70–99)

## 2012-05-07 MED ORDER — METHYLPREDNISOLONE SODIUM SUCC 40 MG IJ SOLR
40.0000 mg | INTRAMUSCULAR | Status: DC
  Administered 2012-05-08 – 2012-05-11 (×4): 40 mg via INTRAVENOUS
  Filled 2012-05-07 (×5): qty 1

## 2012-05-07 MED ORDER — LORAZEPAM 2 MG/ML IJ SOLN
INTRAMUSCULAR | Status: AC
  Administered 2012-05-07: 1 mg via INTRAVENOUS
  Filled 2012-05-07: qty 1

## 2012-05-07 MED ORDER — FAMOTIDINE IN NACL 20-0.9 MG/50ML-% IV SOLN
20.0000 mg | Freq: Every day | INTRAVENOUS | Status: DC
  Administered 2012-05-07 – 2012-05-26 (×20): 20 mg via INTRAVENOUS
  Filled 2012-05-07 (×27): qty 50

## 2012-05-07 MED ORDER — SODIUM CHLORIDE 0.9 % IV SOLN
INTRAVENOUS | Status: DC | PRN
  Administered 2012-05-07 – 2012-05-19 (×5): via INTRAVENOUS

## 2012-05-07 MED ORDER — HALOPERIDOL LACTATE 5 MG/ML IJ SOLN
5.0000 mg | Freq: Four times a day (QID) | INTRAMUSCULAR | Status: DC
  Administered 2012-05-07 – 2012-05-21 (×50): 5 mg via INTRAVENOUS
  Filled 2012-05-07 (×71): qty 1

## 2012-05-07 MED ORDER — LORAZEPAM 2 MG/ML IJ SOLN
1.0000 mg | Freq: Two times a day (BID) | INTRAMUSCULAR | Status: DC
  Administered 2012-05-07 – 2012-05-11 (×9): 1 mg via INTRAVENOUS
  Filled 2012-05-07 (×9): qty 1

## 2012-05-07 NOTE — Evaluation (Signed)
[facility]y-[facility] Speaking Valve - Evaluation Patient Details  Name: Cody [name] MRN: [id number] Date of Birth: May 03, 1950  Today's Date: 05/07/2012 Time: 4098-1191 SLP Time Calculation (min): 58 min  Past Medical History:  Past Medical History  Diagnosis Date  . Stroke   . Hypertension   . Hyperlipidemia   . GERD (gastroesophageal reflux disease)   . COPD (chronic obstructive pulmonary disease)    Past Surgical History: History reviewed. No pertinent past surgical history. HPI:  BSE completed initially 04/23/12. Pt was placed on po diet and discharged from SLP caseload. Subsequently, pt was reintubated due to delerium and respiratory distress. Pt self extubated 1/21, and was reintubated that day. Trach placed 1/22 (#8 cuffed Shiley), with development of anterior cervical hemorrhage.  PEG placement was planned, but was deferred due to ileus. Pt developed PE and DVT, and had ICV filter and PICC line placed. Orders received this date for PMSV evaluation. No family present at this time.    Assessment / Plan / Recommendation Clinical Impression  Pt seen today for evaluation of PMSV placement. Pt was initially apprehensive, but SLP and student nurse explained the process step by step, which appeared to calm him. Pt tolerated cuff deflation and finger occlusion with voicing acheived. Pt tolerated PMSV placement for 15 minutes, and appeared to be fatiguing, so PMSV was removed and placed in the container.  Vital signs were monitored throughout PMV trial, and levels were maintained appropriately. It is felt that pt will tolerate brief periods of placement with SLP or RN present (when family /MD visits), but when unsupervised  the valve be removed. SLP will continue to follow pt to increase time wearing the PMSV, as well as improving breath support, phonation, and eventually swallow function and safety. BSE not completed at this time, due to continued ileus.  Precautions regarding need for cuff deflation  and supervision with PMV use,  removal of valve during sleep and breathing treatments, and recommended placement with only SLP and RN at this time were posted at [facility]. Warning label re: importance of deflating cuff posted at entrance to pt's room.  RN was informed that the PMSV was removed by SLP at the end of this evaluation, and placed in its holding container.      SLP Assessment  Patient needs continued Speech Lanaguage Pathology Services    Follow Up Recommendations  24 hour supervision/assistance    Frequency and Duration min 2x/week  2 weeks   Pertinent Vitals/Pain No pain indicated    SLP Goals Potential to Achieve Goals: Good Progress/Goals/Alternative treatment plan discussed with pt/caregiver and they: Patient unable to parrticipate in goal setting SLP Goal #1: Pt will tolerate PMSV during waking hours, including therapies and throughout mealtimes. SLP Goal #1 - Progress: Progressing toward goal SLP Goal #2: Pt will demonstrate appropriate volume and breath support to facilitate effective communication. SLP Goal #2 - Progress: Progressing toward goal SLP Goal #3: Pt/family will partipate in education and training re: use, care and cleaning of PMSV, and return demonstration of this education. SLP Goal #3 - Progress: Progressing toward goal   PMSV Trial  PMSV was placed for: 15 minutes Able to redirect subglottic air through upper airway: Yes Able to Attain Phonation: Yes Voice Quality:  (raspy) Able to Expectorate Secretions: No Level of Secretion Expectoration with PMSV: Other (comment) (none) Breath Support for Phonation: Mildly decreased Intelligibility: Intelligibility reduced Word: 75-100% accurate Phrase: 50-74% accurate Sentence: 50-74% accurate Respirations During Trial: 19  SpO2 During Trial: 91 % (  Per RN goal is 88% or better) Pulse During Trial: 113  Behavior: Alert;Confused;Responsive to questions;Cooperative   Tracheostomy Tube    #8 cuffed Shiley    Vent Dependency  FiO2 (%): 40 %    Cuff Deflation Trial Tolerated Cuff Deflation: Yes Length of Time for Cuff Deflation Trial: 15 minutes Behavior: Alert;Confused;Cooperative Cuff Deflation Trial - Comments: Pt appeared to tolerate well.  Vital remained stable.  Pt tolerated finger occlusion well. Able to phonate. Raspy voice quality noted.  Talli Kimmer B. [facility], CCC-SLP [phone]   Leigh Aurora 05/07/2012, 3:16 PM

## 2012-05-07 NOTE — Consult Note (Signed)
Physical Medicine and Rehabilitation Consult Reason for Consult: Deconditioning/respiratory failure Referring Physician: Critical care   HPI: Cody [name] is a 62 y.o. right-handed male with history of CVA, COPD with tobacco abuse. Admitted 04/12/2012 with increasing shortness of breath x4 days as well as productive cough occasional hemoptysis and increased confusion. Arrived in the ED in respiratory distress with oxygen saturation 60% on room air placed on NRB. Patient with increasing restlessness agitation and was intubated. Chest x-ray with acute pulmonary edema or atypical pneumonia. Placed on broad-spectrum antibiotics per critical care medicine as well as intravenous Solu-Medrol. Long-term ventilatory support with tracheostomy performed 04/29/2012 per Dr. [name]. Echocardiogram with ejection fraction 55% without PFO identified. Lower extremity Dopplers were negative on 04/13/2012 repeat 05/03/2012 showing acute DVT right saphenofemoral junction and right common femoral vein as well as left saphenofemoral junction femoral vein as well as CT angina chest 05/02/2012 showing pulmonary embolism within segmental branches to the right upper lobe, left upper lobe and left lower lobe.. An IVC filter was placed 05/04/2012 and IV heparin initiated 05/07/2012. Ongoing bouts of agitation patient has received Ativan as well as Haldol. There was plan for gastrostomy tube placement  for nutritional support however patient developed persistent ileus and procedure was placed on hold. Physical therapy evaluation completed 05/02/2012 ongoing patient with noted profound deconditioning as well as agitation confusion. Physical medicine rehabilitation consult requested to consider inpatient rehabilitation services   Review of Systems  Unable to perform ROS  Past Medical History  Diagnosis Date  . Stroke   . Hypertension   . Hyperlipidemia   . GERD (gastroesophageal reflux disease)   . COPD (chronic obstructive  pulmonary disease)    History reviewed. No pertinent past surgical history. Family History  Problem Relation Age of Onset  . COPD Mother    Social History:  reports that he has been smoking Cigarettes.  He has been smoking about .5 packs per day. He has never used smokeless tobacco. He reports that he does not drink alcohol or use illicit drugs. Allergies:  Allergies  Allergen Reactions  . Protamine Anaphylaxis   Medications Prior to Admission  Medication Sig Dispense Refill  . amLODipine (NORVASC) 5 MG tablet Take 1 tablet (5 mg total) by mouth daily.  90 tablet  3  . aspirin 81 MG tablet Take 81 mg by mouth daily.        . budesonide-formoterol (SYMBICORT) 160-4.5 MCG/ACT inhaler Inhale 2 puffs into the lungs 2 (two) times daily.  1 Inhaler  3  . hydrochlorothiazide (HYDRODIURIL) 25 MG tablet Take 1 tablet (25 mg total) by mouth daily.  90 tablet  3  . Ibuprofen-Diphenhydramine HCl (ADVIL PM) 200-25 MG CAPS Take 1 capsule by mouth at bedtime as needed. For sleep      . lisinopril (PRINIVIL,ZESTRIL) 40 MG tablet Take 1 tablet (40 mg total) by mouth daily.  90 tablet  3    Home: Home Living Lives With: Spouse Available Help at Discharge: Family;Available 24 hours/day Type of Home: Mobile home Home Access: Stairs to enter Entrance Stairs-Number of Steps: 3 Entrance Stairs-Rails: Right;Left Home Layout: One level Bathroom Shower/Tub: Health visitor: Standard Bathroom Accessibility: Yes How Accessible: Accessible via walker Home Adaptive Equipment: Built-in shower seat  Functional History: Prior Function Able to Take Stairs?: Yes Driving: Yes Vocation: Full time employment Functional Status:  Mobility: Bed Mobility Bed Mobility: Rolling Right;Rolling Left Rolling Right: 3: Mod assist;With rail Rolling Right: Patient Percentage: 30% Rolling Left: 3: Mod assist;With rail  Rolling Left: Patient Percentage: 30% Sit to Supine: 3: Mod assist;HOB flat Scooting  to HOB: 1: +2 Total assist Scooting to [facility]: Patient Percentage: 0% Transfers Transfers: Sit to Stand;Stand to Sit Sit to Stand: 1: +2 Total assist;From bed;With upper extremity assist Sit to Stand: Patient Percentage: 40% Stand to Sit: With upper extremity assist Stand to Sit: Patient Percentage: 60% (or greater) Ambulation/Gait Ambulation/Gait Assistance: Not tested (comment) Ambulation/Gait: Patient Percentage: 60% Ambulation Distance (Feet): 5 Feet (forward and back) Assistive device: Rolling walker Ambulation/Gait Assistance Details: vc's for postural checks and postural support and steadying assist Gait Pattern: Step-to pattern;Shuffle;Trunk flexed Wheelchair Mobility Wheelchair Mobility: No  ADL: ADL Grooming: Performed;Wash/dry face;Wash/dry hands;Set up Where Assessed - Grooming: Unsupported sitting Upper Body Bathing: Simulated;Set up Where Assessed - Upper Body Bathing: Unsupported sitting Lower Body Bathing: Simulated;Minimal assistance Where Assessed - Lower Body Bathing: Unsupported sitting Upper Body Dressing: Simulated;Set up Where Assessed - Upper Body Dressing: Unsupported sitting Lower Body Dressing: Simulated;Minimal assistance Where Assessed - Lower Body Dressing: Unsupported sitting Toilet Transfer: Simulated;Moderate assistance Toilet Transfer Method: Sit to Barista:  (chair) Equipment Used:  (none) Transfers/Ambulation Related to ADLs: Pt stood (impulsively) from chair with superivison but required mod assist to control descent to chair. ADL Comments: Pt in chair upon OT arrival and reporting he did not want to get out of chair and ambulate in room. While performing eval at chair level, pt suddenly reporting he needed to urinate and was going to bathroom. Pt immediately attempted to [facility] telemetry box and stand from chair.  Pt stood abruptly but immediately began to lose balance and required mod assist to control descent to chair.   Pt reporting he could get up and walk if staff would just let him. Educated on need for assistance due to multiple lines and leads.   Cognition: Cognition Arousal/Alertness: Awake/alert Orientation Level: Oriented to person;Oriented to place Cognition Overall Cognitive Status: Difficult to assess Area of Impairment: Following commands;Attention Difficult to assess due to: Tracheostomy Arousal/Alertness: Awake/alert Orientation Level:  (unable to assess) Behavior During Session: [facility] for tasks performed Current Attention Level: Selective;Sustained Attention - Other Comments: pt responds to his name being called x2 but quickly falls back asleep, does not appear to see the therapist or try to find the therapist in the room when his name was called, did not see any visual tracking, eyes stay focus ahead of him Following Commands: Follows one step commands with increased time;Follows one step commands inconsistently Safety/Judgement: Impulsive Safety/Judgement - Other Comments: Pt attempting to stand and ambulate from chair while attached to O2 on wall, telemetry box and IV pole which was plugged into wall. Cognition - Other Comments: Much improved mentation and arousal today, participating fully in therapy session, following 1 step commands at least 75% of the time today needing extra time to process, attention remains sustained-selective and fades as he fatigues  Blood pressure 117/72, pulse 112, temperature 98.1 F (36.7 C), temperature source Oral, resp. rate 15, height 5\' 10"  (1.778 m), weight 93 kg (205 lb 0.4 oz), SpO2 91.00%. Physical Exam  Vitals reviewed. HENT:  Head: Normocephalic and atraumatic.  Left Ear: External ear normal.  Eyes: Conjunctivae normal and EOM are normal. Pupils are equal, round, and reactive to light.  Neck: Normal range of motion. Neck supple.       Tracheostomy tube in place  Cardiovascular:  No murmur heard.      Cardiac rate controlled at present   Pulmonary/Chest: No respiratory distress. He  has no wheezes.       Decreased breath sounds to bases but clear to auscultation  Abdominal: Soft.       Abdomen is mildly distended with diminished bowel sounds and nontender.  Neurological: He is alert.       Patient did attempt to mouth some words. He was easily distracted. He made good eye contact with examiner. He followed one and two-step demonstrated commands however inconsistent. UE 2+ prox to 4- distally. LE are 2+ HF and KE, 4 with ADF and APF. No gross sensory deficits.   Skin: Skin is warm and dry.       Numerous ecchymoses and bruises on arms, legs.    Results for orders placed during the hospital encounter of 04/12/12 (from the past 24 hour(s))  GLUCOSE, CAPILLARY     Status: Abnormal   Collection Time   05/06/12  4:52 PM      Component Value Range   Glucose-Capillary 119 (*) 70 - 99 mg/dL  GLUCOSE, CAPILLARY     Status: Abnormal   Collection Time   05/06/12  7:39 PM      Component Value Range   Glucose-Capillary 116 (*) 70 - 99 mg/dL  GLUCOSE, CAPILLARY     Status: Abnormal   Collection Time   05/06/12 11:36 PM      Component Value Range   Glucose-Capillary 148 (*) 70 - 99 mg/dL  GLUCOSE, CAPILLARY     Status: Abnormal   Collection Time   05/07/12  4:04 AM      Component Value Range   Glucose-Capillary 119 (*) 70 - 99 mg/dL  HEPARIN LEVEL (UNFRACTIONATED)     Status: Normal   Collection Time   05/07/12  4:40 AM      Component Value Range   Heparin Unfractionated 0.57  0.30 - 0.70 IU/mL  CBC     Status: Abnormal   Collection Time   05/07/12  4:40 AM      Component Value Range   WBC 16.8 (*) 4.0 - 10.5 K/uL   RBC 5.23  4.22 - 5.81 MIL/uL   Hemoglobin 15.3  13.0 - 17.0 g/dL   HCT 44.0  10.2 - 72.5 %   MCV 92.9  78.0 - 100.0 fL   MCH 29.3  26.0 - 34.0 pg   MCHC 31.5  30.0 - 36.0 g/dL   RDW 36.6 (*) 44.0 - 34.7 %   Platelets 253  150 - 400 K/uL  BASIC METABOLIC PANEL     Status: Abnormal   Collection Time   05/07/12   4:40 AM      Component Value Range   Sodium 142  135 - 145 mEq/L   Potassium 3.6  3.5 - 5.1 mEq/L   Chloride 100  96 - 112 mEq/L   CO2 30  19 - 32 mEq/L   Glucose, Bld 132 (*) 70 - 99 mg/dL   BUN 19  6 - 23 mg/dL   Creatinine, Ser 4.25 (*) 0.50 - 1.35 mg/dL   Calcium 9.4  8.4 - 95.6 mg/dL   GFR calc non Af Amer >90  >90 mL/min   GFR calc Af Amer >90  >90 mL/min  PHOSPHORUS     Status: Normal   Collection Time   05/07/12  4:40 AM      Component Value Range   Phosphorus 2.6  2.3 - 4.6 mg/dL  GLUCOSE, CAPILLARY     Status: Abnormal   Collection Time   05/07/12  7:16  AM      Component Value Range   Glucose-Capillary 118 (*) 70 - 99 mg/dL   Comment 1 Notify RN     Comment 2 Documented in Chart    GLUCOSE, CAPILLARY     Status: Abnormal   Collection Time   05/07/12 12:14 PM      Component Value Range   Glucose-Capillary 162 (*) 70 - 99 mg/dL   Comment 1 Documented in Chart     Comment 2 Notify RN     Dg Chest Port 1 View  05/05/2012  *RADIOLOGY REPORT*  Clinical Data: PICC line placement  PORTABLE CHEST - 1 VIEW  Comparison: None.  Findings: PICC line has been inserted in the right arm.  Tip lies at the cavoatrial junction.  Unchanged aeration with basilar atelectasis/ infiltrates.  Pleural effusion less apparent. Unchanged tracheostomy and nasogastric tubes.  Right IJ central venous catheter tip proximal SVC. No pneumothorax.  IMPRESSION: PICC line tip from a right arm approach lies at the cavoatrial junction.  No pneumothorax.   Original Report Authenticated By: Davonna Belling, M.D.    Dg Abd Portable 1v  05/07/2012  *RADIOLOGY REPORT*  Clinical Data: NG tube placement.  PORTABLE ABDOMEN - 1 VIEW  Comparison: Film earlier this date  Findings: An NG tube is coiled overlying the proximal stomach. Gaseous distention of colon is again noted. An IVC filter is identified.  IMPRESSION: NG tube coiled overlying the proximal stomach.   Original Report Authenticated By: Harmon Pier, M.D.    Dg Abd  Portable 2v  05/06/2012  *RADIOLOGY REPORT*  Clinical Data: Ileus, bowel distension  PORTABLE ABDOMEN - 2 VIEW  Comparison: 05/03/2012; 05/01/2012; chest radiograph - 05/05/2012; 05/02/2012  Findings:  Interval passage of previously noted enteric contrast.  Grossly unchanged moderate gaseous distension of the colon with index loop of transverse colon measuring 7.4 cm in diameter.  No definite gaseous distension of the small bowel.  No definite pneumoperitoneum, pneumatosis or portal venous gas.  Limited visualization of the lower thorax demonstrates grossly unchanged enlarged cardiac silhouette and mediastinal contours with nodularity of the bilateral pulmonary hila, right greater than left.  Tracheostomy tube overlies the superior aspect of the tracheal air column.  Interval removal of right jugular approach central venous catheter.  Enteric tube tip and side port project over the central location of the stomach.  Post IVC filter placement.  No definite acute osseous abnormalities.  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 1. Does the need for close, 24 hr/day medical supervision in concert with the patient's rehab needs make it unreasonable for this patient to be served in a less intensive setting? Yes 2. Co-Morbidities requiring supervision/potential complications: copd, AKI, afib with rapid response 3. Due to bladder management, bowel management, safety, skin/wound care, disease management, medication administration, pain management and patient education, does the patient require 24 hr/day rehab nursing? Yes 4. Does the patient require coordinated care of a physician, rehab nurse, PT (1-2 hrs/day, 5 days/week), OT (1-2 hrs/day, 5 days/week) and SLP (1-2  hrs/day, 5 days/week) to address physical and functional deficits in the context of the above medical diagnosis(es)? Yes Addressing deficits in the following areas: balance, endurance, locomotion, strength, transferring, bowel/bladder control, bathing, dressing, feeding, grooming, toileting, cognition, speech, language, swallowing and psychosocial support 5. Can the  patient actively participate in an intensive therapy program of at least 3 hrs of therapy per day at least 5 days per week? Potentially 6. The potential for patient to make measurable gains while on inpatient rehab is good 7. Anticipated functional outcomes upon discharge from inpatient rehab are supervision to min assist with PT, supervision to min assist with OT, supervision to mod I with SLP. 8. Estimated rehab length of stay to reach the above functional goals is: 2-3 weeks 9. Does the patient have adequate social supports to accommodate these discharge functional goals? Potentially 10. Anticipated D/C setting: Home 11. Anticipated post D/C treatments: HH therapy 12. Overall Rehab/Functional Prognosis: excellent  RECOMMENDATIONS: This patient's condition is appropriate for continued rehabilitative care in the following setting: CIR Patient has agreed to participate in recommended program. Yes Note that insurance prior authorization may be required for reimbursement for recommended care.  Comment:Rehab RN to follow up.   Ivory Broad, MD     05/07/2012

## 2012-05-07 NOTE — Progress Notes (Signed)
Agree with note by Dietetic Intern   Clarene Duke RD, LDN Pager [phone] After Hours pager [phone]

## 2012-05-07 NOTE — Progress Notes (Signed)
Clinical Social Work  CSW spoke with CM regarding case. Patient now off vent with trach collar. Per PT, patient did well during session. PT recommends OT be ordered and reports patient possibly a good CIR candidate. CSW met with patient and girlfriend at bedside to discuss dc plans. CSW explained vent SNF no longer needed and provided local SNF list. CSW also called son on phone and explained dc plans. Patient and family agreeable to SNF search. CSW explained multiple county search due to limited facilities that accept trachs. CSW spoke with financial counselor Morrie Sheldon) who verified that patient's Medicaid application was sent to [facility] and will call CSW when worker has been assigned.  CSW completed FL2 and faxed out to [place], [address], [place], [place] and [facility]. CSW will follow up with bed offers.  [place], [place] [phone]

## 2012-05-07 NOTE — Progress Notes (Signed)
ANTICOAGULATION CONSULT NOTE  Pharmacy Consult for heparin Indication: pulmonary embolus and DVT  Allergies  Allergen Reactions  . Protamine Anaphylaxis   Patient Measurements: Height: 5\' 10"  (177.8 cm) Weight: 205 lb 0.4 oz (93 kg) IBW/kg (Calculated) : 73  Heparin Dosing Weight: 92 kg  Vital Signs: Temp: 98.1 F (36.7 C) (01/30 0800) Temp src: Oral (01/30 0800) BP: 121/77 mmHg (01/30 0700) Pulse Rate: 110  (01/30 0700)  Labs:  Basename 05/07/12 0440 05/06/12 0445 05/06/12 0430 05/05/12 0420 05/04/12 1517  HGB 15.3 -- 15.7 -- --  HCT 48.6 -- 48.9 47.4 --  PLT 253 -- 226 206 --  APTT -- -- -- -- --  LABPROT -- -- -- -- 12.5  INR -- -- -- -- 0.94  HEPARINUNFRC 0.57 -- 0.48 0.30 --  CREATININE 0.47* 0.54 -- 0.62 --  CKTOTAL -- -- -- -- --  CKMB -- -- -- -- --  TROPONINI -- -- -- -- --    Estimated Creatinine Clearance: 108.3 ml/min (by C-G formula based on Cr of 0.47).  Assessment: 62 yo male with h/o atrial fibrillation, now with new DVT and PE, for heparin. Heparin currently therapuetic (HL=0.57), CBC stable, no recent bleeding. Post IVC filter 1/27.  Goal of Therapy:  Heparin level 0.3-0.7 units/ml Monitor platelets by anticoagulation protocol: Yes   Plan:  -Continue 2450 units/hr  -Follow up with morning heparin level.  Harland German, Pharm D 05/07/2012 9:57 AM

## 2012-05-07 NOTE — Progress Notes (Signed)
Physical Therapy Treatment Patient Details Name: Cody [name] MRN: [id number] DOB: 1950/06/22 Today's Date: 05/07/2012 Time: 0272-5366 PT Time Calculation (min): 32 min  PT Assessment / Plan / Recommendation Comments on Treatment Session  Adm. SOB and resp fail due to flu and PNA with VDRF 1/5-1/15, 1/18-1/21 with trach and bronchoscopy 1/22. Trach collar today for our session. Progressing well and participating much better today. May see if CIR an option. Pt needs OT consult.     Follow Up Recommendations  CIR     Does the patient have the potential to tolerate intense rehabilitation     Barriers to Discharge        Equipment Recommendations   (tbd)    Recommendations for Other Services Rehab consult;OT consult  Frequency Min 3X/week   Plan Discharge plan needs to be updated;Frequency needs to be updated    Precautions / Restrictions Precautions Precautions: Fall Precaution Comments: trach Restrictions Weight Bearing Restrictions: No   Pertinent Vitals/Pain Pt denies pain; HR (a-fib on cardizem drip fluctuating between 100-130s) MD aware and OK with PT working with patient    Mobility  Bed Mobility Bed Mobility: Rolling Right;Rolling Left Rolling Right: 3: Mod assist;With rail Rolling Left: 3: Mod assist;With rail Details for Bed Mobility Assistance: max verbal sequencing cues with mod facilitation for han dplacement and body positioning as well as initiation of trunk rotation for rolling; used chair position of bed to slowly sit pt up as HR already elevated and fluctuating 100-130s, pt able to sit forward from back of bed (in chair position) with min tactile assist Transfers Transfers: Sit to Stand;Stand to Sit Sit to Stand: 1: +2 Total assist;From bed;With upper extremity assist Sit to Stand: Patient Percentage: 40% Stand to Sit: With upper extremity assist Details for Transfer Assistance: sequencing cues to coordinate pt's efforts, bilateral facilitation for  anterior translation of trunk over feet as well as extend through trunk and stabilize; pt stood with hands on RW needing bilateral support to stay tall (approx 30 seconds before he fatigued and needed to sit); HR still fluctuating (a-fib) 100-130s getting 143 one time during session Ambulation/Gait Ambulation/Gait Assistance: Not tested (comment)    Exercises  Ankle pumps: 10x bilaterally AAROM     PT Goals Acute Rehab PT Goals Pt will Roll Supine to Right Side: with min assist PT Goal: Rolling Supine to Right Side - Progress: Updated due to goal met Pt will Roll Supine to Left Side: with min assist PT Goal: Rolling Supine to Left Side - Progress: Updated due to goal met PT Goal: Sit at Edge Of Bed - Progress: Progressing toward goal Pt will go Sit to Stand: with mod assist;with upper extremity assist PT Goal: Sit to Stand - Progress: Goal set today Pt will go Stand to Sit: with mod assist;with upper extremity assist PT Goal: Stand to Sit - Progress: Goal set today Pt will Ambulate: 1 - 15 feet;with mod assist;with least restrictive assistive device PT Goal: Ambulate - Progress: Goal set today Additional Goals PT Goal: Additional Goal #1 - Progress: Met  Visit Information  Last PT Received On: 05/07/12 Assistance Needed: +2 Reason Eval/Treat Not Completed: Medical issues which prohibited therapy    Subjective Data  Subjective: Nodded no to pain question. Patient Stated Goal: unable to determine   Cognition  Overall Cognitive Status: Difficult to assess Area of Impairment: Following commands;Attention Difficult to assess due to: Tracheostomy Arousal/Alertness: Awake/alert Orientation Level:  (unable to assess) Behavior During Session: [facility] for tasks performed  Current Attention Level: Selective;Sustained Following Commands: Follows one step commands with increased time;Follows one step commands inconsistently Safety/Judgement: Impulsive Cognition - Other Comments: Much  improved mentation and arousal today, participating fully in therapy session, following 1 step commands at least 75% of the time today needing extra time to process, attention remains sustained-selective and fades as he fatigues    Balance  Static Sitting Balance Static Sitting - Balance Support: Bilateral upper extremity supported Static Sitting - Level of Assistance: 4: Min assist Static Sitting - Comment/# of Minutes: min tactile assist for tall posture/extension with anterior pelvic tilt  End of Session PT - End of Session Equipment Utilized During Treatment: Gait belt Activity Tolerance: Patient tolerated treatment well Patient left: in bed (chair position of bed) Nurse Communication: Mobility status   GP     [facility] HELEN 05/07/2012, 1:37 PM

## 2012-05-07 NOTE — Progress Notes (Signed)
Patient found with oral gastric tube pulled out.  Unable to replace feeding tube orally with patient's uncooperativeness preventing insertion.  A 10 french gastric tube was successfully placed via right nares. STAT portable abdominal xray ordered to verify placement per protocol.

## 2012-05-07 NOTE — Progress Notes (Signed)
Rehab Admissions Coordinator Note:  Patient was screened by Clois Dupes for appropriateness for an Inpatient Acute Rehab Consult.  At this time, we are recommending Inpatient Rehab consult as well as OT consult. I have discussed with RN CM to obtain these recommended orders.  Clois Dupes 05/07/2012, 1:50 PM  I can be reached at [phone].

## 2012-05-07 NOTE — Progress Notes (Signed)
Clinical Social Work Department CLINICAL SOCIAL WORK PLACEMENT NOTE 05/07/2012  Patient:  JIMMEY, HENGEL  Account Number:  [id number] Admit date:  04/12/2012  Clinical Social Worker:  Hulan Fray  Date/time:  05/04/2012 11:37 AM  Clinical Social Work is seeking post-discharge placement for this patient at the following level of care:   SKILLED NURSING   (*CSW will update this form in Epic as items are completed)   05/01/2012  Patient/family provided with [facility] Department of Clinical Social Work's list of facilities offering this level of care within the geographic area requested by the patient (or if unable, by the patient's family).  05/01/2012  Patient/family informed of their freedom to choose among providers that offer the needed level of care, that participate in Medicare, Medicaid or managed care program needed by the patient, have an available bed and are willing to accept the patient.  05/01/2012  Patient/family informed of [facility]' ownership interest in [facility], as well as of the fact that they are under no obligation to receive care at this facility.  PASARR submitted to [employer] on 05/04/2012 PASARR number received from [employer] on 05/04/2012  FL2 transmitted to all facilities in geographic area requested by pt/family on  05/01/2012 FL2 transmitted to all facilities within larger geographic area on 05/07/2012  Patient informed that his/her managed care company has contracts with or will negotiate with  certain facilities, including the following:     Patient/family informed of bed offers received:   Patient chooses bed at  Physician recommends and patient chooses bed at    Patient to be transferred to  on   Patient to be transferred to facility by   The following physician request were entered in Epic:   Additional Comments: Clinical Social Worker has submitted vent snf referrals to [place] and [place] facilities.  05/07/12-Patient faxed out for snf  that accepts trach

## 2012-05-07 NOTE — Progress Notes (Deleted)
Dr Deterding called and informed of patient's persistent agitation and nursing staff's inability to insert new feeding tube.  Orders received.

## 2012-05-07 NOTE — Progress Notes (Signed)
Patient was pulled out new feeding tube despite having mittens placed on both hands after first episode.  Patient medicated with Versed 4 mg IVP for increased agitation.  Numerous attempts made to replace feeding tube were unsuccessful.

## 2012-05-07 NOTE — Progress Notes (Signed)
Dr Deterding called and informed of patient's persistent agitation and nursing staff's inability to place new feeding tube.  Orders received.

## 2012-05-07 NOTE — Progress Notes (Signed)
NUTRITION FOLLOW UP  Intervention:   1. Recommend initiation of nutrition support (either TPN vs. enteral nutrition) depending on presence of ileus and ability to take POs.  Nutrition Dx:   Inadequate oral intake, ongoing.   Goal:   Intake to meet >/=90% estimated nutrition needs. Unmet at this time.   Monitor:   Vent status & settings, PEG placement, weight trends, I/O's, swallow evaluation  Assessment:   1/29 Pt pulled out OG tube. RN unable to replace feeding tube orally d/t pt's uncooperativeness. RN able to place 10 french NG tube at this time. Pt pulled out new NG feeding tube despite having mittens placed on  both hands. RN noted numerous attempts were made to replace feeding tube but were unsuccessful.   1/29- Abd x-rays today show persistent colonic ileus with dilated bowel loops that prohibit image guided gastrostomy tube at present time.   Pt currently without feeding tube and has ileus prohibiting PEG placement. Pt has been without nutrition for at least 3 days. Pt currently waiting on swallow evaluation to determine possible PO intake.   Patient is not currently on ventilator support.     Height: Ht Readings from Last 1 Encounters:  09/19/10 5' 10.5" (1.791 m)    Weight Status:   Wt Readings from Last 3 Encounters:  05/07/12 205 lb 0.4 oz (93 kg)  03/25/12 229 lb (103.874 kg)  02/28/12 226 lb (102.513 kg)  Body mass index is 29.42 kg/(m^2). Weight trending down    Re-estimated needs:  Kcal: 2080 - 2300 kcal  Protein: 146 - 160 gm Fluid: ./= 1.2 L daily   Skin: stage 2 pressure ulcer buttocks, moisture associated wound to inner thigh   Diet Order: NPO   Intake/Output Summary (Last 24 hours) at 05/07/12 1101 Last data filed at 05/07/12 1000  Gross per 24 hour  Intake 1124.75 ml  Output   1375 ml  Net -250.25 ml    Last BM: 05/07/2012   Labs:   Lab 05/07/12 0440 05/06/12 0445 05/05/12 0420 05/04/12 1517 05/02/12 0425  NA 142 143 141 -- --  K  3.6 2.9* 3.2* -- --  CL 100 100 100 -- --  CO2 30 33* 30 -- --  BUN 19 19 27* -- --  CREATININE 0.47* 0.54 0.62 -- --  CALCIUM 9.4 8.9 8.7 -- --  MG -- -- 1.8 1.9 2.3  PHOS 2.6 -- 2.6 4.1 --  GLUCOSE 132* 107* 110* -- --    CBG (last 3)   Basename 05/07/12 0716 05/07/12 0404 05/06/12 2336  GLUCAP 118* 119* 148*    Scheduled Meds:   . amiodarone  400 mg Oral Daily  . antiseptic oral rinse  15 mL Mouth Rinse QID  . chlorhexidine  15 mL Mouth Rinse BID  . famotidine (PEPCID) IV  20 mg Intravenous Daily  . haloperidol lactate  5 mg Intravenous Q6H  . insulin aspart  0-15 Units Subcutaneous Q4H  . ipratropium  0.5 mg Nebulization Q6H  . levalbuterol  0.63 mg Nebulization Q6H  . LORazepam      . LORazepam  1 mg Intravenous Q12H  . methylPREDNISolone (SOLU-MEDROL) injection  40 mg Intravenous Q24H  . multivitamin  5 mL Per Tube Daily  . sodium chloride  500 mL Intravenous Once  . sodium chloride  10-40 mL Intracatheter Q12H    Continuous Infusions:   . sodium chloride 10 mL/hr at 05/07/12 1000  . sodium chloride 10 mL/hr at 05/07/12 1000  . diltiazem (  CARDIZEM) infusion 15 mg/hr (05/07/12 1000)  . heparin 2,450 Units/hr (05/07/12 1000)    Belenda Cruise  Dietetic Intern Pager: [phone]

## 2012-05-07 NOTE — Progress Notes (Signed)
PT Cancellation Note  Patient Details Name: Cody [name] MRN: [id number] DOB: 26-Dec-1950   Cancelled Treatment:    Reason Eval/Treat Not Completed: Medical issues which prohibited therapy. Uncontrolled A-fib, per RN hold this morning.    [facility] HELEN 05/07/2012, 9:57 AM Pager: [phone]

## 2012-05-08 ENCOUNTER — Encounter (HOSPITAL_BASED_OUTPATIENT_CLINIC_OR_DEPARTMENT_OTHER): Payer: Self-pay | Admitting: Emergency Medicine

## 2012-05-08 DIAGNOSIS — G92 Toxic encephalopathy: Secondary | ICD-10-CM

## 2012-05-08 DIAGNOSIS — R5381 Other malaise: Secondary | ICD-10-CM

## 2012-05-08 DIAGNOSIS — J9600 Acute respiratory failure, unspecified whether with hypoxia or hypercapnia: Secondary | ICD-10-CM

## 2012-05-08 LAB — BASIC METABOLIC PANEL
Calcium: 9.6 mg/dL (ref 8.4–10.5)
Chloride: 100 mEq/L (ref 96–112)
Creatinine, Ser: 0.61 mg/dL (ref 0.50–1.35)
GFR calc Af Amer: >90 mL/min (ref >90)
GFR calc non Af Amer: >90 mL/min (ref >90)

## 2012-05-08 LAB — GLUCOSE, CAPILLARY
Glucose-Capillary: 115 mg/dL — ABNORMAL HIGH (ref 70–99)
Glucose-Capillary: 134 mg/dL — ABNORMAL HIGH (ref 70–99)
Glucose-Capillary: 116 mg/dL — ABNORMAL HIGH (ref 70–99)

## 2012-05-08 LAB — CBC
Hemoglobin: 15.6 g/dL (ref 13.0–17.0)
MCHC: 33.3 g/dL (ref 30.0–36.0)
Platelets: 241 10*3/uL (ref 150–400)

## 2012-05-08 LAB — HEPARIN LEVEL (UNFRACTIONATED): Heparin Unfractionated: 0.63 IU/mL (ref 0.30–0.70)

## 2012-05-08 MED ORDER — POTASSIUM CHLORIDE 10 MEQ/100ML IV SOLN
10.0000 meq | INTRAVENOUS | Status: AC
  Administered 2012-05-08 (×3): 10 meq via INTRAVENOUS
  Filled 2012-05-08: qty 300

## 2012-05-08 MED ORDER — HEPARIN (PORCINE) IN NACL 100-0.45 UNIT/ML-% IJ SOLN
2300.0000 [IU]/h | INTRAMUSCULAR | Status: DC
  Administered 2012-05-08 – 2012-05-10 (×4): 2400 [IU]/h via INTRAVENOUS
  Administered 2012-05-11: 2300 [IU]/h via INTRAVENOUS
  Administered 2012-05-11: 2400 [IU]/h via INTRAVENOUS
  Administered 2012-05-12 (×2): 2300 [IU]/h via INTRAVENOUS
  Filled 2012-05-08 (×18): qty 250

## 2012-05-08 MED ORDER — METOPROLOL TARTRATE 1 MG/ML IV SOLN
2.5000 mg | INTRAVENOUS | Status: DC | PRN
  Administered 2012-05-08 – 2012-05-23 (×5): 2.5 mg via INTRAVENOUS
  Filled 2012-05-08 (×5): qty 5

## 2012-05-08 MED ORDER — METOPROLOL TARTRATE 1 MG/ML IV SOLN
INTRAVENOUS | Status: AC
  Filled 2012-05-08: qty 5

## 2012-05-08 NOTE — Progress Notes (Signed)
Spoke with patient's partner and discussed CIR. She is very much in agreement with the plan for pt to come to CIR when he is stable. She reports that pt will have 24/7 assistance available at home after d/c. Assured her that I will f/u with her next week. Call [phone] for questions

## 2012-05-08 NOTE — Progress Notes (Signed)
Attempted Nasogastric tube insertion pt did not tolerate, unable to pass into stomach, small amount bleeding noted around nares

## 2012-05-08 NOTE — Progress Notes (Addendum)
Passy-Muir Speaking Valve - Treatment Patient Details  Name: Cody [name] MRN: [id number] Date of Birth: Nov 05, 1950  Today's Date: 05/08/2012 Time: 6962-9528 SLP Time Calculation (min): 20 min  Past Medical History:  Past Medical History  Diagnosis Date  . Stroke   . Hypertension   . Hyperlipidemia   . GERD (gastroesophageal reflux disease)   . COPD (chronic obstructive pulmonary disease)    Past Surgical History: History reviewed. No pertinent past surgical history.  Assessment / Plan / Recommendation Clinical Impression  Treatment session focused on intervention with PMSV.  Pt. on vent overnight and RT present placing pt. on trach collar.  Pt. tolerate cuff deflation, however Spo2 hovering around 89-90% but rose to 92%-93% consistently after RT again suctioned.  PMSV remained on trach hub throughout session with vital signs stable.  He required moderate verbal/visual/tactile cues in attempts to coordinate respiration and phonation, only speak 1-2 words at a time and take deep inhalations for increased vocal intensity.  Intelligibility in short phrases was approximately less than 50%.  Pt.'s endurance is decreased and it is hopeful with continued PT/OT and ST his strength will increase allowing for improved vocal intensity and control.      Plan  Continue with current plan of care    Follow Up Recommendations  Inpatient Rehab    Pertinent Vitals/Pain Pt. denied    SLP Goals Potential to Achieve Goals: Good Progress/Goals/Alternative treatment plan discussed with pt/caregiver and they: Agree SLP Goal #1: Pt will tolerate PMSV during waking hours, including therapies and throughout mealtimes. SLP Goal #1 - Progress: Progressing toward goal SLP Goal #2: Pt will demonstrate appropriate volume and breath support to facilitate effective communication. SLP Goal #2 - Progress: Progressing toward goal SLP Goal #3: Pt/family will partipate in education and training re: use, care and  cleaning of PMSV, and return demonstration of this education.   PMSV Trial  PMSV was placed for: 20 min Able to redirect subglottic air through upper airway: Yes Able to Attain Phonation: Yes Voice Quality: Low vocal intensity (raspy) Able to Expectorate Secretions: No Level of Secretion Expectoration with PMSV: Tracheal Breath Support for Phonation: Moderately decreased Intelligibility: Intelligibility reduced Word: 75-100% accurate Phrase: 50-74% accurate Sentence: 25-49% accurate Respirations During Trial: 23  SpO2 During Trial: 92 % Pulse During Trial: 100  Behavior: Alert;Confused;Cooperative   Tracheostomy Tube       Vent Dependency  FiO2 (%): 60 %    Cuff Deflation Trial       Tolerated Cuff Deflation: Yes Length of Time for Cuff Deflation Trial:  (20-25 min) Behavior: Alert;Cooperative;Confused   Cody [name] [place].Ed [employer] [phone]  05/08/2012

## 2012-05-08 NOTE — Progress Notes (Signed)
PULMONARY  / CRITICAL CARE MEDICINE  Name: Cody [name] MRN: [id number] DOB: 12-14-1950    LOS: 26  REFERRING MD:   EDP - Dr. Preston Fleeting  CHIEF COMPLAINT:  Shortness of breath  BRIEF PATIENT DESCRIPTION: 71 yoM with COPD (3ppd smoking) admitted 1/5 with VDRF 2/2 influenza A and asp PNA, intubated 04/13/11 - 1/15 for AW protection, aspirated during intubation. Reintubated emergently 1/18 for severe delerium & resp distress, purulent secretions noted. Trach 1/22. New PE, DVT BL, s/p IVF filter 1/27. On hep gtt.   LINES / TUBES: ETT 1/5 >>1/15 , 1/18 >> self extubated 1/21 >>> reintubated 1/21 L IJ 1/5 >> 1/13 R IJ 1/13 >> 1/16 RIJ 1/18 >> 1/28 R A-line 1/5 >>1/8 pulled out by pt Trach 1/22 IVC Filter 1/27 >> PICC 1/28 >>  CULTURES: Blood culture 1/5>> Negative Resp culture 1/6>> H. influenzae  Resp viral panel 1/6>> Influenza A and Metapneumovirus  Sputum Leigonella Culture 1/6>> Negative Bronch Sputum 1/6>> H. influenzae  Flu 1/5>> Negative Blood culture 01/13 >> Negative Catheter tip culture 1/13 >>Staph -coag neg-70 colonies(vanc sens)  C.diff PCR 1/14 >> Negative Resp culture 1/18 >>> Negative  ANTIBIOTICS: Zosyn 1/5>>1/9 - restarted 1/22 >>1/28 Azithro 1/5>>1/8  Vanc 1/6>>>1/8 - restarted 1/13 >>1/25 Tamiflu 1/6>>>1/16  Rocephin 1/9>>1/15 Ceftazidine 1/18 >>1/22   SIGNIFICANT EVENTS:  1/5 Intubated in ED  1/5 Shock , levophed  1/6 ECHO - 55%, mod dil rv, PA pressures>>> not measured b/c no TR jet  1/6 Bronched- diffuse pus all lobes  1/7 increasing pCO2, increased RR on vent  1/8 Changed sedation to Precedex, more agitated than with versed  1/9 Changed sedation back to Versed, agitation greatly improved  1/10 scheduled haldol  1/12 Restarting precedex gtt due to persistent agitation 1/13 Febrile to 102.6 overnight. ? Catheter infection? DC'ed L IJ, placed new R IJ. 1/14 No fevers overnight, agitation controlled on propofol (added 1/13), more interactive. 1/15 Had  foul smelling stools overnight, therefore, C.diff PCR was sent by RN. 1/15 EXTUBATED, developed A.fib with RVR overnight. 1/16 Persistent episodes of desaturation., afib>started on cardizem drip  1/17 increased agitation  1/18 reintubated  1/20 Persistent A. Fib this morning on dilt drip.  Rates controlled between 99-125 1/21 Self extubated, given a chance and continued to be in respiratory distress so reintubated.  1/22 Tracheostomy, complicated by bleeding, angio edema from protamine 1/23 Continued agitation. 1/23 Neg acute ct head 1/24 PEG placement planned --> delayed bc of ileus 1/25 CTA showing PE BL --> started on heparin gtt. 1/26 BL LE DVT with mobile thrombus 1/27 IVC Filter placement. 1/28 PICC Line 1/29 Persistent ileus per abdominal x-ray. Attempting to limit narcotics. And 1/30 Tolerated PMSV placement for 15 minutes and fatigue and    LEVEL OF CARE:  ICU PRIMARY SERVICE:  PCCM CONSULTANTS:  None CODE STATUS: Full DIET:  TF on hold as repeatedly pulling tubes out. DVT Px:  Heparin gtt GI Px:  Protonix   SUBJECTIVE / INTERVAL HISTORY:  Patient indicates no pain, nausea, vomiting, shortness of breath  Interval Events: - PMSV placement attempted 1/30, tolerated for 15 minutes. Stopped secondary to fatigue.  - Back in A. fib with RVR overnight, started on when necessary Lopressor.  -remains delerious in pm   VITAL SIGNS: Temp:  [98.2 F (36.8 C)-98.7 F (37.1 C)] 98.3 F (36.8 C) (01/31 0745) Pulse Rate:  [54-164] 104  (01/31 0745) Resp:  [14-26] 17  (01/31 0745) BP: (100-158)/(56-111) 120/81 mmHg (01/31 0745) SpO2:  [87 %-  96 %] 94 % (01/31 0745) FiO2 (%):  [40 %] 40 % (01/31 0444) Weight:  [202 lb 13.2 oz (92 kg)] 202 lb 13.2 oz (92 kg) (01/31 0400) HEMODYNAMICS: VENTILATOR SETTINGS: Vent Mode:  [-] PRVC FiO2 (%):  [40 %] 40 % Set Rate:  [18 bmp] 18 bmp Vt Set:  [500 mL] 500 mL PEEP:  [5 cmH20] 5 cmH20 INTAKE / OUTPUT:  Intake/Output Summary (Last 24  hours) at 05/08/12 0825 Last data filed at 05/08/12 0600  Gross per 24 hour  Intake 1360.83 ml  Output   1140 ml  Net 220.83 ml    PHYSICAL EXAMINATION: General: Tracheostomy in place, on ventilator, opens eyes to command and tracks around the room. Follows commands much more consistently.  Head: Normocephalic, Trach in place, no bleeding.  Lungs:  Diminished BS, coarse breath sounds clearing.Marland Kitchen  Heart: Irregularly irregular rhythm, regular rate.  Abdomen:  BS normoactive. Soft, Nondistended, non-tender.  No masses or organomegaly.  Extremities: No pretibial edema.     IMAGING:  1/30 - NG tube coiled overlying the proximal stomach.gaseous distention again noted.   1/25 - CTA chest - 1. Pulmonary embolus within segmental branches to the right upper lobe, left upper lobe and left lower lobe, extending distally. 2. Bilateral lower lobe airspace opacification is thought to reflect atelectasis, with trace bilateral pleural effusions. No definite evidence for aspiration. 3. Mild nonspecific left upper lobe opacities may reflect atelectasis.  1/26 - LE Dopplers - 1. Acute DVT right saphenofemoral junction, common femoral vein. 2. Acute DVT left saphenofemoral junction, left femoral vein, and mobile thrombus in the left common femoral vein.   DIAGNOSES: Principal Problem:  *Acute respiratory failure with hypercapnia Active Problems:  HYPERLIPIDEMIA  TOBACCO USE  HYPERTENSION  GERD  COPD (chronic obstructive pulmonary disease)  Acute encephalopathy  Acute kidney injury  Atrial fibrillation  PE (pulmonary embolism)  ASSESSMENT / PLAN:  PULMONARY A: 1) Acute hypercapnic respiratory failure/ARDS - secondary to flu, COPD exacerbation. Trach 1/22. 2) Aspiration - at time of intubation, finished course of treatment  3) Pneumonitis vs. CAP - b/l interstitial opacities on cxr, slightly increased at left, likely atelectasis. Completed 8/8 days rocephin , on tapering steroids  4) Pulmonary  edema - improved. 4) Non-oxygen dependent COPD - h/o chronic tobacco abuse hx 3 pack per day 20+ years.  5) Bloody secretions from aspiration - Pulm /renal syndrome ruled out. 6) Upper airway severe angioedema & rash  from protamine  7) Likley small hematoma on rt apex from neck oozing  P:  - ATC as tolerated - Duonebs q6h + q2h PRN. - Continue steroids for rash - solumedrol 40 q 24h, taper once rash resolved - Attempt PMSV again today  CARDIOVASCULAR  A:  1) New onset Afib with RVR - no longer rate controlled. Unable to take oral amiodarone. On cardizem, amio. CE neg x 3. On heparin gtt. 2) Hypotension - mild. With hx of HTN. 3) Anaphylaxis to protamine - protamine given in setting of trach placement, resultant anaphylactic reaction - resolved.  P:  - Goal HR < 120, cardizem gtt.-change to PO when able - Resume amio PO when able, may need amio IV if sustains AF  RENAL  Lab 05/08/12 0445 05/07/12 0440 05/05/12 0420 05/04/12 1517  NA 139 142 -- --  K 3.3* 3.6 -- --  CL 100 100 -- --  CO2 31 30 -- --  BUN 20 19 -- --  CREATININE 0.61 0.47* -- --  GLUCOSE 136*  132* -- --  MG -- -- 1.8 1.9  PHOS -- 2.6 2.6 --   A:  1) Acute Kidney injury - Resolved. Previously likely pre-renal in setting of volume depletion. Previously urinary sediment clogging foley, improved. Continue to monitor in setting of lasix use. 2) Hypernatremia - resolved  3) Hypokalemia - Previously in setting of lasix no in setting of NPO.  P:  - Replete IV KCl. - Goal to keep even.  GASTROINTESTINAL  Lab 05/05/12 0420 05/04/12 1517 05/02/12 0425  AST 24 18 27   ALT 33 39 37  ALKPHOS 72 77 72  BILITOT 0.9 0.8 0.8  PROT 5.6* 5.5* 6.0  ALBUMIN 2.3* 2.3* 2.3*   A:  1) GERD 2) Diarrhea - resolved. C. Diff negative. 3) Colonic Ileus - worsening ileus. 4) Paraesophageal nodes - need FU CT in 3-4 mnths P: - Continue TF, Pepcid - PEG delayed with lack of contrast, note colonic ileus - Minimize  narcotics. - Swallow eval - hope he will clear, place NG if does not  HEMATOLOGIC  Lab 05/08/12 0440 05/07/12 0440  WBC 16.1* 16.8*  HGB 15.6 15.3  HCT 46.9 48.6  MCV 92.7 92.9  PLT 241 253   A:  1) Leukocytosis -  Increased this AM, likely in setting of high dose steroids that were escalated yesterday - no fever. 3) Anticoagulation in setting of atrial fibrillation - New diagnosis. CHADS 3. Coumadin on home. On heparin gtt. 4) Bilateral PE 5) Bilateral DVT - IVC filter placement 1/27 P:  - IV heparin for VTE's - No coumadin for now until PEG  Question clarified - SCD  INFECTIOUS  Lab 05/08/12 0440 05/07/12 0440  WBC 16.1* 16.8*  NEUTROABS -- --   A: 1) Flu pneumonia - Flu A and metapneumovirus > finished Tamiflu x 10d, droplet isolation d/c 1/18 2) Aspirated at time of intubation, pneumonitis vs PNA> finished Rocephin x8d. Aspirated during reintubation.  3) Nosocomial infection - Left IJ (in 8d) dc'ed 1/13 and cath tip cultures>staph/coag neg > tx finished Vanc  4) Diarrhea - resolved. 5) Leukocytosis - likely 2/2 steroids. Ceftaz 1/18 --> 1/29 w/ sputum cx negative. Procalcitonin 0.14 --> 0.15. P: Continue to monitor for s/s of additional infection, observe off abx  ENDOCRINE  Lab 05/08/12 0752 05/08/12 0408 05/08/12 0027 05/07/12 1955 05/07/12 1550  GLUCAP 149* 116* 115* 134* 139*   A:  1) Diabetes mellitus, type 2 (A1c 7.1) - possible iatrogenic secondary to prolonged steroid use. 2) On steroids - CBGs at goal. Lantus off. P: ICU Hyperglycemic protocol  NEUROLOGIC / PSYCHIATRIC   A:  1) Acute encephalopathy - Resolved. Likely secondary to hypoxia / hypercarbia and possible infectious process (CAP?). >returned 1/18 >requiring intubation  2) Delerium - persistent, controlled on propofol and fentanyl, requiring high doses, inhibiting weaning. AVOID BZDs  P:  - Again, goal to stay off drips - Continue scheduled haldol and PRN fentanyl for now. - Swallow study -  then resume methadone 10mg , seroquel 200 bid - Monitor QTc - haldol 5 q6 standing - Does not qualify for LTAC, SNF/trach beds [place] ([place] of [place]) - PT/ OT consult. -  IV Ativan. - CIR eval - good candidate once acute issues resolved   CLINICAL SUMMARY: 38 yoM with COPD (3ppd smoking) admitted 1/5 with VDRF 2/2 influenza A and asp PNA, intubated 04/13/11 - 1/15 for AW protection, aspirated during intubation. Reintubated emergently 1/18 for severe delerium & resp distress, purulent secretions noted. Trach 1/22. New PE, DVT  BL, s/p IVF filter 1/27. On hep gtt. Ileus persistent, re-consult IR for PEG once improved. To SDU when off drips.  Signed: Johnette Abraham, D.O. PGY-III, Internal Medicine Resident  Care during the described time interval was provided by me and/or other providers on the critical care team.  I have reviewed this patient's available data, including medical history, events of note, physical examination and test results as part of my evaluation  CC time x 32 m  Harol Shabazz V.

## 2012-05-08 NOTE — PMR Pre-admission (Signed)
PMR Admission Coordinator Pre-Admission Assessment  Patient: Cody [name] is an 62 y.o., male MRN: [id number] DOB: [birth year] Height: 5\' 10"  (177.8 cm) Weight: 78.7 kg (173 lb 8 oz)              Insurance Information: Medicaid pending  Medicaid Application Date:       Case Manager:   Emergency Conservator, museum/gallery Information   Name Relation Home Work Mobile   [place] Spouse [phone]  [phone]     Current Medical History  Patient Admitting Diagnosis: Severe deconditioning and encephalopathy after CHF, pneumonia with subsequent VDRF.  History of Present Illness:61 y.o. right-handed male with history of CVA, COPD with tobacco abuse. Admitted 04/12/2012 with increasing shortness of breath x4 days as well as productive cough occasional hemoptysis and increased confusion. Arrived in the ED in respiratory distress with oxygen saturation 60% on room air placed on NRB. Patient with increasing restlessness agitation and was intubated. Chest x-ray with acute pulmonary edema or atypical pneumonia. Placed on broad-spectrum antibiotics per critical care medicine as well as intravenous Solu-Medrol. Long-term ventilatory support with tracheostomy performed 04/29/2012 per Dr. [name] and presently has a #6 cuffless trach. Echocardiogram with ejection fraction 55% without PFO identified. Lower extremity Dopplers were negative on 04/13/2012 repeat 05/03/2012 showing acute DVT right saphenofemoral junction and right common femoral vein as well as left saphenofemoral junction femoral vein as well as CT angino chest 05/02/2012 showing pulmonary embolism within segmental branches to the right upper lobe, left upper lobe and left lower lobe.. An IVC filter was placed 05/04/2012 and IV heparin/Coumadin initiated 05/07/2012. Ongoing bouts of agitation and restlessness patient has received Ativan as well as Haldol with Seroquel twice daily. Wrist restraints in place as patient with multiple  attempts to pull out tracheostomy tube . There was plan for gastrostomy tube placement for nutritional support however patient developed persistent ileus and procedure was placed on hold until 05/19/2012 with PEG tube placed per Dr. Ezzard Standing. Patient did develop new onset atrial fibrillation with RVR and resolved 05/09/2012. Patient had been on amiodarone discontinued 05/24/2012 and maintained currently on Lopressor. Physical and occupational therapy evaluations completed 05/02/2012 ongoing patient with noted profound deconditioning as well as agitation confusion.     Past Medical History  Past Medical History  Diagnosis Date  . Stroke   . Hypertension   . Hyperlipidemia   . GERD (gastroesophageal reflux disease)   . COPD (chronic obstructive pulmonary disease)    Family History  family history includes COPD in his mother.  Prior Rehab/Hospitalizations: none   Current Medications  Current facility-administered medications:0.9 %  sodium chloride infusion, , Intravenous, Continuous PRN, Zigmund Gottron, MD, Last Rate: 10 mL/hr at 05/19/12 1359;  antiseptic oral rinse (BIOTENE) solution 15 mL, 15 mL, Mouth Rinse, QID, Oretha Milch, MD, 15 mL at 05/29/12 0400;  chlorhexidine (PERIDEX) 0.12 % solution 15 mL, 15 mL, Mouth Rinse, BID, Oretha Milch, MD, 15 mL at 05/29/12 0736 clonazePAM (KLONOPIN) tablet 1 mg, 1 mg, Per Tube, BID, Alyson Reedy, MD, 1 mg at 05/28/12 2142;  feeding supplement (JEVITY 1.2 CAL) liquid 1,000 mL, 1,000 mL, Per Tube, Continuous, Merwyn Katos, MD, Last Rate: 60 mL/hr at 05/28/12 1300, 1,000 mL at 05/28/12 1300;  fentaNYL (SUBLIMAZE) injection 25 mcg, 25 mcg, Intravenous, Q2H PRN, Merwyn Katos, MD;  free water 200 mL, 200 mL, Per Tube, Q6H, Merwyn Katos, MD, 200 mL at 05/29/12 0539 Gerhardt's butt cream, , Topical, PRN, Dorise Hiss Deterding,  MD;  levalbuterol Pauline Aus) nebulizer solution 0.63 mg, 0.63 mg, Nebulization, Q6H, [name], DO, 0.63 mg  at 05/29/12 0803;  metoprolol (LOPRESSOR) injection 2.5 mg, 2.5 mg, Intravenous, Q5 min PRN, Nyoka Cowden, MD, 2.5 mg at 05/23/12 0132;  metoprolol tartrate (LOPRESSOR) tablet 25 mg, 25 mg, Oral, BID, Merwyn Katos, MD, 25 mg at 05/28/12 2112 QUEtiapine (SEROQUEL) tablet 100 mg, 100 mg, Per Tube, BID, Merwyn Katos, MD, 100 mg at 05/28/12 2113;  warfarin (COUMADIN) tablet 7.5 mg, 7.5 mg, Oral, ONCE-1800, Lovell Sheehan, [place];  Warfarin - Pharmacist Dosing Inpatient, , Does not apply, q1800, Chinita Greenland, PHARMD  Patients Current Diet:  NPO / PEG feedings  Precautions / Restrictions Precautions Precautions: Fall Precaution Comments: trach, PEG Restrictions Weight Bearing Restrictions: No   Prior Activity Level   Home Assistive Devices / Equipment Home Assistive Devices/Equipment: None Home Adaptive Equipment: Built-in shower seat  Prior Functional Level Prior Function Level of Independence: Independent Able to Take Stairs?: Yes Driving: Yes Vocation: Full time employment  Current Functional Level Cognition  Arousal/Alertness: Awake/alert Overall Cognitive Status: Impaired Difficult to assess due to: Tracheostomy Current Attention Level: Sustained;Selective Attention - Other Comments: able to select attention to ambulation but needs redirection to task as he becomes distracted  Memory Deficits: needs constant reminders of his situation and why he cannot have water (swallowing precautions) Orientation Level: Oriented to person;Oriented to place;Oriented to time Following Commands: Follows one step commands consistently;Follows multi-step commands with increased time Safety/Judgement: Impulsive;Decreased awareness of need for assistance Safety/Judgement - Other Comments: needs several reminders of why he can't have food or anythign to drink Awareness of Errors: Assistance required to correct errors made;Assistance required to identify errors made Awareness of  Deficits: needs reminders about his situation and why he not allowed to drink yet, impulsively trying to stand up without therapist there to help him needing reminders that he still needs help Cognition - Other Comments: having some hallucinations today, talking about how he wants to go with another company and saw knives in his chair    Extremity Assessment (includes Sensation/Coordination)  RUE ROM/Strength/Tone: Unable to fully assess;Due to impaired cognition RUE ROM/Strength/Tone Deficits: pt rubbing right wrist with left hand as if experiencing discomfort, but denies pain.  RLE ROM/Strength/Tone: Deficits RLE ROM/Strength/Tone Deficits: ROM WFL, pt not exhibiting AROM of bil LE but performed PROM supine marching x 5 bil LE RLE Sensation: WFL - Light Touch RLE Coordination: WFL - gross/fine motor    ADLs  Eating/Feeding: NPO Grooming: Performed;Wash/dry face;Wash/dry hands;Min guard Where Assessed - Grooming: Supported standing Upper Body Bathing: Simulated;Maximal assistance Where Assessed - Upper Body Bathing: Unsupported sitting Lower Body Bathing: Simulated;+1 Total assistance Where Assessed - Lower Body Bathing: Unsupported sitting Upper Body Dressing: Performed;Maximal assistance Where Assessed - Upper Body Dressing: Supine, head of bed up Lower Body Dressing: Performed;Minimal assistance (donned ) Where Assessed - Lower Body Dressing: Unsupported sitting Toilet Transfer: Simulated;Minimal assistance Toilet Transfer: Patient Percentage: 50% Toilet Transfer Method: Sit to stand (ambulating) Acupuncturist:  (bed) Toileting - Clothing Manipulation and Hygiene: Moderate assistance Where Assessed - Toileting Clothing Manipulation and Hygiene: Sit to stand from 3-in-1 or toilet Equipment Used: Gait belt;Rolling walker Transfers/Ambulation Related to ADLs: Pt ambulated to bathroom with min assist for grooming ADLs and then ~20 ft through room to door with min assist and  sitting rest break x2. ADL Comments: Pt stood at sink with min guard and leaned anteriorly with bil UE supported on countertop  of sink while washing face.  Pt independently reached for towel and cleaned glasses without cueing or assist from therapist.  Min assist to don right sock (pt with difficulty crossing right ankle over knee), no assist required to don left sock (close guarding for safety with dynamic sitting EOB).     Mobility  Bed Mobility: Rolling Left;Left Sidelying to Sit Rolling Right: 4: Min assist Rolling Right: Patient Percentage: 10% Rolling Left: 4: Min assist Rolling Left: Patient Percentage: 10% Left Sidelying to Sit: 4: Min assist Left Sidelying to Sit: Patient Percentage: 0% Supine to Sit: 4: Min assist;HOB flat;With rails Supine to Sit: Patient Percentage: 10% Sitting - Scoot to Edge of Bed: 4: Min assist Sitting - Scoot to [place] of Bed: Patient Percentage: 20% Sit to Supine: 4: Min assist;HOB flat Sit to Supine: Patient Percentage: 20% Sit to Sidelying Left: 3: Mod assist Scooting to HOB: 1: +2 Total assist Scooting to [facility]: Patient Percentage: 40%    Transfers  Transfers: Sit to Stand;Stand to Sit Sit to Stand: 4: Min assist;From bed;From chair/3-in-1;With armrests;With upper extremity assist Sit to Stand: Patient Percentage:  (50-60%) Stand to Sit: 4: Min assist;To chair/3-in-1;With armrests;With upper extremity assist Stand to Sit: Patient Percentage: 60% Stand Pivot Transfers: 1: +2 Total assist (bed->chair (to the right)) Stand Pivot Transfers: Patient Percentage: 30%    Ambulation / Gait / Stairs / Wheelchair Mobility  Ambulation/Gait Ambulation/Gait Assistance: 4: Min assist Ambulation/Gait: Patient Percentage: 60% Ambulation Distance (Feet): 30 Feet (15 ft x2) Assistive device: Rolling walker Ambulation/Gait Assistance Details: cues/facilitation for tall posture as well as safety cues with regards to use of RW, impulsivity and safety with turns;  ambulated 15 ft x2 Gait Pattern: Trunk flexed;Shuffle;Narrow base of support General Gait Details: decreased step height Stairs: No Wheelchair Mobility Wheelchair Mobility: No    Posture / Balance Static Sitting Balance Static Sitting - Balance Support: No upper extremity supported;Feet supported Static Sitting - Level of Assistance: 5: Stand by assistance Static Sitting - Comment/# of Minutes: sat EOB, cues for tall posture and anterior pelvic tilt Dynamic Sitting Balance Dynamic Sitting - Balance Support: Feet supported;During functional activity Dynamic Sitting - Level of Assistance:  (min guard) Dynamic Sitting Balance - Compensations: close guarding for safety while donning socks Dynamic Sitting - Balance Activities: Reaching for objects;Forward lean/weight shifting Dynamic Sitting - Comments: trunk rotational activities Static Standing Balance Static Standing - Balance Support: Bilateral upper extremity supported Static Standing - Level of Assistance: 4: Min assist Static Standing - Comment/# of Minutes: stood x4 today, stood at the sink with flexed posture resting on elbow due to fatigue Dynamic Standing Balance Dynamic Standing - Balance Support: Left upper extremity supported Dynamic Standing - Level of Assistance: 4: Min assist Dynamic Standing - Balance Activities: Reaching for objects;Lateral lean/weight shifting;Forward lean/weight shifting Dynamic Standing - Comments: pt performing grooming activities at the sink using RUE (still needing LUE for support); anterior and lateral weight shifts for improved balance, tends to flex forward to rest arms on sink due to fatigue High Level Balance High Level Balance Activites: Backward walking High Level Balance Comments: mod stability assist with backward walking 5 ft using RW    Special needs/care consideration  Continuous Drip IV: IV fluids for IV drips Oxygen: 40% via trach Trach Size: Shiley 4, using PMSV with ST Skin:  Eczema on legs, sacrum reddeded Bowel mgmt: Rectal tube in use Bladder mgmt: Incontinent at times. Stands to urinate. Diet: NPO with PEG feedings Diabetic mgmt: No  Previous Home Environment Living Arrangements: Spouse/significant other Lives With: Spouse Available Help at Discharge: Family;Available 24 hours/day Type of Home: Mobile home Home Layout: One level Home Access: Stairs to enter Entrance Stairs-Rails: Doctor, general practice of Steps: 3 Bathroom Shower/Tub: Health visitor: Standard Bathroom Accessibility: Yes How Accessible: Accessible via walker Home Care Services: No  Discharge Living Setting Plans for Discharge Living Setting: Patient's home Type of Home at Discharge: House Discharge Home Layout: One level Discharge Home Access: Stairs to enter Entrance Stairs-Rails: None Entrance Stairs-Number of Steps: 3 Discharge Bathroom Shower/Tub: Walk-in shower Discharge Bathroom Toilet: Standard Discharge Bathroom Accessibility: Yes How Accessible: Accessible via walker Do you have any problems obtaining your medications?: Yes (Medicaid pending)  Social/Family/Support Systems Patient Roles: Partner;Parent;Caregiver -- Pt lives with partner and they are raising 3 granddaughters. Also has a daughter and son. Contact Information: [phone] Anticipated Caregiver: Karin Lieu (Pt's partner) and 3 teenaged granddaughters. Anticipated Caregiver's Contact Information: [phone] Ability/Limitations of Caregiver: none Caregiver Availability: Pt's partner just got a job. She plans to work at night. Granddaughters home at night. Discharge Plan Discussed with Primary Caregiver: Yes Is Caregiver In Agreement with Plan?: Yes Does Caregiver/Family have Issues with Lodging/Transportation while Pt is in Rehab?: No  Goals/Additional Needs Patient/Family Goal for Rehab: PT &OT: supervision - min assist. ST: supervision to modified independent. Expected  length of stay: 2-3 weeks Cultural Considerations: none Equipment Needs: TBD Pt/Family Agrees to Admission and willing to participate: Yes Program Orientation Provided & Reviewed with Pt/Caregiver Including Roles  & Responsibilities: Yes  Decrease burden of Care through IP rehab admission: Decannulation, Diet advancement, Decrease number of caregivers, Bowel and bladder program, Patient/family education,  Trach care, PEG feedings  Possible need for SNF placement upon discharge: Not planned, but possible.  Patient Condition: This patient's medical and functional status has changed since the consult dated:05/07/12 . Today, 05/29/12, patient's attending MD, Dr Sung Amabile,  reports patient is stable for d/c to CIR. Dr Riley Kill evaluated the patient today and reports patient is appropriate for inpatient rehab. Will admit to inpatient rehab today.  Preadmission Screen Completed By:  Meryl Dare, 05/29/2012 10:25 AM ______________________________________________________________________   Discussed status with Dr. Riley Kill on 05/29/12 at 0900 and received telephone approval for admission today.  Admission Coordinator:  Meryl Dare, time 11:06 AM / Date 05/29/12

## 2012-05-08 NOTE — Progress Notes (Signed)
Visited patient at bedside. Pt's MD present and reports pt will likely be ready to d/c to inpatient rehab early next week. Will follow-up with pt when family is present to discuss CIR. Please call [phone] for questions.

## 2012-05-08 NOTE — Procedures (Signed)
Objective Swallowing Evaluation: Fiberoptic Endoscopic Evaluation of Swallowing  Patient Details  Name: Cody [name] MRN: [id number] Date of Birth: 10-Feb-1951  Today's Date: 05/08/2012 Time: 4098-1191 SLP Time Calculation (min): 30 min  Past Medical History:  Past Medical History  Diagnosis Date  . Stroke   . Hypertension   . Hyperlipidemia   . GERD (gastroesophageal reflux disease)   . COPD (chronic obstructive pulmonary disease)    Past Surgical History: History reviewed. No pertinent past surgical history. HPI:  Mr. Starliper is a 62 year old white male with PMH of COPD, 3 pack per day smoking history 20+ years, and HTN presenting to the ED 1/5 with complaints of worsening shortness of breath x4 days. History obtained by fiance. Mr. Amparo has baseline shortness of breath especially on exertion, however, for the past 4 days he has been having increased difficulty and has also developed a worsening productive cough with white sputum and occasionally streaked with bright red blood. Fiance reports increasing confusion and extreme light headedmess when standing. She denied him to have any headaches, fevers, LOC, N/V/D, chest pain, abdominal pain, or any urinary complaints. Diagnosed with potential influenza with aspiration PNA (? during intubation). Intubated due to respiratory distress 1/5. Extubated 1/15.  Initial BSE performed 04/23/12, placed on po diet and discharged from ST.  Pt. was subsequently reintubated due to respiratory distress, self extubated 1/21 and reintubated same day.  Trach placed 1/22.  FEES performed with PMSV donned.     Assessment / Plan / Recommendation Clinical Impression  Dysphagia Diagnosis: Mild oral phase dysphagia;Severe pharyngeal phase dysphagia Clinical impression: Pt. lethargic but stimulable for increased alertness for participation during FEES with PMSV donned.  Scope moderately difficult to pass due to what appeared to be significant pharyngeal edema  likely resulting from prolonged intubation.  Pharyngeal impairments are motor based with reduced tongue base retraction, decreased laryngeal elevation leading to mod-severe pyriform sinus residue spilling into trachea after and during swallows.  Decreased sensation evident as pt. produced slight and ineffective throat clears.  Pt. is not safe to consume po's at this time with alternate means of nutrition recommended although prognosis is good as strength and endurance improve.  ST will continue to follow for pharyngeal strengthening and with PMSV.    Treatment Recommendation  Therapy as outlined in treatment plan below    Diet Recommendation Alternative means - temporary;NPO        Other  Recommendations Oral Care Recommendations: Oral care BID   Follow Up Recommendations  Inpatient Rehab    Frequency and Duration min 2x/week  2 weeks   Pertinent Vitals/Pain Non indications    SLP Swallow Goals Patient will utilize recommended strategies during swallow to increase swallowing safety with: Supervision/safety;Moderate assistance;Moderate cueing Swallow Study Goal #2 - Progress: Discontinued (comment) (pt. NPO) Goal #3: Pt. will perform exercises for improved tongue base and laryngeal ROM and strength with max visual/verbal/tactile cues.      Reason for Referral Objectively evaluate swallowing function   Oral Phase Oral Preparation/Oral Phase Oral Phase: Impaired Oral - Solids Oral - Puree: Weak lingual manipulation;Delayed oral transit   Pharyngeal Phase Pharyngeal Phase Pharyngeal Phase: Impaired Pharyngeal - Solids Pharyngeal - Puree: Pharyngeal residue - valleculae;Pharyngeal residue - pyriform sinuses;Penetration/Aspiration during swallow;Penetration/Aspiration after swallow;Reduced airway/laryngeal closure;Reduced laryngeal elevation;Reduced anterior laryngeal mobility;Reduced epiglottic inversion;Reduced tongue base retraction Penetration/Aspiration details (puree): Material  enters airway, CONTACTS cords and not ejected out  Cervical Esophageal Phase    GO  Breck Coons [place].Ed [employer] [phone]  05/08/2012

## 2012-05-08 NOTE — Progress Notes (Signed)
Trach collar had secretions in it so RT changed out trach collar mask.

## 2012-05-08 NOTE — ED Provider Notes (Signed)
Patient required intubation. This was done by Dr. Gary Fleet, resident who was working under my direct supervision. His original note had been completed so this is a re-creation of the intubation procedure note.   INTUBATION Performed by: ZOXWR,UEAVW  Required items: required blood products, implants, devices, and special equipment available Patient identity confirmed: provided demographic data and hospital-assigned identification number Time out: Immediately prior to procedure a "time out" was called to verify the correct patient, procedure, equipment, support staff and site/side marked as required.  Indications: Acute respiratory failure   Intubation method: Glidescope   Preoxygenation: BVM  Sedatives: Etomidate Paralytic: Succinylcholine  Tube Size:  7.5 cuffed  Post-procedure assessment: chest rise and ETCO2 monitor Breath sounds: equal and absent over the epigastrium Tube secured with: ETT holder Chest x-ray interpreted by radiologist and me.  Chest x-ray findings: endotracheal tube in appropriate position  Patient tolerated the procedure well with no immediate complications.     Dione Booze, MD 05/08/12 2308

## 2012-05-08 NOTE — Progress Notes (Signed)
Name: Cody [name] MRN: [id number] DOB: 03/19/1951  ELECTRONIC ICU PHYSICIAN NOTE  Problem:  Rapid afib on cardizem drip and amiodarone  Intervention:  Add prn iv lopressor, max cardizem at 20 mg / h, add fluid boluses if needed  Sandrea Hughs 05/08/2012, 5:26 AM

## 2012-05-08 NOTE — Progress Notes (Addendum)
Wasted 100 ml fentanyl in sink.

## 2012-05-08 NOTE — Progress Notes (Signed)
ANTICOAGULATION CONSULT NOTE  Pharmacy Consult for heparin Indication: pulmonary embolus and DVT  Allergies  Allergen Reactions  . Protamine Anaphylaxis   Patient Measurements: Height: 5\' 10"  (177.8 cm) Weight: 202 lb 13.2 oz (92 kg) IBW/kg (Calculated) : 73  Heparin Dosing Weight: 92 kg  Vital Signs: Temp: 98.3 F (36.8 C) (01/31 0745) Temp src: Oral (01/31 0745) BP: 120/81 mmHg (01/31 0745) Pulse Rate: 104  (01/31 0745)  Labs:  Basename 05/08/12 0445 05/08/12 0440 05/07/12 0440 05/06/12 0445 05/06/12 0430  HGB -- 15.6 15.3 -- --  HCT -- 46.9 48.6 -- 48.9  PLT -- 241 253 -- 226  APTT -- -- -- -- --  LABPROT -- -- -- -- --  INR -- -- -- -- --  HEPARINUNFRC -- 0.63 0.57 -- 0.48  CREATININE 0.61 -- 0.47* 0.54 --  CKTOTAL -- -- -- -- --  CKMB -- -- -- -- --  TROPONINI -- -- -- -- --    Estimated Creatinine Clearance: 107.7 ml/min (by C-G formula based on Cr of 0.61).  Assessment: 62 yo male with h/o atrial fibrillation, now with new DVT and PE, for heparin. Heparin currently therapuetic (HL=0.63), CBC stable, no recent bleeding. Post IVC filter 1/27.  Rapid afib overnight  Goal of Therapy:  Heparin level 0.3-0.7 units/ml Monitor platelets by anticoagulation protocol: Yes   Plan:  -Continue 2400 units/hr  -Follow up with morning heparin level.  Sheppard Coil, Pharm D 05/08/2012 8:37 AM

## 2012-05-08 NOTE — Progress Notes (Signed)
Clinical Social Work  CSW continues to follow patient to assist with dc planning and provide support. Girlfriend appreciative of visit and reports that she is optimistic for patient's recovery. CSW spoke with girlfriend regarding CIR vs SNF. No confirmed SNF offers but 3 places considering at this time. CSW will continue to follow.  [place], [place] [phone]

## 2012-05-08 NOTE — Evaluation (Signed)
Clinical/Bedside Swallow Evaluation Patient Details  Name: Cody [name] MRN: [id number] Date of Birth: December 17, 1950  Today's Date: 05/08/2012 Time: 7829-5621 SLP Time Calculation (min): 20 min  Past Medical History:  Past Medical History  Diagnosis Date  . Stroke   . Hypertension   . Hyperlipidemia   . GERD (gastroesophageal reflux disease)   . COPD (chronic obstructive pulmonary disease)    Past Surgical History: History reviewed. No pertinent past surgical history. HPI:  Mr. Cody [name] is a 62 year old white male with PMH of COPD, 3 pack per day smoking history 20+ years, and HTN presenting to the ED 1/5 with complaints of worsening shortness of breath x4 days. History obtained by fiance. Mr. Cody [name] has baseline shortness of breath especially on exertion, however, for the past 4 days he has been having increased difficulty and has also developed a worsening productive cough with white sputum and occasionally streaked with bright red blood. Fiance reports increasing confusion and extreme light headedmess when standing. She denied him to have any headaches, fevers, LOC, N/V/D, chest pain, abdominal pain, or any urinary complaints. Diagnosed with potential influenza with aspiration PNA (? during intubation). Intubated due to respiratory distress 1/5. Extubated 1/15.  Initial BSE performed 04/23/12, placed on po diet and discharged from ST.  Pt. was subsequently reintubated due to respiratory distress, self extubated 1/21 and reintubated same day.  Trach placed 1/22.  Today's BSE performed with his PMSV donned.   Assessment / Plan / Recommendation Clinical Impression  PMSV donned during swallow assessment.  S/S aspiration with ice chip and tsp sip water present.  Given clinical observations, decreased endurance and prolonged intubation, recommend objective assessment with FEES to be performed today.    Aspiration Risk  Severe    Diet Recommendation   to be determined       Other   Recommendations Recommended Consults: FEES Oral Care Recommendations: Oral care BID   Follow Up Recommendations  Inpatient Rehab    Frequency and Duration        Pertinent Vitals/Pain Pt. denied       Swallow Study         Oral/Motor/Sensory Function Overall Oral Motor/Sensory Function: Impaired Labial ROM: Reduced right;Reduced left (generalized weakness) Labial Symmetry: Within Functional Limits Labial Strength: Reduced Lingual ROM: Reduced right Lingual Symmetry: Abnormal symmetry left (deviation to left) Lingual Strength: Reduced Facial ROM: Within Functional Limits Facial Symmetry: Within Functional Limits Mandible: Within Functional Limits   Ice Chips Ice chips: Impaired Pharyngeal Phase Impairments: Cough - Delayed   Thin Liquid Thin Liquid: Impaired Presentation: Spoon Pharyngeal  Phase Impairments: Multiple swallows;Cough - Immediate;Cough - Delayed    Nectar Thick Nectar Thick Liquid: Not tested   Honey Thick Honey Thick Liquid: Not tested   Puree Puree: Not tested   Solid       Solid: Not tested       Royce Macadamia M.Ed [employer] [phone]  05/08/2012

## 2012-05-08 NOTE — Progress Notes (Signed)
Patient ID: Cody [name], male   DOB: 1950-07-03, 62 y.o.   MRN: [id number]   Pt for eventual perc gastric tube  Continued ileus on abd films 1/30 Will follow  IR PA will check over weekend; possible  plan for G tube Mon/Tues of next week Depending on films and status

## 2012-05-08 NOTE — Evaluation (Signed)
Occupational Therapy Evaluation Patient Details Name: Cody [name] MRN: [id number] DOB: 1951/02/03 Today's Date: 05/08/2012 Time: 4782-9562 OT Time Calculation (min): 35 min  OT Assessment / Plan / Recommendation Clinical Impression  Pt admitted SOB and resp fail due to flu and PNA with VDRF 1/15, 1/18-1/21 with trach and brochonscopy 1/22. Pt will continue to benefit from acute OT services to address below problem list.  Recommending CIR to further maximize independence and safety with ADLs before eventual return home.    OT Assessment  Patient needs continued OT Services    Follow Up Recommendations  CIR    Barriers to Discharge None    Equipment Recommendations   (tbd)    Recommendations for Other Services Rehab consult  Frequency  Min 2X/week    Precautions / Restrictions Precautions Precautions: Fall Precaution Comments: trach Restrictions Weight Bearing Restrictions: No   Pertinent Vitals/Pain HR a-fib on cardizem drip fluctuating between 100-130s. MD aware    ADL  Grooming: Performed;Moderate assistance Where Assessed - Grooming: Unsupported sitting Upper Body Bathing: Simulated;Maximal assistance Where Assessed - Upper Body Bathing: Unsupported sitting Lower Body Bathing: Simulated;+1 Total assistance Where Assessed - Lower Body Bathing: Unsupported sitting Upper Body Dressing: Simulated;Maximal assistance Where Assessed - Upper Body Dressing: Unsupported sitting Lower Body Dressing: Performed;+1 Total assistance Where Assessed - Lower Body Dressing: Unsupported sitting Transfers/Ambulation Related to ADLs: Multiple attempts to perform sit<>stand but pt resisting. ADL Comments: Pt sat EOB 10 minutes with cues to remain seated (attempting to stand while therapist managing lines in prep for functional transfer).  When attempting to perform sit<>stand, pt resisting and refusing to stand (1 attempt with sara lift).  Pt reporting he doesn't want to get up today  (although attempting to stand from bed without therapist assist).      OT Diagnosis: Cognitive deficits;Generalized weakness  OT Problem List: Decreased strength;Decreased activity tolerance;Impaired balance (sitting and/or standing);Decreased cognition;Decreased safety awareness;Decreased knowledge of use of DME or AE;Cardiopulmonary status limiting activity OT Treatment Interventions: Self-care/ADL training;DME and/or AE instruction;Therapeutic activities;Cognitive remediation/compensation;Patient/family education;Balance training   OT Goals Acute Rehab OT Goals OT Goal Formulation: Patient unable to participate in goal setting Time For Goal Achievement: 05/22/12 Potential to Achieve Goals: Fair ADL Goals Pt Will Perform Grooming: with supervision;Sitting, chair;Sitting, edge of bed;Unsupported ADL Goal: Grooming - Progress: Goal set today Pt Will Perform Upper Body Bathing: with supervision;Sitting, chair;Sitting, edge of bed;Unsupported ADL Goal: Upper Body Bathing - Progress: Goal set today Pt Will Perform Lower Body Bathing: with mod assist;Sitting, chair;Sitting, edge of bed;Unsupported ADL Goal: Lower Body Bathing - Progress: Goal set today Pt Will Transfer to Toilet: with mod assist;Ambulation;with DME;Comfort height toilet ADL Goal: Toilet Transfer - Progress: Goal set today Miscellaneous OT Goals Miscellaneous OT Goal #2: Pt will perform bed mobility with min assist as precursor for EOB ADL retraining. OT Goal: Miscellaneous Goal #2 - Progress: Goal set today Miscellaneous OT Goal #3: Pt will consistently follow one step commands during 75% of therapy session. OT Goal: Miscellaneous Goal #3 - Progress: Goal set today  Visit Information  Last OT Received On: 05/08/12 Assistance Needed: +2 PT/OT Co-Evaluation/Treatment: Yes    Subjective Data      Prior Functioning     Home Living Lives With: Spouse Available Help at Discharge: Family;Available 24 hours/day Type of  Home: Mobile home Home Access: Stairs to enter Entrance Stairs-Number of Steps: 3 Entrance Stairs-Rails: Right;Left Home Layout: One level Bathroom Shower/Tub: Health visitor: Standard Bathroom Accessibility: Yes How Accessible: Accessible via  walker Home Adaptive Equipment: Built-in shower seat Prior Function Level of Independence: Independent Able to Take Stairs?: Yes Driving: Yes Vocation: Full time employment Communication Communication: Passy-Muir valve         Vision/Perception     Cognition  Overall Cognitive Status: Difficult to assess Area of Impairment: Safety/judgement;Following commands;Attention;Awareness of errors;Awareness of deficits Difficult to assess due to: Tracheostomy (difficult to understand with passy muir) Arousal/Alertness: Awake/alert Behavior During Session: Flat affect Current Attention Level: Sustained Attention - Other Comments: fluctuated between focused and sustained today, pt fatigued and tends to zone out (it is almost like he hears you but is ignoring you on purpose) Following Commands: Follows one step commands inconsistently;Follows one step commands with increased time Safety/Judgement: Impulsive;Decreased awareness of need for assistance;Decreased safety judgement for tasks assessed Awareness of Errors: Assistance required to identify errors made;Assistance required to correct errors made Cognition - Other Comments: Sat EOB and initially wanted to try standing (was trying to stand without appropriate assist and cords were not organized) but when it came time to stand with therapy pt refusing and resisting even with encouragement from nursing    Extremity/Trunk Assessment Right Upper Extremity Assessment RUE ROM/Strength/Tone: Unable to fully assess;Due to impaired cognition RUE ROM/Strength/Tone Deficits: pt rubbing right wrist with left hand as if experiencing discomfort, but denies pain. Left Upper Extremity  Assessment LUE ROM/Strength/Tone: Unable to fully assess;Due to impaired cognition     Mobility Bed Mobility Bed Mobility: Supine to Sit;Sitting - Scoot to Edge of Bed Supine to Sit: 1: +2 Total assist Supine to Sit: Patient Percentage: 50% Sitting - Scoot to Edge of Bed: 2: Max assist Details for Bed Mobility Assistance: pt sat up well today with sequencing cues and faciliatory cues throughout for ease of transition  Transfers Details for Transfer Assistance: Attempted sit<>stand x5 (one of which was with the sara lift); pt initiated anterior translation of trunk but prior to lift of pt extended and resisted movement, unable to clear hips from bed today and pt appears to be refusing     Shoulder Instructions     Exercise    Balance Static Sitting Balance Static Sitting - Balance Support: Bilateral upper extremity supported Static Sitting - Level of Assistance: 4: Min assist;5: Stand by assistance Static Sitting - Comment/# of Minutes: sat EOB at least 10 minutes today needing min tactile cues for safety (leans forward, difficult to tell what he was trying to do)   End of Session OT - End of Session Activity Tolerance: Patient limited by fatigue Patient left: in bed;with call bell/phone within reach;with nursing in room Nurse Communication: Mobility status  GO    05/08/2012 Cipriano Mile OTR/L Pager [phone] Office [phone]  Cipriano Mile 05/08/2012, 5:02 PM

## 2012-05-08 NOTE — Progress Notes (Signed)
Physical Therapy Treatment Patient Details Name: Cody [name] MRN: [id number] DOB: September 17, 1950 Today's Date: 05/08/2012 Time: 4098-1191 PT Time Calculation (min): 35 min  PT Assessment / Plan / Recommendation Comments on Treatment Session  Adm. SOB and resp fail due to flu and PNA with VDRF 1/15, 1/18-1/21 with trach and brochonscopy 1/22. Bed mobility improved but could not get pt engaged enough to stand today.     Follow Up Recommendations  CIR     Does the patient have the potential to tolerate intense rehabilitation     Barriers to Discharge        Equipment Recommendations       Recommendations for Other Services    Frequency Min 3X/week   Plan Discharge plan remains appropriate;Frequency remains appropriate    Precautions / Restrictions Precautions Precautions: Fall Precaution Comments: trach Restrictions Weight Bearing Restrictions: No   Pertinent Vitals/Pain Trach collar SaO2 91% throughout session    Mobility  Bed Mobility Bed Mobility: Supine to Sit;Sitting - Scoot to Edge of Bed Supine to Sit: 1: +2 Total assist Supine to Sit: Patient Percentage: 50% Sitting - Scoot to Edge of Bed: 2: Max assist (with pad) Details for Bed Mobility Assistance: pt sat up well today with sequencing cues and faciliatory cues throughout for ease of transition  Transfers Transfers: Sit to Stand;Stand to Sit Details for Transfer Assistance: Attempted sit<>stand x5 (one of which was with the sara lift); pt initiated anterior translation of trunk but prior to lift of pt extended and resisted movement, unable to clear hips from bed today and pt appears to be refusing    Exercises General Exercises - Lower Extremity Ankle Circles/Pumps: AAROM;Both;5 reps (cues for engagement) Quad Sets: Both;5 reps;AAROM (cues for engagement)    PT Goals Acute Rehab PT Goals PT Goal: Rolling Supine to Right Side - Progress: Progressing toward goal PT Goal: Rolling Supine to Left Side - Progress:  Progressing toward goal PT Goal: Supine/Side to Sit - Progress: Progressing toward goal PT Goal: Sit at Edge Of Bed - Progress: Progressing toward goal PT Goal: Sit to Supine/Side - Progress: Progressing toward goal  Visit Information  Last PT Received On: 05/08/12 Assistance Needed: +2    Subjective Data      Cognition  Overall Cognitive Status: Difficult to assess Area of Impairment: Safety/judgement;Following commands;Attention;Awareness of errors;Awareness of deficits Difficult to assess due to: Tracheostomy Arousal/Alertness: Awake/alert Behavior During Session: Flat affect Current Attention Level: Sustained Attention - Other Comments: fluctuated between focused and sustained today, pt fatigued and tends to zone out (it is almost like he hears you but is ignoring you on purpose) Following Commands: Follows one step commands inconsistently;Follows one step commands with increased time Safety/Judgement: Impulsive;Decreased awareness of need for assistance;Decreased safety judgement for tasks assessed Awareness of Errors: Assistance required to identify errors made;Assistance required to correct errors made Cognition - Other Comments: Sat EOB and initially wanted to try standing (was trying to stand without appropriate assist and cords were not organized) but when it came time to stand with therapy pt refusing and resisting even with encouragement from nursing    Balance  Static Sitting Balance Static Sitting - Balance Support: Bilateral upper extremity supported Static Sitting - Level of Assistance: 4: Min assist;5: Stand by assistance Static Sitting - Comment/# of Minutes: sat EOB at least 10 minutes today needing min tactile cues for safety (leans forward, difficult to tell what he was trying to do)  End of Session PT - End of Session Equipment  Utilized During Treatment: Gait belt Activity Tolerance: Patient tolerated treatment well Patient left: in bed;with call bell/phone  within reach Nurse Communication: Mobility status   GP     [facility] HELEN 05/08/2012, 3:51 PM

## 2012-05-09 DIAGNOSIS — I2699 Other pulmonary embolism without acute cor pulmonale: Secondary | ICD-10-CM

## 2012-05-09 DIAGNOSIS — N289 Disorder of kidney and ureter, unspecified: Secondary | ICD-10-CM

## 2012-05-09 HISTORY — DX: Other pulmonary embolism without acute cor pulmonale: I26.99

## 2012-05-09 LAB — CBC
HCT: 43.4 % (ref 39.0–52.0)
MCH: 30 pg (ref 26.0–34.0)
MCHC: 33.2 g/dL (ref 30.0–36.0)
MCV: 90.4 fL (ref 78.0–100.0)
RDW: 16.2 % — ABNORMAL HIGH (ref 11.5–15.5)

## 2012-05-09 LAB — BASIC METABOLIC PANEL
BUN: 19 mg/dL (ref 6–23)
Creatinine, Ser: 0.55 mg/dL (ref 0.50–1.35)
GFR calc Af Amer: >90 mL/min (ref >90)
GFR calc non Af Amer: >90 mL/min (ref >90)
Glucose, Bld: 112 mg/dL — ABNORMAL HIGH (ref 70–99)

## 2012-05-09 LAB — HEPARIN LEVEL (UNFRACTIONATED): Heparin Unfractionated: 0.58 IU/mL (ref 0.30–0.70)

## 2012-05-09 LAB — CATH TIP CULTURE

## 2012-05-09 LAB — GLUCOSE, CAPILLARY: Glucose-Capillary: 123 mg/dL — ABNORMAL HIGH (ref 70–99)

## 2012-05-09 MED ORDER — POTASSIUM CHLORIDE 10 MEQ/50ML IV SOLN
10.0000 meq | INTRAVENOUS | Status: AC
  Administered 2012-05-09 (×6): 10 meq via INTRAVENOUS
  Filled 2012-05-09: qty 300

## 2012-05-09 NOTE — Progress Notes (Signed)
Patient ID: Cody [name], male   DOB: 12/04/1950, 62 y.o.   MRN: [id number]   Eventual Perc G tube  Pt with critical medical issues Wbc 13.2  afeb  Will re eval pt Mon 2/3 for poss G tube soon

## 2012-05-09 NOTE — Progress Notes (Signed)
PULMONARY  / CRITICAL CARE MEDICINE  Name: Cody [name] MRN: [id number] DOB: 03-28-1951    LOS: 27  REFERRING MD:   EDP - Dr. Preston Fleeting  CHIEF COMPLAINT:  Shortness of breath  BRIEF PATIENT DESCRIPTION: 5 yoM with COPD (3ppd smoking) admitted 1/5 with VDRF 2/2 influenza A and asp PNA, intubated 04/13/11 - 1/15 for AW protection, aspirated during intubation. Reintubated emergently 1/18 for severe delerium & resp distress, purulent secretions noted. Trach 1/22. New PE, DVT BL, s/p IVF filter 1/27. On hep gtt.   LINES / TUBES: ETT 1/5 >>1/15 , 1/18 >> self extubated 1/21 >>> reintubated 1/21 L IJ 1/5 >> 1/13 R IJ 1/13 >> 1/16 RIJ 1/18 >> 1/28 R A-line 1/5 >>1/8 pulled out by pt Trach 1/22 IVC Filter 1/27 >> PICC 1/28 >>  CULTURES: Blood culture 1/5>> Negative Resp culture 1/6>> H. influenzae  Resp viral panel 1/6>> Influenza A and Metapneumovirus  Sputum Leigonella Culture 1/6>> Negative Bronch Sputum 1/6>> H. influenzae  Flu 1/5>> Negative Blood culture 01/13 >> Negative Catheter tip culture 1/13 >>Staph -coag neg-70 colonies(vanc sens)  C.diff PCR 1/14 >> Negative Resp culture 1/18 >>> Negative  ANTIBIOTICS: Zosyn 1/5>>1/9 - restarted 1/22 >>1/28 Azithro 1/5>>1/8  Vanc 1/6>>>1/8 - restarted 1/13 >>1/25 Tamiflu 1/6>>>1/16  Rocephin 1/9>>1/15 Ceftazidine 1/18 >>1/22   SIGNIFICANT EVENTS:  1/5 Intubated in ED  1/5 Shock , levophed  1/6 ECHO - 55%, mod dil rv, PA pressures>>> not measured b/c no TR jet  1/6 Bronched- diffuse pus all lobes  1/7 increasing pCO2, increased RR on vent  1/8 Changed sedation to Precedex, more agitated than with versed  1/9 Changed sedation back to Versed, agitation greatly improved  1/10 scheduled haldol  1/12 Restarting precedex gtt due to persistent agitation 1/13 Febrile to 102.6 overnight. ? Catheter infection? DC'ed L IJ, placed new R IJ. 1/14 No fevers overnight, agitation controlled on propofol (added 1/13), more interactive. 1/15 Had  foul smelling stools overnight, therefore, C.diff PCR was sent by RN. 1/15 EXTUBATED, developed A.fib with RVR overnight. 1/16 Persistent episodes of desaturation., afib>started on cardizem drip  1/17 increased agitation  1/18 reintubated  1/20 Persistent A. Fib this morning on dilt drip.  Rates controlled between 99-125 1/21 Self extubated, given a chance and continued to be in respiratory distress so reintubated.  1/22 Tracheostomy, complicated by bleeding, angio edema from protamine 1/23 Continued agitation. 1/23 Neg acute ct head 1/24 PEG placement planned --> delayed bc of ileus 1/25 CTA showing PE BL --> started on heparin gtt. 1/26 BL LE DVT with mobile thrombus 1/27 IVC Filter placement. 1/28 PICC Line 1/29 Persistent ileus per abdominal x-ray. Attempting to limit narcotics. And 1/30 Tolerated PMSV placement for 15 minutes and fatigue and    LEVEL OF CARE:  ICU PRIMARY SERVICE:  PCCM CONSULTANTS:  None CODE STATUS: Full DIET:  TF on hold as repeatedly pulling tubes out. DVT Px:  Heparin gtt GI Px:  Protonix   SUBJECTIVE / INTERVAL HISTORY:  Patient indicates no pain, nausea, vomiting, shortness of breath  Interval Events: - Did TC all night - difficulty with PMV - failed FEES 1/31  VITAL SIGNS: Temp:  [97.5 F (36.4 C)-98.7 F (37.1 C)] 97.9 F (36.6 C) (02/01 0757) Pulse Rate:  [58-91] 91  (02/01 0923) Resp:  [15-25] 20  (02/01 0923) BP: (112-160)/(75-97) 128/79 mmHg (02/01 0923) SpO2:  [87 %-97 %] 95 % (02/01 0923) FiO2 (%):  [60 %] 60 % (02/01 0923) Weight:  [91.4 kg (201  lb 8 oz)] 91.4 kg (201 lb 8 oz) (02/01 0438) HEMODYNAMICS: VENTILATOR SETTINGS: Vent Mode:  [-]  FiO2 (%):  [60 %] 60 % INTAKE / OUTPUT:  Intake/Output Summary (Last 24 hours) at 05/09/12 1041 Last data filed at 05/09/12 0700  Gross per 24 hour  Intake 1292.5 ml  Output   1495 ml  Net -202.5 ml    PHYSICAL EXAMINATION: General: Tracheostomy in place, on ventilator, opens eyes  to command and tracks around the room. Follows commands much more consistently.  Head: Normocephalic, Trach in place, no bleeding.  Lungs:  Diminished BS, coarse breath sounds clearing.Marland Kitchen  Heart: Irregularly irregular rhythm, regular rate.  Abdomen:  BS normoactive. Soft, Nondistended, non-tender.  No masses or organomegaly.  Extremities: No pretibial edema.     IMAGING:  1/30 - NG tube coiled overlying the proximal stomach.gaseous distention again noted.   1/25 - CTA chest - 1. Pulmonary embolus within segmental branches to the right upper lobe, left upper lobe and left lower lobe, extending distally. 2. Bilateral lower lobe airspace opacification is thought to reflect atelectasis, with trace bilateral pleural effusions. No definite evidence for aspiration. 3. Mild nonspecific left upper lobe opacities may reflect atelectasis.  1/26 - LE Dopplers - 1. Acute DVT right saphenofemoral junction, common femoral vein. 2. Acute DVT left saphenofemoral junction, left femoral vein, and mobile thrombus in the left common femoral vein.   DIAGNOSES: Principal Problem:  *Acute respiratory failure with hypercapnia Active Problems:  HYPERLIPIDEMIA  TOBACCO USE  HYPERTENSION  GERD  COPD (chronic obstructive pulmonary disease)  Acute encephalopathy  Acute kidney injury  Atrial fibrillation  PE (pulmonary embolism)  ASSESSMENT / PLAN:  PULMONARY A: 1) Acute hypercapnic respiratory failure/ARDS - secondary to flu, COPD exacerbation. Trach 1/22. 2) Aspiration - at time of intubation, finished course of treatment  3) Pneumonitis vs. CAP - b/l interstitial opacities on cxr, slightly increased at left, likely atelectasis. Completed 8/8 days rocephin, on tapering steroids  4) Pulmonary edema - improved. 4) Non-oxygen dependent COPD - h/o chronic tobacco abuse hx 3 pack per day 20+ years.  5) Bloody secretions from aspiration - Pulm /renal syndrome ruled out. 6) Upper airway severe angioedema & rash  from protamine  7) Likley small hematoma on rt apex from neck oozing  P:  - ATC as tolerated, goal 24x7 - Duonebs q6h + q2h PRN. - Continue steroids for rash - solumedrol 40 q 24h, taper once rash resolved - Continue attempts PMSV  CARDIOVASCULAR  A:  1) New onset Afib with RVR - no longer rate controlled. Unable to take oral amiodarone. On cardizem, amio. CE neg x 3. On heparin gtt. ? Back to NSR on 2/1 2) Hypotension - mild. With hx of HTN. 3) Anaphylaxis to protamine - protamine given in setting of trach placement, resultant anaphylactic reaction - resolved.  P:  - Goal HR < 120, cardizem gtt, currently at 5.-change to PO when able - Resume amio PO when able, may need amio IV if sustains AF  RENAL  Lab 05/09/12 0550 05/08/12 0445 05/07/12 0440 05/05/12 0420 05/04/12 1517  NA 140 139 -- -- --  K 3.2* 3.3* -- -- --  CL 100 100 -- -- --  CO2 31 31 -- -- --  BUN 19 20 -- -- --  CREATININE 0.55 0.61 -- -- --  GLUCOSE 112* 136* -- -- --  MG -- -- -- 1.8 1.9  PHOS -- -- 2.6 2.6 --   A:  1) Acute  Kidney injury - Resolved. Previously likely pre-renal in setting of volume depletion. Previously urinary sediment clogging foley, improved. Continue to monitor in setting of lasix use. 2) Hypernatremia - resolved  3) Hypokalemia - Previously in setting of lasix no in setting of NPO.  P:  - Replete IV KCl. - Goal to keep even.  GASTROINTESTINAL  Lab 05/05/12 0420 05/04/12 1517  AST 24 18  ALT 33 39  ALKPHOS 72 77  BILITOT 0.9 0.8  PROT 5.6* 5.5*  ALBUMIN 2.3* 2.3*   A:  1) GERD 2) Diarrhea - resolved. C. Diff negative. 3) Colonic Ileus  4) Paraesophageal nodes - need FU CT in 3-4 mnths P: - Continue TF, Pepcid - PEG delayed with lack of contrast, note colonic ileus - Minimize narcotics. - failed Swallow eval again 1/31- If he cannot pass by Monday 2/3 then ? Need to place PEG  HEMATOLOGIC  Lab 05/09/12 0550 05/08/12 0440  WBC 13.3* 16.1*  HGB 14.4 15.6  HCT 43.4  46.9  MCV 90.4 92.7  PLT 207 241   A:  1) Leukocytosis -  no fever. 3) Anticoagulation in setting of atrial fibrillation - New diagnosis. CHADS 3. Coumadin on home. On heparin gtt. 4) Bilateral PE 5) Bilateral DVT - IVC filter placement 1/27 P:  - IV heparin for VTE's - No coumadin for now until PEG  Question clarified - SCD  INFECTIOUS  Lab 05/09/12 0550 05/08/12 0440  WBC 13.3* 16.1*  NEUTROABS -- --   A: 1) Flu pneumonia - Flu A and metapneumovirus > finished Tamiflu x 10d, droplet isolation d/c 1/18 2) Aspirated at time of intubation, pneumonitis vs PNA> finished Rocephin x8d. Aspirated during reintubation.  3) Nosocomial infection - Left IJ (in 8d) dc'ed 1/13 and cath tip cultures>staph/coag neg > tx finished Vanc  4) Diarrhea - resolved. 5) Leukocytosis - likely 2/2 steroids. Ceftaz 1/18 --> 1/29 w/ sputum cx negative. Procalcitonin 0.14 --> 0.15. P: Continue to monitor for s/s of additional infection, observe off abx  ENDOCRINE  Lab 05/09/12 0742 05/09/12 0412 05/08/12 2320 05/08/12 1958 05/08/12 1601  GLUCAP 134* 117* 119* 132* 142*   A:  1) Diabetes mellitus, type 2 (A1c 7.1) - possible iatrogenic secondary to prolonged steroid use. 2) On steroids - CBGs at goal. Lantus off. P: ICU Hyperglycemic protocol  NEUROLOGIC / PSYCHIATRIC   A:  1) Delerium - persistent, controlled on scheduled ativan and fentanyl  P:  - Again, goal to stay off sedating drips - Continue scheduled haldol and PRN fentanyl for now. - Swallow study - then resume methadone 10mg , seroquel 200 bid - Monitor QTc - haldol 5 q6 standing - Does not qualify for LTAC, SNF/trach beds requested (possibly out of state) - PT/ OT consult. - IV Ativan. - CIR eval - good candidate once acute issues resolved   CLINICAL SUMMARY: 58 yoM with COPD (3ppd smoking) admitted 1/5 with VDRF 2/2 influenza A and asp PNA, intubated 04/13/11 - 1/15 for AW protection, aspirated during intubation. Reintubated  emergently 1/18 for severe delerium & resp distress, purulent secretions noted. Trach 1/22. New PE, DVT BL, s/p IVF filter 1/27. On hep gtt. Ileus persistent. ? PEG if unable to pass swallow eval by 2/3    Levy Pupa, MD, PhD 05/09/2012, 10:55 AM [facility] [phone] or if no answer [phone]

## 2012-05-09 NOTE — Progress Notes (Signed)
ANTICOAGULATION CONSULT NOTE - Follow Up Consult  Pharmacy Consult for heparin Indication: pulmonary embolus and DVT  Allergies  Allergen Reactions  . Protamine Anaphylaxis    Patient Measurements: Height: 5\' 10"  (177.8 cm) Weight: 201 lb 8 oz (91.4 kg) IBW/kg (Calculated) : 73  Heparin Dosing Weight: 92 kg  Vital Signs: Temp: 97.9 F (36.6 C) (02/01 0757) Temp src: Oral (02/01 0757) BP: 155/94 mmHg (02/01 0600) Pulse Rate: 82  (02/01 0600)  Labs:  Basename 05/09/12 0550 05/08/12 0445 05/08/12 0440 05/07/12 0440  HGB 14.4 -- 15.6 --  HCT 43.4 -- 46.9 48.6  PLT 207 -- 241 253  APTT -- -- -- --  LABPROT -- -- -- --  INR -- -- -- --  HEPARINUNFRC 0.58 -- 0.63 0.57  CREATININE 0.55 0.61 -- 0.47*  CKTOTAL -- -- -- --  CKMB -- -- -- --  TROPONINI -- -- -- --    Estimated Creatinine Clearance: 107.5 ml/min (by C-G formula based on Cr of 0.55).   Medications:  Scheduled:    . amiodarone  400 mg Oral Daily  . antiseptic oral rinse  15 mL Mouth Rinse QID  . chlorhexidine  15 mL Mouth Rinse BID  . famotidine (PEPCID) IV  20 mg Intravenous Daily  . haloperidol lactate  5 mg Intravenous Q6H  . insulin aspart  0-15 Units Subcutaneous Q4H  . ipratropium  0.5 mg Nebulization Q6H  . levalbuterol  0.63 mg Nebulization Q6H  . LORazepam  1 mg Intravenous Q12H  . methylPREDNISolone (SOLU-MEDROL) injection  40 mg Intravenous Q24H  . [EXPIRED] metoprolol      . multivitamin  5 mL Per Tube Daily  . [COMPLETED] potassium chloride  10 mEq Intravenous Q1 Hr x 3  . sodium chloride  500 mL Intravenous Once  . sodium chloride  10-40 mL Intracatheter Q12H   Infusions:    . sodium chloride 10 mL/hr at 05/09/12 0146  . sodium chloride 10 mL/hr at 05/09/12 0146  . diltiazem (CARDIZEM) infusion 5 mg/hr (05/09/12 0300)  . heparin 2,400 Units/hr (05/09/12 0146)    Assessment: 62 yo male with h/o atrial fibrillation, now with new DVT and PE, for heparin. Heparin currently therapuetic  (HL=0.58). Patient is s/p IVC filter 1/27.  Patient's CBC is stable. Some bleeding was noted yesterday around the nares after attempted nasogastric tube insertion. This is resolved today per RN. No active bleeding noted.   Goal of Therapy:  Heparin level 0.3-0.7 units/ml Monitor platelets by anticoagulation protocol: Yes   Plan:  -Continue 2400 units/hr  -Follow up with morning heparin level.  Lillia Pauls, PharmD Clinical Pharmacist 05/09/2012 9:25 AM

## 2012-05-10 LAB — BASIC METABOLIC PANEL
BUN: 14 mg/dL (ref 6–23)
CO2: 30 mEq/L (ref 19–32)
Chloride: 98 mEq/L (ref 96–112)
Creatinine, Ser: 0.46 mg/dL — ABNORMAL LOW (ref 0.50–1.35)
Glucose, Bld: 104 mg/dL — ABNORMAL HIGH (ref 70–99)

## 2012-05-10 LAB — CBC
HCT: 43.6 % (ref 39.0–52.0)
MCV: 89.7 fL (ref 78.0–100.0)
RBC: 4.86 MIL/uL (ref 4.22–5.81)
WBC: 12.8 10*3/uL — ABNORMAL HIGH (ref 4.0–10.5)

## 2012-05-10 LAB — GLUCOSE, CAPILLARY
Glucose-Capillary: 88 mg/dL (ref 70–99)
Glucose-Capillary: 133 mg/dL — ABNORMAL HIGH (ref 70–99)

## 2012-05-10 MED ORDER — LORAZEPAM 2 MG/ML IJ SOLN
1.0000 mg | Freq: Once | INTRAMUSCULAR | Status: AC
  Administered 2012-05-10: 1 mg via INTRAVENOUS

## 2012-05-10 MED ORDER — POTASSIUM CHLORIDE 10 MEQ/50ML IV SOLN
10.0000 meq | INTRAVENOUS | Status: AC
Start: 2012-05-10 — End: 2012-05-10
  Administered 2012-05-10 (×6): 10 meq via INTRAVENOUS
  Filled 2012-05-10: qty 50
  Filled 2012-05-10: qty 200
  Filled 2012-05-10: qty 50

## 2012-05-10 NOTE — Progress Notes (Signed)
Agitation   One time Ativan order given   Continue intermittent Versed and Fentanyl

## 2012-05-10 NOTE — Progress Notes (Signed)
eLink Physician-Brief Progress Note Patient Name: Cody [name] DOB: January 19, 1951 MRN: [id number]  Date of Service  05/10/2012   HPI/Events of Note  Hypokalemia  eICU Interventions  Potassium replaced   Intervention Category Intermediate Interventions: Electrolyte abnormality - evaluation and management  Henriette Hesser 05/10/2012, 5:53 AM

## 2012-05-10 NOTE — Progress Notes (Signed)
ANTICOAGULATION CONSULT NOTE - Follow Up Consult  Pharmacy Consult for heparin Indication: pulmonary embolus and DVT  Allergies  Allergen Reactions  . Protamine Anaphylaxis    Patient Measurements: Height: 5\' 10"  (177.8 cm) Weight: 197 lb 1.5 oz (89.4 kg) IBW/kg (Calculated) : 73  Heparin Dosing Weight: 92 kg  Vital Signs: Temp: 98.1 F (36.7 C) (02/02 0000) Temp src: Oral (02/02 0000) BP: 133/81 mmHg (02/02 0600) Pulse Rate: 77  (02/02 0842)  Labs:  Basename 05/10/12 0445 05/09/12 0550 05/08/12 0445 05/08/12 0440  HGB 14.5 14.4 -- --  HCT 43.6 43.4 -- 46.9  PLT 216 207 -- 241  APTT -- -- -- --  LABPROT -- -- -- --  INR -- -- -- --  HEPARINUNFRC 0.56 0.58 -- 0.63  CREATININE 0.46* 0.55 0.61 --  CKTOTAL -- -- -- --  CKMB -- -- -- --  TROPONINI -- -- -- --    Estimated Creatinine Clearance: 106.4 ml/min (by C-G formula based on Cr of 0.46).   Medications:  Scheduled:    . amiodarone  400 mg Oral Daily  . antiseptic oral rinse  15 mL Mouth Rinse QID  . chlorhexidine  15 mL Mouth Rinse BID  . famotidine (PEPCID) IV  20 mg Intravenous Daily  . haloperidol lactate  5 mg Intravenous Q6H  . insulin aspart  0-15 Units Subcutaneous Q4H  . ipratropium  0.5 mg Nebulization Q6H  . levalbuterol  0.63 mg Nebulization Q6H  . LORazepam  1 mg Intravenous Q12H  . methylPREDNISolone (SOLU-MEDROL) injection  40 mg Intravenous Q24H  . multivitamin  5 mL Per Tube Daily  . [COMPLETED] potassium chloride  10 mEq Intravenous Q1 Hr x 6  . potassium chloride  10 mEq Intravenous Q1 Hr x 6  . sodium chloride  500 mL Intravenous Once  . sodium chloride  10-40 mL Intracatheter Q12H   Infusions:    . sodium chloride 10 mL/hr at 05/10/12 0700  . sodium chloride 10 mL/hr at 05/10/12 0700  . diltiazem (CARDIZEM) infusion 10 mg/hr (05/10/12 0700)  . heparin 2,400 Units/hr (05/10/12 0700)    Assessment: 62 yo male with h/o atrial fibrillation, now with new DVT and PE, for heparin.  Heparin currently therapuetic (HL=0.56). Patient is s/p IVC filter 1/27.   Patient's CBC is stable. No bleeding noted.   Goal of Therapy:  Heparin level 0.3-0.7 units/ml  Monitor platelets by anticoagulation protocol: Yes   Plan:  -Continue 2400 units/hr  -Follow up with morning heparin level  Lillia Pauls, PharmD Clinical Pharmacist 05/10/2012 11:42 AM

## 2012-05-10 NOTE — Progress Notes (Signed)
PULMONARY  / CRITICAL CARE MEDICINE  Name: Cody [name] MRN: [id number] DOB: [birth year]    LOS: 28  REFERRING MD:   EDP - Dr. Preston Fleeting  CHIEF COMPLAINT:  Shortness of breath  BRIEF PATIENT DESCRIPTION: 74 yoM with COPD (3ppd smoking) admitted 1/5 with VDRF 2/2 influenza A and asp PNA, intubated 04/13/11 - 1/15 for AW protection, aspirated during intubation. Reintubated emergently 1/18 for severe delerium & resp distress, purulent secretions noted. Trach 1/22. New PE, DVT BL, s/p IVF filter 1/27. On hep gtt.   LINES / TUBES: ETT 1/5 >>1/15 , 1/18 >> self extubated 1/21 >>> reintubated 1/21 L IJ 1/5 >> 1/13 R IJ 1/13 >> 1/16 RIJ 1/18 >> 1/28 R A-line 1/5 >>1/8 pulled out by pt Trach 1/22 IVC Filter 1/27 >> PICC 1/28 >>  CULTURES: Blood culture 1/5>> Negative Resp culture 1/6>> H. influenzae  Resp viral panel 1/6>> Influenza A and Metapneumovirus  Sputum Leigonella Culture 1/6>> Negative Bronch Sputum 1/6>> H. influenzae  Flu 1/5>> Negative Blood culture 01/13 >> Negative Catheter tip culture 1/13 >>Staph -coag neg-70 colonies(vanc sens)  C.diff PCR 1/14 >> Negative Resp culture 1/18 >>> Negative  ANTIBIOTICS: Zosyn 1/5>>1/9 - restarted 1/22 >>1/28 Azithro 1/5>>1/8  Vanc 1/6>>>1/8 - restarted 1/13 >>1/25 Tamiflu 1/6>>>1/16  Rocephin 1/9>>1/15 Ceftazidine 1/18 >>1/22   SIGNIFICANT EVENTS:  1/5 Intubated in ED  1/5 Shock , levophed  1/6 ECHO - 55%, mod dil rv, PA pressures>>> not measured b/c no TR jet  1/6 Bronched- diffuse pus all lobes  1/7 increasing pCO2, increased RR on vent  1/8 Changed sedation to Precedex, more agitated than with versed  1/9 Changed sedation back to Versed, agitation greatly improved  1/10 scheduled haldol  1/12 Restarting precedex gtt due to persistent agitation 1/13 Febrile to 102.6 overnight. ? Catheter infection? DC'ed L IJ, placed new R IJ. 1/14 No fevers overnight, agitation controlled on propofol (added 1/13), more interactive. 1/15 Had  foul smelling stools overnight, therefore, C.diff PCR was sent by RN. 1/15 EXTUBATED, developed A.fib with RVR overnight. 1/16 Persistent episodes of desaturation., afib>started on cardizem drip  1/17 increased agitation  1/18 reintubated  1/20 Persistent A. Fib this morning on dilt drip.  Rates controlled between 99-125 1/21 Self extubated, given a chance and continued to be in respiratory distress so reintubated.  1/22 Tracheostomy, complicated by bleeding, angio edema from protamine 1/23 Continued agitation. 1/23 Neg acute ct head 1/24 PEG placement planned --> delayed bc of ileus 1/25 CTA showing PE BL --> started on heparin gtt. 1/26 BL LE DVT with mobile thrombus 1/27 IVC Filter placement. 1/28 PICC Line 1/29 Persistent ileus per abdominal x-ray. Attempting to limit narcotics. And 1/30 Tolerated PMSV placement for 15 minutes and fatigue and    LEVEL OF CARE:  ICU PRIMARY SERVICE:  PCCM CONSULTANTS:  None CODE STATUS: Full DIET:  TF on hold as repeatedly pulling tubes out. DVT Px:  Heparin gtt GI Px:  Protonix   SUBJECTIVE / INTERVAL HISTORY:  Patient indicates no pain, nausea, vomiting, shortness of breath  Interval Events: - Doing TC 24x7 - difficulty with PMV - failed FEES 1/31 - dilt gtt at 10 - request made for NGT per IR 2/1 pm  VITAL SIGNS: Temp:  [97.7 F (36.5 C)-98.1 F (36.7 C)] 98.1 F (36.7 C) (02/02 0000) Pulse Rate:  [35-105] 77  (02/02 0842) Resp:  [17-28] 22  (02/02 0842) BP: (132-174)/(81-109) 133/81 mmHg (02/02 0600) SpO2:  [86 %-100 %] 90 % (02/02 0842) FiO2 (%):  [  40 %-60 %] 40 % (02/02 0842) Weight:  [89.4 kg (197 lb 1.5 oz)] 89.4 kg (197 lb 1.5 oz) (02/02 0500) HEMODYNAMICS: VENTILATOR SETTINGS: Vent Mode:  [-]  FiO2 (%):  [40 %-60 %] 40 % INTAKE / OUTPUT:  Intake/Output Summary (Last 24 hours) at 05/10/12 1015 Last data filed at 05/10/12 1000  Gross per 24 hour  Intake 1410.67 ml  Output   3225 ml  Net -1814.33 ml    PHYSICAL  EXAMINATION: General: Tracheostomy in place, on ventilator, opens eyes to command and tracks around the room. Follows commands much more consistently.  Head: Normocephalic, Trach in place, no bleeding.  Lungs:  Diminished BS, coarse breath sounds clearing.Marland Kitchen  Heart: Irregularly irregular rhythm, regular rate.  Abdomen:  BS normoactive. Soft, Nondistended, non-tender.  No masses or organomegaly.  Extremities: No pretibial edema.     IMAGING:  1/30 - NG tube coiled overlying the proximal stomach.gaseous distention again noted.   1/25 - CTA chest - 1. Pulmonary embolus within segmental branches to the right upper lobe, left upper lobe and left lower lobe, extending distally. 2. Bilateral lower lobe airspace opacification is thought to reflect atelectasis, with trace bilateral pleural effusions. No definite evidence for aspiration. 3. Mild nonspecific left upper lobe opacities may reflect atelectasis.  1/26 - LE Dopplers - 1. Acute DVT right saphenofemoral junction, common femoral vein. 2. Acute DVT left saphenofemoral junction, left femoral vein, and mobile thrombus in the left common femoral vein.   DIAGNOSES: Principal Problem:  *Acute respiratory failure with hypercapnia Active Problems:  HYPERLIPIDEMIA  TOBACCO USE  HYPERTENSION  GERD  COPD (chronic obstructive pulmonary disease)  Acute encephalopathy  Acute kidney injury  Atrial fibrillation  PE (pulmonary embolism)  ASSESSMENT / PLAN:  PULMONARY A: 1) Acute hypercapnic respiratory failure/ARDS - secondary to flu, COPD exacerbation. Trach 1/22. 2) Aspiration - at time of intubation, finished course of treatment  3) Pneumonitis vs. CAP - b/l interstitial opacities on cxr, slightly increased at left, likely atelectasis. Completed 8/8 days rocephin, on tapering steroids  4) Pulmonary edema - improved. 4) Non-oxygen dependent COPD - h/o chronic tobacco abuse hx 3 pack per day 20+ years.  5) Bloody secretions from aspiration -  Pulm /renal syndrome ruled out. 6) Upper airway severe angioedema & rash from protamine  7) Likley small hematoma on rt apex from neck oozing  P:  - ATC as tolerated, goal 24x7 - Duonebs q6h + q2h PRN. - Continue steroids for rash - solumedrol 40 q 24h, taper over next week - Continue attempts PMSV  CARDIOVASCULAR  A:  1) New onset Afib with RVR - no longer rate controlled. Unable to take oral amiodarone. On cardizem, amio. CE neg x 3. On heparin gtt. ? Back to NSR on 2/1 2) Hypotension - mild. With hx of HTN. 3) Anaphylaxis to protamine - protamine given in setting of trach placement, resultant anaphylactic reaction - resolved.  P:  - Goal HR < 120, cardizem gtt, currently at 10.-change to PO when able - Resume amio PO when able, may need amio IV if sustains AF  RENAL  Lab 05/10/12 0445 05/09/12 0550 05/07/12 0440 05/05/12 0420  NA 137 140 -- --  K 3.3* 3.2* -- --  CL 98 100 -- --  CO2 30 31 -- --  BUN 14 19 -- --  CREATININE 0.46* 0.55 -- --  GLUCOSE 104* 112* -- --  MG 1.9 -- -- 1.8  PHOS 2.9 -- 2.6 --   A:  1) Acute Kidney injury - Resolved. Previously likely pre-renal in setting of volume depletion. Previously urinary sediment clogging foley, improved. Continue to monitor in setting of lasix use. 2) Hypernatremia - resolved  3) Hypokalemia - Previously in setting of lasix no in setting of NPO.  P:  - Repleting IV KCl. - Goal to keep even.  GASTROINTESTINAL  Lab 05/05/12 0420 05/04/12 1517  AST 24 18  ALT 33 39  ALKPHOS 72 77  BILITOT 0.9 0.8  PROT 5.6* 5.5*  ALBUMIN 2.3* 2.3*   A:  1) GERD 2) Diarrhea - resolved. C. Diff negative. 3) Colonic Ileus > resolved 4) Paraesophageal nodes - need FU CT in 3-4 mnths P: - Continue TF, Pepcid - Minimize narcotics. - failed Swallow eval again 1/31, NGT ordered but deferred 2/2 since he has pulled 2 GT already. Suspect he will need PEG next week, IR evaluating for this.   HEMATOLOGIC  Lab 05/10/12 0445 05/09/12  0550  WBC 12.8* 13.3*  HGB 14.5 14.4  HCT 43.6 43.4  MCV 89.7 90.4  PLT 216 207   A:  1) Leukocytosis -  no fever. 3) Anticoagulation in setting of atrial fibrillation - New diagnosis. CHADS 3. Coumadin on home. On heparin gtt. 4) Bilateral PE 5) Bilateral DVT - IVC filter placement 1/27 P:  - IV heparin for VTE's - No coumadin for now until PEG /NGT - SCD  INFECTIOUS  Lab 05/10/12 0445 05/09/12 0550  WBC 12.8* 13.3*  NEUTROABS -- --   A: 1) Flu pneumonia - Flu A and metapneumovirus > finished Tamiflu x 10d, droplet isolation d/c 1/18 2) Aspirated at time of intubation, pneumonitis vs PNA> finished Rocephin x8d. Aspirated during reintubation.  3) Nosocomial infection - Left IJ (in 8d) dc'ed 1/13 and cath tip cultures>staph/coag neg > tx finished Vanc  4) Diarrhea - resolved. 5) Leukocytosis - likely 2/2 steroids. Ceftaz 1/18 --> 1/29 w/ sputum cx negative. Procalcitonin 0.14 --> 0.15. P: Continue to monitor for s/s of additional infection, observe off abx  ENDOCRINE  Lab 05/10/12 0805 05/10/12 0445 05/10/12 0031 05/09/12 1937 05/09/12 1716  GLUCAP 133* 105* 88 123* 161*   A:  1) Diabetes mellitus, type 2 (A1c 7.1) - possible iatrogenic secondary to prolonged steroid use. 2) On steroids - CBGs at goal. Lantus off. P: ICU Hyperglycemic protocol  NEUROLOGIC / PSYCHIATRIC   A:  1) Delerium - persistent, controlled on scheduled ativan, haldol and fentanyl, prn versed  P:  - Again, goal to stay off sedating drips - Continue scheduled haldol and PRN fentanyl for now. - Swallow study - then resume methadone 10mg , seroquel 200 bid - Monitor QTc - haldol 5 q6 standing - Does not qualify for LTAC, SNF/trach beds requested ([place] out of [place]) - PT/ OT consult. - IV Ativan. - CIR eval - good candidate once acute issues resolved  GLOBAL:  - transfer to SDU on stable dilt gtt, should not need to be titrated. Will need to start enteral dilt in order to turn IV gtt off  (once PEG placed)   CLINICAL SUMMARY: 73 yoM with COPD (3ppd smoking) admitted 1/5 with VDRF 2/2 influenza A and asp PNA, intubated 04/13/11 - 1/15 for AW protection, aspirated during intubation. Reintubated emergently 1/18 for severe delerium & resp distress, purulent secretions noted. Trach 1/22. New PE, DVT BL, s/p IVF filter 1/27. On hep gtt. Ileus resolved. Needs PEG if unable to pass swallow eval by 2/3    Levy Pupa, MD, PhD 05/10/2012, 10:15  AM [facility] [phone] or if no answer [phone]

## 2012-05-11 ENCOUNTER — Inpatient Hospital Stay (HOSPITAL_COMMUNITY): Payer: Medicaid Other

## 2012-05-11 LAB — BASIC METABOLIC PANEL
BUN: 13 mg/dL (ref 6–23)
CO2: 28 mEq/L (ref 19–32)
Calcium: 9.2 mg/dL (ref 8.4–10.5)
Creatinine, Ser: 0.5 mg/dL (ref 0.50–1.35)
Glucose, Bld: 245 mg/dL — ABNORMAL HIGH (ref 70–99)

## 2012-05-11 LAB — CBC
HCT: 47.3 % (ref 39.0–52.0)
MCH: 30.6 pg (ref 26.0–34.0)
MCV: 88.2 fL (ref 78.0–100.0)
Platelets: 230 10*3/uL (ref 150–400)
RBC: 5.36 MIL/uL (ref 4.22–5.81)

## 2012-05-11 LAB — GLUCOSE, CAPILLARY
Glucose-Capillary: 122 mg/dL — ABNORMAL HIGH (ref 70–99)
Glucose-Capillary: 157 mg/dL — ABNORMAL HIGH (ref 70–99)

## 2012-05-11 MED ORDER — CLONAZEPAM 0.1 MG/ML ORAL SUSPENSION
1.0000 mg | Freq: Two times a day (BID) | ORAL | Status: DC
  Filled 2012-05-11 (×2): qty 10

## 2012-05-11 MED ORDER — PREDNISONE 5 MG/ML PO CONC
30.0000 mg | Freq: Every day | ORAL | Status: DC
  Administered 2012-05-11 – 2012-05-15 (×5): 30 mg
  Filled 2012-05-11 (×5): qty 6

## 2012-05-11 MED ORDER — CLONAZEPAM 0.5 MG PO TABS
1.0000 mg | ORAL_TABLET | Freq: Two times a day (BID) | ORAL | Status: DC
  Administered 2012-05-11 – 2012-05-29 (×33): 1 mg
  Filled 2012-05-11 (×7): qty 1
  Filled 2012-05-11: qty 2
  Filled 2012-05-11 (×17): qty 1
  Filled 2012-05-11: qty 2
  Filled 2012-05-11 (×9): qty 1

## 2012-05-11 MED ORDER — ALTEPLASE 100 MG IV SOLR
2.0000 mg | Freq: Once | INTRAVENOUS | Status: AC
  Administered 2012-05-11: 2 mg
  Filled 2012-05-11: qty 2

## 2012-05-11 MED ORDER — LORAZEPAM 2 MG/ML IJ SOLN
1.0000 mg | Freq: Two times a day (BID) | INTRAMUSCULAR | Status: DC | PRN
  Administered 2012-05-13 – 2012-05-23 (×13): 1 mg via INTRAVENOUS
  Filled 2012-05-11 (×14): qty 1

## 2012-05-11 MED ORDER — FENTANYL CITRATE 0.05 MG/ML IJ SOLN
25.0000 ug | INTRAMUSCULAR | Status: DC | PRN
  Administered 2012-05-11 – 2012-05-13 (×2): 50 ug via INTRAVENOUS
  Administered 2012-05-13: 20:00:00 via INTRAVENOUS
  Administered 2012-05-14 (×2): 50 ug via INTRAVENOUS
  Administered 2012-05-17 (×2): 25 ug via INTRAVENOUS
  Administered 2012-05-19 – 2012-05-26 (×3): 50 ug via INTRAVENOUS
  Filled 2012-05-11 (×12): qty 2

## 2012-05-11 MED ORDER — ALTEPLASE 100 MG IV SOLR
2.0000 mg | Freq: Once | INTRAVENOUS | Status: AC
  Administered 2012-05-11: 2 mg
  Filled 2012-05-11 (×2): qty 2

## 2012-05-11 MED ORDER — DILTIAZEM 12 MG/ML ORAL SUSPENSION
30.0000 mg | Freq: Four times a day (QID) | ORAL | Status: DC
  Administered 2012-05-11 – 2012-05-25 (×48): 30 mg
  Filled 2012-05-11 (×60): qty 3

## 2012-05-11 NOTE — Progress Notes (Signed)
Rn had just suctioned pt.  Will check if needed at 0200 rounds.

## 2012-05-11 NOTE — Progress Notes (Signed)
Occupational Therapy Treatment Patient Details Name: Cody [name] MRN: [id number] DOB: August 02, 1950 Today's Date: 05/11/2012 Time: 7829-5621 OT Time Calculation (min): 33 min  OT Assessment / Plan / Recommendation Comments on Treatment Session Good session today with increased participation. Able to stand with +2 total A with pt attempting to initiate transitional movement patterns appropriattely. Pt also initiating simple grooming tasks. Limited by oerall weakness and deconditioning. CIR continues to be appropriate venue for D/C to facilitate return to PLOF. Family friend present during session. willcontinue to follow.    Follow Up Recommendations  CIR    Barriers to Discharge       Equipment Recommendations  3 in 1 bedside comode;Tub/shower bench;Wheelchair (measurements OT)    Recommendations for Other Services Rehab consult  Frequency Min 2X/week   Plan Discharge plan remains appropriate    Precautions / Restrictions Precautions Precautions: Fall Precaution Comments: trach   Pertinent Vitals/Pain O2 Sats 93% 40%. Vitals WNL    ADL  Eating/Feeding: NPO Grooming: Teeth care;Wash/dry face;Maximal assistance;Other (comment) (usinghand over hand. Pt initiating movement pattern) Where Assessed - Grooming: Supported sitting Equipment Used: Gait belt Transfers/Ambulation Related to ADLs: +2 total A using "3 musketeer " approach ADL Comments: Sat EOB @ 15 min. Initiating purposeful movement (Goals updated)    OT Diagnosis:    OT Problem List:   OT Treatment Interventions:     OT Goals Acute Rehab OT Goals OT Goal Formulation: Patient unable to participate in goal setting Time For Goal Achievement: 05/22/12 Potential to Achieve Goals: Fair ADL Goals Pt Will Perform Grooming: Sitting, chair;Sitting, edge of bed;Unsupported;with min assist ADL Goal: Grooming - Progress: Revised due to lack of progress Pt Will Perform Upper Body Bathing: Sitting, chair;Sitting, edge of bed;with  min assist;Supported ADL Goal: Upper Body Bathing - Progress: Revised due to lack of progress Pt Will Perform Lower Body Bathing: with mod assist;Sitting, edge of bed;Sit to stand from bed;Supported;with cueing (comment type and amount) ADL Goal: Lower Body Bathing - Progress: Revised due to lack of progress Pt Will Perform Lower Body Dressing: with max assist;Sit to stand from bed;Supported;with cueing (comment type and amount) ADL Goal: Lower Body Dressing - Progress: Revised due to lack of progress Pt Will Transfer to Toilet: with max assist;3-in-1 ADL Goal: Toilet Transfer - Progress: Revised due to lack of progress Pt Will Perform Toileting - Clothing Manipulation: with max assist;Sitting on 3-in-1 or toilet ADL Goal: Toileting - Clothing Manipulation - Progress: Revised due to lack of progress Pt Will Perform Tub/Shower Transfer: Shower transfer;with modified independence;Ambulation;with DME;Shower seat with back ADL Goal: Web designer - Progress: Discontinued (comment) Miscellaneous OT Goals Miscellaneous OT Goal #1: Pt will independently verbalize 3 energy conservation techniques to be used during ADLs. OT Goal: Miscellaneous Goal #1 - Progress: Discontinued (comment) Miscellaneous OT Goal #2: Pt will perform bed mobility with mod assist as precursor for EOB ADL retraining. OT Goal: Miscellaneous Goal #2 - Progress: Revised due to lack of progress Miscellaneous OT Goal #3: Pt will consistently follow one step commands during 75% of therapy session. OT Goal: Miscellaneous Goal #3 - Progress: Progressing toward goals  Visit Information  Last OT Received On: 05/11/12 Assistance Needed: +2 PT/OT Co-Evaluation/Treatment: Yes    Subjective Data      Prior Functioning       Cognition  Cognition Overall Cognitive Status: Impaired Area of Impairment: Following commands;Safety/judgement;Attention;Problem solving Difficult to assess due to: Tracheostomy Arousal/Alertness:  Awake/alert Behavior During Session: Flat affect Current Attention Level: Sustained Attention -  Other Comments: affected by fatigue Following Commands: Follows one step commands inconsistently;Other (comment) (most likely due to fatigue) Safety/Judgement: Decreased safety judgement for tasks assessed;Other (comment) Safety/Judgement - Other Comments: Decreased imipulsivity during today's session Awareness of Errors: Assistance required to identify errors made;Assistance required to correct errors made (facilitation to upright trunk sitting EOBfrom extension) Problem Solving: mod a functional basic Cognition - Other Comments: will further assess    Mobility  Bed Mobility Bed Mobility: Supine to Sit Rolling Right: 2: Max assist Supine to Sit: 2: Max assist Sitting - Scoot to Edge of Bed: 1: +2 Total assist Sitting - Scoot to Edge of Bed: Patient Percentage: 30% Details for Bed Mobility Assistance: max facilitatory cues to bring legs off the bed and raise trunk Transfers Transfers: Sit to Stand;Stand to Sit Sit to Stand: 1: +2 Total assist;From bed Sit to Stand: Patient Percentage: 20% Stand to Sit: 1: +2 Total assist;To bed;To chair/3-in-1 Stand to Sit: Patient Percentage: 20% Details for Transfer Assistance: max faciliatory cues bilaterally for anterior translation of trunk as well as extension through trunk and lower extremities; facilitation for weight shift during transfer    Exercises      Balance Balance Balance Assessed: Yes Static Sitting Balance Static Sitting - Balance Support: Bilateral upper extremity supported Static Sitting - Level of Assistance: 4: Min assist;2: Max assist;Other (comment) (fluctuated from min to max A. Limited by fatigue) Static Sitting - Comment/# of Minutes: 15 Static Standing Balance Static Standing - Balance Support: Bilateral upper extremity supported Static Standing - Level of Assistance: 1: +2 Total assist;Patient percentage (comment) Static  Standing - Comment/# of Minutes: 3 min during stand then transer   End of Session OT - End of Session Equipment Utilized During Treatment: Gait belt Activity Tolerance: Patient limited by fatigue Patient left: in chair;with call bell/phone within reach;with nursing in room;with family/visitor present Nurse Communication: Mobility status;Need for lift equipment  GO     Marlon Suleiman,HILLARY 05/11/2012, 4:18 PM [facility], OTR/L  [phone] 05/11/2012

## 2012-05-11 NOTE — Progress Notes (Addendum)
ANTICOAGULATION CONSULT NOTE - Follow Up Consult  Pharmacy Consult for heparin Indication: DVT?PE  Allergies  Allergen Reactions  . Protamine Anaphylaxis    Patient Measurements: Height: 5\' 10"  (177.8 cm) Weight: 188 lb 11.4 oz (85.6 kg) IBW/kg (Calculated) : 73    Vital Signs: Temp: 97.9 F (36.6 C) (02/03 0400) Temp src: Oral (02/03 0400) BP: 126/77 mmHg (02/03 1148) Pulse Rate: 73  (02/03 1148)  Labs:  Basename 05/11/12 0950 05/11/12 0330 05/10/12 0445 05/09/12 0550  HGB -- 16.4 14.5 --  HCT -- 47.3 43.6 43.4  PLT -- 230 216 207  APTT -- -- -- --  LABPROT -- -- -- --  INR -- -- -- --  HEPARINUNFRC 0.58 0.82* 0.56 --  CREATININE -- 0.50 0.46* 0.55  CKTOTAL -- -- -- --  CKMB -- -- -- --  TROPONINI -- -- -- --    Estimated Creatinine Clearance: 97.6 ml/min (by C-G formula based on Cr of 0.5).   Medications:  Scheduled:    . [COMPLETED] alteplase  2 mg Intracatheter Once  . [COMPLETED] alteplase  2 mg Intracatheter Once  . [COMPLETED] alteplase  2 mg Intracatheter Once  . amiodarone  400 mg Oral Daily  . antiseptic oral rinse  15 mL Mouth Rinse QID  . chlorhexidine  15 mL Mouth Rinse BID  . clonazePAM  1 mg Per Tube BID  . diltiazem  30 mg Per Tube Q6H  . famotidine (PEPCID) IV  20 mg Intravenous Daily  . haloperidol lactate  5 mg Intravenous Q6H  . insulin aspart  0-15 Units Subcutaneous Q4H  . ipratropium  0.5 mg Nebulization Q6H  . levalbuterol  0.63 mg Nebulization Q6H  . [COMPLETED] LORazepam  1 mg Intravenous Once  . multivitamin  5 mL Per Tube Daily  . predniSONE  30 mg Per Tube Daily  . sodium chloride  10-40 mL Intracatheter Q12H  . [DISCONTINUED] clonazePAM  1 mg Oral BID  . [DISCONTINUED] LORazepam  1 mg Intravenous Q12H  . [DISCONTINUED] methylPREDNISolone (SOLU-MEDROL) injection  40 mg Intravenous Q24H  . [DISCONTINUED] sodium chloride  500 mL Intravenous Once   Infusions:    . sodium chloride 10 mL/hr at 05/10/12 2124  . sodium  chloride 10 mL/hr at 05/10/12 2125  . [EXPIRED] diltiazem (CARDIZEM) infusion 15 mg/hr (05/11/12 0853)  . heparin 2,300 Units/hr (05/11/12 0415)    Assessment: 62 yo male with new PE/DVT (s/p IVC filter on 1/27). Heparin level is at goal (HL= 0.58) and hg/hct= 16.4/47.3. Coumadin to start once PEG in place.   Goal of Therapy:  Heparin level 0.3-0.7 units/ml Monitor platelets by anticoagulation protocol: Yes   Plan:  -Continue heparin at 2300 units/hr -Heparin level and CBC in am   Harland German, Pharm D 05/11/2012 1:19 PM

## 2012-05-11 NOTE — Progress Notes (Signed)
NUTRITION FOLLOW UP  Intervention:   1. Recommend initiation of Jevity 1.2@ 20 via NG tube, advance by 10 ml q 4 hrs to a goal rate of 60 ml/hr. 30 ml Pro-stat TID per tube. This EN regimen at goal rate will provide 2028 kcal, 125 gm protein, and 1162 ml free water.   2. RD will continue to follow    Nutrition Dx:   Inadequate oral intake, ongoing.   Goal:   Intake to meet >/=90% estimated nutrition needs. Unmet at this time.   Monitor:   Vent status & settings, PEG placement, weight trends, I/O's, swallow evaluation  Assessment:  Continues with ileus. PEG placement has been on hold until this resolves. FEES on 1/31 recommending NPO.  Pt s/p placement of NG tube in IR, "Nasogastric tube placement under fluoroscopy with the tip in the region of the pylorus or duodenal bulb." per imaging report.   Height: Ht Readings from Last 1 Encounters:  09/19/10 5' 10.5" (1.791 m)    Weight Status:   Wt Readings from Last 1 Encounters:  05/11/12 188 lb 11.4 oz (85.6 kg)   Weight trending down, admission weight of 231 lbs   Re-estimated needs:  Kcal: 2000-2200 kcal  Protein: 125-135 gm Fluid: >/= 1.2 L daily   Skin: stage 2 pressure ulcer buttocks, moisture associated wound to inner thigh   Diet Order: NPO   Intake/Output Summary (Last 24 hours) at 05/11/12 1221 Last data filed at 05/11/12 0700  Gross per 24 hour  Intake 1016.09 ml  Output   2150 ml  Net -1133.91 ml    Last BM: 2/3   Labs:   Lab 05/11/12 0330 05/10/12 0445 05/09/12 0550 05/07/12 0440 05/05/12 0420 05/04/12 1517  NA 131* 137 140 -- -- --  K 3.7 3.3* 3.2* -- -- --  CL 94* 98 100 -- -- --  CO2 28 30 31  -- -- --  BUN 13 14 19  -- -- --  CREATININE 0.50 0.46* 0.55 -- -- --  CALCIUM 9.2 9.0 9.0 -- -- --  MG -- 1.9 -- -- 1.8 1.9  PHOS -- 2.9 -- 2.6 2.6 --  GLUCOSE 245* 104* 112* -- -- --    CBG (last 3)   Basename 05/11/12 0850 05/11/12 0331 05/11/12 0043  GLUCAP 157* 122* 102*    Scheduled  Meds:    . amiodarone  400 mg Oral Daily  . antiseptic oral rinse  15 mL Mouth Rinse QID  . chlorhexidine  15 mL Mouth Rinse BID  . clonazePAM  1 mg Per Tube BID  . diltiazem  30 mg Per Tube Q6H  . famotidine (PEPCID) IV  20 mg Intravenous Daily  . haloperidol lactate  5 mg Intravenous Q6H  . insulin aspart  0-15 Units Subcutaneous Q4H  . ipratropium  0.5 mg Nebulization Q6H  . levalbuterol  0.63 mg Nebulization Q6H  . multivitamin  5 mL Per Tube Daily  . predniSONE  30 mg Per Tube Daily  . sodium chloride  10-40 mL Intracatheter Q12H    Continuous Infusions:    . sodium chloride 10 mL/hr at 05/10/12 2124  . sodium chloride 10 mL/hr at 05/10/12 2125  . diltiazem (CARDIZEM) infusion 15 mg/hr (05/11/12 0853)  . heparin 2,300 Units/hr (05/11/12 0415)    Clarene Duke RD, LDN Pager [phone] After Hours pager [phone]

## 2012-05-11 NOTE — Progress Notes (Signed)
Clinical Social Work  CSW continues to follow patient to assist with dc planning. CIR has been consulted and patient has been faxed out for SNF as alternative option. Per chart review, patient not ready to dc. CSW will continue to follow to assist with dc planning.  [place], [place] [phone]

## 2012-05-11 NOTE — Progress Notes (Signed)
Physical Therapy Treatment Patient Details Name: Cody [name] MRN: [id number] DOB: [birth year] Today's Date: 05/11/2012 Time: 1191-4782 PT Time Calculation (min): 35 min  PT Assessment / Plan / Recommendation Comments on Treatment Session  Adm sob and resp fail due to flu and pna with vdrf 1/15, 1/18-1/21 with trach and bronchoscopy 1/22. Transferred OOB to chair today but still pt very weak.     Follow Up Recommendations  CIR     Does the patient have the potential to tolerate intense rehabilitation     Barriers to Discharge        Equipment Recommendations   (tbd at next venue)    Recommendations for Other Services    Frequency     Plan Discharge plan remains appropriate;Frequency remains appropriate    Precautions / Restrictions Precautions Precautions: Fall Precaution Comments: trach   Pertinent Vitals/Pain Trach collar, sats remained above 90% throughout session    Mobility  Bed Mobility Bed Mobility: Supine to Sit Rolling Right: 2: Max assist Supine to Sit: 2: Max assist Sitting - Scoot to [place] of Bed: 1: +2 Total assist Sitting - Scoot to Edge of Bed: Patient Percentage: 30% Details for Bed Mobility Assistance: max facilitatory cues to bring legs off the bed and raise trunk Transfers Transfers: Sit to Stand;Stand to Sit;Stand Pivot Transfers Sit to Stand: 1: +2 Total assist;From bed Sit to Stand: Patient Percentage: 20% Stand to Sit: 1: +2 Total assist;To bed;To chair/3-in-1 Stand to Sit: Patient Percentage: 20% Stand Pivot Transfers: 1: +2 Total assist (bed->chair) Stand Pivot Transfers: Patient Percentage: 20% Details for Transfer Assistance: max faciliatory cues bilaterally for anterior translation of trunk as well as extension through trunk and lower extremities; facilitation for weight shift during transfer Ambulation/Gait Ambulation/Gait Assistance: Not tested (comment)      PT Goals Acute Rehab PT Goals PT Goal: Supine/Side to Sit - Progress:  Progressing toward goal PT Goal: Sit at Edge Of Bed - Progress: Progressing toward goal Pt will go Sit to Stand: with +2 total assist (60%) PT Goal: Sit to Stand - Progress: Revised due to lack of progress Pt will go Stand to Sit: with +2 total assist;Other (comment) (60%) PT Goal: Stand to Sit - Progress: Revised due to lack of progress Pt will Ambulate: with +2 total assist;1 - 15 feet (50%) PT Goal: Ambulate - Progress: Revised due to lack of progress  Visit Information  Last PT Received On: 05/11/12 Assistance Needed: +2    Subjective Data  Subjective: mouthed thank you following our session   Cognition  Cognition Overall Cognitive Status: Impaired Area of Impairment: Following commands;Safety/judgement;Attention;Problem solving Difficult to assess due to: Tracheostomy Arousal/Alertness: Awake/alert Behavior During Session: Flat affect Current Attention Level: Sustained Attention - Other Comments: affected by fatigue Following Commands: Follows one step commands inconsistently;Other (comment) (most likely due to fatigue) Safety/Judgement: Decreased safety judgement for tasks assessed;Other (comment) Safety/Judgement - Other Comments: Decreased imipulsivity during today's session Awareness of Errors: Assistance required to identify errors made;Assistance required to correct errors made (facilitation to upright trunk sitting EOBfrom extension) Problem Solving: mod a functional basic Cognition - Other Comments: will further assess    Balance  Balance Balance Assessed: Yes Static Sitting Balance Static Sitting - Balance Support: Bilateral upper extremity supported Static Sitting - Level of Assistance: 4: Min assist;2: Max assist;Other (comment) (fluctuated from min to max A. Limited by fatigue) Static Sitting - Comment/# of Minutes: 15 Static Standing Balance Static Standing - Balance Support: Bilateral upper extremity supported Static Standing -  Level of Assistance: 1: +2  Total assist;Patient percentage (comment) Static Standing - Comment/# of Minutes: 3 min during stand then transer  End of Session PT - End of Session Equipment Utilized During Treatment: Gait belt Activity Tolerance: Patient limited by fatigue Patient left: in chair;with call bell/phone within reach Nurse Communication: Mobility status;Need for lift equipment   GP     [facility] HELEN 05/11/2012, 4:17 PM

## 2012-05-11 NOTE — Progress Notes (Signed)
ANTICOAGULATION CONSULT NOTE - Follow Up Consult  Pharmacy Consult for heparin Indication: pulmonary embolus and DVT  Labs:  Basename 05/11/12 0330 05/10/12 0445 05/09/12 0550  HGB 16.4 14.5 --  HCT 47.3 43.6 43.4  PLT 230 216 207  APTT -- -- --  LABPROT -- -- --  INR -- -- --  HEPARINUNFRC 0.82* 0.56 0.58  CREATININE 0.50 0.46* 0.55  CKTOTAL -- -- --  CKMB -- -- --  TROPONINI -- -- --    Assessment: 62yo male now supratherapeutic on heparin for DVT/PE, no gtt issues per RN.  Goal of Therapy:  Heparin level 0.3-0.7 units/ml   Plan:  Will decrease heparin gtt by 1 unit/kg/hr to 2300 units/hr and check level in 6hr.  Colleen Can PharmD BCPS 05/11/2012,4:21 AM

## 2012-05-11 NOTE — Progress Notes (Signed)
PULMONARY  / CRITICAL CARE MEDICINE  Name: Cody [name] MRN: [id number] DOB: 10/10/1950    LOS: 29  REFERRING MD:   EDP - Dr. Preston Fleeting  CHIEF COMPLAINT:  Shortness of breath  BRIEF PATIENT DESCRIPTION: 58 yoM with COPD (3ppd smoking) admitted 1/5 with VDRF 2/2 influenza A and asp PNA, intubated 04/13/11 - 1/15 for AW protection, aspirated during intubation. Reintubated emergently 1/18 for severe delerium & resp distress, purulent secretions noted. Trach 1/22. New PE, DVT BL, s/p IVF filter 1/27. On hep gtt.   LINES / TUBES: ETT 1/5 >>1/15 , 1/18 >> self extubated 1/21 >>> reintubated 1/21 L IJ 1/5 >> 1/13 R IJ 1/13 >> 1/16 RIJ 1/18 >> 1/28 R A-line 1/5 >>1/8 pulled out by pt Trach 1/22 IVC Filter 1/27 >> PICC 1/28 >>  CULTURES: Blood culture 1/5>> Negative Resp culture 1/6>> H. influenzae  Resp viral panel 1/6>> Influenza A and Metapneumovirus  Sputum Leigonella Culture 1/6>> Negative Bronch Sputum 1/6>> H. influenzae  Flu 1/5>> Negative Blood culture 01/13 >> Negative Catheter tip culture 1/13 >>Staph -coag neg-70 colonies(vanc sens)  C.diff PCR 1/14 >> Negative Resp culture 1/18 >>> Negative  ANTIBIOTICS: Zosyn 1/5>>1/9 - restarted 1/22 >>1/28 Azithro 1/5>>1/8  Vanc 1/6>>>1/8 - restarted 1/13 >>1/25 Tamiflu 1/6>>>1/16  Rocephin 1/9>>1/15 Ceftazidine 1/18 >>1/22   SIGNIFICANT EVENTS:  1/5 Intubated in ED  1/5 Shock , levophed  1/6 ECHO - 55%, mod dil rv, PA pressures>>> not measured b/c no TR jet  1/6 Bronched- diffuse pus all lobes  1/7 increasing pCO2, increased RR on vent  1/8 Changed sedation to Precedex, more agitated than with versed  1/9 Changed sedation back to Versed, agitation greatly improved  1/10 scheduled haldol  1/12 Restarting precedex gtt due to persistent agitation 1/13 Febrile to 102.6 overnight. ? Catheter infection? DC'ed L IJ, placed new R IJ. 1/14 No fevers overnight, agitation controlled on propofol (added 1/13), more interactive. 1/15 Had  foul smelling stools overnight, therefore, C.diff PCR was sent by RN. 1/15 EXTUBATED, developed A.fib with RVR overnight. 1/16 Persistent episodes of desaturation., afib>started on cardizem drip  1/17 increased agitation  1/18 reintubated  1/20 Persistent A. Fib this morning on dilt drip.  Rates controlled between 99-125 1/21 Self extubated, given a chance and continued to be in respiratory distress so reintubated.  1/22 Tracheostomy, complicated by bleeding, angio edema from protamine 1/23 Continued agitation. 1/23 Neg acute ct head 1/24 PEG placement planned --> delayed bc of ileus 1/25 CTA showing PE BL --> started on heparin gtt. 1/26 BL LE DVT with mobile thrombus 1/27 IVC Filter placement. 1/28 PICC Line 1/29 Persistent ileus per abdominal x-ray. Attempting to limit narcotics. And 1/30 Tolerated PMSV placement for 15 minutes and fatigue and    LEVEL OF CARE:  ICU PRIMARY SERVICE:  PCCM CONSULTANTS:  None CODE STATUS: Full DIET:  TF on hold as repeatedly pulling tubes out. DVT Px:  Heparin gtt GI Px:  Protonix   SUBJECTIVE / INTERVAL HISTORY:  Patient indicates no pain, nausea, vomiting, shortness of breath  Interval Events: - Doing TC 24x7 - difficulty with PMV - failed FEES 1/31 - dilt gtt at 10 - request made for NGT per IR 2/1 pm  VITAL SIGNS: Temp:  [97.9 F (36.6 C)-98.4 F (36.9 C)] 97.9 F (36.6 C) (02/03 0400) Pulse Rate:  [65-114] 89  (02/03 0830) Resp:  [18-27] 20  (02/03 0830) BP: (125-158)/(82-120) 145/89 mmHg (02/03 0700) SpO2:  [88 %-97 %] 93 % (02/03 0830) FiO2 (%):  [  35 %-40 %] 40 % (02/03 0830) Weight:  [85.6 kg (188 lb 11.4 oz)] 85.6 kg (188 lb 11.4 oz) (02/03 0700) HEMODYNAMICS: VENTILATOR SETTINGS: Vent Mode:  [-]  FiO2 (%):  [35 %-40 %] 40 % INTAKE / OUTPUT:  Intake/Output Summary (Last 24 hours) at 05/11/12 1018 Last data filed at 05/11/12 0700  Gross per 24 hour  Intake 1274.09 ml  Output   2325 ml  Net -1050.91 ml     PHYSICAL EXAMINATION: General: Tracheostomy in place, on ventilator, opens eyes to command and tracks around the room but not following any command.  Head: Normocephalic, Trach in place, no bleeding.  Lungs:  Diminished BS, coarse breath sounds clearing.Marland Kitchen  Heart: Irregularly irregular rhythm, regular rate.  Abdomen:  BS normoactive. Soft, Nondistended, non-tender.  No masses or organomegaly.  Extremities: No pretibial edema.     IMAGING:  1/30 - NG tube coiled overlying the proximal stomach.gaseous distention again noted.   1/25 - CTA chest - 1. Pulmonary embolus within segmental branches to the right upper lobe, left upper lobe and left lower lobe, extending distally. 2. Bilateral lower lobe airspace opacification is thought to reflect atelectasis, with trace bilateral pleural effusions. No definite evidence for aspiration. 3. Mild nonspecific left upper lobe opacities may reflect atelectasis.  1/26 - LE Dopplers - 1. Acute DVT right saphenofemoral junction, common femoral vein. 2. Acute DVT left saphenofemoral junction, left femoral vein, and mobile thrombus in the left common femoral vein.   DIAGNOSES: Principal Problem:  *Acute respiratory failure with hypercapnia Active Problems:  HYPERLIPIDEMIA  TOBACCO USE  HYPERTENSION  GERD  COPD (chronic obstructive pulmonary disease)  Acute encephalopathy  Acute kidney injury  Atrial fibrillation  PE (pulmonary embolism)  ASSESSMENT / PLAN:  PULMONARY A: 1) Acute hypercapnic respiratory failure/ARDS - secondary to flu, COPD exacerbation. Trach 1/22. 2) Aspiration - at time of intubation, finished course of treatment  3) Pneumonitis vs. CAP - b/l interstitial opacities on cxr, slightly increased at left, likely atelectasis. Completed 8/8 days rocephin, on tapering steroids  4) Pulmonary edema - improved. 4) Non-oxygen dependent COPD - h/o chronic tobacco abuse hx 3 pack per day 20+ years.  5) Bloody secretions from aspiration -  Pulm /renal syndrome ruled out. 6) Upper airway severe angioedema & rash from protamine  7) Likley small hematoma on rt apex from neck oozing  P:  - ATC as tolerated, goal 24x7 - Duonebs q6h + q2h PRN. - Continue steroids for rash - D/C solumedrol and use prednisone 30 mg PO daily. - Continue attempts PMSV, following command is the primary issue here now.  CARDIOVASCULAR  A:  1) New onset Afib with RVR - no longer rate controlled. Unable to take oral amiodarone. On cardizem, amio. CE neg x 3. On heparin gtt. ? Back to NSR on 2/1 2) Hypotension - mild. With hx of HTN. 3) Anaphylaxis to protamine - protamine given in setting of trach placement, resultant anaphylactic reaction - resolved.  P:  - Goal HR < 120, cardizem gtt, currently at 10.-change to PO when able - Resume amio PO when able, may need amio IV if sustains AF - Attempt to transition IV to PO dilt, will start with 30 q6 hours PO today since NGT is in.  RENAL  Lab 05/11/12 0330 05/10/12 0445 05/07/12 0440 05/05/12 0420  NA 131* 137 -- --  K 3.7 3.3* -- --  CL 94* 98 -- --  CO2 28 30 -- --  BUN 13 14 -- --  CREATININE 0.50 0.46* -- --  GLUCOSE 245* 104* -- --  MG -- 1.9 -- 1.8  PHOS -- 2.9 2.6 --   A:  1) Acute Kidney injury - Resolved. Previously likely pre-renal in setting of volume depletion. Previously urinary sediment clogging foley, improved. Continue to monitor in setting of lasix use. 2) Hypernatremia - resolved  3) Hypokalemia - Previously in setting of lasix no in setting of NPO.  P:  - Repleting IV KCl. - Goal to keep even.  GASTROINTESTINAL  Lab 05/05/12 0420 05/04/12 1517  AST 24 18  ALT 33 39  ALKPHOS 72 77  BILITOT 0.9 0.8  PROT 5.6* 5.5*  ALBUMIN 2.3* 2.3*   A:  1) GERD 2) Diarrhea - resolved. C. Diff negative. 3) Colonic Ileus > resolved 4) Paraesophageal nodes - need FU CT in 3-4 mnths P: - Continue TF, Pepcid - Minimize narcotics. - Failed Swallow eval again 1/31, NGT ordered but  deferred 2/2 since he has pulled 2 GT already. Suspect he will need PEG next week, IR evaluating for this but ileus remains the issue.  The family evidently also does not wish for him to have a PEG, RN to schedule meeting with MD to clarify this point.  HEMATOLOGIC  Lab 05/11/12 0330 05/10/12 0445  WBC 16.8* 12.8*  HGB 16.4 14.5  HCT 47.3 43.6  MCV 88.2 89.7  PLT 230 216   A:  1) Leukocytosis -  no fever. 3) Anticoagulation in setting of atrial fibrillation - New diagnosis. CHADS 3. Coumadin on home. On heparin gtt. 4) Bilateral PE 5) Bilateral DVT - IVC filter placement 1/27 P:  - IV heparin for VTE's - No coumadin for now until PEG is in place. - SCD  INFECTIOUS  Lab 05/11/12 0330 05/10/12 0445  WBC 16.8* 12.8*  NEUTROABS -- --   A: 1) Flu pneumonia - Flu A and metapneumovirus > finished Tamiflu x 10d, droplet isolation d/c 1/18 2) Aspirated at time of intubation, pneumonitis vs PNA> finished Rocephin x8d. Aspirated during reintubation.  3) Nosocomial infection - Left IJ (in 8d) dc'ed 1/13 and cath tip cultures>staph/coag neg > tx finished Vanc  4) Diarrhea - resolved. 5) Leukocytosis - likely 2/2 steroids. Ceftaz 1/18 --> 1/29 w/ sputum cx negative. Procalcitonin 0.14 --> 0.15. P: Continue to monitor for s/s of additional infection, observe off abx  ENDOCRINE  Lab 05/11/12 0850 05/11/12 0331 05/11/12 0043 05/10/12 1959 05/10/12 1716  GLUCAP 157* 122* 102* 105* 165*   A:  1) Diabetes mellitus, type 2 (A1c 7.1) - possible iatrogenic secondary to prolonged steroid use. 2) On steroids - CBGs at goal. Lantus off. P: ICU Hyperglycemic protocol  NEUROLOGIC / PSYCHIATRIC   A:  1) Delerium - persistent, controlled on scheduled ativan, haldol and fentanyl, prn versed  P:  - Again, goal to stay off sedating drips - Continue scheduled haldol and decrease PRN fentanyl for now given persistent ileus. - Swallow study - when delirium is better under control. - Monitor QTc -  haldol 5 q6 standing - Does not qualify for LTAC, SNF/trach beds requested (possibly out of state) awaiting input from case management. - PT/ OT consult. - IV Ativan. - CIR eval - good candidate once acute issues resolved primarily delirium.  GLOBAL:  - transfer to SDU once able to get of dilt drip, will start PO dilt 2/3.   CLINICAL SUMMARY: 44 yoM with COPD (3ppd smoking) admitted 1/5 with VDRF 2/2 influenza A and asp PNA, intubated 04/13/11 -  1/15 for AW protection, aspirated during intubation. Reintubated emergently 1/18 for severe delerium & resp distress, purulent secretions noted. Trach 1/22. New PE, DVT BL, s/p IVF filter 1/27. On hep gtt. Ileus resolved. Needs PEG but evidently family did not want him to have one, RN to arrange for meeting with MD inorder to discuss plan of care as patient will not progress without a PEG.  Alyson Reedy, M.D. [facility] Pulmonary/Critical Care Medicine. Pager: [phone]. After hours pager: [phone].

## 2012-05-11 NOTE — Progress Notes (Signed)
Patient ID: Cody [name], male   DOB: 1950-04-26, 62 y.o.   MRN: [id number]   G tube request has been made last week We have been on hold awaiting Pt wbc and temp to be wnl  Wbc trending up but now on steroids afeb Wbc wnl  Also need colonic ileus to resolve  Will check KUB in am Potential move forward with Perc G tube in 24-48 hrs

## 2012-05-12 ENCOUNTER — Inpatient Hospital Stay (HOSPITAL_COMMUNITY): Payer: Medicaid Other

## 2012-05-12 LAB — BASIC METABOLIC PANEL
BUN: 13 mg/dL (ref 6–23)
Calcium: 9.1 mg/dL (ref 8.4–10.5)
Creatinine, Ser: 0.51 mg/dL (ref 0.50–1.35)
GFR calc Af Amer: >90 mL/min (ref >90)
GFR calc non Af Amer: >90 mL/min (ref >90)
Glucose, Bld: 85 mg/dL (ref 70–99)
Potassium: 3.5 mEq/L (ref 3.5–5.1)

## 2012-05-12 LAB — CBC
Platelets: 210 10*3/uL (ref 150–400)
RBC: 5.38 MIL/uL (ref 4.22–5.81)
RDW: 16.4 % — ABNORMAL HIGH (ref 11.5–15.5)
WBC: 23.8 10*3/uL — ABNORMAL HIGH (ref 4.0–10.5)

## 2012-05-12 LAB — GLUCOSE, CAPILLARY
Glucose-Capillary: 109 mg/dL — ABNORMAL HIGH (ref 70–99)
Glucose-Capillary: 113 mg/dL — ABNORMAL HIGH (ref 70–99)
Glucose-Capillary: 178 mg/dL — ABNORMAL HIGH (ref 70–99)

## 2012-05-12 LAB — HEPARIN LEVEL (UNFRACTIONATED): Heparin Unfractionated: 0.5 IU/mL (ref 0.30–0.70)

## 2012-05-12 MED ORDER — ALTEPLASE 100 MG IV SOLR
2.0000 mg | Freq: Once | INTRAVENOUS | Status: AC
  Administered 2012-05-12: 2 mg
  Filled 2012-05-12: qty 2

## 2012-05-12 MED ORDER — ALTEPLASE 100 MG IV SOLR
2.0000 mg | Freq: Once | INTRAVENOUS | Status: DC
  Filled 2012-05-12: qty 2

## 2012-05-12 MED ORDER — "THROMBI-PAD 3""X3"" EX PADS"
1.0000 | MEDICATED_PAD | Freq: Once | CUTANEOUS | Status: AC
  Administered 2012-05-13: 1 via TOPICAL
  Filled 2012-05-12: qty 1

## 2012-05-12 MED ORDER — POTASSIUM CHLORIDE 20 MEQ/15ML (10%) PO LIQD
40.0000 meq | Freq: Three times a day (TID) | ORAL | Status: AC
  Administered 2012-05-12 (×2): 40 meq
  Filled 2012-05-12 (×2): qty 30

## 2012-05-12 NOTE — Progress Notes (Signed)
Clinical Social Work  Patient working with RT and no family present. CSW followed up with considering SNF options and sent additional information to [facility], [facility], [facility] and [facility]. SNFs report that patient has to be below 28% before they can accept patient. CSW will continue to follow.  [place], [place] [phone]

## 2012-05-12 NOTE — Progress Notes (Signed)
RN had just suctioned pt with copious amts of thick secretions.

## 2012-05-12 NOTE — Progress Notes (Signed)
ANTICOAGULATION CONSULT NOTE - Follow Up Consult  Pharmacy Consult for heparin Indication: DVT/PE  Allergies  Allergen Reactions  . Protamine Anaphylaxis    Patient Measurements: Height: 5\' 10"  (177.8 cm) Weight: 190 lb 4.1 oz (86.3 kg) IBW/kg (Calculated) : 73    Vital Signs: Temp: 100.7 F (38.2 C) (02/04 0800) Temp src: Oral (02/04 0800) BP: 161/101 mmHg (02/04 0800) Pulse Rate: 111  (02/04 0814)  Labs:  Basename 05/12/12 0350 05/11/12 0950 05/11/12 0330 05/10/12 0445  HGB 15.9 -- 16.4 --  HCT 46.9 -- 47.3 43.6  PLT 210 -- 230 216  APTT -- -- -- --  LABPROT -- -- -- --  INR -- -- -- --  HEPARINUNFRC 0.50 0.58 0.82* --  CREATININE 0.51 -- 0.50 0.46*  CKTOTAL -- -- -- --  CKMB -- -- -- --  TROPONINI -- -- -- --    Estimated Creatinine Clearance: 97.6 ml/min (by C-G formula based on Cr of 0.51).   Medications:  Scheduled:     . [COMPLETED] alteplase  2 mg Intracatheter Once  . [COMPLETED] alteplase  2 mg Intracatheter Once  . [COMPLETED] alteplase  2 mg Intracatheter Once  . amiodarone  400 mg Oral Daily  . antiseptic oral rinse  15 mL Mouth Rinse QID  . chlorhexidine  15 mL Mouth Rinse BID  . clonazePAM  1 mg Per Tube BID  . diltiazem  30 mg Per Tube Q6H  . famotidine (PEPCID) IV  20 mg Intravenous Daily  . haloperidol lactate  5 mg Intravenous Q6H  . insulin aspart  0-15 Units Subcutaneous Q4H  . ipratropium  0.5 mg Nebulization Q6H  . levalbuterol  0.63 mg Nebulization Q6H  . multivitamin  5 mL Per Tube Daily  . predniSONE  30 mg Per Tube Daily  . sodium chloride  10-40 mL Intracatheter Q12H  . [DISCONTINUED] clonazePAM  1 mg Oral BID  . [DISCONTINUED] LORazepam  1 mg Intravenous Q12H  . [DISCONTINUED] methylPREDNISolone (SOLU-MEDROL) injection  40 mg Intravenous Q24H   Infusions:     . sodium chloride 20 mL/hr at 05/12/12 0304  . sodium chloride 10 mL/hr at 05/10/12 2125  . [EXPIRED] diltiazem (CARDIZEM) infusion Stopped (05/11/12 1428)  .  heparin 2,300 Units/hr (05/11/12 2149)    Assessment: 62 yo male with new PE/DVT (s/p IVC filter on 1/27). Heparin level is at goal (HL= 0.50) and hg/hct= 15.9/46.9. Coumadin to start once PEG in place.   Goal of Therapy:  Heparin level 0.3-0.7 units/ml Monitor platelets by anticoagulation protocol: Yes   Plan:  -Continue heparin at 2300 units/hr -Heparin level and CBC in am   Harland German, Pharm D 05/12/2012 8:59 AM

## 2012-05-12 NOTE — Progress Notes (Signed)
CIR Admissions follow-up visit. Family not present, patient sleeping. Spoke with RN. Noted pt continues with multiple medical issues and not ready for CIR at this time. Will continue to follow.  Please call [phone] for questions.

## 2012-05-12 NOTE — Progress Notes (Signed)
PULMONARY  / CRITICAL CARE MEDICINE  Name: Cody [name] MRN: [id number] DOB: [birth year]    LOS: 30  REFERRING MD:   EDP - Dr. Preston Fleeting  CHIEF COMPLAINT:  Shortness of breath  BRIEF PATIENT DESCRIPTION: 58 yoM with COPD (3ppd smoking) admitted 1/5 with VDRF 2/2 influenza A and asp PNA, intubated 04/13/11 - 1/15 for AW protection, aspirated during intubation. Reintubated emergently 1/18 for severe delerium & resp distress, purulent secretions noted. Trach 1/22. New PE, DVT BL, s/p IVF filter 1/27. On hep gtt.   LINES / TUBES: ETT 1/5 >>1/15 , 1/18 >> self extubated 1/21 >>> reintubated 1/21 L IJ 1/5 >> 1/13 R IJ 1/13 >> 1/16 RIJ 1/18 >> 1/28 R A-line 1/5 >>1/8 pulled out by pt Trach 1/22 IVC Filter 1/27 >> PICC 1/28 >>  CULTURES: Blood culture 1/5>> Negative Resp culture 1/6>> H. influenzae  Resp viral panel 1/6>> Influenza A and Metapneumovirus  Sputum Leigonella Culture 1/6>> Negative Bronch Sputum 1/6>> H. influenzae  Flu 1/5>> Negative Blood culture 01/13 >> Negative Catheter tip culture 1/13 >>Staph -coag neg-70 colonies(vanc sens)  C.diff PCR 1/14 >> Negative Resp culture 1/18 >>> Negative  ANTIBIOTICS: Zosyn 1/5>>1/9 - restarted 1/22 >>1/28 Azithro 1/5>>1/8  Vanc 1/6>>>1/8 - restarted 1/13 >>1/25 Tamiflu 1/6>>>1/16  Rocephin 1/9>>1/15 Ceftazidine 1/18 >>1/22   SIGNIFICANT EVENTS:  1/5 Intubated in ED  1/5 Shock , levophed  1/6 ECHO - 55%, mod dil rv, PA pressures>>> not measured b/c no TR jet  1/6 Bronched- diffuse pus all lobes  1/7 increasing pCO2, increased RR on vent  1/8 Changed sedation to Precedex, more agitated than with versed  1/9 Changed sedation back to Versed, agitation greatly improved  1/10 scheduled haldol  1/12 Restarting precedex gtt due to persistent agitation 1/13 Febrile to 102.6 overnight. ? Catheter infection? DC'ed L IJ, placed new R IJ. 1/14 No fevers overnight, agitation controlled on propofol (added 1/13), more interactive. 1/15 Had  foul smelling stools overnight, therefore, C.diff PCR was sent by RN. 1/15 EXTUBATED, developed A.fib with RVR overnight. 1/16 Persistent episodes of desaturation., afib>started on cardizem drip  1/17 increased agitation  1/18 reintubated  1/20 Persistent A. Fib this morning on dilt drip.  Rates controlled between 99-125 1/21 Self extubated, given a chance and continued to be in respiratory distress so reintubated.  1/22 Tracheostomy, complicated by bleeding, angio edema from protamine 1/23 Continued agitation. 1/23 Neg acute ct head 1/24 PEG placement planned --> delayed bc of ileus 1/25 CTA showing PE BL --> started on heparin gtt. 1/26 BL LE DVT with mobile thrombus 1/27 IVC Filter placement. 1/28 PICC Line 1/29 Persistent ileus per abdominal x-ray. Attempting to limit narcotics. And 1/30 Tolerated PMSV placement for 15 minutes and fatigue and    LEVEL OF CARE:  ICU PRIMARY SERVICE:  PCCM CONSULTANTS:  None CODE STATUS: Full DIET:  TF on hold as repeatedly pulling tubes out. DVT Px:  Heparin gtt GI Px:  Protonix   SUBJECTIVE / INTERVAL HISTORY:  Patient indicates no pain, nausea, vomiting, shortness of breath  Interval Events: - Doing TC 24x7 - difficulty with PMV - failed FEES 1/31 - dilt gtt at 10 - request made for NGT per IR 2/1 pm  VITAL SIGNS: Temp:  [98.1 F (36.7 C)-100.7 F (38.2 C)] 100.7 F (38.2 C) (02/04 0800) Pulse Rate:  [73-116] 111  (02/04 0814) Resp:  [20-31] 27  (02/04 0814) BP: (117-179)/(77-123) 161/101 mmHg (02/04 0800) SpO2:  [87 %-96 %] 93 % (02/04 0814) FiO2 (%):  [  40 %] 40 % (02/04 0814) Weight:  [86.3 kg (190 lb 4.1 oz)] 86.3 kg (190 lb 4.1 oz) (02/04 0400) HEMODYNAMICS: VENTILATOR SETTINGS: Vent Mode:  [-]  FiO2 (%):  [40 %] 40 % INTAKE / OUTPUT:  Intake/Output Summary (Last 24 hours) at 05/12/12 1610 Last data filed at 05/12/12 0700  Gross per 24 hour  Intake 1147.42 ml  Output    500 ml  Net 647.42 ml    PHYSICAL  EXAMINATION: General: Tracheostomy in place, on ventilator, opens eyes to command and tracks around the room but not following any command.  Head: Normocephalic, Trach in place, no bleeding.  Lungs:  Diminished BS, coarse breath sounds clearing.Marland Kitchen  Heart: Irregularly irregular rhythm, regular rate.  Abdomen:  BS normoactive. Soft, Nondistended, non-tender.  No masses or organomegaly.  Extremities: No pretibial edema.     IMAGING:  1/30 - NG tube coiled overlying the proximal stomach.gaseous distention again noted.   1/25 - CTA chest - 1. Pulmonary embolus within segmental branches to the right upper lobe, left upper lobe and left lower lobe, extending distally. 2. Bilateral lower lobe airspace opacification is thought to reflect atelectasis, with trace bilateral pleural effusions. No definite evidence for aspiration. 3. Mild nonspecific left upper lobe opacities may reflect atelectasis.  1/26 - LE Dopplers - 1. Acute DVT right saphenofemoral junction, common femoral vein. 2. Acute DVT left saphenofemoral junction, left femoral vein, and mobile thrombus in the left common femoral vein.   DIAGNOSES: Principal Problem:  *Acute respiratory failure with hypercapnia Active Problems:  HYPERLIPIDEMIA  TOBACCO USE  HYPERTENSION  GERD  COPD (chronic obstructive pulmonary disease)  Acute encephalopathy  Acute kidney injury  Atrial fibrillation  PE (pulmonary embolism)  ASSESSMENT / PLAN:  PULMONARY A: 1) Acute hypercapnic respiratory failure/ARDS - secondary to flu, COPD exacerbation. Trach 1/22. 2) Aspiration - at time of intubation, finished course of treatment  3) Pneumonitis vs. CAP - b/l interstitial opacities on cxr, slightly increased at left, likely atelectasis. Completed 8/8 days rocephin, on tapering steroids  4) Pulmonary edema - improved. 4) Non-oxygen dependent COPD - h/o chronic tobacco abuse hx 3 pack per day 20+ years.  5) Bloody secretions from aspiration - Pulm /renal  syndrome ruled out. 6) Upper airway severe angioedema & rash from protamine  7) Likley small hematoma on rt apex from neck oozing  P:  - ATC as tolerated, goal 24x7 - Duonebs q6h + q2h PRN. - Continue steroids for rash - D/C solumedrol and use prednisone 30 mg PO daily. - Continue attempts PMSV, following command is the primary issue here now.  CARDIOVASCULAR  A:  1) New onset Afib with RVR - no longer rate controlled. Unable to take oral amiodarone. On cardizem, amio. CE neg x 3. On heparin gtt. ? Back to NSR on 2/1 2) Hypotension - mild. With hx of HTN. 3) Anaphylaxis to protamine - protamine given in setting of trach placement, resultant anaphylactic reaction - resolved.  P:  - Goal HR < 120, cardizem gtt, currently at 10.-change to PO when able - Resume amio PO when able, may need amio IV if sustains AF - Attempt to transition IV to PO dilt, will start with 30 q6 hours PO today since NGT is in.  RENAL  Lab 05/12/12 0350 05/11/12 0330 05/10/12 0445  NA 137 131* --  K 3.5 3.7 --  CL 99 94* --  CO2 27 28 --  BUN 13 13 --  CREATININE 0.51 0.50 --  GLUCOSE  85 245* --  MG 1.8 -- 1.9  PHOS 3.2 -- 2.9   A:  1) Acute Kidney injury - Resolved. Previously likely pre-renal in setting of volume depletion. Previously urinary sediment clogging foley, improved. Continue to monitor in setting of lasix use. 2) Hypernatremia - resolved  3) Hypokalemia - Previously in setting of lasix no in setting of NPO.  P:  - Repleting IV KCl. - Goal to keep even.  GASTROINTESTINAL No results found for this basename: AST:3,ALT:3,ALKPHOS:3,BILITOT:3,PROT:3,ALBUMIN:3 in the last 168 hours A:  1) GERD 2) Diarrhea - resolved. C. Diff negative. 3) Colonic Ileus > resolved 4) Paraesophageal nodes - need FU CT in 3-4 mnths P: - Continue TF, Pepcid - Minimize narcotics. - Failed Swallow eval again 1/31, NGT ordered but deferred 2/2 since he has pulled 2 GT already. Suspect he will need PEG next week,  IR evaluating for this but ileus remains the issue.  The family evidently also does not wish for him to have a PEG, RN to schedule meeting with MD to clarify this point.  HEMATOLOGIC  Lab 05/12/12 0350 05/11/12 0330  WBC 23.8* 16.8*  HGB 15.9 16.4  HCT 46.9 47.3  MCV 87.2 88.2  PLT 210 230   A:  1) Leukocytosis -  no fever. 3) Anticoagulation in setting of atrial fibrillation - New diagnosis. CHADS 3. Coumadin on home. On heparin gtt. 4) Bilateral PE 5) Bilateral DVT - IVC filter placement 1/27 P:  - IV heparin for VTE's - No coumadin for now until PEG is in place. - SCD  INFECTIOUS  Lab 05/12/12 0350 05/11/12 0330  WBC 23.8* 16.8*  NEUTROABS -- --   A: 1) Flu pneumonia - Flu A and metapneumovirus > finished Tamiflu x 10d, droplet isolation d/c 1/18 2) Aspirated at time of intubation, pneumonitis vs PNA> finished Rocephin x8d. Aspirated during reintubation.  3) Nosocomial infection - Left IJ (in 8d) dc'ed 1/13 and cath tip cultures>staph/coag neg > tx finished Vanc  4) Diarrhea - resolved. 5) Leukocytosis - likely 2/2 steroids. Ceftaz 1/18 --> 1/29 w/ sputum cx negative. Procalcitonin 0.14 --> 0.15. P: Continue to monitor for s/s of additional infection, observe off abx  ENDOCRINE  Lab 05/12/12 0833 05/12/12 0437 05/11/12 2356 05/11/12 2014 05/11/12 1718  GLUCAP 113* 109* 121* 129* 174*   A:  1) Diabetes mellitus, type 2 (A1c 7.1) - possible iatrogenic secondary to prolonged steroid use. 2) On steroids - CBGs at goal. Lantus off. P: ICU Hyperglycemic protocol  NEUROLOGIC / PSYCHIATRIC   A:  1) Delerium - persistent, controlled on scheduled ativan, haldol and fentanyl, prn versed  P:  - Again, goal to stay off sedating drips - Continue scheduled haldol and decrease PRN fentanyl for now given persistent ileus. - Swallow study - when delirium is better under control. - Monitor QTc - haldol 5 q6 standing - Does not qualify for LTAC, SNF/trach beds requested (possibly  out of state) awaiting input from case management. - PT/ OT consult. - IV Ativan. - CIR eval - good candidate once acute issues resolved primarily delirium.  GLOBAL:  - transfer to SDU once able to get of dilt drip, will start PO dilt 2/3.   CLINICAL SUMMARY: 17 yoM with COPD (3ppd smoking) admitted 1/5 with VDRF 2/2 influenza A and asp PNA, intubated 04/13/11 - 1/15 for AW protection, aspirated during intubation. Reintubated emergently 1/18 for severe delerium & resp distress, purulent secretions noted. Trach 1/22. New PE, DVT BL, s/p IVF filter 1/27. On  hep gtt. Ileus resolved. Needs PEG but evidently family did not want him to have one and he has an ileus making placement difficult at this time, RN to arrange for meeting with MD inorder to discuss plan of care as patient will not progress without a PEG.  Alyson Reedy, M.D. [facility] Pulmonary/Critical Care Medicine. Pager: [phone]. After hours pager: [phone].

## 2012-05-13 DIAGNOSIS — Z930 Tracheostomy status: Secondary | ICD-10-CM

## 2012-05-13 DIAGNOSIS — J9620 Acute and chronic respiratory failure, unspecified whether with hypoxia or hypercapnia: Secondary | ICD-10-CM

## 2012-05-13 LAB — URINALYSIS, ROUTINE W REFLEX MICROSCOPIC
Glucose, UA: NEGATIVE mg/dL
Specific Gravity, Urine: 1.027 (ref 1.005–1.030)
Urobilinogen, UA: 2 mg/dL — ABNORMAL HIGH (ref 0.0–1.0)
pH: 5 (ref 5.0–8.0)

## 2012-05-13 LAB — PHOSPHORUS: Phosphorus: 3.2 mg/dL (ref 2.3–4.6)

## 2012-05-13 LAB — HEPARIN LEVEL (UNFRACTIONATED): Heparin Unfractionated: 0.42 IU/mL (ref 0.30–0.70)

## 2012-05-13 LAB — GLUCOSE, CAPILLARY
Glucose-Capillary: 162 mg/dL — ABNORMAL HIGH (ref 70–99)
Glucose-Capillary: 128 mg/dL — ABNORMAL HIGH (ref 70–99)
Glucose-Capillary: 124 mg/dL — ABNORMAL HIGH (ref 70–99)
Glucose-Capillary: 133 mg/dL — ABNORMAL HIGH (ref 70–99)
Glucose-Capillary: 147 mg/dL — ABNORMAL HIGH (ref 70–99)
Glucose-Capillary: 124 mg/dL — ABNORMAL HIGH (ref 70–99)

## 2012-05-13 LAB — CBC
MCV: 87.9 fL (ref 78.0–100.0)
Platelets: 145 10*3/uL — ABNORMAL LOW (ref 150–400)
RBC: 5.2 MIL/uL (ref 4.22–5.81)
RDW: 16.8 % — ABNORMAL HIGH (ref 11.5–15.5)
WBC: 30 10*3/uL — ABNORMAL HIGH (ref 4.0–10.5)

## 2012-05-13 LAB — MAGNESIUM: Magnesium: 1.8 mg/dL (ref 1.5–2.5)

## 2012-05-13 LAB — BASIC METABOLIC PANEL
CO2: 25 mEq/L (ref 19–32)
Chloride: 100 mEq/L (ref 96–112)
Creatinine, Ser: 0.51 mg/dL (ref 0.50–1.35)
GFR calc Af Amer: >90 mL/min (ref >90)
Sodium: 138 mEq/L (ref 135–145)

## 2012-05-13 LAB — URINE MICROSCOPIC-ADD ON

## 2012-05-13 MED ORDER — POTASSIUM CHLORIDE 20 MEQ/15ML (10%) PO LIQD
40.0000 meq | Freq: Three times a day (TID) | ORAL | Status: AC
  Administered 2012-05-13 (×2): 40 meq
  Filled 2012-05-13 (×2): qty 30

## 2012-05-13 MED ORDER — POTASSIUM CHLORIDE 20 MEQ/15ML (10%) PO LIQD
ORAL | Status: AC
  Filled 2012-05-13: qty 30

## 2012-05-13 NOTE — Progress Notes (Signed)
PULMONARY  / CRITICAL CARE MEDICINE  Name: Cody [name] MRN: [id number] DOB: [birth year]    LOS: 31  REFERRING MD:   EDP - Dr. Preston Fleeting  CHIEF COMPLAINT:  Shortness of breath  BRIEF PATIENT DESCRIPTION: 60 yoM with COPD (3ppd smoking) admitted 1/5 with VDRF 2/2 influenza A and asp PNA, intubated 04/13/11 - 1/15 for AW protection, aspirated during intubation. Reintubated emergently 1/18 for severe delerium & resp distress, purulent secretions noted. Trach 1/22. New PE, DVT BL, s/p IVF filter 1/27. On hep gtt.   LINES / TUBES: ETT 1/5 >>1/15 , 1/18 >> self extubated 1/21 >>> reintubated 1/21 L IJ 1/5 >> 1/13 R IJ 1/13 >> 1/16 RIJ 1/18 >> 1/28 R A-line 1/5 >>1/8 pulled out by pt Trach 1/22 IVC Filter 1/27 >> PICC 1/28 >>  CULTURES: Blood culture 1/5>> Negative Resp culture 1/6>> H. influenzae  Resp viral panel 1/6>> Influenza A and Metapneumovirus  Sputum Leigonella Culture 1/6>> Negative Bronch Sputum 1/6>> H. influenzae  Flu 1/5>> Negative Blood culture 01/13 >> Negative Catheter tip culture 1/13 >>Staph -coag neg-70 colonies(vanc sens)  C.diff PCR 1/14 >> Negative Resp culture 1/18 >>> Negative  ANTIBIOTICS: Zosyn 1/5>>1/9 - restarted 1/22 >>1/28 Azithro 1/5>>1/8  Vanc 1/6>>>1/8 - restarted 1/13 >>1/25 Tamiflu 1/6>>>1/16  Rocephin 1/9>>1/15 Ceftazidine 1/18 >>1/22  SIGNIFICANT EVENTS:  1/5 Intubated in ED  1/5 Shock , levophed  1/6 ECHO - 55%, mod dil rv, PA pressures>>> not measured b/c no TR jet  1/6 Bronched- diffuse pus all lobes  1/7 increasing pCO2, increased RR on vent  1/8 Changed sedation to Precedex, more agitated than with versed  1/9 Changed sedation back to Versed, agitation greatly improved  1/10 scheduled haldol  1/12 Restarting precedex gtt due to persistent agitation 1/13 Febrile to 102.6 overnight. ? Catheter infection? DC'ed L IJ, placed new R IJ. 1/14 No fevers overnight, agitation controlled on propofol (added 1/13), more interactive. 1/15 Had  foul smelling stools overnight, therefore, C.diff PCR was sent by RN. 1/15 EXTUBATED, developed A.fib with RVR overnight. 1/16 Persistent episodes of desaturation., afib>started on cardizem drip  1/17 increased agitation  1/18 reintubated  1/20 Persistent A. Fib this morning on dilt drip.  Rates controlled between 99-125 1/21 Self extubated, given a chance and continued to be in respiratory distress so reintubated.  1/22 Tracheostomy, complicated by bleeding, angio edema from protamine 1/23 Continued agitation. 1/23 Neg acute ct head 1/24 PEG placement planned --> delayed bc of ileus 1/25 CTA showing PE BL --> started on heparin gtt. 1/26 BL LE DVT with mobile thrombus 1/27 IVC Filter placement. 1/28 PICC Line 1/29 Persistent ileus per abdominal x-ray. Attempting to limit narcotics. And 1/30 Tolerated PMSV placement for 15 minutes and fatigue and   LEVEL OF CARE:  ICU PRIMARY SERVICE:  PCCM CONSULTANTS:  None CODE STATUS: Full DIET:  TF on hold as repeatedly pulling tubes out. DVT Px:  Heparin gtt GI Px:  Protonix  SUBJECTIVE / INTERVAL HISTORY:  Remains confused, bleeding overnight from trach site.  Interval Events: - Doing TC 24x7 - difficulty with PMV - failed FEES 1/31 - dilt gtt at 10 - request made for NGT per IR 2/1 pm  VITAL SIGNS: Temp:  [97.8 F (36.6 C)-99 F (37.2 C)] 97.9 F (36.6 C) (02/05 1209) Pulse Rate:  [100-127] 105  (02/05 1100) Resp:  [20-28] 20  (02/05 0802) BP: (97-179)/(65-100) 134/79 mmHg (02/05 1100) SpO2:  [90 %-96 %] 90 % (02/05 1207) FiO2 (%):  [40 %] 40 % (  02/05 1207) HEMODYNAMICS: VENTILATOR SETTINGS: Vent Mode:  [-]  FiO2 (%):  [40 %] 40 % INTAKE / OUTPUT:  Intake/Output Summary (Last 24 hours) at 05/13/12 1312 Last data filed at 05/13/12 1100  Gross per 24 hour  Intake    737 ml  Output    175 ml  Net    562 ml   PHYSICAL EXAMINATION: General: Tracheostomy in place, on ventilator, opens eyes to command and tracks around the  room but not following any command.  Head: Normocephalic, Trach in place, no bleeding.  Lungs:  Diminished BS, coarse breath sounds clearing.Marland Kitchen  Heart: Irregularly irregular rhythm, regular rate.  Abdomen:  BS normoactive. Soft, Nondistended, non-tender.  No masses or organomegaly.  Extremities: No pretibial edema.    IMAGING:  1/30 - NG tube coiled overlying the proximal stomach.gaseous distention again noted.   1/25 - CTA chest - 1. Pulmonary embolus within segmental branches to the right upper lobe, left upper lobe and left lower lobe, extending distally. 2. Bilateral lower lobe airspace opacification is thought to reflect atelectasis, with trace bilateral pleural effusions. No definite evidence for aspiration. 3. Mild nonspecific left upper lobe opacities may reflect atelectasis.  1/26 - LE Dopplers - 1. Acute DVT right saphenofemoral junction, common femoral vein. 2. Acute DVT left saphenofemoral junction, left femoral vein, and mobile thrombus in the left common femoral vein.   DIAGNOSES: Principal Problem:  *Acute respiratory failure with hypercapnia Active Problems:  HYPERLIPIDEMIA  TOBACCO USE  HYPERTENSION  GERD  COPD (chronic obstructive pulmonary disease)  Acute encephalopathy  Acute kidney injury  Atrial fibrillation  PE (pulmonary embolism)  ASSESSMENT / PLAN:  PULMONARY A: 1) Acute hypercapnic respiratory failure/ARDS - secondary to flu, COPD exacerbation. Trach 1/22. 2) Aspiration - at time of intubation, finished course of treatment  3) Pneumonitis vs. CAP - b/l interstitial opacities on cxr, slightly increased at left, likely atelectasis. Completed 8/8 days rocephin, on tapering steroids  4) Pulmonary edema - improved. 4) Non-oxygen dependent COPD - h/o chronic tobacco abuse hx 3 pack per day 20+ years.  5) Bloody secretions from aspiration - Pulm /renal syndrome ruled out. 6) Upper airway severe angioedema & rash from protamine  7) Likley small hematoma on rt  apex from neck oozing  P:  - ATC as tolerated, 24x7 - Duonebs q6h + albuterol q2h PRN. - Continue steroids for rash - D/C solumedrol and use prednisone 30 mg PO daily. - Continue attempts PMSV, following command is the primary issue here that is obstructing that goal. - Hold heparin due to hemoptysis and bleeding from trach, will re-evaluate in AM.  CARDIOVASCULAR  A:  1) New onset Afib with RVR - Unable to take oral amiodarone. On cardizem, amio. CE neg x 3. On heparin gtt. ? Back to NSR on 2/1 2) Hypotension - mild. With hx of HTN. 3) Anaphylaxis to protamine - protamine given in setting of trach placement, resultant anaphylactic reaction - resolved.  P:  - Goal HR < 120, cardizem gtt, currently at 10.-change to PO when able - Resume amio PO when able, may need amio IV if sustains AF - Continue PO dilt, 30 q6 hours PO today since NGT is in. - Anti-coagulation held for PE, DVT and a-fib due to trach bleeding and hemoptysis.  RENAL  Lab 05/13/12 0435 05/12/12 0350  NA 138 137  K 3.6 3.5  CL 100 99  CO2 25 27  BUN 13 13  CREATININE 0.51 0.51  GLUCOSE 118* 85  MG 1.8 1.8  PHOS 3.2 3.2   A:  1) Acute Kidney injury - Resolved. Previously likely pre-renal in setting of volume depletion. Previously urinary sediment clogging foley, improved. Continue to monitor in setting of lasix use. 2) Hypernatremia - resolved  3) Hypokalemia - Previously in setting of lasix no in setting of NPO.  P:  - Repleting IV KCl as needed. - Goal to keep even.  GASTROINTESTINAL No results found for this basename: AST:3,ALT:3,ALKPHOS:3,BILITOT:3,PROT:3,ALBUMIN:3 in the last 168 hours A:  1) GERD 2) Diarrhea - resolved. C. Diff negative. 3) Colonic Ileus > resolved 4) Paraesophageal nodes - need FU CT in 3-4 mnths P: - Continue TF, Pepcid. - Minimize narcotics. - Failed Swallow eval again 1/31, NGT ordered but deferred 2/2 since he has pulled 2 GT already. Will meet with family today to confirm  wishes for a PEG then will call IR for PEG placement now that ileus has resolved.  HEMATOLOGIC  Lab 05/13/12 0435 05/12/12 0350  WBC 30.0* 23.8*  HGB 15.8 15.9  HCT 45.7 46.9  MCV 87.9 87.2  PLT 145* 210   A:  1) Leukocytosis -  no fever. 3) Anticoagulation in setting of atrial fibrillation - New diagnosis. CHADS 3. Coumadin on home. On heparin gtt. 4) Bilateral PE 5) Bilateral DVT - IVC filter placement 1/27 P:  - IV heparin for VTE's - No coumadin for now until PEG is in place. - SCD  INFECTIOUS  Lab 05/13/12 0435 05/12/12 0350  WBC 30.0* 23.8*  NEUTROABS -- --   A: 1) Flu pneumonia - Flu A and metapneumovirus > finished Tamiflu x 10d, droplet isolation d/c 1/18 2) Aspirated at time of intubation, pneumonitis vs PNA> finished Rocephin x8d. Aspirated during reintubation.  3) Nosocomial infection - Left IJ (in 8d) dc'ed 1/13 and cath tip cultures>staph/coag neg > tx finished Vanc  4) Diarrhea - resolved. 5) Leukocytosis - likely 2/2 steroids. Ceftaz 1/18 --> 1/29 w/ sputum cx negative. Procalcitonin 0.14 --> 0.15. WBC rising with low grade fever. P: Reculture 2/5 and if shows signs of occult infections then will start broad spectrum abx.  ENDOCRINE  Lab 05/13/12 1212 05/13/12 0821 05/13/12 0457 05/13/12 0112 05/12/12 2103  GLUCAP 147* 133* 124* 128* 119*   A:  1) Diabetes mellitus, type 2 (A1c 7.1) - possible iatrogenic secondary to prolonged steroid use. 2) On steroids - CBGs at goal. Lantus off. P: ICU Hyperglycemic protocol  NEUROLOGIC / PSYCHIATRIC   A:  1) Delerium - persistent, controlled on scheduled ativan, haldol and fentanyl, prn versed  P:  - Again, goal to stay off sedating drips - Continue scheduled haldol and decrease PRN fentanyl for now given persistent ileus. - Swallow study - when delirium is better under control. - Monitor QTc - haldol 5 q6 standing - Does not qualify for LTAC, SNF/trach beds requested (possibly out of state) awaiting input  from case management. - PT/ OT consult. - IV Ativan. - CIR eval - good candidate once acute issues resolved primarily delirium.  GLOBAL:  - Hold in SDU, will place PEG after meeting with family and begin search for SNF.   CLINICAL SUMMARY: 76 yoM with COPD (3ppd smoking) admitted 1/5 with VDRF 2/2 influenza A and asp PNA, intubated 04/13/11 - 1/15 for AW protection, aspirated during intubation. Reintubated emergently 1/18 for severe delerium & resp distress, purulent secretions noted. Trach 1/22. New PE, DVT BL, s/p IVF filter 1/27. Off heparin drip due to trach bleeding, 2/5 family meeting to decide  on plan of care.  Alyson Reedy, M.D. [facility] Pulmonary/Critical Care Medicine. Pager: [phone]. After hours pager: [phone].

## 2012-05-13 NOTE — Progress Notes (Signed)
Occupational Therapy Treatment Patient Details Name: Cody [name] MRN: [id number] DOB: 15-Aug-1950 Today's Date: 05/13/2012 Time: 7829-5621 OT Time Calculation (min): 40 min  OT Assessment / Plan / Recommendation Comments on Treatment Session Pt requiring more encouragement today to participate in session. Slowly progressing toward goals.    Follow Up Recommendations  CIR    Barriers to Discharge       Equipment Recommendations  3 in 1 bedside comode;Tub/shower bench;Wheelchair (measurements OT)    Recommendations for Other Services Rehab consult  Frequency Min 2X/week   Plan Discharge plan remains appropriate    Precautions / Restrictions Precautions Precautions: Fall Precaution Comments: trach Restrictions Weight Bearing Restrictions: No   Pertinent Vitals/Pain  Trach collar FiO240%, 88-91% throughout session.    ADL  Eating/Feeding: NPO Grooming: Performed;Wash/dry face;Maximal assistance Where Assessed - Grooming: Supported sitting Toilet Transfer: Chief of Staff: Patient Percentage: 10% Statistician Method: Sit to Barista: Other (comment) (bed to chair) Equipment Used: Gait belt Transfers/Ambulation Related to ADLs: +2 total assist (pt=10%) for sit<>stand x2.  Used "3 museketeer" technique to provide bilateral manual facilitation to trunk and hips while blocking knees.  Brought chair up behind pt while standing. ADL Comments: Pt with large BM in bed on OT/PT arrival.  Pt rolled to L/R sides with +2 assist while tech assisted with hygiene and placing clean draw pad beneath hips.  Pt sat EOB ~15 min. Requiring max cueing to initate movement with bed mobility.  During grooming task, pt able to initiate brining washcloth to face with hand over hand assist but not initiating/performing rest of movement tasks for grooming ADL.    OT Diagnosis:    OT Problem List:   OT Treatment Interventions:     OT Goals ADL  Goals Pt Will Perform Grooming: Sitting, chair;Sitting, edge of bed;Unsupported;with min assist ADL Goal: Grooming - Progress: Progressing toward goals Pt Will Transfer to Toilet: with max assist;3-in-1 ADL Goal: Toilet Transfer - Progress: Progressing toward goals Miscellaneous OT Goals Miscellaneous OT Goal #2: Pt will perform bed mobility with mod assist as precursor for EOB ADL retraining. OT Goal: Miscellaneous Goal #2 - Progress: Progressing toward goals Miscellaneous OT Goal #3: Pt will consistently follow one step commands during 75% of therapy session. OT Goal: Miscellaneous Goal #3 - Progress: Progressing toward goals  Visit Information  Last OT Received On: 05/13/12 Assistance Needed: +2 PT/OT Co-Evaluation/Treatment: Yes    Subjective Data      Prior Functioning       Cognition  Cognition Overall Cognitive Status: Difficult to assess Area of Impairment: Following commands;Safety/judgement;Attention;Problem solving Difficult to assess due to: Tracheostomy Arousal/Alertness: Awake/alert Orientation Level: Appears intact for tasks assessed Behavior During Session: Flat affect Current Attention Level: Sustained;Focused Following Commands: Follows one step commands inconsistently Safety/Judgement - Other Comments: not impulsive today Awareness of Errors: Assistance required to correct errors made;Assistance required to identify errors made Cognition - Other Comments: flat affect during session.  became emotional x2 during session.  seems less motivated today and requiring more encouragement to participate.    Mobility  Bed Mobility Bed Mobility: Rolling Left;Rolling Right Rolling Right: 1: +2 Total assist Rolling Right: Patient Percentage: 10% Rolling Left: 1: +2 Total assist Rolling Left: Patient Percentage: 10% Supine to Sit: 1: +2 Total assist Supine to Sit: Patient Percentage: 10% Sitting - Scoot to Edge of Bed: 1: +2 Total assist Sitting - Scoot to Edge of  Bed: Patient Percentage: 10% Details for Bed Mobility Assistance: max  verbal cues to engage patient in activity, very flat needing more assist for all bed mobility; appeared emotional Transfers Transfers: Sit to Stand;Stand to Sit Sit to Stand: 1: +2 Total assist Sit to Stand: Patient Percentage: 10% Stand to Sit: Patient Percentage: 10% Details for Transfer Assistance: less participation today, very flat needing more assist to engage lower extremities in extension and standing activities; bilateral facilitation for anterior translation and extension through trunk     Exercises  General Exercises - Lower Extremity Ankle Circles/Pumps: 5 reps;Both;Supine;PROM;Limitations Ankle Circles/Pumps Limitations: cues for patient engagement in these exercises, pt not initating trying to do the exercise Heel Slides: 5 reps;Both;Supine;Limitations;PROM Heel Slides Limitations: cues for patient engagement, did not feel a lot of initiation with this but when trying to straighten his knees out into extension there was definitely contraction of his hamstrings (not sure if this was volitional)   Balance Static Sitting Balance Static Sitting - Balance Support: Bilateral upper extremity supported Static Sitting - Level of Assistance: 5: Stand by assistance;4: Min assist Static Sitting - Comment/# of Minutes: fluctuated between min-mingaurdA, unable to tolerate any perturbations without loss of balance; cues for tall posture   End of Session OT - End of Session Equipment Utilized During Treatment: Gait belt Activity Tolerance: Patient limited by fatigue Patient left: in chair;with call bell/phone within reach;with nursing in room Nurse Communication: Mobility status  GO    05/13/2012 Cipriano Mile OTR/L Pager [phone] Office [phone]  Cipriano Mile 05/13/2012, 4:25 PM

## 2012-05-13 NOTE — Progress Notes (Signed)
Pt's trach with moderate to copious blooding secretions present since change of shift. All previous chart simply stated that secretions were tan. D/t such changes, charge RN informed and MD on floor came to assess pts condition. Order received and implemented. Pt's vitals continue to remain stable at this time. Will continue to monitor Cody [name], [employer]

## 2012-05-13 NOTE — Progress Notes (Signed)
Physical Therapy Treatment Patient Details Name: Cody [name] MRN: [id number] DOB: February 23, 1951 Today's Date: 05/13/2012 Time: 4098-1191 PT Time Calculation (min): 40 min  PT Assessment / Plan / Recommendation Comments on Treatment Session  Adm sob and resp fail due to flu and pna with vcrf 1/15, 1/18-1/21. Trach and bronchoscopy 1/22. Less participation and engagement in session today. Pt appears depressed and became emotional several times today.     Follow Up Recommendations  CIR     Does the patient have the potential to tolerate intense rehabilitation     Barriers to Discharge        Equipment Recommendations   (tbd at next venue)    Recommendations for Other Services    Frequency Min 3X/week   Plan Discharge plan remains appropriate;Frequency remains appropriate    Precautions / Restrictions Precautions Precautions: Fall Precaution Comments: trach Restrictions Weight Bearing Restrictions: No   Pertinent Vitals/Pain Trach collar 40%, 88-91% throughout session; 93% sitting in chair following session    Mobility  Bed Mobility Bed Mobility: Rolling Left;Rolling Right Rolling Right: 1: +2 Total assist Rolling Right: Patient Percentage: 10% Rolling Left: 1: +2 Total assist Rolling Left: Patient Percentage: 10% Supine to Sit: 1: +2 Total assist Supine to Sit: Patient Percentage: 10% Sitting - Scoot to Edge of Bed: 1: +2 Total assist Sitting - Scoot to Edge of Bed: Patient Percentage: 10% Details for Bed Mobility Assistance: max verbal cues to engage patient in activity, very flat needing more assist for all bed mobility; appeared emotional Transfers Transfers: Sit to Stand;Stand to Sit (x2) Sit to Stand: 1: +2 Total assist Sit to Stand: Patient Percentage: 10% Stand to Sit: Patient Percentage: 10% Stand Pivot Transfers: 1: +2 Total assist Details for Transfer Assistance: less participation today, very flat needing more assist to engage lower extremities in extension  and standing activities; bilateral facilitation for anterior translation and extension through trunk  Ambulation/Gait Ambulation/Gait Assistance: Not tested (comment)    Exercises General Exercises - Lower Extremity Ankle Circles/Pumps: 5 reps;Both;Supine;PROM;Limitations Ankle Circles/Pumps Limitations: cues for patient engagement in these exercises, pt not initating trying to do the exercise Heel Slides: 5 reps;Both;Supine;Limitations;PROM Heel Slides Limitations: cues for patient engagement, did not feel a lot of initiation with this but when trying to straighten his knees out into extension there was definitely contraction of his hamstrings (not sure if this was volitional)    PT Goals Acute Rehab PT Goals Pt will Roll Supine to Right Side: with mod assist PT Goal: Rolling Supine to Right Side - Progress: Revised due to lack of progress Pt will Roll Supine to Left Side: with mod assist PT Goal: Rolling Supine to Left Side - Progress: Revised due to lack of progress PT Goal: Supine/Side to Sit - Progress: Progressing toward goal Pt will Sit at [facility] of Bed: with supervision;3-5 min;with unilateral upper extremity support PT Goal: Sit at Edge Of Bed - Progress: Updated due to goal met PT Goal: Sit to Stand - Progress: Progressing toward goal PT Goal: Stand to Sit - Progress: Progressing toward goal  Visit Information  Last PT Received On: 05/20/12 Assistance Needed: +2    Subjective Data  Subjective: shook his head no today with most of our questions, a lot of blank stares; difficult to understand him because of the trach   Cognition  Cognition Overall Cognitive Status: Difficult to assess Area of Impairment: Following commands;Safety/judgement;Attention;Problem solving Difficult to assess due to: Tracheostomy Arousal/Alertness: Awake/alert Behavior During Session: Flat affect Current Attention Level:  Sustained;Focused Following Commands: Follows one step commands  inconsistently (fatigue and depression (pt emotional today)) Safety/Judgement - Other Comments: not impulsive today Awareness of Errors: Assistance required to correct errors made;Assistance required to identify errors made Cognition - Other Comments: flat affect today with decreased participation, became emotional x2    Balance  Static Sitting Balance Static Sitting - Balance Support: Bilateral upper extremity supported Static Sitting - Level of Assistance: 5: Stand by assistance;4: Min assist Static Sitting - Comment/# of Minutes: fluctuated between min-mingaurdA, unable to tolerate any perturbations without loss of balance; cues for tall posture  End of Session PT - End of Session Equipment Utilized During Treatment: Gait belt Activity Tolerance: Patient limited by fatigue;Other (comment) (flat affect and decreased participation) Patient left: in chair;with call bell/phone within reach Nurse Communication: Mobility status;Need for lift equipment   GP     [facility] HELEN 05/13/2012, 1:55 PM

## 2012-05-13 NOTE — Progress Notes (Addendum)
Passy-Muir Speaking Valve - Treatment Patient Details  Name: Cody [name] MRN: [id number] Date of Birth: Feb 18, 1951  Today's Date: 05/13/2012 Time: 0910-0924 SLP Time Calculation (min): 14 min  Past Medical History:  Past Medical History  Diagnosis Date  . Stroke   . Hypertension   . Hyperlipidemia   . GERD (gastroesophageal reflux disease)   . COPD (chronic obstructive pulmonary disease)    Past Surgical History: History reviewed. No pertinent past surgical history.  Assessment / Plan / Recommendation Clinical Impression  Pt. very lethargic and confused this am and RN note reported copious bloody expectoration from trach.  Oral care provided and PMSV donned with upper airway movement, however decreased efficiency resulting in low vocal intensity, hoarse quality with significantly decreased intelligibility.  Vitals stable HR 115, Sp02 92% and RR within normal range.  Therapeutic dysphagia therapy attempted included ice chip trials with mod verbal and visual cues for effortful swallow, Masako maneuver to facilitate tongue base retraction, and laryngeal elevation exercise with 25% accuracy.  S/S aspiration/decreased airway protection include wet vocal quality and delayed cough.  Pt. Also with poor sustained attention, internally distracted and wanting "to go home."  Contributing factor affecting performance is extreme weakness, decreased functional reserve and poor endurance.  Unfortunately pt. is not appropriate for repeat FEES or MBS at this time and may need more long term alternate means of nutrition.     Plan  Continue with current plan of care    Follow Up Recommendations  Skilled Nursing facility    Pertinent Vitals/Pain No indications    SLP Goals Potential to Achieve Goals: Fair SLP Goal #1: Pt will tolerate PMSV during waking hours, including therapies and throughout mealtimes. SLP Goal #1 - Progress: Progressing toward goal SLP Goal #2: Pt will demonstrate appropriate  volume and breath support to facilitate effective communication. SLP Goal #2 - Progress: Progressing toward goal SLP Goal #3: Pt/family will partipate in education and training re: use, care and cleaning of PMSV, and return demonstration of this education. SLP Goal #3 - Progress: Progressing toward goal   PMSV Trial  PMSV was placed for:  (5 minutes intervals) Able to redirect subglottic air through upper airway: Yes Able to Attain Phonation: Yes Voice Quality: Hoarse;Low vocal intensity Able to Expectorate Secretions: Yes Level of Secretion Expectoration with PMSV: Tracheal Breath Support for Phonation: Mildly decreased Intelligibility: Intelligibility reduced Word: 25-49% accurate Phrase: 0-24% accurate Respirations During Trial:  (WNL) SpO2 During Trial: 92 % Pulse During Trial: 115  Behavior: Confused (lethargic)   Tracheostomy Tube       Vent Dependency  FiO2 (%): 40 %    Cuff Deflation Trial       Tolerated Cuff Deflation:  (cuff deflated upon arrival) Behavior: Confused   Royce Macadamia M.Ed [employer] [phone]  05/13/2012

## 2012-05-13 NOTE — Progress Notes (Signed)
Pt has been oozing blood from trach site and coughing up blood since 2000 per report.Upon entering room this am,pt coughing up blood,inner cannula changed and was full of dried/fresh blood. Trach no longer oozing,but large amount of dried bloody drainage on bandage. O2 sats remain in mid 90's.no distress. Called Dr. Candie Echevaria received to turn off heparin and Dr. Molli Knock to see.

## 2012-05-13 NOTE — Progress Notes (Signed)
Spoke with family, explained case and answered questions, family ok with IR to place PEG tube and will call case management for placement.  Alyson Reedy, M.D. [facility] Pulmonary/Critical Care Medicine. Pager: [phone]. After hours pager: [phone].

## 2012-05-13 NOTE — Progress Notes (Signed)
ANTICOAGULATION CONSULT NOTE - Follow Up Consult  Pharmacy Consult for heparin Indication: DVT/PE  Allergies  Allergen Reactions  . Protamine Anaphylaxis    Patient Measurements: Height: 5\' 10"  (177.8 cm) Weight:  (charted on wrong pt) IBW/kg (Calculated) : 73    Vital Signs: Temp: 98.1 F (36.7 C) (02/05 0802) Temp src: Oral (02/05 0802) BP: 148/81 mmHg (02/05 0802) Pulse Rate: 111  (02/05 0802)  Labs:  Basename 05/13/12 0435 05/12/12 0350 05/11/12 0950 05/11/12 0330  HGB 15.8 15.9 -- --  HCT 45.7 46.9 -- 47.3  PLT 145* 210 -- 230  APTT -- -- -- --  LABPROT -- -- -- --  INR -- -- -- --  HEPARINUNFRC 0.42 0.50 0.58 --  CREATININE 0.51 0.51 -- 0.50  CKTOTAL -- -- -- --  CKMB -- -- -- --  TROPONINI -- -- -- --    Estimated Creatinine Clearance: 97.6 ml/min (by C-G formula based on Cr of 0.51).   Medications:  Scheduled:     . alteplase  2 mg Intracatheter Once  . alteplase  2 mg Intracatheter Once  . [COMPLETED] alteplase  2 mg Intracatheter Once  . amiodarone  400 mg Oral Daily  . antiseptic oral rinse  15 mL Mouth Rinse QID  . chlorhexidine  15 mL Mouth Rinse BID  . clonazePAM  1 mg Per Tube BID  . diltiazem  30 mg Per Tube Q6H  . famotidine (PEPCID) IV  20 mg Intravenous Daily  . haloperidol lactate  5 mg Intravenous Q6H  . insulin aspart  0-15 Units Subcutaneous Q4H  . ipratropium  0.5 mg Nebulization Q6H  . levalbuterol  0.63 mg Nebulization Q6H  . multivitamin  5 mL Per Tube Daily  . [COMPLETED] potassium chloride  40 mEq Per Tube TID  . predniSONE  30 mg Per Tube Daily  . sodium chloride  10-40 mL Intracatheter Q12H  . [COMPLETED] THROMBI-PAD  1 each Topical Once   Infusions:     . sodium chloride 20 mL/hr at 05/12/12 0304  . sodium chloride 10 mL/hr at 05/10/12 2125  . [DISCONTINUED] heparin 2,300 Units/hr (05/12/12 2025)    Assessment: 62 yo male with new PE/DVT (s/p IVC filter on 1/27).  Heparin level this am was 0.42, Hg= 15.9 and  plt= 145 (plt down from 210).  Patient was noted with bloody secretions from trach last pm and coughing  Up blood this am.  Heparin has been discontinued.  Goal of Therapy:  Heparin level 0.3-0.7 units/ml Monitor platelets by anticoagulation protocol: Yes   Plan:  -Will follow patient progress and anticoagulations plans -D/C heparin labs  Harland German, Pharm D 05/13/2012 10:01 AM

## 2012-05-14 ENCOUNTER — Encounter (HOSPITAL_COMMUNITY): Payer: Self-pay | Admitting: Radiology

## 2012-05-14 ENCOUNTER — Inpatient Hospital Stay (HOSPITAL_COMMUNITY): Payer: Medicaid Other

## 2012-05-14 LAB — GLUCOSE, CAPILLARY
Glucose-Capillary: 110 mg/dL — ABNORMAL HIGH (ref 70–99)
Glucose-Capillary: 145 mg/dL — ABNORMAL HIGH (ref 70–99)
Glucose-Capillary: 187 mg/dL — ABNORMAL HIGH (ref 70–99)
Glucose-Capillary: 144 mg/dL — ABNORMAL HIGH (ref 70–99)

## 2012-05-14 LAB — BASIC METABOLIC PANEL
CO2: 25 mEq/L (ref 19–32)
Chloride: 105 mEq/L (ref 96–112)
Sodium: 140 mEq/L (ref 135–145)

## 2012-05-14 LAB — MAGNESIUM: Magnesium: 2 mg/dL (ref 1.5–2.5)

## 2012-05-14 LAB — CBC
MCV: 88.9 fL (ref 78.0–100.0)
Platelets: 141 10*3/uL — ABNORMAL LOW (ref 150–400)
RBC: 5.33 MIL/uL (ref 4.22–5.81)
WBC: 24.3 10*3/uL — ABNORMAL HIGH (ref 4.0–10.5)

## 2012-05-14 LAB — PHOSPHORUS: Phosphorus: 3 mg/dL (ref 2.3–4.6)

## 2012-05-14 MED ORDER — JEVITY 1.2 CAL PO LIQD
1000.0000 mL | ORAL | Status: DC
  Administered 2012-05-14 – 2012-05-17 (×4): 1000 mL
  Filled 2012-05-14 (×12): qty 1000

## 2012-05-14 MED ORDER — CEFAZOLIN SODIUM-DEXTROSE 2-3 GM-% IV SOLR
2.0000 g | Freq: Once | INTRAVENOUS | Status: AC
  Administered 2012-05-15: 2 g via INTRAVENOUS
  Filled 2012-05-14: qty 50

## 2012-05-14 MED ORDER — DEXTROSE 5 % IV SOLN: 1.0000 g | INTRAVENOUS | Status: DC

## 2012-05-14 MED ORDER — DEXTROSE 5 % IV SOLN
1.0000 g | Freq: Three times a day (TID) | INTRAVENOUS | Status: DC
  Administered 2012-05-14 – 2012-05-23 (×26): 1 g via INTRAVENOUS
  Filled 2012-05-14 (×29): qty 1

## 2012-05-14 MED ORDER — PRO-STAT SUGAR FREE PO LIQD
60.0000 mL | Freq: Three times a day (TID) | ORAL | Status: DC
  Administered 2012-05-14 – 2012-05-18 (×12): 60 mL
  Filled 2012-05-14 (×14): qty 60

## 2012-05-14 NOTE — Clinical Social Work Note (Signed)
Clinical Social Worker spoke with significant other, Fernande Bras and informed her that updated clinicals were sent to the four SNF's ([facility], [facility], [facility] and [address] and [address]) that are considering the patient. Patient will need to be 28% trach collar before they can make a bed offer. CSW will continue to follow regarding CIR vs SNF placement at discharge.   Rozetta Nunnery MSW, [employer] [phone]

## 2012-05-14 NOTE — Progress Notes (Signed)
NUTRITION FOLLOW UP  Intervention:   1. Recommend initiation of EN via NG tube until able to place PEG. Jevity 1.2 @ 60 ml/hr and 30 ml Pro-stat TID. This will provide 2028 kcal, 125 gm protein, and 1162 ml free water.  2. Once PEG has been placed and cleared for use, recommend transition to bolus feeding schedule to allow for more time for rehab.   2. RD will continue to follow    Nutrition Dx:   Inadequate oral intake, ongoing.   Goal:   Intake to meet >/=90% estimated nutrition needs. Unmet at this time.   Monitor:   Vent status & settings, PEG placement, weight trends, I/O's, swallow evaluation  Assessment:  Ileus resolved at this time, planned for PEG today, but did not get barium so now is planned for tomorrow. Pt with NG tube in place, TF was held all night for PEG today. RN to call MD and get restart orders.   Per RN, pt with TF on hold all night for placement, on review of orders unable to find any orders for TF since Vital 1.5- d/c'd on 1/27. Review of flow-sheets show  NG tube with rate of 60 ml/hr starting @ 2000 on 2/5.    Height: Ht Readings from Last 1 Encounters:  09/19/10 5' 10.5" (1.791 m)    Weight Status:   Wt Readings from Last 1 Encounters:  05/12/12 190 lb 4.1 oz (86.3 kg)   Weight trending down, admission weight of 231 lbs   Re-estimated needs:  Kcal: 2000-2200 kcal  Protein: 125-135 gm Fluid: >/= 1.2 L daily   Skin: stage 1 pressure ulcer buttocks, moisture associated wound to inner thigh   Diet Order: NPO   Intake/Output Summary (Last 24 hours) at 05/14/12 1041 Last data filed at 05/14/12 0900  Gross per 24 hour  Intake    680 ml  Output    800 ml  Net   -120 ml    Last BM: 2/6   Labs:   Lab 05/14/12 0432 05/13/12 0435 05/12/12 0350  NA 140 138 137  K 4.1 3.6 3.5  CL 105 100 99  CO2 25 25 27   BUN 19 13 13   CREATININE 0.56 0.51 0.51  CALCIUM 9.7 9.7 9.1  MG 2.0 1.8 1.8  PHOS 3.0 3.2 3.2  GLUCOSE 119* 118* 85    CBG  (last 3)   Basename 05/14/12 0742 05/14/12 0343 05/13/12 2355  GLUCAP 110* 106* 120*    Scheduled Meds:    . alteplase  2 mg Intracatheter Once  . alteplase  2 mg Intracatheter Once  . amiodarone  400 mg Oral Daily  . antiseptic oral rinse  15 mL Mouth Rinse QID  . chlorhexidine  15 mL Mouth Rinse BID  . clonazePAM  1 mg Per Tube BID  . diltiazem  30 mg Per Tube Q6H  . famotidine (PEPCID) IV  20 mg Intravenous Daily  . haloperidol lactate  5 mg Intravenous Q6H  . insulin aspart  0-15 Units Subcutaneous Q4H  . ipratropium  0.5 mg Nebulization Q6H  . levalbuterol  0.63 mg Nebulization Q6H  . multivitamin  5 mL Per Tube Daily  . predniSONE  30 mg Per Tube Daily  . sodium chloride  10-40 mL Intracatheter Q12H    Continuous Infusions:    . sodium chloride 20 mL/hr at 05/12/12 0304  . sodium chloride 10 mL/hr at 05/10/12 2125    Clarene Duke RD, LDN Pager [phone] After Hours pager #  [phone]      

## 2012-05-14 NOTE — Progress Notes (Signed)
ANTIBIOTIC CONSULT NOTE - INITIAL  Pharmacy Consult for Ceftazidime Indication: E.coli UTI  Allergies  Allergen Reactions  . Protamine Anaphylaxis    Patient Measurements: Height: 5\' 10"  (177.8 cm) Weight:  (charted on wrong pt) IBW/kg (Calculated) : 73   Vital Signs: Temp: 97.4 F (36.3 C) (02/06 1600) Temp src: Oral (02/06 1600) BP: 140/90 mmHg (02/06 1600) Pulse Rate: 101  (02/06 1600) Intake/Output from previous day: 02/05 0701 - 02/06 0700 In: 930 [I.V.:460; NG/GT:420; IV Piggyback:50] Out: 800 [Urine:800] Intake/Output from this shift: Total I/O In: 60 [I.V.:60] Out: -   Labs:  Basename 05/14/12 0432 05/13/12 0435 05/12/12 0350  WBC 24.3* 30.0* 23.8*  HGB 16.3 15.8 15.9  PLT 141* 145* 210  LABCREA -- -- --  CREATININE 0.56 0.51 0.51   Estimated Creatinine Clearance: 97.6 ml/min (by C-G formula based on Cr of 0.56).    Microbiology: Recent Results (from the past 720 hour(s))  CULTURE, BLOOD (ROUTINE X 2)     Status: Normal   Collection Time   04/20/12 12:50 AM      Component Value Range Status Comment   Specimen Description BLOOD LEFT ARM   Final    Special Requests BOTTLES DRAWN AEROBIC ONLY 10CC   Final    Culture  Setup Time 04/20/2012 08:43   Final    Culture NO GROWTH 5 DAYS   Final    Report Status 04/26/2012 FINAL   Final   CULTURE, BLOOD (ROUTINE X 2)     Status: Normal   Collection Time   04/20/12  1:10 AM      Component Value Range Status Comment   Specimen Description BLOOD LEFT ARM   Final    Special Requests BOTTLES DRAWN AEROBIC ONLY 10CC   Final    Culture  Setup Time 04/20/2012 08:43   Final    Culture NO GROWTH 5 DAYS   Final    Report Status 04/26/2012 FINAL   Final   CATH TIP CULTURE     Status: Normal   Collection Time   04/20/12  5:06 PM      Component Value Range Status Comment   Specimen Description CATH TIP CENTRAL LINE   Final    Special Requests NONE   Final    Culture     Final    Value: 70 COLONIES STAPHYLOCOCCUS  SPECIES (COAGULASE NEGATIVE)     Note: RIFAMPIN AND GENTAMICIN SHOULD NOT BE USED AS SINGLE DRUGS FOR TREATMENT OF STAPH INFECTIONS.   Report Status 04/24/2012 FINAL   Final    Organism ID, Bacteria STAPHYLOCOCCUS SPECIES (COAGULASE NEGATIVE)   Final   CLOSTRIDIUM DIFFICILE BY PCR     Status: Normal   Collection Time   04/21/12  9:01 PM      Component Value Range Status Comment   C difficile by pcr NEGATIVE  NEGATIVE Final   CULTURE, RESPIRATORY     Status: Normal   Collection Time   04/25/12  5:54 PM      Component Value Range Status Comment   Specimen Description TRACHEAL ASPIRATE   Final    Special Requests NONE   Final    Gram Stain     Final    Value: FEW WBC PRESENT,BOTH PMN AND MONONUCLEAR     NO SQUAMOUS EPITHELIAL CELLS SEEN     NO ORGANISMS SEEN   Culture Non-Pathogenic Oropharyngeal-type Flora Isolated.   Final    Report Status 04/28/2012 FINAL   Final   CATH TIP CULTURE  Status: Normal   Collection Time   05/05/12  9:42 PM      Component Value Range Status Comment   Specimen Description CATH TIP   Final    Special Requests RIGHT IJ   Final    Culture     Final    Value: 50 COLONIES STAPHYLOCOCCUS SPECIES (COAGULASE NEGATIVE)     Note: RIFAMPIN AND GENTAMICIN SHOULD NOT BE USED AS SINGLE DRUGS FOR TREATMENT OF STAPH INFECTIONS. This organism DOES NOT demonstrate inducible Clindamycin resistance in vitro.   Report Status 05/09/2012 FINAL   Final    Organism ID, Bacteria STAPHYLOCOCCUS SPECIES (COAGULASE NEGATIVE)   Final   URINE CULTURE     Status: Normal (Preliminary result)   Collection Time   05/13/12  2:31 PM      Component Value Range Status Comment   Specimen Description URINE, CATHETERIZED   Final    Special Requests NONE   Final    Culture  Setup Time 05/13/2012 14:53   Final    Colony Count >=100,000 COLONIES/ML   Final    Culture ESCHERICHIA COLI   Final    Report Status PENDING   Incomplete   CULTURE, RESPIRATORY (NON-EXPECTORATED)     Status: Normal  (Preliminary result)   Collection Time   05/13/12  2:31 PM      Component Value Range Status Comment   Specimen Description TRACHEAL ASPIRATE   Final    Special Requests NONE   Final    Gram Stain     Final    Value: MODERATE WBC PRESENT,BOTH PMN AND MONONUCLEAR     MODERATE SQUAMOUS EPITHELIAL CELLS PRESENT     MODERATE GRAM POSITIVE RODS     RARE GRAM POSITIVE COCCI IN CHAINS     IN PAIRS   Culture Culture reincubated for better growth   Final    Report Status PENDING   Incomplete   CULTURE, BLOOD (ROUTINE X 2)     Status: Normal (Preliminary result)   Collection Time   05/13/12  3:00 PM      Component Value Range Status Comment   Specimen Description BLOOD FOREARM LEFT   Final    Special Requests BOTTLES DRAWN AEROBIC ONLY 10CC   Final    Culture  Setup Time 05/13/2012 23:34   Final    Culture     Final    Value:        BLOOD CULTURE RECEIVED NO GROWTH TO DATE CULTURE WILL BE HELD FOR 5 DAYS BEFORE ISSUING A FINAL NEGATIVE REPORT   Report Status PENDING   Incomplete   CULTURE, BLOOD (ROUTINE X 2)     Status: Normal (Preliminary result)   Collection Time   05/13/12  3:07 PM      Component Value Range Status Comment   Specimen Description BLOOD HAND LEFT   Final    Special Requests BOTTLES DRAWN AEROBIC ONLY [facility]   Final    Culture  Setup Time 05/13/2012 23:33   Final    Culture     Final    Value:        BLOOD CULTURE RECEIVED NO GROWTH TO DATE CULTURE WILL BE HELD FOR 5 DAYS BEFORE ISSUING A FINAL NEGATIVE REPORT   Report Status PENDING   Incomplete     Medical History: Past Medical History  Diagnosis Date  . Stroke   . Hypertension   . Hyperlipidemia   . GERD (gastroesophageal reflux disease)   . COPD (chronic obstructive pulmonary  disease)     Medications:  Scheduled:    . alteplase  2 mg Intracatheter Once  . alteplase  2 mg Intracatheter Once  . amiodarone  400 mg Oral Daily  . antiseptic oral rinse  15 mL Mouth Rinse QID  .  ceFAZolin (ANCEF) IV  2 g Intravenous  Once  . chlorhexidine  15 mL Mouth Rinse BID  . clonazePAM  1 mg Per Tube BID  . diltiazem  30 mg Per Tube Q6H  . famotidine (PEPCID) IV  20 mg Intravenous Daily  . feeding supplement  60 mL Per Tube TID  . haloperidol lactate  5 mg Intravenous Q6H  . insulin aspart  0-15 Units Subcutaneous Q4H  . ipratropium  0.5 mg Nebulization Q6H  . levalbuterol  0.63 mg Nebulization Q6H  . multivitamin  5 mL Per Tube Daily  . [COMPLETED] potassium chloride  40 mEq Per Tube TID  . [EXPIRED] potassium chloride      . predniSONE  30 mg Per Tube Daily  . sodium chloride  10-40 mL Intracatheter Q12H  . [DISCONTINUED] cefTRIAXone (ROCEPHIN)  IV  1 g Intravenous Q24H   Assessment: 62 yo M known to pharmacy from previous abx and anticoag dosing consults.  Pt has developed an E. Coli UTI and is to start Ceftazidime therapy.  Renal function is good with CrCl ~ 90.  WBC remain elevated but trending down.  Goal of Therapy:  Eradication of infection  Plan:  Ceftazidime 1 gm IV q8h. Can discontinue pre-op Ancef ordered for 2/7 prior to PEG placement since patient will be covered with Ceftazidime. Will follow culture data, clinical progress, and renal function.  [employer], Pharm.D., BCPS Clinical Pharmacist Pager [phone] 05/14/2012 5:01 PM

## 2012-05-14 NOTE — Progress Notes (Signed)
Brief Nutrition Note:   RD received call from RN that pt to resume TF per MD orders. RD will place TF orders per rec's from previous RD note, please see previous note for full details.   Clarene Duke RD, LDN Pager [phone] After Hours pager [phone]

## 2012-05-14 NOTE — Progress Notes (Signed)
PULMONARY  / CRITICAL CARE MEDICINE  Name: Cody [name] MRN: [id number] DOB: 1950/08/13    LOS: 32  REFERRING MD:   EDP - Dr. Preston Fleeting  CHIEF COMPLAINT:  Shortness of breath  BRIEF PATIENT DESCRIPTION: 4 yoM with COPD (3ppd smoking) admitted 1/5 with VDRF 2/2 influenza A and asp PNA, intubated 04/13/11 - 1/15 for AW protection, aspirated during intubation. Reintubated emergently 1/18 for severe delerium & resp distress, purulent secretions noted. Trach 1/22. New PE, DVT BL, s/p IVF filter 1/27. On hep gtt.   LINES / TUBES: ETT 1/5 >>1/15 , 1/18 >> self extubated 1/21 >>> reintubated 1/21 L IJ 1/5 >> 1/13 R IJ 1/13 >> 1/16 RIJ 1/18 >> 1/28 R A-line 1/5 >>1/8 pulled out by pt Trach 1/22 IVC Filter 1/27 >> PICC 1/28 >>  CULTURES: Blood culture 1/5>> Negative Resp culture 1/6>> H. influenzae  Resp viral panel 1/6>> Influenza A and Metapneumovirus  Sputum Leigonella Culture 1/6>> Negative Bronch Sputum 1/6>> H. influenzae  Flu 1/5>> Negative Blood culture 01/13 >> Negative Catheter tip culture 1/13 >>Staph -coag neg-70 colonies(vanc sens)  C.diff PCR 1/14 >> Negative Resp culture 1/18 >>> Negative  ANTIBIOTICS: Zosyn 1/5>>1/9 - restarted 1/22 >>1/28 Azithro 1/5>>1/8  Vanc 1/6>>>1/8 - restarted 1/13 >>1/25 Tamiflu 1/6>>>1/16  Rocephin 1/9>>1/15 Ceftazidine 1/18 >>1/22  SIGNIFICANT EVENTS:  1/5 Intubated in ED  1/5 Shock , levophed  1/6 ECHO - 55%, mod dil rv, PA pressures>>> not measured b/c no TR jet  1/6 Bronched- diffuse pus all lobes  1/7 increasing pCO2, increased RR on vent  1/8 Changed sedation to Precedex, more agitated than with versed  1/9 Changed sedation back to Versed, agitation greatly improved  1/10 scheduled haldol  1/12 Restarting precedex gtt due to persistent agitation 1/13 Febrile to 102.6 overnight. ? Catheter infection? DC'ed L IJ, placed new R IJ. 1/14 No fevers overnight, agitation controlled on propofol (added 1/13), more interactive. 1/15 Had  foul smelling stools overnight, therefore, C.diff PCR was sent by RN. 1/15 EXTUBATED, developed A.fib with RVR overnight. 1/16 Persistent episodes of desaturation., afib>started on cardizem drip  1/17 increased agitation  1/18 reintubated  1/20 Persistent A. Fib this morning on dilt drip.  Rates controlled between 99-125 1/21 Self extubated, given a chance and continued to be in respiratory distress so reintubated.  1/22 Tracheostomy, complicated by bleeding, angio edema from protamine 1/23 Continued agitation. 1/23 Neg acute ct head 1/24 PEG placement planned --> delayed bc of ileus 1/25 CTA showing PE BL --> started on heparin gtt. 1/26 BL LE DVT with mobile thrombus 1/27 IVC Filter placement. 1/28 PICC Line 1/29 Persistent ileus per abdominal x-ray. Attempting to limit narcotics. And 1/30 Tolerated PMSV placement for 15 minutes and fatigue and   LEVEL OF CARE:  ICU PRIMARY SERVICE:  PCCM CONSULTANTS:  None CODE STATUS: Full DIET:  TF on hold as repeatedly pulling tubes out. DVT Px:  Heparin gtt GI Px:  Protonix  SUBJECTIVE / INTERVAL HISTORY:  Remains confused, bleeding overnight from trach site.  Interval Events: - Doing TC 24x7 - difficulty with PMV - failed FEES 1/31 - dilt gtt at 10 - request made for NGT per IR 2/1 pm  VITAL SIGNS: Temp:  [97.3 F (36.3 C)-98.1 F (36.7 C)] 98 F (36.7 C) (02/06 0745) Pulse Rate:  [93-115] 97  (02/06 0745) Resp:  [18-22] 22  (02/06 0321) BP: (132-139)/(79-96) 139/96 mmHg (02/06 0745) SpO2:  [89 %-97 %] 93 % (02/06 0753) FiO2 (%):  [40 %] 40 % (  02/06 0753) HEMODYNAMICS: VENTILATOR SETTINGS: Vent Mode:  [-]  FiO2 (%):  [40 %] 40 % INTAKE / OUTPUT:  Intake/Output Summary (Last 24 hours) at 05/14/12 1040 Last data filed at 05/14/12 0900  Gross per 24 hour  Intake    930 ml  Output    800 ml  Net    130 ml   PHYSICAL EXAMINATION: General: Tracheostomy in place, on ventilator, opens eyes to command and tracks around the  room but not following any command.  Head: Normocephalic, Trach in place, no bleeding.  Lungs:  Diminished BS, coarse breath sounds clearing.Marland Kitchen  Heart: Irregularly irregular rhythm, regular rate.  Abdomen:  BS normoactive. Soft, Nondistended, non-tender.  No masses or organomegaly.  Extremities: No pretibial edema.    IMAGING:  1/30 - NG tube coiled overlying the proximal stomach.gaseous distention again noted.   1/25 - CTA chest - 1. Pulmonary embolus within segmental branches to the right upper lobe, left upper lobe and left lower lobe, extending distally. 2. Bilateral lower lobe airspace opacification is thought to reflect atelectasis, with trace bilateral pleural effusions. No definite evidence for aspiration. 3. Mild nonspecific left upper lobe opacities may reflect atelectasis.  1/26 - LE Dopplers - 1. Acute DVT right saphenofemoral junction, common femoral vein. 2. Acute DVT left saphenofemoral junction, left femoral vein, and mobile thrombus in the left common femoral vein.   DIAGNOSES: Principal Problem:  *Acute respiratory failure with hypercapnia Active Problems:  HYPERLIPIDEMIA  TOBACCO USE  HYPERTENSION  GERD  COPD (chronic obstructive pulmonary disease)  Acute encephalopathy  Acute kidney injury  Atrial fibrillation  PE (pulmonary embolism)  Tracheostomy status  ASSESSMENT / PLAN:  PULMONARY A: 1) Acute hypercapnic respiratory failure/ARDS - secondary to flu, COPD exacerbation. Trach 1/22. 2) Aspiration - at time of intubation, finished course of treatment  3) Pneumonitis vs. CAP - b/l interstitial opacities on cxr, slightly increased at left, likely atelectasis. Completed 8/8 days rocephin, on tapering steroids  4) Pulmonary edema - improved. 4) Non-oxygen dependent COPD - h/o chronic tobacco abuse hx 3 pack per day 20+ years.  5) Bloody secretions from aspiration - Pulm /renal syndrome ruled out. 6) Upper airway severe angioedema & rash from protamine  7)  Likley small hematoma on rt apex from neck oozing  P:  - ATC as tolerated, 24x7 - Duonebs q6h + albuterol q2h PRN. - Continue steroids for rash - D/C solumedrol and use prednisone 30 mg PO daily. - Continue attempts PMSV, following command is the primary issue here that is obstructing that goal. - Hold heparin due to hemoptysis and bleeding from trach, improved but now will hold for PEG placement and place SCD's for DVT prophylaxis.  CARDIOVASCULAR  A:  1) New onset Afib with RVR - Unable to take oral amiodarone. On cardizem, amio. CE neg x 3. On heparin gtt. ? Back to NSR on 2/1 2) Hypotension - mild. With hx of HTN. 3) Anaphylaxis to protamine - protamine given in setting of trach placement, resultant anaphylactic reaction - resolved.  P:  - Goal HR < 120, cardizem gtt, currently at 10.-change to PO when able - Resume amio PO when able, may need amio IV if sustains AF - Continue PO dilt, 30 q6 hours PO today since NGT is in. - Anti-coagulation held for PE, DVT and a-fib due to trach bleeding, hemoptysis and PEG placement.  RENAL  Lab 05/14/12 0432 05/13/12 0435  NA 140 138  K 4.1 3.6  CL 105 100  CO2  25 25  BUN 19 13  CREATININE 0.56 0.51  GLUCOSE 119* 118*  MG 2.0 1.8  PHOS 3.0 3.2   A:  1) Acute Kidney injury - Resolved. Previously likely pre-renal in setting of volume depletion. Previously urinary sediment clogging foley, improved. Continue to monitor in setting of lasix use. 2) Hypernatremia - resolved  3) Hypokalemia - Previously in setting of lasix no in setting of NPO.  P:  - Repleting IV KCl as needed. - Goal to keep even.  GASTROINTESTINAL No results found for this basename: AST:3,ALT:3,ALKPHOS:3,BILITOT:3,PROT:3,ALBUMIN:3 in the last 168 hours A:  1) GERD 2) Diarrhea - resolved. C. Diff negative. 3) Colonic Ileus > resolved 4) Paraesophageal nodes - need FU CT in 3-4 mnths P: - Continue TF, Pepcid. - Minimize narcotics. - Failed Swallow eval again  1/31, NGT ordered but deferred 2/2 since he has pulled 2 GT already. Family confirmed desire for PEG placement, IR called 2/5 and will evaluate today, WBC elevation is due to stress and steroids, not evidence of infection.  HEMATOLOGIC  Lab 05/14/12 0432 05/13/12 0435  WBC 24.3* 30.0*  HGB 16.3 15.8  HCT 47.4 45.7  MCV 88.9 87.9  PLT 141* 145*   A:  1) Leukocytosis -  no fever. 3) Anticoagulation in setting of atrial fibrillation - New diagnosis. CHADS 3. Coumadin on home. On heparin gtt. 4) Bilateral PE 5) Bilateral DVT - IVC filter placement 1/27 P:  - SCD for DVT prophlaxis. - No coumadin for now until PEG is in place.  INFECTIOUS  Lab 05/14/12 0432 05/13/12 0435  WBC 24.3* 30.0*  NEUTROABS -- --   A: 1) Flu pneumonia - Flu A and metapneumovirus > finished Tamiflu x 10d, droplet isolation d/c 1/18 2) Aspirated at time of intubation, pneumonitis vs PNA> finished Rocephin x8d. Aspirated during reintubation.  3) Nosocomial infection - Left IJ (in 8d) dc'ed 1/13 and cath tip cultures>staph/coag neg > tx finished Vanc  4) Diarrhea - resolved. 5) Leukocytosis - likely 2/2 steroids. Ceftaz 1/18 --> 1/29 w/ sputum cx negative. Procalcitonin 0.14 --> 0.15. WBC rising with low grade fever. P: Recultured 2/5 and if shows signs of occult infections then will start broad spectrum abx.  The WBC is improving with no fever, likely a combination of steroid and stress.  ENDOCRINE  Lab 05/14/12 0742 05/14/12 0343 05/13/12 2355 05/13/12 1939 05/13/12 1657  GLUCAP 110* 106* 120* 124* 149*   A:  1) Diabetes mellitus, type 2 (A1c 7.1) - possible iatrogenic secondary to prolonged steroid use. 2) On steroids - CBGs at goal. Lantus off. P: ICU Hyperglycemic protocol  NEUROLOGIC / PSYCHIATRIC   A:  1) Delerium - persistent, controlled on scheduled ativan, haldol and fentanyl, prn versed  P:  - Again, goal to stay off sedating drips - Continue scheduled haldol and decrease PRN fentanyl for  now given persistent ileus. - Swallow study - when delirium is better under control. - Monitor QTc - haldol 5 q6 standing - Does not qualify for LTAC, SNF/trach beds [place] (possibly out of state) awaiting input from case management. - PT/ OT consult. - IV Ativan. - CIR eval - good candidate once acute issues resolved primarily delirium.  GLOBAL:  PEG placement by IR then likely SNF placement after that.Marland Kitchen   CLINICAL SUMMARY: 69 yoM with COPD (3ppd smoking) admitted 1/5 with VDRF 2/2 influenza A and asp PNA, intubated 04/13/11 - 1/15 for AW protection, aspirated during intubation. Reintubated emergently 1/18 for severe delerium & resp distress, purulent  secretions noted. Trach 1/22. New PE, DVT BL, s/p IVF filter 1/27. Off heparin drip due to trach bleeding.  Awaiting PEG then placement.  Alyson Reedy, M.D. [facility] Pulmonary/Critical Care Medicine. Pager: [phone]. After hours pager: [phone].

## 2012-05-14 NOTE — Progress Notes (Signed)
eLink Physician-Brief Progress Note Patient Name: Cody [name] DOB: 1950-12-04 MRN: [id number]  Date of Service  05/14/2012   HPI/Events of Note   uti  eICU Interventions  Ceftaz, condom on   Intervention Category Major Interventions: Sepsis - evaluation and management  [name]. 05/14/2012, 4:34 PM

## 2012-05-14 NOTE — Progress Notes (Signed)
Received call from patient's partner, Karin Lieu, requesting info on plans for patient. Explained that pt is not medically stable for CIR at this time and that we continue to follow pt. Explained that pt's CSW is working on SNF as a back-up. Encouraged her to talk with pt's CSW re: SNF requests. Please call for questions: [phone]

## 2012-05-14 NOTE — Progress Notes (Signed)
Patient ID: Cody [name], male   DOB: 08/02/1950, 62 y.o.   MRN: [id number]   See H/P note of 04/30/12 Prior to originally scheduled gastric tube placement Pre sedation eval  of 05/14/12  Orders in chart

## 2012-05-15 ENCOUNTER — Inpatient Hospital Stay (HOSPITAL_COMMUNITY): Payer: Medicaid Other

## 2012-05-15 LAB — CBC
HCT: 46.1 % (ref 39.0–52.0)
MCHC: 33.4 g/dL (ref 30.0–36.0)
Platelets: 134 10*3/uL — ABNORMAL LOW (ref 150–400)
RDW: 16.9 % — ABNORMAL HIGH (ref 11.5–15.5)

## 2012-05-15 LAB — URINE CULTURE

## 2012-05-15 LAB — BASIC METABOLIC PANEL
BUN: 24 mg/dL — ABNORMAL HIGH (ref 6–23)
GFR calc Af Amer: >90 mL/min (ref >90)
GFR calc non Af Amer: >90 mL/min (ref >90)
Potassium: 3.1 mEq/L — ABNORMAL LOW (ref 3.5–5.1)
Sodium: 145 mEq/L (ref 135–145)

## 2012-05-15 LAB — CULTURE, RESPIRATORY W GRAM STAIN

## 2012-05-15 LAB — GLUCOSE, CAPILLARY
Glucose-Capillary: 96 mg/dL (ref 70–99)
Glucose-Capillary: 159 mg/dL — ABNORMAL HIGH (ref 70–99)

## 2012-05-15 LAB — MAGNESIUM: Magnesium: 1.8 mg/dL (ref 1.5–2.5)

## 2012-05-15 MED ORDER — MIDAZOLAM HCL 2 MG/2ML IJ SOLN
INTRAMUSCULAR | Status: AC | PRN
  Administered 2012-05-15: 2 mg via INTRAVENOUS

## 2012-05-15 MED ORDER — POTASSIUM CHLORIDE 20 MEQ/15ML (10%) PO LIQD
40.0000 meq | Freq: Once | ORAL | Status: AC
  Administered 2012-05-15: 40 meq
  Filled 2012-05-15: qty 30

## 2012-05-15 MED ORDER — FENTANYL CITRATE 0.05 MG/ML IJ SOLN
INTRAMUSCULAR | Status: AC | PRN
  Administered 2012-05-15: 50 ug via INTRAVENOUS

## 2012-05-15 MED ORDER — PREDNISONE 5 MG/ML PO CONC
20.0000 mg | Freq: Every day | ORAL | Status: DC
  Administered 2012-05-16 – 2012-05-22 (×5): 20 mg
  Filled 2012-05-15 (×8): qty 4

## 2012-05-15 MED ORDER — IOHEXOL 300 MG/ML  SOLN
100.0000 mL | Freq: Once | INTRAMUSCULAR | Status: AC | PRN
  Administered 2012-05-15: 1 mL

## 2012-05-15 MED ORDER — GLUCAGON HCL (RDNA) 1 MG IJ SOLR
1.0000 mg | Freq: Once | INTRAMUSCULAR | Status: AC
  Administered 2012-05-15: 1 mg via INTRAVENOUS
  Filled 2012-05-15: qty 1

## 2012-05-15 NOTE — Progress Notes (Signed)
PULMONARY  / CRITICAL CARE MEDICINE  Name: Cody [name] MRN: [id number] DOB: August 02, 1950    LOS: 33  REFERRING MD:   EDP - Dr. Preston Fleeting  CHIEF COMPLAINT:  Shortness of breath  BRIEF PATIENT DESCRIPTION: 83 yoM with COPD (3ppd smoking) admitted 1/5 with VDRF 2/2 influenza A and asp PNA, intubated 04/13/11 - 1/15 for AW protection, aspirated during intubation. Reintubated emergently 1/18 for severe delerium & resp distress, purulent secretions noted. Trach 1/22. New PE, DVT BL, s/p IVF filter 1/27. On hep gtt.   LINES / TUBES: ETT 1/5 >>1/15 , 1/18 >> self extubated 1/21 >>> reintubated 1/21 L IJ 1/5 >> 1/13 R IJ 1/13 >> 1/16 RIJ 1/18 >> 1/28 R A-line 1/5 >>1/8 pulled out by pt Trach 1/22 IVC Filter 1/27 >> PICC 1/28 >>  CULTURES: Blood culture 1/5>> Negative Resp culture 1/6>> H. influenzae  Resp viral panel 1/6>> Influenza A and Metapneumovirus  Sputum Leigonella Culture 1/6>> Negative Bronch Sputum 1/6>> H. influenzae  Flu 1/5>> Negative Blood culture 01/13 >> Negative Catheter tip culture 1/13 >>Staph -coag neg-70 colonies(vanc sens)  C.diff PCR 1/14 >> Negative Resp culture 1/18 >>> Negative Blood culture 2/6>>>NTD Urine 2/6>>>E. Coli sensitivities pending.  ANTIBIOTICS: Zosyn 1/5>>1/9 - restarted 1/22 >>1/28 Azithro 1/5>>1/8  Vanc 1/6>>>1/8 - restarted 1/13 >>1/25 Tamiflu 1/6>>>1/16  Rocephin 1/9>>1/15 Ceftazidine 1/18 >>1/22>>>2/6>>>  SIGNIFICANT EVENTS:  1/5 Intubated in ED  1/5 Shock , levophed  1/6 ECHO - 55%, mod dil rv, PA pressures>>> not measured b/c no TR jet  1/6 Bronched- diffuse pus all lobes  1/7 increasing pCO2, increased RR on vent  1/8 Changed sedation to Precedex, more agitated than with versed  1/9 Changed sedation back to Versed, agitation greatly improved  1/10 scheduled haldol  1/12 Restarting precedex gtt due to persistent agitation 1/13 Febrile to 102.6 overnight. ? Catheter infection? DC'ed L IJ, placed new R IJ. 1/14 No fevers  overnight, agitation controlled on propofol (added 1/13), more interactive. 1/15 Had foul smelling stools overnight, therefore, C.diff PCR was sent by RN. 1/15 EXTUBATED, developed A.fib with RVR overnight. 1/16 Persistent episodes of desaturation., afib>started on cardizem drip  1/17 increased agitation  1/18 reintubated  1/20 Persistent A. Fib this morning on dilt drip.  Rates controlled between 99-125 1/21 Self extubated, given a chance and continued to be in respiratory distress so reintubated.  1/22 Tracheostomy, complicated by bleeding, angio edema from protamine 1/23 Continued agitation. 1/23 Neg acute ct head 1/24 PEG placement planned --> delayed bc of ileus 1/25 CTA showing PE BL --> started on heparin gtt. 1/26 BL LE DVT with mobile thrombus 1/27 IVC Filter placement. 1/28 PICC Line 1/29 Persistent ileus per abdominal x-ray. Attempting to limit narcotics. And 1/30 Tolerated PMSV placement for 15 minutes and fatigue and   LEVEL OF CARE:  ICU PRIMARY SERVICE:  PCCM CONSULTANTS:  None CODE STATUS: Full DIET:  TF on hold as repeatedly pulling tubes out. DVT Px:  Heparin gtt GI Px:  Protonix  SUBJECTIVE / INTERVAL HISTORY:  Remains confused, bleeding overnight from trach site.  Interval Events: - Doing TC 24x7 - difficulty with PMV - failed FEES 1/31 - dilt gtt at 10 - request made for NGT per IR 2/1 pm  VITAL SIGNS: Temp:  [97.4 F (36.3 C)-98.5 F (36.9 C)] 98.5 F (36.9 C) (02/07 0759) Pulse Rate:  [48-111] 108  (02/07 0903) Resp:  [17-20] 20  (02/06 1600) BP: (133-186)/(84-112) 151/112 mmHg (02/07 0759) SpO2:  [87 %-97 %] 93 % (  02/07 0903) FiO2 (%):  [35 %-40 %] 35 % (02/07 0903) Weight:  [83.5 kg (184 lb 1.4 oz)] 83.5 kg (184 lb 1.4 oz) (02/07 0430) HEMODYNAMICS: VENTILATOR SETTINGS: Vent Mode:  [-]  FiO2 (%):  [35 %-40 %] 35 % INTAKE / OUTPUT:  Intake/Output Summary (Last 24 hours) at 05/15/12 1131 Last data filed at 05/15/12 0700  Gross per 24 hour   Intake   1515 ml  Output    775 ml  Net    740 ml   PHYSICAL EXAMINATION: General: Tracheostomy in place, on ventilator, opens eyes to command and tracks around the room but not following any command.  Head: Normocephalic, Trach in place, no bleeding.  Lungs:  Diminished BS, coarse breath sounds clearing.Marland Kitchen  Heart: Irregularly irregular rhythm, regular rate.  Abdomen:  BS normoactive. Soft, Nondistended, non-tender.  No masses or organomegaly.  Extremities: No pretibial edema.    IMAGING:  1/30 - NG tube coiled overlying the proximal stomach.gaseous distention again noted.   1/25 - CTA chest - 1. Pulmonary embolus within segmental branches to the right upper lobe, left upper lobe and left lower lobe, extending distally. 2. Bilateral lower lobe airspace opacification is thought to reflect atelectasis, with trace bilateral pleural effusions. No definite evidence for aspiration. 3. Mild nonspecific left upper lobe opacities may reflect atelectasis.  1/26 - LE Dopplers - 1. Acute DVT right saphenofemoral junction, common femoral vein. 2. Acute DVT left saphenofemoral junction, left femoral vein, and mobile thrombus in the left common femoral vein.   DIAGNOSES: Principal Problem:  *Acute respiratory failure with hypercapnia Active Problems:  HYPERLIPIDEMIA  TOBACCO USE  HYPERTENSION  GERD  COPD (chronic obstructive pulmonary disease)  Acute encephalopathy  Acute kidney injury  Atrial fibrillation  PE (pulmonary embolism)  Tracheostomy status  ASSESSMENT / PLAN:  PULMONARY A: 1) Acute hypercapnic respiratory failure/ARDS - secondary to flu, COPD exacerbation. Trach 1/22. 2) Aspiration - at time of intubation, finished course of treatment  3) Pneumonitis vs. CAP - b/l interstitial opacities on cxr, slightly increased at left, likely atelectasis. Completed 8/8 days rocephin, on tapering steroids  4) Pulmonary edema - improved. 4) Non-oxygen dependent COPD - h/o chronic tobacco  abuse hx 3 pack per day 20+ years.  5) Bloody secretions from aspiration - Pulm /renal syndrome ruled out. 6) Upper airway severe angioedema & rash from protamine  7) Likley small hematoma on rt apex from neck oozing  P:  - ATC as tolerated, 24x7 - Duonebs q6h + albuterol q2h PRN. - Continue steroids for rash - D/C solumedrol and decrease prednisone 20 mg PO daily. - Continue attempts PMSV, following command is the primary issue here that is obstructing that goal. - Hold heparin due to hemoptysis and bleeding from trach, improved but now will hold for PEG placement and place SCD's for DVT prophylaxis.  CARDIOVASCULAR  A:  1) New onset Afib with RVR - Unable to take oral amiodarone. On cardizem, amio. CE neg x 3. On heparin gtt. ? Back to NSR on 2/1 2) Hypotension - mild. With hx of HTN. 3) Anaphylaxis to protamine - protamine given in setting of trach placement, resultant anaphylactic reaction - resolved.  P:  - Goal HR < 120, cardizem gtt, currently at 10.-change to PO when able - Resume amio PO when able, may need amio IV if sustains AF - Continue PO dilt, 30 q6 hours PO today since NGT is in. - Anti-coagulation held for PE, DVT and a-fib due to trach bleeding,  hemoptysis and PEG placement.  RENAL  Lab 05/15/12 0500 05/14/12 0432  NA 145 140  K 3.1* 4.1  CL 107 105  CO2 27 25  BUN 24* 19  CREATININE 0.52 0.56  GLUCOSE 105* 119*  MG 1.8 2.0  PHOS 2.8 3.0   A:  1) Acute Kidney injury - Resolved. Previously likely pre-renal in setting of volume depletion. Previously urinary sediment clogging foley, improved. Continue to monitor in setting of lasix use. 2) Hypernatremia - resolved  3) Hypokalemia - Previously in setting of lasix no in setting of NPO.  P:  - Repleting IV KCl as needed. - Goal to keep even.  GASTROINTESTINAL No results found for this basename: AST:3,ALT:3,ALKPHOS:3,BILITOT:3,PROT:3,ALBUMIN:3 in the last 168 hours A:  1) GERD 2) Diarrhea - resolved. C.  Diff negative. 3) Colonic Ileus > resolved 4) Paraesophageal nodes - need FU CT in 3-4 mnths P: - Continue TF, Pepcid. - Minimize narcotics. - Failed Swallow eval again 1/31, NGT ordered but deferred 2/2 since he has pulled 2 GT already. Family confirmed desire for PEG placement, IR called 2/5 and will evaluate today, WBC elevation is due to stress and steroids, not evidence of infection.  HEMATOLOGIC  Lab 05/15/12 0500 05/14/12 0432  WBC 16.4* 24.3*  HGB 15.4 16.3  HCT 46.1 47.4  MCV 89.7 88.9  PLT 134* 141*   A:  1) Leukocytosis -  no fever. 3) Anticoagulation in setting of atrial fibrillation - New diagnosis. CHADS 3. Coumadin on home. On heparin gtt. 4) Bilateral PE 5) Bilateral DVT - IVC filter placement 1/27 P:  - SCD for DVT prophlaxis. - No coumadin for now until PEG is in place.  INFECTIOUS  Lab 05/15/12 0500 05/14/12 0432  WBC 16.4* 24.3*  NEUTROABS -- --   A: 1) Flu pneumonia - Flu A and metapneumovirus > finished Tamiflu x 10d, droplet isolation d/c 1/18 2) Aspirated at time of intubation, pneumonitis vs PNA> finished Rocephin x8d. Aspirated during reintubation.  3) Nosocomial infection - Left IJ (in 8d) dc'ed 1/13 and cath tip cultures>staph/coag neg > tx finished Vanc  4) Diarrhea - resolved. 5) Leukocytosis - likely 2/2 steroids. Ceftaz 1/18 --> 1/29 w/ sputum cx negative. Procalcitonin 0.14 --> 0.15. WBC rising with low grade fever. P: Recultured 2/5 and if shows signs of occult infections then will start broad spectrum abx.  The WBC is improving with no fever, likely a combination of steroid and stress.  ENDOCRINE  Lab 05/15/12 0759 05/15/12 0334 05/14/12 2338 05/14/12 1950 05/14/12 1613  GLUCAP 141* 96 147* 144* 187*   A:  1) Diabetes mellitus, type 2 (A1c 7.1) - possible iatrogenic secondary to prolonged steroid use. 2) On steroids - CBGs at goal. Lantus off. P: ICU Hyperglycemic protocol  NEUROLOGIC / PSYCHIATRIC   A:  1) Delerium - persistent,  controlled on scheduled ativan, haldol and fentanyl, prn versed  P:  - Again, goal to stay off sedating drips - Continue scheduled haldol and decrease PRN fentanyl for now given persistent ileus. - Swallow study - when delirium is better under control. - Monitor QTc - haldol 5 q6 standing - Does not qualify for LTAC, SNF/trach beds requested (possibly out of state) awaiting input from case management. - PT/ OT consult. - IV Ativan. - CIR eval - good candidate once acute issues resolved primarily delirium.  GLOBAL:  PEG placement by IR then likely SNF placement after that, spoke with social work and will be d/ced likely Monday to CIR or SNF  after PEG is placed.  CLINICAL SUMMARY: 64 yoM with COPD (3ppd smoking) admitted 1/5 with VDRF 2/2 influenza A and asp PNA, intubated 04/13/11 - 1/15 for AW protection, aspirated during intubation. Reintubated emergently 1/18 for severe delerium & resp distress, purulent secretions noted. Trach 1/22. New PE, DVT BL, s/p IVF filter 1/27. Off heparin drip due to trach bleeding.  Awaiting PEG then placement.  Alyson Reedy, M.D. [facility] Pulmonary/Critical Care Medicine. Pager: [phone]. After hours pager: [phone].

## 2012-05-15 NOTE — Progress Notes (Signed)
Hypokalemia   K replaced  

## 2012-05-15 NOTE — Clinical Social Work Note (Signed)
CSW contacted by RN care manager Eunice Blase advising that patient will be getting PEG today. He is currently at 35% with his trach so may be ready to d/c early next week. Per Eunice Blase, SNF placement may be more appropriate as patient may not be able to tolerate the intensity of CIR. Family will be contacted and given bed offers.  Genelle Bal, MSW, LCSW [phone]

## 2012-05-15 NOTE — Progress Notes (Signed)
eLink Physician-Brief Progress Note Patient Name: Cody [name] DOB: 1951-01-21 MRN: [id number]  Date of Service  05/15/2012   HPI/Events of Note   IR not able to  Place PEG.  Needs a surgical approach  eICU Interventions  Consult CCS for surgical G tube    Intervention Category Major Interventions: Other: (peg issues)  Shan Levans 05/15/2012, 4:32 PM

## 2012-05-15 NOTE — Progress Notes (Signed)
150 ML of barium given.

## 2012-05-15 NOTE — Progress Notes (Signed)
Noted that patient continues with lethargy and unable to fully participate/progress in therapies. Pt will need to demonstrate ability to tolerate therapy before he would be appropriate for CIR. Discussed this with pt's partner yesterday. Will continue to follow to assess pt's progress. Discussed above with pt's RNCM. Please call [phone] for questions.

## 2012-05-15 NOTE — Progress Notes (Signed)
Physical Therapy Treatment Patient Details Name: Cody [name] MRN: [id number] DOB: [birth year] Today's Date: 05/15/2012 Time: 0865-7846 PT Time Calculation (min): 24 min  PT Assessment / Plan / Recommendation Comments on Treatment Session  Pt admitted with SOB and respiratory failure due to flu and PNA with VDRF1/5-1/15, 1/18-1/21 with trach and bronchoscopy 1/22. Alert throughout session and became tearful at end of session. Pt consistently following one step commands today. Continues to require a lot of physical assist and anticipate recovery will continue to be slow.     Follow Up Recommendations  CIR;Supervision/Assistance - 24 hour     Does the patient have the potential to tolerate intense rehabilitation    yes          Equipment Recommendations  Other (comment) (TBD)        Frequency Min 3X/week   Plan Discharge plan remains appropriate;Frequency remains appropriate    Precautions / Restrictions Precautions Precautions: Fall Precaution Comments: trach   Pertinent Vitals/Pain On 35% trach collar with SaO2 >95% throughout session HR 108-140 with EOB activity    Mobility  Bed Mobility Bed Mobility: Rolling Right;Left Sidelying to Sit;Sit to Supine;Scooting to [facility] Rolling Left: 1: +1 Total assist Rolling Left: Patient Percentage: 10% Left Sidelying to Sit: 1: +2 Total assist;HOB elevated Left Sidelying to Sit: Patient Percentage: 0% Sitting - Scoot to Edge of Bed: 1: +2 Total assist Sitting - Scoot to Edge of Bed: Patient Percentage: 20% Sit to Supine: 1: +2 Total assist;HOB flat Sit to Supine: Patient Percentage: 20% Scooting to HOB: 1: +2 Total assist Scooting to [facility]: Patient Percentage: 0% Details for Bed Mobility Assistance: pt turning head, assisting with bending RLE and reaching with RUE (hand placed on rail and pt too weak to grasp and pull); feet over EOB and then HOB elevated to 45 to assist with come to sit; pt initiating hip flexion and attempting to use  momentum to scoot to EOB; controling descent of trunk as lying down on his Lt side Transfers Transfers: Not assessed (pt shaking his head no and tearful)    Exercises General Exercises - Lower Extremity Ankle Circles/Pumps: AAROM;Both;5 reps;Supine Long Arc Quad: AROM;Both;Other reps (comment);Seated (3 reps; unable to fully extend knees actively) Heel Slides: AAROM;Both;Other reps (comment);Supine (3 reps) Heel Slides Limitations: pt actively helping with flexion, pushing against slight resistance to extend his legs       PT Goals Acute Rehab PT Goals Pt will Roll Supine to Right Side: with mod assist PT Goal: Rolling Supine to Right Side - Progress: Progressing toward goal Pt will Roll Supine to Left Side: with mod assist PT Goal: Rolling Supine to Left Side - Progress: Progressing toward goal Pt will go Supine/Side to Sit: with +2 total assist;with HOB not 0 degrees (comment degree);Other (comment) (pt=20%) PT Goal: Supine/Side to Sit - Progress: Progressing toward goal Pt will Sit at [facility] of Bed: with supervision;3-5 min;with unilateral upper extremity support PT Goal: Sit at [facility] Of Bed - Progress: Progressing toward goal Pt will go Sit to Supine/Side: with +2 total assist;with HOB 0 degrees;Other (comment) (pt=30%) PT Goal: Sit to Supine/Side - Progress: Progressing toward goal  Visit Information  Last PT Received On: 05/15/12 Assistance Needed: +2    Subjective Data  Subjective: tearful at end of session; trying to lip-speak (unintelligible)   Cognition  Cognition Overall Cognitive Status: Difficult to assess Difficult to assess due to: Tracheostomy Arousal/Alertness: Awake/alert Behavior During Session: Other (comment) (labile at end of session) Current Attention Level:  Sustained Following Commands: Follows one step commands consistently Safety/Judgement - Other Comments: not impulsive today    Balance  Static Sitting Balance Static Sitting - Balance Support: Left  upper extremity supported;Feet supported Static Sitting - Level of Assistance: 4: Min assist;5: Stand by assistance Static Sitting - Comment/# of Minutes: EOB ~13 minutes; balance varied between min assist and close stand-by; tends to lean Lt and posterior  End of Session PT - End of Session Equipment Utilized During Treatment: Oxygen Activity Tolerance: Patient limited by fatigue;Other (comment) (labile) Patient left: in bed;with call bell/phone within reach;with restraints reapplied (bil wrist restraints) Nurse Communication: Other (comment) (RN requested stay in bed as going to have PEG this pm)   GP     Ronney [employer]well 05/15/2012, 4:00 PM Pager [phone]

## 2012-05-15 NOTE — Procedures (Signed)
Fluoroscopic imaging of the abdomen failed to demonstrate an adequate window for percutaneous gastrostomy tube placement.   As such, the procedure was cancelled. Recommend surgical referral for placement of gastrostomy tube as indicated.

## 2012-05-16 ENCOUNTER — Encounter (HOSPITAL_COMMUNITY): Payer: Self-pay | Admitting: Internal Medicine

## 2012-05-16 DIAGNOSIS — R1310 Dysphagia, unspecified: Secondary | ICD-10-CM

## 2012-05-16 LAB — GLUCOSE, CAPILLARY
Glucose-Capillary: 150 mg/dL — ABNORMAL HIGH (ref 70–99)
Glucose-Capillary: 185 mg/dL — ABNORMAL HIGH (ref 70–99)
Glucose-Capillary: 187 mg/dL — ABNORMAL HIGH (ref 70–99)
Glucose-Capillary: 202 mg/dL — ABNORMAL HIGH (ref 70–99)
Glucose-Capillary: 110 mg/dL — ABNORMAL HIGH (ref 70–99)

## 2012-05-16 LAB — PHOSPHORUS: Phosphorus: 3.6 mg/dL (ref 2.3–4.6)

## 2012-05-16 LAB — CBC
Hemoglobin: 15.2 g/dL (ref 13.0–17.0)
MCH: 30.5 pg (ref 26.0–34.0)
MCV: 90.6 fL (ref 78.0–100.0)
RBC: 4.99 MIL/uL (ref 4.22–5.81)
WBC: 12.1 10*3/uL — ABNORMAL HIGH (ref 4.0–10.5)

## 2012-05-16 LAB — BASIC METABOLIC PANEL
CO2: 30 mEq/L (ref 19–32)
Calcium: 9.1 mg/dL (ref 8.4–10.5)
Chloride: 104 mEq/L (ref 96–112)
Creatinine, Ser: 0.53 mg/dL (ref 0.50–1.35)
Glucose, Bld: 159 mg/dL — ABNORMAL HIGH (ref 70–99)
Sodium: 142 mEq/L (ref 135–145)

## 2012-05-16 LAB — MAGNESIUM: Magnesium: 2 mg/dL (ref 1.5–2.5)

## 2012-05-16 MED ORDER — POTASSIUM CHLORIDE 20 MEQ/15ML (10%) PO LIQD: 40.0000 meq | Freq: Three times a day (TID) | ORAL | Status: DC

## 2012-05-16 MED ORDER — POTASSIUM CHLORIDE 20 MEQ/15ML (10%) PO LIQD
40.0000 meq | ORAL | Status: AC
  Administered 2012-05-16 (×2): 40 meq
  Filled 2012-05-16 (×2): qty 30

## 2012-05-16 NOTE — Progress Notes (Signed)
eLink Physician-Brief Progress Note Patient Name: Cody [name] DOB: December 30, 1950 MRN: [id number]  Date of Service  05/16/2012   HPI/Events of Note   Hypokalemia  eICU Interventions  Potassium replaced   Intervention Category Minor Interventions: Electrolytes abnormality - evaluation and management  DETERDING,ELIZABETH 05/16/2012, 5:44 AM

## 2012-05-16 NOTE — Consult Note (Signed)
Reason for Consult: G-Tube placement Referring Physician: Dr. Syliva Overman is an 62 y.o. male.  HPI: 49 yoM with COPD (3ppd smoking) admitted 1/5 with VDRF 2/2 influenza A and asp PNA, intubated 04/13/11 - 1/15 for AW protection, aspirated during intubation. Reintubated emergently 1/18 for severe delerium & resp distress, purulent secretions noted. Trach 1/22. New PE, DVT BL, s/p IVF filter 1/27. On hep gtt. Has failed swallow studies and has pulled out feeding tube several times.  Needs PEG tube placement.   Past Medical History  Diagnosis Date  . Stroke   . Hypertension   . Hyperlipidemia   . GERD (gastroesophageal reflux disease)   . COPD (chronic obstructive pulmonary disease)     History reviewed. No pertinent past surgical history.  Family History  Problem Relation Age of Onset  . COPD Mother     Social History:  reports that he has been smoking Cigarettes.  He has been smoking about 0.50 packs per day. He has never used smokeless tobacco. He reports that he does not drink alcohol or use illicit drugs.  Allergies:  Allergies  Allergen Reactions  . Protamine Anaphylaxis    Medications: I have reviewed the patient's current medications.  Results for orders placed during the hospital encounter of 04/12/12 (from the past 48 hour(s))  GLUCOSE, CAPILLARY     Status: Abnormal   Collection Time    05/14/12 12:21 PM      Result Value Range   Glucose-Capillary 145 (*) 70 - 99 mg/dL  GLUCOSE, CAPILLARY     Status: Abnormal   Collection Time    05/14/12  4:13 PM      Result Value Range   Glucose-Capillary 187 (*) 70 - 99 mg/dL  GLUCOSE, CAPILLARY     Status: Abnormal   Collection Time    05/14/12  7:50 PM      Result Value Range   Glucose-Capillary 144 (*) 70 - 99 mg/dL   Comment 1 Documented in Chart     Comment 2 Notify RN    GLUCOSE, CAPILLARY     Status: Abnormal   Collection Time    05/14/12 11:38 PM      Result Value Range   Glucose-Capillary 147 (*) 70  - 99 mg/dL   Comment 1 Documented in Chart     Comment 2 Notify RN    GLUCOSE, CAPILLARY     Status: None   Collection Time    05/15/12  3:34 AM      Result Value Range   Glucose-Capillary 96  70 - 99 mg/dL   Comment 1 Documented in Chart     Comment 2 Notify RN    CBC     Status: Abnormal   Collection Time    05/15/12  5:00 AM      Result Value Range   WBC 16.4 (*) 4.0 - 10.5 K/uL   RBC 5.14  4.22 - 5.81 MIL/uL   Hemoglobin 15.4  13.0 - 17.0 g/dL   HCT 21.3  08.6 - 57.8 %   MCV 89.7  78.0 - 100.0 fL   MCH 30.0  26.0 - 34.0 pg   MCHC 33.4  30.0 - 36.0 g/dL   RDW 46.9 (*) 62.9 - 52.8 %   Platelets 134 (*) 150 - 400 K/uL  BASIC METABOLIC PANEL     Status: Abnormal   Collection Time    05/15/12  5:00 AM      Result Value Range  Sodium 145  135 - 145 mEq/L   Potassium 3.1 (*) 3.5 - 5.1 mEq/L   Comment: DELTA CHECK NOTED   Chloride 107  96 - 112 mEq/L   CO2 27  19 - 32 mEq/L   Glucose, Bld 105 (*) 70 - 99 mg/dL   BUN 24 (*) 6 - 23 mg/dL   Creatinine, Ser 1.30  0.50 - 1.35 mg/dL   Calcium 8.7  8.4 - 86.5 mg/dL   GFR calc non Af Amer >90  >90 mL/min   GFR calc Af Amer >90  >90 mL/min   Comment:            The eGFR has been calculated     using the CKD EPI equation.     This calculation has not been     validated in all clinical     situations.     eGFR's persistently     <90 mL/min signify     possible Chronic Kidney Disease.  MAGNESIUM     Status: None   Collection Time    05/15/12  5:00 AM      Result Value Range   Magnesium 1.8  1.5 - 2.5 mg/dL  PHOSPHORUS     Status: None   Collection Time    05/15/12  5:00 AM      Result Value Range   Phosphorus 2.8  2.3 - 4.6 mg/dL  GLUCOSE, CAPILLARY     Status: Abnormal   Collection Time    05/15/12  7:59 AM      Result Value Range   Glucose-Capillary 141 (*) 70 - 99 mg/dL  GLUCOSE, CAPILLARY     Status: Abnormal   Collection Time    05/15/12 12:30 PM      Result Value Range   Glucose-Capillary 158 (*) 70 - 99 mg/dL   GLUCOSE, CAPILLARY     Status: Abnormal   Collection Time    05/15/12  3:52 PM      Result Value Range   Glucose-Capillary 163 (*) 70 - 99 mg/dL  GLUCOSE, CAPILLARY     Status: Abnormal   Collection Time    05/15/12  7:57 PM      Result Value Range   Glucose-Capillary 176 (*) 70 - 99 mg/dL  GLUCOSE, CAPILLARY     Status: Abnormal   Collection Time    05/15/12 11:29 PM      Result Value Range   Glucose-Capillary 159 (*) 70 - 99 mg/dL  GLUCOSE, CAPILLARY     Status: Abnormal   Collection Time    05/16/12  4:01 AM      Result Value Range   Glucose-Capillary 150 (*) 70 - 99 mg/dL  CBC     Status: Abnormal   Collection Time    05/16/12  4:16 AM      Result Value Range   WBC 12.1 (*) 4.0 - 10.5 K/uL   RBC 4.99  4.22 - 5.81 MIL/uL   Hemoglobin 15.2  13.0 - 17.0 g/dL   HCT 78.4  69.6 - 29.5 %   MCV 90.6  78.0 - 100.0 fL   MCH 30.5  26.0 - 34.0 pg   MCHC 33.6  30.0 - 36.0 g/dL   RDW 28.4 (*) 13.2 - 44.0 %   Platelets 137 (*) 150 - 400 K/uL  BASIC METABOLIC PANEL     Status: Abnormal   Collection Time    05/16/12  4:16 AM      Result Value Range  Sodium 142  135 - 145 mEq/L   Potassium 3.1 (*) 3.5 - 5.1 mEq/L   Chloride 104  96 - 112 mEq/L   CO2 30  19 - 32 mEq/L   Glucose, Bld 159 (*) 70 - 99 mg/dL   BUN 26 (*) 6 - 23 mg/dL   Creatinine, Ser 1.61  0.50 - 1.35 mg/dL   Calcium 9.1  8.4 - 09.6 mg/dL   GFR calc non Af Amer >90  >90 mL/min   GFR calc Af Amer >90  >90 mL/min   Comment:            The eGFR has been calculated     using the CKD EPI equation.     This calculation has not been     validated in all clinical     situations.     eGFR's persistently     <90 mL/min signify     possible Chronic Kidney Disease.  MAGNESIUM     Status: None   Collection Time    05/16/12  4:16 AM      Result Value Range   Magnesium 2.0  1.5 - 2.5 mg/dL  PHOSPHORUS     Status: None   Collection Time    05/16/12  4:16 AM      Result Value Range   Phosphorus 3.6  2.3 - 4.6 mg/dL   GLUCOSE, CAPILLARY     Status: Abnormal   Collection Time    05/16/12  8:04 AM      Result Value Range   Glucose-Capillary 185 (*) 70 - 99 mg/dL   Comment 1 Notify RN      Ir Fluoro Rm 30-60 Min  05/15/2012  *RADIOLOGY REPORT*  Clinical Data: Percutaneous gastrostomy tube placement  IR FLOURO RM 0-60 MIN  Comparison: Chest CT - 05/02/2012  Intravenous medications:  Fentanyl 50 mcg IV; Versed 2 mg IV; Glucagon 0.5 mg IV  Fluoroscopy time:  2.7 minutes  Complications:  None immediate  Findings:  Preprocedural spot fluoroscopic image demonstrates interval transit of enteric contrast with opacification of the transverse colon. Ultrasound scanning was utilized to demarcate the edge of the left lobe of the liver  A timeout was performed prior to the initiation of the procedure. A small amount of Fentanyl and Versed was administered secondary to patient agitation on the fluoroscopy table.  A Kumpe catheter was advanced to the level of the stomach under intermittent fluoroscopic guidance. After the administration of 0.5 mg of glucagon intravenously, the stomach was insufflated with air.  Despite angling the fluoroscopy table in multiple obliquities, an adequate window for safe percutaneous gastrostomy tube placement could not be obtained secondary to the colon overlying the anterior aspect of the stomach.  As such, the procedure was cancelled.  IMPRESSION: Cancelled a percutaneous gastrostomy tube placement secondary to lack of fluoroscopic window.  Surgical consultation for enteric tube placement is recommended.  Above findings discussed with pt's nurse, Dedra Skeens, RN at [phone].   Original Report Authenticated By: Tacey Ruiz, MD    Dg Abd Portable 1v  05/15/2012  *RADIOLOGY REPORT*  Clinical Data: 62 year old male with planned percutaneous gastrostomy placement.  Barium evaluation.  PORTABLE ABDOMEN - 1 VIEW  Comparison: 05/12/2012.  Findings: Supine portable views at 1042 hours.  Barium contrast remains in the  small bowel and has reached the ascending colon, but is not yet present in the transverse colon.  NG tube in place.  IVC filter re-identified. Nonobstructed bowel gas pattern.  Mildly  improved ventilation at the left lung base.  IMPRESSION: Barium has reached the ascending colon, not yet the transverse colon.  NG tube in place.   Original Report Authenticated By: Erskine Speed, M.D.     Review of Systems  Unable to perform ROS: medical condition  Gastrointestinal: Negative for nausea, vomiting and abdominal pain.   Blood pressure 115/75, pulse 96, temperature 98.3 F (36.8 C), temperature source Oral, resp. rate 20, height 5\' 10"  (1.778 m), weight 185 lb 10 oz (84.2 kg), SpO2 96.00%. Physical Exam  Constitutional:  Acutely ill appearing but not septic  HENT:  Head: Normocephalic and atraumatic.  Feeding Tube in right nares   Eyes: Conjunctivae are normal. Pupils are equal, round, and reactive to light.  Neck: Neck supple.  Trach in place   Cardiovascular: Normal rate and regular rhythm.   Respiratory: Effort normal and breath sounds normal.  GI: Soft. Bowel sounds are normal. He exhibits no distension. There is no tenderness.  No visible scars, angles have been marked   Genitourinary:  deferred  Musculoskeletal: He exhibits no edema.  Neurological:  Unable to eval   Skin: Skin is warm and dry.  Psychiatric:  Unable to eval    Assessment/Plan: 1. Dysphagia: The patient will need longer term feeding options.  The family has agreed to a G-tube placement.  This will be scheduled for early in the week.  Will have them hold his TFs at [facility] the day before surgery.    Cody [name] 05/16/2012, 10:33 AM

## 2012-05-16 NOTE — Consult Note (Signed)
Agree with above. Will likely set up gtube placement vs PEG early this week

## 2012-05-16 NOTE — Progress Notes (Signed)
PULMONARY  / CRITICAL CARE MEDICINE  Name: ICHAEL PULLARA MRN: [id number] DOB: November 11, 1950    LOS: 34  REFERRING MD:   EDP - Dr. Preston Fleeting  CHIEF COMPLAINT:  Shortness of breath  BRIEF PATIENT DESCRIPTION: 76 yoM with COPD (3ppd smoking) admitted 1/5 with VDRF 2/2 influenza A and asp PNA, intubated 04/13/11 - 1/15 for AW protection, aspirated during intubation. Reintubated emergently 1/18 for severe delerium & resp distress, purulent secretions noted. Trach 1/22. New PE, DVT BL, s/p IVF filter 1/27. On hep gtt.   LINES / TUBES: ETT 1/5 >>1/15 , 1/18 >> self extubated 1/21 >>> reintubated 1/21 L IJ 1/5 >> 1/13 R IJ 1/13 >> 1/16 RIJ 1/18 >> 1/28 R A-line 1/5 >>1/8 pulled out by pt Trach 1/22 IVC Filter 1/27 >> PICC 1/28 >>  CULTURES: Blood culture 1/5>> Negative Resp culture 1/6>> H. influenzae  Resp viral panel 1/6>> Influenza A and Metapneumovirus  Sputum Leigonella Culture 1/6>> Negative Bronch Sputum 1/6>> H. influenzae  Flu 1/5>> Negative Blood culture 01/13 >> Negative Catheter tip culture 1/13 >>Staph -coag neg-70 colonies(vanc sens)  C.diff PCR 1/14 >> Negative Resp culture 1/18 >>> Negative Blood culture 2/6>>>NTD Urine 2/6>>>E. Coli sensitivities pending.  ANTIBIOTICS: Zosyn 1/5>>1/9 - restarted 1/22 >>1/28 Azithro 1/5>>1/8  Vanc 1/6>>>1/8 - restarted 1/13 >>1/25 Tamiflu 1/6>>>1/16  Rocephin 1/9>>1/15 Ceftazidine 1/18 >>1/22>>>2/6>>>  SIGNIFICANT EVENTS:  1/5 Intubated in ED  1/5 Shock , levophed  1/6 ECHO - 55%, mod dil rv, PA pressures>>> not measured b/c no TR jet  1/6 Bronched- diffuse pus all lobes  1/7 increasing pCO2, increased RR on vent  1/8 Changed sedation to Precedex, more agitated than with versed  1/9 Changed sedation back to Versed, agitation greatly improved  1/10 scheduled haldol  1/12 Restarting precedex gtt due to persistent agitation 1/13 Febrile to 102.6 overnight. ? Catheter infection? DC'ed L IJ, placed new R IJ. 1/14 No fevers  overnight, agitation controlled on propofol (added 1/13), more interactive. 1/15 Had foul smelling stools overnight, therefore, C.diff PCR was sent by RN. 1/15 EXTUBATED, developed A.fib with RVR overnight. 1/16 Persistent episodes of desaturation., afib>started on cardizem drip  1/17 increased agitation  1/18 reintubated  1/20 Persistent A. Fib this morning on dilt drip.  Rates controlled between 99-125 1/21 Self extubated, given a chance and continued to be in respiratory distress so reintubated.  1/22 Tracheostomy, complicated by bleeding, angio edema from protamine 1/23 Continued agitation. 1/23 Neg acute ct head 1/24 PEG placement planned --> delayed bc of ileus 1/25 CTA showing PE BL --> started on heparin gtt. 1/26 BL LE DVT with mobile thrombus 1/27 IVC Filter placement. 1/28 PICC Line 1/29 Persistent ileus per abdominal x-ray. Attempting to limit narcotics. And 1/30 Tolerated PMSV placement for 15 minutes and fatigue and   LEVEL OF CARE:  ICU PRIMARY SERVICE:  PCCM CONSULTANTS:  None CODE STATUS: Full DIET:  TF on hold as repeatedly pulling tubes out. DVT Px:  Heparin gtt GI Px:  Protonix  SUBJECTIVE / INTERVAL HISTORY:  Remains confused, bleeding overnight from trach site.  Interval Events: - Doing TC 24x7 - difficulty with PMV - failed FEES 1/31 - dilt gtt at 10 - request made for NGT per IR 2/1 pm  VITAL SIGNS: Temp:  [97.4 F (36.3 C)-99.1 F (37.3 C)] 98.3 F (36.8 C) (02/08 0802) Pulse Rate:  [96-127] 96 (02/08 0751) Resp:  [14-22] 20 (02/08 0802) BP: (115-143)/(75-112) 115/75 mmHg (02/08 0621) SpO2:  [92 %-98 %] 96 % (02/08 0751)  FiO2 (%):  [28 %-40 %] 28 % (02/08 0751) Weight:  [84.2 kg (185 lb 10 oz)] 84.2 kg (185 lb 10 oz) (02/08 0500) HEMODYNAMICS: VENTILATOR SETTINGS: Vent Mode:  [-]  FiO2 (%):  [28 %-40 %] 28 % INTAKE / OUTPUT:  Intake/Output Summary (Last 24 hours) at 05/16/12 1058 Last data filed at 05/16/12 0600  Gross per 24 hour   Intake   1500 ml  Output    700 ml  Net    800 ml   PHYSICAL EXAMINATION: General: Tracheostomy in place, on ventilator, opens eyes to command and tracks around the room but not following any command.  Head: Normocephalic, Trach in place, no bleeding.  Lungs:  Diminished BS, coarse breath sounds clearing.Marland Kitchen  Heart: Irregularly irregular rhythm, regular rate.  Abdomen:  BS normoactive. Soft, Nondistended, non-tender.  No masses or organomegaly.  Extremities: No pretibial edema.    IMAGING:  1/30 - NG tube coiled overlying the proximal stomach.gaseous distention again noted.   1/25 - CTA chest - 1. Pulmonary embolus within segmental branches to the right upper lobe, left upper lobe and left lower lobe, extending distally. 2. Bilateral lower lobe airspace opacification is thought to reflect atelectasis, with trace bilateral pleural effusions. No definite evidence for aspiration. 3. Mild nonspecific left upper lobe opacities may reflect atelectasis.  1/26 - LE Dopplers - 1. Acute DVT right saphenofemoral junction, common femoral vein. 2. Acute DVT left saphenofemoral junction, left femoral vein, and mobile thrombus in the left common femoral vein.   DIAGNOSES: Principal Problem:   Acute respiratory failure with hypercapnia Active Problems:   HYPERLIPIDEMIA   TOBACCO USE   HYPERTENSION   GERD   COPD (chronic obstructive pulmonary disease)   Acute encephalopathy   Acute kidney injury   Atrial fibrillation   PE (pulmonary embolism)   Tracheostomy status  ASSESSMENT / PLAN:  PULMONARY A: 1) Acute hypercapnic respiratory failure/ARDS - secondary to flu, COPD exacerbation. Trach 1/22. 2) Aspiration - at time of intubation, finished course of treatment  3) Pneumonitis vs. CAP - b/l interstitial opacities on cxr, slightly increased at left, likely atelectasis. Completed 8/8 days rocephin, on tapering steroids  4) Pulmonary edema - improved. 4) Non-oxygen dependent COPD - h/o chronic  tobacco abuse hx 3 pack per day 20+ years.  5) Bloody secretions from aspiration - Pulm /renal syndrome ruled out. 6) Upper airway severe angioedema & rash from protamine  7) Likley small hematoma on rt apex from neck oozing  P:  - ATC as tolerated, 24x7 - Duonebs q6h + albuterol q2h PRN. - Continue steroids for rash - D/C solumedrol and decrease prednisone 20 mg PO daily. - Continue attempts PMSV, following command is the primary issue here that is obstructing that goal. - Hold heparin due to hemoptysis and bleeding from trach, improved but now will hold for PEG placement and place SCD's for DVT prophylaxis.  CARDIOVASCULAR  A:  1) New onset Afib with RVR - Unable to take oral amiodarone. On cardizem, amio. CE neg x 3. On heparin gtt. ? Back to NSR on 2/1 2) Hypotension - mild. With hx of HTN. 3) Anaphylaxis to protamine - protamine given in setting of trach placement, resultant anaphylactic reaction - resolved.  P:  - Goal HR < 120, cardizem gtt, currently at 10.-change to PO when able - Resume amio PO when able, may need amio IV if sustains AF - Continue PO dilt, 30 q6 hours PO today since NGT is in. - Anti-coagulation held  for PE, DVT and a-fib due to trach bleeding, hemoptysis and PEG placement.  RENAL  Recent Labs Lab 05/15/12 0500 05/16/12 0416  NA 145 142  K 3.1* 3.1*  CL 107 104  CO2 27 30  BUN 24* 26*  CREATININE 0.52 0.53  GLUCOSE 105* 159*  MG 1.8 2.0  PHOS 2.8 3.6   A:  1) Acute Kidney injury - Resolved. Previously likely pre-renal in setting of volume depletion. Previously urinary sediment clogging foley, improved. Continue to monitor in setting of lasix use. 2) Hypernatremia - resolved  3) Hypokalemia - Previously in setting of lasix no in setting of NPO.  P:  - Repleting IV KCl as needed. - Goal to keep even.  GASTROINTESTINAL No results found for this basename: AST, ALT, ALKPHOS, BILITOT, PROT, ALBUMIN,  in the last 168 hours A:  1) GERD 2)  Diarrhea - resolved. C. Diff negative. 3) Colonic Ileus > resolved 4) Paraesophageal nodes - need FU CT in 3-4 mnths P: - Continue TF, Pepcid. - Minimize narcotics. - Failed Swallow eval again 1/31, NGT ordered but deferred 2/2 since he has pulled 2 GT already. Family confirmed desire for PEG placement, IR called 2/5 and will evaluate today, WBC elevation is due to stress and steroids, not evidence of infection.  HEMATOLOGIC  Recent Labs Lab 05/15/12 0500 05/16/12 0416  WBC 16.4* 12.1*  HGB 15.4 15.2  HCT 46.1 45.2  MCV 89.7 90.6  PLT 134* 137*   A:  1) Leukocytosis -  no fever. 3) Anticoagulation in setting of atrial fibrillation - New diagnosis. CHADS 3. Coumadin on home. On heparin gtt. 4) Bilateral PE 5) Bilateral DVT - IVC filter placement 1/27 P:  - SCD for DVT prophlaxis. - No coumadin for now until PEG is in place.  INFECTIOUS  Recent Labs Lab 05/15/12 0500 05/16/12 0416  WBC 16.4* 12.1*   A: 1) Flu pneumonia - Flu A and metapneumovirus > finished Tamiflu x 10d, droplet isolation d/c 1/18 2) Aspirated at time of intubation, pneumonitis vs PNA> finished Rocephin x8d. Aspirated during reintubation.  3) Nosocomial infection - Left IJ (in 8d) dc'ed 1/13 and cath tip cultures>staph/coag neg > tx finished Vanc  4) Diarrhea - resolved. 5) Leukocytosis - likely 2/2 steroids. Ceftaz 1/18 --> 1/29 w/ sputum cx negative. Procalcitonin 0.14 --> 0.15. WBC rising with low grade fever. P: Recultured 2/5 with E coli in urine, ceftaz started 2/6, will continue for now.  ENDOCRINE  Recent Labs Lab 05/15/12 1552 05/15/12 1957 05/15/12 2329 05/16/12 0401 05/16/12 0804  GLUCAP 163* 176* 159* 150* 185*   A:  1) Diabetes mellitus, type 2 (A1c 7.1) - possible iatrogenic secondary to prolonged steroid use. 2) On steroids - CBGs at goal. Lantus off. P: ICU Hyperglycemic protocol  NEUROLOGIC / PSYCHIATRIC   A:  1) Delerium - persistent, controlled on scheduled ativan,  haldol and fentanyl, prn versed  P:  - Again, goal to stay off sedating drips - Continue scheduled haldol and decrease PRN fentanyl for now given persistent ileus. - Swallow study - when delirium is better under control. - Monitor QTc - haldol 5 q6 standing - Does not qualify for LTAC, SNF/trach beds requested (possibly out of state) awaiting input from case management. - PT/ OT consult. - IV Ativan. - CIR eval - good candidate once acute issues resolved primarily delirium.  GLOBAL:  IR was unable to place PEG, recommended surgery, will wait til Monday then call surgery to place PEG..  CLINICAL SUMMARY:  64 yoM with COPD (3ppd smoking) admitted 1/5 with VDRF 2/2 influenza A and asp PNA, intubated 04/13/11 - 1/15 for AW protection, aspirated during intubation. Reintubated emergently 1/18 for severe delerium & resp distress, purulent secretions noted. Trach 1/22. New PE, DVT BL, s/p IVF filter 1/27. Off heparin drip due to trach bleeding.  Awaiting PEG then placement.  Alyson Reedy, M.D. [facility] Pulmonary/Critical Care Medicine. Pager: [phone]. After hours pager: [phone].

## 2012-05-17 LAB — GLUCOSE, CAPILLARY
Glucose-Capillary: 143 mg/dL — ABNORMAL HIGH (ref 70–99)
Glucose-Capillary: 145 mg/dL — ABNORMAL HIGH (ref 70–99)
Glucose-Capillary: 190 mg/dL — ABNORMAL HIGH (ref 70–99)
Glucose-Capillary: 132 mg/dL — ABNORMAL HIGH (ref 70–99)

## 2012-05-17 LAB — BASIC METABOLIC PANEL
Calcium: 9.3 mg/dL (ref 8.4–10.5)
GFR calc Af Amer: >90 mL/min (ref >90)
GFR calc non Af Amer: >90 mL/min (ref >90)
Potassium: 3.6 mEq/L (ref 3.5–5.1)
Sodium: 141 mEq/L (ref 135–145)

## 2012-05-17 LAB — CBC
MCH: 30 pg (ref 26.0–34.0)
MCHC: 32.9 g/dL (ref 30.0–36.0)
RDW: 16.9 % — ABNORMAL HIGH (ref 11.5–15.5)

## 2012-05-17 LAB — PHOSPHORUS: Phosphorus: 3.6 mg/dL (ref 2.3–4.6)

## 2012-05-17 NOTE — Progress Notes (Signed)
PULMONARY  / CRITICAL CARE MEDICINE  Name: Cody [name] MRN: [id number] DOB: 02-14-1951    LOS: 35  REFERRING MD:   EDP - Dr. Preston Fleeting  CHIEF COMPLAINT:  Shortness of breath  BRIEF PATIENT DESCRIPTION: 33 yoM with COPD (3ppd smoking) admitted 1/5 with VDRF 2/2 influenza A and asp PNA, intubated 04/13/11 - 1/15 for AW protection, aspirated during intubation. Reintubated emergently 1/18 for severe delerium & resp distress, purulent secretions noted. Trach 1/22. New PE, DVT BL, s/p IVF filter 1/27. On hep gtt.   LINES / TUBES: ETT 1/5 >>1/15 , 1/18 >> self extubated 1/21 >>> reintubated 1/21 L IJ 1/5 >> 1/13 R IJ 1/13 >> 1/16 RIJ 1/18 >> 1/28 R A-line 1/5 >>1/8 pulled out by pt Trach 1/22 IVC Filter 1/27 >> PICC 1/28 >>  CULTURES: Blood culture 1/5>> Negative Resp culture 1/6>> H. influenzae  Resp viral panel 1/6>> Influenza A and Metapneumovirus  Sputum Leigonella Culture 1/6>> Negative Bronch Sputum 1/6>> H. influenzae  Flu 1/5>> Negative Blood culture 01/13 >> Negative Catheter tip culture 1/13 >>Staph -coag neg-70 colonies(vanc sens)  C.diff PCR 1/14 >> Negative Resp culture 1/18 >>> Negative Blood culture 2/6>>>NTD Urine 2/6>>>Enterococcous.  ANTIBIOTICS: Zosyn 1/5>>1/9 - restarted 1/22 >>1/28 Azithro 1/5>>1/8  Vanc 1/6>>>1/8 - restarted 1/13 >>1/25 Tamiflu 1/6>>>1/16  Rocephin 1/9>>1/15 Ceftazidine 1/18 >>1/22>>>2/6>>>  SIGNIFICANT EVENTS:  1/5 Intubated in ED  1/5 Shock , levophed  1/6 ECHO - 55%, mod dil rv, PA pressures>>> not measured b/c no TR jet  1/6 Bronched- diffuse pus all lobes  1/7 increasing pCO2, increased RR on vent  1/8 Changed sedation to Precedex, more agitated than with versed  1/9 Changed sedation back to Versed, agitation greatly improved  1/10 scheduled haldol  1/12 Restarting precedex gtt due to persistent agitation 1/13 Febrile to 102.6 overnight. ? Catheter infection? DC'ed L IJ, placed new R IJ. 1/14 No fevers overnight, agitation  controlled on propofol (added 1/13), more interactive. 1/15 Had foul smelling stools overnight, therefore, C.diff PCR was sent by RN. 1/15 EXTUBATED, developed A.fib with RVR overnight. 1/16 Persistent episodes of desaturation., afib>started on cardizem drip  1/17 increased agitation  1/18 reintubated  1/20 Persistent A. Fib this morning on dilt drip.  Rates controlled between 99-125 1/21 Self extubated, given a chance and continued to be in respiratory distress so reintubated.  1/22 Tracheostomy, complicated by bleeding, angio edema from protamine 1/23 Continued agitation. 1/23 Neg acute ct head 1/24 PEG placement planned --> delayed bc of ileus 1/25 CTA showing PE BL --> started on heparin gtt. 1/26 BL LE DVT with mobile thrombus 1/27 IVC Filter placement. 1/28 PICC Line 1/29 Persistent ileus per abdominal x-ray. Attempting to limit narcotics. And 1/30 Tolerated PMSV placement for 15 minutes and fatigue and   LEVEL OF CARE:  ICU PRIMARY SERVICE:  PCCM CONSULTANTS:  None CODE STATUS: Full DIET:  TF on hold as repeatedly pulling tubes out. DVT Px:  Heparin gtt GI Px:  Protonix  SUBJECTIVE / INTERVAL HISTORY:  Remains confused, bleeding overnight from trach site.  Interval Events: - Doing TC 24x7 - difficulty with PMV - failed FEES 1/31 - dilt gtt at 10 - request made for NGT per IR 2/1 pm  VITAL SIGNS: Temp:  [97.3 F (36.3 C)-98.9 F (37.2 C)] 97.4 F (36.3 C) (02/09 0758) Pulse Rate:  [95-104] 100 (02/09 0759) Resp:  [20] 20 (02/09 0758) BP: (115-152)/(76-97) 144/89 mmHg (02/09 0759) SpO2:  [90 %-98 %] 94 % (02/09 0808) FiO2 (%):  [  28 %-40 %] 35 % (02/09 0808) Weight:  [84.7 kg (186 lb 11.7 oz)] 84.7 kg (186 lb 11.7 oz) (02/09 0500) HEMODYNAMICS: VENTILATOR SETTINGS: Vent Mode:  [-]  FiO2 (%):  [28 %-40 %] 35 % INTAKE / OUTPUT:  Intake/Output Summary (Last 24 hours) at 05/17/12 1128 Last data filed at 05/17/12 0800  Gross per 24 hour  Intake   2250 ml   Output   1200 ml  Net   1050 ml   PHYSICAL EXAMINATION: General: Tracheostomy in place, on ventilator, opens eyes to command and tracks around the room but not following any command.  Head: Normocephalic, Trach in place, no bleeding.  Lungs:  Diminished BS, coarse breath sounds clearing.Marland Kitchen  Heart: Irregularly irregular rhythm, regular rate.  Abdomen:  BS normoactive. Soft, Nondistended, non-tender.  No masses or organomegaly.  Extremities: No pretibial edema.    IMAGING:  1/30 - NG tube coiled overlying the proximal stomach.gaseous distention again noted.   1/25 - CTA chest - 1. Pulmonary embolus within segmental branches to the right upper lobe, left upper lobe and left lower lobe, extending distally. 2. Bilateral lower lobe airspace opacification is thought to reflect atelectasis, with trace bilateral pleural effusions. No definite evidence for aspiration. 3. Mild nonspecific left upper lobe opacities may reflect atelectasis.  1/26 - LE Dopplers - 1. Acute DVT right saphenofemoral junction, common femoral vein. 2. Acute DVT left saphenofemoral junction, left femoral vein, and mobile thrombus in the left common femoral vein.   DIAGNOSES: Principal Problem:   Acute respiratory failure with hypercapnia Active Problems:   HYPERLIPIDEMIA   TOBACCO USE   HYPERTENSION   GERD   COPD (chronic obstructive pulmonary disease)   Acute encephalopathy   Acute kidney injury   Atrial fibrillation   PE (pulmonary embolism)   Tracheostomy status  ASSESSMENT / PLAN:  PULMONARY A: 1) Acute hypercapnic respiratory failure/ARDS - secondary to flu, COPD exacerbation. Trach 1/22. 2) Aspiration - at time of intubation, finished course of treatment  3) Pneumonitis vs. CAP - b/l interstitial opacities on cxr, slightly increased at left, likely atelectasis. Completed 8/8 days rocephin, on tapering steroids  4) Pulmonary edema - improved. 4) Non-oxygen dependent COPD - h/o chronic tobacco abuse hx 3  pack per day 20+ years.  5) Bloody secretions from aspiration - Pulm /renal syndrome ruled out. 6) Upper airway severe angioedema & rash from protamine  7) Likley small hematoma on rt apex from neck oozing  P:  - ATC as tolerated, 24x7. - Duonebs q6h + albuterol q2h PRN. - Continue steroids for rash - D/C solumedrol and decrease prednisone 20 mg PO daily. - Continue attempts PMSV, following command is the primary issue here that is obstructing that goal. - Hold heparin due to hemoptysis and bleeding from trach, improved but now will hold for PEG placement and place SCD's for DVT prophylaxis.  CARDIOVASCULAR  A:  1) New onset Afib with RVR - Unable to take oral amiodarone. On cardizem, amio. CE neg x 3. On heparin gtt. ? Back to NSR on 2/1 2) Hypotension - mild. With hx of HTN. 3) Anaphylaxis to protamine - protamine given in setting of trach placement, resultant anaphylactic reaction - resolved.  P:  - Goal HR < 120, cardizem gtt, currently at 10.-change to PO when able - Resume amio PO when able, may need amio IV if sustains AF - Continue PO dilt, 30 q6 hours PO today since NGT is in. - Anti-coagulation held for PE, DVT and a-fib  due to trach bleeding, hemoptysis and PEG placement.  RENAL  Recent Labs Lab 05/16/12 0416 05/17/12 0457  NA 142 141  K 3.1* 3.6  CL 104 102  CO2 30 30  BUN 26* 26*  CREATININE 0.53 0.45*  GLUCOSE 159* 148*  MG 2.0 2.1  PHOS 3.6 3.6   A:  1) Acute Kidney injury - Resolved. Previously likely pre-renal in setting of volume depletion. Previously urinary sediment clogging foley, improved. Continue to monitor in setting of lasix use. 2) Hypernatremia - resolved  3) Hypokalemia - Previously in setting of lasix no in setting of NPO.  P:  - Repleting IV KCl as needed. - Goal to keep even.  GASTROINTESTINAL No results found for this basename: AST, ALT, ALKPHOS, BILITOT, PROT, ALBUMIN,  in the last 168 hours A:  1) GERD 2) Diarrhea - resolved. C.  Diff negative. 3) Colonic Ileus > resolved 4) Paraesophageal nodes - need FU CT in 3-4 mnths P: - Continue TF, Pepcid. - Minimize narcotics. - Failed Swallow eval again 1/31, NGT ordered but deferred 2/2 since he has pulled 2 GT already. Family confirmed desire for PEG placement, IR called 2/5 and will evaluate today, WBC elevation is due to stress and steroids, not evidence of infection.  HEMATOLOGIC  Recent Labs Lab 05/16/12 0416 05/17/12 0457  WBC 12.1* 13.0*  HGB 15.2 14.7  HCT 45.2 44.7  MCV 90.6 91.2  PLT 137* 124*   A:  1) Leukocytosis -  no fever. 3) Anticoagulation in setting of atrial fibrillation - New diagnosis. CHADS 3. Coumadin on home. On heparin gtt. 4) Bilateral PE 5) Bilateral DVT - IVC filter placement 1/27 P:  - SCD for DVT prophlaxis. - No coumadin for now until PEG is in place.  INFECTIOUS  Recent Labs Lab 05/16/12 0416 05/17/12 0457  WBC 12.1* 13.0*   A: 1) Flu pneumonia - Flu A and metapneumovirus > finished Tamiflu x 10d, droplet isolation d/c 1/18 2) Aspirated at time of intubation, pneumonitis vs PNA> finished Rocephin x8d. Aspirated during reintubation.  3) Nosocomial infection - Left IJ (in 8d) dc'ed 1/13 and cath tip cultures>staph/coag neg > tx finished Vanc  4) Diarrhea - resolved. 5) Leukocytosis - likely 2/2 steroids. Ceftaz 1/18 --> 1/29 w/ sputum cx negative. Procalcitonin 0.14 --> 0.15. WBC rising with low grade fever. P: Recultured 2/5 with E coli in urine, ceftaz started 2/6, will continue for now.  ENDOCRINE  Recent Labs Lab 05/16/12 1547 05/16/12 2049 05/16/12 2357 05/17/12 0448 05/17/12 0757  GLUCAP 202* 110* 132* 130* 143*   A:  1) Diabetes mellitus, type 2 (A1c 7.1) - possible iatrogenic secondary to prolonged steroid use. 2) On steroids - CBGs at goal. Lantus off. P: ICU Hyperglycemic protocol  NEUROLOGIC / PSYCHIATRIC   A:  1) Delerium - persistent, controlled on scheduled ativan, haldol and fentanyl, prn  versed  P:  - Again, goal to stay off sedating drips - Continue scheduled haldol and decrease PRN fentanyl for now given persistent ileus. - Swallow study - when delirium is better under control but for now proceed with the PEG placement, will need to call surgery on Monday to place IR unable to. - Monitor QTc - haldol 5 q6 standing - Does not qualify for LTAC, SNF/trach beds requested (possibly out of state) awaiting input from case management. - PT/ OT consult. - IV Ativan. - CIR eval - good candidate once acute issues resolved primarily delirium.  GLOBAL:  IR was unable to place PEG,  recommended surgery, will wait til Monday then call surgery to place PEG..  CLINICAL SUMMARY: 31 yoM with COPD (3ppd smoking) admitted 1/5 with VDRF 2/2 influenza A and asp PNA, intubated 04/13/11 - 1/15 for AW protection, aspirated during intubation. Reintubated emergently 1/18 for severe delerium & resp distress, purulent secretions noted. Trach 1/22. New PE, DVT BL, s/p IVF filter 1/27. Off heparin drip due to trach bleeding.  Awaiting PEG then placement but will need to call surgery on Monday to place PEG since IR was unable to place PEG.  Alyson Reedy, M.D. [facility] Pulmonary/Critical Care Medicine. Pager: [phone]. After hours pager: [phone].

## 2012-05-17 NOTE — Progress Notes (Signed)
ANTIBIOTIC CONSULT NOTE - FOLLOW UP  Pharmacy Consult for Ceftazidime Indication: Enterobacter UTI  Allergies  Allergen Reactions  . Protamine Anaphylaxis    Patient Measurements: Height: 5\' 10"  (177.8 cm) Weight: 186 lb 11.7 oz (84.7 kg) IBW/kg (Calculated) : 73  Vital Signs: Temp: 97.4 F (36.3 C) (02/09 0758) Temp src: Oral (02/09 0758) BP: 144/89 mmHg (02/09 0759) Pulse Rate: 100 (02/09 0759) Intake/Output from previous day: 02/08 0701 - 02/09 0700 In: 2645 [I.V.:480; NG/GT:1965; IV Piggyback:200] Out: 1200 [Urine:1200] Intake/Output from this shift: Total I/O In: 80 [I.V.:20; NG/GT:60] Out: -   Labs:  Recent Labs  05/15/12 0500 05/16/12 0416 05/17/12 0457  WBC 16.4* 12.1* 13.0*  HGB 15.4 15.2 14.7  PLT 134* 137* 124*  CREATININE 0.52 0.53 0.45*   Estimated Creatinine Clearance: 97.6 ml/min (by C-G formula based on Cr of 0.45). No results found for this basename: VANCOTROUGH, VANCOPEAK, VANCORANDOM, GENTTROUGH, GENTPEAK, GENTRANDOM, TOBRATROUGH, TOBRAPEAK, TOBRARND, AMIKACINPEAK, AMIKACINTROU, AMIKACIN,  in the last 72 hours   Microbiology: 2/5: Urine culture: >100k Enterobacter aerogenes R to cefazolin  Assessment: Cody [name] is a 30 YOM on day 4 of ceftazidime for enterobacter UTI. He is afebrile and wbc count has been stable around 13 (noted pt on steroids). His renal function remains decent with an estimated crcl of 100 ml/min. Noted sensitivities are back on his culture but CCM would like to keep him on fortaz until Gtube/PEG can be placed.   Plan:  Continue ceftazidime at 1g IV q8h F/u abx de-escalation and LOT  Thank you,  Brett Fairy, PharmD, BCPS 05/17/2012 11:33 AM

## 2012-05-18 LAB — GLUCOSE, CAPILLARY
Glucose-Capillary: 126 mg/dL — ABNORMAL HIGH (ref 70–99)
Glucose-Capillary: 110 mg/dL — ABNORMAL HIGH (ref 70–99)
Glucose-Capillary: 120 mg/dL — ABNORMAL HIGH (ref 70–99)

## 2012-05-18 LAB — CBC
MCH: 29.9 pg (ref 26.0–34.0)
MCV: 91.4 fL (ref 78.0–100.0)
Platelets: 133 10*3/uL — ABNORMAL LOW (ref 150–400)
RBC: 4.91 MIL/uL (ref 4.22–5.81)
RDW: 16.5 % — ABNORMAL HIGH (ref 11.5–15.5)

## 2012-05-18 MED ORDER — PRO-STAT SUGAR FREE PO LIQD
30.0000 mL | Freq: Three times a day (TID) | ORAL | Status: DC
  Administered 2012-05-18 (×2): 30 mL
  Filled 2012-05-18 (×8): qty 30

## 2012-05-18 MED ORDER — METOPROLOL TARTRATE 1 MG/ML IV SOLN
5.0000 mg | Freq: Once | INTRAVENOUS | Status: AC
  Administered 2012-05-18: 5 mg via INTRAVENOUS
  Filled 2012-05-18: qty 5

## 2012-05-18 MED ORDER — POTASSIUM CHLORIDE 10 MEQ/50ML IV SOLN
10.0000 meq | INTRAVENOUS | Status: AC
  Administered 2012-05-18 (×2): 10 meq via INTRAVENOUS
  Filled 2012-05-18 (×2): qty 50

## 2012-05-18 NOTE — Progress Notes (Signed)
Agitation   Restrains reordered 

## 2012-05-18 NOTE — Progress Notes (Signed)
Physical Therapy Treatment Patient Details Name: Cody [name] MRN: [id number] DOB: 04/29/1950 Today's Date: 05/18/2012 Time: 4098-1191 PT Time Calculation (min): 46 min  PT Assessment / Plan / Recommendation Comments on Treatment Session  Pt admitted with SOB and respiratory failure due to flu and PNA with VDRF1/5-1/15, 1/18-1/21 with trach and bronchoscopy 1/22. Improved participation and arousal today. Improved standing tolerance today.     Follow Up Recommendations  CIR;Supervision/Assistance - 24 hour     Does the patient have the potential to tolerate intense rehabilitation     Barriers to Discharge        Equipment Recommendations  None recommended by PT    Recommendations for Other Services    Frequency Min 3X/week   Plan Discharge plan remains appropriate;Frequency remains appropriate    Precautions / Restrictions Precautions Precautions: Fall Restrictions Weight Bearing Restrictions: No   Pertinent Vitals/Pain Trach collar, SaO2 maintain 90% or above throughout session    Mobility  Bed Mobility Bed Mobility: Rolling Right;Rolling Left;Left Sidelying to Sit Rolling Left: 2: Max assist Left Sidelying to Sit: 2: Max assist Supine to Sit: 2: Max assist Details for Bed Mobility Assistance: max sequencing cues and facilitation for trunk rotation and knee flexion as well as assist to reach with RUE to grab railing for rolling; facilitation to elevate trunk from bed level and stabilize Transfers Transfers: Sit to Stand;Stand to Sit;Stand Pivot Transfers Sit to Stand: 1: +2 Total assist;From bed;With upper extremity assist Stand to Sit: 1: +2 Total assist;To bed;To chair/3-in-1 Stand Pivot Transfers: 1: +2 Total assist (bed->chair (to the right)) Stand Pivot Transfers: Patient Percentage: 30% Details for Transfer Assistance: bilateral facilitation to bring trunk over BOS as well as to extend through knees and hips (likely tightnes in hamstrings in addition to weakness  making tall standing difficut); SPT (crouched posture) needing facilitation to keep trunk over BOS as well as assist to weight shift and sequence movement Ambulation/Gait Ambulation/Gait Assistance: Not tested (comment)    Exercises General Exercises - Lower Extremity Ankle Circles/Pumps: AAROM;Both;10 reps;Supine Short Arc Quad: AAROM;Both;5 reps;Supine Hip ABduction/ADduction: AAROM;Both;5 reps;Supine      PT Goals Acute Rehab PT Goals PT Goal Formulation: With patient Time For Goal Achievement: 05/25/12 Pt will Roll Supine to Right Side: with mod assist PT Goal: Rolling Supine to Right Side - Progress: Progressing toward goal Pt will Roll Supine to Left Side: with mod assist PT Goal: Rolling Supine to Left Side - Progress: Progressing toward goal Pt will go Supine/Side to Sit: with mod assist PT Goal: Supine/Side to Sit - Progress: Progressing toward goal Pt will Sit at Edge of Bed: with supervision;3-5 min;with unilateral upper extremity support PT Goal: Sit at Edge Of Bed - Progress: Progressing toward goal Pt will go Sit to Stand: with +2 total assist (50) PT Goal: Sit to Stand - Progress: Progressing toward goal Pt will go Stand to Sit: with +2 total assist (50%) PT Goal: Stand to Sit - Progress: Progressing toward goal Pt will Transfer Bed to Chair/Chair to Bed: with +2 total assist (50%) PT Transfer Goal: Bed to Chair/Chair to Bed - Progress: Goal set today Pt will Ambulate: with +2 total assist  Visit Information  Last PT Received On: 05/18/12 Assistance Needed: +2    Subjective Data  Subjective: nodded yes to OOB    Cognition  Cognition Overall Cognitive Status: Difficult to assess Difficult to assess due to: Tracheostomy Arousal/Alertness: Awake/alert Current Attention Level: Sustained Following Commands: Follows one step commands consistently;Follows multi-step commands  inconsistently Cognition - Other Comments: more participative today however still needs a  lot of motivation as he fatigues and things become more difficult    Balance  Static Sitting Balance Static Sitting - Balance Support: Bilateral upper extremity supported Static Sitting - Level of Assistance: 4: Min assist Static Sitting - Comment/# of Minutes: facilitation as well as cues for tall posture with midline orientation (tends to fall posteriorly and to the left, especially as he fatigues); sat EOB at least 5 minutes prior to trying standing activity Static Standing Balance Static Standing - Balance Support: Bilateral upper extremity supported (40%) Static Standing - Level of Assistance: 1: +2 Total assist;Patient percentage (comment) Static Standing - Comment/# of Minutes: stood 30 seconds x2 with facilitation bilaterally for trunk and lower extremity extension (both knees stay flexed in standing); definitely improved participation today holding more of his own weight, fatigues quickly  End of Session PT - End of Session Equipment Utilized During Treatment: Gait belt Patient left: in bed;with call bell/phone within reach Nurse Communication: Mobility status;Need for lift equipment   GP     [facility] HELEN 05/18/2012, 11:22 AM

## 2012-05-18 NOTE — Clinical Social Work Note (Signed)
Clinical Social Worker is still following for potential snf placement at discharge. Patient currently does not have any bed offers.   Rozetta Nunnery MSW, [employer] [phone]

## 2012-05-18 NOTE — Progress Notes (Signed)
PULMONARY  / CRITICAL CARE MEDICINE  Name: Cody [name] MRN: [id number] DOB: [birth year]    LOS: 36  REFERRING MD:   EDP - Dr. Preston Fleeting  CHIEF COMPLAINT:  Shortness of breath  BRIEF PATIENT DESCRIPTION: 46 yoM with COPD (3ppd smoking) admitted 1/5 with VDRF 2/2 influenza A and asp PNA, intubated 04/13/11 - 1/15 for AW protection, aspirated during intubation. Reintubated emergently 1/18 for severe delerium & resp distress, purulent secretions noted. Trach 1/22. New PE, DVT BL, s/p IVF filter 1/27. On hep gtt.   LINES / TUBES: ETT 1/5 >>1/15 , 1/18 >> self extubated 1/21 >>> reintubated 1/21 L IJ 1/5 >> 1/13 R IJ 1/13 >> 1/16 RIJ 1/18 >> 1/28 R A-line 1/5 >>1/8 pulled out by pt Trach 1/22 IVC Filter 1/27 >> PICC 1/28 >>  CULTURES: Blood culture 1/5>> Negative Resp culture 1/6>> H. influenzae  Resp viral panel 1/6>> Influenza A and Metapneumovirus  Sputum Leigonella Culture 1/6>> Negative Bronch Sputum 1/6>> H. influenzae  Flu 1/5>> Negative Blood culture 01/13 >> Negative Catheter tip culture 1/13 >>Staph -coag neg-70 colonies(vanc sens)  C.diff PCR 1/14 >> Negative Resp culture 1/18 >>> Negative Blood culture 2/6>>>NTD Urine 2/6>>>Enterococcous.  ANTIBIOTICS: Zosyn 1/5>>1/9 - restarted 1/22 >>1/28 Azithro 1/5>>1/8  Vanc 1/6>>>1/8 - restarted 1/13 >>1/25 Tamiflu 1/6>>>1/16  Rocephin 1/9>>1/15 Ceftazidine 1/18 >>1/22>>>2/6>>>  SIGNIFICANT EVENTS:  1/5 Intubated in ED  1/5 Shock , levophed  1/6 ECHO - 55%, mod dil rv, PA pressures>>> not measured b/c no TR jet  1/6 Bronched- diffuse pus all lobes  1/7 increasing pCO2, increased RR on vent  1/8 Changed sedation to Precedex, more agitated than with versed  1/9 Changed sedation back to Versed, agitation greatly improved  1/10 scheduled haldol  1/12 Restarting precedex gtt due to persistent agitation 1/13 Febrile to 102.6 overnight. ? Catheter infection? DC'ed L IJ, placed new R IJ. 1/14 No fevers overnight, agitation  controlled on propofol (added 1/13), more interactive. 1/15 Had foul smelling stools overnight, therefore, C.diff PCR was sent by RN. 1/15 EXTUBATED, developed A.fib with RVR overnight. 1/16 Persistent episodes of desaturation., afib>started on cardizem drip  1/17 increased agitation  1/18 reintubated  1/20 Persistent A. Fib this morning on dilt drip.  Rates controlled between 99-125 1/21 Self extubated, given a chance and continued to be in respiratory distress so reintubated.  1/22 Tracheostomy, complicated by bleeding, angio edema from protamine 1/23 Continued agitation. 1/23 Neg acute ct head 1/24 PEG placement planned --> delayed bc of ileus 1/25 CTA showing PE BL --> started on heparin gtt. 1/26 BL LE DVT with mobile thrombus 1/27 IVC Filter placement. 1/28 PICC Line 1/29 Persistent ileus per abdominal x-ray. Attempting to limit narcotics. And 1/30 Tolerated PMSV placement for 15 minutes and fatigue and   LEVEL OF CARE:  ICU PRIMARY SERVICE:  PCCM CONSULTANTS:  None CODE STATUS: Full DIET:  TF on hold as repeatedly pulling tubes out. DVT Px:  Heparin gtt GI Px:  Protonix  SUBJECTIVE / INTERVAL HISTORY:  Remains confused, bleeding overnight from trach site.  Interval Events: - Doing TC 24x7 - difficulty with PMV - failed FEES 1/31 - dilt gtt at 10 - request made for NGT per IR 2/1 pm  VITAL SIGNS: Temp:  [98.4 F (36.9 C)-99.4 F (37.4 C)] 98.4 F (36.9 C) (02/10 0757) Pulse Rate:  [83-111] 83 (02/10 0929) Resp:  [18-21] 20 (02/10 0929) BP: (117-155)/(77-106) 117/83 mmHg (02/10 0945) SpO2:  [89 %-96 %] 95 % (02/10 0945) FiO2 (%):  [  35 %] 35 % (02/10 1015) Weight:  [85.4 kg (188 lb 4.4 oz)] 85.4 kg (188 lb 4.4 oz) (02/10 0500) HEMODYNAMICS: VENTILATOR SETTINGS: Vent Mode:  [-]  FiO2 (%):  [35 %] 35 % INTAKE / OUTPUT:  Intake/Output Summary (Last 24 hours) at 05/18/12 1042 Last data filed at 05/18/12 0900  Gross per 24 hour  Intake   1350 ml  Output    550  ml  Net    800 ml   PHYSICAL EXAMINATION: General: Tracheostomy in place, on ventilator, opens eyes to command and tracks around the room but not following any command.  Head: Normocephalic, Trach in place, no bleeding.  Lungs:  Diminished BS, coarse breath sounds clearing.Marland Kitchen  Heart: Irregularly irregular rhythm, regular rate.  Abdomen:  BS normoactive. Soft, Nondistended, non-tender.  No masses or organomegaly.  Extremities: No pretibial edema.    IMAGING:  1/30 - NG tube coiled overlying the proximal stomach.gaseous distention again noted.   1/25 - CTA chest - 1. Pulmonary embolus within segmental branches to the right upper lobe, left upper lobe and left lower lobe, extending distally. 2. Bilateral lower lobe airspace opacification is thought to reflect atelectasis, with trace bilateral pleural effusions. No definite evidence for aspiration. 3. Mild nonspecific left upper lobe opacities may reflect atelectasis.  1/26 - LE Dopplers - 1. Acute DVT right saphenofemoral junction, common femoral vein. 2. Acute DVT left saphenofemoral junction, left femoral vein, and mobile thrombus in the left common femoral vein.   DIAGNOSES: Principal Problem:   Acute respiratory failure with hypercapnia Active Problems:   HYPERLIPIDEMIA   TOBACCO USE   HYPERTENSION   GERD   COPD (chronic obstructive pulmonary disease)   Acute encephalopathy   Acute kidney injury   Atrial fibrillation   PE (pulmonary embolism)   Tracheostomy status  ASSESSMENT / PLAN:  PULMONARY A: 1) Acute hypercapnic respiratory failure/ARDS - secondary to flu, COPD exacerbation. Trach 1/22. 2) Aspiration - at time of intubation, finished course of treatment  3) Pneumonitis vs. CAP - b/l interstitial opacities on cxr, slightly increased at left, likely atelectasis. Completed 8/8 days rocephin, on tapering steroids  4) Pulmonary edema - improved. 4) Non-oxygen dependent COPD - h/o chronic tobacco abuse hx 3 pack per day 20+  years.  5) Bloody secretions from aspiration - Pulm /renal syndrome ruled out. 6) Upper airway severe angioedema & rash from protamine  7) Likley small hematoma on rt apex from neck oozing  P:  - ATC as tolerated, 24x7, going for surgical PEG today, will likely come out on the vent and will need reweaning but will reexamine after patient is out of the OR. - Duonebs q6h + albuterol q2h PRN. - Continue steroids for rash - D/C solumedrol and decrease prednisone 20 mg PO daily. - Continue attempts PMSV, following command is the primary issue here that is obstructing that goal. - PEG today at noon and will attempt to restart heparin/coumadin after PEG is in place.  CARDIOVASCULAR  A:  1) New onset Afib with RVR - Unable to take oral amiodarone. On cardizem, amio. CE neg x 3. On heparin gtt. ? Back to NSR on 2/1 2) Hypotension - mild. With hx of HTN. 3) Anaphylaxis to protamine - protamine given in setting of trach placement, resultant anaphylactic reaction - resolved.  P:  - Goal HR < 120, cardizem gtt, currently at 10.-change to PO when able - Resume amio PO when able, may need amio IV if sustains AF - Continue PO  dilt, 30 q6 hours PO today since NGT is in. - Anti-coagulation held for PE, DVT and a-fib due to trach bleeding, hemoptysis and PEG placement.  Will likely restart heparin/coumadin once PEG is in place.  RENAL  Recent Labs Lab 05/16/12 0416 05/17/12 0457  NA 142 141  K 3.1* 3.6  CL 104 102  CO2 30 30  BUN 26* 26*  CREATININE 0.53 0.45*  GLUCOSE 159* 148*  MG 2.0 2.1  PHOS 3.6 3.6   A:  1) Acute Kidney injury - Resolved. Previously likely pre-renal in setting of volume depletion. Previously urinary sediment clogging foley, improved. Continue to monitor in setting of lasix use. 2) Hypernatremia - resolved  3) Hypokalemia - Previously in setting of lasix no in setting of NPO.  P:  - Repleting IV KCl as needed until PEG is in palce. - Goal to keep  even.  GASTROINTESTINAL No results found for this basename: AST, ALT, ALKPHOS, BILITOT, PROT, ALBUMIN,  in the last 168 hours A:  1) GERD 2) Diarrhea - resolved. C. Diff negative. 3) Colonic Ileus > resolved 4) Paraesophageal nodes - need FU CT in 3-4 mnths P: - Continue TF, Pepcid. - Minimize narcotics. - Failed Swallow eval again 1/31, NGT ordered but deferred 2/2 since he has pulled 2 GT already. Family confirmed desire for PEG placement, IR called 2/5 and will evaluate today, WBC elevation is due to stress and steroids, not evidence of infection.  PEG 2/10.  HEMATOLOGIC  Recent Labs Lab 05/17/12 0457 05/18/12 0500  WBC 13.0* 15.9*  HGB 14.7 14.7  HCT 44.7 44.9  MCV 91.2 91.4  PLT 124* 133*   A:  1) Leukocytosis -  no fever. 3) Anticoagulation in setting of atrial fibrillation - New diagnosis. CHADS 3. Coumadin on home. On heparin gtt. 4) Bilateral PE 5) Bilateral DVT - IVC filter placement 1/27 P:  - SCD for DVT prophlaxis. - No coumadin for now until PEG is in place.  After insertion will start heparin/coumadin in AM.  INFECTIOUS  Recent Labs Lab 05/17/12 0457 05/18/12 0500  WBC 13.0* 15.9*   A: 1) Flu pneumonia - Flu A and metapneumovirus > finished Tamiflu x 10d, droplet isolation d/c 1/18 2) Aspirated at time of intubation, pneumonitis vs PNA> finished Rocephin x8d. Aspirated during reintubation.  3) Nosocomial infection - Left IJ (in 8d) dc'ed 1/13 and cath tip cultures>staph/coag neg > tx finished Vanc  4) Diarrhea - resolved. 5) Leukocytosis - likely 2/2 steroids. Ceftaz 1/18 --> 1/29 w/ sputum cx negative. Procalcitonin 0.14 --> 0.15. WBC rising with low grade fever. P: Recultured 2/5 with enterococcous in urine, ceftaz started 2/6, will continue for now.  ENDOCRINE  Recent Labs Lab 05/17/12 1545 05/17/12 1944 05/18/12 0049 05/18/12 0439 05/18/12 0818  GLUCAP 190* 132* 126* 150* 138*   A:  1) Diabetes mellitus, type 2 (A1c 7.1) - possible  iatrogenic secondary to prolonged steroid use. 2) On steroids - CBGs at goal. Lantus off. P: ICU Hyperglycemic protocol  NEUROLOGIC / PSYCHIATRIC   A:  1) Delerium - persistent, controlled on scheduled ativan, haldol and fentanyl, prn versed  P:  - Again, goal to stay off sedating drips - Continue scheduled haldol and decrease PRN fentanyl for now given persistent ileus. - Swallow study - when delirium is better under control but for now proceed with the PEG placement, will need to call surgery on Monday to place IR unable to. - Monitor QTc - haldol 5 q6 standing - Does not  qualify for LTAC, SNF/trach beds requested (possibly out of state) awaiting input from case management. - PT/ OT consult. - IV Ativan. - CIR eval - good candidate once acute issues resolved primarily delirium.  GLOBAL:  IR was unable to place PEG, recommended surgery, will wait til Monday then call surgery to place PEG..  CLINICAL SUMMARY: 66 yoM with COPD (3ppd smoking) admitted 1/5 with VDRF 2/2 influenza A and asp PNA, intubated 04/13/11 - 1/15 for AW protection, aspirated during intubation. Reintubated emergently 1/18 for severe delerium & resp distress, purulent secretions noted. Trach 1/22. New PE, DVT BL, s/p IVF filter 1/27. Off heparin drip due to trach bleeding.  Awaiting PEG then placement but will need to call surgery on Monday to place PEG since IR was unable to place PEG.  Alyson Reedy, M.D. [facility] Pulmonary/Critical Care Medicine. Pager: [phone]. After hours pager: [phone].

## 2012-05-18 NOTE — Progress Notes (Signed)
Passy-Muir Speaking Valve - Treatment Patient Details  Name: Cody [name] MRN: [id number] Date of Birth: 15-Oct-1950  Today's Date: 05/18/2012 Time: 1000-1015 SLP Time Calculation (min): 15 min  Past Medical History:  Past Medical History  Diagnosis Date  . Stroke   . Hypertension   . Hyperlipidemia   . GERD (gastroesophageal reflux disease)   . COPD (chronic obstructive pulmonary disease)    Past Surgical History: History reviewed. No pertinent past surgical history.  Assessment / Plan / Recommendation Clinical Impression  Pt. seen today with PMSV to assess current efficiency with valve, vocal quality, respiratory support, appropriateness to attempt po's at bedside (scheduled for PEG tomorrow).  Pt. is alert sitting in chair and visibly frustrated.  Pt's cuff deflated upon arrival and SLP donned speaking valve.  He is unable to effecively move air through vocal cords for phonation and attempts result in a wet whisper with significantly decreased intelligibility in words. He required total verbal cues to perform deep inhalation and speak in one word utterances as he is mouthing multiple words at once.  Pt. would have increased success with PMSV if trach downsized as he currently has a #8 cuffed, deflated Shiley.  Unfortunately pt. is not ready for a repeat swallow assessment at this time and SLP agrees with more long term means of nutrition, however SLP will continue to work with pt. on increasing PMSV tolerance and dysphagia.  Please consider trach change.    Plan  Continue with current plan of care    Follow Up Recommendations  Skilled Nursing facility    Pertinent Vitals/Pain Affirmed pain (bottom), Notified RN    SLP Goals Potential to Achieve Goals: Good Potential Considerations: Severity of impairments Progress/Goals/Alternative treatment plan discussed with pt/caregiver and they: Agree SLP Goal #1: Pt will tolerate PMSV during waking hours, including therapies and  throughout mealtimes. SLP Goal #1 - Progress:  (not progressing likely due to #8 cuffed trach) SLP Goal #2: Pt will demonstrate appropriate volume and breath support to facilitate effective communication. SLP Goal #2 - Progress: Progressing toward goal (not progressing) SLP Goal #3: Pt/family will partipate in education and training re: use, care and cleaning of PMSV, and return demonstration of this education. SLP Goal #3 - Progress:  (not progressing)   PMSV Trial  PMSV was placed for: 3-5 min Able to redirect subglottic air through upper airway: No Able to Attain Phonation: No Level of Secretion Expectoration with PMSV: Tracheal Breath Support for Phonation: Mildly decreased Intelligibility: Intelligibility reduced Word: 0-24% accurate Phrase: 0-24% accurate Respirations During Trial:  (WNL) SpO2 During Trial: 94 % Pulse During Trial: 89 Behavior: Alert;Tense (frustrated)   Tracheostomy Tube  Additional Tracheostomy Tube Assessment Level of Secretion Expectoration: Tracheal    Vent Dependency  FiO2 (%): 35 %    Cuff Deflation Trial       Tolerated Cuff Deflation:  (deflated on arrival) Behavior: Alert (fruatrated)   Darrow Bussing.Ed [employer] [phone]  05/18/2012

## 2012-05-18 NOTE — Progress Notes (Signed)
General Surgery Note  LOS: 36 days  Room - 2922  Assessment/Plan: 1  Called for PEG placement, IR unable to to this.  The patient has failed swallowing. Discussed indications and complications of procedure with patient and son.  Risks included bleeding, infection, leak for bowel, and injury to other bowel.  It could make his respiratory condition worse.  The patient was being fed this AM.  Will aim for PEG tomorrow AM. 2.  On trach collar,   Tracheostomy 04/29/2012 3.  Admitted for Influenza A and aspiration pneumonia 4.  Bilateral DVT  IVC filter - 05/04/2012  5.  Atrial fibrillation 6.  Diabetes mellitus, Type 2 - possible related to prolonged steroids 7.  Delirium  Seems to understand plan. 8.  Long term smoker 9.  Nutrition - on tube feedings, will hold these at MN 10.  Deconditioned.  Subjective:  Sitting in chair, though weak.  Son, Clevon Khader in room.  Phone:  [phone] Objective:   Filed Vitals:   05/18/12 0757  BP: 144/91  Pulse: 94  Temp: 98.4 F (36.9 C)  Resp:      Intake/Output from previous day:  02/09 0701 - 02/10 0700 In: 1550 [I.V.:240; NG/GT:1210; IV Piggyback:100] Out: 550 [Urine:550]  Intake/Output this shift:      Physical Exam:   General: Older WM who is alert and oriented.    HEENT: Normal. Pupils equal. .   Lungs: Junky, coughs   Abdomen: No abdominal incisions.   Lab Results:    Recent Labs  05/17/12 0457 05/18/12 0500  WBC 13.0* 15.9*  HGB 14.7 14.7  HCT 44.7 44.9  PLT 124* 133*    BMET   Recent Labs  05/16/12 0416 05/17/12 0457  NA 142 141  K 3.1* 3.6  CL 104 102  CO2 30 30  GLUCOSE 159* 148*  BUN 26* 26*  CREATININE 0.53 0.45*  CALCIUM 9.1 9.3    PT/INR  No results found for this basename: LABPROT, INR,  in the last 72 hours  ABG  No results found for this basename: PHART, PCO2, PO2, HCO3,  in the last 72 hours   Studies/Results:  No results found.   Anti-infectives:   Anti-infectives   Start      Dose/Rate Route Frequency Ordered Stop   05/15/12 0000  ceFAZolin (ANCEF) IVPB 2 g/50 mL premix    Comments:  Hang ON CALL to IR 2/7   2 g 100 mL/hr over 30 Minutes Intravenous  Once 05/14/12 1230 05/15/12 0055   05/14/12 1800  cefTAZidime (FORTAZ) 1 g in dextrose 5 % 50 mL IVPB     1 g 100 mL/hr over 30 Minutes Intravenous 3 times per day 05/14/12 1702     05/14/12 1645  cefTRIAXone (ROCEPHIN) 1 g in dextrose 5 % 50 mL IVPB  Status:  Discontinued     1 g 100 mL/hr over 30 Minutes Intravenous Every 24 hours 05/14/12 1632 05/14/12 1633   04/29/12 1400  piperacillin-tazobactam (ZOSYN) IVPB 3.375 g  Status:  Discontinued     3.375 g 12.5 mL/hr over 240 Minutes Intravenous 3 times per day 04/29/12 1051 05/06/12 1111   04/26/12 1946  vancomycin (VANCOCIN) IVPB 1000 mg/200 mL premix  Status:  Discontinued     1,000 mg 200 mL/hr over 60 Minutes Intravenous Every 8 hours 04/26/12 1429 05/02/12 1002   04/25/12 1200  cefTAZidime (FORTAZ) 1 g in dextrose 5 % 50 mL IVPB  Status:  Discontinued     1  g 100 mL/hr over 30 Minutes Intravenous 3 times per day 04/25/12 1106 04/29/12 1051   04/20/12 1215  vancomycin (VANCOCIN) IVPB 1000 mg/200 mL premix  Status:  Discontinued     1,000 mg 200 mL/hr over 60 Minutes Intravenous Every 12 hours 04/20/12 1153 04/26/12 1429   04/17/12 0000  cefTRIAXone (ROCEPHIN) 1 g in dextrose 5 % 50 mL IVPB  Status:  Discontinued     1 g 120 mL/hr over 30 Minutes Intravenous Every 24 hours 04/16/12 1516 04/22/12 1639   04/14/12 1100  vancomycin (VANCOCIN) IVPB 1000 mg/200 mL premix  Status:  Discontinued     1,000 mg 200 mL/hr over 60 Minutes Intravenous Every 12 hours 04/14/12 0821 04/15/12 1005   04/13/12 2300  vancomycin (VANCOCIN) 750 mg in sodium chloride 0.9 % 150 mL IVPB  Status:  Discontinued     750 mg 150 mL/hr over 60 Minutes Intravenous Every 12 hours 04/13/12 1347 04/14/12 0821   04/13/12 1400  oseltamivir (TAMIFLU) 6 MG/ML suspension 150 mg     150 mg Oral 2  times daily 04/13/12 1327 04/22/12 2210   04/13/12 1400  vancomycin (VANCOCIN) 1,500 mg in sodium chloride 0.9 % 500 mL IVPB     1,500 mg 250 mL/hr over 120 Minutes Intravenous  Once 04/13/12 1347 04/13/12 1706   04/13/12 0600  piperacillin-tazobactam (ZOSYN) IVPB 3.375 g     3.375 g 12.5 mL/hr over 240 Minutes Intravenous 3 times per day 04/13/12 0102 04/17/12 0101   04/12/12 2200  piperacillin-tazobactam (ZOSYN) IVPB 3.375 g     3.375 g 100 mL/hr over 30 Minutes Intravenous  Once 04/12/12 2148 04/12/12 2347   04/12/12 2145  azithromycin (ZITHROMAX) 500 mg in dextrose 5 % 250 mL IVPB  Status:  Discontinued     500 mg 250 mL/hr over 60 Minutes Intravenous Every 24 hours 04/12/12 2143 04/15/12 1005      Ovidio Kin, MD, FACS Pager: [phone],   [facility] Office: [phone] 05/18/2012

## 2012-05-18 NOTE — Progress Notes (Signed)
Pt's DBP was in the 100s, PA on call was paged order given for 5mg  of metoprolol, but now DBP is in the 80's and med was not given, will pass off to am nurse to give it when needed for high DBP after md had been notified.-----Eppie Barhorst, rn

## 2012-05-18 NOTE — Progress Notes (Signed)
Pt's DBP has been in the 100s, PA on call paged, orders given. Will cont. To monitor pt------Laurelle Skiver, rn

## 2012-05-18 NOTE — Progress Notes (Signed)
NUTRITION FOLLOW UP  Intervention:   1. Decrease Pro-stat to 30 ml TID. Continue TF formula and rate.   2. RD will continue to follow    Nutrition Dx:   Inadequate oral intake, ongoing   Goal:   Intake to meet >/=90% estimated nutrition needs. Met  Monitor:   TF tolerance, PEG placement, weight trends, labs  Assessment:   Pt continues with NG tube. Pt was planned for PEG placement in IR on 2/7 but was not able to be placed. Was planned for surgical placement of PEG this morning, but per RN pt TF was not held so placement had to be postponed. Now planned for tomorrow. Continues to received Jevity 1.2, will be held at midnight.   Current TF regimen is Jevity 1.2 at 60 ml/hr and 60 ml Pro-stat TID, this is providing 2328 kcal, 170 gm protein, and 1162 ml free water.   Height: Ht Readings from Last 1 Encounters:  09/19/10 5' 10.5" (1.791 m)    Weight Status:   Wt Readings from Last 1 Encounters:  05/18/12 188 lb 4.4 oz (85.4 kg)    Re-estimated needs:  Kcal: 2000-2200 kcal  Protein: 125-135 gm  Fluid: >/= 1.2 L daily   Skin: stage 1 pressure ulcer to buttocks   Diet Order: NPO   Intake/Output Summary (Last 24 hours) at 05/18/12 1028 Last data filed at 05/18/12 0900  Gross per 24 hour  Intake   1350 ml  Output    550 ml  Net    800 ml    Last BM: 2/8   Labs:   Recent Labs Lab 05/15/12 0500 05/16/12 0416 05/17/12 0457  NA 145 142 141  K 3.1* 3.1* 3.6  CL 107 104 102  CO2 27 30 30   BUN 24* 26* 26*  CREATININE 0.52 0.53 0.45*  CALCIUM 8.7 9.1 9.3  MG 1.8 2.0 2.1  PHOS 2.8 3.6 3.6  GLUCOSE 105* 159* 148*    CBG (last 3)   Recent Labs  05/18/12 0049 05/18/12 0439 05/18/12 0818  GLUCAP 126* 150* 138*    Scheduled Meds: . amiodarone  400 mg Oral Daily  . antiseptic oral rinse  15 mL Mouth Rinse QID  . cefTAZidime (FORTAZ)  IV  1 g Intravenous Q8H  . chlorhexidine  15 mL Mouth Rinse BID  . clonazePAM  1 mg Per Tube BID  . diltiazem  30 mg  Per Tube Q6H  . famotidine (PEPCID) IV  20 mg Intravenous Daily  . feeding supplement  60 mL Per Tube TID  . haloperidol lactate  5 mg Intravenous Q6H  . insulin aspart  0-15 Units Subcutaneous Q4H  . ipratropium  0.5 mg Nebulization Q6H  . levalbuterol  0.63 mg Nebulization Q6H  . multivitamin  5 mL Per Tube Daily  . predniSONE  20 mg Per Tube Daily  . sodium chloride  10-40 mL Intracatheter Q12H    Continuous Infusions: . sodium chloride 20 mL/hr at 05/18/12 0900  . sodium chloride 10 mL/hr at 05/10/12 2125  . feeding supplement (JEVITY 1.2 CAL) 1,000 mL (05/17/12 0827)    Clarene Duke RD, LDN Pager [phone] After Hours pager [phone]

## 2012-05-19 ENCOUNTER — Encounter (HOSPITAL_COMMUNITY): Payer: Self-pay | Admitting: Anesthesiology

## 2012-05-19 ENCOUNTER — Encounter (HOSPITAL_COMMUNITY)
Admission: EM | Disposition: A | Discharge status: Rehabilitation Facility | Payer: Self-pay | Source: Home / Self Care | Attending: Pulmonary Disease

## 2012-05-19 ENCOUNTER — Inpatient Hospital Stay (HOSPITAL_COMMUNITY): Payer: Medicaid Other | Admitting: Anesthesiology

## 2012-05-19 HISTORY — PX: GASTROSTOMY: SHX5249

## 2012-05-19 LAB — BASIC METABOLIC PANEL
BUN: 22 mg/dL (ref 6–23)
CO2: 32 mEq/L (ref 19–32)
Calcium: 9.1 mg/dL (ref 8.4–10.5)
GFR calc non Af Amer: >90 mL/min (ref >90)
Glucose, Bld: 95 mg/dL (ref 70–99)
Potassium: 3.5 mEq/L (ref 3.5–5.1)

## 2012-05-19 LAB — PHOSPHORUS: Phosphorus: 3.4 mg/dL (ref 2.3–4.6)

## 2012-05-19 LAB — CBC
HCT: 44.3 % (ref 39.0–52.0)
Hemoglobin: 14.6 g/dL (ref 13.0–17.0)
MCH: 30.1 pg (ref 26.0–34.0)
MCHC: 33 g/dL (ref 30.0–36.0)
RBC: 4.85 MIL/uL (ref 4.22–5.81)

## 2012-05-19 LAB — GLUCOSE, CAPILLARY
Glucose-Capillary: 82 mg/dL (ref 70–99)
Glucose-Capillary: 130 mg/dL — ABNORMAL HIGH (ref 70–99)
Glucose-Capillary: 96 mg/dL (ref 70–99)
Glucose-Capillary: 172 mg/dL — ABNORMAL HIGH (ref 70–99)

## 2012-05-19 LAB — CULTURE, BLOOD (ROUTINE X 2): Culture: NO GROWTH

## 2012-05-19 SURGERY — INSERTION OF GASTROSTOMY TUBE
Anesthesia: General | Wound class: Clean Contaminated

## 2012-05-19 MED ORDER — MIDAZOLAM HCL 5 MG/5ML IJ SOLN
INTRAMUSCULAR | Status: DC | PRN
  Administered 2012-05-19: 2 mg via INTRAVENOUS

## 2012-05-19 MED ORDER — DEXTROSE-NACL 5-0.45 % IV SOLN
INTRAVENOUS | Status: DC
  Administered 2012-05-19: 05:00:00 via INTRAVENOUS

## 2012-05-19 MED ORDER — LACTATED RINGERS IV SOLN
INTRAVENOUS | Status: DC
  Administered 2012-05-19: 10:00:00 via INTRAVENOUS

## 2012-05-19 MED ORDER — LACTATED RINGERS IV SOLN
INTRAVENOUS | Status: DC | PRN
  Administered 2012-05-19: 10:00:00 via INTRAVENOUS

## 2012-05-19 MED ORDER — LIDOCAINE HCL 2 % IJ SOLN
INTRAMUSCULAR | Status: AC
  Filled 2012-05-19: qty 20

## 2012-05-19 MED ORDER — FUROSEMIDE 10 MG/ML IJ SOLN
40.0000 mg | Freq: Three times a day (TID) | INTRAMUSCULAR | Status: AC
  Administered 2012-05-19: 40 mg via INTRAVENOUS
  Filled 2012-05-19: qty 4

## 2012-05-19 MED ORDER — FENTANYL CITRATE 0.05 MG/ML IJ SOLN
INTRAMUSCULAR | Status: DC | PRN
  Administered 2012-05-19: 50 ug via INTRAVENOUS

## 2012-05-19 MED ORDER — POTASSIUM CHLORIDE 10 MEQ/50ML IV SOLN
10.0000 meq | INTRAVENOUS | Status: AC
  Administered 2012-05-19 (×4): 10 meq via INTRAVENOUS
  Filled 2012-05-19: qty 200

## 2012-05-19 MED ORDER — LIDOCAINE HCL (PF) 1 % IJ SOLN
INTRAMUSCULAR | Status: DC | PRN
  Administered 2012-05-19: 5 mL

## 2012-05-19 MED ORDER — PHENYLEPHRINE HCL 10 MG/ML IJ SOLN
INTRAMUSCULAR | Status: DC | PRN
  Administered 2012-05-19 (×2): 80 ug via INTRAVENOUS

## 2012-05-19 SURGICAL SUPPLY — 33 items
BLADE SURG ROTATE 9660 (MISCELLANEOUS) IMPLANT
CANISTER SUCTION 2500CC (MISCELLANEOUS) IMPLANT
CATH MUSHROOM 20FR (CATHETERS) IMPLANT
CATH MUSHROOM 22FR (CATHETERS) IMPLANT
CHLORAPREP W/TINT 26ML (MISCELLANEOUS) IMPLANT
CLOTH BEACON ORANGE TIMEOUT ST (SAFETY) IMPLANT
COVER SURGICAL LIGHT HANDLE (MISCELLANEOUS) IMPLANT
DECANTER SPIKE VIAL GLASS SM (MISCELLANEOUS) IMPLANT
DRAPE LAPAROTOMY T 102X78X121 (DRAPES) IMPLANT
DRAPE UTILITY 15X26 W/TAPE STR (DRAPE) IMPLANT
ELECT REM PT RETURN 9FT ADLT (ELECTROSURGICAL) ×2
ELECTRODE REM PT RTRN 9FT ADLT (ELECTROSURGICAL) ×1 IMPLANT
GLOVE SURG SIGNA 7.5 PF LTX (GLOVE) IMPLANT
GOWN STRL NON-REIN LRG LVL3 (GOWN DISPOSABLE) IMPLANT
GOWN STRL REIN XL XLG (GOWN DISPOSABLE) IMPLANT
KIT BASIN OR (CUSTOM PROCEDURE TRAY) IMPLANT
KIT ROOM TURNOVER OR (KITS) ×2 IMPLANT
NEEDLE HYPO 25GX1X1/2 BEV (NEEDLE) IMPLANT
NS IRRIG 1000ML POUR BTL (IV SOLUTION) IMPLANT
PACK GENERAL/GYN (CUSTOM PROCEDURE TRAY) IMPLANT
PAD ARMBOARD 7.5X6 YLW CONV (MISCELLANEOUS) ×2 IMPLANT
PLUG CATH AND CAP STER (CATHETERS) IMPLANT
SPONGE GAUZE 4X4 12PLY (GAUZE/BANDAGES/DRESSINGS) IMPLANT
SPONGE LAP 18X18 X RAY DECT (DISPOSABLE) IMPLANT
STAPLER VISISTAT 35W (STAPLE) IMPLANT
SUT NOVA 1 T20/GS 25DT (SUTURE) IMPLANT
SUT NOVA NAB DX-16 0-1 5-0 T12 (SUTURE) IMPLANT
SUT VIC AB 2-0 SH 18 (SUTURE) IMPLANT
SYR CONTROL 10ML LL (SYRINGE) IMPLANT
TAPE CLOTH SURG 4X10 WHT LF (GAUZE/BANDAGES/DRESSINGS) ×2 IMPLANT
TOWEL OR 17X24 6PK STRL BLUE (TOWEL DISPOSABLE) IMPLANT
TOWEL OR 17X26 10 PK STRL BLUE (TOWEL DISPOSABLE) IMPLANT
WATER STERILE IRR 1000ML POUR (IV SOLUTION) IMPLANT

## 2012-05-19 NOTE — Anesthesia Preprocedure Evaluation (Addendum)
Anesthesia Evaluation  Patient identified by MRN, date of birth, ID band Patient awake    Reviewed: Allergy & Precautions, H&P , NPO status , Patient's Chart, lab work & pertinent test results, reviewed documented beta blocker date and time   History of Anesthesia Complications Negative for: history of anesthetic complications  Airway Mallampati: II TM Distance: >3 FB Neck ROM: Full    Dental  (+) Dental Advisory Given   Pulmonary COPDCurrent Smoker, PE Trach in place         Cardiovascular hypertension, Pt. on medications + dysrhythmias Atrial Fibrillation     Neuro/Psych Anxiety CVA, No Residual Symptoms    GI/Hepatic Neg liver ROS, GERD-  Medicated,  Endo/Other  negative endocrine ROS  Renal/GU ARFRenal disease     Musculoskeletal negative musculoskeletal ROS (+)   Abdominal   Peds  Hematology negative hematology ROS (+)   Anesthesia Other Findings   Reproductive/Obstetrics negative OB ROS                         Anesthesia Physical Anesthesia Plan  ASA: IV  Anesthesia Plan: General   Post-op Pain Management:    Induction: Intravenous  Airway Management Planned: Oral ETT  Additional Equipment:   Intra-op Plan:   Post-operative Plan: Possible Post-op intubation/ventilation  Informed Consent:   Plan Discussed with: CRNA and Surgeon  Anesthesia Plan Comments:         Anesthesia Quick Evaluation

## 2012-05-19 NOTE — Progress Notes (Signed)
Elink: 5:13 AM @ 05/19/2012   RN says patient going for PEG. Tube feeds off. Sugar now 80s  Plan Change fluids to d5 half normal saline at kvo   Dr. Kalman Shan, M.D., [facility].C.P Pulmonary and Critical Care Medicine Staff Physician [facility] Pulmonary and Critical Care Pager: [phone], If no answer or between  15:00h - 7:00h: call [phone]  05/19/2012 5:14 AM

## 2012-05-19 NOTE — Progress Notes (Signed)
Pt. Localizing painful stimuli, otherwise he is nonresponsive.  Cough/gag present.  CCM notified; scheduled Haldol to be held.

## 2012-05-19 NOTE — Transfer of Care (Signed)
Immediate Anesthesia Transfer of Care Note  Patient: Cody [name]  Procedure(s) Performed: Procedure(s): PEG Possible Open Gastrostomy (N/A)  Patient Location: PACU  Anesthesia Type:General  Level of Consciousness: awake, alert  and oriented  Airway & Oxygen Therapy: Patient Spontanous Breathing and Patient connected to tracheostomy mask oxygen  Post-op Assessment: Report given to PACU RN and Post -op Vital signs reviewed and stable  Post vital signs: Reviewed and stable  Complications: No apparent anesthesia complications

## 2012-05-19 NOTE — Progress Notes (Signed)
PULMONARY  / CRITICAL CARE MEDICINE  Name: Cody [name] MRN: [id number] DOB: 1950/12/15    LOS: 37  REFERRING MD:   EDP - Dr. Preston Fleeting  CHIEF COMPLAINT:  Shortness of breath  BRIEF PATIENT DESCRIPTION: 63 yoM with COPD (3ppd smoking) admitted 1/5 with VDRF 2/2 influenza A and asp PNA, intubated 04/13/11 - 1/15 for AW protection, aspirated during intubation. Reintubated emergently 1/18 for severe delerium & resp distress, purulent secretions noted. Trach 1/22. New PE, DVT BL, s/p IVF filter 1/27. On hep gtt.   LINES / TUBES: ETT 1/5 >>1/15 , 1/18 >> self extubated 1/21 >>> reintubated 1/21 L IJ 1/5 >> 1/13 R IJ 1/13 >> 1/16 RIJ 1/18 >> 1/28 R A-line 1/5 >>1/8 pulled out by pt Trach 1/22 IVC Filter 1/27 >> PICC 1/28 >>  CULTURES: Blood culture 1/5>> Negative Resp culture 1/6>> H. influenzae  Resp viral panel 1/6>> Influenza A and Metapneumovirus  Sputum Leigonella Culture 1/6>> Negative Bronch Sputum 1/6>> H. influenzae  Flu 1/5>> Negative Blood culture 01/13 >> Negative Catheter tip culture 1/13 >>Staph -coag neg-70 colonies(vanc sens)  C.diff PCR 1/14 >> Negative Resp culture 1/18 >>> Negative Blood culture 2/6>>>NTD Urine 2/6>>>Enterococcous.  ANTIBIOTICS: Zosyn 1/5>>1/9 - restarted 1/22 >>1/28 Azithro 1/5>>1/8  Vanc 1/6>>>1/8 - restarted 1/13 >>1/25 Tamiflu 1/6>>>1/16  Rocephin 1/9>>1/15 Ceftazidine 1/18 >>1/22>>>2/6>>>  SIGNIFICANT EVENTS:  1/5 Intubated in ED  1/5 Shock , levophed  1/6 ECHO - 55%, mod dil rv, PA pressures>>> not measured b/c no TR jet  1/6 Bronched- diffuse pus all lobes  1/7 increasing pCO2, increased RR on vent  1/8 Changed sedation to Precedex, more agitated than with versed  1/9 Changed sedation back to Versed, agitation greatly improved  1/10 scheduled haldol  1/12 Restarting precedex gtt due to persistent agitation 1/13 Febrile to 102.6 overnight. ? Catheter infection? DC'ed L IJ, placed new R IJ. 1/14 No fevers overnight, agitation  controlled on propofol (added 1/13), more interactive. 1/15 Had foul smelling stools overnight, therefore, C.diff PCR was sent by RN. 1/15 EXTUBATED, developed A.fib with RVR overnight. 1/16 Persistent episodes of desaturation., afib>started on cardizem drip  1/17 increased agitation  1/18 reintubated  1/20 Persistent A. Fib this morning on dilt drip.  Rates controlled between 99-125 1/21 Self extubated, given a chance and continued to be in respiratory distress so reintubated.  1/22 Tracheostomy, complicated by bleeding, angio edema from protamine 1/23 Continued agitation. 1/23 Neg acute ct head 1/24 PEG placement planned --> delayed bc of ileus 1/25 CTA showing PE BL --> started on heparin gtt. 1/26 BL LE DVT with mobile thrombus 1/27 IVC Filter placement. 1/28 PICC Line 1/29 Persistent ileus per abdominal x-ray. Attempting to limit narcotics. And 1/30 Tolerated PMSV placement for 15 minutes and fatigue and   LEVEL OF CARE:  ICU PRIMARY SERVICE:  PCCM CONSULTANTS:  None CODE STATUS: Full DIET:  TF on hold as repeatedly pulling tubes out. DVT Px:  Heparin gtt GI Px:  Protonix  SUBJECTIVE / INTERVAL HISTORY:  Remains confused, bleeding overnight from trach site.  Interval Events: - Doing TC 24x7 - difficulty with PMV - failed FEES 1/31 - dilt gtt at 10 - request made for NGT per IR 2/1 pm  VITAL SIGNS: Temp:  [97.3 F (36.3 C)-99.2 F (37.3 C)] 98.6 F (37 C) (02/11 0800) Pulse Rate:  [82-96] 84 (02/11 0739) Resp:  [18-22] 20 (02/11 0738) BP: (95-157)/(62-105) 106/72 mmHg (02/11 0800) SpO2:  [86 %-96 %] 86 % (02/11 0800) FiO2 (%):  [  28 %-35 %] 35 % (02/11 0900) HEMODYNAMICS: VENTILATOR SETTINGS: Vent Mode:  [-]  FiO2 (%):  [28 %-35 %] 35 % INTAKE / OUTPUT:  Intake/Output Summary (Last 24 hours) at 05/19/12 1033 Last data filed at 05/19/12 0700  Gross per 24 hour  Intake    300 ml  Output    600 ml  Net   -300 ml   PHYSICAL EXAMINATION: General:  Tracheostomy in place, on ventilator, opens eyes to command and tracks around the room but not following any command.  Head: Normocephalic, Trach in place, no bleeding.  Lungs:  Diminished BS, coarse breath sounds clearing.Marland Kitchen  Heart: Irregularly irregular rhythm, regular rate.  Abdomen:  BS normoactive. Soft, Nondistended, non-tender.  No masses or organomegaly.  Extremities: No pretibial edema.    IMAGING:  1/30 - NG tube coiled overlying the proximal stomach.gaseous distention again noted.   1/25 - CTA chest - 1. Pulmonary embolus within segmental branches to the right upper lobe, left upper lobe and left lower lobe, extending distally. 2. Bilateral lower lobe airspace opacification is thought to reflect atelectasis, with trace bilateral pleural effusions. No definite evidence for aspiration. 3. Mild nonspecific left upper lobe opacities may reflect atelectasis.  1/26 - LE Dopplers - 1. Acute DVT right saphenofemoral junction, common femoral vein. 2. Acute DVT left saphenofemoral junction, left femoral vein, and mobile thrombus in the left common femoral vein.   DIAGNOSES: Principal Problem:   Acute respiratory failure with hypercapnia Active Problems:   HYPERLIPIDEMIA   TOBACCO USE   HYPERTENSION   GERD   COPD (chronic obstructive pulmonary disease)   Acute encephalopathy   Acute kidney injury   Atrial fibrillation   PE (pulmonary embolism)   Tracheostomy status  ASSESSMENT / PLAN:  PULMONARY A: 1) Acute hypercapnic respiratory failure/ARDS - secondary to flu, COPD exacerbation. Trach 1/22. 2) Aspiration - at time of intubation, finished course of treatment  3) Pneumonitis vs. CAP - b/l interstitial opacities on cxr, slightly increased at left, likely atelectasis. Completed 8/8 days rocephin, on tapering steroids  4) Pulmonary edema - improved. 4) Non-oxygen dependent COPD - h/o chronic tobacco abuse hx 3 pack per day 20+ years.  5) Bloody secretions from aspiration - Pulm  /renal syndrome ruled out. 6) Upper airway severe angioedema & rash from protamine  7) Likley small hematoma on rt apex from neck oozing  P:  - ATC as tolerated, 24x7, going for surgical PEG today, will likely come out on the vent and will need reweaning but will reexamine after patient is out of the OR. - Duonebs q6h + albuterol q2h PRN. - Continue steroids for rash - D/C solumedrol and decrease prednisone 20 mg PO daily. - Continue attempts PMSV, following command is the primary issue here that is obstructing that goal. - PEG today at noon and will attempt to restart heparin/coumadin after PEG is in place.  CARDIOVASCULAR  A:  1) New onset Afib with RVR - Unable to take oral amiodarone. On cardizem, amio. CE neg x 3. On heparin gtt. ? Back to NSR on 2/1 2) Hypotension - mild. With hx of HTN. 3) Anaphylaxis to protamine - protamine given in setting of trach placement, resultant anaphylactic reaction - resolved.  P:  - Goal HR < 120, cardizem gtt, currently at 10.-change to PO when able - Resume amio PO when able, may need amio IV if sustains AF - Continue PO dilt, 30 q6 hours PO today since NGT is in. - Anti-coagulation held for  PE, DVT and a-fib due to trach bleeding, hemoptysis and PEG placement.  Will likely restart heparin/coumadin once PEG is in place.  RENAL  Recent Labs Lab 05/17/12 0457 05/19/12 0511  NA 141 143  K 3.6 3.5  CL 102 104  CO2 30 32  BUN 26* 22  CREATININE 0.45* 0.41*  GLUCOSE 148* 95  MG 2.1 2.2  PHOS 3.6 3.4   A:  1) Acute Kidney injury - Resolved. Previously likely pre-renal in setting of volume depletion. Previously urinary sediment clogging foley, improved. Continue to monitor in setting of lasix use. 2) Hypernatremia - resolved  3) Hypokalemia - Previously in setting of lasix no in setting of NPO.  P:  - Repleting IV KCl as needed until PEG is in palce. - Goal to keep even.  GASTROINTESTINAL No results found for this basename: AST, ALT,  ALKPHOS, BILITOT, PROT, ALBUMIN,  in the last 168 hours A:  1) GERD 2) Diarrhea - resolved. C. Diff negative. 3) Colonic Ileus > resolved 4) Paraesophageal nodes - need FU CT in 3-4 mnths P: - Continue TF, Pepcid. - Minimize narcotics. - Failed Swallow eval again 1/31, NGT ordered but deferred 2/2 since he has pulled 2 GT already. Family confirmed desire for PEG placement, IR called 2/5 and will evaluate today, WBC elevation is due to stress and steroids, not evidence of infection.  PEG 2/10.  HEMATOLOGIC  Recent Labs Lab 05/18/12 0500 05/19/12 0511  WBC 15.9* 16.7*  HGB 14.7 14.6  HCT 44.9 44.3  MCV 91.4 91.3  PLT 133* 138*   A:  1) Leukocytosis -  no fever. 3) Anticoagulation in setting of atrial fibrillation - New diagnosis. CHADS 3. Coumadin on home. On heparin gtt. 4) Bilateral PE 5) Bilateral DVT - IVC filter placement 1/27 P:  - SCD for DVT prophlaxis. - No coumadin for now until PEG is in place.  After insertion will start heparin/coumadin in AM.  INFECTIOUS  Recent Labs Lab 05/18/12 0500 05/19/12 0511  WBC 15.9* 16.7*   A: 1) Flu pneumonia - Flu A and metapneumovirus > finished Tamiflu x 10d, droplet isolation d/c 1/18 2) Aspirated at time of intubation, pneumonitis vs PNA> finished Rocephin x8d. Aspirated during reintubation.  3) Nosocomial infection - Left IJ (in 8d) dc'ed 1/13 and cath tip cultures>staph/coag neg > tx finished Vanc  4) Diarrhea - resolved. 5) Leukocytosis - likely 2/2 steroids. Ceftaz 1/18 --> 1/29 w/ sputum cx negative. Procalcitonin 0.14 --> 0.15. WBC rising with low grade fever. P: Recultured 2/5 with enterococcous in urine, ceftaz started 2/6, will continue for now.  ENDOCRINE  Recent Labs Lab 05/18/12 1625 05/18/12 2016 05/18/12 2358 05/19/12 0444 05/19/12 0811  GLUCAP 193* 120* 130* 82 118*   A:  1) Diabetes mellitus, type 2 (A1c 7.1) - possible iatrogenic secondary to prolonged steroid use. 2) On steroids - CBGs at  goal. Lantus off. P: ICU Hyperglycemic protocol  NEUROLOGIC / PSYCHIATRIC   A:  1) Delerium - persistent, controlled on scheduled ativan, haldol and fentanyl, prn versed  P:  - Again, goal to stay off sedating drips - Continue scheduled haldol and decrease PRN fentanyl for now given persistent ileus. - Swallow study - when delirium is better under control but for now proceed with the PEG placement, will need to call surgery on Monday to place IR unable to. - Monitor QTc - haldol 5 q6 standing - Does not qualify for LTAC, SNF/trach beds [place] (possibly out of state) awaiting input from case  management. - PT/ OT consult. - IV Ativan. - CIR eval - good candidate once acute issues resolved primarily delirium.  GLOBAL:  IR was unable to place PEG, surgery to place PEG today, otherwise maintain current treatment.  Alyson Reedy, M.D. [facility] Pulmonary/Critical Care Medicine. Pager: [phone]. After hours pager: [phone].

## 2012-05-19 NOTE — Brief Op Note (Signed)
04/12/2012 - 05/19/2012  11:24 AM  PATIENT:  Cody [name], 62 y.o., male, MRN: [id number]  PREOP DIAGNOSIS:  Dysphagia, failed swallow test  POSTOP DIAGNOSIS:   Dysphagia, failed swallow test  PROCEDURE:   Procedure(s): PEG - push technique.  Upper endoscopy.  SURGEON:   Ovidio Kin, M.D.  ANESTHESIA:   IV sedation  Anesthesiologist: Judie Petit, MD CRNA: Jeani Hawking, CRNA  General  EBL:  minimal  ml  LOCAL MEDICATIONS USED:   10 cc 2% xylocaine with epi.  SPECIMEN:   None  COUNTS CORRECT:  YES  INDICATIONS FOR PROCEDURE:  Cody [name] is a 62 y.o. (DOB: Oct 09, 1950) white  male whose primary care physician is Judie Petit, MD and comes for PEG placement.  The patient had influenza, respiratory failure, now has a trach.  I discussed the planned surgery with his son.   The indications and risks of the surgery were explained to the patient.  The risks include, but are not limited to, infection, bleeding, and nerve injury.  Op Note: Patient was placed in supine position in room #1 at [facility].  He was given IV sedation.  A time out was held and the surgical checklist reviewed.  I passed the Pentax endoscope down the back of his throat without difficulty.  He GE junction is at 40 cm.  I identified a spot on the anterior stomach above the pylorus where the endoscope transilluminated the skin.  I prepped the left upper quadrant of the abdomen, infiltrated 10 cc of 2% xylocaine, made a 1 cm incision, and passed the 14 gauge needle into the anterior wall of the stomach.  The needle was visualized with the endoscope.  A guide wire was passed through the plastic catheter and grabbed with the snare through the endoscope.  The guidewire was brought out through the mouth and the "push" PEG tub was passed without difficult through the mouth.  It was seated at about 5 to 6 cm.  I re-endoscoped the patient and I identified the PEG tube.  It looked well placed.  Photos  were taken.  There was no bleeding.  The PEG was secured with the attachment device and sterilely dressed.  We will keep the patient NPO for 24 to 48 hours before using the tube.  Ovidio Kin, MD, [facility] Surgery Pager: [phone] Office phone:  [phone]

## 2012-05-19 NOTE — Plan of Care (Signed)
Problem: Phase I Progression Outcomes Goal: Progressing towards optiumm acitivities Outcome: Progressing Able to accomplish dependent range of motion only today d/t decreased loc.

## 2012-05-19 NOTE — Preoperative (Signed)
Beta Blockers   Reason not to administer Beta Blockers:Not Applicable 

## 2012-05-19 NOTE — Progress Notes (Signed)
CIR Admissions follow-up visit with patient. PEG placed today. At this point patient would not be able to tolerate the intensity of CIR therefore recommending SNF at this time. Please request follow-up if patient becomes more responsive and showing progress with therapies.  Please call [phone] for questions.

## 2012-05-19 NOTE — Progress Notes (Signed)
OT Cancellation Note  Patient Details Name: Cody [name] MRN: [id number] DOB: [birth year]   Cancelled Treatment:    Reason Eval/Treat Not Completed: Medical issues which prohibited therapy (peg placement)  [facility] Vester Titsworth, OTR/L  [phone] 05/19/2012 05/19/2012, 5:33 PM

## 2012-05-19 NOTE — Anesthesia Postprocedure Evaluation (Signed)
  Anesthesia Post-op Note  Patient: Cody [name]  Procedure(s) Performed: Procedure(s): PEG Possible Open Gastrostomy (N/A)  Patient Location: PACU  Anesthesia Type:General  Level of Consciousness: awake  Airway and Oxygen Therapy: Patient Spontanous Breathing  Post-op Pain: mild  Post-op Assessment: Post-op Vital signs reviewed  Post-op Vital Signs: Reviewed  Complications: No apparent anesthesia complications

## 2012-05-20 ENCOUNTER — Inpatient Hospital Stay (HOSPITAL_COMMUNITY): Payer: Medicaid Other

## 2012-05-20 LAB — CBC
MCHC: 33.6 g/dL (ref 30.0–36.0)
Platelets: 146 10*3/uL — ABNORMAL LOW (ref 150–400)
RDW: 16.4 % — ABNORMAL HIGH (ref 11.5–15.5)
WBC: 20.7 10*3/uL — ABNORMAL HIGH (ref 4.0–10.5)

## 2012-05-20 LAB — LIPASE, BLOOD: Lipase: 10 U/L — ABNORMAL LOW (ref 11–59)

## 2012-05-20 LAB — GLUCOSE, CAPILLARY
Glucose-Capillary: 102 mg/dL — ABNORMAL HIGH (ref 70–99)
Glucose-Capillary: 109 mg/dL — ABNORMAL HIGH (ref 70–99)
Glucose-Capillary: 109 mg/dL — ABNORMAL HIGH (ref 70–99)
Glucose-Capillary: 132 mg/dL — ABNORMAL HIGH (ref 70–99)
Glucose-Capillary: 95 mg/dL (ref 70–99)

## 2012-05-20 LAB — BASIC METABOLIC PANEL
Chloride: 99 mEq/L (ref 96–112)
GFR calc Af Amer: >90 mL/min (ref >90)
GFR calc non Af Amer: >90 mL/min (ref >90)
Potassium: 3.6 mEq/L (ref 3.5–5.1)

## 2012-05-20 LAB — MAGNESIUM: Magnesium: 2 mg/dL (ref 1.5–2.5)

## 2012-05-20 MED ORDER — POTASSIUM CHLORIDE 20 MEQ/15ML (10%) PO LIQD
40.0000 meq | Freq: Three times a day (TID) | ORAL | Status: AC
  Filled 2012-05-20 (×2): qty 30

## 2012-05-20 MED ORDER — SODIUM CHLORIDE 0.9 % IV BOLUS (SEPSIS)
1000.0000 mL | Freq: Once | INTRAVENOUS | Status: AC
  Administered 2012-05-20: 1000 mL via INTRAVENOUS

## 2012-05-20 NOTE — Progress Notes (Signed)
Pts. BP was low taken x2. Dr. Marchelle Gearing was notified with ordwers made. Also made aware of pts. blood work results.

## 2012-05-20 NOTE — Progress Notes (Signed)
eLink Physician-Brief Progress Note Patient Name: Cody [name] DOB: 1950/10/12 MRN: [id number]  Date of Service  05/20/2012   HPI/Events of Note   Peg placed but no post op orders and peg connected to drain. Po meds are cardizem and klonopin. Currently no issues. HR 100  eICU Interventions  Monitor with npo  Bedside team to assess after 7am 05/20/2012    Intervention Category Intermediate Interventions: Other:  Nello Corro 05/20/2012, 12:07 AM

## 2012-05-20 NOTE — Progress Notes (Addendum)
eLink Physician-Brief Progress Note Patient Name: Cody [name] DOB: Feb 17, 1951 MRN: [id number]  Date of Service  05/20/2012   HPI/Events of Note   RN says aptient have sudden episodes of diaphoressi but sugar is ok and temp is 84F and possibly pointing to abdomen  eICU Interventions  Check pct, lactate, lipase fent for abd pain   Intervention Category Intermediate Interventions: Abdominal pain - evaluation and management Minor Interventions: Other:  Jo-Ann Johanning 05/20/2012, 12:54 AM

## 2012-05-20 NOTE — Progress Notes (Signed)
PULMONARY  / CRITICAL CARE MEDICINE  Name: Cody [name] MRN: [id number] DOB: 1950-12-01    LOS: 38  REFERRING MD:   EDP - Dr. Preston Fleeting  CHIEF COMPLAINT:  Shortness of breath  BRIEF PATIENT DESCRIPTION: 13 yoM with COPD (3ppd smoking) admitted 1/5 with VDRF 2/2 influenza A and asp PNA, intubated 04/13/11 - 1/15 for AW protection, aspirated during intubation. Reintubated emergently 1/18 for severe delerium & resp distress, purulent secretions noted. Trach 1/22. New PE, DVT BL, s/p IVF filter 1/27. On hep gtt.   LINES / TUBES: ETT 1/5 >>1/15 , 1/18 >> self extubated 1/21 >>> reintubated 1/21 L IJ 1/5 >> 1/13 R IJ 1/13 >> 1/16 RIJ 1/18 >> 1/28 R A-line 1/5 >>1/8 pulled out by pt Trach 1/22 IVC Filter 1/27 >> PICC 1/28 >>  CULTURES: Blood culture 1/5>> Negative Resp culture 1/6>> H. influenzae  Resp viral panel 1/6>> Influenza A and Metapneumovirus  Sputum Leigonella Culture 1/6>> Negative Bronch Sputum 1/6>> H. influenzae  Flu 1/5>> Negative Blood culture 01/13 >> Negative Catheter tip culture 1/13 >>Staph -coag neg-70 colonies(vanc sens)  C.diff PCR 1/14 >> Negative Resp culture 1/18 >>> Negative Blood culture 2/6>>>NTD Urine 2/6>>>Enterococcous.  ANTIBIOTICS: Zosyn 1/5>>1/9 - restarted 1/22 >>1/28 Azithro 1/5>>1/8  Vanc 1/6>>>1/8 - restarted 1/13 >>1/25 Tamiflu 1/6>>>1/16  Rocephin 1/9>>1/15 Ceftazidine 1/18 >>1/22>>>2/6>>>  SIGNIFICANT EVENTS:  1/5 Intubated in ED  1/5 Shock , levophed  1/6 ECHO - 55%, mod dil rv, PA pressures>>> not measured b/c no TR jet  1/6 Bronched- diffuse pus all lobes  1/7 increasing pCO2, increased RR on vent  1/8 Changed sedation to Precedex, more agitated than with versed  1/9 Changed sedation back to Versed, agitation greatly improved  1/10 scheduled haldol  1/12 Restarting precedex gtt due to persistent agitation 1/13 Febrile to 102.6 overnight. ? Catheter infection? DC'ed L IJ, placed new R IJ. 1/14 No fevers overnight, agitation  controlled on propofol (added 1/13), more interactive. 1/15 Had foul smelling stools overnight, therefore, C.diff PCR was sent by RN. 1/15 EXTUBATED, developed A.fib with RVR overnight. 1/16 Persistent episodes of desaturation., afib>started on cardizem drip  1/17 increased agitation  1/18 reintubated  1/20 Persistent A. Fib this morning on dilt drip.  Rates controlled between 99-125 1/21 Self extubated, given a chance and continued to be in respiratory distress so reintubated.  1/22 Tracheostomy, complicated by bleeding, angio edema from protamine 1/23 Continued agitation. 1/23 Neg acute ct head 1/24 PEG placement planned --> delayed bc of ileus 1/25 CTA showing PE BL --> started on heparin gtt. 1/26 BL LE DVT with mobile thrombus 1/27 IVC Filter placement. 1/28 PICC Line 1/29 Persistent ileus per abdominal x-ray. Attempting to limit narcotics. And 1/30 Tolerated PMSV placement for 15 minutes and fatigue and   LEVEL OF CARE:  ICU PRIMARY SERVICE:  PCCM CONSULTANTS:  None CODE STATUS: Full DIET:  TF on hold as repeatedly pulling tubes out. DVT Px:  Heparin gtt GI Px:  Protonix  SUBJECTIVE / INTERVAL HISTORY:  Remains confused, bleeding overnight from trach site.  Interval Events: - Doing TC 24x7 - difficulty with PMV - failed FEES 1/31 - dilt gtt at 10 - request made for NGT per IR 2/1 pm  VITAL SIGNS: Temp:  [97.4 F (36.3 C)-101.4 F (38.6 C)] 98 F (36.7 C) (02/12 0822) Pulse Rate:  [89-114] 100 (02/12 0822) Resp:  [19-37] 19 (02/12 0822) BP: (81-140)/(49-103) 126/96 mmHg (02/12 0822) SpO2:  [90 %-100 %] 90 % (02/12 1126) FiO2 (%):  [  28 %-35 %] 35 % (02/12 1126) HEMODYNAMICS: VENTILATOR SETTINGS: Vent Mode:  [-]  FiO2 (%):  [28 %-35 %] 35 % INTAKE / OUTPUT:  Intake/Output Summary (Last 24 hours) at 05/20/12 1144 Last data filed at 05/20/12 0700  Gross per 24 hour  Intake    556 ml  Output   2250 ml  Net  -1694 ml   PHYSICAL EXAMINATION: General:  Tracheostomy in place, on ventilator, opens eyes to command and tracks around the room but not following any command.  Head: Normocephalic, Trach in place, no bleeding.  Lungs:  Diminished BS, coarse breath sounds clearing.Marland Kitchen  Heart: Irregularly irregular rhythm, regular rate.  Abdomen:  BS normoactive. Soft, Nondistended, non-tender.  No masses or organomegaly.  Extremities: No pretibial edema.    IMAGING:  1/30 - NG tube coiled overlying the proximal stomach.gaseous distention again noted.   1/25 - CTA chest - 1. Pulmonary embolus within segmental branches to the right upper lobe, left upper lobe and left lower lobe, extending distally. 2. Bilateral lower lobe airspace opacification is thought to reflect atelectasis, with trace bilateral pleural effusions. No definite evidence for aspiration. 3. Mild nonspecific left upper lobe opacities may reflect atelectasis.  1/26 - LE Dopplers - 1. Acute DVT right saphenofemoral junction, common femoral vein. 2. Acute DVT left saphenofemoral junction, left femoral vein, and mobile thrombus in the left common femoral vein.   DIAGNOSES: Principal Problem:   Acute respiratory failure with hypercapnia Active Problems:   HYPERLIPIDEMIA   TOBACCO USE   HYPERTENSION   GERD   COPD (chronic obstructive pulmonary disease)   Acute encephalopathy   Acute kidney injury   Atrial fibrillation   PE (pulmonary embolism)   Tracheostomy status  ASSESSMENT / PLAN:  PULMONARY A: 1) Acute hypercapnic respiratory failure/ARDS - secondary to flu, COPD exacerbation. Trach 1/22. 2) Aspiration - at time of intubation, finished course of treatment  3) Pneumonitis vs. CAP - b/l interstitial opacities on cxr, slightly increased at left, likely atelectasis. Completed 8/8 days rocephin, on tapering steroids  4) Pulmonary edema - improved. 4) Non-oxygen dependent COPD - h/o chronic tobacco abuse hx 3 pack per day 20+ years.  5) Bloody secretions from aspiration - Pulm  /renal syndrome ruled out. 6) Upper airway severe angioedema & rash from protamine  7) Likley small hematoma on rt apex from neck oozing  P:  - ATC as tolerated, 24x7, going for surgical PEG today, will likely come out on the vent and will need reweaning but will reexamine after patient is out of the OR. - Duonebs q6h + albuterol q2h PRN. - Continue steroids for rash - D/C solumedrol and decrease prednisone 20 mg PO daily. - Continue attempts PMSV, following command is the primary issue here that is obstructing that goal. - PEG today at noon and will attempt to restart heparin/coumadin after PEG is in place.  CARDIOVASCULAR  A:  1) New onset Afib with RVR - Unable to take oral amiodarone. On cardizem, amio. CE neg x 3. On heparin gtt. ? Back to NSR on 2/1 2) Hypotension - mild. With hx of HTN. 3) Anaphylaxis to protamine - protamine given in setting of trach placement, resultant anaphylactic reaction - resolved.  P:  - Goal HR < 120, cardizem gtt, currently at 10.-change to PO when able - Resume amio PO when able, may need amio IV if sustains AF - Continue PO dilt, 30 q6 hours PO today since NGT is in. - Anti-coagulation held for PE, DVT  and a-fib due to trach bleeding, hemoptysis and PEG placement.  Will likely restart heparin/coumadin once PEG is in place.  RENAL  Recent Labs Lab 05/19/12 0511 05/20/12 0138  NA 143 139  K 3.5 3.6  CL 104 99  CO2 32 33*  BUN 22 19  CREATININE 0.41* 0.61  GLUCOSE 95 111*  MG 2.2 2.0  PHOS 3.4 3.8   A:  1) Acute Kidney injury - Resolved. Previously likely pre-renal in setting of volume depletion. Previously urinary sediment clogging foley, improved. Continue to monitor in setting of lasix use. 2) Hypernatremia - resolved  3) Hypokalemia - Previously in setting of lasix no in setting of NPO.  P:  - Repleting IV KCl as needed until PEG is in palce. - Goal to keep even.  GASTROINTESTINAL No results found for this basename: AST, ALT,  ALKPHOS, BILITOT, PROT, ALBUMIN,  in the last 168 hours A:  1) GERD 2) Diarrhea - resolved. C. Diff negative. 3) Colonic Ileus > resolved 4) Paraesophageal nodes - need FU CT in 3-4 mnths P: - Continue TF, Pepcid. - Minimize narcotics. - Failed Swallow eval again 1/31, NGT ordered but deferred 2/2 since he has pulled 2 GT already. Family confirmed desire for PEG placement, IR called 2/5 and will evaluate today, WBC elevation is due to stress and steroids, not evidence of infection.  PEG 2/10.  HEMATOLOGIC  Recent Labs Lab 05/19/12 0511 05/20/12 0138  WBC 16.7* 20.7*  HGB 14.6 15.0  HCT 44.3 44.6  MCV 91.3 91.2  PLT 138* 146*   A:  1) Leukocytosis -  no fever. 3) Anticoagulation in setting of atrial fibrillation - New diagnosis. CHADS 3. Coumadin on home. On heparin gtt. 4) Bilateral PE 5) Bilateral DVT - IVC filter placement 1/27 P:  - SCD for DVT prophlaxis. - No coumadin for now until PEG is in place.  After insertion will start heparin/coumadin in AM.  INFECTIOUS  Recent Labs Lab 05/19/12 0511 05/20/12 0138  WBC 16.7* 20.7*   A: 1) Flu pneumonia - Flu A and metapneumovirus > finished Tamiflu x 10d, droplet isolation d/c 1/18 2) Aspirated at time of intubation, pneumonitis vs PNA> finished Rocephin x8d. Aspirated during reintubation.  3) Nosocomial infection - Left IJ (in 8d) dc'ed 1/13 and cath tip cultures>staph/coag neg > tx finished Vanc  4) Diarrhea - resolved. 5) Leukocytosis - likely 2/2 steroids. Ceftaz 1/18 --> 1/29 w/ sputum cx negative. Procalcitonin 0.14 --> 0.15. WBC rising with low grade fever. P: Recultured 2/5 with enterococcous in urine, ceftaz started 2/6, will continue for now.  ENDOCRINE  Recent Labs Lab 05/19/12 1713 05/19/12 2022 05/20/12 0015 05/20/12 0437 05/20/12 0820  GLUCAP 96 172* 102* 132* 95   A:  1) Diabetes mellitus, type 2 (A1c 7.1) - possible iatrogenic secondary to prolonged steroid use. 2) On steroids - CBGs at goal.  Lantus off. P: ICU Hyperglycemic protocol  NEUROLOGIC / PSYCHIATRIC   A:  1) Delerium - persistent, controlled on scheduled ativan, haldol and fentanyl, prn versed  P:  - Again, goal to stay off sedating drips - Continue scheduled haldol and decrease PRN fentanyl for now given persistent ileus. - Swallow study - when delirium is better under control but for now proceed with the PEG placement, will need to call surgery on Monday to place IR unable to. - Monitor QTc - haldol 5 q6 standing - Does not qualify for LTAC, SNF/trach beds requested (possibly out of state) awaiting input from case management. -  PT/ OT consult. - IV Ativan. - CIR eval - good candidate once acute issues resolved primarily delirium.  GLOBAL:  - PEG in place, will begin using it and will perform first trach change to a cuffless 6 then will be ready for transfer.  Social work to address discharge planning.  Alyson Reedy, M.D. [facility] Pulmonary/Critical Care Medicine. Pager: [phone]. After hours pager: [phone].

## 2012-05-20 NOTE — Progress Notes (Signed)
First trach change and downsize, patient positioned, area cleaned, sutures removed and size 8 cuffed changed to size 6 cuffless tracheostomy without complications, good color change.  Alyson Reedy, M.D. [facility] Pulmonary/Critical Care Medicine. Pager: [phone]. After hours pager: [phone].

## 2012-05-20 NOTE — Progress Notes (Signed)
Per direction from Gastroenterologist, G-tube is to be clamped with nothing instilled into it x 24 hours (until 1000am 05/21/10).

## 2012-05-20 NOTE — Progress Notes (Signed)
Occupational Therapy Treatment Patient Details Name: Cody [name] MRN: [id number] DOB: February 01, 1951 Today's Date: 05/20/2012 Time: 0865-7846 OT Time Calculation (min): 46 min  OT Assessment / Plan / Recommendation Comments on Treatment Session Good session today with increased participation and increased acitvity tolerance. Pt met several goals this session. Pt able to stand x3 with +2 total assist.  Pt also following one step commands consistently this session.  Feel that pt would still greatly benefit from CIR.    Follow Up Recommendations  CIR    Barriers to Discharge       Equipment Recommendations  3 in 1 bedside comode;Tub/shower bench;Wheelchair (measurements OT)    Recommendations for Other Services Rehab consult  Frequency Min 2X/week   Plan Discharge plan remains appropriate    Precautions / Restrictions Precautions Precautions: Fall Precaution Comments: trach, PEG Restrictions Weight Bearing Restrictions: No   Pertinent Vitals/Pain Trach collar; remained at or above 90% during activity; HR low 100s     ADL  Eating/Feeding: NPO Grooming: Performed;Brushing hair;Supervision/safety (vc for thoroughness) Where Assessed - Grooming: Unsupported sitting Upper Body Dressing: Performed;Maximal assistance Where Assessed - Upper Body Dressing: Supine, head of bed up Toilet Transfer: Simulated;+2 Total assistance Toilet Transfer: Patient Percentage: 50% Toilet Transfer Method: Sit to Barista:  (bed) Equipment Used: Gait belt;Rolling walker Transfers/Ambulation Related to ADLs: +2 total assist (pt= 50-60%) sit<>stand x3 using RW.   ADL Comments: Pt able to perform hand to mouth feeding pattern while sitting EOB at supervision level.  Able to consistently follow basic commands throughout session.      OT Diagnosis:    OT Problem List:   OT Treatment Interventions:     OT Goals ADL Goals Pt Will Perform Grooming: with min assist ADL Goal:  Grooming - Progress: Met Pt Will Transfer to Toilet: with max assist;3-in-1 ADL Goal: Toilet Transfer - Progress: Progressing toward goals Miscellaneous OT Goals Miscellaneous OT Goal #2: Pt will perform bed mobility with mod assist as precursor for EOB ADL retraining. OT Goal: Miscellaneous Goal #2 - Progress: Met Miscellaneous OT Goal #3: Pt will consistently follow one step commands during 75% of therapy session. OT Goal: Miscellaneous Goal #3 - Progress: Met  Visit Information  Last OT Received On: 05/20/12 Assistance Needed: +2 PT/OT Co-Evaluation/Treatment: Yes    Subjective Data      Prior Functioning       Cognition  Cognition Overall Cognitive Status: Impaired Area of Impairment: Attention;Following commands;Safety/judgement;Problem solving Difficult to assess due to: Tracheostomy Arousal/Alertness: Awake/alert Orientation Level: Appears intact for tasks assessed Behavior During Session: [facility] for tasks performed Current Attention Level: Selective Following Commands: Follows one step commands consistently;Follows multi-step commands with increased time Safety/Judgement: Decreased awareness of need for assistance Safety/Judgement - Other Comments: slightly impulsive when sitting EOB, requiring vc to wait for PT/OT assist before attempting to stand Problem Solving: min-mod functional basic Cognition - Other Comments: much improved cognition today and participation, able to communicate with me by mouthing words and pointing with his hands; wanted an explaination about why he couldn't drink any water or ice chips and wanted to know why they put the feeding tube in his stomach; appeared to understand my explaination and nodded his head    Mobility  Bed Mobility Bed Mobility: Sit to Sidelying Left Rolling Right: 3: Mod assist Rolling Left: 3: Mod assist Left Sidelying to Sit: 3: Mod assist Sit to Sidelying Left: 4: Min assist Details for Bed Mobility Assistance: Min VC for  sequencing and  mod manual facilitation for follow through of task.  Increased time due to fatigue and generalized soreness. Assist to trunk and LEs in/out of bed. Transfers Transfers: Sit to Stand;Stand to Sit Sit to Stand: 1: +2 Total assist;From bed;With upper extremity assist Sit to Stand: Patient Percentage:  (50-60%) Stand to Sit: 1: +2 Total assist;To bed;With upper extremity assist Stand to Sit: Patient Percentage: 60% Details for Transfer Assistance: Pt performed sit<>stand x3.  Assist for lift off from bed and steadying during transition of UEs from bed to chair. Manual facilitation for tall uprigth posture and to prevent knee buckling.   Did not transfer to chair due to MD plans to change trach collar this AM.     Exercises     Balance Balance Balance Assessed: Yes Static Sitting Balance Static Sitting - Balance Support: Right upper extremity supported Static Sitting - Level of Assistance: 6: Modified independent (Device/Increase time) Static Sitting - Comment/# of Minutes: verbal cues for tall posture, pt able to use his arms in sitting today for some grooming sitting EOB (sat EOB at least 25 minutes in between standing trials with improved tolerance today); able to weight shift slightly so that he could increase his BOS when asked Static Standing Balance Static Standing - Balance Support: Bilateral upper extremity supported Static Standing - Level of Assistance: 1: +2 Total assist (60%) Static Standing - Comment/# of Minutes: stood x3 with 1st 2 trials for 45 seconds and second trial for 1 minute; stood with RW needing facilitation for stability and tall posture; good ability to achieve trunk and knee extension today, less fatigue today   End of Session OT - End of Session Equipment Utilized During Treatment: Gait belt (RW) Activity Tolerance: Patient tolerated treatment well Patient left: in bed;with call bell/phone within reach Nurse Communication: Mobility status  GO    05/20/2012 Cipriano Mile OTR/L Pager [phone] Office [phone]   Cipriano Mile 05/20/2012, 11:35 AM

## 2012-05-20 NOTE — Progress Notes (Signed)
Passy-Muir Speaking Valve - Treatment Patient Details  Name: Cody [name] MRN: [id number] Date of Birth: 28-Jan-1951  Today's Date: 05/20/2012 Time: 1100-1118 SLP Time Calculation (min): 18 min  Past Medical History:  Past Medical History  Diagnosis Date  . Stroke   . Hypertension   . Hyperlipidemia   . GERD (gastroesophageal reflux disease)   . COPD (chronic obstructive pulmonary disease)    Past Surgical History: History reviewed. No pertinent past surgical history.  Assessment / Plan / Recommendation Clinical Impression  Treatment focused on PMSV tolerance following trach downsize this am to a # 6 cuffless.  Pt. tolerated valve on average of 3 minute increments.  Oxygen saturations dipped to 88% consistently after 3 minutes wearing valve and pt. coughed valve off once and other times removed by SLP.  Copious secretions expectorated through trach with Sp02 raising to 94%.  Vocal intensity and intelligibility were mildly increased today likely facilitated by increased tracheal volume of air to upper airway with smaller trach.  Pt. should have increased vocal projection as strength improves and decreased secretions and hopeful repeat objective swallow assessment soon.  Pt. exhibiting progress.     Plan  Continue with current plan of care    Follow Up Recommendations  Inpatient Rehab    Pertinent Vitals/Pain none    SLP Goals Potential to Achieve Goals: Good Progress/Goals/Alternative treatment plan discussed with pt/caregiver and they: Agree SLP Goal #1: Pt will tolerate PMSV during waking hours, including therapies and throughout mealtimes. SLP Goal #1 - Progress: Progressing toward goal SLP Goal #2: Pt will demonstrate appropriate volume and breath support to facilitate effective communication. SLP Goal #2 - Progress: Progressing toward goal SLP Goal #3: Pt/family will partipate in education and training re: use, care and cleaning of PMSV, and return demonstration of this  education. SLP Goal #3 - Progress: Progressing toward goal   PMSV Trial  PMSV was placed for: 3 min increments Able to redirect subglottic air through upper airway: Yes Able to Attain Phonation: Yes Voice Quality: Low vocal intensity Able to Expectorate Secretions: Yes Level of Secretion Expectoration with PMSV: Tracheal Breath Support for Phonation: Mildly decreased Intelligibility: Intelligibility reduced Word: 25-49% accurate Phrase: 0-24% accurate Respirations During Trial: 20 SpO2 During Trial: 92 % Pulse During Trial: 85 Behavior: Alert   Tracheostomy Tube  Additional Tracheostomy Tube Assessment Level of Secretion Expectoration: Tracheal    Vent Dependency  FiO2 (%): 35 %    Cuff Deflation Trial       Tolerated Cuff Deflation:  (N/A) Behavior: Alert;Cooperative   Breck Coons [place].Ed [employer] [phone]  05/20/2012

## 2012-05-20 NOTE — Progress Notes (Signed)
eLink Physician-Brief Progress Note Patient Name: Cody [name] DOB: September 13, 1950 MRN: [id number]  Date of Service  05/20/2012   HPI/Events of Note   Agitated. RN unable to give scheduled haldol due to qtc  eICU Interventions  Instructed to give 6.30am scheduled 1mg  ativan 5:10 AM    Intervention Category Major Interventions: Other: (peg issues) Intermediate Interventions: Abdominal pain - evaluation and management Minor Interventions: Other:  Madisson Kulaga 05/20/2012, 5:09 AM

## 2012-05-20 NOTE — Progress Notes (Signed)
Pts. Has episode of on and off  Bouts of  Diaphoresis,  Pt. Agitated very uncomfortable  Pointing to his adbomen appears in pain given fentanyl . IV. Dr. Marchelle Gearing made aware of pts. status with order to draw am labs now. Will cont. to monitor pt.

## 2012-05-20 NOTE — Progress Notes (Signed)
Physical Therapy Treatment Patient Details Name: Cody [name] MRN: [id number] DOB: 05-15-1950 Today's Date: 05/20/2012 Time: 4098-1191 PT Time Calculation (min): 52 min  PT Assessment / Plan / Recommendation Comments on Treatment Session  Adm. SOB and resp failure dur to flu and PNA with VDRF 1/5-1/15, 1/18-1/21 with trach and bronchoscopy 1/22. Making great progress today with therapy. Tolerating 52 minutes of therapy today sitting EOB, performing exercises and stood x3 for at least 45 seconds to a minute with much improved participation. He met several goals today specifically with bed mobility and so these have been updated accordingly.  I still feel very strongly he would make a great CIR candidate and called Jola Babinski Astronomer) to discuss his progress today.    Follow Up Recommendations  CIR     Does the patient have the potential to tolerate intense rehabilitation     Barriers to Discharge        Equipment Recommendations  Rolling walker with 5" wheels    Recommendations for Other Services    Frequency Min 3X/week   Plan Discharge plan remains appropriate;Frequency remains appropriate    Precautions / Restrictions Precautions Precautions: Fall Precaution Comments: trach, PEG Restrictions Weight Bearing Restrictions: No   Pertinent Vitals/Pain Trach collar; remained at or above 90% during activity; HR low 100s    Mobility  Bed Mobility Bed Mobility: Sit to Sidelying Left Rolling Right: 3: Mod assist Rolling Left: 3: Mod assist Left Sidelying to Sit: 3: Mod assist Sit to Sidelying Left: 4: Min assist Details for Bed Mobility Assistance: min verbal sequencing cues with mod facilitation for follow through; pt needing increased time due to stiffness in joints and some pain in his abdomen from the new PEG placed; minA to elevate legs to bed surface with sit->sidelying Transfers Transfers: Sit to Stand;Stand to Sit (x3) Sit to Stand: 1: +2 Total assist;With upper  extremity assist;From bed;From elevated surface Sit to Stand: Patient Percentage:  (50-60%) Stand to Sit: 1: +2 Total assist;With upper extremity assist;To bed (3rd trial pt needing only 40% assist from therapists with much improved strength and sequencing) Stand to Sit: Patient Percentage: 60% Details for Transfer Assistance: sit<>stand x3 for strengthening and progression of activity tolerance; pt much more independent with standing today using RW to stabilize once in standing needing facilitation for tall posture and assist to steady; did not transfer to the chair only because MD plans to change his trach collar this morning and needs him in the bed for this procedure Ambulation/Gait Ambulation/Gait Assistance: Not tested (comment)    Exercises General Exercises - Lower Extremity Straight Leg Raises: AAROM;Both;10 reps;Supine LAQ: AAROM: Both; 5 reps; seated    PT Goals Acute Rehab PT Goals PT Goal Formulation: With patient Time For Goal Achievement: 05/27/12 Potential to Achieve Goals: Good Pt will Roll Supine to Right Side: with supervision PT Goal: Rolling Supine to Right Side - Progress: Updated due to goal met Pt will Roll Supine to Left Side: with supervision PT Goal: Rolling Supine to Left Side - Progress: Updated due to goal met Pt will go Supine/Side to Sit: with supervision PT Goal: Supine/Side to Sit - Progress: Updated due to goal met Pt will Sit at [facility] of Bed: Independently;with no upper extremity support;6-10 min PT Goal: Sit at Edge Of Bed - Progress: Updated due to goal met (participating in upper extremity exercises) Pt will go Sit to Supine/Side: with modified independence PT Goal: Sit to Supine/Side - Progress: Updated due to goal met Pt will  go Sit to Stand: with upper extremity assist;with max assist PT Goal: Sit to Stand - Progress: Updated due to goal met Pt will go Stand to Sit: with max assist PT Goal: Stand to Sit - Progress: Updated due to goals met Pt  will Stand: with mod assist;3 - 5 min;with bilateral upper extremity support PT Goal: Stand - Progress: Goal set today Pt will Perform Home Exercise Program: with supervision, verbal cues required/provided PT Goal: Perform Home Exercise Program - Progress: Goal set today  Visit Information  Last PT Received On: 05/20/12 Assistance Needed: +2    Subjective Data  Subjective: wants water, wanted to know why his stomach hurt and why they put the tube in his stomach Patient Stated Goal: pt pointing to the chair    Cognition  Cognition Overall Cognitive Status: Impaired Area of Impairment: Attention;Following commands;Safety/judgement;Problem solving Difficult to assess due to: Tracheostomy Arousal/Alertness: Awake/alert Orientation Level: Appears intact for tasks assessed Behavior During Session: [facility] for tasks performed Current Attention Level: Selective Following Commands: Follows one step commands consistently;Follows multi-step commands with increased time Safety/Judgement: Decreased awareness of need for assistance Safety/Judgement - Other Comments: slight impulsivity and needs reminders that he does need assistance with standing  Problem Solving: min-mod functional basic Cognition - Other Comments: much improved cognition today and participation, able to communicate with me by mouthing words and pointing with his hands; wanted an explaination about why he couldn't drink any water or ice chips and wanted to know why they put the feeding tube in his stomach; appeared to understand my explaination and nodded his head    Balance  Static Sitting Balance Static Sitting - Balance Support: Right upper extremity supported Static Sitting - Level of Assistance: 6: Modified independent (Device/Increase time) Static Sitting - Comment/# of Minutes: verbal cues for tall posture, pt able to use his arms in sitting today for some grooming sitting EOB (sat EOB at least 25 minutes in between standing  trials with improved tolerance today); able to weight shift slightly so that he could increase his BOS when asked Static Standing Balance Static Standing - Balance Support: Bilateral upper extremity supported Static Standing - Level of Assistance: 1: +2 Total assist (60%) Static Standing - Comment/# of Minutes: stood x3 with 1st 2 trials for 45 seconds and second trial for 1 minute; stood with RW needing facilitation for stability and tall posture; good ability to achieve trunk and knee extension today, less fatigue today  End of Session PT - End of Session Equipment Utilized During Treatment: Gait belt Activity Tolerance: Patient tolerated treatment well Patient left: in bed Nurse Communication: Mobility status   GP     [facility] HELEN 05/20/2012, 10:21 AM

## 2012-05-20 NOTE — Progress Notes (Signed)
NUTRITION FOLLOW UP  Intervention:   1.Please consult "TF per nutrition" once able to use PEG  2. RD will continue to follow    Nutrition Dx:   Inadequate oral intake, ongoing   Goal:   Intake to meet >/=90% estimated nutrition needs. Unmet   Monitor:   Use of PEG, weight trends, labs  Assessment:   Pt s/p PEG placement on 2/10. NG tube is out and no EN is infusing at this time. Per recent RN notes, GI has indicated PEG not to be used until 1000 on 2/13.  Please consult for EN per RD once pt ready to use PEG.   Height: Ht Readings from Last 1 Encounters:  09/19/10 5' 10.5" (1.791 m)    Weight Status:   Wt Readings from Last 1 Encounters:  05/18/12 188 lb 4.4 oz (85.4 kg)  trending down   Re-estimated needs:  Kcal: 2000-2200 kcal  Protein: 125-135 gm  Fluid: >/= 1.2 L daily   Skin: stage 1 pressure ulcer to buttocks   Diet Order: NPO   Intake/Output Summary (Last 24 hours) at 05/20/12 1251 Last data filed at 05/20/12 0700  Gross per 24 hour  Intake    556 ml  Output   2250 ml  Net  -1694 ml    Last BM: 2/11   Labs:   Recent Labs Lab 05/17/12 0457 05/19/12 0511 05/20/12 0138  NA 141 143 139  K 3.6 3.5 3.6  CL 102 104 99  CO2 30 32 33*  BUN 26* 22 19  CREATININE 0.45* 0.41* 0.61  CALCIUM 9.3 9.1 9.1  MG 2.1 2.2 2.0  PHOS 3.6 3.4 3.8  GLUCOSE 148* 95 111*    CBG (last 3)   Recent Labs  05/20/12 0015 05/20/12 0437 05/20/12 0820  GLUCAP 102* 132* 95    Scheduled Meds: . amiodarone  400 mg Oral Daily  . antiseptic oral rinse  15 mL Mouth Rinse QID  . cefTAZidime (FORTAZ)  IV  1 g Intravenous Q8H  . chlorhexidine  15 mL Mouth Rinse BID  . clonazePAM  1 mg Per Tube BID  . diltiazem  30 mg Per Tube Q6H  . famotidine (PEPCID) IV  20 mg Intravenous Daily  . feeding supplement  30 mL Per Tube TID  . haloperidol lactate  5 mg Intravenous Q6H  . insulin aspart  0-15 Units Subcutaneous Q4H  . ipratropium  0.5 mg Nebulization Q6H  .  levalbuterol  0.63 mg Nebulization Q6H  . multivitamin  5 mL Per Tube Daily  . potassium chloride  40 mEq Per Tube TID  . predniSONE  20 mg Per Tube Daily  . sodium chloride  10-40 mL Intracatheter Q12H    Continuous Infusions: . sodium chloride 10 mL/hr at 05/19/12 1359  . dextrose 5 % and 0.45% NaCl 20 mL/hr at 05/19/12 1311  . feeding supplement (JEVITY 1.2 CAL) 1,000 mL (05/17/12 0827)  . lactated ringers 20 mL/hr at 05/19/12 0956    Clarene Duke RD, LDN Pager [phone] After Hours pager [phone]

## 2012-05-20 NOTE — Progress Notes (Signed)
ANTIBIOTIC CONSULT NOTE - FOLLOW UP  Pharmacy Consult for Ceftazidime Indication: Enterobacter UTI  Allergies  Allergen Reactions  . Protamine Anaphylaxis    Patient Measurements: Height: 5\' 10"  (177.8 cm) Weight: 188 lb 4.4 oz (85.4 kg) IBW/kg (Calculated) : 73  Vital Signs: Temp: 98 F (36.7 C) (02/12 0822) Temp src: Oral (02/12 0822) BP: 126/96 mmHg (02/12 0822) Pulse Rate: 100 (02/12 0822) Intake/Output from previous day: 02/11 0701 - 02/12 0700 In: 856 [I.V.:656; IV Piggyback:200] Out: 2265 [Urine:1850; Drains:100; Stool:300; Blood:15] Intake/Output from this shift:    Labs:  Recent Labs  05/18/12 0500 05/19/12 0511 05/20/12 0138  WBC 15.9* 16.7* 20.7*  HGB 14.7 14.6 15.0  PLT 133* 138* 146*  CREATININE  --  0.41* 0.61   Estimated Creatinine Clearance: 97.6 ml/min (by C-G formula based on Cr of 0.61). No results found for this basename: VANCOTROUGH, VANCOPEAK, VANCORANDOM, GENTTROUGH, GENTPEAK, GENTRANDOM, TOBRATROUGH, TOBRAPEAK, TOBRARND, AMIKACINPEAK, AMIKACINTROU, AMIKACIN,  in the last 72 hours   Microbiology: 2/5: Urine culture: >100k Enterobacter aerogenes R to cefazolin  Assessment: Cody [name] is a 47 YOM on day 6 of ceftazidime for enterobacter UTI. He has been febrile overnight with temp to 101.4 and wbc count trending up to 20. His renal function remains adequate with an estimated crcl of 100 ml/min. With trending wbc and fevers there was concern for cdiff which returned negative. No dose adjustments warranted, will continue to follow plan.  Plan:  Continue ceftazidime at 1g IV q8h  Thank you,  Sheppard Coil, PharmD, BCPS 05/20/2012 8:38 AM

## 2012-05-21 ENCOUNTER — Inpatient Hospital Stay (HOSPITAL_COMMUNITY): Payer: Medicaid Other

## 2012-05-21 ENCOUNTER — Encounter (HOSPITAL_COMMUNITY): Payer: Self-pay | Admitting: Surgery

## 2012-05-21 LAB — BASIC METABOLIC PANEL
BUN: 17 mg/dL (ref 6–23)
Calcium: 9.5 mg/dL (ref 8.4–10.5)
Chloride: 101 mEq/L (ref 96–112)
Creatinine, Ser: 0.51 mg/dL (ref 0.50–1.35)
GFR calc Af Amer: >90 mL/min (ref >90)
GFR calc non Af Amer: >90 mL/min (ref >90)

## 2012-05-21 LAB — CBC
MCH: 29.8 pg (ref 26.0–34.0)
MCV: 90.6 fL (ref 78.0–100.0)
Platelets: 177 10*3/uL (ref 150–400)
RBC: 4.77 MIL/uL (ref 4.22–5.81)
RDW: 16.4 % — ABNORMAL HIGH (ref 11.5–15.5)
WBC: 16 10*3/uL — ABNORMAL HIGH (ref 4.0–10.5)

## 2012-05-21 MED ORDER — HALOPERIDOL LACTATE 5 MG/ML IJ SOLN
5.0000 mg | Freq: Four times a day (QID) | INTRAMUSCULAR | Status: DC
  Administered 2012-05-21 – 2012-05-23 (×7): 5 mg via INTRAVENOUS
  Filled 2012-05-21 (×9): qty 1

## 2012-05-21 MED ORDER — WARFARIN SODIUM 7.5 MG PO TABS
7.5000 mg | ORAL_TABLET | Freq: Once | ORAL | Status: AC
  Administered 2012-05-21: 7.5 mg via ORAL
  Filled 2012-05-21: qty 1

## 2012-05-21 MED ORDER — JEVITY 1.2 CAL PO LIQD
1000.0000 mL | ORAL | Status: DC
  Administered 2012-05-21 – 2012-05-22 (×2): 1000 mL
  Filled 2012-05-21 (×2): qty 1000

## 2012-05-21 MED ORDER — FUROSEMIDE 10 MG/ML IJ SOLN
40.0000 mg | Freq: Once | INTRAMUSCULAR | Status: AC
  Administered 2012-05-21: 40 mg via INTRAVENOUS
  Filled 2012-05-21: qty 4

## 2012-05-21 MED ORDER — HALOPERIDOL LACTATE 5 MG/ML IJ SOLN
5.0000 mg | Freq: Once | INTRAMUSCULAR | Status: AC
  Administered 2012-05-21: 5 mg via INTRAVENOUS

## 2012-05-21 MED ORDER — WARFARIN - PHARMACIST DOSING INPATIENT
Freq: Every day | Status: DC
  Administered 2012-05-21 – 2012-05-27 (×5)

## 2012-05-21 MED ORDER — POTASSIUM CHLORIDE 20 MEQ/15ML (10%) PO LIQD
40.0000 meq | Freq: Three times a day (TID) | ORAL | Status: AC
  Administered 2012-05-21 (×2): 40 meq
  Filled 2012-05-21 (×2): qty 30

## 2012-05-21 NOTE — Progress Notes (Signed)
Passy-Muir Speaking Valve - Treatment Patient Details  Name: Cody [name] MRN: [id number] Date of Birth: November 11, 1950  Today's Date: 05/21/2012 Time: 1350-1415 SLP Time Calculation (min): 25 min  Past Medical History:  Past Medical History  Diagnosis Date  . Stroke   . Hypertension   . Hyperlipidemia   . GERD (gastroesophageal reflux disease)   . COPD (chronic obstructive pulmonary disease)    Past Surgical History:  Past Surgical History  Procedure Laterality Date  . Gastrostomy N/A 05/19/2012    Procedure: PEG Possible Open Gastrostomy;  Surgeon: Kandis Cocking, MD;  Location: [facility] OR;  Service: General;  Laterality: N/A;    Assessment / Plan / Recommendation Clinical Impression  Pt. alert, in bed mouthing consecutive words.  PMSV donned after pt. expectorated mucous from trach.  Pt. exhibited mildly improved respiratory support resulting in slightly increased volume and intelligibility for 1-2 words.  Spo2 fluctuated from 88%-93 not neccesarily in relation to speaking valve.  He requires total verbal/visual cues for deep breath and state only 1-2 words but pt. was unable due to cognitive impairments (internally distracted, requesting water).  Dysphagia treatment not attempted today due to decreased ability to sustain attention to task.  Pt. has great potential and has deemonstrated significant improvements.  When pt. able to wear valve consistently for 30 min periods, would then recommend repeat swallow assessment.       Plan  Continue with current plan of care    Follow Up Recommendations  Inpatient Rehab    Pertinent Vitals/Pain No indications   SLP Goals Potential to Achieve Goals: Good Potential Considerations: Severity of impairments SLP Goal #1: Pt will tolerate PMSV during waking hours, including therapies and throughout mealtimes. SLP Goal #1 - Progress: Progressing toward goal SLP Goal #2: Pt will demonstrate appropriate volume and breath support to facilitate  effective communication. SLP Goal #2 - Progress: Progressing toward goal   PMSV Trial  PMSV was placed for: 15 min Able to redirect subglottic air through upper airway: Yes Able to Attain Phonation: Yes Voice Quality: Hoarse;Low vocal intensity Able to Expectorate Secretions: Yes Level of Secretion Expectoration with PMSV: Tracheal Breath Support for Phonation: Severely decreased Intelligibility: Intelligibility reduced Word: 50-74% accurate Phrase: 0-24% accurate Respirations During Trial:  (WDL) SpO2 During Trial: 90 % Pulse During Trial:  (WDL) Behavior: Alert;Confused   Tracheostomy Tube  Additional Tracheostomy Tube Assessment Level of Secretion Expectoration: Tracheal    Vent Dependency  FiO2 (%): 50 %    Cuff Deflation Trial  GO     Tolerated Cuff Deflation:  (cuffless trach) Behavior: Alert;Confused   Royce Macadamia M.Ed [employer] [phone]  05/21/2012

## 2012-05-21 NOTE — Progress Notes (Signed)
Patient O2 sat at 87. Suctioned small amount of thick yellow secretions with no change. Increased FIO2 to 50% on trach collar. O2 sats now at 92. RT will continue to monitor.

## 2012-05-21 NOTE — Progress Notes (Signed)
Brief Nutrition Note  RD received call from RN that pt to start trickle feeds 10-68mL as tolerated per PA orders. RD will place TF orders per previous RD notes, pt was previously on Jevity 1.2. Will initiate Jevity 1.2 via PEG at 58ml/hr increase as tolerated to 61ml/hr and stay that that rate. For RD to manage TF please consult "TF per nutrition".   Levon Hedger MS, RD, LDN [phone] Pager [phone] After Hours Pager

## 2012-05-21 NOTE — Progress Notes (Signed)
PULMONARY  / CRITICAL CARE MEDICINE  Name: Cody [name] MRN: [id number] DOB: 19-Aug-1950    LOS: 39  REFERRING MD:   EDP - Dr. Preston Fleeting  CHIEF COMPLAINT:  Shortness of breath  BRIEF PATIENT DESCRIPTION: 31 yoM with COPD (3ppd smoking) admitted 1/5 with VDRF 2/2 influenza A and asp PNA, intubated 04/13/11 - 1/15 for AW protection, aspirated during intubation. Reintubated emergently 1/18 for severe delerium & resp distress, purulent secretions noted. Trach 1/22. New PE, DVT BL, s/p IVF filter 1/27. On hep gtt.   LINES / TUBES: ETT 1/5 >>1/15 , 1/18 >> self extubated 1/21 >>> reintubated 1/21 L IJ 1/5 >> 1/13 R IJ 1/13 >> 1/16 RIJ 1/18 >> 1/28 R A-line 1/5 >>1/8 pulled out by pt Trach 1/22 IVC Filter 1/27 >> PICC 1/28 >>  CULTURES: Blood culture 1/5>> Negative Resp culture 1/6>> H. influenzae  Resp viral panel 1/6>> Influenza A and Metapneumovirus  Sputum Leigonella Culture 1/6>> Negative Bronch Sputum 1/6>> H. influenzae  Flu 1/5>> Negative Blood culture 01/13 >> Negative Catheter tip culture 1/13 >>Staph -coag neg-70 colonies(vanc sens)  C.diff PCR 1/14 >> Negative Resp culture 1/18 >>> Negative Blood culture 2/6>>>NTD Urine 2/6>>>Enterococcous.  ANTIBIOTICS: Zosyn 1/5>>1/9 - restarted 1/22 >>1/28 Azithro 1/5>>1/8  Vanc 1/6>>>1/8 - restarted 1/13 >>1/25 Tamiflu 1/6>>>1/16  Rocephin 1/9>>1/15 Ceftazidine 1/18 >>1/22>>>2/6>>>  SIGNIFICANT EVENTS:  1/5 Intubated in ED  1/5 Shock , levophed  1/6 ECHO - 55%, mod dil rv, PA pressures>>> not measured b/c no TR jet  1/6 Bronched- diffuse pus all lobes  1/7 increasing pCO2, increased RR on vent  1/8 Changed sedation to Precedex, more agitated than with versed  1/9 Changed sedation back to Versed, agitation greatly improved  1/10 scheduled haldol  1/12 Restarting precedex gtt due to persistent agitation 1/13 Febrile to 102.6 overnight. ? Catheter infection? DC'ed L IJ, placed new R IJ. 1/14 No fevers overnight, agitation  controlled on propofol (added 1/13), more interactive. 1/15 Had foul smelling stools overnight, therefore, C.diff PCR was sent by RN. 1/15 EXTUBATED, developed A.fib with RVR overnight. 1/16 Persistent episodes of desaturation., afib>started on cardizem drip  1/17 increased agitation  1/18 reintubated  1/20 Persistent A. Fib this morning on dilt drip.  Rates controlled between 99-125 1/21 Self extubated, given a chance and continued to be in respiratory distress so reintubated.  1/22 Tracheostomy, complicated by bleeding, angio edema from protamine 1/23 Continued agitation. 1/23 Neg acute ct head 1/24 PEG placement planned --> delayed bc of ileus 1/25 CTA showing PE BL --> started on heparin gtt. 1/26 BL LE DVT with mobile thrombus 1/27 IVC Filter placement. 1/28 PICC Line 1/29 Persistent ileus per abdominal x-ray. Attempting to limit narcotics. And 1/30 Tolerated PMSV placement for 15 minutes and fatigue and   LEVEL OF CARE:  ICU PRIMARY SERVICE:  PCCM CONSULTANTS:  None CODE STATUS: Full DIET:  TF on hold as repeatedly pulling tubes out. DVT Px:  Heparin gtt GI Px:  Protonix  SUBJECTIVE / INTERVAL HISTORY:  Remains confused, increase in O2 demand overnight.  Interval Events: - Doing TC 24x7 - difficulty with PMV - failed FEES 1/31 - dilt gtt at 10 - request made for NGT per IR 2/1 pm  VITAL SIGNS: Temp:  [97.7 F (36.5 C)-98.9 F (37.2 C)] 98.9 F (37.2 C) (02/13 0810) Pulse Rate:  [92-108] 102 (02/13 0755) Resp:  [17-30] 17 (02/13 0755) BP: (106-170)/(70-106) 132/91 mmHg (02/13 0755) SpO2:  [88 %-96 %] 91 % (02/13 0755) FiO2 (%):  [  35 %-50 %] 50 % (02/13 0755) HEMODYNAMICS: VENTILATOR SETTINGS: Vent Mode:  [-]  FiO2 (%):  [35 %-50 %] 50 % INTAKE / OUTPUT:  Intake/Output Summary (Last 24 hours) at 05/21/12 1023 Last data filed at 05/21/12 0640  Gross per 24 hour  Intake    290 ml  Output    950 ml  Net   -660 ml   PHYSICAL EXAMINATION: General:  Tracheostomy in place, on ventilator, opens eyes to command and tracks around the room but not following any command.  Head: Normocephalic, Trach in place, no bleeding.  Lungs:  Diminished BS, coarse breath sounds clearing.Marland Kitchen  Heart: Irregularly irregular rhythm, regular rate.  Abdomen:  BS normoactive. Soft, Nondistended, non-tender.  No masses or organomegaly.  Extremities: No pretibial edema.    IMAGING:  1/30 - NG tube coiled overlying the proximal stomach.gaseous distention again noted.   1/25 - CTA chest - 1. Pulmonary embolus within segmental branches to the right upper lobe, left upper lobe and left lower lobe, extending distally. 2. Bilateral lower lobe airspace opacification is thought to reflect atelectasis, with trace bilateral pleural effusions. No definite evidence for aspiration. 3. Mild nonspecific left upper lobe opacities may reflect atelectasis.  1/26 - LE Dopplers - 1. Acute DVT right saphenofemoral junction, common femoral vein. 2. Acute DVT left saphenofemoral junction, left femoral vein, and mobile thrombus in the left common femoral vein.   DIAGNOSES: Principal Problem:   Acute respiratory failure with hypercapnia Active Problems:   HYPERLIPIDEMIA   TOBACCO USE   HYPERTENSION   GERD   COPD (chronic obstructive pulmonary disease)   Acute encephalopathy   Acute kidney injury   Atrial fibrillation   PE (pulmonary embolism)   Tracheostomy status  ASSESSMENT / PLAN:  PULMONARY A: 1) Acute hypercapnic respiratory failure/ARDS - secondary to flu, COPD exacerbation. Trach 1/22. 2) Aspiration - at time of intubation, finished course of treatment  3) Pneumonitis vs. CAP - b/l interstitial opacities on cxr, slightly increased at left, likely atelectasis. Completed 8/8 days rocephin, on tapering steroids  4) Pulmonary edema - improved. 4) Non-oxygen dependent COPD - h/o chronic tobacco abuse hx 3 pack per day 20+ years.  5) Bloody secretions from aspiration - Pulm  /renal syndrome ruled out. 6) Upper airway severe angioedema & rash from protamine  7) Likley small hematoma on rt apex from neck oozing  P:  - ATC as tolerated, 24x7, going for surgical PEG today, will likely come out on the vent and will need reweaning but will reexamine after patient is out of the OR. - Duonebs q6h + albuterol q2h PRN. - Continue steroids for rash - D/C solumedrol and decrease prednisone 20 mg PO daily. - CXR today for increase O2 demand, ?need for bronch for removal of secretion. - PEG in place, may start using it.  CARDIOVASCULAR  A:  1) New onset Afib with RVR - Unable to take oral amiodarone. On cardizem, amio. CE neg x 3. On heparin gtt. ? Back to NSR on 2/1 2) Hypotension - mild. With hx of HTN. 3) Anaphylaxis to protamine - protamine given in setting of trach placement, resultant anaphylactic reaction - resolved.  P:  - Goal HR < 120, cardizem gtt, currently at 10.-change to PO when able - Resume amio PO when able, may need amio IV if sustains AF - Continue PO dilt, 30 q6 hours PO today since NGT is in. - Restart coumadin for a-fib, DVT and PE.  RENAL  Recent Labs Lab  05/20/12 0138 05/21/12 0505  NA 139 144  K 3.6 3.3*  CL 99 101  CO2 33* 32  BUN 19 17  CREATININE 0.61 0.51  GLUCOSE 111* 112*  MG 2.0 2.0  PHOS 3.8 3.6   A:  1) Acute Kidney injury - Resolved. Previously likely pre-renal in setting of volume depletion. Previously urinary sediment clogging foley, improved. Continue to monitor in setting of lasix use. 2) Hypernatremia - resolved  3) Hypokalemia - Previously in setting of lasix no in setting of NPO.  P:  - Replace K and low dose diureses. - Goal to keep even. - Single dose of lasix today.  GASTROINTESTINAL No results found for this basename: AST, ALT, ALKPHOS, BILITOT, PROT, ALBUMIN,  in the last 168 hours A:  1) GERD 2) Diarrhea - resolved. C. Diff negative. 3) Colonic Ileus > resolved 4) Paraesophageal nodes - need FU CT  in 3-4 mnths P: - Continue TF, Pepcid. - Minimize narcotics. - PEG in place, start using.  HEMATOLOGIC  Recent Labs Lab 05/20/12 0138 05/21/12 0505  WBC 20.7* 16.0*  HGB 15.0 14.2  HCT 44.6 43.2  MCV 91.2 90.6  PLT 146* 177   A:  1) Leukocytosis -  no fever. 3) Anticoagulation in setting of atrial fibrillation - New diagnosis. CHADS 3. Coumadin on home. On heparin gtt. 4) Bilateral PE 5) Bilateral DVT - IVC filter placement 1/27 P:  - SCD for DVT prophlaxis. - Coumadin with no heparin given GI bleed.  INFECTIOUS  Recent Labs Lab 05/20/12 0138 05/21/12 0505  WBC 20.7* 16.0*   A: 1) Flu pneumonia - Flu A and metapneumovirus > finished Tamiflu x 10d, droplet isolation d/c 1/18 2) Aspirated at time of intubation, pneumonitis vs PNA> finished Rocephin x8d. Aspirated during reintubation.  3) Nosocomial infection - Left IJ (in 8d) dc'ed 1/13 and cath tip cultures>staph/coag neg > tx finished Vanc  4) Diarrhea - resolved. 5) Leukocytosis - likely 2/2 steroids. Ceftaz 1/18 --> 1/29 w/ sputum cx negative. Procalcitonin 0.14 --> 0.15. WBC rising with low grade fever. P: Recultured 2/5 with enterococcous in urine, ceftaz started 2/6, will continue for now.  ENDOCRINE  Recent Labs Lab 05/20/12 0820 05/20/12 1210 05/20/12 1710 05/20/12 1927 05/20/12 2336  GLUCAP 95 116* 110* 109* 109*   A:  1) Diabetes mellitus, type 2 (A1c 7.1) - possible iatrogenic secondary to prolonged steroid use. 2) On steroids - CBGs at goal. Lantus off. P: ICU Hyperglycemic protocol  NEUROLOGIC / PSYCHIATRIC   A:  1) Delerium - persistent, controlled on scheduled ativan, haldol and fentanyl, prn versed  P:  - Again, goal to stay off sedating drips - Continue scheduled haldol and decrease PRN fentanyl for now given persistent ileus. - Swallow study - when delirium is better under control but for now proceed with the PEG placement, will need to call surgery on Monday to place IR unable  to. - Monitor QTc - haldol 5 q6 standing - Does not qualify for LTAC, SNF/trach beds requested (possibly out of state) awaiting input from case management. - PT/ OT consult. - IV Ativan. - CIR eval - good candidate once acute issues resolved primarily delirium.  Alyson Reedy, M.D. [facility] Pulmonary/Critical Care Medicine. Pager: [phone]. After hours pager: [phone].

## 2012-05-21 NOTE — Progress Notes (Signed)
RT called by RN to room. Patient O2 sat 85-89 even after suction and trach care. Bag lavaged patient with 10cc ns. O2 sat currently 92% on 35% trach collar. Patient tolerated procedure well with no adverse effects.

## 2012-05-21 NOTE — Progress Notes (Signed)
2 Days Post-Op  Subjective: Pt was reportedly very restless last night only getting a few hours of sleep.  Pt not fully able to communicate.  Pt having some pain around PEG site, but no other complaints.  No vomiting noted by nursing.  Gtube has been clamped for 24 hours.  Pt having BM by flexi-seal.    Objective: Vital signs in last 24 hours: Temp:  [97.7 F (36.5 C)-98.6 F (37 C)] 98.4 F (36.9 C) (02/13 0456) Pulse Rate:  [92-108] 101 (02/13 0544) Resp:  [19-30] 28 (02/13 0544) BP: (106-150)/(70-106) 150/97 mmHg (02/13 0544) SpO2:  [88 %-96 %] 93 % (02/13 0729) FiO2 (%):  [35 %-50 %] 40 % (02/13 0729) Last BM Date: 05/19/12 (with flexi-seal)  Intake/Output from previous day: 02/12 0701 - 02/13 0700 In: 290 [I.V.:240; IV Piggyback:50] Out: 950 [Urine:750; Stool:200] Intake/Output this shift:    PE: Gen:  Alert, NAD, pleasant Abd: Soft, ND, minimal pain around PEG site, +BS, no HSM, PEG dressing C/D/I   Lab Results:   Recent Labs  05/20/12 0138 05/21/12 0505  WBC 20.7* 16.0*  HGB 15.0 14.2  HCT 44.6 43.2  PLT 146* 177   BMET  Recent Labs  05/20/12 0138 05/21/12 0505  NA 139 144  K 3.6 3.3*  CL 99 101  CO2 33* 32  GLUCOSE 111* 112*  BUN 19 17  CREATININE 0.61 0.51  CALCIUM 9.1 9.5   PT/INR No results found for this basename: LABPROT, INR,  in the last 72 hours CMP     Component Value Date/Time   NA 144 05/21/2012 0505   K 3.3* 05/21/2012 0505   CL 101 05/21/2012 0505   CO2 32 05/21/2012 0505   GLUCOSE 112* 05/21/2012 0505   BUN 17 05/21/2012 0505   CREATININE 0.51 05/21/2012 0505   CALCIUM 9.5 05/21/2012 0505   PROT 5.6* 05/05/2012 0420   ALBUMIN 2.3* 05/05/2012 0420   AST 24 05/05/2012 0420   ALT 33 05/05/2012 0420   ALKPHOS 72 05/05/2012 0420   BILITOT 0.9 05/05/2012 0420   GFRNONAA >90 05/21/2012 0505   GFRAA >90 05/21/2012 0505   Lipase     Component Value Date/Time   LIPASE 10* 05/20/2012 0138       Studies/Results: Dg Abd Portable  1v  05/20/2012  *RADIOLOGY REPORT*  Clinical Data: Abdominal pain.  Hypotension.  PORTABLE ABDOMEN - 1 VIEW  Comparison: Abdominal radiograph performed 05/15/2012  Findings: The visualized bowel gas pattern is unremarkable. Residual contrast is noted within the colon; no abnormal dilatation of small bowel loops is seen to suggest small bowel obstruction. No free intra-abdominal air is identified, though evaluation for free air is limited on a single supine view.  A G-tube is noted overlying the upper abdomen.  The visualized osseous structures are within normal limits; the sacroiliac joints are unremarkable in appearance.  An IVC filter is noted overlying expected position.  IMPRESSION: Unremarkable bowel gas pattern; no free intra-abdominal air seen.   Original Report Authenticated By: Tonia Ghent, M.D.     Anti-infectives: Anti-infectives   Start     Dose/Rate Route Frequency Ordered Stop   05/15/12 0000  ceFAZolin (ANCEF) IVPB 2 g/50 mL premix    Comments:  Hang ON CALL to IR 2/7   2 g 100 mL/hr over 30 Minutes Intravenous  Once 05/14/12 1230 05/15/12 0055   05/14/12 1800  cefTAZidime (FORTAZ) 1 g in dextrose 5 % 50 mL IVPB     1 g 100 mL/hr  over 30 Minutes Intravenous 3 times per day 05/14/12 1702     05/14/12 1645  cefTRIAXone (ROCEPHIN) 1 g in dextrose 5 % 50 mL IVPB  Status:  Discontinued     1 g 100 mL/hr over 30 Minutes Intravenous Every 24 hours 05/14/12 1632 05/14/12 1633   04/29/12 1400  piperacillin-tazobactam (ZOSYN) IVPB 3.375 g  Status:  Discontinued     3.375 g 12.5 mL/hr over 240 Minutes Intravenous 3 times per day 04/29/12 1051 05/06/12 1111   04/26/12 1946  vancomycin (VANCOCIN) IVPB 1000 mg/200 mL premix  Status:  Discontinued     1,000 mg 200 mL/hr over 60 Minutes Intravenous Every 8 hours 04/26/12 1429 05/02/12 1002   04/25/12 1200  cefTAZidime (FORTAZ) 1 g in dextrose 5 % 50 mL IVPB  Status:  Discontinued     1 g 100 mL/hr over 30 Minutes Intravenous 3 times per day  04/25/12 1106 04/29/12 1051   04/20/12 1215  vancomycin (VANCOCIN) IVPB 1000 mg/200 mL premix  Status:  Discontinued     1,000 mg 200 mL/hr over 60 Minutes Intravenous Every 12 hours 04/20/12 1153 04/26/12 1429   04/17/12 0000  cefTRIAXone (ROCEPHIN) 1 g in dextrose 5 % 50 mL IVPB  Status:  Discontinued     1 g 120 mL/hr over 30 Minutes Intravenous Every 24 hours 04/16/12 1516 04/22/12 1639   04/14/12 1100  vancomycin (VANCOCIN) IVPB 1000 mg/200 mL premix  Status:  Discontinued     1,000 mg 200 mL/hr over 60 Minutes Intravenous Every 12 hours 04/14/12 0821 04/15/12 1005   04/13/12 2300  vancomycin (VANCOCIN) 750 mg in sodium chloride 0.9 % 150 mL IVPB  Status:  Discontinued     750 mg 150 mL/hr over 60 Minutes Intravenous Every 12 hours 04/13/12 1347 04/14/12 0821   04/13/12 1400  oseltamivir (TAMIFLU) 6 MG/ML suspension 150 mg     150 mg Oral 2 times daily 04/13/12 1327 04/22/12 2210   04/13/12 1400  vancomycin (VANCOCIN) 1,500 mg in sodium chloride 0.9 % 500 mL IVPB     1,500 mg 250 mL/hr over 120 Minutes Intravenous  Once 04/13/12 1347 04/13/12 1706   04/13/12 0600  piperacillin-tazobactam (ZOSYN) IVPB 3.375 g     3.375 g 12.5 mL/hr over 240 Minutes Intravenous 3 times per day 04/13/12 0102 04/17/12 0101   04/12/12 2200  piperacillin-tazobactam (ZOSYN) IVPB 3.375 g     3.375 g 100 mL/hr over 30 Minutes Intravenous  Once 04/12/12 2148 04/12/12 2347   04/12/12 2145  azithromycin (ZITHROMAX) 500 mg in dextrose 5 % 250 mL IVPB  Status:  Discontinued     500 mg 250 mL/hr over 60 Minutes Intravenous Every 24 hours 04/12/12 2143 04/15/12 1005       Assessment/Plan S/p PEG tube placement on 05/18/12 1.  Pt tolerated  G Tube clamped will start trickle feeds 10-2mL TF as tolerated.  If patient doesn't tolerate TF then re-clamp G tube.  If patient tolerates TF will advance slowly tomorrow as recommended by dietitian 2.  Multiple other chronic medical conditions - CCM following   LOS: 39  days    DORT, Tyreak Reagle 05/21/2012, 7:43 AM Pager: [phone]

## 2012-05-21 NOTE — Progress Notes (Addendum)
ANTICOAGULATION CONSULT NOTE - Follow Up Consult  Pharmacy Consult for heparin Indication: DVT/PE  Patient has been off IV heparin for several days due to some initial issues of bleeding as well as for procedures.  He was started on IV heparin for new PE/DVT.  Today his H/H is stable as is his platelets and no noted bleeding.  PLAN: 1.  Await CCM decision as to timing of heparin restart.  Cody [name], PharmD., MS Clinical Pharmacist Pager:  [phone] Thank you for allowing pharmacy to be part of this patients care team.  Addendum: Spoke with Dr. Molli Knock, will start Warfarin therapy for treatment of DVT/PE.    Will give Warfarin 7.5mg  PO x 1 today Daily PT/INR Warfarin teaching video and book for patient and family.  Cody [name], PharmD., MS Clinical Pharmacist Pager:  [phone] Thank you for allowing pharmacy to be part of this patients care team.

## 2012-05-21 NOTE — Progress Notes (Signed)
General surgery attending note:  Patient examined. I agree with the assessment and treatment plan outlined by Ms. Dort, PA.  Abdomen soft. Bowel sounds present. PEG tube site clean and dry.  Begin trickle tube feeds today.   Angelia Mould. Derrell Lolling, M.D., [facility] General and Minimally invasive Surgery Breast and Colorectal Surgery Office:   [phone] Pager:   [phone]

## 2012-05-22 ENCOUNTER — Inpatient Hospital Stay (HOSPITAL_COMMUNITY): Payer: Medicaid Other

## 2012-05-22 LAB — BASIC METABOLIC PANEL
BUN: 16 mg/dL (ref 6–23)
Creatinine, Ser: 0.58 mg/dL (ref 0.50–1.35)
GFR calc Af Amer: >90 mL/min (ref >90)
GFR calc non Af Amer: >90 mL/min (ref >90)
Potassium: 3.1 mEq/L — ABNORMAL LOW (ref 3.5–5.1)

## 2012-05-22 LAB — CBC
MCH: 30 pg (ref 26.0–34.0)
Platelets: 193 10*3/uL (ref 150–400)
RBC: 4.9 MIL/uL (ref 4.22–5.81)

## 2012-05-22 LAB — PROTIME-INR: Prothrombin Time: 15.5 seconds — ABNORMAL HIGH (ref 11.6–15.2)

## 2012-05-22 LAB — GLUCOSE, CAPILLARY: Glucose-Capillary: 163 mg/dL — ABNORMAL HIGH (ref 70–99)

## 2012-05-22 MED ORDER — WARFARIN SODIUM 5 MG PO TABS
5.0000 mg | ORAL_TABLET | Freq: Once | ORAL | Status: AC
  Administered 2012-05-22: 5 mg via ORAL
  Filled 2012-05-22: qty 1

## 2012-05-22 MED ORDER — POTASSIUM CHLORIDE 20 MEQ/15ML (10%) PO LIQD
40.0000 meq | Freq: Three times a day (TID) | ORAL | Status: AC
  Administered 2012-05-22 (×3): 40 meq
  Filled 2012-05-22 (×3): qty 30

## 2012-05-22 MED ORDER — FUROSEMIDE 10 MG/ML IJ SOLN
40.0000 mg | Freq: Three times a day (TID) | INTRAMUSCULAR | Status: AC
  Administered 2012-05-22 (×2): 40 mg via INTRAVENOUS
  Filled 2012-05-22 (×2): qty 4

## 2012-05-22 NOTE — Progress Notes (Signed)
PULMONARY  / CRITICAL CARE MEDICINE  Name: Cody [name] MRN: [id number] DOB: 01-02-1951    LOS: 40  REFERRING MD:   EDP - Dr. Preston Fleeting  CHIEF COMPLAINT:  Shortness of breath  BRIEF PATIENT DESCRIPTION: 56 yoM with COPD (3ppd smoking) admitted 1/5 with VDRF 2/2 influenza A and asp PNA, intubated 04/13/11 - 1/15 for AW protection, aspirated during intubation. Reintubated emergently 1/18 for severe delerium & resp distress, purulent secretions noted. Trach 1/22. New PE, DVT BL, s/p IVF filter 1/27. On hep gtt.   LINES / TUBES: ETT 1/5 >>1/15 , 1/18 >> self extubated 1/21 >>> reintubated 1/21 L IJ 1/5 >> 1/13 R IJ 1/13 >> 1/16 RIJ 1/18 >> 1/28 R A-line 1/5 >>1/8 pulled out by pt Trach 1/22 IVC Filter 1/27 >> PICC 1/28 >>  CULTURES: Blood culture 1/5>> Negative Resp culture 1/6>> H. influenzae  Resp viral panel 1/6>> Influenza A and Metapneumovirus  Sputum Leigonella Culture 1/6>> Negative Bronch Sputum 1/6>> H. influenzae  Flu 1/5>> Negative Blood culture 01/13 >> Negative Catheter tip culture 1/13 >>Staph -coag neg-70 colonies(vanc sens)  C.diff PCR 1/14 >> Negative Resp culture 1/18 >>> Negative Blood culture 2/6>>>NTD Urine 2/6>>>Enterobacter aerogenes pan sensitive.  ANTIBIOTICS: Zosyn 1/5>>1/9 - restarted 1/22 >>1/28 Azithro 1/5>>1/8  Vanc 1/6>>>1/8 - restarted 1/13 >>1/14 Tamiflu 1/6>>>1/16  Rocephin 1/9>>1/15 Ceftazidine 1/18 >>1/22>>>2/6>>>  SIGNIFICANT EVENTS:  1/5 Intubated in ED  1/5 Shock , levophed  1/6 ECHO - 55%, mod dil rv, PA pressures>>> not measured b/c no TR jet  1/6 Bronched- diffuse pus all lobes  1/7 increasing pCO2, increased RR on vent  1/8 Changed sedation to Precedex, more agitated than with versed  1/9 Changed sedation back to Versed, agitation greatly improved  1/10 scheduled haldol  1/12 Restarting precedex gtt due to persistent agitation 1/13 Febrile to 102.6 overnight. ? Catheter infection? DC'ed L IJ, placed new R IJ. 1/14 No fevers  overnight, agitation controlled on propofol (added 1/13), more interactive. 1/15 Had foul smelling stools overnight, therefore, C.diff PCR was sent by RN. 1/15 EXTUBATED, developed A.fib with RVR overnight. 1/16 Persistent episodes of desaturation., afib>started on cardizem drip  1/17 increased agitation  1/18 reintubated  1/20 Persistent A. Fib this morning on dilt drip.  Rates controlled between 99-125 1/21 Self extubated, given a chance and continued to be in respiratory distress so reintubated.  1/22 Tracheostomy, complicated by bleeding, angio edema from protamine 1/23 Continued agitation. 1/23 Neg acute ct head 1/24 PEG placement planned --> delayed bc of ileus 1/25 CTA showing PE BL --> started on heparin gtt. 1/26 BL LE DVT with mobile thrombus 1/27 IVC Filter placement. 1/28 PICC Line 1/29 Persistent ileus per abdominal x-ray. Attempting to limit narcotics. And 1/30 Tolerated PMSV placement for 15 minutes and fatigue and   LEVEL OF CARE:  ICU PRIMARY SERVICE:  PCCM CONSULTANTS:  None CODE STATUS: Full DIET:  TF on hold as repeatedly pulling tubes out. DVT Px:  Heparin gtt GI Px:  Protonix  SUBJECTIVE / INTERVAL HISTORY:  Remains confused, increase in O2 demand overnight.  Interval Events: - Doing TC 24x7 - difficulty with PMV - failed FEES 1/31 - dilt gtt at 10 - request made for NGT per IR 2/1 pm  VITAL SIGNS: Temp:  [97.2 F (36.2 C)-97.8 F (36.6 C)] 97.4 F (36.3 C) (02/14 0800) Pulse Rate:  [84-113] 110 (02/14 0400) Resp:  [16-26] 26 (02/14 0800) BP: (110-146)/(70-93) 110/90 mmHg (02/14 0800) SpO2:  [90 %-98 %] 93 % (02/14 0800)  FiO2 (%):  [50 %] 50 % (02/14 0725) HEMODYNAMICS: VENTILATOR SETTINGS: Vent Mode:  [-]  FiO2 (%):  [50 %] 50 % INTAKE / OUTPUT:  Intake/Output Summary (Last 24 hours) at 05/22/12 1146 Last data filed at 05/22/12 0800  Gross per 24 hour  Intake    400 ml  Output   1875 ml  Net  -1475 ml   PHYSICAL EXAMINATION: General:  Tracheostomy in place, on ventilator, opens eyes to command and tracks around the room but not following any command.  Head: Normocephalic, Trach in place, no bleeding.  Lungs:  Diminished BS, coarse breath sounds clearing.Marland Kitchen  Heart: Irregularly irregular rhythm, regular rate.  Abdomen:  BS normoactive. Soft, Nondistended, non-tender.  No masses or organomegaly.  Extremities: No pretibial edema.    IMAGING:  1/30 - NG tube coiled overlying the proximal stomach.gaseous distention again noted.   1/25 - CTA chest - 1. Pulmonary embolus within segmental branches to the right upper lobe, left upper lobe and left lower lobe, extending distally. 2. Bilateral lower lobe airspace opacification is thought to reflect atelectasis, with trace bilateral pleural effusions. No definite evidence for aspiration. 3. Mild nonspecific left upper lobe opacities may reflect atelectasis.  1/26 - LE Dopplers - 1. Acute DVT right saphenofemoral junction, common femoral vein. 2. Acute DVT left saphenofemoral junction, left femoral vein, and mobile thrombus in the left common femoral vein.   DIAGNOSES: Principal Problem:   Acute respiratory failure with hypercapnia Active Problems:   HYPERLIPIDEMIA   TOBACCO USE   HYPERTENSION   GERD   COPD (chronic obstructive pulmonary disease)   Acute encephalopathy   Acute kidney injury   Atrial fibrillation   PE (pulmonary embolism)   Tracheostomy status  ASSESSMENT / PLAN:  PULMONARY A: 1) Acute hypercapnic respiratory failure/ARDS - secondary to flu, COPD exacerbation. Trach 1/22. 2) Aspiration - at time of intubation, finished course of treatment  3) Pneumonitis vs. CAP - b/l interstitial opacities on cxr, slightly increased at left, likely atelectasis. Completed 8/8 days rocephin, on tapering steroids  4) Pulmonary edema - improved. 4) Non-oxygen dependent COPD - h/o chronic tobacco abuse hx 3 pack per day 20+ years.  5) Bloody secretions from aspiration - Pulm  /renal syndrome ruled out. 6) Upper airway severe angioedema & rash from protamine  7) Likley small hematoma on rt apex from neck oozing  P:  - ATC as tolerated, 24x7. - Duonebs q6h + albuterol q2h PRN. - Continue steroids for rash - D/C solumedrol and decrease prednisone 20 mg PO daily. - CXR with increase in pulmonary edema will diurese. - PEG in place.  CARDIOVASCULAR  A:  1) New onset Afib with RVR - Unable to take oral amiodarone. On cardizem, amio. CE neg x 3. On heparin gtt. ? Back to NSR on 2/1 2) Hypotension - mild. With hx of HTN. 3) Anaphylaxis to protamine - protamine given in setting of trach placement, resultant anaphylactic reaction - resolved.  P:  - Goal HR < 120, cardizem gtt, currently at 10.-change to PO when able - Resume amio PO when able, may need amio IV if sustains AF - Continue PO dilt, 30 q6 hours PO today since NGT is in. - Restarted coumadin for a-fib, DVT and PE.  RENAL  Recent Labs Lab 05/21/12 0505 05/22/12 0500  NA 144 143  K 3.3* 3.1*  CL 101 101  CO2 32 31  BUN 17 16  CREATININE 0.51 0.58  GLUCOSE 112* 128*  MG 2.0 2.0  PHOS 3.6 4.2   A:  1) Acute Kidney injury - Resolved. Previously likely pre-renal in setting of volume depletion. Previously urinary sediment clogging foley, improved. Continue to monitor in setting of lasix use. 2) Hypernatremia - resolved  3) Hypokalemia - Previously in setting of lasix no in setting of NPO.  P:  - Replace K. - Two doses of lasix as ordered.  GASTROINTESTINAL No results found for this basename: AST, ALT, ALKPHOS, BILITOT, PROT, ALBUMIN,  in the last 168 hours A:  1) GERD 2) Diarrhea - resolved. C. Diff negative. 3) Colonic Ileus > resolved 4) Paraesophageal nodes - need FU CT in 3-4 mnths P: - Continue TF, Pepcid. - Minimize narcotics. - PEG in place, continue using.  HEMATOLOGIC  Recent Labs Lab 05/21/12 0505 05/22/12 0500  WBC 16.0* 17.2*  HGB 14.2 14.7  HCT 43.2 44.5  MCV 90.6  90.8  PLT 177 193   A:  1) Leukocytosis -  no fever. 3) Anticoagulation in setting of atrial fibrillation - New diagnosis. CHADS 3. Coumadin on home. On heparin gtt. 4) Bilateral PE 5) Bilateral DVT - IVC filter placement 1/27 P:  - SCD for DVT prophlaxis. - Coumadin with no heparin given GI bleed.  INFECTIOUS  Recent Labs Lab 05/21/12 0505 05/22/12 0500  WBC 16.0* 17.2*   A: 1) Flu pneumonia - Flu A and metapneumovirus > finished Tamiflu x 10d, droplet isolation d/c 1/18 2) Aspirated at time of intubation, pneumonitis vs PNA> finished Rocephin x8d. Aspirated during reintubation.  3) Nosocomial infection - Left IJ (in 8d) dc'ed 1/13 and cath tip cultures>staph/coag neg > tx finished Vanc  4) Diarrhea - resolved. 5) Leukocytosis - likely 2/2 steroids. Ceftaz 1/18 --> 1/29 w/ sputum cx negative. Procalcitonin 0.14 --> 0.15. WBC rising with low grade fever. P: Recultured 2/5 with enterobacter in urine, ceftaz started 2/6, will continue for now.  ENDOCRINE  Recent Labs Lab 05/20/12 0820 05/20/12 1210 05/20/12 1710 05/20/12 1927 05/20/12 2336  GLUCAP 95 116* 110* 109* 109*   A:  1) Diabetes mellitus, type 2 (A1c 7.1) - possible iatrogenic secondary to prolonged steroid use. 2) On steroids - CBGs at goal. Lantus off. P: ICU Hyperglycemic protocol  NEUROLOGIC / PSYCHIATRIC   A:  1) Delerium - persistent, controlled on scheduled ativan, haldol and fentanyl, prn versed  P:  - Again, goal to stay off sedating drips - Continue scheduled haldol and decrease PRN fentanyl for now given persistent ileus. - Swallow study - when delirium is better under control but for now proceed with the PEG placement, will need to call surgery on Monday to place IR unable to. - Monitor QTc - haldol 5 q6 standing - Does not qualify for LTAC, SNF/trach beds requested (possibly out of state) awaiting input from case management. - PT/ OT consult. - IV Ativan. - CIR eval - good candidate once  acute issues resolved primarily delirium.  Awaiting placement.  Alyson Reedy, M.D. [facility] Pulmonary/Critical Care Medicine. Pager: [phone]. After hours pager: [phone].

## 2012-05-22 NOTE — Progress Notes (Signed)
The patient agitated, pulled his tracheal tube out, trying to remove his PEG tube.   Respiratory placed the trach back in place with no difficulty.   Restrains ordered.

## 2012-05-22 NOTE — Progress Notes (Signed)
ANTICOAGULATION CONSULT NOTE - Follow Up Consult  Pharmacy Consult for Warfarin Indication: DVT/PE  Allergies  Allergen Reactions  . Protamine Anaphylaxis   Patient Measurements: Height: 5\' 10"  (177.8 cm) Weight: 188 lb 4.4 oz (85.4 kg) IBW/kg (Calculated) : 73  Vital Signs: Temp: 97.4 F (36.3 C) (02/14 0800) Temp src: Oral (02/14 0800) BP: 110/90 mmHg (02/14 0800) Pulse Rate: 110 (02/14 0400)  Labs:  Recent Labs  05/20/12 0138 05/21/12 0505 05/22/12 0500  HGB 15.0 14.2 14.7  HCT 44.6 43.2 44.5  PLT 146* 177 193  LABPROT  --   --  15.5*  INR  --   --  1.25  CREATININE 0.61 0.51 0.58    Estimated Creatinine Clearance: 97.6 ml/min (by C-G formula based on Cr of 0.58).   Medications:  Scheduled:  . amiodarone  400 mg Oral Daily  . antiseptic oral rinse  15 mL Mouth Rinse QID  . cefTAZidime (FORTAZ)  IV  1 g Intravenous Q8H  . chlorhexidine  15 mL Mouth Rinse BID  . clonazePAM  1 mg Per Tube BID  . diltiazem  30 mg Per Tube Q6H  . famotidine (PEPCID) IV  20 mg Intravenous Daily  . [COMPLETED] furosemide  40 mg Intravenous Once  . haloperidol lactate  5 mg Intravenous Q6H  . [COMPLETED] haloperidol lactate  5 mg Intravenous Once  . insulin aspart  0-15 Units Subcutaneous Q4H  . ipratropium  0.5 mg Nebulization Q6H  . levalbuterol  0.63 mg Nebulization Q6H  . multivitamin  5 mL Per Tube Daily  . [COMPLETED] potassium chloride  40 mEq Per Tube TID  . predniSONE  20 mg Per Tube Daily  . sodium chloride  10-40 mL Intracatheter Q12H  . [COMPLETED] warfarin  7.5 mg Oral ONCE-1800  . Warfarin - Pharmacist Dosing Inpatient   Does not apply q1800  . [DISCONTINUED] haloperidol lactate  5 mg Intravenous Q6H   Infusions:  . sodium chloride 10 mL/hr at 05/19/12 1359  . dextrose 5 % and 0.45% NaCl 20 mL/hr at 05/19/12 1311  . feeding supplement (JEVITY 1.2 CAL) 1,000 mL (05/21/12 1303)  . lactated ringers 20 mL/hr at 05/19/12 2956   Assessment: 62 yo male with new  PE/DVT (s/p IVC filter on 1/27).  He has a trach.  Warfarin started yesterday without noted bleeding complications.  His CBC is stable this morning as well.  Platelets are WNL.  Medication Review (Drug/Drug Interactions) Medications reviewed, he is currently on Amiodarone which can potentiate the INR.  Will give lower dose to avoid overshooting target INR goal.  Goal of Therapy:  INR 2.0-3.0 Monitor platelets by anticoagulation protocol: Yes   Plan:  1.  Warfarin 5 mg PO x 1 tonight 2.  Daily PT/INR 3.  Monitor for bleeding complications.  Nadara Mustard, PharmD., MS Clinical Pharmacist Pager:  [phone] Thank you for allowing pharmacy to be part of this patients care team. 05/22/2012 8:28 AM

## 2012-05-22 NOTE — Progress Notes (Signed)
Pt more cooperative and following commands; trialing off safety mittens; pt understands that if he pulls at any wires, O2, tubes and lines the mittens will go back on

## 2012-05-22 NOTE — Progress Notes (Signed)
Physical Therapy Treatment Patient Details Name: Cody [name] MRN: [id number] DOB: [birth year] Today's Date: 05/22/2012 Time: 0927-1000 PT Time Calculation (min): 33 min  PT Assessment / Plan / Recommendation Comments on Treatment Session  Adm. SOB and resp. failure due to flu and pna with VDRF 1/5-1/15, 1/18-1/21 with trach and bronchoscopy 1/22. Continues to make steady progress. Ambuated 16 ft today with modA (8 ft x2 with seated rest break). Stood 4 times today needing only modA. Pt remains an excellent CIR candidate having met several more goals today. Appears frustrated with situation today perseverating on wanting water and to use the bathroom normally (hates the flexi seal and wants to sit on a toilet). Pt also annoyed that family not there so I called his girlfriend per his request and she reports they are coming in today.     Follow Up Recommendations  CIR     Does the patient have the potential to tolerate intense rehabilitation     Barriers to Discharge        Equipment Recommendations  Rolling walker with 5" wheels    Recommendations for Other Services Rehab consult  Frequency Min 3X/week   Plan Discharge plan remains appropriate;Frequency remains appropriate    Precautions / Restrictions Precautions Precautions: Fall Precaution Comments: trach, PEG Restrictions Weight Bearing Restrictions: No   Pertinent Vitals/Pain Denies pain, did grimace once when pulling the pad out from under his bottom    Mobility  Bed Mobility Bed Mobility: Rolling Left;Left Sidelying to Sit;Sitting - Scoot to [place] of Bed Rolling Right: 4: Min assist Left Sidelying to Sit: 3: Mod assist Sitting - Scoot to Edge of Bed: 4: Min assist;4: Min guard Details for Bed Mobility Assistance: vc for sequencing and v/c's for guidance of movement and assist to elevated trunk from bed, use of pad for reciprocal scooting but pt able to lift buttocks off bed with upper extremity support and scoot  anteriorly without assist 2nd attempt Transfers Transfers: Sit to Stand;Stand to Sit Sit to Stand: 3: Mod assist;From bed;From chair/3-in-1;With upper extremity assist Stand to Sit: 3: Mod assist;To chair/3-in-1;To bed;With upper extremity assist Details for Transfer Assistance: cues for safe hand placement and facilitation to bring trunk over BOS and power up for extension of trunk and lower extremities; sit<>stand x3 for strengthening and technique Ambulation/Gait Ambulation/Gait Assistance: 3: Mod assist Ambulation Distance (Feet): 16 Feet Assistive device: Rolling walker Ambulation/Gait Assistance Details: facilitation and v/c's for tall posture, increase BOS and stride length as well as safe technique with RW, stability assist especially with turning Gait Pattern: Decreased stride length;Trunk flexed;Narrow base of support General Gait Details: decreased step height bilaterally Stairs: No      PT Goals Acute Rehab PT Goals Time For Goal Achievement: 05/29/12 Potential to Achieve Goals: Good PT Goal: Rolling Supine to Left Side - Progress: Progressing toward goal PT Goal: Supine/Side to Sit - Progress: Progressing toward goal Pt will go Sit to Stand: with supervision PT Goal: Sit to Stand - Progress: Updated due to goal met Pt will go Stand to Sit: with supervision PT Goal: Stand to Sit - Progress: Updated due to goals met Pt will Transfer Bed to Chair/Chair to Bed: with min assist PT Transfer Goal: Bed to Chair/Chair to Bed - Progress: Updated due to goal met PT Goal: Stand - Progress: Progressing toward goal Pt will Ambulate: with min assist;51 - 150 feet;with least restrictive assistive device PT Goal: Ambulate - Progress: Updated due to goal met  Visit Information  Last  PT Received On: 05/22/12 Assistance Needed: +2    Subjective Data  Subjective: wants water, wants to "shit" and wants his son to come pick him up Patient Stated Goal: water   Cognition   Cognition Overall Cognitive Status: Impaired Area of Impairment: Attention Difficult to assess due to: Tracheostomy Arousal/Alertness: Awake/alert Orientation Level: Appears intact for tasks assessed Behavior During Session: Agitated Current Attention Level: Selective Following Commands: Follows one step commands consistently;Follows multi-step commands consistently Safety/Judgement: Impulsive;Decreased awareness of need for assistance Awareness of Deficits: needs reminders about his situation and why he not allowed to drink yet, impulsively trying to stand up without therapist there to help him needing reminders that he still needs help Problem Solving: min-mod functional basic Cognition - Other Comments: perseverating on water; was able to tell me to call his son and girlfriend today, wrote their numbers down on a sheet of paper    Balance  Static Sitting Balance Static Sitting - Balance Support: Bilateral upper extremity supported Static Sitting - Level of Assistance: 4: Min assist Static Sitting - Comment/# of Minutes: v/c's and facilitation for tall posture and forward gaze  End of Session PT - End of Session Equipment Utilized During Treatment: Gait belt Activity Tolerance: Patient tolerated treatment well Patient left: in chair;with call bell/phone within reach Nurse Communication: Mobility status   GP     [facility] HELEN 05/22/2012, 1:29 PM

## 2012-05-22 NOTE — Progress Notes (Signed)
Agree with above. Pt very frustrated.    Wants to go to bathroom normally. Trickle feeds.

## 2012-05-22 NOTE — Progress Notes (Signed)
Pt pulled PICC line out. New 20G LFA PIV inserted. Dr Molli Knock paged. Dressing applied to RUA (old PICC line insertion site). PICC line was not sutured in. No bleeding noted.

## 2012-05-22 NOTE — Progress Notes (Signed)
Found pt with trach pulled out & trach collar off. Placed pt's trach back in with no difficulty noted. Sp02 on 50% ATC 91%, HR 109, BP 157/100. MD notified -Dr. Frederico Hamman. Pt suctioned.

## 2012-05-22 NOTE — Progress Notes (Signed)
Patient ID: Cody [name], male   DOB: 1950-10-17, 62 y.o.   MRN: [id number] 3 Days Post-Op  Subjective: No complaints this am, denies n/v with trickle feeds, denies bloating, +bm's through flexiseal.    Objective: Vital signs in last 24 hours: Temp:  [97.2 F (36.2 C)-97.8 F (36.6 C)] 97.4 F (36.3 C) (02/14 0800) Pulse Rate:  [84-113] 110 (02/14 0400) Resp:  [16-30] 26 (02/14 0800) BP: (110-146)/(70-94) 110/90 mmHg (02/14 0800) SpO2:  [90 %-98 %] 93 % (02/14 0800) FiO2 (%):  [50 %] 50 % (02/14 0725) Last BM Date: 05/22/12  Intake/Output from previous day: 02/13 0701 - 02/14 0700 In: 750 [NG/GT:10; IV Piggyback:450] Out: 1850 [Urine:1650; Stool:200] Intake/Output this shift: Total I/O In: 10 [Other:10] Out: 225 [Urine:150; Stool:75]  PE: Gen:  Alert, NAD Abd: Soft, ND, minimal pain around PEG site, +BS, PEG c/d/i   Lab Results:   Recent Labs  05/21/12 0505 05/22/12 0500  WBC 16.0* 17.2*  HGB 14.2 14.7  HCT 43.2 44.5  PLT 177 193   BMET  Recent Labs  05/21/12 0505 05/22/12 0500  NA 144 143  K 3.3* 3.1*  CL 101 101  CO2 32 31  GLUCOSE 112* 128*  BUN 17 16  CREATININE 0.51 0.58  CALCIUM 9.5 9.9   PT/INR  Recent Labs  05/22/12 0500  LABPROT 15.5*  INR 1.25   CMP     Component Value Date/Time   NA 143 05/22/2012 0500   K 3.1* 05/22/2012 0500   CL 101 05/22/2012 0500   CO2 31 05/22/2012 0500   GLUCOSE 128* 05/22/2012 0500   BUN 16 05/22/2012 0500   CREATININE 0.58 05/22/2012 0500   CALCIUM 9.9 05/22/2012 0500   PROT 5.6* 05/05/2012 0420   ALBUMIN 2.3* 05/05/2012 0420   AST 24 05/05/2012 0420   ALT 33 05/05/2012 0420   ALKPHOS 72 05/05/2012 0420   BILITOT 0.9 05/05/2012 0420   GFRNONAA >90 05/22/2012 0500   GFRAA >90 05/22/2012 0500   Lipase     Component Value Date/Time   LIPASE 10* 05/20/2012 0138       Studies/Results: Dg Chest Port 1 View  05/22/2012  *RADIOLOGY REPORT*  Clinical Data: Respiratory failure.  PORTABLE CHEST - 1 VIEW   Comparison: Chest 05/14/2012 and 05/21/2012.  Findings: Support tubes and lines are unchanged.  There has been interval worsening of left basilar airspace disease.  Right lung appears clear.  Heart size is upper normal.  No pneumothorax or pleural fluid.  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  IMPRESSION: Worsening left basilar airspace disease.   Original Report Authenticated By: Holley Dexter, M.D.    Dg Chest Port 1 View  05/21/2012  *RADIOLOGY REPORT*  Clinical Data: Hypoxic respiratory failure.  PORTABLE CHEST - 1 VIEW  Comparison: 05/14/2012 and CT chest 05/02/2012.  Findings: Tracheostomy is midline.  Right PICC tip projects over the SVC.  Heart size stable.  Lungs are low in volume with mild bibasilar air space disease, left greater than right.  There may be a small left pleural effusion.  IMPRESSION: Low lung volumes with mild bibasilar air space disease, left greater than right, and probable small left pleural effusion.   Original Report Authenticated By: Leanna Battles, M.D.     Anti-infectives: Anti-infectives   Start     Dose/Rate Route Frequency Ordered Stop   05/15/12 0000  ceFAZolin (ANCEF) IVPB 2 g/50 mL premix    Comments:  Hang ON  CALL to IR 2/7   2 g 100 mL/hr over 30 Minutes Intravenous  Once 05/14/12 1230 05/15/12 0055   05/14/12 1800  cefTAZidime (FORTAZ) 1 g in dextrose 5 % 50 mL IVPB     1 g 100 mL/hr over 30 Minutes Intravenous 3 times per day 05/14/12 1702     05/14/12 1645  cefTRIAXone (ROCEPHIN) 1 g in dextrose 5 % 50 mL IVPB  Status:  Discontinued     1 g 100 mL/hr over 30 Minutes Intravenous Every 24 hours 05/14/12 1632 05/14/12 1633   04/29/12 1400  piperacillin-tazobactam (ZOSYN) IVPB 3.375 g  Status:  Discontinued     3.375 g 12.5 mL/hr over 240 Minutes Intravenous 3 times per day 04/29/12 1051 05/06/12 1111   04/26/12 1946  vancomycin (VANCOCIN) IVPB 1000 mg/200 mL premix  Status:  Discontinued     1,000 mg 200  mL/hr over 60 Minutes Intravenous Every 8 hours 04/26/12 1429 05/02/12 1002   04/25/12 1200  cefTAZidime (FORTAZ) 1 g in dextrose 5 % 50 mL IVPB  Status:  Discontinued     1 g 100 mL/hr over 30 Minutes Intravenous 3 times per day 04/25/12 1106 04/29/12 1051   04/20/12 1215  vancomycin (VANCOCIN) IVPB 1000 mg/200 mL premix  Status:  Discontinued     1,000 mg 200 mL/hr over 60 Minutes Intravenous Every 12 hours 04/20/12 1153 04/26/12 1429   04/17/12 0000  cefTRIAXone (ROCEPHIN) 1 g in dextrose 5 % 50 mL IVPB  Status:  Discontinued     1 g 120 mL/hr over 30 Minutes Intravenous Every 24 hours 04/16/12 1516 04/22/12 1639   04/14/12 1100  vancomycin (VANCOCIN) IVPB 1000 mg/200 mL premix  Status:  Discontinued     1,000 mg 200 mL/hr over 60 Minutes Intravenous Every 12 hours 04/14/12 0821 04/15/12 1005   04/13/12 2300  vancomycin (VANCOCIN) 750 mg in sodium chloride 0.9 % 150 mL IVPB  Status:  Discontinued     750 mg 150 mL/hr over 60 Minutes Intravenous Every 12 hours 04/13/12 1347 04/14/12 0821   04/13/12 1400  oseltamivir (TAMIFLU) 6 MG/ML suspension 150 mg     150 mg Oral 2 times daily 04/13/12 1327 04/22/12 2210   04/13/12 1400  vancomycin (VANCOCIN) 1,500 mg in sodium chloride 0.9 % 500 mL IVPB     1,500 mg 250 mL/hr over 120 Minutes Intravenous  Once 04/13/12 1347 04/13/12 1706   04/13/12 0600  piperacillin-tazobactam (ZOSYN) IVPB 3.375 g     3.375 g 12.5 mL/hr over 240 Minutes Intravenous 3 times per day 04/13/12 0102 04/17/12 0101   04/12/12 2200  piperacillin-tazobactam (ZOSYN) IVPB 3.375 g     3.375 g 100 mL/hr over 30 Minutes Intravenous  Once 04/12/12 2148 04/12/12 2347   04/12/12 2145  azithromycin (ZITHROMAX) 500 mg in dextrose 5 % 250 mL IVPB  Status:  Discontinued     500 mg 250 mL/hr over 60 Minutes Intravenous Every 24 hours 04/12/12 2143 04/15/12 1005       Assessment/Plan 1. POD#4-PEG tube 05/18/12: tolerating trickle feeds, may advance as tolerated, dressing changes  per protocol, will see again on Monday but available over weekend if problems   LOS: 40 days    Kymiah Araiza 05/22/2012, 8:19 AM

## 2012-05-22 NOTE — Progress Notes (Signed)
Speech Language Pathology  Patient Details Name: Cody [name] MRN: [id number] DOB: 1951-01-14 Today's Date: 05/22/2012 Time:  -    MBS scheduled today at 1300.  Breck Coons [place].Ed [employer] [phone]  05/22/2012

## 2012-05-22 NOTE — Progress Notes (Signed)
Clinical Social Work  Per chart review, patient now at 50% for trach collar. Patient needs to be at 28% or lower for SNF placement. CSW will continue to follow patient to assist with dc planning.   Unk Lightning, LCSW (Coverage for [employer])
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
## 2012-05-22 NOTE — Procedures (Signed)
Objective Swallowing Evaluation: Modified Barium Swallowing Study  Patient Details  Name: Cody [name] MRN: [id number] Date of Birth: [birth year]  Today's Date: 05/22/2012 Time: 4098-1191 SLP Time Calculation (min): 20 min  Past Medical History:  Past Medical History  Diagnosis Date  . Stroke   . Hypertension   . Hyperlipidemia   . GERD (gastroesophageal reflux disease)   . COPD (chronic obstructive pulmonary disease)    Past Surgical History:  Past Surgical History  Procedure Laterality Date  . Gastrostomy N/A 05/19/2012    Procedure: PEG Possible Open Gastrostomy;  Surgeon: Kandis Cocking, MD;  Location: [facility] OR;  Service: General;  Laterality: N/A;   HPI:  Cody [name] is a 62 year old white male with PMH of COPD, 3 pack per day smoking history 20+ years, and HTN presenting to the ED 1/5 with complaints of worsening shortness of breath x4 days. History obtained by fiance. Cody [name] has baseline shortness of breath especially on exertion, however, for the past 4 days he has been having increased difficulty and has also developed a worsening productive cough with white sputum and occasionally streaked with bright red blood. Fiance reports increasing confusion and extreme light headedmess when standing. She denied him to have any headaches, fevers, LOC, N/V/D, chest pain, abdominal pain, or any urinary complaints. Diagnosed with potential influenza with aspiration PNA (? during intubation). Intubated due to respiratory distress 1/5. Extubated 1/15.  Initial BSE performed 04/23/12, placed on po diet and discharged from ST.  Pt. was subsequently reintubated due to respiratory distress, self extubated 1/21 and reintubated same day.  Trach placed 1/22.  FEES performed 1/31 with recommendations for NPO and pt. received PEG last week.  Pt.'s trach was downsized to #6 cuffless this week and he continues to tolerate his speaking valve with increased vocal intensity.  Repeat MBS recommneded.      Assessment / Plan / Recommendation Clinical Impression  Dysphagia Diagnosis: Moderate pharyngeal phase dysphagia;Severe pharyngeal phase dysphagia Clinical impression: MBS completed with PMSV in place and all vital signs stable.  Swallow function has mildly improved from previous objective assessment (FEES 1/31), however he continues to exhibit moderate-severe motor based pharyngeal dysphagia and is unsafe to initiate po's.  Decreased laryngeal elevation, epiglottic deflection lead to silent larygneal penetration to vocal cords and silent aspiration with thin barium.  Honey thick barium is not recommended due to increase in amount of vallecular and pyriform sinus residue and potential for spilling into trachea.  Pt.'s cognition is somewhat improved, however he continues to exhibit decreased attention and awareness and would not have been able to carry out strategies to decrease aspiration risk.  Conintue NPO with nutrition via PEG.  Potential for improvement is very good and ST will continue to work with pt. on PMSV and dysphagia.         Treatment Recommendation  Therapy as outlined in treatment plan below    Diet Recommendation NPO        Other  Recommendations Oral Care Recommendations: Oral care BID   Follow Up Recommendations  Inpatient Rehab    Frequency and Duration min 2x/week  2 weeks   Pertinent Vitals/Pain none    SLP Swallow Goals Goal #4: Pt. will consume trials of puree only with SLP for increased strength during treatment with max verbal/visual cues.   General HPI: Cody [name] is a 62 year old white male with PMH of COPD, 3 pack per day smoking history 20+ years, and HTN presenting to the ED  1/5 with complaints of worsening shortness of breath x4 days. History obtained by fiance. Cody [name] has baseline shortness of breath especially on exertion, however, for the past 4 days he has been having increased difficulty and has also developed a worsening productive cough with  white sputum and occasionally streaked with bright red blood. Fiance reports increasing confusion and extreme light headedmess when standing. She denied him to have any headaches, fevers, LOC, N/V/D, chest pain, abdominal pain, or any urinary complaints. Diagnosed with potential influenza with aspiration PNA (? during intubation). Intubated due to respiratory distress 1/5. Extubated 1/15.  Initial BSE performed 04/23/12, placed on po diet and discharged from ST.  Pt. was subsequently reintubated due to respiratory distress, self extubated 1/21 and reintubated same day.  Trach placed 1/22.  FEES performed 1/31 with recommendations for NPO and pt. received PEG last week.  Pt.'s trach was downsized to #6 cuffless this week and he continues to tolerate his speaking valve with increased vocal intensity.  Repeat MBS recommneded. Type of Study: Modified Barium Swallowing Study Reason for Referral: Objectively evaluate swallowing function Diet Prior to this Study: NPO;PEG tube Temperature Spikes Noted: No Respiratory Status: Trach Trach Size and Type: #6;Uncuffed History of Recent Intubation: Yes Length of Intubations (days): 10 days Date extubated: 04/22/12 Behavior/Cognition: Alert;Cooperative;Requires cueing;Decreased sustained attention Oral Cavity - Dentition: Missing dentition Oral Motor / Sensory Function: Impaired - see Bedside swallow eval Self-Feeding Abilities: Able to feed self;Needs assist Patient Positioning: Upright in chair Anatomy: Within functional limits Pharyngeal Secretions: Not observed secondary MBS    Reason for Referral Objectively evaluate swallowing function   Oral Phase Oral Preparation/Oral Phase Oral Phase: Impaired Oral - Nectar Oral - Nectar Cup: Delayed oral transit Oral - Solids Oral - Puree: Delayed oral transit (minimal)   Pharyngeal Phase Pharyngeal Phase Pharyngeal Phase: Impaired Pharyngeal - Honey Pharyngeal - Honey Cup: Pharyngeal residue -  valleculae;Pharyngeal residue - pyriform sinuses;Reduced laryngeal elevation;Reduced tongue base retraction Pharyngeal - Nectar Pharyngeal - Nectar Cup: Pharyngeal residue - valleculae;Pharyngeal residue - pyriform sinuses;Reduced laryngeal elevation;Reduced anterior laryngeal mobility;Reduced airway/laryngeal closure;Moderate aspiration;Penetration/Aspiration during swallow Penetration/Aspiration details (nectar cup): Material enters airway, CONTACTS cords and not ejected out Pharyngeal - Thin Pharyngeal - Thin Cup: Penetration/Aspiration during swallow;Pharyngeal residue - valleculae;Pharyngeal residue - pyriform sinuses;Moderate aspiration;Reduced tongue base retraction;Reduced laryngeal elevation Penetration/Aspiration details (thin cup): Material enters airway, passes BELOW cords without attempt by patient to eject out (silent aspiration) Pharyngeal - Solids Pharyngeal - Puree: Reduced epiglottic inversion;Reduced pharyngeal peristalsis;Reduced anterior laryngeal mobility;Pharyngeal residue - valleculae;Pharyngeal residue - pyriform sinuses;Reduced laryngeal elevation;Reduced tongue base retraction  Cervical Esophageal Phase    GO    Cervical Esophageal Phase Cervical Esophageal Phase: [facility]         Darrow Bussing.Ed [employer] [phone]  05/22/2012

## 2012-05-22 NOTE — Progress Notes (Signed)
OT Cancellation Note  Treatment cancelled today due to patient receiving procedure or test (MBS).   05/22/2012 Cipriano Mile OTR/L Pager [phone] Office [phone]

## 2012-05-23 LAB — GLUCOSE, CAPILLARY
Glucose-Capillary: 102 mg/dL — ABNORMAL HIGH (ref 70–99)
Glucose-Capillary: 103 mg/dL — ABNORMAL HIGH (ref 70–99)
Glucose-Capillary: 110 mg/dL — ABNORMAL HIGH (ref 70–99)
Glucose-Capillary: 137 mg/dL — ABNORMAL HIGH (ref 70–99)
Glucose-Capillary: 140 mg/dL — ABNORMAL HIGH (ref 70–99)
Glucose-Capillary: 147 mg/dL — ABNORMAL HIGH (ref 70–99)
Glucose-Capillary: 169 mg/dL — ABNORMAL HIGH (ref 70–99)
Glucose-Capillary: 173 mg/dL — ABNORMAL HIGH (ref 70–99)

## 2012-05-23 LAB — PROTIME-INR: INR: 2.08 — ABNORMAL HIGH (ref 0.00–1.49)

## 2012-05-23 LAB — BASIC METABOLIC PANEL
CO2: 25 mEq/L (ref 19–32)
Chloride: 104 mEq/L (ref 96–112)
Sodium: 144 mEq/L (ref 135–145)

## 2012-05-23 LAB — CBC
HCT: 48.4 % (ref 39.0–52.0)
Hemoglobin: 16 g/dL (ref 13.0–17.0)
RBC: 5.22 MIL/uL (ref 4.22–5.81)
WBC: 22.9 10*3/uL — ABNORMAL HIGH (ref 4.0–10.5)

## 2012-05-23 LAB — MAGNESIUM: Magnesium: 1.9 mg/dL (ref 1.5–2.5)

## 2012-05-23 LAB — PHOSPHORUS: Phosphorus: 4.2 mg/dL (ref 2.3–4.6)

## 2012-05-23 MED ORDER — FREE WATER
100.0000 mL | Freq: Three times a day (TID) | Status: DC
  Administered 2012-05-23 – 2012-05-25 (×7): 100 mL

## 2012-05-23 MED ORDER — WARFARIN SODIUM 3 MG PO TABS
3.0000 mg | ORAL_TABLET | Freq: Once | ORAL | Status: AC
  Administered 2012-05-23: 3 mg via ORAL
  Filled 2012-05-23: qty 1

## 2012-05-23 MED ORDER — PREDNISONE 5 MG/ML PO CONC
10.0000 mg | Freq: Every day | ORAL | Status: DC
  Administered 2012-05-24: 10 mg
  Filled 2012-05-23: qty 2

## 2012-05-23 MED ORDER — JEVITY 1.2 CAL PO LIQD
1000.0000 mL | ORAL | Status: DC
  Administered 2012-05-24 – 2012-05-25 (×2): 1000 mL
  Administered 2012-05-27: 40 mL
  Filled 2012-05-23 (×8): qty 1000

## 2012-05-23 MED ORDER — QUETIAPINE FUMARATE 100 MG PO TABS
100.0000 mg | ORAL_TABLET | Freq: Every day | ORAL | Status: DC
  Administered 2012-05-23 – 2012-05-24 (×2): 100 mg
  Filled 2012-05-23 (×3): qty 1

## 2012-05-23 NOTE — Progress Notes (Signed)
ANTICOAGULATION CONSULT NOTE - Follow Up Consult  Pharmacy Consult for Coumadin Indication: DVT/PE  Allergies  Allergen Reactions  . Protamine Anaphylaxis    Patient Measurements: Height: 5\' 10"  (177.8 cm) Weight: 170 lb 6.7 oz (77.3 kg) IBW/kg (Calculated) : 73   Vital Signs: Temp: 98.1 F (36.7 C) (02/15 1210) Temp src: Oral (02/15 1210) BP: 108/72 mmHg (02/15 1400) Pulse Rate: 92 (02/15 1400)  Labs:  Recent Labs  05/21/12 0505 05/22/12 0500 05/23/12 0640  HGB 14.2 14.7 16.0  HCT 43.2 44.5 48.4  PLT 177 193 218  LABPROT  --  15.5* 22.5*  INR  --  1.25 2.08*  CREATININE 0.51 0.58 0.76    Estimated Creatinine Clearance: 97.6 ml/min (by C-G formula based on Cr of 0.76).   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 He has a trach. INR in therapeutic range at 2.02. His CBC is stable this morning as well. Platelets are WNL. No bleeding noted.  Large INR jump 1.25>2.02 after 7.5mg  x1 and 5mg  x1. Will give decreased dose to keep INR in therapeutic range with amiodarone on board.  Goal of Therapy:  INR 2-3 Monitor platelets by anticoagulation protocol: Yes   Plan:  1. Warfarin 3 mg PO x 1 tonight  2. Daily PT/INR  3. Monitor for bleeding complications.   Thank you for the consult.  Tomi Bamberger, PharmD Clinical Pharmacist Pager: [phone] Pharmacy: [phone] 05/23/2012 3:36 PM

## 2012-05-23 NOTE — Progress Notes (Signed)
The patient continues to be agitated and pulling at things.  Getting tube feedings through his G-tube and tolerating it well at 10 cc/hr.  Can increase rate as needed.  An abdominal binder may help keep patient from having access to pull at G-tube.  His rise in WBC cannot be accounted for by his PEG--this is totally fine.  We will sign off for now.  Marta Lamas. Gae Bon, MD, FACS [phone] [facility]

## 2012-05-23 NOTE — Progress Notes (Signed)
PULMONARY  / CRITICAL CARE MEDICINE  Name: Cody [name] MRN: [id number] DOB: 19-Jan-1951    LOS: 41  REFERRING MD:   EDP - Dr. Preston Fleeting  CHIEF COMPLAINT:  Shortness of breath  BRIEF PATIENT DESCRIPTION: 40 yoM with COPD (3ppd smoking) admitted 1/5 with VDRF 2/2 influenza A and asp PNA, intubated 04/13/11 - 1/15 for AW protection, aspirated during intubation. Reintubated emergently 1/18 for severe delerium & resp distress, purulent secretions noted. Trach 1/22. New PE, DVT BL, s/p IVF filter 1/27. On hep gtt.   LINES / TUBES: ETT 1/5 >>1/15 , 1/18 >> self extubated 1/21 >>> reintubated 1/21 L IJ 1/5 >> 1/13 R IJ 1/13 >> 1/16 RIJ 1/18 >> 1/28 R A-line 1/5 >>1/8 pulled out by pt Trach 1/22 IVC Filter 1/27 >> PICC 1/28 >>  CULTURES: Blood culture 1/5>> Negative Resp culture 1/6>> H. influenzae  Resp viral panel 1/6>> Influenza A and Metapneumovirus  Sputum Leigonella Culture 1/6>> Negative Bronch Sputum 1/6>> H. influenzae  Flu 1/5>> Negative Blood culture 01/13 >> Negative Catheter tip culture 1/13 >>Staph -coag neg-70 colonies(vanc sens)  C.diff PCR 1/14 >> Negative Resp culture 1/18 >>> Negative Blood culture 2/6>>>NTD Urine 2/6>>>Enterobacter aerogenes pan sensitive.  ANTIBIOTICS: Zosyn 1/5>>1/9 - restarted 1/22 >>1/28 Azithro 1/5>>1/8  Vanc 1/6>>>1/8 - restarted 1/13 >>1/14 Tamiflu 1/6>>>1/16  Rocephin 1/9>>1/15 Ceftazidine 1/18 >>1/22>>>2/6>>>  SIGNIFICANT EVENTS:  1/5 Intubated in ED  1/5 Shock , levophed  1/6 ECHO - 55%, mod dil rv, PA pressures>>> not measured b/c no TR jet  1/6 Bronched- diffuse pus all lobes  1/7 increasing pCO2, increased RR on vent  1/8 Changed sedation to Precedex, more agitated than with versed  1/9 Changed sedation back to Versed, agitation greatly improved  1/10 scheduled haldol  1/12 Restarting precedex gtt due to persistent agitation 1/13 Febrile to 102.6 overnight. ? Catheter infection? DC'ed L IJ, placed new R IJ. 1/14 No fevers  overnight, agitation controlled on propofol (added 1/13), more interactive. 1/15 Had foul smelling stools overnight, therefore, C.diff PCR was sent by RN. 1/15 EXTUBATED, developed A.fib with RVR overnight. 1/16 Persistent episodes of desaturation., afib>started on cardizem drip  1/17 increased agitation  1/18 reintubated  1/20 Persistent A. Fib this morning on dilt drip.  Rates controlled between 99-125 1/21 Self extubated, given a chance and continued to be in respiratory distress so reintubated.  1/22 Tracheostomy, complicated by bleeding, angio edema from protamine 1/23 Continued agitation. 1/23 Neg acute ct head 1/24 PEG placement planned --> delayed bc of ileus 1/25 CTA showing PE BL --> started on heparin gtt. 1/26 BL LE DVT with mobile thrombus 1/27 IVC Filter placement. 1/28 PICC Line 1/29 Persistent ileus per abdominal x-ray. Attempting to limit narcotics. And 1/30 Tolerated PMSV placement for 15 minutes and fatigue and    SUBJECTIVE / INTERVAL HISTORY:  Lethargic, +/- F/C   VITAL SIGNS: Temp:  [97.4 F (36.3 C)-98.2 F (36.8 C)] 98 F (36.7 C) (02/15 1935) Pulse Rate:  [91-117] 101 (02/15 2300) Resp:  [17-31] 20 (02/15 2300) BP: (97-151)/(50-112) 118/87 mmHg (02/15 2300) SpO2:  [89 %-97 %] 94 % (02/15 2300) FiO2 (%):  [40 %-50 %] 40 % (02/15 2300) Weight:  [77.3 kg (170 lb 6.7 oz)] 77.3 kg (170 lb 6.7 oz) (02/15 0500) HEMODYNAMICS: VENTILATOR SETTINGS: Vent Mode:  [-]  FiO2 (%):  [40 %-50 %] 40 % INTAKE / OUTPUT:  Intake/Output Summary (Last 24 hours) at 05/23/12 2333 Last data filed at 05/23/12 1900  Gross per 24 hour  Intake    530 ml  Output   1625 ml  Net  -1095 ml   PHYSICAL EXAMINATION: General: Tracheostomy in place, on ventilator, opens eyes to command and tracks around the room but not following any command.  Head: Normocephalic, Trach in place, no bleeding.  Lungs:  Diminished BS, coarse breath sounds clearing.Marland Kitchen  Heart: Irregularly irregular  rhythm, regular rate.  Abdomen:  BS normoactive. Soft, Nondistended, non-tender.  No masses or organomegaly.  Extremities: No pretibial edema.    IMAGING:  1/30 - NG tube coiled overlying the proximal stomach.gaseous distention again noted.   1/25 - CTA chest - 1. Pulmonary embolus within segmental branches to the right upper lobe, left upper lobe and left lower lobe, extending distally. 2. Bilateral lower lobe airspace opacification is thought to reflect atelectasis, with trace bilateral pleural effusions. No definite evidence for aspiration. 3. Mild nonspecific left upper lobe opacities may reflect atelectasis.  1/26 - LE Dopplers - 1. Acute DVT right saphenofemoral junction, common femoral vein. 2. Acute DVT left saphenofemoral junction, left femoral vein, and mobile thrombus in the left common femoral vein.   DIAGNOSES: Principal Problem:   Acute respiratory failure with hypercapnia Active Problems:   HYPERLIPIDEMIA   TOBACCO USE   HYPERTENSION   GERD   COPD (chronic obstructive pulmonary disease)   Acute encephalopathy   Acute kidney injury   Atrial fibrillation   PE (pulmonary embolism)   Tracheostomy status  ASSESSMENT / PLAN:  PULMONARY A: 1) Acute hypercapnic respiratory failure/ARDS - secondary to flu, COPD exacerbation. Trach 1/22. 2) Aspiration - at time of intubation, finished course of treatment  3) Pneumonitis vs. CAP - b/l interstitial opacities on cxr, slightly increased at left, likely atelectasis. Completed 8/8 days rocephin, on tapering steroids  4) Pulmonary edema - improved. 4) Non-oxygen dependent COPD - h/o chronic tobacco abuse hx 3 pack per day 20+ years.  5) Bloody secretions from aspiration - Pulm /renal syndrome ruled out. 6) Upper airway severe angioedema & rash from protamine  7) Likley small hematoma on rt apex from neck oozing  P:  - ATC as tolerated, 24x7. - Duonebs q6h + albuterol q2h PRN. - Continue steroids for rash - D/C solumedrol and  decrease prednisone 20 mg PO daily. - CXR with increase in pulmonary edema will diurese. - PEG in place.  CARDIOVASCULAR  A:  1) New onset Afib with RVR - Unable to take oral amiodarone. On cardizem, amio. CE neg x 3. On heparin gtt. ? Back to NSR on 2/1 2) Hypotension - mild. With hx of HTN. 3) Anaphylaxis to protamine - protamine given in setting of trach placement, resultant anaphylactic reaction - resolved.  P:  - Goal HR < 120, cardizem gtt, currently at 10.-change to PO when able - Resume amio PO when able, may need amio IV if sustains AF - Continue PO dilt, 30 q6 hours PO today since NGT is in. - Restarted coumadin for a-fib, DVT and PE.  RENAL  Recent Labs Lab 05/22/12 0500 05/23/12 0640  NA 143 144  K 3.1* 4.3  CL 101 104  CO2 31 25  BUN 16 20  CREATININE 0.58 0.76  GLUCOSE 128* 136*  MG 2.0 1.9  PHOS 4.2 4.2   A:  1) Acute Kidney injury - Resolved. Previously likely pre-renal in setting of volume depletion. Previously urinary sediment clogging foley, improved. Continue to monitor in setting of lasix use. 2) Hypernatremia - resolved  3) Hypokalemia - Previously in setting of  lasix no in setting of NPO.  P:  - Replace K. - Two doses of lasix as ordered.  GASTROINTESTINAL No results found for this basename: AST, ALT, ALKPHOS, BILITOT, PROT, ALBUMIN,  in the last 168 hours A:  1) GERD 2) Diarrhea - resolved. C. Diff negative. 3) Colonic Ileus > resolved 4) Paraesophageal nodes - need FU CT in 3-4 mnths P: - Continue TF, Pepcid. - Minimize narcotics. - PEG in place, continue using.  HEMATOLOGIC  Recent Labs Lab 05/22/12 0500 05/23/12 0640  WBC 17.2* 22.9*  HGB 14.7 16.0  HCT 44.5 48.4  MCV 90.8 92.7  PLT 193 218   A:  1) Leukocytosis -  no fever. 3) Anticoagulation in setting of atrial fibrillation - New diagnosis. CHADS 3. Coumadin on home. On heparin gtt. 4) Bilateral PE 5) Bilateral DVT - IVC filter placement 1/27 P:  - SCD for DVT  prophlaxis. - Coumadin with no heparin given GI bleed.  INFECTIOUS  Recent Labs Lab 05/22/12 0500 05/23/12 0640  WBC 17.2* 22.9*   A: 1) Flu pneumonia - Flu A and metapneumovirus > finished Tamiflu x 10d, droplet isolation d/c 1/18 2) Aspirated at time of intubation, pneumonitis vs PNA> finished Rocephin x8d. Aspirated during reintubation.  3) Nosocomial infection - Left IJ (in 8d) dc'ed 1/13 and cath tip cultures>staph/coag neg > tx finished Vanc  4) Diarrhea - resolved. 5) Leukocytosis - likely 2/2 steroids. Ceftaz 1/18 --> 1/29 w/ sputum cx negative. Procalcitonin 0.14 --> 0.15. WBC rising with low grade fever. P: Recultured 2/5 with enterobacter in urine, ceftaz started 2/6, will continue for now.  ENDOCRINE  Recent Labs Lab 05/23/12 0345 05/23/12 0759 05/23/12 1209 05/23/12 1547 05/23/12 1934  GLUCAP 78 138* 124* 149* 178*   A:  1) Diabetes mellitus, type 2 (A1c 7.1) - possible iatrogenic secondary to prolonged steroid use. 2) On steroids - CBGs at goal. Lantus off. P: ICU Hyperglycemic protocol  NEUROLOGIC / PSYCHIATRIC   A:  1) Delirium, improved  P:  - Decrease clonazepam, - Change haldol to seroquel   Billy Fischer, MD ; [facility] service Mobile [phone].  After 5:30 PM or weekends, call [phone]   ADD: D/C ceftaz

## 2012-05-24 LAB — PROTIME-INR
INR: 1.7 — ABNORMAL HIGH (ref 0.00–1.49)
Prothrombin Time: 19.4 seconds — ABNORMAL HIGH (ref 11.6–15.2)

## 2012-05-24 LAB — GLUCOSE, CAPILLARY
Glucose-Capillary: 167 mg/dL — ABNORMAL HIGH (ref 70–99)
Glucose-Capillary: 162 mg/dL — ABNORMAL HIGH (ref 70–99)
Glucose-Capillary: 172 mg/dL — ABNORMAL HIGH (ref 70–99)

## 2012-05-24 MED ORDER — WARFARIN SODIUM 5 MG PO TABS
5.0000 mg | ORAL_TABLET | Freq: Once | ORAL | Status: AC
  Administered 2012-05-24: 5 mg via ORAL
  Filled 2012-05-24: qty 1

## 2012-05-24 NOTE — Progress Notes (Signed)
PULMONARY  / CRITICAL CARE MEDICINE  Name: Cody [name] MRN: [id number] DOB: 02-08-1951    LOS: 42  REFERRING MD:   EDP - Dr. Preston Fleeting  CHIEF COMPLAINT:  Shortness of breath  BRIEF PATIENT DESCRIPTION: 65 yoM with COPD (3ppd smoking) admitted 1/5 with VDRF 2/2 influenza A and asp PNA, intubated 04/13/11 - 1/15 for AW protection, aspirated during intubation. Reintubated emergently 1/18 for severe delerium & resp distress, purulent secretions noted. Trach 1/22. New PE, DVT BL, s/p IVF filter 1/27. On hep gtt.   LINES / TUBES: ETT 1/5 >>1/15 , 1/18 >> 1/21 self extubated, 1/21 >> 1/22 L IJ 1/5 >> 1/13 R IJ 1/13 >> 1/16 RIJ 1/18 >> 1/28 R A-line 1/5 >>1/8 pulled out by pt PICC 1/28 >> 2/15 (pt pulled it out) Trach 1/22 >>  IVC Filter 1/27 >>   CULTURES: Blood culture 1/5 >> Negative Resp culture 1/6 >> H. influenzae  Resp viral panel 1/6 >> Influenza A and Metapneumovirus  Sputum Leigonella Culture 1/6 >> Negative Bronch Sputum 1/6 >> H. influenzae  Flu 1/5 >> Negative Blood culture 01/13 >> Negative Catheter tip culture 1/13 >> Staph -coag neg-70 colonies C.diff PCR 1/14 >> Negative Resp culture 1/18 >> Negative Blood culture 2/6 >>NTD Urine 2/6 >> Enterobacter aerogenes   ANTIBIOTICS: Zosyn 1/5>>1/9 - restarted 1/22 >>1/28 Azithro 1/5>>1/8  Vanc 1/6>>>1/8 - restarted 1/13 >>1/14 Tamiflu 1/6>>>1/16  Rocephin 1/9>>1/15 Ceftazidine 1/18 >>1/22 Ceftaz 2/6>> 2/15  SIGNIFICANT EVENTS:  1/5 Intubated in ED  1/5 Shock , levophed  1/6 ECHO - 55%, mod dil rv, PA pressures>>> not measured b/c no TR jet  1/6 Bronched- diffuse pus all lobes  1/7 increasing pCO2, increased RR on vent  1/8 Changed sedation to Precedex, more agitated than with versed  1/9 Changed sedation back to Versed, agitation greatly improved  1/10 scheduled haldol  1/12 Restarting precedex gtt due to persistent agitation 1/13 Febrile to 102.6 overnight. ? Catheter infection? DC'ed L IJ, placed new R  IJ. 1/14 No fevers overnight, agitation controlled on propofol (added 1/13), more interactive. 1/15 Had foul smelling stools overnight, therefore, C.diff PCR was sent by RN. 1/15 EXTUBATED, developed A.fib with RVR overnight. 1/16 Persistent episodes of desaturation., afib>started on cardizem drip  1/17 increased agitation  1/18 reintubated  1/20 Persistent A. Fib this morning on dilt drip.  Rates controlled between 99-125 1/21 Self extubated, given a chance and continued to be in respiratory distress so reintubated.  1/22 Tracheostomy, complicated by bleeding, angioedema from protamine 1/23 Neg acute ct head 1/24 PEG placement planned --> delayed bc of ileus 1/25 CTA showing PE BL --> started on heparin gtt. 1/26 BL LE DVT with mobile thrombus 1/30 Tolerated PMSV placement for 15 minutes and fatigue     SUBJECTIVE / INTERVAL HISTORY:  More responsive. Communicative. Uncooperative but pleasant   VITAL SIGNS: Temp:  [97.9 F (36.6 C)-98.2 F (36.8 C)] 98.1 F (36.7 C) (02/16 0758) Pulse Rate:  [87-108] 103 (02/16 0800) Resp:  [19-28] 25 (02/16 0800) BP: (97-136)/(61-93) 136/93 mmHg (02/16 0800) SpO2:  [89 %-97 %] 92 % (02/16 0800) FiO2 (%):  [40 %-50 %] 40 % (02/16 0800) HEMODYNAMICS: VENTILATOR SETTINGS: Vent Mode:  [-]  FiO2 (%):  [40 %-50 %] 40 % INTAKE / OUTPUT:  Intake/Output Summary (Last 24 hours) at 05/24/12 1043 Last data filed at 05/24/12 0900  Gross per 24 hour  Intake   1230 ml  Output    625 ml  Net  605 ml   PHYSICAL EXAMINATION: General: Tracheostomy in place, on ventilator, opens eyes to command and tracks around the room but not following any command.  Head: Normocephalic, Trach in place, no bleeding.  Lungs:  Diminished BS, coarse breath sounds clearing.Marland Kitchen  Heart: Irregularly irregular rhythm, regular rate.  Abdomen:  BS normoactive. Soft, Nondistended, non-tender.  No masses or organomegaly.  Extremities: No pretibial edema.    CBC    Component  Value Date/Time   WBC 22.9* 05/23/2012 0640   RBC 5.22 05/23/2012 0640   HGB 16.0 05/23/2012 0640   HCT 48.4 05/23/2012 0640   PLT 218 05/23/2012 0640   MCV 92.7 05/23/2012 0640   MCH 30.7 05/23/2012 0640   MCHC 33.1 05/23/2012 0640   RDW 16.6* 05/23/2012 0640   LYMPHSABS 2.1 04/24/2012 0530   MONOABS 1.5* 04/24/2012 0530   EOSABS 0.0 04/24/2012 0530   BASOSABS 0.0 04/24/2012 0530   BMET    Component Value Date/Time   NA 144 05/23/2012 0640   K 4.3 05/23/2012 0640   CL 104 05/23/2012 0640   CO2 25 05/23/2012 0640   GLUCOSE 136* 05/23/2012 0640   BUN 20 05/23/2012 0640   CREATININE 0.76 05/23/2012 0640   CALCIUM 10.2 05/23/2012 0640   GFRNONAA >90 05/23/2012 0640   GFRAA >90 05/23/2012 0640       DIAGNOSES: Principal Problem:   Acute respiratory failure with hypercapnia Active Problems:   HYPERLIPIDEMIA   TOBACCO USE   HYPERTENSION   GERD   COPD (chronic obstructive pulmonary disease)   Acute encephalopathy   Acute kidney injury   Atrial fibrillation   PE (pulmonary embolism)   Tracheostomy status  ASSESSMENT / PLAN:  PULMONARY A: 1) Acute hypercapnic respiratory failure/ARDS - secondary to flu, COPD exacerbation. Trach 1/22. 2) Aspiration PNA - at time of intubation, resolved 4) Pulmonary edema - improved. 4) Non-oxygen dependent COPD -  6) Upper airway severe angioedema & rash from protamine   P:  - Cont current Rx   CARDIOVASCULAR  A:  1) New onset Afib with RVR - resolved 2/01 2) Hypotension - resolved 3) Anaphylaxis to protamine - resolved 4) Hypertension P:  - D/C amiodarone 2/16 - Cont diltiazem   RENAL  A:  1) Acute Kidney injury - Resolved.  2) Hypernatremia - resolved  3) Hypokalemia - resolved  P:  - Cont current Rx and monitoring   GASTROINTESTINAL No results found for this basename: AST, ALT, ALKPHOS, BILITOT, PROT, ALBUMIN,  in the last 168 hours A:  1) H/O GERD 2) Diarrhea - resolved.  3) Colonic Ileus > resolved 4) Dysphagia P: -  Continue TF, Pepcid. - Minimize narcotics. - PEG in place, continue using. - Follow SLP recs  HEMATOLOGIC  A:  4) Bilateral PE 5) Bilateral DVT - IVC filter placement 1/27 P:  - Cont warfarin  INFECTIOUS  A: 1) Flu pneumonia - resolved 2) Aspirated at time of intubation, pneumonitis vs PNA - resolved 3) CVL infection - resolved 4) Diarrhea - resolved. 5) Leukocytosis - likely 2/2 steroids. D/C prednisone P:  Monitor off abx   ENDOCRINE  A:  1) No prior hx of DM 2) mild hyperglycemia - Monitor CBGs - Resume SSI if > 180  NEUROLOGIC / PSYCHIATRIC   A:  1) Delirium, improved  P:  - Cont current Rx - Mobilize  Billy Fischer, MD ; [facility] service Mobile [phone].  After 5:30 PM or weekends, call [phone]

## 2012-05-24 NOTE — Progress Notes (Signed)
ANTICOAGULATION CONSULT NOTE - Follow Up Consult  Pharmacy Consult for warfarin Indication: pulmonary embolus and DVT  Allergies  Allergen Reactions  . Protamine Anaphylaxis    Patient Measurements: Height: 5\' 10"  (177.8 cm) Weight: 170 lb 6.7 oz (77.3 kg) IBW/kg (Calculated) : 73  Vital Signs: Temp: 98.1 F (36.7 C) (02/16 0758) Temp src: Oral (02/16 0758) BP: 136/93 mmHg (02/16 0800) Pulse Rate: 103 (02/16 0800)  Labs:  Recent Labs  05/22/12 0500 05/23/12 0640 05/24/12 0615  HGB 14.7 16.0  --   HCT 44.5 48.4  --   PLT 193 218  --   LABPROT 15.5* 22.5* 19.4*  INR 1.25 2.08* 1.70*  CREATININE 0.58 0.76  --     Estimated Creatinine Clearance: 97.6 ml/min (by C-G formula based on Cr of 0.76).   Medications:  Scheduled:  . amiodarone  400 mg Oral Daily  . antiseptic oral rinse  15 mL Mouth Rinse QID  . chlorhexidine  15 mL Mouth Rinse BID  . clonazePAM  1 mg Per Tube BID  . diltiazem  30 mg Per Tube Q6H  . famotidine (PEPCID) IV  20 mg Intravenous Daily  . free water  100 mL Per Tube Q8H  . ipratropium  0.5 mg Nebulization Q6H  . levalbuterol  0.63 mg Nebulization Q6H  . multivitamin  5 mL Per Tube Daily  . predniSONE  10 mg Per Tube Daily  . QUEtiapine  100 mg Per Tube QHS  . [COMPLETED] warfarin  3 mg Oral ONCE-1800  . Warfarin - Pharmacist Dosing Inpatient   Does not apply q1800  . [DISCONTINUED] cefTAZidime (FORTAZ)  IV  1 g Intravenous Q8H  . [DISCONTINUED] haloperidol lactate  5 mg Intravenous Q6H  . [DISCONTINUED] insulin aspart  0-15 Units Subcutaneous Q4H  . [DISCONTINUED] predniSONE  20 mg Per Tube Daily  . [DISCONTINUED] sodium chloride  10-40 mL Intracatheter Q12H   Infusions:  . sodium chloride 10 mL/hr at 05/19/12 1359  . dextrose 5 % and 0.45% NaCl 20 mL/hr at 05/19/12 1311  . feeding supplement (JEVITY 1.2 CAL) 1,000 mL (05/24/12 0536)  . [DISCONTINUED] feeding supplement (JEVITY 1.2 CAL) 20 mL/hr (05/23/12 5621)  . [DISCONTINUED]  lactated ringers 20 mL/hr at 05/19/12 3086    Assessment: 62 yo male with new PE/DVT (s/p IVC filter on 1/27). He has a trach. Large INR jump 1.25>2.02 after warfarin 7.5mg  x1 and 5mg  x1. However, INR decreased this morning to 1.7 after a 3mg  dose. Note, patient is also on amiodarone which can enhance the anticoagulant effect of warfarin.  Platelets are WNL. No bleeding noted.   Goal of Therapy:  INR 2-3 Monitor platelets by anticoagulation protocol: Yes   Plan:  1. Warfarin 5 mg PO x 1 tonight  2. Daily PT/INR  3. Monitor for bleeding complications.  Lillia Pauls, PharmD Clinical Pharmacist Pager: [phone] Phone: [phone] 05/24/2012 10:39 AM

## 2012-05-25 LAB — GLUCOSE, CAPILLARY
Glucose-Capillary: 136 mg/dL — ABNORMAL HIGH (ref 70–99)
Glucose-Capillary: 142 mg/dL — ABNORMAL HIGH (ref 70–99)

## 2012-05-25 LAB — CBC
HCT: 46.9 % (ref 39.0–52.0)
Hemoglobin: 15.4 g/dL (ref 13.0–17.0)
MCV: 92.7 fL (ref 78.0–100.0)
RBC: 5.06 MIL/uL (ref 4.22–5.81)
RDW: 16.8 % — ABNORMAL HIGH (ref 11.5–15.5)
WBC: 16.7 10*3/uL — ABNORMAL HIGH (ref 4.0–10.5)

## 2012-05-25 LAB — BASIC METABOLIC PANEL
CO2: 32 mEq/L (ref 19–32)
Chloride: 104 mEq/L (ref 96–112)
Creatinine, Ser: 0.86 mg/dL (ref 0.50–1.35)
GFR calc Af Amer: >90 mL/min (ref >90)
Potassium: 3.5 mEq/L (ref 3.5–5.1)
Sodium: 147 mEq/L — ABNORMAL HIGH (ref 135–145)

## 2012-05-25 LAB — PROTIME-INR
INR: 1.42 (ref 0.00–1.49)
Prothrombin Time: 17 seconds — ABNORMAL HIGH (ref 11.6–15.2)

## 2012-05-25 MED ORDER — POTASSIUM CHLORIDE 20 MEQ/15ML (10%) PO LIQD
40.0000 meq | Freq: Two times a day (BID) | ORAL | Status: AC
  Administered 2012-05-25 (×2): 40 meq
  Filled 2012-05-25 (×2): qty 30

## 2012-05-25 MED ORDER — POTASSIUM CHLORIDE 20 MEQ/15ML (10%) PO LIQD
ORAL | Status: AC
  Administered 2012-05-25: 40 meq
  Filled 2012-05-25: qty 30

## 2012-05-25 MED ORDER — WARFARIN SODIUM 7.5 MG PO TABS
7.5000 mg | ORAL_TABLET | Freq: Once | ORAL | Status: AC
  Administered 2012-05-25: 7.5 mg via ORAL
  Filled 2012-05-25: qty 1

## 2012-05-25 MED ORDER — FUROSEMIDE 10 MG/ML IJ SOLN
40.0000 mg | Freq: Once | INTRAMUSCULAR | Status: AC
  Administered 2012-05-25: 40 mg via INTRAVENOUS
  Filled 2012-05-25: qty 4

## 2012-05-25 MED ORDER — FREE WATER
200.0000 mL | Freq: Three times a day (TID) | Status: DC
  Administered 2012-05-25 – 2012-05-26 (×2): 200 mL

## 2012-05-25 MED ORDER — QUETIAPINE FUMARATE 100 MG PO TABS
100.0000 mg | ORAL_TABLET | Freq: Two times a day (BID) | ORAL | Status: DC
  Administered 2012-05-25 – 2012-05-29 (×8): 100 mg
  Filled 2012-05-25 (×9): qty 1

## 2012-05-25 MED ORDER — DILTIAZEM 12 MG/ML ORAL SUSPENSION
30.0000 mg | Freq: Three times a day (TID) | ORAL | Status: DC
  Administered 2012-05-25 – 2012-05-26 (×2): 30 mg
  Filled 2012-05-25 (×5): qty 3

## 2012-05-25 NOTE — Progress Notes (Signed)
Pt had a good night and rested well; awake around 4:30am; trach care completed; talked with valve on trach; appeared to be making sense and being cooperative; trialed pt  With one mitt on; stated he wanted the right mitt off to write note when doctor came by; soft wrist restraints remained on and tied; pt almost pulled out trach; states "he didn't do it"; mitts and soft wrist restraints back on; side rails up because he states he is "leaving"; will continue to monitor

## 2012-05-25 NOTE — Progress Notes (Addendum)
ANTICOAGULATION CONSULT NOTE - Follow Up Consult  Pharmacy Consult for warfarin Indication: pulmonary embolus and DVT  Allergies  Allergen Reactions  . Protamine Anaphylaxis    Patient Measurements: Height: 5\' 10"  (177.8 cm) Weight: 170 lb 6.7 oz (77.3 kg) IBW/kg (Calculated) : 73  Vital Signs: Temp: 99.5 F (37.5 C) (02/17 0808) Temp src: Oral (02/17 0808) BP: 123/95 mmHg (02/17 0808) Pulse Rate: 96 (02/17 0808)  Labs:  Recent Labs  05/23/12 0640 05/24/12 0615 05/25/12 0555  HGB 16.0  --  15.4  HCT 48.4  --  46.9  PLT 218  --  209  LABPROT 22.5* 19.4* 17.0*  INR 2.08* 1.70* 1.42  CREATININE 0.76  --  0.86    Estimated Creatinine Clearance: 90.8 ml/min (by C-G formula based on Cr of 0.86).   Medications:  Scheduled:  . antiseptic oral rinse  15 mL Mouth Rinse QID  . chlorhexidine  15 mL Mouth Rinse BID  . clonazePAM  1 mg Per Tube BID  . diltiazem  30 mg Per Tube Q6H  . famotidine (PEPCID) IV  20 mg Intravenous Daily  . free water  100 mL Per Tube Q8H  . ipratropium  0.5 mg Nebulization Q6H  . levalbuterol  0.63 mg Nebulization Q6H  . multivitamin  5 mL Per Tube Daily  . QUEtiapine  100 mg Per Tube QHS  . [COMPLETED] warfarin  5 mg Oral ONCE-1800  . warfarin  7.5 mg Oral ONCE-1800  . Warfarin - Pharmacist Dosing Inpatient   Does not apply q1800  . [DISCONTINUED] amiodarone  400 mg Oral Daily  . [DISCONTINUED] predniSONE  10 mg Per Tube Daily   Infusions:  . sodium chloride 10 mL/hr at 05/19/12 1359  . feeding supplement (JEVITY 1.2 CAL) 1,000 mL (05/25/12 0516)  . [DISCONTINUED] dextrose 5 % and 0.45% NaCl 20 mL/hr at 05/19/12 1311    Assessment: 62 yo male with new PE/DVT (s/p IVC filter on 1/27). He has a trach. INR now subtherapeutic, has been labile on Coumadin day #5. Of note, amiodarone d/c'd 2/16.  Platelets are WNL. No bleeding noted.   Goal of Therapy:  INR 2-3 Monitor platelets by anticoagulation protocol: Yes   Plan:  1. Warfarin  7.5 mg PO x 1 tonight. 2. Daily PT/INR  3. Monitor for bleeding complications.  Tad Moore, BCPS  Clinical Pharmacist Pager [phone]  05/25/2012 9:00 AM

## 2012-05-25 NOTE — Progress Notes (Signed)
PULMONARY  / CRITICAL CARE MEDICINE  Name: Cody [name] MRN: [id number] DOB: 09/29/1950    LOS: 61  REFERRING MD:   EDP - Dr. Preston Fleeting  CHIEF COMPLAINT:  Shortness of breath  BRIEF PATIENT DESCRIPTION: 74 yoM with COPD (3ppd smoking) admitted 1/5 with VDRF 2/2 influenza A and asp PNA, intubated 04/13/11 - 1/15 for AW protection, aspirated during intubation. Reintubated emergently 1/18 for severe delerium & resp distress, purulent secretions noted. Trach 1/22. New PE, DVT BL, s/p IVF filter 1/27. On hep gtt.   LINES / TUBES: ETT 1/5 >>1/15 , 1/18 >> 1/21 self extubated, 1/21 >> 1/22 L IJ 1/5 >> 1/13 R IJ 1/13 >> 1/16 RIJ 1/18 >> 1/28 R A-line 1/5 >>1/8 pulled out by pt PICC 1/28 >> 2/15 (pt pulled it out) Trach 1/22 >>  IVC Filter 1/27 >>   CULTURES: Blood culture 1/5 >> Negative Resp culture 1/6 >> H. influenzae  Resp viral panel 1/6 >> Influenza A and Metapneumovirus  Sputum Leigonella Culture 1/6 >> Negative Bronch Sputum 1/6 >> H. influenzae  Flu 1/5 >> Negative Blood culture 01/13 >> Negative Catheter tip culture 1/13 >> Staph -coag neg-70 colonies C.diff PCR 1/14 >> Negative Resp culture 1/18 >> Negative Blood culture 2/6 >>NTD Urine 2/6 >> Enterobacter aerogenes   ANTIBIOTICS: Zosyn 1/5>>1/9 - restarted 1/22 >>1/28 Azithro 1/5>>1/8  Vanc 1/6>>>1/8 - restarted 1/13 >>1/14 Tamiflu 1/6>>>1/16  Rocephin 1/9>>1/15 Ceftazidine 1/18 >>1/22 Ceftaz 2/6>> 2/15  SIGNIFICANT EVENTS:  1/5 Intubated in ED  1/5 Shock , levophed  1/6 ECHO - 55%, mod dil rv, PA pressures>>> not measured b/c no TR jet  1/6 Bronched- diffuse pus all lobes  1/7 increasing pCO2, increased RR on vent  1/8 Changed sedation to Precedex, more agitated than with versed  1/9 Changed sedation back to Versed, agitation greatly improved  1/10 scheduled haldol  1/12 Restarting precedex gtt due to persistent agitation 1/13 Febrile to 102.6 overnight. ? Catheter infection? DC'ed L IJ, placed new R  IJ. 1/14 No fevers overnight, agitation controlled on propofol (added 1/13), more interactive. 1/15 Had foul smelling stools overnight, therefore, C.diff PCR was sent by RN. 1/15 EXTUBATED, developed A.fib with RVR overnight. 1/16 Persistent episodes of desaturation., afib>started on cardizem drip  1/17 increased agitation  1/18 reintubated  1/20 Persistent A. Fib this morning on dilt drip.  Rates controlled between 99-125 1/21 Self extubated, given a chance and continued to be in respiratory distress so reintubated.  1/22 Tracheostomy, complicated by bleeding, angioedema from protamine 1/23 Neg acute ct head 1/24 PEG placement planned --> delayed bc of ileus 1/25 CTA showing PE BL --> started on heparin gtt. 1/26 BL LE DVT with mobile thrombus 1/30 Tolerated PMSV placement for 15 minutes and fatigue     SUBJECTIVE / INTERVAL HISTORY:  Remains intermittently agitated and uncooperative   VITAL SIGNS: Temp:  [98.1 F (36.7 C)-99.5 F (37.5 C)] 98.9 F (37.2 C) (02/17 1953) Pulse Rate:  [83-110] 102 (02/17 2000) Resp:  [20-31] 23 (02/17 2000) BP: (103-131)/(69-95) 123/90 mmHg (02/17 2000) SpO2:  [91 %-96 %] 95 % (02/17 2000) FiO2 (%):  [35 %-60 %] 40 % (02/17 2000) HEMODYNAMICS: VENTILATOR SETTINGS: Vent Mode:  [-]  FiO2 (%):  [35 %-60 %] 40 % INTAKE / OUTPUT:  Intake/Output Summary (Last 24 hours) at 05/25/12 2036 Last data filed at 05/25/12 2000  Gross per 24 hour  Intake   1360 ml  Output   1151 ml  Net    209 ml  PHYSICAL EXAMINATION: Still on 60% TC No resp distress RASS 0 Intermittently agitated and uncooperative but not unpleasant HEENT WNL No JVD No wheezes RRR s M NABS No edema No focal deficits  CBC    Component Value Date/Time   WBC 16.7* 05/25/2012 0555   RBC 5.06 05/25/2012 0555   HGB 15.4 05/25/2012 0555   HCT 46.9 05/25/2012 0555   PLT 209 05/25/2012 0555   MCV 92.7 05/25/2012 0555   MCH 30.4 05/25/2012 0555   MCHC 32.8 05/25/2012 0555   RDW  16.8* 05/25/2012 0555   LYMPHSABS 2.1 04/24/2012 0530   MONOABS 1.5* 04/24/2012 0530   EOSABS 0.0 04/24/2012 0530   BASOSABS 0.0 04/24/2012 0530   BMET    Component Value Date/Time   NA 147* 05/25/2012 0555   K 3.5 05/25/2012 0555   CL 104 05/25/2012 0555   CO2 32 05/25/2012 0555   GLUCOSE 141* 05/25/2012 0555   BUN 27* 05/25/2012 0555   CREATININE 0.86 05/25/2012 0555   CALCIUM 10.2 05/25/2012 0555   GFRNONAA >90 05/25/2012 0555   GFRAA >90 05/25/2012 0555    DIAGNOSES: Principal Problem:   Acute respiratory failure with hypercapnia Active Problems:   HYPERLIPIDEMIA   TOBACCO USE   HYPERTENSION   GERD   COPD (chronic obstructive pulmonary disease)   Acute encephalopathy   Acute kidney injury   Atrial fibrillation, resolved   PE (pulmonary embolism)   Tracheostomy status   Hypernatremia   Refractory hypoxemia  PLAN:  PULMONARY A: 1) Acute hypercapnic respiratory failure/ARDS - secondary to flu, COPD exacerbation. Trach 1/22. 2) Aspiration PNA - at time of intubation, resolved 4) Pulmonary edema - improved. 4) Non-oxygen dependent COPD -  6) Upper airway severe angioedema & rash from protamine, resolved  P:  - Cont current Rx - Trial of Lasix to see if it improves gas exchange   CARDIOVASCULAR  A:  1) New onset Afib with RVR - resolved 2/01 2) Hypotension - resolved 3) Anaphylaxis to protamine - resolved 4) Hypertension P:  - D/C amiodarone 2/16 - decrease diltiazem   RENAL  A:  1) Acute Kidney injury - Resolved.  2) Hypernatremia - resolved  3) Hypokalemia - resolved  P:  - Cont current Rx and monitoring - Increase free H2O  GASTROINTESTINAL  A:  1) H/O GERD 2) Diarrhea - resolved.  3) Colonic Ileus > resolved 4) Dysphagia P: - Continue TF, Pepcid. - Minimize narcotics. - Follow SLP recs  HEMATOLOGIC  A:  4) Bilateral PE 5) Bilateral DVT - IVC filter placement 1/27 P:  - Cont warfarin  INFECTIOUS  A: 1) Flu pneumonia - resolved 2)  Aspirated at time of intubation, pneumonitis vs PNA - resolved 3) CVL infection - resolved 4) Diarrhea - resolved. 5) Leukocytosis - likely 2/2 steroids. D/C prednisone P:  Monitor off abx   ENDOCRINE  A:  1) No prior hx of DM 2) mild hyperglycemia - Monitor CBGs - Resume SSI if > 180  NEUROLOGIC / PSYCHIATRIC   A:  1) Delirium, agitated 2) Deconditioning  P:  - Increase seroquel - Mobilize  Too unpredictable to be able to move out of SDU Will need LTACH when O2 reqts improve sufficiently   Billy Fischer, MD ; [facility] service [place] [phone].  After 5:30 PM or weekends, call [phone]

## 2012-05-25 NOTE — Progress Notes (Signed)
eLink Physician-Brief Progress Note Patient Name: Cody [name] DOB: 05-03-1950 MRN: [id number]  Date of Service  05/25/2012   HPI/Events of Note     eICU Interventions  RESTRAINTS RENEWED UNTIL 09:00 AM 2/18      Cody [name] S. 05/25/2012, 7:16 PM

## 2012-05-25 NOTE — Progress Notes (Signed)
Speech Language Pathology Dysphagia Treatment Patient Details Name: Cody [name] MRN: [id number] DOB: [birth year] Today's Date: 05/25/2012 Time: 4401-0272 SLP Time Calculation (min): 22 min  Assessment / Plan / Recommendation Clinical Impression  Treatment focused on dysphagia and PMSV.  Oral cavity cleaned and SLP donned speaking valve.  Exhibited decreased respiratory strength resulting in breathy/whisper vocal qualtiy majority of session.  Max verbal and visual cues provided for deep inhalation prior to speech which pt. attempted, however unsuccessful in providing upper airway with adequate airflow for phonation.  Pt. fed himseld bites applesauce with mod-max verbal cues to swallow total of 3 times in attempt to clear pharyngeal residue.  One delayed cough during session with possible penetration into laryngeal vestibule.  Sp02 dipped into high 80's and pt. took deep breaths (with cues) to bring sats into 90's.  Attempted additional laryngeal elevation exercise (high pitched /e/) with mild sucess.  Pt. cooperative and motivated to improve.  SLP encouraged pt. to practice swallowing his saliva as hard as he is able.  Pt. coughing post session, RN deep suctioned without clear evidence of applesauce suctioned from trach.  Pt. was also able to cough mucous into oral cavity.  ST continue to follow.     Diet Recommendation       SLP Plan     Pertinent Vitals/Pain none   Swallowing Goals  SLP Swallowing Goals Goal #3: Pt. will perform exercises for improved tongue base and laryngeal ROM and strength with max visual/verbal/tactile cues. Swallow Study Goal #3 - Progress: Progressing toward goal Goal #4: Pt. will consume trials of puree only with SLP for increased strength during treatment with max verbal/visual cues. Swallow Study Goal #4 - Progress: Progressing toward goal  General Temperature Spikes Noted: No Respiratory Status: Trach Behavior/Cognition: Alert;Cooperative;Requires  cueing Oral Cavity - Dentition: Missing dentition Patient Positioning: Upright in bed  Oral Cavity - Oral Hygiene Does patient have any of the following "at risk" factors?: Oxygen therapy - cannula, mask, simple oxygen devices Brush patient's teeth BID with toothbrush (using toothpaste with fluoride): Yes Patient is HIGH RISK - Oral Care Protocol followed (see row info): Yes   Dysphagia Treatment Treatment focused on:  (trials applesauce) Treatment Methods/Modalities: Effortful swallow Patient observed directly with PO's: Yes Type of PO's observed: Dysphagia 1 (puree) (applesauce) Feeding: Able to feed self;Needs set up Pharyngeal Phase Signs & Symptoms: Delayed cough Type of cueing: Verbal;Visual Amount of cueing: Maximal   GO     Cody [name] [employer] [phone]  05/25/2012

## 2012-05-25 NOTE — Progress Notes (Signed)
Occupational Therapy Treatment Patient Details Name: Cody [name] MRN: [id number] DOB: 10-08-1950 Today's Date: 05/25/2012 Time: 4098-1191 OT Time Calculation (min): 41 min  OT Assessment / Plan / Recommendation Comments on Treatment Session Pt able to tolerate standing for intervals of 1-2 mins with mod assist for standing balance at edge of bed.  HR increased to 135 and O2 sats 87% on 10Ls trach collar.  Still needs lots of work on endurance, balance, activity tolerance, and safety.  Will benefit from more intense therapy at CIR level.    Follow Up Recommendations  CIR       Equipment Recommendations  3 in 1 bedside comode;Tub/shower bench    Recommendations for Other Services Rehab consult  Frequency Min 2X/week   Plan Discharge plan remains appropriate    Precautions / Restrictions Precautions Precautions: Fall Precaution Comments: trach, PEG Restrictions Weight Bearing Restrictions: No   Pertinent Vitals/Pain O2 decreased to 87% on 10 Ls trach collar, HR increased to 135 with activity    ADL  Lower Body Dressing: Performed;Minimal assistance;Other (comment) (for donning gripper socks only) Where Assessed - Lower Body Dressing: Unsupported sitting Toilet Transfer: Simulated;Moderate assistance Toilet Transfer Method: Stand pivot Toileting - Clothing Manipulation and Hygiene: Moderate assistance Where Assessed - Toileting Clothing Manipulation and Hygiene: Sit to stand from 3-in-1 or toilet Transfers/Ambulation Related to ADLs: Pt requires mod assist for sit to stand and to take a few small steps up the edge of the bed or out in front away from the bed. ADL Comments: Pt able to transfer and sit EOB for 7 mins with min guard assist while using bilateral UEs to donn gripper socks.  Performed sit to stand x3 for 1-2 minute intervals, including stepping.  HR increased to 135 with standing and O2 decreased to 87% on 10Ls at 60% trach collar      OT Goals Acute Rehab OT  Goals Time For Goal Achievement: 06/08/12 Potential to Achieve Goals: Good ADL Goals Pt Will Perform Grooming: with min assist;Standing at sink;Other (comment) (3 tasks) ADL Goal: Grooming - Progress: Revised due to lack of progress Pt Will Perform Upper Body Bathing: with set-up;Unsupported;Sitting, edge of bed ADL Goal: Upper Body Bathing - Progress: Revised due to lack of progress Pt Will Perform Lower Body Bathing: with min assist;Sit to stand from bed;Supported ADL Goal: Lower Body Bathing - Progress: Revised due to lack of progress Pt Will Perform Lower Body Dressing: with min assist;Sit to stand from bed;Supported ADL Goal: Lower Body Dressing - Progress: Revised due to lack of progress Pt Will Transfer to Toilet: with supervision;3-in-1;with DME ADL Goal: Toilet Transfer - Progress: Revised due to lack of progress Pt Will Perform Toileting - Clothing Manipulation: with supervision;with adaptive equipment;Sitting on 3-in-1 or toilet ADL Goal: Toileting - Clothing Manipulation - Progress: Goal set today Miscellaneous OT Goals OT Goal: Miscellaneous Goal #1 - Progress: Discontinued (comment) Miscellaneous OT Goal #2: Pt will transfer from supine to sit EOB with supervision in preparation for selfcare tasks. OT Goal: Miscellaneous Goal #2 - Progress: Updated due to goal met OT Goal: Miscellaneous Goal #3 - Progress: Met  Visit Information  Last OT Received On: 05/25/12 Assistance Needed: +2    Subjective Data  Subjective: Why can't I have anything to drink. Patient Stated Goal: Wants to be able to drink, and not have all the lines and tubes.      Cognition  Cognition Overall Cognitive Status: Impaired Area of Impairment: Memory Difficult to assess due to: Tracheostomy Arousal/Alertness:  Awake/alert Orientation Level: Person;Place Behavior During Session: [facility] for tasks performed Current Attention Level: Sustained Following Commands: Follows multi-step commands with  increased time Safety/Judgement: Impulsive Safety/Judgement - Other Comments: Pt not understanding why he cannot have water.    Mobility  Bed Mobility Bed Mobility: Rolling Left;Left Sidelying to Sit;Sit to Supine Rolling Right: 4: Min assist;With rail Supine to Sit: 4: Min assist;HOB flat;With rails Sit to Supine: 4: Min assist;HOB flat       Balance Balance Balance Assessed: Yes Static Sitting Balance Static Sitting - Balance Support: No upper extremity supported Static Sitting - Level of Assistance: 5: Stand by assistance Static Standing Balance Static Standing - Balance Support: Right upper extremity supported;Left upper extremity supported Static Standing - Level of Assistance: 3: Mod assist Dynamic Standing Balance Dynamic Standing - Balance Support: Right upper extremity supported;Left upper extremity supported Dynamic Standing - Level of Assistance: 2: Max assist   End of Session OT - End of Session Activity Tolerance: Patient limited by fatigue Patient left: in bed;with call bell/phone within reach;with bed alarm set;with family/visitor present Nurse Communication: Mobility status     [name] OTR/L Pager number [phone] 05/25/2012, 1:07 PM

## 2012-05-25 NOTE — Progress Notes (Signed)
Physical Therapy Treatment Patient Details Name: Cody [name] MRN: [id number] DOB: [birth year] Today's Date: 05/25/2012 Time: 1529-1600 PT Time Calculation (min): 31 min  PT Assessment / Plan / Recommendation Comments on Treatment Session  Adm. SOB and resp. failure due to flu and pna with VDRF 1/5-1/15, 1/18-1/21 with trach and bronchoscopy 1/22. Making steady progress with every session. Able to tolerate 3 separate therapy sessions today. Stood x3 today and walked 24 feet modA. Making much more sense today but still with decreased insight into his deficits especially swallowing needing several reminders throughout session about his PEG and why swallowing is not safe right now unless with speech. Still strongly recommending CIR for continued intensive therapy.     Follow Up Recommendations  CIR     Does the patient have the potential to tolerate intense rehabilitation     Barriers to Discharge        Equipment Recommendations  Rolling walker with 5" wheels    Recommendations for Other Services    Frequency Min 3X/week   Plan Discharge plan remains appropriate;Frequency remains appropriate    Precautions / Restrictions Precautions Precautions: Fall Precaution Comments: trach, PEG Restrictions Weight Bearing Restrictions: No   Pertinent Vitals/Pain Trach 40%, SpO2 dropped to 88% slightly during ambulation but difficult to tell if it was because he was squeezing RW as it bounced back to 99% immediately upon sittig and letting go of walker; HR elevated 115 to start sitting EOB and up to 130s with ambulation only coming down to 120s after a minute of rest    Mobility  Bed Mobility Bed Mobility: Rolling Left;Sit to Sidelying Left;Scooting to [facility];Rolling Right Rolling Right: 4: Min assist;With rail Rolling Left: 4: Min assist Left Sidelying to Sit: 4: Min assist Supine to Sit: 4: Min assist;HOB flat;With rails Sitting - Scoot to Edge of Bed: 4: Min assist Sit to Supine: 4: Min  assist;HOB flat Sit to Sidelying Left: 3: Mod assist Scooting to HOB: 1: +2 Total assist Scooting to [facility]: Patient Percentage: 40% Details for Bed Mobility Assistance: v/c for sequencing and min-mod facilitation for follow through and reminders of how to move requring a little more time; mod facilitation to bring legs to bed level but pt able to help with bridging during scooting HOB today Transfers Transfers: Sit to Stand;Stand to Sit (3 trials) Sit to Stand: 3: Mod assist;With upper extremity assist;With armrests;From chair/3-in-1;From bed Stand to Sit: 3: Mod assist;With upper extremity assist;With armrests;To chair/3-in-1;To bed Details for Transfer Assistance: reminders for safe hand placement, facilitation to bring trunk over BOS and stabilize during standing Ambulation/Gait Ambulation/Gait Assistance: 4: Min assist;3: Mod assist Ambulation Distance (Feet): 24 Feet Assistive device: Rolling walker Ambulation/Gait Assistance Details: occasionally needing mod facilitation for stability as pt very unsteady even with RW given his weakness; cues for tall posture with forward gaze as well as to increase his BOS for improved stability; sat after about 12 ft for rest as HR elevated to 130s Gait Pattern: Trunk flexed;Narrow base of support;Decreased stride length General Gait Details: decreased step height      PT Goals Acute Rehab PT Goals PT Goal Formulation: With patient Time For Goal Achievement: 05/29/12 Potential to Achieve Goals: Good PT Goal: Rolling Supine to Right Side - Progress: Progressing toward goal PT Goal: Rolling Supine to Left Side - Progress: Progressing toward goal PT Goal: Supine/Side to Sit - Progress: Progressing toward goal PT Goal: Sit to Supine/Side - Progress: Progressing toward goal PT Goal: Sit to Stand - Progress:  Progressing toward goal PT Goal: Stand to Sit - Progress: Progressing toward goal PT Transfer Goal: Bed to Chair/Chair to Bed - Progress:  Progressing toward goal PT Goal: Stand - Progress: Progressing toward goal PT Goal: Ambulate - Progress: Progressing toward goal  Visit Information  Last PT Received On: 05/25/12 Assistance Needed: +2    Subjective Data  Subjective: "I don't think I could have done that one more time." Patient Stated Goal: food   Cognition  Cognition Overall Cognitive Status: Impaired Area of Impairment: Memory;Attention;Safety/judgement Difficult to assess due to: Tracheostomy Arousal/Alertness: Awake/alert Orientation Level: Appears intact for tasks assessed Behavior During Session: [facility] for tasks performed Current Attention Level: Selective Following Commands: Follows one step commands consistently;Follows multi-step commands with increased time Safety/Judgement: Impulsive;Decreased awareness of need for assistance Safety/Judgement - Other Comments: needs several reminders of why he can't have food or anythign to drink    Balance  Balance Balance Assessed: Yes Static Sitting Balance Static Sitting - Balance Support: No upper extremity supported Static Sitting - Level of Assistance: 5: Stand by assistance Static Standing Balance Static Standing - Balance Support: Right upper extremity supported;Left upper extremity supported Static Standing - Level of Assistance: 3: Mod assist Dynamic Standing Balance Dynamic Standing - Balance Support: Right upper extremity supported;Left upper extremity supported Dynamic Standing - Level of Assistance: 2: Max assist  End of Session PT - End of Session Equipment Utilized During Treatment: Gait belt Activity Tolerance: Patient tolerated treatment well Patient left: in bed;with call bell/phone within reach;with bed alarm set;with restraints reapplied   GP     [facility] HELEN 05/25/2012, 4:51 PM

## 2012-05-25 NOTE — Progress Notes (Signed)
NUTRITION FOLLOW UP  Intervention:   1. Recommend increase Jevity 1.2 by 10 ml q 4 hr to a new goal rate of 60 ml/hr. Recommend add 30 ml Pro-stat TID. This EN regimen would provide 2028 kcal, 125 gm protein, and 1162 ml free water.   2. RD will continue to follow    Nutrition Dx:   Inadequate oral intake, ongoing   Goal:   Intake to meet >/=90% estimated nutrition needs. Unmet   Monitor:   Diet advance, TF regimen, weight trends, I/O's  Assessment:   PEG in place, currently with Jevity 1.2 infusing at this time at 40 ml/hr. Currently this provides 1152 kcal, 53 gm protein, and 774 ml free water. 100 ml free water TID, total water 1074 ml.  Pt had MBS on 2/14--> recommending continue NPO status.   Recommend increase TF to meet >/=90% estimated nutrition needs until able to tolerate PO intake.   Height: Ht Readings from Last 1 Encounters:  09/19/10 5' 10.5" (1.791 m)    Weight Status:   Wt Readings from Last 1 Encounters:  05/23/12 170 lb 6.7 oz (77.3 kg)  Trending down. Admission weight 231 lbs.    Re-estimated needs:  Kcal: 2000-2200 Protein: 125-135 gm  Fluid: >/=1.2 L/day  Skin: stage I, sacrum   Diet Order:     Intake/Output Summary (Last 24 hours) at 05/25/12 1213 Last data filed at 05/25/12 1100  Gross per 24 hour  Intake   1470 ml  Output   1350 ml  Net    120 ml    Last BM: 2/17   Labs:   Recent Labs Lab 05/21/12 0505 05/22/12 0500 05/23/12 0640 05/25/12 0555  NA 144 143 144 147*  K 3.3* 3.1* 4.3 3.5  CL 101 101 104 104  CO2 32 31 25 32  BUN 17 16 20  27*  CREATININE 0.51 0.58 0.76 0.86  CALCIUM 9.5 9.9 10.2 10.2  MG 2.0 2.0 1.9  --   PHOS 3.6 4.2 4.2  --   GLUCOSE 112* 128* 136* 141*    CBG (last 3)   Recent Labs  05/24/12 1551 05/24/12 2209 05/25/12 0811  GLUCAP 172* 149* 136*    Scheduled Meds: . antiseptic oral rinse  15 mL Mouth Rinse QID  . chlorhexidine  15 mL Mouth Rinse BID  . clonazePAM  1 mg Per Tube BID  .  diltiazem  30 mg Per Tube Q6H  . famotidine (PEPCID) IV  20 mg Intravenous Daily  . free water  100 mL Per Tube Q8H  . ipratropium  0.5 mg Nebulization Q6H  . levalbuterol  0.63 mg Nebulization Q6H  . multivitamin  5 mL Per Tube Daily  . QUEtiapine  100 mg Per Tube QHS  . warfarin  7.5 mg Oral ONCE-1800  . Warfarin - Pharmacist Dosing Inpatient   Does not apply q1800    Continuous Infusions: . sodium chloride 10 mL/hr at 05/19/12 1359  . feeding supplement (JEVITY 1.2 CAL) 1,000 mL (05/25/12 0516)    Clarene Duke RD, LDN Pager [phone] After Hours pager [phone]

## 2012-05-26 ENCOUNTER — Inpatient Hospital Stay (HOSPITAL_COMMUNITY): Payer: Medicaid Other

## 2012-05-26 LAB — BASIC METABOLIC PANEL
CO2: 33 mEq/L — ABNORMAL HIGH (ref 19–32)
Chloride: 105 mEq/L (ref 96–112)
Glucose, Bld: 147 mg/dL — ABNORMAL HIGH (ref 70–99)
Sodium: 148 mEq/L — ABNORMAL HIGH (ref 135–145)

## 2012-05-26 LAB — GLUCOSE, CAPILLARY
Glucose-Capillary: 130 mg/dL — ABNORMAL HIGH (ref 70–99)
Glucose-Capillary: 130 mg/dL — ABNORMAL HIGH (ref 70–99)

## 2012-05-26 LAB — PROTIME-INR: INR: 1.5 — ABNORMAL HIGH (ref 0.00–1.49)

## 2012-05-26 MED ORDER — PATIENT'S GUIDE TO USING COUMADIN BOOK
Freq: Once | Status: AC
  Administered 2012-05-26: 10:00:00
  Filled 2012-05-26: qty 1

## 2012-05-26 MED ORDER — WARFARIN SODIUM 7.5 MG PO TABS
7.5000 mg | ORAL_TABLET | Freq: Once | ORAL | Status: AC
  Administered 2012-05-26: 7.5 mg via ORAL
  Filled 2012-05-26: qty 1

## 2012-05-26 MED ORDER — METOPROLOL TARTRATE 25 MG PO TABS
25.0000 mg | ORAL_TABLET | Freq: Two times a day (BID) | ORAL | Status: DC
  Administered 2012-05-26 – 2012-05-29 (×6): 25 mg via ORAL
  Filled 2012-05-26 (×8): qty 1

## 2012-05-26 MED ORDER — FREE WATER
200.0000 mL | Freq: Four times a day (QID) | Status: DC
  Administered 2012-05-26 – 2012-05-29 (×13): 200 mL

## 2012-05-26 MED ORDER — WARFARIN VIDEO
Freq: Once | Status: AC
  Administered 2012-05-26: 10:00:00

## 2012-05-26 NOTE — Clinical Social Work Note (Signed)
Clinical Social Worker is continuing to follow. Patient will need to be at 28% on trach collar before accepted to a snf. Patient currently is at 40% trach collar. CSW updated SNF's that were considering patient for snf placement.   Rozetta Nunnery MSW, [employer] [phone]

## 2012-05-26 NOTE — Progress Notes (Signed)
PULMONARY  / CRITICAL CARE MEDICINE  Name: Cody [name] MRN: [id number] DOB: 06-24-1950    LOS: 44  REFERRING MD:   EDP - Dr. Preston Fleeting  CHIEF COMPLAINT:  Shortness of breath  BRIEF PATIENT DESCRIPTION: 7 yoM with COPD (3ppd smoking) admitted 1/5 with VDRF 2/2 influenza A and asp PNA, intubated 04/13/11 - 1/15 for AW protection, aspirated during intubation. Reintubated emergently 1/18 for severe delerium & resp distress, purulent secretions noted. Trach 1/22. New PE, DVT BL, s/p IVF filter 1/27. On hep gtt.   LINES / TUBES: ETT 1/5 >>1/15 , 1/18 >> 1/21 self extubated, 1/21 >> 1/22 L IJ 1/5 >> 1/13 R IJ 1/13 >> 1/16 RIJ 1/18 >> 1/28 R A-line 1/5 >>1/8 pulled out by pt PICC 1/28 >> 2/15 (pt pulled it out) Janina Mayo 1/22 >>     CULTURES: Blood culture 1/5 >> Negative Resp culture 1/6 >> H. influenzae  Resp viral panel 1/6 >> Influenza A and Metapneumovirus  Sputum Leigonella Culture 1/6 >> Negative Bronch Sputum 1/6 >> H. influenzae  Flu 1/5 >> Negative Blood culture 01/13 >> Negative Catheter tip culture 1/13 >> Staph -coag neg-70 colonies C.diff PCR 1/14 >> Negative Resp culture 1/18 >> Negative Blood culture 2/6 >>NTD Urine 2/6 >> Enterobacter aerogenes   ANTIBIOTICS: Zosyn 1/5>>1/9 - restarted 1/22 >>1/28 Azithro 1/5>>1/8  Vanc 1/6>>>1/8 - restarted 1/13 >>1/14 Tamiflu 1/6>>>1/16  Rocephin 1/9>>1/15 Ceftazidine 1/18 >>1/22 Ceftaz 2/6>> 2/15  SIGNIFICANT EVENTS:  1/5 Intubated in ED  1/5 Shock , levophed  1/6 ECHO - 55%, mod dil rv, PA pressures>>> not measured b/c no TR jet  1/6 Bronched- diffuse pus all lobes  1/7 increasing pCO2, increased RR on vent  1/8 Changed sedation to Precedex, more agitated than with versed  1/9 Changed sedation back to Versed, agitation greatly improved  1/10 scheduled haldol  1/12 Restarting precedex gtt due to persistent agitation 1/13 Febrile to 102.6 overnight. ? Catheter infection? DC'ed L IJ, placed new R IJ. 1/14 No fevers  overnight, agitation controlled on propofol (added 1/13), more interactive. 1/15 Had foul smelling stools overnight, therefore, C.diff PCR was sent by RN. 1/15 EXTUBATED, developed A.fib with RVR overnight. 1/16 Persistent episodes of desaturation., afib>started on cardizem drip  1/17 increased agitation  1/18 reintubated  1/20 Persistent A. Fib this morning on dilt drip.  Rates controlled between 99-125 1/21 Self extubated, given a chance and continued to be in respiratory distress so reintubated.  1/22 Tracheostomy, complicated by bleeding, angioedema from protamine 1/23 Neg acute ct head 1/24 PEG placement planned --> delayed bc of ileus 1/25 CTA showing PE BL --> started on heparin gtt. 1/26 BL LE DVT with mobile thrombus 1/30 Tolerated PMSV placement for 15 minutes and fatigue     SUBJECTIVE / INTERVAL HISTORY:  Remains intermittently agitated and uncooperative but pleasant   VITAL SIGNS: Temp:  [97.5 F (36.4 C)-99 F (37.2 C)] 99 F (37.2 C) (02/18 1956) Pulse Rate:  [84-113] 91 (02/18 2048) Resp:  [20-26] 22 (02/18 2048) BP: (108-126)/(77-97) 109/90 mmHg (02/18 2000) SpO2:  [92 %-98 %] 92 % (02/18 2048) FiO2 (%):  [40 %] 40 % (02/18 2048) HEMODYNAMICS: VENTILATOR SETTINGS: Vent Mode:  [-]  FiO2 (%):  [40 %] 40 % INTAKE / OUTPUT:  Intake/Output Summary (Last 24 hours) at 05/26/12 2246 Last data filed at 05/26/12 2200  Gross per 24 hour  Intake   1420 ml  Output      0 ml  Net   1420 ml  PHYSICAL EXAMINATION: FiO2 50% No resp distress RASS 0 Intermittently agitated  HEENT WNL No JVD No wheezes RRR s M NABS No edema No focal deficits  CBC    Component Value Date/Time   WBC 16.7* 05/25/2012 0555   RBC 5.06 05/25/2012 0555   HGB 15.4 05/25/2012 0555   HCT 46.9 05/25/2012 0555   PLT 209 05/25/2012 0555   MCV 92.7 05/25/2012 0555   MCH 30.4 05/25/2012 0555   MCHC 32.8 05/25/2012 0555   RDW 16.8* 05/25/2012 0555   LYMPHSABS 2.1 04/24/2012 0530   MONOABS  1.5* 04/24/2012 0530   EOSABS 0.0 04/24/2012 0530   BASOSABS 0.0 04/24/2012 0530   BMET    Component Value Date/Time   NA 148* 05/26/2012 0650   K 3.7 05/26/2012 0650   CL 105 05/26/2012 0650   CO2 33* 05/26/2012 0650   GLUCOSE 147* 05/26/2012 0650   BUN 27* 05/26/2012 0650   CREATININE 0.88 05/26/2012 0650   CALCIUM 9.9 05/26/2012 0650   GFRNONAA 89* 05/26/2012 0650   GFRAA >90 05/26/2012 0650    DIAGNOSES:   Acute Hypoxic respiratory failure    HYPERLIPIDEMIA   TOBACCO USE   HYPERTENSION   GERD   COPD (chronic obstructive pulmonary disease)   Acute encephalopathy   Acute kidney injury   Atrial fibrillation, resolved   PE (pulmonary embolism)   Tracheostomy status   Hypernatremia   Refractory hypoxemia  PLAN:  PULMONARY A: 1) Acute hypercapnic respiratory failure/ARDS - secondary to flu, COPD exacerbation. Trach 1/22. 2) Aspiration PNA - at time of intubation, resolved 4) Pulmonary edema - improved. 4) Non-oxygen dependent COPD -  6) Upper airway severe angioedema & rash from protamine, resolved  P:  - Cont current Rx - wean O2 as tolerated - Downsize trach to #4   CARDIOVASCULAR  A:  1) New onset Afib with RVR - resolved 2/01 2) Hypotension - resolved 3) Anaphylaxis to protamine - resolved 4) Hypertension P:  - D/C amiodarone 2/16 - decrease diltiazem   RENAL  A:  1) Acute Kidney injury - Resolved.  2) Hypernatremia - resolved  3) Hypokalemia - resolved  P:  - Cont current Rx and monitoring - Increase free H2O  GASTROINTESTINAL  A:  1) H/O GERD 2) Diarrhea - resolved.  3) Colonic Ileus > resolved 4) Dysphagia P: - Continue TF, Pepcid. - Minimize narcotics. - Follow SLP recs  HEMATOLOGIC  A:  4) Bilateral PE 5) Bilateral DVT - IVC filter placement 1/27 P:  - Cont warfarin  INFECTIOUS  A: 1) Flu pneumonia - resolved 2) Aspirated at time of intubation, pneumonitis vs PNA - resolved 3) CVL infection - resolved 4) Diarrhea -  resolved. 5) Leukocytosis - likely 2/2 steroids. D/C prednisone P:  Monitor off abx   ENDOCRINE  A:  1) No prior hx of DM 2) mild hyperglycemia - Monitor CBGs - Resume SSI if > 180  NEUROLOGIC / PSYCHIATRIC   A:  1) Delirium, agitated 2) Deconditioning  P:  - Increase seroquel - Mobilize  Too unpredictable to be able to move out of SDU Will need LTACH when O2 reqts improve sufficiently   Billy Fischer, MD ; [facility] service Mobile [phone].  After 5:30 PM or weekends, call [phone]

## 2012-05-26 NOTE — H&P (Signed)
Physical Medicine and Rehabilitation Admission H&P    Chief Complaint  Patient presents with  . Shortness of Breath  : HPI: Cody [name] is a 61 y.o. right-handed male with history of CVA, COPD with tobacco abuse. Admitted 04/12/2012 with increasing shortness of breath x4 days as well as productive cough occasional hemoptysis and increased confusion. Arrived in the ED in respiratory distress with oxygen saturation 60% on room air placed on NRB. Patient with increasing restlessness agitation and was intubated. Chest x-ray with acute pulmonary edema or atypical pneumonia. Placed on broad-spectrum antibiotics per critical care medicine as well as intravenous Solu-Medrol. Long-term ventilatory support with tracheostomy performed 04/29/2012 per Dr. Feinstein and presently has a #6 cuffless trach. Echocardiogram with ejection fraction 55% without PFO identified. Lower extremity Dopplers were negative on 04/13/2012 repeat 05/03/2012 showing acute DVT right saphenofemoral junction and right common femoral vein as well as left saphenofemoral junction femoral vein as well as CT angino chest 05/02/2012 showing pulmonary embolism within segmental branches to the right upper lobe, left upper lobe and left lower lobe.. An IVC filter was placed 05/04/2012 and IV heparin/Coumadin initiated 05/07/2012. Ongoing bouts of agitation and restlessness patient has received Ativan as well as Haldol with Seroquel twice daily. Wrist restraints in place as patient with multiple attempts to pull out tracheostomy tube . There was plan for gastrostomy tube placement for nutritional support however patient developed persistent ileus and procedure was placed on hold until 05/19/2012 with PEG tube placed per Dr. Newman. Patient did develop new onset atrial fibrillation with RVR and resolved 05/09/2012. Patient had been on amiodarone discontinued 05/24/2012 and maintained currently on Lopressor. Physical and occupational therapy evaluations  completed 05/02/2012 ongoing patient with noted profound deconditioning as well as agitation confusion. Physical medicine rehabilitation consult requested to consider inpatient rehabilitation services  Review of Systems  Unable to perform ROS   Past Medical History  Diagnosis Date  . Stroke   . Hypertension   . Hyperlipidemia   . GERD (gastroesophageal reflux disease)   . COPD (chronic obstructive pulmonary disease)    Past Surgical History  Procedure Laterality Date  . Gastrostomy N/A 05/19/2012    Procedure: PEG Possible Open Gastrostomy;  Surgeon: David H Newman, MD;  Location: [facility] OR;  Service: General;  Laterality: N/A;   Family History  Problem Relation Age of Onset  . COPD Mother    Social History:  reports that he has been smoking Cigarettes.  He has been smoking about 0.50 packs per day. He has never used smokeless tobacco. He reports that he does not drink alcohol or use illicit drugs. Allergies:  Allergies  Allergen Reactions  . Protamine Anaphylaxis   Medications Prior to Admission  Medication Sig Dispense Refill  . amLODipine (NORVASC) 5 MG tablet Take 1 tablet (5 mg total) by mouth daily.  90 tablet  3  . aspirin 81 MG tablet Take 81 mg by mouth daily.        . budesonide-formoterol (SYMBICORT) 160-4.5 MCG/ACT inhaler Inhale 2 puffs into the lungs 2 (two) times daily.  1 Inhaler  3  . hydrochlorothiazide (HYDRODIURIL) 25 MG tablet Take 1 tablet (25 mg total) by mouth daily.  90 tablet  3  . Ibuprofen-Diphenhydramine HCl (ADVIL PM) 200-25 MG CAPS Take 1 capsule by mouth at bedtime as needed. For sleep      . lisinopril (PRINIVIL,ZESTRIL) 40 MG tablet Take 1 tablet (40 mg total) by mouth daily.  90 tablet  3    Home:   Home Living Lives With: Spouse Available Help at Discharge: Family;Available 24 hours/day Type of Home: Mobile home Home Access: Stairs to enter Entrance Stairs-Number of Steps: 3 Entrance Stairs-Rails: Right;Left Home Layout: One level Bathroom  Shower/Tub: Walk-in shower Bathroom Toilet: Standard Bathroom Accessibility: Yes How Accessible: Accessible via walker Home Adaptive Equipment: Built-in shower seat   Functional History: Prior Function Able to Take Stairs?: Yes Driving: Yes Vocation: Full time employment  Functional Status:  Mobility: Bed Mobility Bed Mobility: Rolling Left;Sit to Sidelying Left;Scooting to HOB;Rolling Right Rolling Right: 4: Min assist;With rail Rolling Right: Patient Percentage: 10% Rolling Left: 4: Min assist Rolling Left: Patient Percentage: 10% Left Sidelying to Sit: 4: Min assist Left Sidelying to Sit: Patient Percentage: 0% Supine to Sit: 4: Min assist;HOB flat;With rails Supine to Sit: Patient Percentage: 10% Sitting - Scoot to Edge of Bed: 4: Min assist Sitting - Scoot to Edge of Bed: Patient Percentage: 20% Sit to Supine: 4: Min assist;HOB flat Sit to Supine: Patient Percentage: 20% Sit to Sidelying Left: 3: Mod assist Scooting to HOB: 1: +2 Total assist Scooting to HOB: Patient Percentage: 40% Transfers Transfers: Sit to Stand;Stand to Sit (3 trials) Sit to Stand: 3: Mod assist;With upper extremity assist;With armrests;From chair/3-in-1;From bed Sit to Stand: Patient Percentage:  (50-60%) Stand to Sit: 3: Mod assist;With upper extremity assist;With armrests;To chair/3-in-1;To bed Stand to Sit: Patient Percentage: 60% Stand Pivot Transfers: 1: +2 Total assist (bed->chair (to the right)) Stand Pivot Transfers: Patient Percentage: 30% Ambulation/Gait Ambulation/Gait Assistance: 4: Min assist;3: Mod assist Ambulation/Gait: Patient Percentage: 60% Ambulation Distance (Feet): 24 Feet Assistive device: Rolling walker Ambulation/Gait Assistance Details: occasionally needing mod facilitation for stability as pt very unsteady even with RW given his weakness; cues for tall posture with forward gaze as well as to increase his BOS for improved stability; sat after about 12 ft for rest as HR  elevated to 130s Gait Pattern: Trunk flexed;Narrow base of support;Decreased stride length General Gait Details: decreased step height Stairs: No Wheelchair Mobility Wheelchair Mobility: No  ADL: ADL Eating/Feeding: NPO Grooming: Performed;Brushing hair;Supervision/safety (vc for thoroughness) Where Assessed - Grooming: Unsupported sitting Upper Body Bathing: Simulated;Maximal assistance Where Assessed - Upper Body Bathing: Unsupported sitting Lower Body Bathing: Simulated;+1 Total assistance Where Assessed - Lower Body Bathing: Unsupported sitting Upper Body Dressing: Performed;Maximal assistance Where Assessed - Upper Body Dressing: Supine, head of bed up Lower Body Dressing: Performed;Minimal assistance;Other (comment) (for donning gripper socks only) Where Assessed - Lower Body Dressing: Unsupported sitting Toilet Transfer: Simulated;Moderate assistance Toilet Transfer Method: Stand pivot Toilet Transfer Equipment:  (bed) Equipment Used: Gait belt;Rolling walker Transfers/Ambulation Related to ADLs: Pt requires mod assist for sit to stand and to take a few small steps up the edge of the bed or out in front away from the bed. ADL Comments: Pt able to transfer and sit EOB for 7 mins with min guard assist while using bilateral UEs to donn gripper socks.  Performed sit to stand x3 for 1-2 minute intervals, including stepping.  HR increased to 135 with standing and O2 decreased to 87% on 10Ls at 60% trach collar  Cognition: Cognition Arousal/Alertness: Awake/alert Orientation Level: Oriented to person;Oriented to place Cognition Overall Cognitive Status: Impaired Area of Impairment: Memory;Attention;Safety/judgement Difficult to assess due to: Tracheostomy Arousal/Alertness: Awake/alert Orientation Level: Appears intact for tasks assessed Behavior During Session: WFL for tasks performed Current Attention Level: Selective Attention - Other Comments: affected by fatigue Following  Commands: Follows one step commands consistently;Follows multi-step commands with increased time Safety/Judgement:   Impulsive;Decreased awareness of need for assistance Safety/Judgement - Other Comments: needs several reminders of why he can't have food or anythign to drink Awareness of Errors: Assistance required to correct errors made;Assistance required to identify errors made Awareness of Deficits: needs reminders about his situation and why he not allowed to drink yet, impulsively trying to stand up without therapist there to help him needing reminders that he still needs help Problem Solving: min-mod functional basic Cognition - Other Comments: perseverating on water; was able to tell me to call his son and girlfriend today, wrote their numbers down on a sheet of paper  Physical Exam: Blood pressure 126/97, pulse 107, temperature 97.5 F (36.4 C), temperature source Oral, resp. rate 20, height 5' 10" (1.778 m), weight 77.3 kg (170 lb 6.7 oz), SpO2 98.00%. Physical Exam  Vitals reviewed.  HENT: oral mucosa pink, dry Head: Normocephalic and atraumatic.  Left Ear: External ear normal.  Eyes: Conjunctivae normal and EOM are normal. Pupils are equal, round, and reactive to light.  Neck: Normal range of motion. Neck supple.  Tracheostomy tube in place, #6 cuffless.. No drainage. No cap Cardiovascular:  No murmur heard. Cardiac rate controlled at present. No murmur or rub Pulmonary/Chest: No respiratory distress. He has no wheezes. No rales Decreased breath sounds to bases but clear to auscultation  Abdominal: Soft.  Abdomen is soft with positive bowel sounds. PEG tube in place  Neurological: He is alert.  Patient did attempt to mouth some words. Occasionally able to talk around trach in a whisper. He was easily distracted. He made good eye contact with examiner. He followed one and two-step demonstrated commands however inconsistent. UE 2+ to 3 proxm with deltoid, biceps, triceps to 4-  distally. LE are 2+ to 3HF and KE, 4 with ADF and APF. No gross sensory deficits.  Skin: Skin is warm and dry.  Numerous ecchymoses and bruises on arms, legs   Results for orders placed during the hospital encounter of 04/12/12 (from the past 48 hour(s))  GLUCOSE, CAPILLARY     Status: Abnormal   Collection Time    05/24/12  7:56 AM      Result Value Range   Glucose-Capillary 162 (*) 70 - 99 mg/dL   Comment 1 Notify RN    GLUCOSE, CAPILLARY     Status: Abnormal   Collection Time    05/24/12 12:02 PM      Result Value Range   Glucose-Capillary 178 (*) 70 - 99 mg/dL   Comment 1 Notify RN    GLUCOSE, CAPILLARY     Status: Abnormal   Collection Time    05/24/12  3:51 PM      Result Value Range   Glucose-Capillary 172 (*) 70 - 99 mg/dL   Comment 1 Notify RN    GLUCOSE, CAPILLARY     Status: Abnormal   Collection Time    05/24/12 10:09 PM      Result Value Range   Glucose-Capillary 149 (*) 70 - 99 mg/dL  PROTIME-INR     Status: Abnormal   Collection Time    05/25/12  5:55 AM      Result Value Range   Prothrombin Time 17.0 (*) 11.6 - 15.2 seconds   INR 1.42  0.00 - 1.49  BASIC METABOLIC PANEL     Status: Abnormal   Collection Time    05/25/12  5:55 AM      Result Value Range   Sodium 147 (*) 135 - 145 mEq/L   Potassium   3.5  3.5 - 5.1 mEq/L   Chloride 104  96 - 112 mEq/L   CO2 32  19 - 32 mEq/L   Glucose, Bld 141 (*) 70 - 99 mg/dL   BUN 27 (*) 6 - 23 mg/dL   Creatinine, Ser 0.86  0.50 - 1.35 mg/dL   Calcium 10.2  8.4 - 10.5 mg/dL   GFR calc non Af Amer >90  >90 mL/min   GFR calc Af Amer >90  >90 mL/min   Comment:            The eGFR has been calculated     using the CKD EPI equation.     This calculation has not been     validated in all clinical     situations.     eGFR's persistently     <90 mL/min signify     possible Chronic Kidney Disease.  CBC     Status: Abnormal   Collection Time    05/25/12  5:55 AM      Result Value Range   WBC 16.7 (*) 4.0 - 10.5 K/uL    RBC 5.06  4.22 - 5.81 MIL/uL   Hemoglobin 15.4  13.0 - 17.0 g/dL   HCT 46.9  39.0 - 52.0 %   MCV 92.7  78.0 - 100.0 fL   MCH 30.4  26.0 - 34.0 pg   MCHC 32.8  30.0 - 36.0 g/dL   RDW 16.8 (*) 11.5 - 15.5 %   Platelets 209  150 - 400 K/uL  GLUCOSE, CAPILLARY     Status: Abnormal   Collection Time    05/25/12  8:11 AM      Result Value Range   Glucose-Capillary 136 (*) 70 - 99 mg/dL  GLUCOSE, CAPILLARY     Status: Abnormal   Collection Time    05/25/12 12:08 PM      Result Value Range   Glucose-Capillary 133 (*) 70 - 99 mg/dL  GLUCOSE, CAPILLARY     Status: Abnormal   Collection Time    05/25/12  4:30 PM      Result Value Range   Glucose-Capillary 155 (*) 70 - 99 mg/dL  GLUCOSE, CAPILLARY     Status: Abnormal   Collection Time    05/25/12 11:46 PM      Result Value Range   Glucose-Capillary 142 (*) 70 - 99 mg/dL   Comment 1 Documented in Chart     Comment 2 Notify RN     No results found.  Post Admission Physician Evaluation: 1. Functional deficits secondary  to acute vent dependent respiratory failure with multiple medical complications and subsequent deconditioning and encephalopathy. 2. Patient is admitted to receive collaborative, interdisciplinary care between the physiatrist, rehab nursing staff, and therapy team. 3. Patient's level of medical complexity and substantial therapy needs in context of that medical necessity cannot be provided at a lesser intensity of care such as a SNF. 4. Patient has experienced substantial functional loss from his/her baseline which was documented above under the "Functional History" and "Functional Status" headings.  Judging by the patient's diagnosis, physical exam, and functional history, the patient has potential for functional progress which will result in measurable gains while on inpatient rehab.  These gains will be of substantial and practical use upon discharge  in facilitating mobility and self-care at the household  level. 5. Physiatrist will provide 24 hour management of medical needs as well as oversight of the therapy plan/treatment and provide guidance as appropriate regarding   the interaction of the two. 6. 24 hour rehab nursing will assist with bladder management, bowel management, safety, skin/wound care, disease management, medication administration, pain management and patient education  and help integrate therapy concepts, techniques,education, etc. 7. PT will assess and treat for/with: Lower extremity strength, range of motion, stamina, balance, functional mobility, safety, adaptive techniques and equipment, education.   Goals are: supervision. 8. OT will assess and treat for/with: ADL's, functional mobility, safety, upper extremity strength, adaptive techniques and equipment, education.   Goals are: supervision to min assist. 9. SLP will assess and treat for/with: cognition, communication, safety, swallowing.  Goals are: supervision. 10. Case Management and Social Worker will assess and treat for psychological issues and discharge planning. 11. Team conference will be held weekly to assess progress toward goals and to determine barriers to discharge. 12. Patient will receive at least 3 hours of therapy per day at least 5 days per week. 13. ELOS: 2-3 weeks      Prognosis:  good   Medical Problem List and Plan: 1. Severe deconditioning encephalopathy after CHF, [name]. 2. DVT Prophylaxis/Anticoagulation: Right lower extremity DVT/pulmonary emboli. IVC filter 05/04/2012 as well as Coumadin therapy. 3. Mood/ delirium. Seroquel 100 mg twice a day, Klonopin 1 mg twice a day. Check sleep chart. Restraints for safety  -wean meds, decrease restraints as soon as possible 4. Neuropsych: This patient is not capable of making decisions on his/her own behalf. 5. Tracheostomy tube. Status post 04/29/2012. Currently #6 cuffless trach in place. Speech therapy followup for PMV. 6.  Dysphagia. Status post gastrostomy PEG tube 05/19/2012. Continue nutritional support 7. COPD/tobacco abuse. Continue nebs as directed 8. New onset atrial fib/RVR. Continue Lopressor as directed with cardiac rate control 9. Recurrent ileus. Resolved and monitored  Matilda Fleig T. Lyndall Bellot, MD, FAAPMR   05/26/2012 

## 2012-05-26 NOTE — Progress Notes (Signed)
ANTICOAGULATION CONSULT NOTE - Follow Up Consult  Pharmacy Consult for warfarin Indication: pulmonary embolus and DVT  Allergies  Allergen Reactions  . Protamine Anaphylaxis    Patient Measurements: Height: 5\' 10"  (177.8 cm) Weight: 170 lb 6.7 oz (77.3 kg) IBW/kg (Calculated) : 73  Vital Signs: Temp: 98 F (36.7 C) (02/18 0800) Temp src: Oral (02/18 0800) BP: 108/87 mmHg (02/18 0800) Pulse Rate: 102 (02/18 0800)  Labs:  Recent Labs  05/24/12 0615 05/25/12 0555 05/26/12 0650  HGB  --  15.4  --   HCT  --  46.9  --   PLT  --  209  --   LABPROT 19.4* 17.0* 17.7*  INR 1.70* 1.42 1.50*  CREATININE  --  0.86 0.88    Estimated Creatinine Clearance: 87.6 ml/min (by C-G formula based on Cr of 0.88).   Medications:  Scheduled:  . antiseptic oral rinse  15 mL Mouth Rinse QID  . chlorhexidine  15 mL Mouth Rinse BID  . clonazePAM  1 mg Per Tube BID  . diltiazem  30 mg Per Tube Q8H  . famotidine (PEPCID) IV  20 mg Intravenous Daily  . free water  200 mL Per Tube Q8H  . [COMPLETED] furosemide  40 mg Intravenous Once  . ipratropium  0.5 mg Nebulization Q6H  . levalbuterol  0.63 mg Nebulization Q6H  . multivitamin  5 mL Per Tube Daily  . [COMPLETED] potassium chloride  40 mEq Per Tube BID  . QUEtiapine  100 mg Per Tube BID  . [COMPLETED] warfarin  7.5 mg Oral ONCE-1800  . Warfarin - Pharmacist Dosing Inpatient   Does not apply q1800  . [DISCONTINUED] diltiazem  30 mg Per Tube Q6H  . [DISCONTINUED] free water  100 mL Per Tube Q8H  . [DISCONTINUED] QUEtiapine  100 mg Per Tube QHS   Infusions:  . sodium chloride 10 mL/hr at 05/19/12 1359  . feeding supplement (JEVITY 1.2 CAL) 1,000 mL (05/25/12 0516)    Assessment: 62 yo male with new PE/DVT (s/p IVC filter on 1/27). He has a trach. INR subtherapeutic, has been labile on Coumadin day #6. Of note, amiodarone d/c'd 2/16.  Platelets are WNL. No bleeding noted.   Goal of Therapy:  INR 2-3 Monitor platelets by  anticoagulation protocol: Yes   Plan:  1. Warfarin 7.5 mg PO x 1 tonight.  Will likely need to reduce dose tomorrow. 2. Daily PT/INR  3. Monitor for bleeding complications. 4. Begin Coumadin education as able with patient or family.  Tad Moore, BCPS  Clinical Pharmacist Pager [phone]  05/26/2012 9:39 AM

## 2012-05-26 NOTE — Progress Notes (Signed)
SLP Cancellation Note  Patient Details Name: Cody [name] MRN: [id number] DOB: January 14, 1951   Cancelled treatment:  Attempt x1 for diagnostic treatment to administer PO trials and not completed due to patient with noted lethargy and decreased O2 saturations increasing risk for aspiration.  ST to reattempt on 05/27/12. Moreen Fowler, MS, CCC-SLP [phone] [facility] 05/26/2012, 4:26 PM

## 2012-05-26 NOTE — Progress Notes (Addendum)
CIR Admissions: Have received calls from patient's therapists reporting pt is starting to progress in therapies. Dr Riley Kill met with pt yesterday to evaluate progress. I am continuing to follow pt's progress daily to assess medical status and ability to tolerate and progress in a more intensive setting. Please call [phone] with questions.

## 2012-05-27 LAB — GLUCOSE, CAPILLARY
Glucose-Capillary: 143 mg/dL — ABNORMAL HIGH (ref 70–99)
Glucose-Capillary: 104 mg/dL — ABNORMAL HIGH (ref 70–99)
Glucose-Capillary: 153 mg/dL — ABNORMAL HIGH (ref 70–99)

## 2012-05-27 MED ORDER — FENTANYL CITRATE 0.05 MG/ML IJ SOLN: 25.0000 ug | INTRAMUSCULAR | Status: DC | PRN

## 2012-05-27 MED ORDER — WARFARIN SODIUM 5 MG PO TABS
5.0000 mg | ORAL_TABLET | Freq: Once | ORAL | Status: AC
  Administered 2012-05-27: 5 mg via ORAL
  Filled 2012-05-27: qty 1

## 2012-05-27 NOTE — Progress Notes (Signed)
Speech Language Pathology Dysphagia and PMSVTreatment Patient Details Name: Cody [name] MRN: [id number] DOB: [birth year] Today's Date: 05/27/2012 Time: 4098-1191 SLP Time Calculation (min): 35 min  Assessment / Plan / Recommendation Clinical Impression  Skilled treatment today for phonation with PMSV and dysphagia.  Pt. alert, pleasant sitting in recliner after working with PT/OT.  Oral cavity thoroughly cleaned removing cream colored mucous (mild) from uvula, tongue and faucal arches and PMSV donned.  Speech intelligibility continues reduced with a breathy/whisper quality most likely due to inadequate breath support/inhalation and inability to completely clear pharyngeal secretions. SLP provided maximum verbal/visual/tactile assist to initiate deep breath for increased vocal intensity (next session will attempt phonation during resistance/bear weight to facilitate increased vocal cord adduction)  Therapeutic PO trials with pudding, ice chip and tsp amounts water consumed with mild oral delays and decreased cohesion.  Suspected pharyngeal delay with probable pharyngeal residue.  Vocal quality intermittently wet from possible penetration.  Mod-max verbal reminders to swallow 2 additional times following initial swallow.  Pt. is making significant progress and recommend continuing dysphagia exercises with possible repeat MBS next week.        Diet Recommendation       SLP Plan Continue with current plan of care   Pertinent Vitals/Pain none   Swallowing Goals  SLP Swallowing Goals Goal #3: Pt. will perform exercises for improved tongue base and laryngeal ROM and strength with max visual/verbal/tactile cues. Swallow Study Goal #3 - Progress: Progressing toward goal Goal #4: Pt. will consume trials of puree only with SLP for increased strength during treatment with max verbal/visual cues. Swallow Study Goal #4 - Progress: Progressing toward goal  General Temperature Spikes Noted:  No Respiratory Status: Trach Behavior/Cognition: Alert;Cooperative;Pleasant mood;Requires cueing Oral Cavity - Dentition: Missing dentition Patient Positioning: Partially reclined  Oral Cavity - Oral Hygiene Does patient have any of the following "at risk" factors?: Oxygen therapy - cannula, mask, simple oxygen devices;Saliva - thick, dry mouth;Mucous Membranes - reddened Brush patient's teeth BID with toothbrush (using toothpaste with fluoride): Yes Patient is HIGH RISK - Oral Care Protocol followed (see row info): Yes   Dysphagia Treatment Treatment focused on: Skilled observation of diet tolerance Treatment Methods/Modalities: Effortful swallow (high pitched /e/) Patient observed directly with PO's: Yes Type of PO's observed: Thin liquids;Ice chips;Dysphagia 1 (puree) Feeding: Able to feed self Liquids provided via: Teaspoon Oral Phase Signs & Symptoms: Prolonged bolus formation Pharyngeal Phase Signs & Symptoms: Wet vocal quality;Delayed throat clear;Suspected delayed swallow initiation Type of cueing: Verbal;Tactile;Visual Amount of cueing: Maximal       Royce Macadamia M.Ed [employer] [phone]  05/27/2012

## 2012-05-27 NOTE — Progress Notes (Signed)
Occupational Therapy Treatment Patient Details Name: Cody [name] MRN: [id number] DOB: 09/26/1950 Today's Date: 05/27/2012 Time: 4098-1191 OT Time Calculation (min): 62 min  OT Assessment / Plan / Recommendation Comments on Treatment Session Pt demonstrating increased activity tolerance today.  Pt ambulated to bathroom to perform grooming ADLs. Vitals stable throughout session.  Continue to recommend CIR as pt is consistently demonstrating progress with each session and would benefit from further intensive rehab to return to PLOF.    Follow Up Recommendations  CIR    Barriers to Discharge       Equipment Recommendations       Recommendations for Other Services Rehab consult  Frequency Min 2X/week   Plan Discharge plan remains appropriate    Precautions / Restrictions Precautions Precautions: Fall Precaution Comments: trach, PEG Restrictions Weight Bearing Restrictions: No   Pertinent Vitals/Pain See vitals; FiO2 40% on 10L/min O2 sats 93-97% throughout session.    ADL  Eating/Feeding: NPO Grooming: Performed;Wash/dry face;Wash/dry hands;Min guard Where Assessed - Grooming: Supported standing Lower Body Dressing: Performed;Minimal assistance (donned ) Where Assessed - Lower Body Dressing: Unsupported sitting Toilet Transfer: Simulated;Minimal assistance Toilet Transfer Method: Sit to stand (ambulating) Equipment Used: Gait belt;Rolling walker Transfers/Ambulation Related to ADLs: Pt ambulated to bathroom with min assist for grooming ADLs and then ~20 ft through room to door with min assist and sitting rest break x2. ADL Comments: Pt stood at sink with min guard and leaned anteriorly with bil UE supported on countertop of sink while washing face.  Pt independently reached for towel and cleaned glasses without cueing or assist from therapist.  Min assist to don right sock (pt with difficulty crossing right ankle over knee), no assist required to don left sock (close guarding  for safety with dynamic sitting EOB).     OT Diagnosis:    OT Problem List:   OT Treatment Interventions:     OT Goals ADL Goals Pt Will Perform Grooming: with min assist;Standing at sink;Other (comment) ADL Goal: Grooming - Progress: Progressing toward goals Pt Will Perform Lower Body Dressing: with min assist;Sit to stand from bed;Supported ADL Goal: Lower Body Dressing - Progress: Progressing toward goals Pt Will Transfer to Toilet: with supervision;3-in-1;with DME ADL Goal: Toilet Transfer - Progress: Progressing toward goals Miscellaneous OT Goals Miscellaneous OT Goal #2: Pt will transfer from supine to sit EOB with supervision in preparation for selfcare tasks. OT Goal: Miscellaneous Goal #2 - Progress: Progressing toward goals  Visit Information  Last OT Received On: 05/27/12 Assistance Needed: +2 PT/OT Co-Evaluation/Treatment: Yes    Subjective Data      Prior Functioning       Cognition  Cognition Overall Cognitive Status: Impaired Area of Impairment: Attention;Memory;Safety/judgement;Following commands;Executive functioning;Awareness of errors Difficult to assess due to: Tracheostomy Arousal/Alertness: Awake/alert Behavior During Session: [facility] for tasks performed Following Commands: Follows one step commands consistently;Follows multi-step commands with increased time Safety/Judgement: Decreased awareness of need for assistance;Impulsive Awareness of Errors: Assistance required to correct errors made;Assistance required to identify errors made Problem Solving: min- mod functional basic Cognition - Other Comments: having some hallucinations today, talking about how he wants to go with another company and saw knives in his chair    Mobility  Bed Mobility Bed Mobility: Rolling Left;Left Sidelying to Sit Rolling Left: 4: Min assist Left Sidelying to Sit: 4: Min assist Sitting - Scoot to Edge of Bed: 4: Min assist Details for Bed Mobility Assistance: verbal step  by step sequencing cues for efficiency of movement;  Transfers  Transfers: Sit to Stand;Stand to Sit Sit to Stand: 4: Min assist;4: Min guard;From chair/3-in-1;From bed;With armrests;With upper extremity assist Stand to Sit: 4: Min assist;To chair/3-in-1;With upper extremity assist;With armrests Details for Transfer Assistance: Assist to facilitate initiation of power up from chair.  Pt performed sit<>stand from chair with min guard x1, and min assist x4.  Assist to control descent to chair due to pt tendency to hold on to RW while performing stand<>sit.    Exercises      Balance Static Sitting Balance Static Sitting - Balance Support: No upper extremity supported;Feet supported Static Sitting - Level of Assistance: 5: Stand by assistance Dynamic Sitting Balance Dynamic Sitting - Balance Support: Feet supported;During functional activity Dynamic Sitting - Level of Assistance:  (min guard) Dynamic Sitting Balance - Compensations: close guarding for safety while donning socks.   End of Session OT - End of Session Equipment Utilized During Treatment: Gait belt Activity Tolerance: Patient tolerated treatment well Patient left: in chair;with call bell/phone within reach (with SLP) Nurse Communication: Mobility status;Other (comment) (hallucinations while sitting in chair)  GO   05/27/2012 Cipriano Mile OTR/L Pager [phone] Office [phone]   Cipriano Mile 05/27/2012, 10:45 AM

## 2012-05-27 NOTE — Progress Notes (Signed)
Physical Therapy Treatment Patient Details Name: Cody [name] MRN: [id number] DOB: 1951/01/24 Today's Date: 05/27/2012 Time:  -0929    PT Assessment / Plan / Recommendation Comments on Treatment Session  Adm. for sob and resp fail due to pna with VDRF, trach and bronchoscopy 1/22. Great participation today show progression of activity tolerance especially with standing and ambulation (ambulated 30 ft with 3 rest breaks and vitals remained stable throughout). Pt reporting to me today that he feels he has been hallucinating and then proceeded to get agitated and told me he saw knives on the arm rest. With verbal calming cues I was able to explain to him that he was again hallucinating. RN made aware. I feel patient would benefit more from a sitter vs restraints as he does very well when the situation is explained to him. RN and MD aware of this recommendation. Continue to strongly recommend CIR.    Follow Up Recommendations  CIR     Does the patient have the potential to tolerate intense rehabilitation     Barriers to Discharge        Equipment Recommendations  Rolling walker with 5" wheels    Recommendations for Other Services    Frequency Min 3X/week   Plan Discharge plan remains appropriate;Frequency remains appropriate    Precautions / Restrictions Precautions Precautions: Fall Precaution Comments: trach, PEG Restrictions Weight Bearing Restrictions: No   Pertinent Vitals/Pain HR 90s during activity, RR 32, SpO2 93% on 40% trach collar    Mobility  Bed Mobility Bed Mobility: Rolling Left;Left Sidelying to Sit Rolling Left: 4: Min assist Left Sidelying to Sit: 4: Min assist Sitting - Scoot to [place] of Bed: 4: Min assist Details for Bed Mobility Assistance: verbal step by step sequencing cues for efficiency of movement;  Transfers Transfers: Sit to Stand;Stand to Sit (sit<>stand x 4) Sit to Stand: 4: Min assist;4: Min guard;From chair/3-in-1;From bed;With armrests;With  upper extremity assist Stand to Sit: 4: Min assist;To chair/3-in-1;With upper extremity assist;With armrests Details for Transfer Assistance: Assist to facilitate initiation of power up from chair.  Pt performed sit<>stand from chair with min guard x1, and min assist x4.  Assist to control descent to chair due to pt tendency to hold on to RW while performing stand<>sit. Ambulation/Gait Ambulation/Gait Assistance: 4: Min assist Assistive device: Rolling walker Ambulation/Gait Assistance Details: ambulated 30 ft with 3 seated rest breaks, verbal and facilitatory cues for tall posture and safe BOS as well as maneuvering RW and stability assist especially as he fatigues Gait Pattern: Trunk flexed;Shuffle;Decreased stride length;Narrow base of support      PT Goals Acute Rehab PT Goals PT Goal: Rolling Supine to Left Side - Progress: Progressing toward goal PT Goal: Supine/Side to Sit - Progress: Progressing toward goal PT Goal: Sit at Edge Of Bed - Progress: Progressing toward goal PT Goal: Sit to Stand - Progress: Progressing toward goal PT Goal: Stand to Sit - Progress: Progressing toward goal PT Transfer Goal: Bed to Chair/Chair to Bed - Progress: Progressing toward goal PT Goal: Stand - Progress: Progressing toward goal PT Goal: Ambulate - Progress: Progressing toward goal  Visit Information  Last PT Received On: 05/27/12 Assistance Needed: +2    Subjective Data  Subjective: I know I have been hallucinating.  Patient Stated Goal: to be able to eat a full meal   Cognition  Cognition Overall Cognitive Status: Impaired Area of Impairment: Attention;Memory;Safety/judgement;Following commands;Executive functioning;Awareness of errors Difficult to assess due to: Tracheostomy Arousal/Alertness: Awake/alert Behavior During Session:  WFL for tasks performed Following Commands: Follows one step commands consistently;Follows multi-step commands with increased time Safety/Judgement:  Decreased awareness of need for assistance;Impulsive Awareness of Errors: Assistance required to correct errors made;Assistance required to identify errors made Problem Solving: min- mod functional basic Cognition - Other Comments: having some hallucinations today, talking about how he wants to go with another company and saw knives in his chair    Balance  Static Sitting Balance Static Sitting - Balance Support: No upper extremity supported;Feet supported Static Sitting - Level of Assistance: 5: Stand by assistance Static Sitting - Comment/# of Minutes: sat EOB, cues for tall posture and anterior pelvic tilt Dynamic Sitting Balance Dynamic Sitting - Balance Support: Feet supported;During functional activity Dynamic Sitting - Level of Assistance:  (min guard) Dynamic Sitting Balance - Compensations: close guarding for safety while donning socks Dynamic Sitting - Balance Activities: Reaching for objects;Forward lean/weight shifting Dynamic Sitting - Comments: trunk rotational activities Static Standing Balance Static Standing - Balance Support: Bilateral upper extremity supported Static Standing - Level of Assistance: 4: Min assist Static Standing - Comment/# of Minutes: stood x4 today, stood at the sink with flexed posture resting on elbow due to fatigue Dynamic Standing Balance Dynamic Standing - Balance Support: Left upper extremity supported Dynamic Standing - Level of Assistance: 4: Min assist Dynamic Standing - Balance Activities: Reaching for objects;Lateral lean/weight shifting;Forward lean/weight shifting Dynamic Standing - Comments: pt performing grooming activities at the sink using RUE (still needing LUE for support); anterior and lateral weight shifts for improved balance, tends to flex forward to rest arms on sink due to fatigue High Level Balance High Level Balance Activites: Backward walking High Level Balance Comments: mod stability assist with backward walking 5 ft using  RW  End of Session PT - End of Session Equipment Utilized During Treatment: Gait belt Activity Tolerance: Patient tolerated treatment well Patient left: in chair;with call bell/phone within reach;Other (comment) (with speech therapist present) Nurse Communication: Mobility status;Other (comment) (hallucinations, request for sitter vs restraints)   GP     [facility] HELEN 05/27/2012, 11:48 AM

## 2012-05-27 NOTE — Progress Notes (Signed)
Spoke with Dr Riley Kill this AM re:Mr Cody [name]. He reports that after conferring with Dr Sung Amabile he feels pt is not medically stable for inpatient rehab. Will continue to follow pt to assess medical and functional progress. Once pt's MD reports pt is medically stable we will evaluate for CIR. Please call [phone] for questions.

## 2012-05-27 NOTE — Progress Notes (Signed)
ANTICOAGULATION CONSULT NOTE - Follow Up Consult  Pharmacy Consult for warfarin Indication: pulmonary embolus and DVT  Allergies  Allergen Reactions  . Protamine Anaphylaxis    Patient Measurements: Height: 5\' 10"  (177.8 cm) Weight: 172 lb 6.4 oz (78.2 kg) IBW/kg (Calculated) : 73  Vital Signs: Temp: 97.5 F (36.4 C) (02/19 0349) Temp src: Oral (02/19 0349) BP: 110/80 mmHg (02/19 0349) Pulse Rate: 82 (02/19 0742)  Labs:  Recent Labs  05/25/12 0555 05/26/12 0650 05/27/12 0425  HGB 15.4  --   --   HCT 46.9  --   --   PLT 209  --   --   LABPROT 17.0* 17.7* 18.6*  INR 1.42 1.50* 1.61*  CREATININE 0.86 0.88  --     Estimated Creatinine Clearance: 87.6 ml/min (by C-G formula based on Cr of 0.88).   Medications:  Scheduled:  . antiseptic oral rinse  15 mL Mouth Rinse QID  . chlorhexidine  15 mL Mouth Rinse BID  . clonazePAM  1 mg Per Tube BID  . free water  200 mL Per Tube Q6H  . ipratropium  0.5 mg Nebulization Q6H  . levalbuterol  0.63 mg Nebulization Q6H  . metoprolol tartrate  25 mg Oral BID  . multivitamin  5 mL Per Tube Daily  . [COMPLETED] patient's guide to using coumadin book   Does not apply Once  . QUEtiapine  100 mg Per Tube BID  . [COMPLETED] warfarin  7.5 mg Oral ONCE-1800  . [COMPLETED] warfarin   Does not apply Once  . Warfarin - Pharmacist Dosing Inpatient   Does not apply q1800  . [DISCONTINUED] diltiazem  30 mg Per Tube Q8H  . [DISCONTINUED] famotidine (PEPCID) IV  20 mg Intravenous Daily  . [DISCONTINUED] free water  200 mL Per Tube Q8H   Infusions:  . sodium chloride 10 mL/hr at 05/19/12 1359  . feeding supplement (JEVITY 1.2 CAL) 1,000 mL (05/25/12 0516)    Assessment: 62 yo male with new PE/DVT (s/p IVC filter on 1/27). He has a trach. INR today is 1.61 on Coumadin day #7. Of note, amiodarone d/c'd 2/16. INR trend has been variable but would predict a coumadin maintenance regimen < 7.5mg /day.   Goal of Therapy:  INR 2-3 Monitor  platelets by anticoagulation protocol: Yes   Plan:  1. Warfarin 5 mg PO x 1 tonight.   2. Daily PT/INR  3. Monitor for bleeding complications. 4. Begin Coumadin education as able with patient or family.  Harland German, Pharm D 05/27/2012 8:35 AM

## 2012-05-27 NOTE — Progress Notes (Signed)
Received call from patient's partner,Lisa. She has many questions re: pt's medical and cogniitive status. I encouraged her to call pt's nurse and make a request to talk with pt's MD.

## 2012-05-27 NOTE — Progress Notes (Signed)
F/U with urinary output , patient remains with no output. MD made aware, stated will address matter.

## 2012-05-27 NOTE — Progress Notes (Signed)
No void since 0700, bladder scanned for 84 cc . Patient without complaints of abdominal discomforts. MD aware and order noted to continue monitoring UOP  AND F/U  with MD by 7pm  with results.

## 2012-05-27 NOTE — Progress Notes (Signed)
PULMONARY  / CRITICAL CARE MEDICINE  Name: Cody [name] MRN: [id number] DOB: [birth year]    LOS: 45  REFERRING MD:   EDP - Dr. Preston Fleeting  CHIEF COMPLAINT:  Shortness of breath  BRIEF PATIENT DESCRIPTION: 51 yoM with COPD (3ppd smoking) admitted 1/5 with VDRF 2/2 influenza A and asp PNA, intubated 04/13/11 - 1/15 for AW protection, aspirated during intubation. Reintubated emergently 1/18 for severe delerium & resp distress, purulent secretions noted. Trach 1/22. New PE, DVT BL, s/p IVF filter 1/27.   LINES / TUBES: ETT 1/5 >>1/15 , 1/18 >> 1/21 self extubated, 1/21 >> 1/22 L IJ 1/5 >> 1/13 R IJ 1/13 >> 1/16 RIJ 1/18 >> 1/28 R A-line 1/5 >>1/8 pulled out by pt PICC 1/28 >> 2/15 (pt pulled it out) Janina Mayo 1/22 >>     CULTURES: Blood culture 1/5 >> Negative Resp culture 1/6 >> H. influenzae  Resp viral panel 1/6 >> Influenza A and Metapneumovirus  Sputum Leigonella Culture 1/6 >> Negative Bronch Sputum 1/6 >> H. influenzae  Flu 1/5 >> Negative Blood culture 01/13 >> Negative Catheter tip culture 1/13 >> Staph -coag neg-70 colonies C.diff PCR 1/14 >> Negative Resp culture 1/18 >> Negative Blood culture 2/6 >>NTD Urine 2/6 >> Enterobacter aerogenes   ANTIBIOTICS: Zosyn 1/5>>1/9 - restarted 1/22 >>1/28 Azithro 1/5>>1/8  Vanc 1/6>>>1/8 - restarted 1/13 >>1/14 Tamiflu 1/6>>>1/16  Rocephin 1/9>>1/15 Ceftazidine 1/18 >>1/22 Ceftaz 2/6>> 2/15  SIGNIFICANT EVENTS:  1/5 Intubated in ED  1/5 Shock , levophed  1/6 ECHO - 55%, mod dil RV, PA pressures not measured, no TR jet  1/6 Bronched- diffuse pus all lobes  1/7 increasing pCO2, increased RR on vent  1/8 Changed sedation to Precedex, more agitated than with versed  1/9 Changed sedation back to Versed, agitation greatly improved  1/10 scheduled haldol  1/12 Restarting precedex gtt due to persistent agitation 1/13 Febrile to 102.6 overnight. ? Catheter infection? DC'ed L IJ, placed new R IJ. 1/14 No fevers overnight, agitation  controlled on propofol (added 1/13), more interactive. 1/15 Had foul smelling stools overnight, therefore, C.diff PCR was sent by RN. 1/15 EXTUBATED, developed A.fib with RVR overnight. 1/16 Persistent episodes of desaturation., afib>started on cardizem drip  1/17 increased agitation  1/18 reintubated  1/20 Persistent A. Fib this morning on dilt drip.  Rates controlled between 99-125 1/21 Self extubated, given a chance and continued to be in respiratory distress so reintubated.  1/22 Tracheostomy, complicated by bleeding, angioedema from protamine 1/23 Neg acute ct head 1/24 PEG placement planned --> delayed bc of ileus 1/25 CTA showing PE BL --> started on heparin gtt. 1/26 BL LE DVT with mobile thrombus 1/30 Tolerated PMSV placement for 15 minutes and fatigue     SUBJECTIVE / INTERVAL HISTORY:  No distress   VITAL SIGNS: Temp:  [97.3 F (36.3 C)-99.2 F (37.3 C)] 98.8 F (37.1 C) (02/19 2004) Pulse Rate:  [77-96] 82 (02/19 2100) Resp:  [18-28] 19 (02/19 1945) BP: (80-110)/(50-80) 96/73 mmHg (02/19 2100) SpO2:  [93 %-100 %] 99 % (02/19 1945) FiO2 (%):  [40 %] 40 % (02/19 2007) Weight:  [78.2 kg (172 lb 6.4 oz)] 78.2 kg (172 lb 6.4 oz) (02/19 0600) HEMODYNAMICS: VENTILATOR SETTINGS: Vent Mode:  [-]  FiO2 (%):  [40 %] 40 % INTAKE / OUTPUT:  Intake/Output Summary (Last 24 hours) at 05/27/12 2233 Last data filed at 05/27/12 2209  Gross per 24 hour  Intake    120 ml  Output    200 ml  Net    -80 ml   PHYSICAL EXAMINATION: FiO2 50% No resp distress RASS 0 Intermittently agitated  HEENT WNL No JVD No wheezes RRR s M NABS No edema No focal deficits  CBC    Component Value Date/Time   WBC 16.7* 05/25/2012 0555   RBC 5.06 05/25/2012 0555   HGB 15.4 05/25/2012 0555   HCT 46.9 05/25/2012 0555   PLT 209 05/25/2012 0555   MCV 92.7 05/25/2012 0555   MCH 30.4 05/25/2012 0555   MCHC 32.8 05/25/2012 0555   RDW 16.8* 05/25/2012 0555   LYMPHSABS 2.1 04/24/2012 0530   MONOABS  1.5* 04/24/2012 0530   EOSABS 0.0 04/24/2012 0530   BASOSABS 0.0 04/24/2012 0530   BMET    Component Value Date/Time   NA 148* 05/26/2012 0650   K 3.7 05/26/2012 0650   CL 105 05/26/2012 0650   CO2 33* 05/26/2012 0650   GLUCOSE 147* 05/26/2012 0650   BUN 27* 05/26/2012 0650   CREATININE 0.88 05/26/2012 0650   CALCIUM 9.9 05/26/2012 0650   GFRNONAA 89* 05/26/2012 0650   GFRAA >90 05/26/2012 0650    DIAGNOSES:   Acute Hypoxic respiratory failure    HYPERLIPIDEMIA   TOBACCO USE   HYPERTENSION   GERD   COPD (chronic obstructive pulmonary disease)   Acute encephalopathy   Acute kidney injury   Atrial fibrillation, resolved   PE (pulmonary embolism)   Tracheostomy status   Hypernatremia   Refractory hypoxemia  PLAN:  PULMONARY A: 1) Acute hypercapnic respiratory failure/ARDS - secondary to flu, COPD exacerbation. Trach 1/22. 2) Aspiration PNA - at time of intubation, resolved 4) Pulmonary edema - improved. 4) Non-oxygen dependent COPD -  6) Upper airway severe angioedema & rash from protamine, resolved  P:  - Cont current Rx - wean O2 as tolerated - Downsize trach to #4   CARDIOVASCULAR  A:  1) New onset Afib with RVR - resolved 2/01 2) Hypotension - resolved 3) Anaphylaxis to protamine - resolved 4) Hypertension P:  - D/C amiodarone 2/16 - decrease diltiazem   RENAL  A:  1) Acute Kidney injury - Resolved.  2) Hypernatremia - resolved  3) Hypokalemia - resolved  P:  - Cont current Rx and monitoring - Increase free H2O  GASTROINTESTINAL  A:  1) H/O GERD 2) Diarrhea - resolved.  3) Colonic Ileus > resolved 4) Dysphagia P: - Continue TF, Pepcid. - Minimize narcotics. - Follow SLP recs  HEMATOLOGIC  A:  4) Bilateral PE 5) Bilateral DVT - IVC filter placement 1/27 P:  - Cont warfarin  INFECTIOUS  A: 1) Flu pneumonia - resolved 2) Aspirated at time of intubation, pneumonitis vs PNA - resolved 3) CVL infection - resolved 4) Diarrhea -  resolved. 5) Leukocytosis - likely 2/2 steroids. D/C prednisone P:  Monitor off abx   ENDOCRINE  A:  1) No prior hx of DM 2) mild hyperglycemia - Monitor CBGs - Resume SSI if > 180  NEUROLOGIC / PSYCHIATRIC   A:  1) Delirium, agitated, ? improved 2) Deconditioning  P:  - Increase seroquel - Mobilize, cont PT - D/C restraints if sitter can be arranged  Still too unpredictable to be able to move out of SDU Will need LTACH when O2 reqts improve sufficiently   Billy Fischer, MD ; [facility] service Mobile [phone].  After 5:30 PM or weekends, call [phone]

## 2012-05-28 LAB — GLUCOSE, CAPILLARY: Glucose-Capillary: 136 mg/dL — ABNORMAL HIGH (ref 70–99)

## 2012-05-28 LAB — CBC
HCT: 42.5 % (ref 39.0–52.0)
Hemoglobin: 13.2 g/dL (ref 13.0–17.0)
MCV: 93.8 fL (ref 78.0–100.0)
RDW: 16.5 % — ABNORMAL HIGH (ref 11.5–15.5)
WBC: 14.5 10*3/uL — ABNORMAL HIGH (ref 4.0–10.5)

## 2012-05-28 LAB — BASIC METABOLIC PANEL
CO2: 34 mEq/L — ABNORMAL HIGH (ref 19–32)
Chloride: 103 mEq/L (ref 96–112)
Creatinine, Ser: 0.91 mg/dL (ref 0.50–1.35)
GFR calc Af Amer: >90 mL/min (ref >90)
Potassium: 3.8 mEq/L (ref 3.5–5.1)

## 2012-05-28 LAB — PROTIME-INR: INR: 1.84 — ABNORMAL HIGH (ref 0.00–1.49)

## 2012-05-28 MED ORDER — JEVITY 1.2 CAL PO LIQD
1000.0000 mL | ORAL | Status: DC
  Filled 2012-05-28 (×5): qty 1000

## 2012-05-28 MED ORDER — WARFARIN SODIUM 5 MG PO TABS
5.0000 mg | ORAL_TABLET | Freq: Once | ORAL | Status: DC
  Administered 2012-05-28: 5 mg via ORAL
  Filled 2012-05-28 (×2): qty 1

## 2012-05-28 NOTE — Progress Notes (Addendum)
Speech Language Pathology Dysphagia and PMSVTreatment Patient Details Name: Cody [name] MRN: [id number] DOB: June 29, 1950 Today's Date: 05/28/2012 Time: 4098-1191 SLP Time Calculation (min): 40 min  Assessment / Plan / Recommendation Clinical Impression  Pt. seen for dysphagia and PMSV treatment with sitter present.  Sleeping upon SLP arrival, SLP assisted in repositioning pt. in bed and cleaned oral cavity removing mild amount of semi dry white mucous from soft palate.  Increased vocal cord adduction and force of air redirected to upper airway resulting in increased intensity.  He required moderate cues for coordination of respiration and phonation for deep inhalation and  limit expression to 1-2 words at a time as he mouths multiple words at once.  Dysphagia treatment included ice chips and tsp amounts of water followed by small bites applesauce with moderate-max verbal cues for 3 swallows and volitional throat clear.  ST will continue to follow for facilitation of verbal expression, contorlled po trials and pharyngeal strengthening exercises.  MD may pt. have PRN ice chips after thorough oral care with full supervision?    Diet Recommendation       SLP Plan Continue with current plan of care   Pertinent Vitals/Pain none   Swallowing Goals  SLP Swallowing Goals Goal #3: Pt. will perform exercises for improved tongue base and laryngeal ROM and strength with max visual/verbal/tactile cues. Swallow Study Goal #3 - Progress: Progressing toward goal Goal #4: Pt. will consume trials of puree only with SLP for increased strength during treatment with max verbal/visual cues. Swallow Study Goal #4 - Progress: Progressing toward goal  General Temperature Spikes Noted: No Respiratory Status: Trach Behavior/Cognition: Alert;Cooperative;Pleasant mood;Requires cueing Oral Cavity - Dentition: Missing dentition Patient Positioning: Upright in bed  Oral Cavity - Oral Hygiene Does patient have any  of the following "at risk" factors?: Oxygen therapy - cannula, mask, simple oxygen devices;Other - dysphagia (mild amount mucous on soft palate) Brush patient's teeth BID with toothbrush (using toothpaste with fluoride): Yes Patient is HIGH RISK - Oral Care Protocol followed (see row info): Yes   Dysphagia Treatment Treatment focused on: Skilled observation of diet tolerance Patient observed directly with PO's: Yes Type of PO's observed: Thin liquids;Ice chips;Dysphagia 1 (puree) Feeding: Able to feed self Liquids provided via: Teaspoon Pharyngeal Phase Signs & Symptoms: Suspected delayed swallow initiation Type of cueing: Verbal;Tactile;Visual Amount of cueing: Moderate   GO     Breck Coons [place].Ed [employer] [phone]  05/28/2012

## 2012-05-28 NOTE — Progress Notes (Signed)
Physical Therapy Treatment Patient Details Name: Cody [name] MRN: [id number] DOB: 1951/01/27 Today's Date: 05/28/2012 Time: 4098-1191 PT Time Calculation (min): 46 min  PT Assessment / Plan / Recommendation Comments on Treatment Session  Adm. for sob and resp fail due to pna with VDRF, trach and bronchoscopy 1/22. Ambulating well today with minA and progressing with goals. Continue to recommend CIR for intensive therapies to progress pt back to PLOF. Spoke with SLP [name] about potential for cognitive evalutation??    Follow Up Recommendations  CIR     Does the patient have the potential to tolerate intense rehabilitation     Barriers to Discharge        Equipment Recommendations  Rolling walker with 5" wheels    Recommendations for Other Services    Frequency Min 3X/week (+)   Plan Discharge plan remains appropriate;Frequency remains appropriate    Precautions / Restrictions Precautions Precautions: Fall Restrictions Weight Bearing Restrictions: No   Pertinent Vitals/Pain 95% spO2    Mobility  Bed Mobility Rolling Right: 4: Min assist Rolling Left: 4: Min assist Details for Bed Mobility Assistance: sequencing cues and facilitation for efficiency and follow through Transfers Transfers: Sit to Stand;Stand to Sit Sit to Stand: 4: Min assist;From bed;From chair/3-in-1;With armrests;With upper extremity assist Stand to Sit: 4: Min assist;To chair/3-in-1;With armrests;With upper extremity assist Details for Transfer Assistance: cues for safe hand placement as well as facilitation for anterior translation of weight over BOS Ambulation/Gait Ambulation/Gait Assistance: 4: Min assist Ambulation Distance (Feet): 30 Feet (15 ft x2) Assistive device: Rolling walker Ambulation/Gait Assistance Details: cues/facilitation for tall posture as well as safety cues with regards to use of RW, impulsivity and safety with turns; ambulated 15 ft x2 Gait Pattern: Trunk flexed;Shuffle;Narrow  base of support General Gait Details: decreased step height    Exercises General Exercises - Lower Extremity Long Arc Quad: AROM;Both;15 reps;Seated     PT Goals Acute Rehab PT Goals PT Goal: Rolling Supine to Right Side - Progress: Progressing toward goal PT Goal: Supine/Side to Sit - Progress: Progressing toward goal PT Goal: Sit to Stand - Progress: Progressing toward goal PT Goal: Stand to Sit - Progress: Progressing toward goal PT Transfer Goal: Bed to Chair/Chair to Bed - Progress: Progressing toward goal PT Goal: Stand - Progress: Progressing toward goal PT Goal: Ambulate - Progress: Progressing toward goal PT Goal: Perform Home Exercise Program - Progress: Progressing toward goal  Visit Information  Last PT Received On: 05/28/12 Assistance Needed: +2 Reason Eval/Treat Not Completed: Medical issues which prohibited therapy    Subjective Data  Subjective: I want a big cup of water!    Cognition  Cognition Overall Cognitive Status: Impaired Area of Impairment: Attention;Following commands;Safety/judgement;Awareness of errors;Awareness of deficits;Problem solving;Memory Arousal/Alertness: Awake/alert Orientation Level: Appears intact for tasks assessed Behavior During Session: [facility] for tasks performed (gets agitated quickly about water, needs redirection) Current Attention Level: Sustained;Selective Attention - Other Comments: able to select attention to ambulation but needs redirection to task as he becomes distracted  Memory Deficits: needs constant reminders of his situation and why he cannot have water (swallowing precautions) Following Commands: Follows one step commands consistently;Follows multi-step commands with increased time Safety/Judgement: Impulsive;Decreased awareness of need for assistance Awareness of Errors: Assistance required to correct errors made;Assistance required to identify errors made    Balance     End of Session PT - End of Session Equipment  Utilized During Treatment: Gait belt Activity Tolerance: Patient tolerated treatment well Patient left: in chair;with call  bell/phone within reach Radiographer, therapeutic present) Nurse Communication: Mobility status;Other (comment) (pt requesting water)   GP     [facility] HELEN 05/28/2012, 4:09 PM

## 2012-05-28 NOTE — Progress Notes (Signed)
PULMONARY  / CRITICAL CARE MEDICINE  Name: Cody [name] MRN: [id number] DOB: 27-Jul-1950    LOS: 46  REFERRING MD:   EDP - Dr. Preston Fleeting  CHIEF COMPLAINT:  Shortness of breath  BRIEF PATIENT DESCRIPTION: 56 yoM with COPD (3ppd smoking) admitted 1/5 with VDRF 2/2 influenza A and asp PNA, intubated 04/13/11 - 1/15 for AW protection, aspirated during intubation. Reintubated emergently 1/18 for severe delerium & resp distress, purulent secretions noted. Trach 1/22. New PE, DVT BL, s/p IVF filter 1/27.   LINES / TUBES: ETT 1/5 >>1/15 , 1/18 >> 1/21 self extubated, 1/21 >> 1/22 L IJ 1/5 >> 1/13 R IJ 1/13 >> 1/16 RIJ 1/18 >> 1/28 R A-line 1/5 >>1/8 pulled out by pt PICC 1/28 >> 2/15 (pt pulled it out) Janina Mayo 1/22 >>   CULTURES: Blood culture 1/5 >> Negative Resp culture 1/6 >> H. influenzae  Resp viral panel 1/6 >> Influenza A and Metapneumovirus  Sputum Leigonella Culture 1/6 >> Negative Bronch Sputum 1/6 >> H. influenzae  Flu 1/5 >> Negative Blood culture 01/13 >> Negative Catheter tip culture 1/13 >> Staph -coag neg-70 colonies C.diff PCR 1/14 >> Negative Resp culture 1/18 >> Negative Blood culture 2/6 >>NTD Urine 2/6 >> Enterobacter aerogenes   ANTIBIOTICS: Zosyn 1/5>>1/9 - restarted 1/22 >>1/28 Azithro 1/5>>1/8  Vanc 1/6>>>1/8 - restarted 1/13 >>1/14 Tamiflu 1/6>>>1/16  Rocephin 1/9>>1/15 Ceftazidine 1/18 >>1/22 Ceftaz 2/6>> 2/15  SIGNIFICANT EVENTS:  1/5 Intubated in ED  1/5 Shock , levophed  1/6 ECHO - 55%, mod dil RV, PA pressures not measured, no TR jet  1/6 Bronched- diffuse pus all lobes  1/7 increasing pCO2, increased RR on vent  1/8 Changed sedation to Precedex, more agitated than with versed  1/9 Changed sedation back to Versed, agitation greatly improved  1/10 scheduled haldol  1/12 Restarting precedex gtt due to persistent agitation 1/13 Febrile to 102.6 overnight. ? Catheter infection? DC'ed L IJ, placed new R IJ. 1/14 No fevers overnight, agitation  controlled on propofol (added 1/13), more interactive. 1/15 Had foul smelling stools overnight, therefore, C.diff PCR was sent by RN. 1/15 EXTUBATED, developed A.fib with RVR overnight. 1/16 Persistent episodes of desaturation., afib>started on cardizem drip  1/17 increased agitation  1/18 reintubated  1/20 Persistent A. Fib this morning on dilt drip.  Rates controlled between 99-125 1/21 Self extubated, given a chance and continued to be in respiratory distress so reintubated.  1/22 Tracheostomy, complicated by bleeding, angioedema from protamine 1/23 Neg acute ct head 1/24 PEG placement planned --> delayed bc of ileus 1/25 CTA showing PE BL --> started on heparin gtt. 1/26 BL LE DVT with mobile thrombus 1/30 Tolerated PMSV placement for 15 minutes and fatigue   2/20 transfer to telemetry   SUBJECTIVE / INTERVAL HISTORY:  No distress More cooperative  VITAL SIGNS: Temp:  [97.8 F (36.6 C)-98.8 F (37.1 C)] 97.8 F (36.6 C) (02/20 1100) Pulse Rate:  [66-94] 94 (02/20 1658) Resp:  [14-27] 22 (02/20 1658) BP: (77-111)/(57-74) 111/65 mmHg (02/20 1422) SpO2:  [91 %-100 %] 100 % (02/20 1658) FiO2 (%):  [35 %-40 %] 35 % (02/20 1658) Weight:  [80.4 kg (177 lb 4 oz)] 80.4 kg (177 lb 4 oz) (02/20 1422) HEMODYNAMICS: VENTILATOR SETTINGS: Vent Mode:  [-]  FiO2 (%):  [35 %-40 %] 35 % INTAKE / OUTPUT:  Intake/Output Summary (Last 24 hours) at 05/28/12 1835 Last data filed at 05/28/12 1820  Gross per 24 hour  Intake    783 ml  Output  550 ml  Net    233 ml   PHYSICAL EXAMINATION: FiO2 40% No resp distress RASS 0 HEENT WNL No JVD No wheezes RRR s M NABS No edema No focal deficits  CBC    Component Value Date/Time   WBC 14.5* 05/28/2012 0435   RBC 4.53 05/28/2012 0435   HGB 13.2 05/28/2012 0435   HCT 42.5 05/28/2012 0435   PLT 222 05/28/2012 0435   MCV 93.8 05/28/2012 0435   MCH 29.1 05/28/2012 0435   MCHC 31.1 05/28/2012 0435   RDW 16.5* 05/28/2012 0435   LYMPHSABS 2.1  04/24/2012 0530   MONOABS 1.5* 04/24/2012 0530   EOSABS 0.0 04/24/2012 0530   BASOSABS 0.0 04/24/2012 0530   BMET    Component Value Date/Time   NA 146* 05/28/2012 0435   K 3.8 05/28/2012 0435   CL 103 05/28/2012 0435   CO2 34* 05/28/2012 0435   GLUCOSE 152* 05/28/2012 0435   BUN 31* 05/28/2012 0435   CREATININE 0.91 05/28/2012 0435   CALCIUM 9.7 05/28/2012 0435   GFRNONAA 88* 05/28/2012 0435   GFRAA >90 05/28/2012 0435    DIAGNOSES:   Acute Hypoxic respiratory failure    COPD    PE    Tracheostomy status   Acute encephalopathy   Acute kidney injury, resolved   Atrial fibrillation, resolved   Hypernatremia, improving   Refractory hypoxemia   HYPERLIPIDEMIA   TOBACCO USE   HYPERTENSION   GERD PLAN:  PULMONARY A: 1) Acute hypercapnic respiratory failure/ARDS - secondary to flu, COPD exacerbation. Trach 1/22. 2) Aspiration PNA - at time of intubation, resolved 4) Pulmonary edema - improved. 4) Non-oxygen dependent COPD -  6) Upper airway severe angioedema & rash from protamine, resolved  P:  - Cont current Rx - wean O2 as tolerated - Downsize trach to #4   CARDIOVASCULAR  A:  1) New onset Afib with RVR - resolved 2/01 2) Hypotension - resolved 3) Anaphylaxis to protamine - resolved 4) Hypertension P:  - D/C amiodarone 2/16 - decrease diltiazem   RENAL  A:  1) Acute Kidney injury - Resolved.  2) Hypernatremia - resolved  3) Hypokalemia - resolved  P:  - Cont current Rx and monitoring - Increase free H2O   GASTROINTESTINAL  A:  1) H/O GERD 2) Diarrhea - resolved.  3) Colonic Ileus > resolved 4) Dysphagia P: - Continue TF, Pepcid. - Minimize narcotics. - Follow SLP recs  HEMATOLOGIC  A:  4) Bilateral PE 5) Bilateral DVT - IVC filter placement 1/27 P:  - Cont warfarin  INFECTIOUS  A: 1) Flu pneumonia - resolved 2) Aspirated at time of intubation, pneumonitis vs PNA - resolved 3) CVL infection - resolved 4) Diarrhea - resolved. 5)  Leukocytosis - likely 2/2 steroids. D/C prednisone P:  Monitor off abx   ENDOCRINE  A:  1) No prior hx of DM 2) mild hyperglycemia - Monitor CBGs - Resume SSI if > 180  NEUROLOGIC / PSYCHIATRIC   A:  1) Delirium, agitated, ? improved 2) Deconditioning  P:  - Increase seroquel - Mobilize, cont PT - Cont sitter   - Transfer to tele 2/20   Billy Fischer, MD ; [facility] service Mobile [phone].  After 5:30 PM or weekends, call [phone]

## 2012-05-28 NOTE — Progress Notes (Signed)
ANTICOAGULATION CONSULT NOTE - Follow Up Consult  Pharmacy Consult for warfarin Indication: pulmonary embolus and DVT  Allergies  Allergen Reactions  . Protamine Anaphylaxis    Patient Measurements: Height: 5\' 10"  (177.8 cm) Weight: 172 lb 6.4 oz (78.2 kg) IBW/kg (Calculated) : 73  Vital Signs: Temp: 98 F (36.7 C) (02/20 0730) Temp src: Oral (02/20 0730) BP: 86/58 mmHg (02/20 0730) Pulse Rate: 74 (02/20 0730)  Labs:  Recent Labs  05/26/12 0650 05/27/12 0425 05/28/12 0435  HGB  --   --  13.2  HCT  --   --  42.5  PLT  --   --  222  LABPROT 17.7* 18.6* 20.6*  INR 1.50* 1.61* 1.84*  CREATININE 0.88  --  0.91    Estimated Creatinine Clearance: 84.7 ml/min (by C-G formula based on Cr of 0.91).   Medications:  Scheduled:  . antiseptic oral rinse  15 mL Mouth Rinse QID  . chlorhexidine  15 mL Mouth Rinse BID  . clonazePAM  1 mg Per Tube BID  . free water  200 mL Per Tube Q6H  . levalbuterol  0.63 mg Nebulization Q6H  . metoprolol tartrate  25 mg Oral BID  . QUEtiapine  100 mg Per Tube BID  . [COMPLETED] warfarin  5 mg Oral ONCE-1800  . Warfarin - Pharmacist Dosing Inpatient   Does not apply q1800  . [DISCONTINUED] ipratropium  0.5 mg Nebulization Q6H  . [DISCONTINUED] multivitamin  5 mL Per Tube Daily   Infusions:  . sodium chloride 10 mL/hr at 05/19/12 1359  . feeding supplement (JEVITY 1.2 CAL) 40 mL (05/27/12 2059)    Assessment: 62 yo male with new PE/DVT (s/p IVC filter on 1/27). He has a trach. INR today is 1.84 on Coumadin day #8. Of note, amiodarone d/c'd 2/16. INR trend has been variable but would predict a coumadin maintenance regimen < 7.5mg /day.   Goal of Therapy:  INR 2-3 Monitor platelets by anticoagulation protocol: Yes   Plan:  1. Warfarin 5 mg PO x 1 tonight.   2. Daily PT/INR  3. Monitor for bleeding complications. 4. Begin Coumadin education as able with patient or family.  Harland German, Pharm D 05/28/2012 8:30 AM

## 2012-05-28 NOTE — Progress Notes (Signed)
PT Cancellation Note  Patient Details Name: Cody [name] MRN: [id number] DOB: 17-Sep-1950   Cancelled Treatment:    Reason Eval/Treat Not Completed: Medical issues which prohibited therapy. MAP: 56. Will check back as time and schedule allow.    [facility] HELEN 05/28/2012, 1:48 PM Pager: [phone]

## 2012-05-29 ENCOUNTER — Inpatient Hospital Stay (HOSPITAL_COMMUNITY)
Admission: RE | Admit: 2012-05-29 | Discharge: 2012-06-03 | DRG: 945 | Disposition: A | Discharge status: Other Acute Inpatient Hospital | Payer: Medicaid Other | Source: Intra-hospital | Attending: Physical Medicine & Rehabilitation | Admitting: Physical Medicine & Rehabilitation

## 2012-05-29 DIAGNOSIS — Z8673 Personal history of transient ischemic attack (TIA), and cerebral infarction without residual deficits: Secondary | ICD-10-CM

## 2012-05-29 DIAGNOSIS — J9600 Acute respiratory failure, unspecified whether with hypoxia or hypercapnia: Secondary | ICD-10-CM

## 2012-05-29 DIAGNOSIS — F17200 Nicotine dependence, unspecified, uncomplicated: Secondary | ICD-10-CM | POA: Diagnosis present

## 2012-05-29 DIAGNOSIS — J4489 Other specified chronic obstructive pulmonary disease: Secondary | ICD-10-CM | POA: Diagnosis present

## 2012-05-29 DIAGNOSIS — G9340 Encephalopathy, unspecified: Secondary | ICD-10-CM | POA: Diagnosis present

## 2012-05-29 DIAGNOSIS — G9341 Metabolic encephalopathy: Secondary | ICD-10-CM

## 2012-05-29 DIAGNOSIS — K560 Paralytic ileus: Secondary | ICD-10-CM | POA: Diagnosis present

## 2012-05-29 DIAGNOSIS — J9620 Acute and chronic respiratory failure, unspecified whether with hypoxia or hypercapnia: Secondary | ICD-10-CM | POA: Diagnosis present

## 2012-05-29 DIAGNOSIS — R5381 Other malaise: Secondary | ICD-10-CM

## 2012-05-29 DIAGNOSIS — I4891 Unspecified atrial fibrillation: Secondary | ICD-10-CM | POA: Diagnosis present

## 2012-05-29 DIAGNOSIS — Z5189 Encounter for other specified aftercare: Principal | ICD-10-CM

## 2012-05-29 DIAGNOSIS — R1310 Dysphagia, unspecified: Secondary | ICD-10-CM | POA: Diagnosis present

## 2012-05-29 DIAGNOSIS — J690 Pneumonitis due to inhalation of food and vomit: Secondary | ICD-10-CM | POA: Diagnosis present

## 2012-05-29 DIAGNOSIS — Z930 Tracheostomy status: Secondary | ICD-10-CM

## 2012-05-29 LAB — BASIC METABOLIC PANEL
CO2: 34 mEq/L — ABNORMAL HIGH (ref 19–32)
Calcium: 9.6 mg/dL (ref 8.4–10.5)
Creatinine, Ser: 0.82 mg/dL (ref 0.50–1.35)
GFR calc Af Amer: >90 mL/min (ref >90)
GFR calc non Af Amer: >90 mL/min (ref >90)
Sodium: 144 mEq/L (ref 135–145)

## 2012-05-29 LAB — PROTIME-INR
INR: 1.59 — ABNORMAL HIGH (ref 0.00–1.49)
Prothrombin Time: 18.5 seconds — ABNORMAL HIGH (ref 11.6–15.2)

## 2012-05-29 MED ORDER — ONDANSETRON HCL 4 MG PO TABS: 4.0000 mg | ORAL_TABLET | Freq: Four times a day (QID) | ORAL | Status: DC | PRN

## 2012-05-29 MED ORDER — FREE WATER
200.0000 mL | Freq: Four times a day (QID) | Status: DC
  Administered 2012-05-29 – 2012-05-30 (×3): 200 mL

## 2012-05-29 MED ORDER — ACETAMINOPHEN 325 MG PO TABS
325.0000 mg | ORAL_TABLET | ORAL | Status: DC | PRN
  Administered 2012-06-01: 650 mg
  Filled 2012-05-29: qty 2

## 2012-05-29 MED ORDER — ONDANSETRON HCL 4 MG/2ML IJ SOLN: 4.0000 mg | Freq: Four times a day (QID) | INTRAMUSCULAR | Status: DC | PRN

## 2012-05-29 MED ORDER — WARFARIN - PHARMACIST DOSING INPATIENT: Freq: Every day | Status: DC

## 2012-05-29 MED ORDER — WARFARIN SODIUM 7.5 MG PO TABS
7.5000 mg | ORAL_TABLET | Freq: Once | ORAL | Status: DC
  Filled 2012-05-29: qty 1

## 2012-05-29 MED ORDER — JEVITY 1.2 CAL PO LIQD
1000.0000 mL | ORAL | Status: DC
  Administered 2012-05-29: 1000 mL
  Filled 2012-05-29 (×3): qty 1000

## 2012-05-29 MED ORDER — QUETIAPINE FUMARATE 100 MG PO TABS
100.0000 mg | ORAL_TABLET | Freq: Two times a day (BID) | ORAL | Status: DC
  Administered 2012-05-29 – 2012-05-31 (×5): 100 mg
  Filled 2012-05-29 (×9): qty 1

## 2012-05-29 MED ORDER — CLONAZEPAM 0.5 MG PO TABS
1.0000 mg | ORAL_TABLET | Freq: Two times a day (BID) | ORAL | Status: DC
  Administered 2012-05-29 – 2012-06-03 (×10): 1 mg
  Filled 2012-05-29 (×10): qty 2

## 2012-05-29 MED ORDER — LEVALBUTEROL HCL 0.63 MG/3ML IN NEBU
0.6300 mg | INHALATION_SOLUTION | Freq: Four times a day (QID) | RESPIRATORY_TRACT | Status: DC
  Administered 2012-05-29 – 2012-06-03 (×18): 0.63 mg via RESPIRATORY_TRACT
  Filled 2012-05-29 (×23): qty 3

## 2012-05-29 MED ORDER — WARFARIN SODIUM 7.5 MG PO TABS
7.5000 mg | ORAL_TABLET | Freq: Once | ORAL | Status: AC
  Administered 2012-05-29: 7.5 mg via ORAL
  Filled 2012-05-29: qty 1

## 2012-05-29 MED ORDER — WARFARIN SODIUM 5 MG PO TABS: 5.0000 mg | ORAL_TABLET | Freq: Once | ORAL | Status: DC

## 2012-05-29 MED ORDER — METOPROLOL TARTRATE 25 MG PO TABS
25.0000 mg | ORAL_TABLET | Freq: Two times a day (BID) | ORAL | Status: DC
  Administered 2012-05-29 – 2012-06-02 (×9): 25 mg via ORAL
  Filled 2012-05-29 (×13): qty 1

## 2012-05-29 NOTE — Discharge Summary (Signed)
Physician Discharge Summary  Patient ID: Cody [name] MRN: [id number] DOB/AGE: 09/16/1950 62 y.o.  Admit date: 04/12/2012 Discharge date: 05/29/2012  Problem List Principal Problem:   Acute respiratory failure with hypercapnia Active Problems:   HYPERLIPIDEMIA   TOBACCO USE   HYPERTENSION   GERD   COPD (chronic obstructive pulmonary disease)   Acute encephalopathy   Acute kidney injury   Atrial fibrillation   PE (pulmonary embolism)   Tracheostomy status  HPI: 62 yoM with COPD (3ppd smoking) admitted 1/5 with VDRF 2/2 influenza A and asp PNA, intubated 04/13/11 - 1/15 for AW protection, aspirated during intubation. Reintubated emergently 1/18 for severe delerium & resp distress, purulent secretions noted. Trach 1/22. New PE, DVT BL, s/p IVF filter 1/27.   Hospital Course: LINES / TUBES:  ETT 1/5 >>1/15 , 1/18 >> 1/21 self extubated, 1/21 >> 1/22  L IJ 1/5 >> 1/13  R IJ 1/13 >> 1/16  RIJ 1/18 >> 1/28  R A-line 1/5 >>1/8 pulled out by pt  PICC 1/28 >> 2/15 (pt pulled it out)  Janina Mayo 1/22 >>  CULTURES:  Blood culture 1/5 >> Negative  Resp culture 1/6 >> H. influenzae  Resp viral panel 1/6 >> Influenza A and Metapneumovirus  Sputum Leigonella Culture 1/6 >> Negative  Bronch Sputum 1/6 >> H. influenzae  Flu 1/5 >> Negative  Blood culture 01/13 >> Negative  Catheter tip culture 1/13 >> Staph -coag neg-70 colonies  C.diff PCR 1/14 >> Negative  Resp culture 1/18 >> Negative  Blood culture 2/6 >>NTD  Urine 2/6 >> Enterobacter aerogenes  ANTIBIOTICS:  Zosyn 1/5>>1/9 - restarted 1/22 >>1/28  Azithro 1/5>>1/8  Vanc 1/6>>>1/8 - restarted 1/13 >>1/14  Tamiflu 1/6>>>1/16  Rocephin 1/9>>1/15  Ceftazidine 1/18 >>1/22  Ceftaz 2/6>> 2/15  SIGNIFICANT EVENTS:  1/5 Intubated in ED  1/5 Shock , levophed  1/6 ECHO - 55%, mod dil RV, PA pressures not measured, no TR jet  1/6 Bronched- diffuse pus all lobes  1/7 increasing pCO2, increased RR on vent  1/8 Changed sedation to Precedex,  more agitated than with versed  1/9 Changed sedation back to Versed, agitation greatly improved  1/10 scheduled haldol  1/12 Restarting precedex gtt due to persistent agitation  1/13 Febrile to 102.6 overnight. ? Catheter infection? DC'ed L IJ, placed new R IJ.  1/14 No fevers overnight, agitation controlled on propofol (added 1/13), more interactive.  1/15 Had foul smelling stools overnight, therefore, C.diff PCR was sent by RN.  1/15 EXTUBATED, developed A.fib with RVR overnight.  1/16 Persistent episodes of desaturation., afib>started on cardizem drip  1/17 increased agitation  1/18 reintubated  1/20 Persistent A. Fib this morning on dilt drip. Rates controlled between 99-125  1/21 Self extubated, given a chance and continued to be in respiratory distress so reintubated.  1/22 Tracheostomy, complicated by bleeding, angioedema from protamine  1/23 Neg acute ct head  1/24 PEG placement planned --> delayed bc of ileus  1/25 CTA showing PE BL --> started on heparin gtt.  1/26 BL LE DVT with mobile thrombus  1/30 Tolerated PMSV placement for 15 minutes and fatigue  2/20 transfer to telemetry   PLAN:  PULMONARY  A:  1) Acute hypercapnic respiratory failure/ARDS - secondary to flu, COPD exacerbation. Trach 1/22.  2) Aspiration PNA - at time of intubation, resolved  4) Pulmonary edema - improved.  4) Non-oxygen dependent COPD -  6) Upper airway severe angioedema & rash from protamine, resolved  P:  - Cont current Rx  -  wean O2 as tolerated  - Downsize trach to #4  CARDIOVASCULAR  A:  1) New onset Afib with RVR - resolved 2/01  2) Hypotension - resolved  3) Anaphylaxis to protamine - resolved  4) Hypertension  P:  - D/C amiodarone 2/16  - decrease diltiazem  RENAL  A:  1) Acute Kidney injury - Resolved.  2) Hypernatremia - resolved  3) Hypokalemia - resolved  P:  - Cont current Rx and monitoring  - Increase free H2O  GASTROINTESTINAL  A:  1) H/O GERD  2) Diarrhea -  resolved.  3) Colonic Ileus > resolved  4) Dysphagia  P:  - Continue TF, Pepcid.  - Minimize narcotics.  - Follow SLP recs  HEMATOLOGIC  A:  4) Bilateral PE  5) Bilateral DVT - IVC filter placement 1/27  P:  - Cont warfarin  INFECTIOUS  A:  1) Flu pneumonia - resolved  2) Aspirated at time of intubation, pneumonitis vs PNA - resolved  3) CVL infection - resolved  4) Diarrhea - resolved.  5) Leukocytosis - likely 2/2 steroids. D/C prednisone  P:  Monitor off abx  ENDOCRINE  A:  1) No prior hx of DM  2) mild hyperglycemia  - Monitor CBGs  - Resume SSI if > 180  NEUROLOGIC / PSYCHIATRIC  A:  1) Delirium, agitated, ? improved  2) Deconditioning  P:  - Increase seroquel  - Mobilize, cont PT          Labs at discharge Lab Results  Component Value Date   CREATININE 0.82 05/29/2012   BUN 28* 05/29/2012   NA 144 05/29/2012   K 3.7 05/29/2012   CL 103 05/29/2012   CO2 34* 05/29/2012   Lab Results  Component Value Date   WBC 14.5* 05/28/2012   HGB 13.2 05/28/2012   HCT 42.5 05/28/2012   MCV 93.8 05/28/2012   PLT 222 05/28/2012   Lab Results  Component Value Date   ALT 33 05/05/2012   AST 24 05/05/2012   ALKPHOS 72 05/05/2012   BILITOT 0.9 05/05/2012   Lab Results  Component Value Date   INR 1.59* 05/29/2012   INR 1.84* 05/28/2012   INR 1.61* 05/27/2012    Current radiology studies No results found.  Disposition:  Final discharge disposition not confirmed     Medication List    ASK your doctor about these medications       ADVIL PM 200-25 MG Caps  Generic drug:  Ibuprofen-Diphenhydramine HCl  Take 1 capsule by mouth at bedtime as needed. For sleep     amLODipine 5 MG tablet  Commonly known as:  NORVASC  Take 1 tablet (5 mg total) by mouth daily.     aspirin 81 MG tablet  Take 81 mg by mouth daily.     budesonide-formoterol 160-4.5 MCG/ACT inhaler  Commonly known as:  SYMBICORT  Inhale 2 puffs into the lungs 2 (two) times daily.      hydrochlorothiazide 25 MG tablet  Commonly known as:  HYDRODIURIL  Take 1 tablet (25 mg total) by mouth daily.     lisinopril 40 MG tablet  Commonly known as:  PRINIVIL,ZESTRIL  Take 1 tablet (40 mg total) by mouth daily.         Discharged Condition: fair Vital signs at Discharge. Temp:  [97.8 F (36.6 C)-98.6 F (37 C)] 98.6 F (37 C) (02/21 0520) Pulse Rate:  [78-101] 78 (02/21 0520) Resp:  [18-27] 18 (02/21 0520) BP: (96-113)/(63-75) 113/70 mmHg (  02/21 0520) SpO2:  [91 %-100 %] 95 % (02/21 0802) FiO2 (%):  [35 %-40 %] 40 % (02/21 0802) Weight:  [78.7 kg (173 lb 8 oz)-80.4 kg (177 lb 4 oz)] 78.7 kg (173 lb 8 oz) (02/21 0520) Office follow up Special Information or instructions. Per rehab. Signed: Brett Canales Minor ACNP Ad[facility] Pollack PCCM Pager [phone] till 3 pm If no answer page [phone] 05/29/2012, 10:26 AM   Agree with above. If needed, we are available PRN while he is on rehab service  Merwyn Katos, MD [phone]

## 2012-05-29 NOTE — Progress Notes (Signed)
Overall Plan of Care [facility]) Patient Details Name: Cody [name] MRN: [id number] DOB: 1950/12/01  Diagnosis:  Deconditioning after respiratory failure  Co-morbidities: copd, afib, ileus, delirium  Functional Problem List  Patient demonstrates impairments in the following areas: Balance, Behavior, Bladder, Bowel, Cognition, Endurance, Linguistic, Medication Management, Motor, Nutrition and Safety  Basic ADL's: eating, grooming, bathing, dressing and toileting Advanced ADL's: not applicable at this time  Transfers:  bed mobility, bed to chair, toilet, tub/shower and car Locomotion:  ambulation, wheelchair mobility and stairs  Additional Impairments:  Swallowing, Communication  comprehension and expression and Social Cognition   problem solving, memory, attention and awareness  Anticipated Outcomes Item Anticipated Outcome  Eating/Swallowing  supervision  Basic self-care  supervision  Tolieting  supervision  Bowel/Bladder  Min assist  Transfers  supervision  Locomotion  Gait using LRAD x 50' with supervision Up/Down 4 steps using handrails with supervision  Communication  Supervision  Cognition  Supervision  Pain  Mod ind  Safety/Judgment  Min assist   Other     Therapy Plan: PT Intensity: Minimum of 1-2 x/day ,45 to 90 minutes PT Frequency: 5 out of 7 days PT Duration Estimated Length of Stay: 10-14 days OT Intensity: Minimum of 1-2 x/day, 45 to 90 minutes OT Frequency: 5 out of 7 days OT Duration/Estimated Length of Stay: 10-14 days ST Intensity:  Minimum 1x per day, 30 minutes. ST Frquency:  5 out of 7 days. ST Duration/Estimated Length of Stay:  Approximately 2 weeks.       Team Interventions: Item RN PT OT SLP SW TR Other  Self Care/Advanced ADL Retraining   x      Neuromuscular Re-Education         Therapeutic Activities  x x x     UE/LE Strength Training/ROM  x x      UE/LE Coordination Activities  x x      Visual/Perceptual  Remediation/Compensation         DME/Adaptive Equipment Instruction  x x      Therapeutic Exercise  x x x     Balance/Vestibular Training  x x      Patient/Family Education x x x x     Cognitive Remediation/Compensation  x x x     Functional Mobility Training  x x      Ambulation/Gait Training  x       Psychologist, prison and probation services    x     Speech/Language Facilitation    x     Bladder Management x        Bowel Management x        Disease Management/Prevention x        Pain Management x        Medication Management x        Skin Care/Wound Management x        Splinting/Orthotics         Discharge Planning x  x x     Psychosocial Support   x x                            Team Discharge Planning: Destination: PT-Home ,OT- Home , SLP-  Projected Follow-up: PT-Home health PT,  OT-  Home health OT, SLP- Home Health Projected Equipment Needs: PT- , OT- Tub/shower seat, SLP- None Patient/family involved in discharge planning: PT- Patient,  OT-Patient, SLP- Patient  MD ELOS: 10-12 days Medical Rehab Prognosis:  Excellent Assessment: The patient has been admitted for CIR therapies. The team will be addressing, functional mobility, strength, stamina, balance, safety, adaptive techniques/equipment, self-care, bowel and bladder mgt, patient and caregiver education, cognitive perceptual awareness, communication, swallowing, restoration of sleep wake cycle, cognition. Goals have been set at supervision to minimal assist..      See Team Conference Notes for weekly updates to the plan of care

## 2012-05-29 NOTE — Interval H&P Note (Signed)
Cody [name] was admitted today to Inpatient Rehabilitation with the diagnosis of deconditioning after respiratory failure.  The patient's history has been reviewed, patient examined, and there is no change in status.  Patient continues to be appropriate for intensive inpatient rehabilitation.  I have reviewed the patient's chart and labs.  Questions were answered to the patient's satisfaction.  Kellin Bartling T 05/29/2012, 7:14 PM

## 2012-05-29 NOTE — Consult Note (Signed)
ANTICOAGULATION CONSULT NOTE  Pharmacy Consult for Warfarin Indication: pulmonary embolus and DVT  Allergies Allergies  Allergen Reactions  . Protamine Anaphylaxis    Patient Measurements: Ht : 70 inches Wt:  78.7 kg  Labs:  Recent Labs  05/27/12 0425 05/28/12 0435 05/29/12 0510  HGB  --  13.2  --   HCT  --  42.5  --   PLT  --  222  --   LABPROT 18.6* 20.6* 18.5*  INR 1.61* 1.84* 1.59*  CREATININE  --  0.91 0.82   Lab Results  Component Value Date   INR 1.59* 05/29/2012   INR 1.84* 05/28/2012   INR 1.61* 05/27/2012   Medications:  Prescriptions prior to admission  Medication Sig Dispense Refill  . amLODipine (NORVASC) 5 MG tablet Take 1 tablet (5 mg total) by mouth daily.  90 tablet  3  . aspirin 81 MG tablet Take 81 mg by mouth daily.        . budesonide-formoterol (SYMBICORT) 160-4.5 MCG/ACT inhaler Inhale 2 puffs into the lungs 2 (two) times daily.  1 Inhaler  3  . hydrochlorothiazide (HYDRODIURIL) 25 MG tablet Take 1 tablet (25 mg total) by mouth daily.  90 tablet  3  . Ibuprofen-Diphenhydramine HCl (ADVIL PM) 200-25 MG CAPS Take 1 capsule by mouth at bedtime as needed. For sleep      . lisinopril (PRINIVIL,ZESTRIL) 40 MG tablet Take 1 tablet (40 mg total) by mouth daily.  90 tablet  3   Assessment:  62 yo male with new PE/DVT (s/p IVC filter on 1/27). He has a trach. INR today is 1.59 on Coumadin day #9. Of note, amiodarone d/c'd 2/16. No bleeding noted.  Goal of Therapy:  Target INR 2-3    Plan:  Coumadin 7.5 mg today. Daily INR's, CBC.  Monitor for bleeding complications.  Dillard Pascal, Deetta Perla.D 05/29/2012, 5:13 PM

## 2012-05-29 NOTE — Progress Notes (Signed)
Admitting patient to inpatient rehab today. Dr Sung Amabile reports that pt is medically stable for d/c to CIR. Dr Riley Kill evaluated pt this AM and reports that pt is ready for CIR.  Please call for questions: [phone]

## 2012-05-29 NOTE — H&P (View-Only) (Signed)
Physical Medicine and Rehabilitation Admission H&P    Chief Complaint  Patient presents with  . Shortness of Breath  : HPI: Cody [name] is a 62 y.o. right-handed male with history of CVA, COPD with tobacco abuse. Admitted 04/12/2012 with increasing shortness of breath x4 days as well as productive cough occasional hemoptysis and increased confusion. Arrived in the ED in respiratory distress with oxygen saturation 60% on room air placed on NRB. Patient with increasing restlessness agitation and was intubated. Chest x-ray with acute pulmonary edema or atypical pneumonia. Placed on broad-spectrum antibiotics per critical care medicine as well as intravenous Solu-Medrol. Long-term ventilatory support with tracheostomy performed 04/29/2012 per Dr. [name] and presently has a #6 cuffless trach. Echocardiogram with ejection fraction 55% without PFO identified. Lower extremity Dopplers were negative on 04/13/2012 repeat 05/03/2012 showing acute DVT right saphenofemoral junction and right common femoral vein as well as left saphenofemoral junction femoral vein as well as CT angino chest 05/02/2012 showing pulmonary embolism within segmental branches to the right upper lobe, left upper lobe and left lower lobe.. An IVC filter was placed 05/04/2012 and IV heparin/Coumadin initiated 05/07/2012. Ongoing bouts of agitation and restlessness patient has received Ativan as well as Haldol with Seroquel twice daily. Wrist restraints in place as patient with multiple attempts to pull out tracheostomy tube . There was plan for gastrostomy tube placement for nutritional support however patient developed persistent ileus and procedure was placed on hold until 05/19/2012 with PEG tube placed per Dr. Ezzard Standing. Patient did develop new onset atrial fibrillation with RVR and resolved 05/09/2012. Patient had been on amiodarone discontinued 05/24/2012 and maintained currently on Lopressor. Physical and occupational therapy evaluations  completed 05/02/2012 ongoing patient with noted profound deconditioning as well as agitation confusion. Physical medicine rehabilitation consult requested to consider inpatient rehabilitation services  Review of Systems  Unable to perform ROS   Past Medical History  Diagnosis Date  . Stroke   . Hypertension   . Hyperlipidemia   . GERD (gastroesophageal reflux disease)   . COPD (chronic obstructive pulmonary disease)    Past Surgical History  Procedure Laterality Date  . Gastrostomy N/A 05/19/2012    Procedure: PEG Possible Open Gastrostomy;  Surgeon: Kandis Cocking, MD;  Location: [facility] OR;  Service: General;  Laterality: N/A;   Family History  Problem Relation Age of Onset  . COPD Mother    Social History:  reports that he has been smoking Cigarettes.  He has been smoking about 0.50 packs per day. He has never used smokeless tobacco. He reports that he does not drink alcohol or use illicit drugs. Allergies:  Allergies  Allergen Reactions  . Protamine Anaphylaxis   Medications Prior to Admission  Medication Sig Dispense Refill  . amLODipine (NORVASC) 5 MG tablet Take 1 tablet (5 mg total) by mouth daily.  90 tablet  3  . aspirin 81 MG tablet Take 81 mg by mouth daily.        . budesonide-formoterol (SYMBICORT) 160-4.5 MCG/ACT inhaler Inhale 2 puffs into the lungs 2 (two) times daily.  1 Inhaler  3  . hydrochlorothiazide (HYDRODIURIL) 25 MG tablet Take 1 tablet (25 mg total) by mouth daily.  90 tablet  3  . Ibuprofen-Diphenhydramine HCl (ADVIL PM) 200-25 MG CAPS Take 1 capsule by mouth at bedtime as needed. For sleep      . lisinopril (PRINIVIL,ZESTRIL) 40 MG tablet Take 1 tablet (40 mg total) by mouth daily.  90 tablet  3    Home:  Home Living Lives With: Spouse Available Help at Discharge: Family;Available 24 hours/day Type of Home: Mobile home Home Access: Stairs to enter Entrance Stairs-Number of Steps: 3 Entrance Stairs-Rails: Right;Left Home Layout: One level Bathroom  Shower/Tub: Health visitor: Standard Bathroom Accessibility: Yes How Accessible: Accessible via walker Home Adaptive Equipment: Built-in shower seat   Functional History: Prior Function Able to Take Stairs?: Yes Driving: Yes Vocation: Full time employment  Functional Status:  Mobility: Bed Mobility Bed Mobility: Rolling Left;Sit to Sidelying Left;Scooting to [facility];Rolling Right Rolling Right: 4: Min assist;With rail Rolling Right: Patient Percentage: 10% Rolling Left: 4: Min assist Rolling Left: Patient Percentage: 10% Left Sidelying to Sit: 4: Min assist Left Sidelying to Sit: Patient Percentage: 0% Supine to Sit: 4: Min assist;HOB flat;With rails Supine to Sit: Patient Percentage: 10% Sitting - Scoot to Edge of Bed: 4: Min assist Sitting - Scoot to [place] of Bed: Patient Percentage: 20% Sit to Supine: 4: Min assist;HOB flat Sit to Supine: Patient Percentage: 20% Sit to Sidelying Left: 3: Mod assist Scooting to HOB: 1: +2 Total assist Scooting to [facility]: Patient Percentage: 40% Transfers Transfers: Sit to Stand;Stand to Sit (3 trials) Sit to Stand: 3: Mod assist;With upper extremity assist;With armrests;From chair/3-in-1;From bed Sit to Stand: Patient Percentage:  (50-60%) Stand to Sit: 3: Mod assist;With upper extremity assist;With armrests;To chair/3-in-1;To bed Stand to Sit: Patient Percentage: 60% Stand Pivot Transfers: 1: +2 Total assist (bed->chair (to the right)) Stand Pivot Transfers: Patient Percentage: 30% Ambulation/Gait Ambulation/Gait Assistance: 4: Min assist;3: Mod assist Ambulation/Gait: Patient Percentage: 60% Ambulation Distance (Feet): 24 Feet Assistive device: Rolling walker Ambulation/Gait Assistance Details: occasionally needing mod facilitation for stability as pt very unsteady even with RW given his weakness; cues for tall posture with forward gaze as well as to increase his BOS for improved stability; sat after about 12 ft for rest as HR  elevated to 130s Gait Pattern: Trunk flexed;Narrow base of support;Decreased stride length General Gait Details: decreased step height Stairs: No Wheelchair Mobility Wheelchair Mobility: No  ADL: ADL Eating/Feeding: NPO Grooming: Performed;Brushing hair;Supervision/safety (vc for thoroughness) Where Assessed - Grooming: Unsupported sitting Upper Body Bathing: Simulated;Maximal assistance Where Assessed - Upper Body Bathing: Unsupported sitting Lower Body Bathing: Simulated;+1 Total assistance Where Assessed - Lower Body Bathing: Unsupported sitting Upper Body Dressing: Performed;Maximal assistance Where Assessed - Upper Body Dressing: Supine, head of bed up Lower Body Dressing: Performed;Minimal assistance;Other (comment) (for donning gripper socks only) Where Assessed - Lower Body Dressing: Unsupported sitting Toilet Transfer: Simulated;Moderate assistance Toilet Transfer Method: Stand pivot Acupuncturist:  (bed) Equipment Used: Gait belt;Rolling walker Transfers/Ambulation Related to ADLs: Pt requires mod assist for sit to stand and to take a few small steps up the edge of the bed or out in front away from the bed. ADL Comments: Pt able to transfer and sit EOB for 7 mins with min guard assist while using bilateral UEs to donn gripper socks.  Performed sit to stand x3 for 1-2 minute intervals, including stepping.  HR increased to 135 with standing and O2 decreased to 87% on 10Ls at 60% trach collar  Cognition: Cognition Arousal/Alertness: Awake/alert Orientation Level: Oriented to person;Oriented to place Cognition Overall Cognitive Status: Impaired Area of Impairment: Memory;Attention;Safety/judgement Difficult to assess due to: Tracheostomy Arousal/Alertness: Awake/alert Orientation Level: Appears intact for tasks assessed Behavior During Session: [facility] for tasks performed Current Attention Level: Selective Attention - Other Comments: affected by fatigue Following  Commands: Follows one step commands consistently;Follows multi-step commands with increased time Safety/Judgement:  Impulsive;Decreased awareness of need for assistance Safety/Judgement - Other Comments: needs several reminders of why he can't have food or anythign to drink Awareness of Errors: Assistance required to correct errors made;Assistance required to identify errors made Awareness of Deficits: needs reminders about his situation and why he not allowed to drink yet, impulsively trying to stand up without therapist there to help him needing reminders that he still needs help Problem Solving: min-mod functional basic Cognition - Other Comments: perseverating on water; was able to tell me to call his son and girlfriend today, wrote their numbers down on a sheet of paper  Physical Exam: Blood pressure 126/97, pulse 107, temperature 97.5 F (36.4 C), temperature source Oral, resp. rate 20, height 5\' 10"  (1.778 m), weight 77.3 kg (170 lb 6.7 oz), SpO2 98.00%. Physical Exam  Vitals reviewed.  HENT: oral mucosa pink, dry Head: Normocephalic and atraumatic.  Left Ear: External ear normal.  Eyes: Conjunctivae normal and EOM are normal. Pupils are equal, round, and reactive to light.  Neck: Normal range of motion. Neck supple.  Tracheostomy tube in place, #6 cuffless.. No drainage. No cap Cardiovascular:  No murmur heard. Cardiac rate controlled at present. No murmur or rub Pulmonary/Chest: No respiratory distress. He has no wheezes. No rales Decreased breath sounds to bases but clear to auscultation  Abdominal: Soft.  Abdomen is soft with positive bowel sounds. PEG tube in place  Neurological: He is alert.  Patient did attempt to mouth some words. Occasionally able to talk around trach in a whisper. He was easily distracted. He made good eye contact with examiner. He followed one and two-step demonstrated commands however inconsistent. UE 2+ to 3 proxm with deltoid, biceps, triceps to 4-  distally. LE are 2+ to 3HF and KE, 4 with ADF and APF. No gross sensory deficits.  Skin: Skin is warm and dry.  Numerous ecchymoses and bruises on arms, legs   Results for orders placed during the hospital encounter of 04/12/12 (from the past 48 hour(s))  GLUCOSE, CAPILLARY     Status: Abnormal   Collection Time    05/24/12  7:56 AM      Result Value Range   Glucose-Capillary 162 (*) 70 - 99 mg/dL   Comment 1 Notify RN    GLUCOSE, CAPILLARY     Status: Abnormal   Collection Time    05/24/12 12:02 PM      Result Value Range   Glucose-Capillary 178 (*) 70 - 99 mg/dL   Comment 1 Notify RN    GLUCOSE, CAPILLARY     Status: Abnormal   Collection Time    05/24/12  3:51 PM      Result Value Range   Glucose-Capillary 172 (*) 70 - 99 mg/dL   Comment 1 Notify RN    GLUCOSE, CAPILLARY     Status: Abnormal   Collection Time    05/24/12 10:09 PM      Result Value Range   Glucose-Capillary 149 (*) 70 - 99 mg/dL  PROTIME-INR     Status: Abnormal   Collection Time    05/25/12  5:55 AM      Result Value Range   Prothrombin Time 17.0 (*) 11.6 - 15.2 seconds   INR 1.42  0.00 - 1.49  BASIC METABOLIC PANEL     Status: Abnormal   Collection Time    05/25/12  5:55 AM      Result Value Range   Sodium 147 (*) 135 - 145 mEq/L   Potassium  3.5  3.5 - 5.1 mEq/L   Chloride 104  96 - 112 mEq/L   CO2 32  19 - 32 mEq/L   Glucose, Bld 141 (*) 70 - 99 mg/dL   BUN 27 (*) 6 - 23 mg/dL   Creatinine, Ser 1.61  0.50 - 1.35 mg/dL   Calcium 09.6  8.4 - 04.5 mg/dL   GFR calc non Af Amer >90  >90 mL/min   GFR calc Af Amer >90  >90 mL/min   Comment:            The eGFR has been calculated     using the CKD EPI equation.     This calculation has not been     validated in all clinical     situations.     eGFR's persistently     <90 mL/min signify     possible Chronic Kidney Disease.  CBC     Status: Abnormal   Collection Time    05/25/12  5:55 AM      Result Value Range   WBC 16.7 (*) 4.0 - 10.5 K/uL    RBC 5.06  4.22 - 5.81 MIL/uL   Hemoglobin 15.4  13.0 - 17.0 g/dL   HCT 40.9  81.1 - 91.4 %   MCV 92.7  78.0 - 100.0 fL   MCH 30.4  26.0 - 34.0 pg   MCHC 32.8  30.0 - 36.0 g/dL   RDW 78.2 (*) 95.6 - 21.3 %   Platelets 209  150 - 400 K/uL  GLUCOSE, CAPILLARY     Status: Abnormal   Collection Time    05/25/12  8:11 AM      Result Value Range   Glucose-Capillary 136 (*) 70 - 99 mg/dL  GLUCOSE, CAPILLARY     Status: Abnormal   Collection Time    05/25/12 12:08 PM      Result Value Range   Glucose-Capillary 133 (*) 70 - 99 mg/dL  GLUCOSE, CAPILLARY     Status: Abnormal   Collection Time    05/25/12  4:30 PM      Result Value Range   Glucose-Capillary 155 (*) 70 - 99 mg/dL  GLUCOSE, CAPILLARY     Status: Abnormal   Collection Time    05/25/12 11:46 PM      Result Value Range   Glucose-Capillary 142 (*) 70 - 99 mg/dL   Comment 1 Documented in Chart     Comment 2 Notify RN     No results found.  Post Admission Physician Evaluation: 1. Functional deficits secondary  to acute vent dependent respiratory failure with multiple medical complications and subsequent deconditioning and encephalopathy. 2. Patient is admitted to receive collaborative, interdisciplinary care between the physiatrist, rehab nursing staff, and therapy team. 3. Patient's level of medical complexity and substantial therapy needs in context of that medical necessity cannot be provided at a lesser intensity of care such as a SNF. 4. Patient has experienced substantial functional loss from his/her baseline which was documented above under the "Functional History" and "Functional Status" headings.  Judging by the patient's diagnosis, physical exam, and functional history, the patient has potential for functional progress which will result in measurable gains while on inpatient rehab.  These gains will be of substantial and practical use upon discharge  in facilitating mobility and self-care at the household  level. 5. Physiatrist will provide 24 hour management of medical needs as well as oversight of the therapy plan/treatment and provide guidance as appropriate regarding  the interaction of the two. 6. 24 hour rehab nursing will assist with bladder management, bowel management, safety, skin/wound care, disease management, medication administration, pain management and patient education  and help integrate therapy concepts, techniques,education, etc. 7. PT will assess and treat for/with: Lower extremity strength, range of motion, stamina, balance, functional mobility, safety, adaptive techniques and equipment, education.   Goals are: supervision. 8. OT will assess and treat for/with: ADL's, functional mobility, safety, upper extremity strength, adaptive techniques and equipment, education.   Goals are: supervision to min assist. 9. SLP will assess and treat for/with: cognition, communication, safety, swallowing.  Goals are: supervision. 10. Case Management and Social Worker will assess and treat for psychological issues and discharge planning. 11. Team conference will be held weekly to assess progress toward goals and to determine barriers to discharge. 12. Patient will receive at least 3 hours of therapy per day at least 5 days per week. 13. ELOS: 2-3 weeks      Prognosis:  good   Medical Problem List and Plan: 1. Severe deconditioning encephalopathy after CHF, [name]. 2. DVT Prophylaxis/Anticoagulation: Right lower extremity DVT/pulmonary emboli. IVC filter 05/04/2012 as well as Coumadin therapy. 3. Mood/ delirium. Seroquel 100 mg twice a day, Klonopin 1 mg twice a day. Check sleep chart. Restraints for safety  -wean meds, decrease restraints as soon as possible 4. Neuropsych: This patient is not capable of making decisions on his/her own behalf. 5. Tracheostomy tube. Status post 04/29/2012. Currently #6 cuffless trach in place. Speech therapy followup for PMV. 6.  Dysphagia. Status post gastrostomy PEG tube 05/19/2012. Continue nutritional support 7. COPD/tobacco abuse. Continue nebs as directed 8. New onset atrial fib/RVR. Continue Lopressor as directed with cardiac rate control 9. Recurrent ileus. Resolved and monitored  Ranelle Oyster, MD, [place] [facility]   05/26/2012

## 2012-05-29 NOTE — Progress Notes (Signed)
Spoke with [facility] x2  CN at 400 inquiring about pt,s bed is ready.  Instructed that pt still in the room and when d/c and room cleaned, nurse will call to get report.

## 2012-05-29 NOTE — Progress Notes (Signed)
Physical Therapy Treatment Patient Details Name: Cody [name] MRN: [id number] DOB: [birth year] Today's Date: 05/29/2012 Time: 1100-1135 PT Time Calculation (min): 35 min  PT Assessment / Plan / Recommendation Comments on Treatment Session  Adm. for sob and resp fail due to pna with VDRF, trach and bronchoscopy 1/22. Participation is excellent today and cognition appears functional today. Still demonstrating memory deficits and difficulty remembering he has swallowing precautions. Noted plan to is d/c to CIR today!     Follow Up Recommendations  CIR     Does the patient have the potential to tolerate intense rehabilitation     Barriers to Discharge        Equipment Recommendations  Rolling walker with 5" wheels    Recommendations for Other Services    Frequency Min 3X/week   Plan Discharge plan remains appropriate;Frequency remains appropriate    Precautions / Restrictions Precautions Precautions: Fall Precaution Comments: trach, peg Restrictions Weight Bearing Restrictions: No   Pertinent Vitals/Pain Denies pain    Mobility  Bed Mobility Bed Mobility: Sit to Supine Supine to Sit: 4: Min assist Sitting - Scoot to Edge of Bed: 5: Supervision Sit to Supine: 5: Supervision Details for Bed Mobility Assistance: sequencing cues, pulling on therapist to sit up; supervision only for sit->supine Transfers Transfers: Sit to Stand;Stand to Sit Sit to Stand: 4: Min assist;From bed;From chair/3-in-1;With upper extremity assist;With armrests Stand to Sit: 4: Min assist;To bed;To chair/3-in-1;With upper extremity assist;With armrests Details for Transfer Assistance: cues for hand placement, min facilitation for power up and stability Ambulation/Gait Ambulation/Gait Assistance: 4: Min assist Ambulation Distance (Feet): 50 Feet Assistive device: Rolling walker Ambulation/Gait Assistance Details: cues for tall posture as well as stability assist especially with turns; pt tends to trip  over his feet as he has very narrow BOS with decreased awareness of this and how it might affect his balance; fatigues quickly Gait Pattern: Trunk flexed;Shuffle General Gait Details: decreased step height      PT Goals Acute Rehab PT Goals PT Goal: Supine/Side to Sit - Progress: Progressing toward goal PT Goal: Sit to Supine/Side - Progress: Progressing toward goal PT Goal: Sit to Stand - Progress: Progressing toward goal PT Goal: Stand to Sit - Progress: Progressing toward goal PT Transfer Goal: Bed to Chair/Chair to Bed - Progress: Progressing toward goal PT Goal: Stand - Progress: Progressing toward goal PT Goal: Ambulate - Progress: Progressing toward goal  Visit Information  Last PT Received On: 05/29/12 Assistance Needed: +2 (+2 for equipment and chair follow)    Subjective Data  Subjective: I need ice water! They say I am going to rehab today. Patient Stated Goal: water   Cognition  Cognition Overall Cognitive Status: Impaired Area of Impairment: Attention;Memory;Safety/judgement;Problem solving Difficult to assess due to: Tracheostomy Arousal/Alertness: Awake/alert Orientation Level: Disoriented to;Time Behavior During Session: [facility] for tasks performed Current Attention Level: Selective Memory Deficits: still forgetting about his swallowing precautions Following Commands: Follows one step commands consistently;Follows multi-step commands consistently Safety/Judgement: Impulsive Awareness of Errors: Assistance required to identify errors made;Assistance required to correct errors made Cognition - Other Comments: much improved mentation today, able to tell me that he had been confused but that he is better now, knows he is going to rehab today; does impulsively do things at times needing safety cues    Balance  Dynamic Sitting Balance Dynamic Sitting - Balance Support: Left upper extremity supported Dynamic Sitting - Level of Assistance: 5: Stand by assistance Dynamic  Sitting Balance - Compensations: stood at sink  with left upper extremity support for approx 2 minutes but fatigues quickly needing to sit  End of Session PT - End of Session Equipment Utilized During Treatment: Gait belt Activity Tolerance: Patient tolerated treatment well Patient left: in bed;with call bell/phone within reach Nurse Communication: Mobility status   GP     [facility] HELEN 05/29/2012, 12:43 PM

## 2012-05-29 NOTE — Progress Notes (Signed)
ANTICOAGULATION CONSULT NOTE - Follow Up Consult  Pharmacy Consult for warfarin Indication: pulmonary embolus and DVT  Allergies  Allergen Reactions  . Protamine Anaphylaxis    Patient Measurements: Height: 5\' 10"  (177.8 cm) Weight: 173 lb 8 oz (78.7 kg) IBW/kg (Calculated) : 73  Vital Signs: Temp: 98.6 F (37 C) (02/21 0520) Temp src: Oral (02/21 0520) BP: 113/70 mmHg (02/21 0520) Pulse Rate: 78 (02/21 0520)  Labs:  Recent Labs  05/27/12 0425 05/28/12 0435 05/29/12 0510  HGB  --  13.2  --   HCT  --  42.5  --   PLT  --  222  --   LABPROT 18.6* 20.6* 18.5*  INR 1.61* 1.84* 1.59*  CREATININE  --  0.91 0.82    Estimated Creatinine Clearance: 94 ml/min (by C-G formula based on Cr of 0.82).   Medications:  Scheduled:  . antiseptic oral rinse  15 mL Mouth Rinse QID  . chlorhexidine  15 mL Mouth Rinse BID  . clonazePAM  1 mg Per Tube BID  . free water  200 mL Per Tube Q6H  . levalbuterol  0.63 mg Nebulization Q6H  . metoprolol tartrate  25 mg Oral BID  . QUEtiapine  100 mg Per Tube BID  . [COMPLETED] warfarin  5 mg Oral ONCE-1800  . Warfarin - Pharmacist Dosing Inpatient   Does not apply q1800   Infusions:  . sodium chloride 10 mL/hr at 05/19/12 1359  . feeding supplement (JEVITY 1.2 CAL) 1,000 mL (05/28/12 1300)  . [DISCONTINUED] feeding supplement (JEVITY 1.2 CAL) 40 mL (05/27/12 2059)    Assessment: 62 yo male with new PE/DVT (s/p IVC filter on 1/27). He has a trach. INR today is 1.59 on Coumadin day #9. Of note, amiodarone d/c'd 2/16. No bleeding noted.  Goal of Therapy:  INR 2-3 Monitor platelets by anticoagulation protocol: Yes   Plan:  1. Warfarin 7.5 mg PO x 1 tonight 2. Daily PT/INR  3. Monitor for bleeding complications.  [place], [address].D., BCPS Clinical Pharmacist Pager: [phone] 05/29/2012 10:02 AM

## 2012-05-29 NOTE — Progress Notes (Signed)
Patient received at 1634 via bed from 4700. Patient alert and oriented x3-4 . #4 cuffless trach intact . 35% trach collar intact. o2 sat at 90% . Patient required suctioning for moderate amount thick yellow secretions. Weak cough noted. Patient able to communicate effectively . Scrotum and buttocks ed skin excoriated . No complaint of pain . NSL in right hand . Peg intact to right abdomen with jevity infusing at 60cc/hr. Oriented patient to room and call bell system . Patient verbalized understanding of admission process. No family present at this time . Side rails up x 4 continue with plan of  Care.                Cleotilde Neer

## 2012-05-30 ENCOUNTER — Inpatient Hospital Stay (HOSPITAL_COMMUNITY): Payer: Medicaid Other | Admitting: Occupational Therapy

## 2012-05-30 ENCOUNTER — Inpatient Hospital Stay (HOSPITAL_COMMUNITY): Payer: Medicaid Other | Admitting: Physical Therapy

## 2012-05-30 ENCOUNTER — Inpatient Hospital Stay (HOSPITAL_COMMUNITY): Payer: Medicaid Other | Admitting: Speech Pathology

## 2012-05-30 DIAGNOSIS — G9341 Metabolic encephalopathy: Secondary | ICD-10-CM

## 2012-05-30 DIAGNOSIS — J9600 Acute respiratory failure, unspecified whether with hypoxia or hypercapnia: Secondary | ICD-10-CM

## 2012-05-30 DIAGNOSIS — R5381 Other malaise: Secondary | ICD-10-CM

## 2012-05-30 LAB — PROTIME-INR
INR: 1.56 — ABNORMAL HIGH (ref 0.00–1.49)
Prothrombin Time: 18.2 seconds — ABNORMAL HIGH (ref 11.6–15.2)

## 2012-05-30 MED ORDER — FREE WATER
250.0000 mL | Freq: Four times a day (QID) | Status: DC
  Administered 2012-05-30 – 2012-06-03 (×14): 250 mL

## 2012-05-30 MED ORDER — PRO-STAT SUGAR FREE PO LIQD
30.0000 mL | Freq: Three times a day (TID) | ORAL | Status: DC
  Administered 2012-05-30 – 2012-06-03 (×12): 30 mL
  Filled 2012-05-30 (×15): qty 30

## 2012-05-30 MED ORDER — JEVITY 1.2 CAL PO LIQD
1000.0000 mL | ORAL | Status: DC
  Administered 2012-05-30 – 2012-06-01 (×2): 1000 mL
  Filled 2012-05-30 (×7): qty 1000

## 2012-05-30 MED ORDER — SENNOSIDES-DOCUSATE SODIUM 8.6-50 MG PO TABS
2.0000 | ORAL_TABLET | Freq: Every day | ORAL | Status: DC
  Administered 2012-05-30 – 2012-06-02 (×3): 2 via ORAL
  Filled 2012-05-30 (×3): qty 2

## 2012-05-30 MED ORDER — BISACODYL 10 MG RE SUPP: 10.0000 mg | Freq: Every day | RECTAL | Status: DC | PRN

## 2012-05-30 MED ORDER — WARFARIN SODIUM 10 MG PO TABS
10.0000 mg | ORAL_TABLET | Freq: Once | ORAL | Status: AC
  Administered 2012-05-30: 10 mg via ORAL
  Filled 2012-05-30: qty 1

## 2012-05-30 NOTE — Progress Notes (Signed)
INITIAL NUTRITION ASSESSMENT  DOCUMENTATION CODES Per approved criteria  -Severe malnutrition in the context of acute illness or injury   INTERVENTION:  Adjust EN regimen to Jevity 1.2 at 70 ml/hr x 20 hours (off for therapies) with Prostat liquid protein 30 ml 3 times daily via tube to provide 1980 total kcals, 123 gm protein, 1130 ml of water  Continue free water flushes at 250 ml ever 6 hours RD to follow for nutrition care plan  NUTRITION DIAGNOSIS: Unintended weight loss related to acute hospitalization & immobility as evidenced by 22% weight loss  Goal: EN to meet >/= 90% of estimated nutrition needs  Monitor:  EN regimen & tolerance, weight, labs, I/O's  Reason for Assessment: Malnutrition Screening Tool Report, new TF  62 y.o. male  Admitting Dx: Severe deconditioning   ASSESSMENT: Patient known to Nutritional Management from acute hospitalization (04/12/12--04/28/12); [name], [name]; s/p PEG tube placement 05/19/2012.  Noted PT diagnosis: abnormal posture, difficulty walking and muscle weakness.Current EN regimen: Jevity 1.2 formula at 60 ml/hr PEG tube with free water flushes at 250 ml every 6 hours ---> RD to adjust regimen for therapies, RN aware.  Patient meets criteria for severe malnutrition in the context of acute illness or injury given 22% weight loss in < 2 months and severe muscle loss (shoulders, clavicles, thighs & calfs).  Height: Ht Readings from Last 1 Encounters:  05/28/12 5\' 10"  (1.778 m)    Weight: Wt Readings from Last 1 Encounters:  05/29/12 173 lb 8 oz (78.7 kg)    Ideal Body Weight: 166 lb  % Ideal Body Weight: 104%  Wt Readings from Last 10 Encounters:  05/29/12 173 lb 8 oz (78.7 kg)  05/29/12 173 lb 8 oz (78.7 kg)  03/25/12 229 lb (103.874 kg)  02/28/12 226 lb (102.513 kg)  09/09/11 212 lb (96.163 kg)  08/05/11 214 lb (97.07 kg)  06/18/11  218 lb (98.884 kg)  09/19/10 210 lb (95.255 kg)  05/28/10 211 lb (95.709 kg)  04/24/09 219 lb (99.338 kg)    Usual Body Weight: 229 lb ---> December 2013  % Usual Body Weight: 75%  BMI: 25.1 kg/m2  Estimated Nutritional Needs: Kcal: 1950-2150 Protein: 120-130 gm Fluid:1.9-2.1 L  Skin: Stage I pressure ulcer to sacrum  Diet Order: NPO  EDUCATION NEEDS: -No education needs identified at this time   Intake/Output Summary (Last 24 hours) at 05/30/12 1324 Last data filed at 05/30/12 0200  Gross per 24 hour  Intake    200 ml  Output    525 ml  Net   -325 ml    Last BM: 2/21  Labs:   Recent Labs Lab 05/26/12 0650 05/28/12 0435 05/29/12 0510  NA 148* 146* 144  K 3.7 3.8 3.7  CL 105 103 103  CO2 33* 34* 34*  BUN 27* 31* 28*  CREATININE 0.88 0.91 0.82  CALCIUM 9.9 9.7 9.6  GLUCOSE 147* 152* 160*    CBG (last 3)   Recent Labs  05/27/12 1639 05/28/12 0010 05/28/12 0739  GLUCAP 153* 139* 136*    Scheduled Meds: . clonazePAM  1 mg Per Tube BID  . free water  250 mL Per Tube Q6H  . levalbuterol  0.63 mg Nebulization Q6H  . metoprolol tartrate  25 mg Oral BID  . QUEtiapine  100 mg Per Tube BID  . senna-docusate  2 tablet Oral QHS  . warfarin  10 mg Oral  ONCE-1800  . Warfarin - Pharmacist Dosing Inpatient   Does not apply q1800    Continuous Infusions: . feeding supplement (JEVITY 1.2 CAL) 1,000 mL (05/29/12 1839)    Past Medical History  Diagnosis Date  . Stroke   . Hypertension   . Hyperlipidemia   . GERD (gastroesophageal reflux disease)   . COPD (chronic obstructive pulmonary disease)     Past Surgical History  Procedure Laterality Date  . Gastrostomy N/A 05/19/2012    Procedure: PEG Possible Open Gastrostomy;  Surgeon: Kandis Cocking, MD;  Location: [facility] OR;  Service: General;  Laterality: N/A;    Cody [name], RD, LDN Pager #: [phone] After-Hours Pager #: [phone]

## 2012-05-30 NOTE — Consult Note (Signed)
ANTICOAGULATION CONSULT NOTE  Pharmacy Consult for Warfarin Indication: pulmonary embolus and DVT  Allergies Allergies  Allergen Reactions  . Protamine Anaphylaxis    Patient Measurements: Ht : 70 inches Wt:  78.7 kg  Labs:  Recent Labs  05/28/12 0435 05/29/12 0510 05/30/12 0730  HGB 13.2  --   --   HCT 42.5  --   --   PLT 222  --   --   LABPROT 20.6* 18.5* 18.2*  INR 1.84* 1.59* 1.56*  CREATININE 0.91 0.82  --    Lab Results  Component Value Date   INR 1.56* 05/30/2012   INR 1.59* 05/29/2012   INR 1.84* 05/28/2012   Medications:  Prescriptions prior to admission  Medication Sig Dispense Refill  . amLODipine (NORVASC) 5 MG tablet Take 1 tablet (5 mg total) by mouth daily.  90 tablet  3  . aspirin 81 MG tablet Take 81 mg by mouth daily.        . budesonide-formoterol (SYMBICORT) 160-4.5 MCG/ACT inhaler Inhale 2 puffs into the lungs 2 (two) times daily.  1 Inhaler  3  . hydrochlorothiazide (HYDRODIURIL) 25 MG tablet Take 1 tablet (25 mg total) by mouth daily.  90 tablet  3  . Ibuprofen-Diphenhydramine HCl (ADVIL PM) 200-25 MG CAPS Take 1 capsule by mouth at bedtime as needed. For sleep      . lisinopril (PRINIVIL,ZESTRIL) 40 MG tablet Take 1 tablet (40 mg total) by mouth daily.  90 tablet  3   Assessment:  62 yo male with new PE/DVT (s/p IVC filter on 1/27). He has a trach. INR 1.56 down. Of note, amiodarone d/c'd 2/16. No bleeding noted.  Goal of Therapy:  Target INR 2-3    Plan:  Coumadin 10 mg today. Daily INR's, CBC.  Monitor for bleeding complications.  Timotheus Salm S. Merilynn Finland, PharmD, BCPS Clinical Staff Pharmacist Pager [phone]  Gray Bernhardt.D 05/30/2012, 9:15 AM

## 2012-05-30 NOTE — Evaluation (Signed)
Physical Therapy Assessment and Plan  Patient Details  Name: ZALEN SEQUEIRA MRN: [id number] Date of Birth: 1950-06-13  PT Diagnosis: Abnormal posture, Difficulty walking and Muscle weakness Rehab Potential: Good ELOS: 10-14 days   Today's Date: 05/30/2012 Time: 0800-0900 Time Calculation (min): 60 min  Problem List:  Patient Active Problem List  Diagnosis  . HYPERLIPIDEMIA  . TOBACCO USE  . HYPERTENSION  . GERD  . CEREBROVASCULAR ACCIDENT, HX OF  . COPD (chronic obstructive pulmonary disease)  . Acute encephalopathy  . Acute kidney injury  . Acute respiratory failure with hypercapnia  . Atrial fibrillation  . PE (pulmonary embolism)  . Tracheostomy status    Past Medical History:  Past Medical History  Diagnosis Date  . Stroke   . Hypertension   . Hyperlipidemia   . GERD (gastroesophageal reflux disease)   . COPD (chronic obstructive pulmonary disease)    Past Surgical History:  Past Surgical History  Procedure Laterality Date  . Gastrostomy N/A 05/19/2012    Procedure: PEG Possible Open Gastrostomy;  Surgeon: Kandis Cocking, MD;  Location: [facility] OR;  Service: General;  Laterality: N/A;    Assessment & Plan Clinical Impression: JUANDIEGO KOLENOVIC is a 62 y.o. right-handed male with history of CVA, COPD with tobacco abuse. Admitted 04/12/2012 with increasing shortness of breath x4 days as well as productive cough occasional hemoptysis and increased confusion. Arrived in the ED in respiratory distress with oxygen saturation 60% on room air placed on NRB. Patient with increasing restlessness agitation and was intubated. Chest x-ray with acute pulmonary edema or atypical pneumonia. Placed on broad-spectrum antibiotics per critical care medicine as well as intravenous Solu-Medrol. Long-term ventilatory support with tracheostomy performed 04/29/2012 per Dr. [name] and presently has a #6 cuffless trach. Echocardiogram with ejection fraction 55% without PFO identified. Lower  extremity Dopplers were negative on 04/13/2012 repeat 05/03/2012 showing acute DVT right saphenofemoral junction and right common femoral vein as well as left saphenofemoral junction femoral vein as well as CT angino chest 05/02/2012 showing pulmonary embolism within segmental branches to the right upper lobe, left upper lobe and left lower lobe.. An IVC filter was placed 05/04/2012 and IV heparin/Coumadin initiated 05/07/2012. Ongoing bouts of agitation and restlessness patient has received Ativan as well as Haldol with Seroquel twice daily. Wrist restraints in place as patient with multiple attempts to pull out tracheostomy tube . There was plan for gastrostomy tube placement for nutritional support however patient developed persistent ileus and procedure was placed on hold until 05/19/2012 with PEG tube placed per Dr. Ezzard Standing. Patient did develop new onset atrial fibrillation with RVR and resolved 05/09/2012. Patient had been on amiodarone discontinued 05/24/2012 and maintained currently on Lopressor. Physical and occupational therapy evaluations completed 05/02/2012 ongoing patient with noted profound deconditioning as well as agitation confusion.   Patient transferred to CIR on 05/29/2012 .   Patient currently requires mod with mobility secondary to muscle weakness and decreased cardiorespiratoy endurance and decreased oxygen support.  Prior to hospitalization, patient was independent  with mobility and lived with Spouse in a Mobile home home.  Home access is 3Stairs to enter.  Patient will benefit from skilled PT intervention to maximize safe functional mobility, minimize fall risk and decrease caregiver burden for planned discharge home with 24 hour supervision.  Anticipate patient will benefit from follow up HH at discharge.  PT - End of Session Activity Tolerance: Tolerates 10 - 20 min activity with multiple rests Endurance Deficit: Yes Endurance Deficit Description: Lethargic during PT  Evaluation  lying in bed but participation improved when sitting EOB and in w/c PT Assessment Rehab Potential: Good Barriers to Discharge: Decreased caregiver support PT Plan PT Intensity: Minimum of 1-2 x/day ,45 to 90 minutes PT Frequency: 5 out of 7 days PT Duration Estimated Length of Stay: 10-14 days PT Treatment/Interventions: Ambulation/gait training;Balance/vestibular training;DME/adaptive equipment instruction;Functional mobility training;Patient/family education;Stair training;Therapeutic Activities;Therapeutic Exercise;UE/LE Strength taining/ROM PT Recommendation Follow Up Recommendations: Home health PT Patient destination: Home  Skilled Therapeutic Intervention   PT Evaluation Precautions/Restrictions Precautions Precautions: Fall Precaution Comments:  (Trach and PEG tube) Restrictions Weight Bearing Restrictions: No General Chart Reviewed: Yes Family/Caregiver Present: No Vital SignsTherapy Vitals Temp: 97.4 F (36.3 C) Temp src: Oral Pulse Rate: 75 Resp: 18 BP: 110/73 mmHg Oxygen Therapy SpO2: 97 % O2 Device: Trach collar O2 Flow Rate (L/min): 10 L/min Pain Pain Assessment Pain Assessment: No/denies pain (at rest) Patients Stated Pain Goal: 2 Home Living/Prior Functioning Home Living Lives With: Spouse Available Help at Discharge: Family;Available 24 hours/day Type of Home: Mobile home Home Access: Stairs to enter Entrance Stairs-Number of Steps: 3 Entrance Stairs-Rails: Right;Left Home Layout: One level Bathroom Shower/Tub: Health visitor: Standard Bathroom Accessibility: Yes How Accessible: Accessible via walker Home Adaptive Equipment: Built-in shower seat Prior Function Able to Take Stairs?: Yes Driving: Yes Vocation: Full time employment Vision/Perception  Vision - History Baseline Vision: Wears glasses all the time Patient Visual Report: No change from baseline  Cognition Arousal/Alertness: Lethargic Orientation Level: Oriented  to person;Oriented to place (assessment difficult due to low phonation, illegible writing) Sensation Sensation Light Touch: Appears Intact Coordination Finger Nose Finger Test: impaired by undershooting but shows smooth linear tracking Motor  Motor Motor: Within Functional Limits  Mobility Bed Mobility Bed Mobility: Right Sidelying to Sit Rolling Right: 4: Min assist;With rail Rolling Right Details: Tactile cues for placement;Verbal cues for technique Rolling Left: 4: Min assist;With rail Rolling Left Details: Tactile cues for placement;Verbal cues for technique Right Sidelying to Sit: 3: Mod assist;With rails;HOB elevated (HOB elevated 30 degrees) Right Sidelying to Sit Details: Tactile cues for placement;Verbal cues for technique;Manual facilitation for weight shifting Sitting - Scoot to Edge of Bed: 4: Min assist;With rail Sitting - Scoot to [place] of Bed Details: Tactile cues for placement;Verbal cues for technique;Manual facilitation for weight shifting Sit to Supine: 4: Min guard (multiple lines) Scooting to HOB: 1: +2 Total assist Transfers Sit to Stand: 4: Min assist;From elevated surface;With upper extremity assist Sit to Stand Details: Tactile cues for placement;Verbal cues for technique;Manual facilitation for weight shifting Stand to Sit: 4: Min assist;With armrests;To chair/3-in-1 Stand to Sit Details (indicate cue type and reason): Tactile cues for placement;Verbal cues for technique;Verbal cues for precautions/safety;Manual facilitation for placement Stand Pivot Transfers: 4: Min assist Stand Pivot Transfer Details: Tactile cues for placement;Verbal cues for technique;Verbal cues for precautions/safety;Manual facilitation for weight shifting;Manual facilitation for placement Locomotion  Ambulation Ambulation: Yes Ambulation/Gait Assistance: 4: Min assist Ambulation Distance (Feet): 30 Feet Assistive device: Rolling walker Ambulation/Gait Assistance Details: Tactile  cues for placement;Verbal cues for precautions/safety;Manual facilitation for weight shifting Ambulation/Gait Assistance Details:  (patient had decreased hip flexion L LE with swing phase) Gait Gait: Yes Gait Pattern: Impaired Gait Pattern: Step-through pattern;Decreased step length - right;Decreased step length - left;Decreased hip/knee flexion - left;Trunk flexed;Shuffle Stairs / Additional Locomotion Stairs: No Corporate treasurer: No  Trunk/Postural Assessment  Cervical Assessment Cervical Assessment: Within Functional Limits Thoracic Assessment Thoracic Assessment: Exceptions to [facility] (flexed) Lumbar Assessment Lumbar Assessment: Exceptions to  WFL (flexed)  Balance Balance Balance Assessed: Yes Extremity Assessment  RUE Assessment RUE Assessment: Exceptions to [facility] RUE AROM (degrees) RUE Overall AROM Comments: shoulder flexion to 120, distally wfl RUE Strength RUE Overall Strength Comments: shoulder 3-/5, elbow 3+/5, grasp 3+/5 LUE Assessment LUE Assessment: Exceptions to WFL LUE AROM (degrees) LUE Overall AROM Comments: shoulder flexion 100, distally wfl LUE Strength LUE Overall Strength Comments: shoulder 3-/5, elbow and grasp 3/5 RLE Assessment RLE Assessment: Within Functional Limits  LLE Assessment LLE Assessment: Exceptions to [facility] (Left hip flexion 3-/5 and knee extension 3+/5)  FIM:  FIM - Bed/Chair Transfer Bed/Chair Transfer: 4: Chair or W/C > Bed: Min A (steadying Pt. > 75%) FIM - Locomotion: Ambulation Ambulation/Gait Assistance: 4: Min assist   Refer to Care Plan for Long Term Goals  Recommendations for other services: None  Discharge Criteria: Patient will be discharged from PT if patient refuses treatment 3 consecutive times without medical reason, if treatment goals not met, if there is a change in medical status, if patient makes no progress towards goals or if patient is discharged from hospital.  The above assessment, treatment  plan, treatment alternatives and goals were discussed and mutually agreed upon: by patient  Rex Kras 05/30/2012, 12:51 PM

## 2012-05-30 NOTE — Evaluation (Signed)
Occupational Therapy Assessment and Plan  Patient Details  Name: Cody [name] MRN: [id number] Date of Birth: Mar 08, 1951  OT Diagnosis: cognitive deficits and muscle weakness (generalized) Rehab Potential: Rehab Potential: Good ELOS: 10-14 days   Today's Date: 05/30/2012 Time: 0907-1000 and 1400-1430 Time Calculation (min): 53 min and 30 min  Visit 1: No c/o pain. Pt seen for initial evaluation and BADL retraining with ADL transfers and grooming. Pt refused to bathe as he felt cold and did not have any clothing with him.  He was upset about not being able to drink water and was somewhat impulsive with his movements.  Otherwise, he was fully oriented, able to answer questions and follow directions well. Pt was very fatigued from PT session and asked to go back to bed.  Soft waist belt applied to pt in bed.  Visit 2:  No c/o pain.  Pt's fiancee and son had arrived at start of session. Pt sat up to EOB with min assist and worked on [name] with walker 3x.  Min assist to stand and [name] tolerance only 30 seconds at a time.  With approval from SLP, pt was provided with ice chips with Cody [name] valve in place.  Pt needed cues to use small spoonfuls as he tried several times to dump the ice in his mouth.  Pt was also able to stand with the walker and take several steps to his w/c.  Quick release belt applied and pt left in room with family.  Problem List:  Patient Active Problem List  Diagnosis  . HYPERLIPIDEMIA  . TOBACCO USE  . HYPERTENSION  . GERD  . CEREBROVASCULAR ACCIDENT, HX OF  . COPD (chronic obstructive pulmonary disease)  . Acute encephalopathy  . Acute kidney injury  . Acute respiratory failure with hypercapnia  . Atrial fibrillation  . PE (pulmonary embolism)  . Tracheostomy status    Past Medical History:  Past Medical History  Diagnosis Date  . Stroke   . Hypertension   . Hyperlipidemia   . GERD (gastroesophageal reflux disease)   . COPD (chronic obstructive  pulmonary disease)    Past Surgical History:  Past Surgical History  Procedure Laterality Date  . Gastrostomy N/A 05/19/2012    Procedure: PEG Possible Open Gastrostomy;  Surgeon: Cody Cocking, MD;  Location: [facility] OR;  Service: General;  Laterality: N/A;    Assessment & Plan Clinical Impression:Cody [name] is a 62 y.o. right-handed male with history of CVA, COPD with tobacco abuse. Admitted 04/12/2012 with increasing shortness of breath x4 days as well as productive cough occasional hemoptysis and increased confusion. Arrived in the ED in respiratory distress with oxygen saturation 60% on room air placed on NRB. Patient with increasing restlessness agitation and was intubated. Chest x-ray with acute pulmonary edema or atypical pneumonia. Placed on broad-spectrum antibiotics per critical care medicine as well as intravenous Solu-Medrol. Long-term ventilatory support with tracheostomy performed 04/29/2012 per Dr. Tyson [name] and presently has a #6 cuffless trach. Echocardiogram with ejection fraction 55% without PFO identified. Lower extremity Dopplers were negative on 04/13/2012 repeat 05/03/2012 showing acute DVT right saphenofemoral junction and right common femoral vein as well as left saphenofemoral junction femoral vein as well as CT angino chest 05/02/2012 showing pulmonary embolism within segmental branches to the right upper lobe, left upper lobe and left lower lobe.. An IVC filter was placed 05/04/2012 and IV heparin/Coumadin initiated 05/07/2012. Ongoing bouts of agitation and restlessness patient has received Ativan as well as Haldol with Seroquel twice  daily. Wrist restraints in place as patient with multiple attempts to pull out tracheostomy tube . There was plan for gastrostomy tube placement for nutritional support however patient developed persistent ileus and procedure was placed on hold until 05/19/2012 with PEG tube placed per Dr. Ezzard [name]. Patient did develop new onset atrial fibrillation  with RVR and resolved 05/09/2012. Patient had been on amiodarone discontinued 05/24/2012 and maintained currently on Lopressor. Physical and occupational therapy evaluations completed 05/02/2012 ongoing patient with noted profound deconditioning as well as agitation confusion. Physical medicine rehabilitation consult requested to consider inpatient rehabilitation services Patient transferred to CIR on 05/29/2012 .    Patient currently requires min with ADL transfers (self care to be evaluated further) secondary to muscle weakness, decreased oxygen support, decreased safety awareness and decreased [name] balance.  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  Patient will benefit from skilled intervention to increase independence with basic self-care skills prior to discharge home with care partner.  Anticipate patient will require 24 hour supervision and follow up home health.  OT - End of Session Activity Tolerance: Tolerates < 10 min activity, no significant change in vital signs Endurance Deficit: Yes Endurance Deficit Description: very fatigued, can not tolerate sitting up for long periods of time OT Assessment Rehab Potential: Good Barriers to Discharge: None OT Plan OT Intensity: Minimum of 1-2 x/day, 45 to 90 minutes OT Frequency: 5 out of 7 days OT Duration/Estimated Length of Stay: 10-14 days OT Treatment/Interventions: Balance/vestibular training;Discharge planning;DME/adaptive equipment instruction;Functional mobility training;Patient/family education;Therapeutic Activities;Therapeutic Exercise;Self Care/advanced ADL retraining;UE/LE Strength taining/ROM;UE/LE Coordination activities OT Recommendation Patient destination: Home Follow Up Recommendations: Home health OT Equipment Recommended: Tub/shower seat   Skilled Therapeutic Intervention   OT Evaluation   Home Living/Prior Functioning Home Living Lives With: [place] Available Help  at Discharge: Family Type of Home: Mobile home Home Access: Stairs to enter Secretary/administrator of Steps: 3 Entrance Stairs-Rails: Right;Left Home Layout: One level Bathroom Shower/Tub: Health visitor: Standard Bathroom Accessibility: Yes How Accessible: Accessible via walker Home Adaptive Equipment: Built-in shower seat Prior Function Able to Take Stairs?: Yes Driving: Yes Vocation: Full time employment ADL   To be evaluated further - pt refused bath, did not have clothing to put on Vision/Perception  Vision - Assessment Eye Alignment: Within Functional Limits Perception Perception: Not tested Praxis Praxis: Not tested  Cognition Behaviors: Restless;Impulsive; decreased safety awareness Sensation Sensation Proprioception: Appears Intact Coordination Gross Motor Movements are Fluid and Coordinated: Yes Fine Motor Movements are Fluid and Coordinated: No Coordination and Movement Description: impaired FMC with writing Motor  Motor Motor - Skilled Clinical Observations: generalized muscle weakness Mobility  Bed Mobility Bed Mobility: Right Sidelying to Sit Rolling Right: 4: Min assist;With rail Rolling Right Details: Tactile cues for placement;Verbal cues for technique Rolling Left: 4: Min assist;With rail Rolling Left Details: Tactile cues for placement;Verbal cues for technique Right Sidelying to Sit: 3: Mod assist;With rails;HOB elevated (HOB elevated 30 degrees) Right Sidelying to Sit Details: Tactile cues for placement;Verbal cues for technique;Manual facilitation for weight shifting Sitting - Scoot to Edge of Bed: 4: Min assist;With rail Sitting - Scoot to [place] of Bed Details: Tactile cues for placement;Verbal cues for technique;Manual facilitation for weight shifting Sit to Supine: 4: Min guard (multiple lines) Scooting to HOB: 1: +2 Total assist Transfers Sit to Stand: 4: Min assist;From elevated surface;With upper extremity assist Sit to  Stand Details: Tactile cues for placement;Verbal cues for technique;Manual facilitation for weight shifting Stand to  Sit: 4: Min assist;With armrests;To chair/3-in-1 Stand to Sit Details (indicate cue type and reason): Tactile cues for placement;Verbal cues for technique;Verbal cues for precautions/safety;Manual facilitation for placement  Trunk/Postural Assessment  Cervical Assessment Cervical Assessment: Within Functional Limits Thoracic Assessment Thoracic Assessment: Exceptions to [facility] (flexed) Lumbar Assessment Lumbar Assessment: Exceptions to [facility] (flexed)  Balance Balance Balance Assessed: Yes Dynamic Sitting Balance Dynamic Sitting - Level of Assistance: 4: Min assist Static [name] Balance Static [name] - Level of Assistance: 4: Min assist Dynamic [name] Balance Dynamic [name] - Level of Assistance: 4: Min assist Extremity/Trunk Assessment RUE Assessment RUE Assessment: Exceptions to [facility] RUE AROM (degrees) RUE Overall AROM Comments: shoulder flexion to 120, distally wfl RUE Strength RUE Overall Strength Comments: shoulder 3-/5, elbow 3+/5, grasp 3+/5 LUE Assessment LUE Assessment: Exceptions to WFL LUE AROM (degrees) LUE Overall AROM Comments: shoulder flexion 100, distally wfl LUE Strength LUE Overall Strength Comments: shoulder 3-/5, elbow and grasp 3/5  FIM:  FIM - Grooming Grooming Steps: Wash, rinse, dry face Grooming: 2: Patient completes 1 of 4 or 2 of 5 steps FIM - Bathing Bathing: 1: Total-Patient completes 0-2 of 10 parts or less than 25% FIM - Upper Body Dressing/Undressing Upper body dressing/undressing: 0: Wears gown/pajamas-no public clothing FIM - Lower Body Dressing/Undressing Lower body dressing/undressing: 0: Wears Oceanographer FIM - Games developer Transfer: 4: Chair or W/C > Bed: Min A (steadying Pt. > 75%) FIM - Diplomatic Services operational officer Devices: Bedside commode Toilet Transfers: 4-To  toilet/BSC: Min A (steadying Pt. > 75%);4-From toilet/BSC: Min A (steadying Pt. > 75%)   Refer to Care Plan for Long Term Goals  Recommendations for other services: None and Neuropsych  Discharge Criteria: Patient will be discharged from OT if patient refuses treatment 3 consecutive times without medical reason, if treatment goals not met, if there is a change in medical status, if patient makes no progress towards goals or if patient is discharged from hospital.  The above assessment, treatment plan, treatment alternatives and goals were discussed and mutually agreed upon: by patient and by family  Kylo Gavin 05/30/2012, 12:37 PM

## 2012-05-30 NOTE — Progress Notes (Signed)
Subjective/Complaints: No specific complaints last night. No agitation or restlessness per RN, mouth is dry A 12 point review of systems has been performed and if not noted above is otherwise negative.   Objective: Vital Signs: Blood pressure 110/73, pulse 75, temperature 97.4 F (36.3 C), temperature source Oral, resp. rate 18, SpO2 97.00%. No results found.  Recent Labs  05/28/12 0435  WBC 14.5*  HGB 13.2  HCT 42.5  PLT 222    Recent Labs  05/28/12 0435 05/29/12 0510  NA 146* 144  K 3.8 3.7  CL 103 103  GLUCOSE 152* 160*  BUN 31* 28*  CREATININE 0.91 0.82  CALCIUM 9.7 9.6   CBG (last 3)   Recent Labs  05/27/12 1639 05/28/12 0010 05/28/12 0739  GLUCAP 153* 139* 136*    Wt Readings from Last 3 Encounters:  05/29/12 78.7 kg (173 lb 8 oz)  05/29/12 78.7 kg (173 lb 8 oz)  03/25/12 103.874 kg (229 lb)    Physical Exam:  HENT: oral mucosa pink, dry especially tongue Head: Normocephalic and atraumatic.  Left Ear: External ear normal.  Eyes: Conjunctivae normal and EOM are normal. Pupils are equal, round, and reactive to light.  Neck: Normal range of motion. Neck supple.  Tracheostomy tube in place, #6 cuffless.. No drainage. No cap  Cardiovascular:  No murmur heard. Cardiac rate controlled at present. No murmur or rub  Pulmonary/Chest: No respiratory distress. He has no wheezes. No rales Decreased breath sounds to bases but clear to auscultation  Abdominal: Soft.  Abdomen is soft with positive bowel sounds. PEG tube in place  Neurological: He is alert.  Patient   able to talk around trach in a whisper. He remains easily distracted. He made good eye contact with examiner. He followed one and two-step demonstrated commands however inconsistent. UE 2+ to 3 proxm with deltoid, biceps, triceps to 4- distally. LE are 2+ to 3HF and KE, 4 with ADF and APF. No gross sensory deficits.  Skin: Skin is warm and dry.  Numerous ecchymoses and bruises on arms,  legs   Assessment/Plan: 1. Functional deficits secondary to deconditioning after vent dependent respiratory failure and multiple medical issues with associated encephalopathy which require 3+ hours per day of interdisciplinary therapy in a comprehensive inpatient rehab setting. Physiatrist is providing close team supervision and 24 hour management of active medical problems listed below. Physiatrist and rehab team continue to assess barriers to discharge/monitor patient progress toward functional and medical goals. FIM:                   Comprehension Comprehension Mode: Auditory Comprehension: 5-Follows basic conversation/direction: With no assist  Expression Expression Mode: Verbal Expression Assistive Devices: 6-Talk trach valve Expression: 4-Expresses basic 75 - 89% of the time/requires cueing 10 - 24% of the time. Needs helper to occlude trach/needs to repeat words.  Social Interaction Social Interaction: 5-Interacts appropriately 90% of the time - Needs monitoring or encouragement for participation or interaction.  Problem Solving Problem Solving: 3-Solves basic 50 - 74% of the time/requires cueing 25 - 49% of the time  Memory Memory: 5-Recognizes or recalls 90% of the time/requires cueing < 10% of the time  Medical Problem List and Plan:  1. Severe deconditioning encephalopathy after CHF, [name].  2. DVT Prophylaxis/Anticoagulation: Right lower extremity DVT/pulmonary emboli. IVC filter 05/04/2012 as well as Coumadin therapy.  3. Mood/ delirium. Seroquel 100 mg twice a day, Klonopin 1 mg twice a day. Check sleep chart. Restraints  for safety  -wean meds, decrease restraints as soon as possible  4. Neuropsych: This patient is not capable of making decisions on his/her own behalf.  5. Tracheostomy tube. Status post 04/29/2012. Currently #6 cuffless trach in place. Speech therapy followup for PMV.  6. Dysphagia. Status post gastrostomy PEG  tube 05/19/2012. Continue nutritional support  7. COPD/tobacco abuse. Continue nebs as directed  8. New onset atrial fib/RVR. Continue Lopressor as directed with cardiac rate control  9. Recurrent ileus. Resolved and monitored  LOS (Days) 1 A FACE TO FACE EVALUATION WAS PERFORMED  SWARTZ,ZACHARY T 05/30/2012 7:46 AM

## 2012-05-30 NOTE — Evaluation (Signed)
Speech Language Pathology Assessment and Plan  Patient Details  Name: Cody [name] MRN: [id number] Date of Birth: [birth year]  SLP Diagnosis: Dysphagia  Rehab Potential: Good ELOS: Approximately two weeks.   Today's Date: 05/30/2012 Time: 7829-5621 Time Calculation (min): 60 min  Problem List:  Patient Active Problem List  Diagnosis  . HYPERLIPIDEMIA  . TOBACCO USE  . HYPERTENSION  . GERD  . CEREBROVASCULAR ACCIDENT, HX OF  . COPD (chronic obstructive pulmonary disease)  . Acute encephalopathy  . Acute kidney injury  . Acute respiratory failure with hypercapnia  . Atrial fibrillation  . PE (pulmonary embolism)  . Tracheostomy status   Past Medical History:  Past Medical History  Diagnosis Date  . Stroke   . Hypertension   . Hyperlipidemia   . GERD (gastroesophageal reflux disease)   . COPD (chronic obstructive pulmonary disease)    Past Surgical History:  Past Surgical History  Procedure Laterality Date  . Gastrostomy N/A 05/19/2012    Procedure: PEG Possible Open Gastrostomy;  Surgeon: Kandis Cocking, MD;  Location: [facility] OR;  Service: General;  Laterality: N/A;    Assessment / Plan / Recommendation Clinical Impression  The patient is a 62 year-old male with history of CVA, COPD with tobacco abuse.   He was admitted 04/12/2012, with increasing shortness of breath x4 days as well as productive cough with occasional hemoptysis and increased confusion.  The patient was diagnosed with pneumonia.  He eventually had a cuffless trach placed as well as PEG tube secondary to severe dysphagia noted on MBS study dated 05/22/12.  He will now be seen for dysphagia therapy.     SLP Assessment  Patient will need skilled Speech Lanaguage Pathology Services during CIR admission    Recommendations  Patient may use Passy-Muir Speech Valve: with SLP only PMSV Supervision: Full Diet Recommendations: Alternative means - temporary Recommendations for Other Services: Neuropsych  consult Patient destination: Home Follow up Recommendations: Home Health SLP Equipment Recommended: None recommended by SLP    SLP Frequency 5 out of 7 days   SLP Treatment/Interventions Therapeutic Exercise;Dysphagia/aspiration precaution training    Pain Pain Assessment Pain Score: 10-Worst pain ever Pain Location: Buttocks Pain Descriptors: Aching;Burning;Constant Pain Onset: On-going Pain Intervention(s): Repositioned Prior Functioning Available Help at Discharge: Family  Short Term Goals: Week 1: SLP Short Term Goal 1 (Week 1): Patient will perform pharyngeal strengthening exercises with min-A verbal and tactile cues.  SLP Short Term Goal 2 (Week 1): Patient will consume trials of various consistencies with SLP, with PMV in place, without signs/symptoms of aspiration.   See FIM for current functional status Refer to Care Plan for Long Term Goals  Recommendations for other services: Neuropsych  Discharge Criteria: Patient will be discharged from SLP if patient refuses treatment 3 consecutive times without medical reason, if treatment goals not met, if there is a change in medical status, if patient makes no progress towards goals or if patient is discharged from hospital.  The above assessment, treatment plan, treatment alternatives and goals were discussed and mutually agreed upon: by patient.  Lenny Pastel 05/30/2012, 6:03 PM

## 2012-05-31 ENCOUNTER — Inpatient Hospital Stay (HOSPITAL_COMMUNITY): Payer: Medicaid Other | Admitting: Occupational Therapy

## 2012-05-31 LAB — PROTIME-INR
INR: 1.82 — ABNORMAL HIGH (ref 0.00–1.49)
Prothrombin Time: 20.4 seconds — ABNORMAL HIGH (ref 11.6–15.2)

## 2012-05-31 MED ORDER — WARFARIN SODIUM 10 MG PO TABS
10.0000 mg | ORAL_TABLET | Freq: Once | ORAL | Status: AC
  Administered 2012-05-31: 10 mg via ORAL
  Filled 2012-05-31: qty 1

## 2012-05-31 MED ORDER — BIOTENE DRY MOUTH MT LIQD
15.0000 mL | Freq: Two times a day (BID) | OROMUCOSAL | Status: DC
  Administered 2012-06-01 – 2012-06-02 (×4): 15 mL via OROMUCOSAL

## 2012-05-31 MED ORDER — CHLORHEXIDINE GLUCONATE 0.12 % MT SOLN
15.0000 mL | Freq: Two times a day (BID) | OROMUCOSAL | Status: DC
  Administered 2012-05-31 – 2012-06-03 (×6): 15 mL via OROMUCOSAL
  Filled 2012-05-31 (×8): qty 15

## 2012-05-31 NOTE — Progress Notes (Signed)
Subjective/Complaints: Did well last night. Awoke once around 3am but otherwise slept. A 12 point review of systems has been performed and if not noted above is otherwise negative.   Objective: Vital Signs: Blood pressure 91/53, pulse 69, temperature 98.5 F (36.9 C), temperature source Oral, resp. rate 18, SpO2 95.00%. No results found. No results found for this basename: WBC, HGB, HCT, PLT,  in the last 72 hours  Recent Labs  05/29/12 0510  NA 144  K 3.7  CL 103  GLUCOSE 160*  BUN 28*  CREATININE 0.82  CALCIUM 9.6   CBG (last 3)   Recent Labs  05/28/12 0739  GLUCAP 136*    Wt Readings from Last 3 Encounters:  05/29/12 78.7 kg (173 lb 8 oz)  05/29/12 78.7 kg (173 lb 8 oz)  03/25/12 103.874 kg (229 lb)    Physical Exam:  HENT: oral mucosa pink, dry especially tongue Head: Normocephalic and atraumatic.  Left Ear: External ear normal.  Eyes: Conjunctivae normal and EOM are normal. Pupils are equal, round, and reactive to light.  Neck: Normal range of motion. Neck supple.  Tracheostomy tube in place, #6 cuffless.. No drainage. No cap  Cardiovascular:  No murmur heard. Cardiac rate controlled at present. No murmur or rub  Pulmonary/Chest: No respiratory distress. He has no wheezes. No rales Decreased breath sounds to bases but clear to auscultation  Abdominal: Soft.  Abdomen is soft with positive bowel sounds. PEG tube in place  Neurological: He is alert.  Patient   able to talk around trach in a whisper. He remains easily distracted. He made good eye contact with examiner. He followed one and two-step demonstrated commands however inconsistent. UE 2+ to 3 proxm with deltoid, biceps, triceps to 4- distally. LE are 2+ to 3HF and KE, 4 with ADF and APF. No gross sensory deficits.  Skin: Skin is warm and dry.  Numerous ecchymoses and bruises on arms, legs   Assessment/Plan: 1. Functional deficits secondary to deconditioning after vent dependent respiratory failure  and multiple medical issues with associated encephalopathy which require 3+ hours per day of interdisciplinary therapy in a comprehensive inpatient rehab setting. Physiatrist is providing close team supervision and 24 hour management of active medical problems listed below. Physiatrist and rehab team continue to assess barriers to discharge/monitor patient progress toward functional and medical goals. FIM: FIM - Bathing Bathing: 1: Total-Patient completes 0-2 of 10 parts or less than 25%  FIM - Upper Body Dressing/Undressing Upper body dressing/undressing: 0: Wears gown/pajamas-no public clothing FIM - Lower Body Dressing/Undressing Lower body dressing/undressing: 0: Wears Oceanographer     FIM - Diplomatic Services operational officer Devices: Psychiatrist Transfers: 4-To toilet/BSC: Min A (steadying Pt. > 75%);4-From toilet/BSC: Min A (steadying Pt. > 75%)  FIM - Bed/Chair Transfer Bed/Chair Transfer: 4: Chair or W/C > Bed: Min A (steadying Pt. > 75%)  FIM - Locomotion: Ambulation Locomotion: Ambulation Assistive Devices: Walker - Rolling Ambulation/Gait Assistance: 4: Min assist  Comprehension Comprehension Mode: Auditory Comprehension: 5-Follows basic conversation/direction: With extra time/assistive device  Expression Expression Mode: Verbal Expression Assistive Devices: 6-Talk trach valve Expression: 3-Expresses basic 50 - 74% of the time/requires cueing 25 - 50% of the time. Needs to repeat parts of sentences.  Social Interaction Social Interaction: 2-Interacts appropriately 25 - 49% of time - Needs frequent redirection.  Problem Solving Problem Solving: 4-Solves basic 75 - 89% of the time/requires cueing 10 - 24% of the time  Memory Memory: 5-Recognizes or recalls  90% of the time/requires cueing < 10% of the time  Medical Problem List and Plan:  1. Severe deconditioning encephalopathy after CHF, [name].   2. DVT Prophylaxis/Anticoagulation: Right lower extremity DVT/pulmonary emboli. IVC filter 05/04/2012 as well as Coumadin therapy.  3. Mood/ delirium. Seroquel 100 mg twice a day, Klonopin 1 mg twice a day.   sleep chart.  -wean meds this coming week potentially -did not require restraints yesterday or last night 4. Neuropsych: This patient is not capable of making decisions on his/her own behalf.  5. Tracheostomy tube. Status post 04/29/2012. Currently #6 cuffless trach in place. Speech therapy followup for PMV.  6. Dysphagia. Status post gastrostomy PEG tube 05/19/2012. Continue nutritional support   -increased H2O flushes to 250 cc yesterday 7. COPD/tobacco abuse. Continue nebs as directed  8. New onset atrial fib/RVR. Continue Lopressor as directed-rate controlled currently  9. Recurrent ileus. Resolved and monitored  LOS (Days) 2 A FACE TO FACE EVALUATION WAS PERFORMED  Cody [name] 05/31/2012 7:34 AM

## 2012-05-31 NOTE — Plan of Care (Signed)
Problem: RH BOWEL ELIMINATION Goal: RH STG MANAGE BOWEL WITH ASSISTANCE STG Manage Bowel with mod Assistance.  Outcome: Progressing Cont BMs on 2/22 and 2/23 using BSC

## 2012-05-31 NOTE — Plan of Care (Signed)
Problem: RH BLADDER ELIMINATION Goal: RH STG MANAGE BLADDER WITH ASSISTANCE STG Manage Bladder With min Assistance  Cont using urinal with assistance

## 2012-05-31 NOTE — Plan of Care (Signed)
Problem: RH SAFETY Goal: RH STG ADHERE TO SAFETY PRECAUTIONS W/ASSISTANCE/DEVICE STG Adhere to Safety Precautions With max Assistance/Device.  Outcome: Progressing Pt appropriate without restraints Saturday and Sunday. One attempt OOB Saturday, bed alarm effective.

## 2012-05-31 NOTE — Progress Notes (Signed)
Occupational Therapy Session Note  Patient Details  Name: Cody [name] MRN: [id number] Date of Birth: 1950/07/08  Today's Date: 05/31/2012 Time: 4098-1191 Time Calculation (min): 45 min  Short Term Goals: Week 1:  OT Short Term Goal 1 (Week 1): Pt will don clothing with min assist. OT Short Term Goal 2 (Week 1): Pt will be able to ambulate 10 steps into bathroom with min assist. OT Short Term Goal 3 (Week 1): Pt will demonstrate improve [facility] with writing his name clearly.  Skilled Therapeutic Interventions/Progress Updates:  Patient in bed upon arrival and engaged in bed mobility, orientation, dynamic sitting and standing balance as well as sitting & standing tolerance, LB dressing, activity tolerance, UB strengthening.  Issued Theraband yet patient only agreed to perform 1 exercise yet stated he would do some more later.  Patient able to sit EOB without assistance except to be certain he did not pull out lines or tubes, stated that he has been in the hospital for 9 months however patient's daughter stated it has been more like 9 weeks.  After considerable convincing, patient agreed that 9 weeks might be more accurate.  Patient declined to put on clothes however, he did agree to don his boxers which included standing to pull them up.  Patient stood 2 times to improve strength and endurance and stated that he wants to strengthen his trunk.  Patient's daughter is a Pharmacist, hospital and she asked if patient could do some exercises in bed with ankle weights.  She plans to bring some in however reviewed concern for increased fatigue and decreased endurance so maybe give him a few days on rehab and let his primary therapist help to guide extra exercises.  Therapy Documentation Precautions:  Precautions Precautions: Fall Precaution Comments:  (Trach and PEG tube) Restrictions Weight Bearing Restrictions: No Pain: Denies pain just soreness in trunk due to "coughing" ADL: See FIM for current  functional status  Therapy/Group: Individual Therapy  Madalynne Gutmann 05/31/2012, 4:14 PM

## 2012-05-31 NOTE — Progress Notes (Signed)
Slept well. Congested cough at 0300-deep suction and able to cough up thick tan/green sputum. Trach care provided at that time. O2 sats 93-98% on 40% TC. Residuals from PEG <2ml at 2000 and 2400. Agreed to oral care times 2 this shift, otherwise refusing. Declined to turn,but Able to turn self from back to left side. Without complaint of pain. Scrotum and sacrum with yeast like rash-microgaurd powder applied. No unsafe behaviors observed. Cody [name] A

## 2012-05-31 NOTE — Consult Note (Signed)
ANTICOAGULATION CONSULT NOTE  Pharmacy Consult for Warfarin Indication: pulmonary embolus and DVT  Allergies Allergies  Allergen Reactions  . Protamine Anaphylaxis    Patient Measurements: Ht : 70 inches Wt:  78.7 kg  Labs:  Recent Labs  05/29/12 0510 05/30/12 0730 05/31/12 0655  LABPROT 18.5* 18.2* 20.4*  INR 1.59* 1.56* 1.82*  CREATININE 0.82  --   --    Lab Results  Component Value Date   INR 1.82* 05/31/2012   INR 1.56* 05/30/2012   INR 1.59* 05/29/2012   Medications:  Prescriptions prior to admission  Medication Sig Dispense Refill  . amLODipine (NORVASC) 5 MG tablet Take 1 tablet (5 mg total) by mouth daily.  90 tablet  3  . aspirin 81 MG tablet Take 81 mg by mouth daily.        . budesonide-formoterol (SYMBICORT) 160-4.5 MCG/ACT inhaler Inhale 2 puffs into the lungs 2 (two) times daily.  1 Inhaler  3  . hydrochlorothiazide (HYDRODIURIL) 25 MG tablet Take 1 tablet (25 mg total) by mouth daily.  90 tablet  3  . Ibuprofen-Diphenhydramine HCl (ADVIL PM) 200-25 MG CAPS Take 1 capsule by mouth at bedtime as needed. For sleep      . lisinopril (PRINIVIL,ZESTRIL) 40 MG tablet Take 1 tablet (40 mg total) by mouth daily.  90 tablet  3   Assessment:  62 yo male with new PE/DVT (s/p IVC filter on 1/27). He has a trach. INR 1.82 down. Of note, amiodarone d/c'd 2/16. No bleeding noted.  Goal of Therapy:  Target INR 2-3    Plan:  Coumadin 10 mg today again. Daily INR's, CBC.  Monitor for bleeding complications.  Davaris Youtsey S. Merilynn Finland, PharmD, BCPS Clinical Staff Pharmacist Pager [phone]  Gray Bernhardt.D 05/31/2012, 10:37 AM

## 2012-05-31 NOTE — Plan of Care (Signed)
Problem: RH BOWEL ELIMINATION Goal: RH STG MANAGE BOWEL W/MEDICATION W/ASSISTANCE STG Manage Bowel with Medication with mod Assistance.  Outcome: Progressing Senna HS ordered

## 2012-05-31 NOTE — Plan of Care (Signed)
Problem: RH PAIN MANAGEMENT Goal: RH STG PAIN MANAGED AT OR BELOW PT'S PAIN GOAL 3 or less  Outcome: Progressing No complaints of pain 2/22 and 2/23

## 2012-06-01 ENCOUNTER — Inpatient Hospital Stay (HOSPITAL_COMMUNITY): Payer: Medicaid Other | Admitting: Physical Therapy

## 2012-06-01 ENCOUNTER — Inpatient Hospital Stay (HOSPITAL_COMMUNITY): Payer: Medicaid Other

## 2012-06-01 ENCOUNTER — Encounter (HOSPITAL_COMMUNITY): Payer: Self-pay

## 2012-06-01 ENCOUNTER — Inpatient Hospital Stay (HOSPITAL_COMMUNITY): Payer: Medicaid Other | Admitting: Speech Pathology

## 2012-06-01 DIAGNOSIS — J9600 Acute respiratory failure, unspecified whether with hypoxia or hypercapnia: Secondary | ICD-10-CM

## 2012-06-01 DIAGNOSIS — R5381 Other malaise: Secondary | ICD-10-CM

## 2012-06-01 DIAGNOSIS — G9341 Metabolic encephalopathy: Secondary | ICD-10-CM

## 2012-06-01 LAB — CBC WITH DIFFERENTIAL/PLATELET
Basophils Absolute: 0 10*3/uL (ref 0.0–0.1)
Basophils Relative: 0 % (ref 0–1)
Hemoglobin: 11.3 g/dL — ABNORMAL LOW (ref 13.0–17.0)
Lymphocytes Relative: 13 % (ref 12–46)
MCHC: 31.3 g/dL (ref 30.0–36.0)
Neutro Abs: 9 10*3/uL — ABNORMAL HIGH (ref 1.7–7.7)
Neutrophils Relative %: 72 % (ref 43–77)
RDW: 15.6 % — ABNORMAL HIGH (ref 11.5–15.5)
WBC: 12.4 10*3/uL — ABNORMAL HIGH (ref 4.0–10.5)

## 2012-06-01 LAB — COMPREHENSIVE METABOLIC PANEL
ALT: 27 U/L (ref 0–53)
AST: 17 U/L (ref 0–37)
Albumin: 2.1 g/dL — ABNORMAL LOW (ref 3.5–5.2)
Alkaline Phosphatase: 99 U/L (ref 39–117)
Chloride: 96 mEq/L (ref 96–112)
Potassium: 3.7 mEq/L (ref 3.5–5.1)
Total Bilirubin: 0.3 mg/dL (ref 0.3–1.2)

## 2012-06-01 MED ORDER — QUETIAPINE FUMARATE 50 MG PO TABS
50.0000 mg | ORAL_TABLET | Freq: Every day | ORAL | Status: DC
  Administered 2012-06-01 – 2012-06-03 (×3): 50 mg via ORAL
  Filled 2012-06-01 (×4): qty 1

## 2012-06-01 MED ORDER — WARFARIN SODIUM 10 MG PO TABS
10.0000 mg | ORAL_TABLET | Freq: Once | ORAL | Status: AC
  Administered 2012-06-01: 10 mg via ORAL
  Filled 2012-06-01: qty 1

## 2012-06-01 MED ORDER — QUETIAPINE FUMARATE 100 MG PO TABS
100.0000 mg | ORAL_TABLET | Freq: Every day | ORAL | Status: DC
  Administered 2012-06-02: 100 mg
  Filled 2012-06-01 (×2): qty 1

## 2012-06-01 NOTE — Progress Notes (Signed)
Patient information reviewed and entered into eRehab system by Pietrina Jagodzinski, RN, CRRN, PPS Coordinator.  Information including medical coding and functional independence measure will be reviewed and updated through discharge.    

## 2012-06-01 NOTE — Progress Notes (Signed)
Subjective/Complaints: Did well last night. Awoke once around 3am but otherwise slept. A 12 point review of systems has been performed and if not noted above is otherwise negative.   Objective: Vital Signs: Blood pressure 92/52, pulse 76, temperature 98.4 F (36.9 C), temperature source Oral, resp. rate 18, weight 81.4 kg (179 lb 7.3 oz), SpO2 89.00%. No results found.  Recent Labs  06/01/12 0700  WBC 12.4*  HGB 11.3*  HCT 36.1*  PLT 223   No results found for this basename: NA, K, CL, CO, GLUCOSE, BUN, CREATININE, CALCIUM,  in the last 72 hours CBG (last 3)  No results found for this basename: GLUCAP,  in the last 72 hours  Wt Readings from Last 3 Encounters:  06/01/12 81.4 kg (179 lb 7.3 oz)  05/29/12 78.7 kg (173 lb 8 oz)  05/29/12 78.7 kg (173 lb 8 oz)    Physical Exam:  HENT: oral mucosa pink, dry especially tongue Head: Normocephalic and atraumatic.  Left Ear: External ear normal.  Eyes: Conjunctivae normal and EOM are normal. Pupils are equal, round, and reactive to light.  Neck: Normal range of motion. Neck supple.  Tracheostomy tube in place, #4 cuffless.. No drainage. No cap. Able to speak around trach Cardiovascular:  No murmur heard. Cardiac rate controlled at present. No murmur or rub  Pulmonary/Chest: No respiratory distress.  Decreased breath sounds to bases but clear to auscultation and a lot of upper airway sounds  Abdominal: Soft.  Abdomen is soft with positive bowel sounds. PEG tube in place  Neurological: He is alert.  Patient   able to talk around trach in a whisper. He remains easily distracted. He made good eye contact with examiner. He followed one and two-step demonstrated commands however inconsistent. UE 2+ to 3 proxm with deltoid, biceps, triceps to 4- distally. LE are 2+ to 3HF and KE, 4 with ADF and APF. No gross sensory deficits.  Skin: Skin is warm and dry.  Numerous ecchymoses and bruises on arms, legs   Assessment/Plan: 1. Functional  deficits secondary to deconditioning after vent dependent respiratory failure and multiple medical issues with associated encephalopathy which require 3+ hours per day of interdisciplinary therapy in a comprehensive inpatient rehab setting. Physiatrist is providing close team supervision and 24 hour management of active medical problems listed below. Physiatrist and rehab team continue to assess barriers to discharge/monitor patient progress toward functional and medical goals. FIM: FIM - Bathing Bathing: 1: Total-Patient completes 0-2 of 10 parts or less than 25%  FIM - Upper Body Dressing/Undressing Upper body dressing/undressing: 0: Wears gown/pajamas-no public clothing FIM - Lower Body Dressing/Undressing Lower body dressing/undressing: 0: Wears gown/pajamas-no public clothing  FIM - Toileting Toileting steps completed by patient: Adjust clothing prior to toileting;Performs perineal hygiene;Adjust clothing after toileting Toileting: 4: Steadying assist  FIM - Diplomatic Services operational officer Devices: Bedside commode Toilet Transfers: 4-To toilet/BSC: Min A (steadying Pt. > 75%);4-From toilet/BSC: Min A (steadying Pt. > 75%)  FIM - Bed/Chair Transfer Bed/Chair Transfer: 4: Chair or W/C > Bed: Min A (steadying Pt. > 75%)  FIM - Locomotion: Ambulation Locomotion: Ambulation Assistive Devices: Walker - Rolling Ambulation/Gait Assistance: 4: Min assist  Comprehension Comprehension Mode: Auditory Comprehension: 5-Follows basic conversation/direction: With extra time/assistive device  Expression Expression Mode: Verbal Expression Assistive Devices: 6-Talk trach valve Expression: 3-Expresses basic 50 - 74% of the time/requires cueing 25 - 50% of the time. Needs to repeat parts of sentences.  Social Interaction Social Interaction: 2-Interacts appropriately 25 - 49%  of time - Needs frequent redirection.  Problem Solving Problem Solving: 4-Solves basic 75 - 89% of the  time/requires cueing 10 - 24% of the time  Memory Memory: 5-Recognizes or recalls 90% of the time/requires cueing < 10% of the time  Medical Problem List and Plan:  1. Severe deconditioning encephalopathy after CHF, [name].  2. DVT Prophylaxis/Anticoagulation: Right lower extremity DVT/pulmonary emboli. IVC filter 05/04/2012 as well as Coumadin therapy.  3. Mood/ delirium. Seroquel 100 mg twice a day, Klonopin 1 mg twice a day.   sleep chart.  -wean meds this week, beginning with day time seroquel -out of restraints 4. Neuropsych: This patient is not capable of making decisions on his/her own behalf.  5. Tracheostomy/pulmonary. Status post 04/29/2012. Currently #4 cuffless trach in place. Speech therapy followup for PMV.   -recheck cxr today.  -continue PMV trials.  -not ready for decannulation yet 6. Dysphagia. Status post gastrostomy PEG tube 05/19/2012. Continue nutritional support   -increased H2O flushes to 250 cc yesterday 7. COPD/tobacco abuse. Continue nebs as directed  8. New onset atrial fib/RVR. Continue Lopressor as directed-rate controlled currently  9. Recurrent ileus. Resolved and monitored  LOS (Days) 3 A FACE TO FACE EVALUATION WAS PERFORMED  Cody [name] T 06/01/2012 8:02 AM

## 2012-06-01 NOTE — Progress Notes (Signed)
At 2220 O2 sats between 84-88% with 40% TC. Congested cough, able to cough up thick tan colored secretions. Ties secure, but trach appeared slightly dislodged. Paged respiratory-they used obturator to reinsert. O2 sats up to 89%, deep suctioned at that time. Blood tinged sputum noted. o2 sats at 91%. Trach care prrovided. Patient always laying on left side, regardless of how staff position him. Slept +/-. Requiring assistance with urinal at times. Urine amber. Patient dribbled urine in bed, but declined to have linen or boxers changed-"no, I'll be all right". Yeast like rash to bilateral groin and buttocks, microgaurd powder applied.  Agreed to mouth care only 2 times during the night. Jevity infusing at 70cc/hr with water flushes. Residual checked at 2230 and 0200=<10cc's. No agitation noted, cooperative, following commands, with occasional smile noted. Able to whisper around trach to make needs known, but has to repeat words. bedalarm in place to alert staff of unsafe behavior and as a reminder to patient not to get up without assistance.Cody [name] A

## 2012-06-01 NOTE — Plan of Care (Signed)
Problem: RH SKIN INTEGRITY Goal: RH STG ABLE TO PERFORM INCISION/WOUND CARE W/ASSISTANCE STG Able To Perform Incision/Wound Care With max Assistance.  Outcome: Not Progressing Patient requires total assist with wound care at this time .

## 2012-06-01 NOTE — Progress Notes (Addendum)
Physical Therapy Session Note  Patient Details  Name: Cody [name] MRN: [id number] Date of Birth: Nov 20, 1950  Today's Date: 06/01/2012 Time: 0730-0830 Time Calculation (min): 60 min  Short Term Goals: Week 1:  PT Short Term Goal 1 (Week 1): Patient will be able to perform bed mobility with Supervised/Mod-I assist. PT Short Term Goal 2 (Week 1): Patient will be able to perform transfers with Supervised/min-Assist PT Short Term Goal 3 (Week 1): Patient will be able to ambulate 63' using LRAD with Supervised/min-Assist while maintaining O2 sats > 90%. PT Short Term Goal 4 (Week 1): Patient will be able to ascend/descend 4 steps with handrails with Supervised/min-Assist  Skilled Therapeutic Interventions/Progress Updates:    Session focused on out of bed mobility and endurance. Sit <> stands x 5 and standing activities edge of bed and pt only willing to stay standing ~30 sec secondary to fatigue, encouraged to stand longer. Performed stand pivot bed > wheelchair > bedside commode all with min assist/close supervision and RW. Ambulation x 10', 15', 22' with RW, min assist cues for improved posture and controlled breathing. Pt impulsive and has decreased safety awareness throughout. Unable to make it to the gym to try stairs this session.   SpO2>90% on trach collar, HR 80s-90s bpm.   Therapy Documentation Precautions:  Precautions Precautions: Fall Precaution Comments:  (Trach and PEG tube) Restrictions Weight Bearing Restrictions: No Pain:  6/10 Lt. Flank pain, RN made aware.   See FIM for current functional status  Therapy/Group: Individual Therapy  [name] 06/01/2012, 12:30 PM

## 2012-06-01 NOTE — Progress Notes (Signed)
ANTICOAGULATION CONSULT NOTE - Follow Up Consult  Pharmacy Consult for coumadin Indication: DVT  Allergies  Allergen Reactions  . Protamine Anaphylaxis    Patient Measurements: Weight: 179 lb 7.3 oz (81.4 kg) Heparin Dosing Weight:   Vital Signs: Temp: 98.4 F (36.9 C) (02/24 0443) Temp src: Oral (02/24 0443) BP: 92/52 mmHg (02/24 0443) Pulse Rate: 76 (02/24 0519)  Labs:  Recent Labs  05/30/12 0730 05/31/12 0655 06/01/12 0700  HGB  --   --  11.3*  HCT  --   --  36.1*  PLT  --   --  223  LABPROT 18.2* 20.4* 22.0*  INR 1.56* 1.82* 2.01*  CREATININE  --   --  0.62    The CrCl is unknown because both a height and weight (above a minimum accepted value) are required for this calculation.   Medications:  Scheduled:  . antiseptic oral rinse  15 mL Mouth Rinse q12n4p  . chlorhexidine  15 mL Mouth Rinse BID  . clonazePAM  1 mg Per Tube BID  . feeding supplement  30 mL Per Tube TID  . free water  250 mL Per Tube Q6H  . levalbuterol  0.63 mg Nebulization Q6H  . metoprolol tartrate  25 mg Oral BID  . [START ON 06/02/2012] QUEtiapine  100 mg Per Tube QHS  . QUEtiapine  50 mg Oral Daily  . senna-docusate  2 tablet Oral QHS  . [COMPLETED] warfarin  10 mg Oral ONCE-1800  . Warfarin - Pharmacist Dosing Inpatient   Does not apply q1800  . [DISCONTINUED] QUEtiapine  100 mg Per Tube BID   Infusions:  . feeding supplement (JEVITY 1.2 CAL) 1,000 mL (05/30/12 1605)    Assessment: 62 yo male with new PE/DVT is currently on therapeutic coumadin.  INR today is 2.01 from 1.82.  Goal of Therapy:  INR 2-3    Plan:  1) Coumadin 10mg  po x1 2) INR in am.  Brok Stocking, Tsz-Yin 06/01/2012,8:15 AM

## 2012-06-01 NOTE — Progress Notes (Signed)
Physical Therapy Session Note  Patient Details  Name: Cody [name] MRN: [id number] Date of Birth: Jun 25, 1950  Today's Date: 06/01/2012 Time: 4098-1191 Time Calculation (min): 42 min  Short Term Goals: Week 1:  PT Short Term Goal 1 (Week 1): Patient will be able to perform bed mobility with Supervised/Mod-I assist. PT Short Term Goal 2 (Week 1): Patient will be able to perform transfers with Supervised/min-Assist PT Short Term Goal 3 (Week 1): Patient will be able to ambulate 22' using LRAD with Supervised/min-Assist while maintaining O2 sats > 90%. PT Short Term Goal 4 (Week 1): Patient will be able to ascend/descend 4 steps with handrails with Supervised/min-Assist  Skilled Therapeutic Interventions/Progress Updates:    Pt with urinary incontinence upon arrival (unclear if he was unaware or did not care), stand pivot to wheelchair and standing change of gown and boxers with min assist. Pt needed 3 sit <> stands with min-guard assist to complete task secondary to decreased standing activity tolerance. Wheelchair mobility for management/negotiation as well as endurance x 50' with 2 rest breaks. Verbal and tactile cues for parts management and steering.  Stairs performed (2 steps) up, seated rest needed at top of steps and then 2 steps down with moderate assist secondary to weakness. Pt maintains flexed trunk in standing. Pt quickly fatigued by all activity. Stand pivot without device back to bed with min assist.   SpO2 down to 86% at lowest on 10L FiO2 40% trach collar after steps and with return to bed. Slow to return to 91% with rest and cues for diaphragmatic breathing. RN aware.  Therapy Documentation Precautions:  Precautions Precautions: Fall Precaution Comments:  (Trach and PEG tube) Restrictions Weight Bearing Restrictions: No Pain: Pain Assessment Pain Score:   5 Pain Type: Acute pain Pain Location: Abdomen Pain Orientation: Left Pain Descriptors: Aching Pain Onset:  On-going Pain Intervention(s): RN made aware  See FIM for current functional status  Therapy/Group: Individual Therapy  [name] 06/01/2012, 3:33 PM

## 2012-06-01 NOTE — Progress Notes (Signed)
Speech Language Pathology Daily Session Note  Patient Details  Name: Cody [name] MRN: [id number] Date of Birth: 06-12-1950  Today's Date: 06/01/2012 Time: 4098-1191 Time Calculation (min): 40 min  Short Term Goals: Week 1: SLP Short Term Goal 1 (Week 1): Pt will perform pharyngeal strengthening exercises with Mod A verbal and visual cues.  SLP Short Term Goal 2 (Week 1): Pt will consume trials of puree textures with Mod A verbal cues with minimal overt s/s of aspiration SLP Short Term Goal 3 (Week 1): Pt will consume trials of thin liquids with minimal overt s/s of aspiration with Mod A verbal cues.  SLP Short Term Goal 4 (Week 1): Pt will demonstrate increased vocal intensity at the phrase level with PMSV in place with Mod A verbal and visual cues.  SLP Short Term Goal 5 (Week 1): Pt will demonstrate orientaion to person, place, time and situation with Mod A semantic and visual cues.  SLP Short Term Goal 6 (Week 1): Pt will sustain attention to a functional task for 5 minutes with Mod A verbal cues for redirection   Skilled Therapeutic Interventions: Treatment session focused on speech, dysphagia and cognitive goals. PMSV donned and pt tolerated for 40 minutes with vital signs stable (O2: 91% and HR:90). Pt demonstrated decreased breath support and demonstrated decreased vocal intensity for 1-2 word utterances. Pt consumed trials of ice chips without overt s/s of aspiration but demonstrated decreased hyo-laryngeal elevation and excursion.  Pt was oriented to place but required Mod A question cues for orientation to time and situation. Pt also demonstrated decreased sustained attention to task ~2 minutes and was perseverative on back pain and getting back into bed. RN made aware and medication received. Pt demonstrated appropriate safety awareness and waited for assistance from clinician before transfer back to bed.    FIM:  Comprehension Comprehension Mode: Auditory Comprehension:  3-Understands basic 50 - 74% of the time/requires cueing 25 - 50%  of the time Expression Expression Mode: Verbal Expression: 2-Expresses basic 25 - 49% of the time/requires cueing 50 - 75% of the time. Uses single words/gestures. Social Interaction Social Interaction: 2-Interacts appropriately 25 - 49% of time - Needs frequent redirection. Problem Solving Problem Solving: 3-Solves basic 50 - 74% of the time/requires cueing 25 - 49% of the time Memory Memory: 2-Recognizes or recalls 25 - 49% of the time/requires cueing 51 - 75% of the time  Pain Pain Assessment Pain Score:   4 Pain Type: Back pain Pain Location: Abdomen Pain Orientation: Left Pain Descriptors: Aching Pain Onset: On-going Pain Intervention(s): RN made aware and medications given   Therapy/Group: Individual Therapy  Alexyss Balzarini 06/01/2012, 4:34 PM

## 2012-06-01 NOTE — Progress Notes (Signed)
Patient tolerating therapy . No attempts out of bed or pulling at tubes . Side rails up x 3 bed alarm in place . o2 sat between 88-97% throughout day . Patient on 40% trach collar. Continue with plan of care .       Cody [name]

## 2012-06-01 NOTE — Progress Notes (Signed)
[facility] Individual Statement of Services  Patient Name:  Cody [name]  Date:  06/01/2012  Welcome to the [facility].  Our goal is to provide you with an individualized program based on your diagnosis and situation, designed to meet your specific needs.  With this comprehensive rehabilitation program, you will be expected to participate in at least 3 hours of rehabilitation therapies Monday-Friday, with modified therapy programming on the weekends.  Your rehabilitation program will include the following services:  Physical Therapy (PT), Occupational Therapy (OT), Speech Therapy (ST), 24 hour per day rehabilitation nursing, Therapeutic Recreaction (TR), Neuropsychology, Case Management ( Social Worker), Rehabilitation Medicine, Nutrition Services and Pharmacy Services  Weekly team conferences will be held on Tuesdays to discuss your progress.  Your  Social Worker will talk with you frequently to get your input and to update you on team discussions.  Team conferences with you and your family in attendance may also be held.  Expected length of stay: 2 weeks  Overall anticipated outcome: supervision  Depending on your progress and recovery, your program may change.  Your  Social Worker will coordinate services and will keep you informed of any changes.  Your  Social Worker's name and contact numbers are listed  below.  The following services may also be recommended but are not provided by the [facility]:   Driving Evaluations  Home Health Rehabiltiation Services  Outpatient Rehabilitatation [facility]  Vocational Rehabilitation   Arrangements will be made to provide these services after discharge if needed.  Arrangements include referral to agencies that provide these services.  Your insurance has been verified to be:  Medicaid application pending Your primary doctor is:  Dr. Birdie Sons  Pertinent information will be shared  with your doctor and your insurance company.   Social Worker:  [place], [place] [phone] or ([phone]  Information discussed with and copy given to patient by: Cody [name], 06/01/2012, 1:26 PM

## 2012-06-01 NOTE — Progress Notes (Signed)
Occupational Therapy Note  Patient Details  Name: Cody [name] MRN: [id number] Date of Birth: 1950/06/20 Today's Date: 06/01/2012  Time: 10:30am-11:05am ( .) Missed 25 minutes secondary to pt fatigue and refusal to participate.   Pt see for 1:1 OT session this morning with focus on transfers, bed mobility during ADL's. Pt asleep in bed upon arrival, extremely difficult to wake up. When asked if pt had pain, he pointed to PEG tube and agreed it was uncomfortable (no number given for pain.) Many attempts to get pt to sit at EOB, with pt finally sitting up for about 4-5 minutes. Pt did not want to get washed up, encouragement to wash UE's. Pt also did not want to change boxers or hospital gown and returned to supine. While in supine, pt difficult to keep awake. Therapist attempted HOH to wash pt's chest, abdomen, hands and upper legs, with pt not very receptive. Pt attempted to comb hair, requiring HOH to initiate. Oxygen sats monitored throughout which were in [facility]. Ended session with pt supine with all bed rails up, call bell in place.     Debbie Bellucci Hessie Diener 06/01/2012, 11:14 AM

## 2012-06-01 NOTE — Progress Notes (Signed)
Social Work  Social Work Assessment and Plan  Patient Details  Name: Cody [name] MRN: [id number] Date of Birth: 1950-08-13  Today's Date: 06/01/2012  Problem List:  Patient Active Problem List  Diagnosis  . HYPERLIPIDEMIA  . TOBACCO USE  . HYPERTENSION  . GERD  . CEREBROVASCULAR ACCIDENT, HX OF  . COPD (chronic obstructive pulmonary disease)  . Acute encephalopathy  . Acute kidney injury  . Acute respiratory failure with hypercapnia  . Atrial fibrillation  . PE (pulmonary embolism)  . Tracheostomy status  . Physical deconditioning   Past Medical History:  Past Medical History  Diagnosis Date  . Stroke   . Hypertension   . Hyperlipidemia   . GERD (gastroesophageal reflux disease)   . COPD (chronic obstructive pulmonary disease)    Past Surgical History:  Past Surgical History  Procedure Laterality Date  . Gastrostomy N/A 05/19/2012    Procedure: PEG Possible Open Gastrostomy;  Surgeon: Kandis Cocking, MD;  Location: [facility] OR;  Service: General;  Laterality: N/A;   Social History:  reports that he has been smoking Cigarettes.  He has been smoking about 0.50 packs per day. He has never used smokeless tobacco. He reports that he does not drink alcohol or use illicit drugs.  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 Other Supports: pt has custody of 3 teen-aged grandaughters [phone]) who are all living at home with them:  [place] and [place].  Fiance notes "... and they have been great...a god-send...".  Fiance also notes that her church has been extremely supportive and even assisting with some  financial needs. Anticipated Caregiver: Karin Lieu (Pt's partner) and 3 teenaged granddaughters. Ability/Limitations of Caregiver: Fiance recently secured a part-time jobs at [employer], however, hours vary considerably.   Grandtrs are in school until 4 pm Mon-Fri. Caregiver Availability: Other (Comment) (Pt's partner just got a job with variable shifts ) Family Dynamics: Fiance reports that she and patient have a very stable, supportive relationship.  Very good relationship between pt and his son and grandtrs as well.  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  Attempted to speak with him briefly (he speaks around trach), however, pt easily "winded" and cannot complete full interview.  Fiance provides needed information.  Cannot truly assess pt's current emotional status due to decreased alertness and stamina.  Fiance notes he has been physcially agitate but that this does appear to be decreasing somewhat since transfer to CIR. Recent Psychosocial Issues: Fiance notes no significant social issues PTA, however, pt's health has been declining for approx 3-4 months PTA and he had been reluctant  to seek out medical help. (due to finances) Pyschiatric History: none Substance Abuse History: none  Patient / Family Perceptions, Expectations &  Goals Pt/Family understanding of illness & functional limitations: Fiance with basic understanding of pt's resp failure and hospital course as well as purpose of CIR Premorbid pt/family roles/activities: Pt was primary wage earner and working full-time (had been decreasing work somewhat due to declining health).  Fiance and grandtrs managing home. Anticipated changes in roles/activities/participation: Fiance has now secured a p/t job and is primary financial supporter of home.  Grandaughters are assisting with upkeep of home.  All four to assume shared caregiver roles upon pt's d/c. Pt/family expectations/goals: Fiance hopeful he might be able to be left alone for a few hours any given day (depends on her schedule)  [employer]: None Premorbid Home Care/DME Agencies: None Transportation available at discharge: yes Resource referrals recommended: Neuropsychology  Discharge Planning Living Arrangements: Spouse/significant other;Children Support Systems: Spouse/significant other;Children;Other relatives;Church/faith community Type of Residence: Private residence Insurance Resources: Self-pay (Medicaid application completed) Financial Resources: Employment;Family Support Financial Screen Referred: Previously completed Living Expenses: Own Money Management: Patient Do you have any problems obtaining your medications?: Yes (Describe) (Medicaid pending) Home Management: fiance and grandtrs. Patient/Family Preliminary Plans: Fiance hopeful pt can return home with intermittent assist of family, however, SNF is a possible option Barriers to Discharge: Family Support Social Work Anticipated Follow Up Needs: HH/OP;SNF Expected length of stay: 2-3 weeks  Clinical Impression Very deconditioned gentleman lying in bed and unable to participate in full interview.  Fiance at bedside and very supportive in addition they have three teen-aged grandtrs and pt's son who can also  assist intermittently.  Family hopeful he will make good progress/ gains while here and be able to avoid having to consider SNF.  Cannot assess emotional status of pt at this point.  Will monitor.  Jobin Montelongo 06/01/2012, 2:07 PM

## 2012-06-02 ENCOUNTER — Inpatient Hospital Stay (HOSPITAL_COMMUNITY): Payer: Medicaid Other | Admitting: Physical Therapy

## 2012-06-02 ENCOUNTER — Inpatient Hospital Stay (HOSPITAL_COMMUNITY): Payer: Self-pay | Admitting: Speech Pathology

## 2012-06-02 ENCOUNTER — Inpatient Hospital Stay (HOSPITAL_COMMUNITY): Payer: Medicaid Other

## 2012-06-02 MED ORDER — ACETAMINOPHEN 325 MG PO TABS
650.0000 mg | ORAL_TABLET | Freq: Once | ORAL | Status: AC
  Administered 2012-06-02: 650 mg via ORAL
  Filled 2012-06-02: qty 2

## 2012-06-02 MED ORDER — DM-GUAIFENESIN ER 30-600 MG PO TB12
1.0000 | ORAL_TABLET | Freq: Two times a day (BID) | ORAL | Status: DC
  Filled 2012-06-02 (×3): qty 1

## 2012-06-02 MED ORDER — WARFARIN SODIUM 10 MG PO TABS
10.0000 mg | ORAL_TABLET | Freq: Once | ORAL | Status: AC
  Administered 2012-06-02: 10 mg via ORAL
  Filled 2012-06-02: qty 1

## 2012-06-02 MED ORDER — GUAIFENESIN 100 MG/5ML PO SOLN
200.0000 mg | Freq: Four times a day (QID) | ORAL | Status: DC
  Administered 2012-06-02 – 2012-06-03 (×5): 200 mg via ORAL
  Filled 2012-06-02: qty 118
  Filled 2012-06-02: qty 10
  Filled 2012-06-02 (×3): qty 118
  Filled 2012-06-02 (×2): qty 10
  Filled 2012-06-02: qty 118
  Filled 2012-06-02: qty 10

## 2012-06-02 MED ORDER — JEVITY 1.2 CAL PO LIQD
1000.0000 mL | ORAL | Status: DC
  Administered 2012-06-02: 1000 mL
  Filled 2012-06-02 (×4): qty 1000

## 2012-06-02 MED ORDER — PIPERACILLIN-TAZOBACTAM 3.375 G IVPB
3.3750 g | Freq: Three times a day (TID) | INTRAVENOUS | Status: DC
  Administered 2012-06-02 – 2012-06-03 (×4): 3.375 g via INTRAVENOUS
  Filled 2012-06-02 (×6): qty 50

## 2012-06-02 NOTE — Progress Notes (Signed)
NUTRITION FOLLOW UP  DOCUMENTATION CODES  Per approved criteria   -Severe malnutrition in the context of acute illness or injury   Intervention:   Continue current EN regimen of Jevity 1.2 at 70 ml/hr x 20 hours (off for therapies) with Prostat liquid protein 30 ml 3 times daily via tube to provide 1980 total kcals, 123 gm protein, 1130 ml of water  Continue free water flushes at 250 ml every 6 hours RD to follow for nutrition care plan  Nutrition Dx:   Unintended weight loss related to acute hospitalization & immobility as evidenced by 22% weight loss. Improving.  Goal:   EN to meet >/= 90% of estimated nutrition needs. Met.  Monitor:   EN regimen & tolerance, weight, labs, I/O's  Assessment:   [name], [name]; [name]  Continues to receive Jevity 1.2 at 70 ml/hr x 20 hours with Prostat liquid protein 30 ml 3 times daily via tube. This provides 1980 total kcals, 123 gm protein and 1130 ml of water. Receiving free water flushes of 250 ml QID - provides an additional 1 liter of fluid daily; total free water provision is 2130 ml. Sodium is currently WNL.  RN reports that pt is tolerating current enteral nutrition regimen well, noting that his residuals are often <5-10 ml. She does note that pt is having an increased amount of trach secretions at this time. Discussed option briefly with Angie, RN about transitioning to bolus feedings, however she recommended waiting until secretions improve; pt also noted to have temperature of 100.4 at this time.  Height: Ht Readings from Last 1 Encounters:  05/28/12 5\' 10"  (1.778 m)    Weight Status:   Wt Readings from Last 1 Encounters:  06/02/12 185 lb 13.6 oz (84.3 kg)  Admit wt: 179 lb  Re-estimated needs:  Kcal: 1950 - 2150 Protein: 120 - 130 grams Fluid: 1.9 - 2.1 liters  Skin: Stage I pressure ulcer to sacrum; abdominal  incision  Diet Order: NPO   Intake/Output Summary (Last 24 hours) at 06/02/12 1106 Last data filed at 06/02/12 0840  Gross per 24 hour  Intake      0 ml  Output   1526 ml  Net  -1526 ml    Last BM: 2/23   Labs:   Recent Labs Lab 05/28/12 0435 05/29/12 0510 06/01/12 0700  NA 146* 144 136  K 3.8 3.7 3.7  CL 103 103 96  CO2 34* 34* 34*  BUN 31* 28* 21  CREATININE 0.91 0.82 0.62  CALCIUM 9.7 9.6 9.1  GLUCOSE 152* 160* 122*    CBG (last 3)  No results found for this basename: GLUCAP,  in the last 72 hours  Scheduled Meds: . antiseptic oral rinse  15 mL Mouth Rinse q12n4p  . chlorhexidine  15 mL Mouth Rinse BID  . clonazePAM  1 mg Per Tube BID  . feeding supplement  30 mL Per Tube TID  . free water  250 mL Per Tube Q6H  . guaiFENesin  200 mg Oral Q6H  . levalbuterol  0.63 mg Nebulization Q6H  . metoprolol tartrate  25 mg Oral BID  . piperacillin-tazobactam (ZOSYN)  IV  3.375 g Intravenous Q8H  . QUEtiapine  100 mg Per Tube QHS  . QUEtiapine  50 mg Oral Daily  . senna-docusate  2 tablet Oral QHS  . warfarin  10 mg Oral ONCE-1800  . Warfarin -  Pharmacist Dosing Inpatient   Does not apply q1800    Continuous Infusions: . feeding supplement (JEVITY 1.2 CAL)      Cody Motto MS, RD, LDN Pager: [phone] After-hours pager: [phone]

## 2012-06-02 NOTE — Progress Notes (Signed)
ANTICOAGULATION CONSULT NOTE - Follow Up Consult  Pharmacy Consult for coumadin Indication: PE/DVT (s/p IVC filter)  Allergies  Allergen Reactions  . Protamine Anaphylaxis    Patient Measurements: Weight: 185 lb 13.6 oz (84.3 kg) Heparin Dosing Weight:   Vital Signs: Temp: 100.4 F (38 C) (02/25 0635) Temp src: Oral (02/25 0635) BP: 116/56 mmHg (02/25 0635) Pulse Rate: 88 (02/25 0635)  Labs:  Recent Labs  05/31/12 0655 06/01/12 0700 06/02/12 0532  HGB  --  11.3*  --   HCT  --  36.1*  --   PLT  --  223  --   LABPROT 20.4* 22.0* 22.9*  INR 1.82* 2.01* 2.13*  CREATININE  --  0.62  --     The CrCl is unknown because both a height and weight (above a minimum accepted value) are required for this calculation.   Medications:  Scheduled:  . [COMPLETED] acetaminophen  650 mg Oral Once  . antiseptic oral rinse  15 mL Mouth Rinse q12n4p  . chlorhexidine  15 mL Mouth Rinse BID  . clonazePAM  1 mg Per Tube BID  . feeding supplement  30 mL Per Tube TID  . free water  250 mL Per Tube Q6H  . guaiFENesin  200 mg Oral Q6H  . levalbuterol  0.63 mg Nebulization Q6H  . metoprolol tartrate  25 mg Oral BID  . piperacillin-tazobactam (ZOSYN)  IV  3.375 g Intravenous Q8H  . QUEtiapine  100 mg Per Tube QHS  . QUEtiapine  50 mg Oral Daily  . senna-docusate  2 tablet Oral QHS  . [COMPLETED] warfarin  10 mg Oral ONCE-1800  . Warfarin - Pharmacist Dosing Inpatient   Does not apply q1800  . [DISCONTINUED] dextromethorphan-guaiFENesin  1 tablet Oral BID   Infusions:  . feeding supplement (JEVITY 1.2 CAL) 1,000 mL (06/01/12 1742)    Assessment: 62 yo male with PE/DVT is currently on therapeutic coumadin.  INR today is 2.13 from 2.01.   Goal of Therapy:  INR 2-3    Plan:  1) Coumadin 10mg  po x1 again tonight. 2) INR in am.  Jhanvi Drakeford, Tsz-Yin 06/02/2012,8:23 AM

## 2012-06-02 NOTE — Progress Notes (Signed)
Physical Therapy Session Note  Patient Details  Name: Cody [name] MRN: [id number] Date of Birth: 12/08/1950  Today's Date: 06/02/2012 Time: 4098-1191 Time Calculation (min): 45 min  Short Term Goals: Week 1:  PT Short Term Goal 1 (Week 1): Patient will be able to perform bed mobility with Supervised/Mod-I assist. PT Short Term Goal 2 (Week 1): Patient will be able to perform transfers with Supervised/min-Assist PT Short Term Goal 3 (Week 1): Patient will be able to ambulate 38' using LRAD with Supervised/min-Assist while maintaining O2 sats > 90%. PT Short Term Goal 4 (Week 1): Patient will be able to ascend/descend 4 steps with handrails with Supervised/min-Assist  Skilled Therapeutic Interventions/Progress Updates:    Pt missed 15 min at start of treatment for breathing treatment and nursing care. RN reports pt more fatigued today. Pt willing to work with therapy however. Wheelchair mobility x 120', 70' for endurance and strengthening, 1 rest break each bout, decreased speed with fatigue. NuStep level 2 x 6 min with intermittent rest breaks, pt given cues for increased speed as tolerated. Stand pivot transfers x 3 performed with min assist and no device. Pt continues to need safety cues throughout treatment for wheelchair set up, parts management, and to improve safety awareness. Pt encouraged to stay seated up in wheelchair for 30 min to promote out of bed activity tolerance and improve lung function and health. Pt reluctantly agreed, he is motivated but very fatigued. Fiance present for session.     Therapy Documentation Precautions:  Precautions Precautions: Fall Precaution Comments:  (Trach and PEG tube) Restrictions Weight Bearing Restrictions: No General: Amount of Missed PT Time (min): 15 Minutes Missed Time Reason: Nursing care Vital Signs: Therapy Vitals Pulse Rate: up to 105 bpm with activity Oxygen Therapy SpO2: 86-95% (lowest during transfers and activity) O2  Device: Trach collar O2 Flow Rate (L/min): 10 L/min FiO2 (%): 40 %  Pain: Pain Assessment Pain Assessment: No/denies pain  See FIM for current functional status  Therapy/Group: Individual Therapy  [name] 06/02/2012, 11:58 AM

## 2012-06-02 NOTE — Progress Notes (Signed)
SLP Cancellation Note  Patient Details Name: Cody [name] MRN: [id number] DOB: Jul 10, 1950   Cancelled treatment:   Pt missed 30 minutes of skilled SLP intervention due to fatigue and not feeling well.    Shakur Lembo 06/02/2012, 2:52 PM

## 2012-06-02 NOTE — Progress Notes (Signed)
Physical Therapy Session Note  Patient Details  Name: Cody [name] MRN: [id number] Date of Birth: 10-12-1950  Today's Date: 06/02/2012  PT Cancellation Note  Treatment cancelled today due to patient's decline to participate secondary to fatigue, pt barely able to open eyes. RN made aware. Pt missed 45 min skilled PT.   Sherrine Maples Cheek 06/02/2012, 2:01 PM

## 2012-06-02 NOTE — Progress Notes (Signed)
Occupational Therapy Note  Patient Details  Name: Cody [name] MRN: [id number] Date of Birth: July 30, 1950 Today's Date: 06/02/2012  Time: 11am-11:50am (50 min.)  Pt seen for 1:1 OT session with COTA to complete bathing and dressing. Pt sitting in wheelchair upon arrival, wanting to return to bed. With encouragement, pt completed hygiene activities at sink level. Verbal cueing throughout session for task initiation and thoroughness. Pt refused to get dressed fully and did not want to wear TED hose, which was reported to nursing. Pt did agree to donn a fresh shirt and boxers after bathing. Pt completed 4-5 sit <> stands during session with CGA for safety. Pt used comb to wet hair before donning a no rinse shower cap and combed hair afterwards. After hygiene complete, pt requested to get back into bed. CGA for transfer to bed and min A for bed mobility once supine. Pt reports a pain rating (using fingers) of 8/10 near PEG tube insertion, mostly when coughing.    Audrey Thull Hessie Diener 06/02/2012, 12:19 PM

## 2012-06-02 NOTE — Consult Note (Signed)
PULMONARY  / CRITICAL CARE MEDICINE  Name: Cody [name] MRN: [id number] DOB: [birth year]    ADMISSION DATE:  05/29/2012 CONSULTATION DATE:  2/25   REFERRING MD :  Dr. Riley Kill  CHIEF COMPLAINT:  Desaturations, LLL atx  BRIEF PATIENT DESCRIPTION: 62 y/o M with PMH of COPD, GERD, HLD, HTN, CVA admitted 1/5-2/21 with VDRF secondary to influenza A and aspiration PNA.  Required trach 1/22.  Hospital course complicated by new PE, Bilateral DVT s/p IVC filter 1/27.  2/24 noted to have worsened LLL atx on cxr.  2/25 mild temp (100.4) and worsened cxr.  PCCM consulted for concern of LLL PNA.    SIGNIFICANT EVENTS / STUDIES:  1/5-2/21 - Admit to [facility] for VDRF 2/2 flu, asp PNA s/p trach   CULTURES: 2/25 Sputum>>>   ANTIBIOTICS: Zosyn 2/25>>>  HISTORY OF PRESENT ILLNESS:  62 y/o M with PMH of COPD, GERD, HLD, HTN, CVA admitted 1/5-2/21 with VDRF secondary to influenza A and aspiration PNA.  Required trach 1/22.  Hospital course complicated by new PE, Bilateral DVT s/p IVC filter 1/27.  Transferred to [facility] inpatient Rehab.  Had been progressing well but continued generalized weakness.  2/24 noted to have worsened LLL atx on cxr.  2/25 mild temp (100.4) and worsened cxr.      PAST MEDICAL HISTORY :  Past Medical History  Diagnosis Date  . Stroke   . Hypertension   . Hyperlipidemia   . GERD (gastroesophageal reflux disease)   . COPD (chronic obstructive pulmonary disease)    Past Surgical History  Procedure Laterality Date  . Gastrostomy N/A 05/19/2012    Procedure: PEG Possible Open Gastrostomy;  Surgeon: Kandis Cocking, MD;  Location: [facility] OR;  Service: General;  Laterality: N/A;   Prior to Admission medications   Medication Sig Start Date End Date Taking? Authorizing Provider  amLODipine (NORVASC) 5 MG tablet Take 1 tablet (5 mg total) by mouth daily. 02/21/12  Yes Lindley Magnus, MD  aspirin 81 MG tablet Take 81 mg by mouth daily.     Yes Historical Provider, MD   budesonide-formoterol (SYMBICORT) 160-4.5 MCG/ACT inhaler Inhale 2 puffs into the lungs 2 (two) times daily. 06/18/11 06/17/12 Yes Bruce Romilda Garret, MD  hydrochlorothiazide (HYDRODIURIL) 25 MG tablet Take 1 tablet (25 mg total) by mouth daily. 02/28/12 02/27/13 Yes Terressa Koyanagi, DO  Ibuprofen-Diphenhydramine HCl (ADVIL PM) 200-25 MG CAPS Take 1 capsule by mouth at bedtime as needed. For sleep   Yes Historical Provider, MD  lisinopril (PRINIVIL,ZESTRIL) 40 MG tablet Take 1 tablet (40 mg total) by mouth daily. 09/09/11 09/08/12 Yes Lindley Magnus, MD   Allergies  Allergen Reactions  . Protamine Anaphylaxis    FAMILY HISTORY:  Family History  Problem Relation Age of Onset  . COPD Mother    SOCIAL HISTORY:  reports that he has been smoking Cigarettes.  He has been smoking about 0.50 packs per day. He has never used smokeless tobacco. He reports that he does not drink alcohol or use illicit drugs.  REVIEW OF SYSTEMS:   Constitutional: Negative for weight loss.  Reports fevers, chills,  diaphoresis.  HENT: Negative for hearing loss, ear pain, nosebleeds, congestion, sore throat, neck pain, tinnitus and ear discharge.   Eyes: Negative for blurred vision, double vision, photophobia, pain, discharge and redness.  Respiratory: Negative for hemoptysis, shortness of breath, wheezing and stridor.  Reports cough with sputum production, predominantly in am, white to creamy in color Cardiovascular: Negative for chest pain,  palpitations, orthopnea, claudication, leg swelling and PND.  Gastrointestinal: Negative for heartburn, nausea, vomiting, diarrhea, constipation, blood in stool and melena. Reports abd pain, worse with cough.   Genitourinary: Negative for dysuria, urgency, frequency, hematuria and flank pain.  Musculoskeletal: Negative for myalgias, back pain, joint pain and falls.  Skin: Negative for itching and rash.  Neurological: Negative for dizziness, tingling, tremors, sensory change, speech change,  focal weakness, seizures, loss of consciousness, weakness and headaches.  Endo/Heme/Allergies: Negative for environmental allergies and polydipsia. Does not bruise/bleed easily.  SUBJECTIVE:  Patient reports he began feeling poorly 48 hours ago, has progressed to fevers, chills, cough, early day sputum production.  Also endorses abdominal pain on left side.    VITAL SIGNS: Temp:  [97.7 F (36.5 C)-100.4 F (38 C)] 100.4 F (38 C) (02/25 0635) Pulse Rate:  [77-88] 77 (02/25 0934) Resp:  [18-19] 19 (02/25 0934) BP: (85-125)/(53-64) 125/55 mmHg (02/25 0925) SpO2:  [86 %-95 %] 92 % (02/25 0934) FiO2 (%):  [40 %] 40 % (02/25 0934) Weight:  [185 lb 13.6 oz (84.3 kg)] 185 lb 13.6 oz (84.3 kg) (02/25 0635)  PHYSICAL EXAMINATION: General:  Chronically ill in NAD Neuro:  AAOx4, MAE, generalized weakness HEENT:  #4 trach midline, c/d/i Cardiovascular:  s1s2 rrr, no m/r/g Lungs:  resp's shallow, non-labored, lungs bilaterally diminished Abdomen:  Round/soft, bsx4 active, PEG c/d/i Musculoskeletal:  No acute deformities Skin:  Warm/dry, trach LE edema   Recent Labs Lab 05/28/12 0435 05/29/12 0510 06/01/12 0700  NA 146* 144 136  K 3.8 3.7 3.7  CL 103 103 96  CO2 34* 34* 34*  BUN 31* 28* 21  CREATININE 0.91 0.82 0.62  GLUCOSE 152* 160* 122*    Recent Labs Lab 05/28/12 0435 06/01/12 0700  HGB 13.2 11.3*  HCT 42.5 36.1*  WBC 14.5* 12.4*  PLT 222 223   Dg Chest 2 View  06/01/2012  *RADIOLOGY REPORT*  Clinical Data: Respiratory distress.  Tracheostomy.  CHEST - 2 VIEW  Comparison: 05/26/2012.  Findings: Tracheostomy appears unchanged compared to prior. Increasing opacity at the left lung base compatible with airspace disease and atelectasis.  Right lung base appears little changed allowing for technique.  The cardiopericardial silhouette and mediastinal contours are unchanged.  IVC filter incidentally noted on the lateral view.  IMPRESSION: Worsening airspace disease and atelectasis  at the left lung base. Unchanged tracheostomy.   Original Report Authenticated By: Andreas Newport, M.D.     ASSESSMENT / PLAN:  Acute on Chronic Respiratory Failure s/p Trach LLL Airspace Disease - atx vs infiltrate- this is concerning HCAP Fever  62 y/o M with complex medical hx, recent prolonged admit with profound critical illness,. Transferred to [facility] CIR and had been progressing well.  2/24 began feeling poorly.  2/25 with fevers, chills, sweats, worsening LLL airspace disease.   NOTE: massive bleed during trach placement originally  Plan: -position on right side (pt likes to lay on left) -agree with empiric abx (zosyn), low threshold addition vanc with nosocomial exposure -sputum culture -continue PT/OT efforts -f/u cxr in am -aspiration precautions -O2 to support sats 90-95%  -IF he does not clear his secretions well through a 4 trach, I will upsize him myself to a 6 at bedside WED -pcxr will follow, if atx worse etc, this would then indicate this need   Abdominal Pain  Plan: -defer to primary, consider KUB   Canary Brim, NP-C Pulmonary and Critical Care Medicine [facility] Pager: [phone]  06/02/2012, 10:30 AM I  have fully examined this patient and agree with above findings.    And edited in full   [name]. [name], MD, FACP Pgr: [phone] [facility]'

## 2012-06-02 NOTE — Progress Notes (Signed)
Subjective/Complaints: More breathing issues overnight. Quite anxious as he was having frequent secretions. Didn't sleep much as a result. Frequent desats. A 12 point review of systems has been performed and if not noted above is otherwise negative.   Objective: Vital Signs: Blood pressure 116/56, pulse 88, temperature 100.4 F (38 C), temperature source Oral, resp. rate 19, weight 84.3 kg (185 lb 13.6 oz), SpO2 94.00%. Dg Chest 2 View  06/01/2012  *RADIOLOGY REPORT*  Clinical Data: Respiratory distress.  Tracheostomy.  CHEST - 2 VIEW  Comparison: 05/26/2012.  Findings: Tracheostomy appears unchanged compared to prior. Increasing opacity at the left lung base compatible with airspace disease and atelectasis.  Right lung base appears little changed allowing for technique.  The cardiopericardial silhouette and mediastinal contours are unchanged.  IVC filter incidentally noted on the lateral view.  IMPRESSION: Worsening airspace disease and atelectasis at the left lung base. Unchanged tracheostomy.   Original Report Authenticated By: Andreas Newport, M.D.     Recent Labs  06/01/12 0700  WBC 12.4*  HGB 11.3*  HCT 36.1*  PLT 223    Recent Labs  06/01/12 0700  NA 136  K 3.7  CL 96  GLUCOSE 122*  BUN 21  CREATININE 0.62  CALCIUM 9.1   CBG (last 3)  No results found for this basename: GLUCAP,  in the last 72 hours  Wt Readings from Last 3 Encounters:  06/02/12 84.3 kg (185 lb 13.6 oz)  05/29/12 78.7 kg (173 lb 8 oz)  05/29/12 78.7 kg (173 lb 8 oz)    Physical Exam:  HENT: oral mucosa pink, dry especially tongue Head: Normocephalic and atraumatic.  Left Ear: External ear normal.  Eyes: Conjunctivae normal and EOM are normal. Pupils are equal, round, and reactive to light.  Neck: Normal range of motion. Neck supple.  Tracheostomy tube in place, #4 cuffless.. No drainage. No cap. Able to speak around trach Cardiovascular:  No murmur heard. Cardiac rate controlled at present. No  murmur or rub  Pulmonary/Chest: Mild to moderate respiratory distress. Still able to speak/whisper Upper airway sounds/secretions. Decreased movement at bases.   Abdominal: Soft.  Abdomen is soft with positive bowel sounds. PEG tube in place  Neurological: He is alert.  Patient   able to talk around trach in a whisper. He remains  distracted.  UE 2+ to 3 proxm with deltoid, biceps, triceps to 4- distally. LE are 2+ to 3HF and KE, 4 with ADF and APF. No gross sensory deficits.  Skin: Skin is warm and dry.  Numerous ecchymoses and bruises on arms, legs   Assessment/Plan: 1. Functional deficits secondary to deconditioning after vent dependent respiratory failure and multiple medical issues with associated encephalopathy which require 3+ hours per day of interdisciplinary therapy in a comprehensive inpatient rehab setting. Physiatrist is providing close team supervision and 24 hour management of active medical problems listed below. Physiatrist and rehab team continue to assess barriers to discharge/monitor patient progress toward functional and medical goals. FIM: FIM - Bathing Bathing Steps Patient Completed: Right Arm;Left Arm (sitting EOB) Bathing: 1: Total-Patient completes 0-2 of 10 parts or less than 25% (pt refused bathing, only completing arms and face himself)  FIM - Upper Body Dressing/Undressing Upper body dressing/undressing: 0: Activity did not occur (pt refused to get dressed, wanting to keep gown on) FIM - Lower Body Dressing/Undressing Lower body dressing/undressing: 0: Wears gown/pajamas-no public clothing (pt refused to dress, wanting to keep gown on)  FIM - Toileting Toileting steps completed by patient: Adjust  clothing prior to toileting;Performs perineal hygiene;Adjust clothing after toileting Toileting: 0: Activity did not occur  FIM - Diplomatic Services operational officer Devices: Bedside commode Toilet Transfers: 4-To toilet/BSC: Min A (steadying Pt. >  75%);4-From toilet/BSC: Min A (steadying Pt. > 75%)  FIM - Bed/Chair Transfer Bed/Chair Transfer Assistive Devices: Therapist, occupational: 5: Supine > Sit: Supervision (verbal cues/safety issues);5: Sit > Supine: Supervision (verbal cues/safety issues);4: Bed > Chair or W/C: Min A (steadying Pt. > 75%);4: Chair or W/C > Bed: Min A (steadying Pt. > 75%)  FIM - Locomotion: Wheelchair Locomotion: Wheelchair: 1: Travels less than 50 ft with moderate assistance (Pt: 50 - 74%) FIM - Locomotion: Ambulation Locomotion: Ambulation Assistive Devices: Designer, industrial/product Ambulation/Gait Assistance: 4: Min assist Locomotion: Ambulation: 1: Travels less than 50 ft with minimal assistance (Pt.>75%)  Comprehension Comprehension Mode: Auditory Comprehension: 3-Understands basic 50 - 74% of the time/requires cueing 25 - 50%  of the time  Expression Expression Mode: Verbal Expression Assistive Devices: 6-Talk trach valve Expression: 2-Expresses basic 25 - 49% of the time/requires cueing 50 - 75% of the time. Uses single words/gestures.  Social Interaction Social Interaction: 2-Interacts appropriately 25 - 49% of time - Needs frequent redirection.  Problem Solving Problem Solving: 3-Solves basic 50 - 74% of the time/requires cueing 25 - 49% of the time  Memory Memory: 2-Recognizes or recalls 25 - 49% of the time/requires cueing 51 - 75% of the time  Medical Problem List and Plan:  1. Severe deconditioning encephalopathy after CHF, [name].  2. DVT Prophylaxis/Anticoagulation: Right lower extremity DVT/pulmonary emboli. IVC filter 05/04/2012 as well as Coumadin therapy.  3. Mood/ delirium. Seroquel 50mg  in am and 100mg  in pm, Klonopin 1 mg twice a day.   sleep chart.  -wean meds this week, beginning with day time seroquel -out of restraints 4. Neuropsych: This patient is not capable of making decisions on his/her own behalf.  5. Tracheostomy/pulmonary. Status post  04/29/2012. Currently #4 cuffless trach in place. Speech therapy followup for PMV.   -cxr with increased infiltrate at left base. Sounds worse today.  -begin iv zosyn, recheck cxr today  -q6 hr robitussin  -CCM contacted but did not see patient  -continue PMV trials.  -not ready for decannulation yet  -aspiration precautions 6. Dysphagia. Status post gastrostomy PEG tube 05/19/2012. Continue nutritional support   -increased H2O flushes to 250 cc yesterday 7. COPD/tobacco abuse. Continue nebs as directed  8. New onset atrial fib/RVR. Continue Lopressor as directed-rate controlled currently  9. Recurrent ileus. Resolved and monitored  LOS (Days) 4 A FACE TO FACE EVALUATION WAS PERFORMED  Cody [name] T 06/02/2012 7:46 AM

## 2012-06-02 NOTE — Progress Notes (Signed)
06/02/12 Patient with thick, tan copius secretions. O2 saturations ranging from 71%  To 94%. Trach suction multiple times during shift by nurse and respiratory therapist. Trach care done at 0030. Patient restless and anxious during shift. Patient with poor safety awareness. Am temperature 100.7. Deatra Ina, PA notified. Order given for Tylenol 650mg  once. Dayshift nurse will follow up with temperature. A. Onesha Krebbs,LPN

## 2012-06-02 NOTE — Plan of Care (Signed)
Problem: RH BLADDER ELIMINATION Goal: RH STG MANAGE BLADDER WITH ASSISTANCE STG Manage Bladder With min Assistance  Outcome: Not Progressing Patient requires mod assist due to urgency and spills urinal   Problem: RH SKIN INTEGRITY Goal: RH STG SKIN FREE OF INFECTION/BREAKDOWN Will remain free of skin infection/breakdown with min assistance.  Outcome: Not Progressing Max assist required  Goal: RH STG ABLE TO PERFORM INCISION/WOUND CARE W/ASSISTANCE STG Able To Perform Incision/Wound Care With max Assistance.  Outcome: Not Progressing Requires max assist

## 2012-06-02 NOTE — Plan of Care (Signed)
Problem: RH SAFETY Goal: RH STG ADHERE TO SAFETY PRECAUTIONS W/ASSISTANCE/DEVICE STG Adhere to Safety Precautions With max Assistance/Device.  Outcome: Not Progressing 06/02/12 Patient with poor safety awareness, getting out of bed. A. Cody Severns,LPN

## 2012-06-03 ENCOUNTER — Encounter (HOSPITAL_COMMUNITY): Payer: Self-pay | Admitting: Occupational Therapy

## 2012-06-03 ENCOUNTER — Inpatient Hospital Stay (HOSPITAL_COMMUNITY)
Admission: AD | Admit: 2012-06-03 | Discharge: 2012-06-18 | DRG: 208 | Disposition: A | Discharge status: Skilled Nursing Facility | Payer: Medicaid Other | Source: Other Acute Inpatient Hospital | Attending: Internal Medicine | Admitting: Internal Medicine

## 2012-06-03 ENCOUNTER — Inpatient Hospital Stay (HOSPITAL_COMMUNITY): Payer: Self-pay | Admitting: Physical Therapy

## 2012-06-03 ENCOUNTER — Inpatient Hospital Stay (HOSPITAL_COMMUNITY): Payer: Medicaid Other

## 2012-06-03 ENCOUNTER — Inpatient Hospital Stay (HOSPITAL_COMMUNITY): Payer: Medicaid Other | Admitting: Physical Therapy

## 2012-06-03 ENCOUNTER — Inpatient Hospital Stay (HOSPITAL_COMMUNITY): Payer: Medicaid Other | Admitting: Speech Pathology

## 2012-06-03 ENCOUNTER — Encounter (HOSPITAL_COMMUNITY)
Admission: RE | Disposition: A | Discharge status: Other Acute Inpatient Hospital | Payer: Self-pay | Source: Intra-hospital | Attending: Physical Medicine & Rehabilitation

## 2012-06-03 ENCOUNTER — Encounter (HOSPITAL_COMMUNITY): Payer: Self-pay

## 2012-06-03 DIAGNOSIS — E870 Hyperosmolality and hypernatremia: Secondary | ICD-10-CM

## 2012-06-03 DIAGNOSIS — Z8673 Personal history of transient ischemic attack (TIA), and cerebral infarction without residual deficits: Secondary | ICD-10-CM

## 2012-06-03 DIAGNOSIS — J9600 Acute respiratory failure, unspecified whether with hypoxia or hypercapnia: Secondary | ICD-10-CM

## 2012-06-03 DIAGNOSIS — T85698A Other mechanical complication of other specified internal prosthetic devices, implants and grafts, initial encounter: Secondary | ICD-10-CM | POA: Diagnosis not present

## 2012-06-03 DIAGNOSIS — Z8679 Personal history of other diseases of the circulatory system: Secondary | ICD-10-CM

## 2012-06-03 DIAGNOSIS — I2699 Other pulmonary embolism without acute cor pulmonale: Secondary | ICD-10-CM

## 2012-06-03 DIAGNOSIS — J449 Chronic obstructive pulmonary disease, unspecified: Secondary | ICD-10-CM

## 2012-06-03 DIAGNOSIS — M7981 Nontraumatic hematoma of soft tissue: Secondary | ICD-10-CM

## 2012-06-03 DIAGNOSIS — T45515A Adverse effect of anticoagulants, initial encounter: Secondary | ICD-10-CM | POA: Diagnosis not present

## 2012-06-03 DIAGNOSIS — I4891 Unspecified atrial fibrillation: Secondary | ICD-10-CM

## 2012-06-03 DIAGNOSIS — K219 Gastro-esophageal reflux disease without esophagitis: Secondary | ICD-10-CM

## 2012-06-03 DIAGNOSIS — J15211 Pneumonia due to Methicillin susceptible Staphylococcus aureus: Secondary | ICD-10-CM | POA: Diagnosis present

## 2012-06-03 DIAGNOSIS — I10 Essential (primary) hypertension: Secondary | ICD-10-CM

## 2012-06-03 DIAGNOSIS — Y833 Surgical operation with formation of external stoma as the cause of abnormal reaction of the patient, or of later complication, without mention of misadventure at the time of the procedure: Secondary | ICD-10-CM | POA: Diagnosis not present

## 2012-06-03 DIAGNOSIS — R5381 Other malaise: Secondary | ICD-10-CM

## 2012-06-03 DIAGNOSIS — J4489 Other specified chronic obstructive pulmonary disease: Secondary | ICD-10-CM | POA: Diagnosis present

## 2012-06-03 DIAGNOSIS — D638 Anemia in other chronic diseases classified elsewhere: Secondary | ICD-10-CM | POA: Diagnosis present

## 2012-06-03 DIAGNOSIS — R1310 Dysphagia, unspecified: Secondary | ICD-10-CM | POA: Diagnosis present

## 2012-06-03 DIAGNOSIS — E785 Hyperlipidemia, unspecified: Secondary | ICD-10-CM

## 2012-06-03 DIAGNOSIS — F411 Generalized anxiety disorder: Secondary | ICD-10-CM | POA: Diagnosis not present

## 2012-06-03 DIAGNOSIS — J90 Pleural effusion, not elsewhere classified: Secondary | ICD-10-CM

## 2012-06-03 DIAGNOSIS — Z930 Tracheostomy status: Secondary | ICD-10-CM

## 2012-06-03 DIAGNOSIS — G4700 Insomnia, unspecified: Secondary | ICD-10-CM | POA: Diagnosis not present

## 2012-06-03 DIAGNOSIS — N179 Acute kidney failure, unspecified: Secondary | ICD-10-CM

## 2012-06-03 DIAGNOSIS — J690 Pneumonitis due to inhalation of food and vomit: Secondary | ICD-10-CM | POA: Diagnosis present

## 2012-06-03 DIAGNOSIS — J189 Pneumonia, unspecified organism: Secondary | ICD-10-CM

## 2012-06-03 DIAGNOSIS — R197 Diarrhea, unspecified: Secondary | ICD-10-CM | POA: Diagnosis not present

## 2012-06-03 DIAGNOSIS — J9819 Other pulmonary collapse: Secondary | ICD-10-CM | POA: Diagnosis present

## 2012-06-03 DIAGNOSIS — E876 Hypokalemia: Secondary | ICD-10-CM | POA: Diagnosis not present

## 2012-06-03 DIAGNOSIS — G9340 Encephalopathy, unspecified: Secondary | ICD-10-CM

## 2012-06-03 DIAGNOSIS — J9602 Acute respiratory failure with hypercapnia: Secondary | ICD-10-CM

## 2012-06-03 DIAGNOSIS — J9620 Acute and chronic respiratory failure, unspecified whether with hypoxia or hypercapnia: Principal | ICD-10-CM | POA: Diagnosis present

## 2012-06-03 DIAGNOSIS — F17200 Nicotine dependence, unspecified, uncomplicated: Secondary | ICD-10-CM

## 2012-06-03 DIAGNOSIS — X58XXXA Exposure to other specified factors, initial encounter: Secondary | ICD-10-CM

## 2012-06-03 DIAGNOSIS — R7981 Abnormal blood-gas level: Secondary | ICD-10-CM | POA: Diagnosis present

## 2012-06-03 DIAGNOSIS — G9341 Metabolic encephalopathy: Secondary | ICD-10-CM

## 2012-06-03 DIAGNOSIS — Y9210 Unspecified residential institution as the place of occurrence of the external cause: Secondary | ICD-10-CM | POA: Diagnosis not present

## 2012-06-03 DIAGNOSIS — J869 Pyothorax without fistula: Secondary | ICD-10-CM

## 2012-06-03 DIAGNOSIS — Z888 Allergy status to other drugs, medicaments and biological substances status: Secondary | ICD-10-CM

## 2012-06-03 DIAGNOSIS — I82409 Acute embolism and thrombosis of unspecified deep veins of unspecified lower extremity: Secondary | ICD-10-CM | POA: Diagnosis present

## 2012-06-03 HISTORY — PX: VIDEO BRONCHOSCOPY: SHX5072

## 2012-06-03 LAB — PROTIME-INR: INR: 2.55 — ABNORMAL HIGH (ref 0.00–1.49)

## 2012-06-03 LAB — MRSA PCR SCREENING: MRSA by PCR: NEGATIVE

## 2012-06-03 SURGERY — VIDEO BRONCHOSCOPY WITHOUT FLUORO
Anesthesia: Moderate Sedation | Laterality: Bilateral

## 2012-06-03 MED ORDER — PANTOPRAZOLE SODIUM 40 MG IV SOLR
40.0000 mg | INTRAVENOUS | Status: DC
  Administered 2012-06-03: 40 mg via INTRAVENOUS
  Filled 2012-06-03 (×2): qty 40

## 2012-06-03 MED ORDER — HEPARIN SODIUM (PORCINE) 5000 UNIT/ML IJ SOLN
5000.0000 [IU] | Freq: Three times a day (TID) | INTRAMUSCULAR | Status: DC
  Administered 2012-06-03: 5000 [IU] via SUBCUTANEOUS
  Filled 2012-06-03 (×2): qty 1

## 2012-06-03 MED ORDER — VANCOMYCIN HCL IN DEXTROSE 1-5 GM/200ML-% IV SOLN
1000.0000 mg | Freq: Three times a day (TID) | INTRAVENOUS | Status: DC
  Administered 2012-06-03 – 2012-06-05 (×5): 1000 mg via INTRAVENOUS
  Filled 2012-06-03 (×7): qty 200

## 2012-06-03 MED ORDER — ACETAMINOPHEN 160 MG/5ML PO SOLN
650.0000 mg | Freq: Four times a day (QID) | ORAL | Status: DC | PRN
  Administered 2012-06-03: 650 mg
  Filled 2012-06-03: qty 20.3

## 2012-06-03 MED ORDER — MIDAZOLAM HCL 5 MG/ML IJ SOLN
INTRAMUSCULAR | Status: AC
  Filled 2012-06-03: qty 2

## 2012-06-03 MED ORDER — BIOTENE DRY MOUTH MT LIQD
1.0000 "application " | Freq: Four times a day (QID) | OROMUCOSAL | Status: DC
  Administered 2012-06-04 – 2012-06-18 (×39): 15 mL via OROMUCOSAL

## 2012-06-03 MED ORDER — ALBUTEROL SULFATE HFA 108 (90 BASE) MCG/ACT IN AERS
6.0000 | INHALATION_SPRAY | RESPIRATORY_TRACT | Status: DC | PRN
  Filled 2012-06-03: qty 6.7

## 2012-06-03 MED ORDER — FENTANYL CITRATE 0.05 MG/ML IJ SOLN
INTRAMUSCULAR | Status: DC | PRN
  Administered 2012-06-03 (×2): 50 ug via INTRAVENOUS

## 2012-06-03 MED ORDER — CHLORHEXIDINE GLUCONATE 0.12 % MT SOLN
15.0000 mL | Freq: Two times a day (BID) | OROMUCOSAL | Status: DC
  Administered 2012-06-03 – 2012-06-17 (×20): 15 mL via OROMUCOSAL
  Filled 2012-06-03 (×31): qty 15

## 2012-06-03 MED ORDER — PIPERACILLIN-TAZOBACTAM 3.375 G IVPB
3.3750 g | Freq: Three times a day (TID) | INTRAVENOUS | Status: DC
  Administered 2012-06-03 – 2012-06-18 (×45): 3.375 g via INTRAVENOUS
  Filled 2012-06-03 (×51): qty 50

## 2012-06-03 MED ORDER — WARFARIN SODIUM 10 MG PO TABS
10.0000 mg | ORAL_TABLET | Freq: Once | ORAL | Status: DC
  Filled 2012-06-03: qty 1

## 2012-06-03 MED ORDER — LEVALBUTEROL HCL 0.63 MG/3ML IN NEBU
1.2500 mg | INHALATION_SOLUTION | Freq: Four times a day (QID) | RESPIRATORY_TRACT | Status: DC
  Administered 2012-06-03: 1.25 mg via RESPIRATORY_TRACT
  Filled 2012-06-03 (×7): qty 6

## 2012-06-03 MED ORDER — SODIUM CHLORIDE 0.9 % IV SOLN
INTRAVENOUS | Status: DC
  Administered 2012-06-03: 1000 mL via INTRAVENOUS

## 2012-06-03 MED ORDER — FENTANYL CITRATE 0.05 MG/ML IJ SOLN
INTRAMUSCULAR | Status: AC
  Filled 2012-06-03: qty 4

## 2012-06-03 MED ORDER — IPRATROPIUM BROMIDE 0.02 % IN SOLN
0.5000 mg | Freq: Four times a day (QID) | RESPIRATORY_TRACT | Status: DC
  Administered 2012-06-03: 0.5 mg via RESPIRATORY_TRACT
  Filled 2012-06-03: qty 2.5

## 2012-06-03 MED ORDER — FENTANYL CITRATE 0.05 MG/ML IJ SOLN
100.0000 ug | INTRAMUSCULAR | Status: DC | PRN
  Administered 2012-06-03 – 2012-06-04 (×2): 100 ug via INTRAVENOUS
  Filled 2012-06-03 (×2): qty 2

## 2012-06-03 MED ORDER — MIDAZOLAM HCL 10 MG/2ML IJ SOLN
INTRAMUSCULAR | Status: DC | PRN
  Administered 2012-06-03 (×2): 1 mg via INTRAVENOUS

## 2012-06-03 NOTE — Progress Notes (Signed)
ANTICOAGULATION CONSULT NOTE - Follow Up Consult  Pharmacy Consult for coumadin Indication: PE/DVT (s/p IVC filter)  Allergies  Allergen Reactions  . Protamine Anaphylaxis    Patient Measurements: Weight: 184 lb 1.4 oz (83.5 kg) Heparin Dosing Weight:   Vital Signs: Temp: 98.7 F (37.1 C) (02/26 0542) Temp src: Oral (02/26 0542) BP: 93/51 mmHg (02/26 0542) Pulse Rate: 86 (02/26 0738)  Labs:  Recent Labs  06/01/12 0700 06/02/12 0532 06/03/12 0630  HGB 11.3*  --   --   HCT 36.1*  --   --   PLT 223  --   --   LABPROT 22.0* 22.9* 26.2*  INR 2.01* 2.13* 2.55*  CREATININE 0.62  --   --     The CrCl is unknown because both a height and weight (above a minimum accepted value) are required for this calculation.   Medications:  Scheduled:  . antiseptic oral rinse  15 mL Mouth Rinse q12n4p  . chlorhexidine  15 mL Mouth Rinse BID  . clonazePAM  1 mg Per Tube BID  . feeding supplement  30 mL Per Tube TID  . free water  250 mL Per Tube Q6H  . guaiFENesin  200 mg Oral Q6H  . levalbuterol  0.63 mg Nebulization Q6H  . metoprolol tartrate  25 mg Oral BID  . piperacillin-tazobactam (ZOSYN)  IV  3.375 g Intravenous Q8H  . QUEtiapine  100 mg Per Tube QHS  . QUEtiapine  50 mg Oral Daily  . senna-docusate  2 tablet Oral QHS  . [COMPLETED] warfarin  10 mg Oral ONCE-1800  . Warfarin - Pharmacist Dosing Inpatient   Does not apply q1800   Infusions:  . feeding supplement (JEVITY 1.2 CAL) 1,000 mL (06/02/12 2246)  . [DISCONTINUED] feeding supplement (JEVITY 1.2 CAL) 1,000 mL (06/01/12 1742)    Assessment: 62 yo male with PE/DVT is currently on therapeutic coumadin.  INR today is 2.55 from 2.13.   Goal of Therapy:  INR 2-3    Plan:  1) Coumadin 10mg  po x1 again tonight. 2) INR in am.  Bayard Hugger, PharmD, BCPS  Clinical Pharmacist  Pager: [phone]   06/03/2012,9:30 AM

## 2012-06-03 NOTE — Procedures (Signed)
Bronchoscopy Procedure Note MONTEL VANDERHOOF [id number] [birth year]  Procedure: Bronchoscopy Indications: Obtain specimens for culture and/or other diagnostic studies therapeutic  Procedure Details Consent: Risks of procedure as well as the alternatives and risks of each were explained to the (patient/caregiver).  Consent for procedure obtained. Time Out: Verified patient identification, verified procedure, site/side was marked, verified correct patient position, special equipment/implants available, medications/allergies/relevent history reviewed, required imaging and test results available.  Performed  In preparation for procedure, patient was given 100% FiO2 and bronchoscope lubricated. Sedation: Benzodiazepines  Airway entered and the following bronchi were examined: RUL, RML, RLL, LUL, LLL and Bronchi.   Procedures performed: Brushings performed Bronchoscope removed.    Evaluation Hemodynamic Status: BP stable throughout; O2 sats: stable throughout Patient's Current Condition: stable Specimens:  Sent serosanguinous fluid Complications: No apparent complications Patient did tolerate procedure well.   [name]. 06/03/2012   1. Thin serous secretions all lobes 2. LLL BAL total 55 cc, yield 25%, sent  Tolerated well  [name]. [name], MD, FACP Pgr: [phone] [facility] ]

## 2012-06-03 NOTE — Plan of Care (Signed)
Problem: RH SKIN INTEGRITY Goal: RH STG MAINTAIN SKIN INTEGRITY WITH ASSISTANCE STG Maintain Skin Integrity With mod Assistance.  Outcome: Not Progressing Patient needs total assistance.

## 2012-06-03 NOTE — Plan of Care (Signed)
Problem: RH SAFETY Goal: RH STG ADHERE TO SAFETY PRECAUTIONS W/ASSISTANCE/DEVICE STG Adhere to Safety Precautions With max Assistance/Device.  Outcome: Not Progressing Poor safety awareness.

## 2012-06-03 NOTE — Patient Care Conference (Signed)
Inpatient RehabilitationTeam Conference and Plan of Care Update Date: 06/02/2012   Time: 2:55  PM    Patient Name: Cody [name]      Medical Record Number: [id number]  Date of Birth: [birth year] Sex: Male         Room/Bed: 4025/4025-01 Payor Info: Payor: MEDICAID PENDING  Plan: MEDICAID PENDING  Product Type: *No Product type*     Admitting Diagnosis: N Pulmonary infiltrates  Admit Date/Time:  05/29/2012  4:58 PM Admission Comments: No comment available   Primary Diagnosis:  Physical deconditioning Principal Problem: Physical deconditioning  Patient Active Problem List   Diagnosis Date Noted  . Physical deconditioning 06/01/2012  . Tracheostomy status 05/13/2012  . PE (pulmonary embolism) 05/03/2012  . Atrial fibrillation 04/26/2012  . Acute encephalopathy 04/12/2012  . Acute kidney injury 04/12/2012  . Acute respiratory failure with hypercapnia 04/12/2012  . COPD (chronic obstructive pulmonary disease) 06/19/2011  . HYPERLIPIDEMIA 01/15/2008  . TOBACCO USE 01/15/2008  . GERD 11/18/2006  . HYPERTENSION 11/13/2006  . CEREBROVASCULAR ACCIDENT, HX OF 11/03/2006    Expected Discharge Date: Expected Discharge Date:  (2 weeks vs SNF)  Team Members Present: Physician leading conference: Dr. Faith Rogue Nurse Present: Carmie End, RN PT Present: Reggy Eye, PT OT Present: Roney Mans, Loistine Chance, OT SLP Present: Feliberto Gottron, SLP Other (Discipline and Name): Tora Duck, PPS Coordinator     Current Status/Progress Goal Weekly Team Focus  Medical   severe deconditioning after respiratory failuire, encephalopathy  improve respiratory exchange and activity tolerance  rx pneumonia, manage secretions, limit neurosedating meds   Bowel/Bladder   continent bowel and bladder with occaisional urine spill LBM 06/01/12  mod assist      Swallow/Nutrition/ Hydration   NPO  Supervision with least restrictive diet  Trials of ice chips, Pharyngeal stregthening exercises    ADL's   Decreased task initiation, completion, safety awareness, need to monitor O2 SATs (70's-90's w/ activity). Max A LE ADL's  Supervision level   Task initiation, Participation, safety,  increased endurance related to ADL's and self care tasks   Mobility   min assist with sit <> stands and transfers, very limited gait. Decreased safety awareness, attention, memory.  supervision  Increase endurance, strength, safety with all mobility. Improve attention to task, memory, safety awareness.    Communication   Mod A with PMSV  Supervision  Increase tolerance of PMSV, increased breath support for speech   Safety/Cognition/ Behavioral Observations  Mod A  supervision  attention, problem solving, orientation   Pain   no complaints of pain         Skin   no skin breakdown  min assist       Rehab Goals Patient on target to meet rehab goals: Yes *See Interdisciplinary Assessment and Plan and progress notes for long and short-term goals  Barriers to Discharge: poor pulmonary,activity tolerance, reduced insight and awareness    Possible Resolutions to Barriers:  full supervision at home, education on care for medical needs    Discharge Planning/Teaching Needs:  Home with family who cannot provide absolute 24/7 supervision (current goals) due to fiance's work schedule and grandtrs in school.  May have to consider SNF if pt cannot be left alone for a few hours each day.      Team Discussion:  Continues with close medical/ pulmonary management.  May need to replace with larger trach (PCCM following).  Is showing improved cognition, however, endurance remains very poor.  Medically fluctuating currently which is affecting his  ability to complete therapies.  Revisions to Treatment Plan:  None at this time.   Continued Need for Acute Rehabilitation Level of Care: The patient requires daily medical management by a physician with specialized training in physical medicine and rehabilitation for the  following conditions: Daily direction of a multidisciplinary physical rehabilitation program to ensure safe treatment while eliciting the highest outcome that is of practical value to the patient.: Yes Daily medical management of patient stability for increased activity during participation in an intensive rehabilitation regime.: Yes Daily analysis of laboratory values and/or radiology reports with any subsequent need for medication adjustment of medical intervention for : Pulmonary problems;Neurological problems  Cody [name] 06/03/2012, 1:30 PM

## 2012-06-03 NOTE — Progress Notes (Signed)
Subjective/Complaints: Had a much better night per RN and patient. Secretions decreased. A 12 point review of systems has been performed and if not noted above is otherwise negative.   Objective: Vital Signs: Blood pressure 93/51, pulse 86, temperature 98.7 F (37.1 C), temperature source Oral, resp. rate 22, weight 83.5 kg (184 lb 1.4 oz), SpO2 95.00%. Dg Chest 2 View  06/01/2012  *RADIOLOGY REPORT*  Clinical Data: Respiratory distress.  Tracheostomy.  CHEST - 2 VIEW  Comparison: 05/26/2012.  Findings: Tracheostomy appears unchanged compared to prior. Increasing opacity at the left lung base compatible with airspace disease and atelectasis.  Right lung base appears little changed allowing for technique.  The cardiopericardial silhouette and mediastinal contours are unchanged.  IVC filter incidentally noted on the lateral view.  IMPRESSION: Worsening airspace disease and atelectasis at the left lung base. Unchanged tracheostomy.   Original Report Authenticated By: Andreas Newport, M.D.     Recent Labs  06/01/12 0700  WBC 12.4*  HGB 11.3*  HCT 36.1*  PLT 223    Recent Labs  06/01/12 0700  NA 136  K 3.7  CL 96  GLUCOSE 122*  BUN 21  CREATININE 0.62  CALCIUM 9.1   CBG (last 3)  No results found for this basename: GLUCAP,  in the last 72 hours  Wt Readings from Last 3 Encounters:  06/03/12 83.5 kg (184 lb 1.4 oz)  05/29/12 78.7 kg (173 lb 8 oz)  05/29/12 78.7 kg (173 lb 8 oz)    Physical Exam:  HENT: oral mucosa pink, dry especially tongue Head: Normocephalic and atraumatic.  Left Ear: External ear normal.  Eyes: Conjunctivae normal and EOM are normal. Pupils are equal, round, and reactive to light.  Neck: Normal range of motion. Neck supple.  Tracheostomy tube in place, #4 cuffless.. No drainage. No cap. Able to speak around trach still Cardiovascular:  No murmur heard. Cardiac rate controlled at present. No murmur or rub  Pulmonary/Chest: no obvious respiratory  distress. Still able to speak/whisper Decreased upper airway sounds and secretions. Decreased sounds left greater than right base.   Abdominal: Soft.  Abdomen is soft with positive bowel sounds. PEG tube in place  Neurological: He is alert.  Patient  able to talk around trach in a whisper. He remains  distracted.  UE 2+ to 3 proxm with deltoid, biceps, triceps to 4- distally. LE are  3-HF and KE, 4 with ADF and APF. No gross sensory deficits.  Skin: Skin is warm and dry.  Numerous ecchymoses and bruises on arms, legs   Assessment/Plan: 1. Functional deficits secondary to deconditioning after vent dependent respiratory failure and multiple medical issues with associated encephalopathy which require 3+ hours per day of interdisciplinary therapy in a comprehensive inpatient rehab setting. Physiatrist is providing close team supervision and 24 hour management of active medical problems listed below. Physiatrist and rehab team continue to assess barriers to discharge/monitor patient progress toward functional and medical goals. FIM: FIM - Bathing Bathing Steps Patient Completed: Chest;Right Arm;Left Arm;Abdomen;Front perineal area;Right upper leg;Left upper leg Bathing: 3: Mod-Patient completes 5-7 65f 10 parts or 50-74% (cueing for initiation and thoroughness)  FIM - Upper Body Dressing/Undressing Upper body dressing/undressing steps patient completed: Thread/unthread right sleeve of pullover shirt/dresss;Thread/unthread left sleeve of pullover shirt/dress;Put head through opening of pull over shirt/dress;Pull shirt over trunk Upper body dressing/undressing: 5: Set-up assist to: Obtain clothing/put away FIM - Lower Body Dressing/Undressing Lower body dressing/undressing steps patient completed: Thread/unthread right underwear leg;Thread/unthread left underwear leg;Pull underwear up/down  Lower body dressing/undressing: 2: Max-Patient completed 25-49% of tasks (pt could begin sock donning, could not  pull on all the way)  FIM - Toileting Toileting steps completed by patient: Adjust clothing prior to toileting;Performs perineal hygiene;Adjust clothing after toileting Toileting: 0: Activity did not occur  FIM - Diplomatic Services operational officer Devices: Bedside commode Toilet Transfers: 0-Activity did not occur  FIM - Banker Devices: HOB elevated Bed/Chair Transfer: 3: Supine > Sit: Mod A (lifting assist/Pt. 50-74%/lift 2 legs;3: Sit > Supine: Mod A (lifting assist/Pt. 50-74%/lift 2 legs);3: Bed > Chair or W/C: Mod A (lift or lower assist);3: Chair or W/C > Bed: Mod A (lift or lower assist)  FIM - Locomotion: Wheelchair Locomotion: Wheelchair: 2: Travels 50 - 149 ft with supervision, cueing or coaxing FIM - Locomotion: Ambulation Locomotion: Ambulation Assistive Devices: Designer, industrial/product Ambulation/Gait Assistance: 4: Min assist Locomotion: Ambulation: 1: Travels less than 50 ft with minimal assistance (Pt.>75%)  Comprehension Comprehension Mode: Auditory Comprehension: 3-Understands basic 50 - 74% of the time/requires cueing 25 - 50%  of the time  Expression Expression Mode: Verbal Expression Assistive Devices: 6-Talk trach valve Expression: 2-Expresses basic 25 - 49% of the time/requires cueing 50 - 75% of the time. Uses single words/gestures.  Social Interaction Social Interaction: 2-Interacts appropriately 25 - 49% of time - Needs frequent redirection.  Problem Solving Problem Solving: 3-Solves basic 50 - 74% of the time/requires cueing 25 - 49% of the time  Memory Memory: 2-Recognizes or recalls 25 - 49% of the time/requires cueing 51 - 75% of the time  Medical Problem List and Plan:  1. Severe deconditioning encephalopathy after CHF, [name].  2. DVT Prophylaxis/Anticoagulation: Right lower extremity DVT/pulmonary emboli. IVC filter 05/04/2012 as well as Coumadin therapy.  3. Mood/  delirium. Seroquel 50mg  in am and 100mg  in pm, Klonopin 1 mg twice a day.   sleep chart.  -wean meds this week, beginning with day time seroquel -out of restraints 4. Neuropsych: This patient is not capable of making decisions on his/her own behalf.  5. Tracheostomy/pulmonary. Status post 04/29/2012. Currently #4 cuffless trach in place. Speech therapy followup for PMV.   -follow up cxr again today  -continue IV zosyn  -q6 hr robitussin  -CCM to follow up also  -continue PMV trials.  -based on presentation today, I'm hopeful we can keep his #4 trach in place  -aspiration precautions 6. Dysphagia. Status post gastrostomy PEG tube 05/19/2012. Continue nutritional support   -increased H2O flushes to 250 cc- follow up labs 7. COPD/tobacco abuse. Continue nebs as directed  8. New onset atrial fib/RVR. Continue Lopressor as directed-rate controlled currently, bp's ok 9. Recurrent ileus. Resolved and monitored  LOS (Days) 5 A FACE TO FACE EVALUATION WAS PERFORMED  SWARTZ,ZACHARY T 06/03/2012 7:49 AM

## 2012-06-03 NOTE — Progress Notes (Signed)
Speech Language Pathology Daily Session Note  Patient Details  Name: Cody [name] MRN: [id number] Date of Birth: 16-Mar-1951  Today's Date: 06/03/2012 Time: 4098-1191 Time Calculation (min): 25 min  Short Term Goals: Week 1: SLP Short Term Goal 1 (Week 1): Pt will perform pharyngeal strengthening exercises with Mod A verbal and visual cues.  SLP Short Term Goal 2 (Week 1): Pt will consume trials of puree textures with Mod A verbal cues with minimal overt s/s of aspiration SLP Short Term Goal 3 (Week 1): Pt will consume trials of thin liquids with minimal overt s/s of aspiration with Mod A verbal cues.  SLP Short Term Goal 4 (Week 1): Pt will demonstrate increased vocal intensity at the phrase level with PMSV in place with Mod A verbal and visual cues.  SLP Short Term Goal 5 (Week 1): Pt will demonstrate orientaion to person, place, time and situation with Mod A semantic and visual cues.  SLP Short Term Goal 6 (Week 1): Pt will sustain attention to a functional task for 5 minutes with Mod A verbal cues for redirection   Skilled Therapeutic Interventions: Treatment focus on speech goals. Pt asleep in bed upon entering room and required verbal and tactile stimulation to open eyes. Pt repositioned in bed and required Max A verbal and tactile cues for focused attention to clinician and to keep his eyes open. PMSV donned and pt's vital signs were stable (O2: 91%, HR: 87), however, pt unable to phonate due to decreased breath support and shallow breathing (RR: ~33).  Clinician performed oral care. PMSV was removed due to pt's extreme fatigue and lethargy and treatment session ended early.    FIM:  Comprehension Comprehension Mode: Auditory Comprehension: 3-Understands basic 50 - 74% of the time/requires cueing 25 - 50%  of the time Expression Expression Mode: Verbal Expression Assistive Devices: 6-Talk trach valve Expression: 2-Expresses basic 25 - 49% of the time/requires cueing 50 - 75% of the  time. Uses single words/gestures. Social Interaction Social Interaction: 2-Interacts appropriately 25 - 49% of time - Needs frequent redirection. Problem Solving Problem Solving: 2-Solves basic 25 - 49% of the time - needs direction more than half the time to initiate, plan or complete simple activities Memory Memory: 2-Recognizes or recalls 25 - 49% of the time/requires cueing 51 - 75% of the time FIM - Eating Eating Activity: 1: Helper performs IV, parenteral, or tube feeding  Pain Pain Assessment Pain Assessment: No/denies pain  Therapy/Group: Individual Therapy  Yalexa Blust 06/03/2012, 2:17 PM

## 2012-06-03 NOTE — Progress Notes (Signed)
Social Work Patient ID: Cody [name], male   DOB: [birth year], 62 y.o.   MRN: [id number]  Have reviewed team conference with pt's fiance, [name].  (Patient sleeping soundly).  She is aware of team concerns with goals at supervision.  If only limited gains made, she feels she will have to pursue SNF.  Plan to monitor progress through this next week.  Currently under work-up by Pulmonary services and not able to participate fully with therapies.  Will continne to follow.  [name], LCSW

## 2012-06-03 NOTE — Progress Notes (Signed)
Pt sent to respiratory dept for bronchoscopy per Dr. [name] following 3 units FFP to go to endo post procedure for recovery.  MD called to unit to note need to admit to acute post procedure and not return to rehab. Dan Finland PAC notified of information and to discharge pt. Pts son notified of change in pt status and change of rooms. Pamelia Hoit

## 2012-06-03 NOTE — Progress Notes (Signed)
Bronchoscopy performed with intervention Bronchial Washing. Trach changed to Shiley 6 cuffed. Correct placement confirmed with bronchoscopy and chest xray. New Trach secured with velcro ties. Pt recovered in Endoscopy before transfer to 2104. Pt tolerated procedure well.

## 2012-06-03 NOTE — Progress Notes (Signed)
Physical Therapy Session Note  Patient Details  Name: Cody [name] MRN: [id number] Date of Birth: [birth year]  Today's Date: 06/03/2012 Time: Session #1:  [id number], Session #2: 1305 Time Calculation (min): Session #1: 42 min, Session #2: pt refused- missed 30 mins  Short Term Goals: Week 1:  PT Short Term Goal 1 (Week 1): Patient will be able to perform bed mobility with Supervised/Mod-I assist. PT Short Term Goal 2 (Week 1): Patient will be able to perform transfers with Supervised/min-Assist PT Short Term Goal 3 (Week 1): Patient will be able to ambulate 47' using LRAD with Supervised/min-Assist while maintaining O2 sats > 90%. PT Short Term Goal 4 (Week 1): Patient will be able to ascend/descend 4 steps with handrails with Supervised/min-Assist  Skilled Therapeutic Interventions/Progress Updates:    Session #1: Pt reporting he is not feeling well today. Very weak.  Agreeable to PT with frequent rest breaks and at his own pace.  Bed mobility with HOB 20 degrees and pulling on therapist and railing min assist of trunk.  Bed to [facility] with RW min assist to get to standing, verbal cues for safe hand placement and safe use of RW.  WC mobility 150' x 2 with supervision, 3-4 seated rest breaks while using the WC due to upper extremity fatigue.  Transfer to Nu Step from WC min assist.  Nu Step x 10 mins upper and lower extremities level 1 with 4 seated rest breaks due to fatigue.  VSS throughout session on 40% trach collar 10 L O2.  sats in the mid to upper 90s throughout session.     Session #2: Pt refusing to participate reporting too fatigued.  Missed 30 mins of therapy.   Therapy Documentation Precautions:  Precautions Precautions: Fall Precaution Comments: trach collar, peg Restrictions Weight Bearing Restrictions: No General: Amount of Missed PT Time (min): 18 Minutes Missed Time Reason: Patient fatigue Vital Signs: Therapy Vitals Temp: 98.6 F (37 C) Temp src: Oral Pulse Rate:  94 Resp: 20 BP: 98/57 mmHg (RN notified) Patient Position, if appropriate: Lying Oxygen Therapy SpO2: 99 % O2 Device: Trach collar Locomotion : Wheelchair Mobility Distance: 150   See FIM for current functional status  Therapy/Group: Individual Therapy  Lurena Joiner B. Ritik Stavola, PT, DPT [phone]   06/03/2012, 12:57 PM

## 2012-06-03 NOTE — Plan of Care (Signed)
Problem: RH SKIN INTEGRITY Goal: RH STG ABLE TO PERFORM INCISION/WOUND CARE W/ASSISTANCE STG Able To Perform Incision/Wound Care With max Assistance.  Outcome: Not Met (add Reason) Staff performing PEG care

## 2012-06-03 NOTE — Procedures (Signed)
Retrach upsize   To endo Consent from patient , ffp given chloraprep at site, old trach removed prior. Lido plus epi 5 cc field block around stoma Placed wire direct visualization , no resistance, then placed progressive dilator over glider Then placed 6 trach with 26 french dilator, easily passed. All removed except trach. Cap pos, blood loss minimal Equal BS, tolerated well  [name]. [name], MD, FACP Pgr: [phone]  Pulmonary & Critical Care

## 2012-06-03 NOTE — Discharge Summary (Signed)
  Discharge summary job # [id number]

## 2012-06-03 NOTE — Progress Notes (Signed)
ANTIBIOTIC CONSULT NOTE - INITIAL  Pharmacy Consult for vanc/zosyn Indication: rule out sepsis  Allergies  Allergen Reactions  . Protamine Anaphylaxis    Patient Measurements:   Adjusted Body Weight:   Vital Signs: Temp: 98.6 F (37 C) (02/26 1437) Temp src: Axillary (02/26 1437) BP: 100/61 mmHg (02/26 1800) Pulse Rate: 92 (02/26 1900) Intake/Output from previous day:   Intake/Output from this shift:    Labs:  Recent Labs  06/01/12 0700  WBC 12.4*  HGB 11.3*  PLT 223  CREATININE 0.62   The CrCl is unknown because both a height and weight (above a minimum accepted value) are required for this calculation. No results found for this basename: VANCOTROUGH, VANCOPEAK, VANCORANDOM, GENTTROUGH, GENTPEAK, GENTRANDOM, TOBRATROUGH, TOBRAPEAK, TOBRARND, AMIKACINPEAK, AMIKACINTROU, AMIKACIN,  in the last 72 hours   Microbiology: No results found for this or any previous visit (from the past 720 hour(s)).  Medical History: Past Medical History  Diagnosis Date  . Stroke   . Hypertension   . Hyperlipidemia   . GERD (gastroesophageal reflux disease)   . COPD (chronic obstructive pulmonary disease)     Medications:  Scheduled:  . [START ON 06/04/2012] antiseptic oral rinse  1 application Mouth Rinse QID  . chlorhexidine  15 mL Mouth/Throat BID  . heparin subcutaneous  5,000 Units Subcutaneous Q8H  . pantoprazole (PROTONIX) IV  40 mg Intravenous Q24H   Assessment: 62 yo who was recently tx to rehab after being treated for PNA/flu. He was doing well until 2/25 where he developed fevers and worsening CXR. He was seen by rx on rehab for coumadin dosing. His INR was 2.55 this AM. Looks like he was reversed with 3 units of FFP. He was transferred back to 2100 for further management. He was previously on vanc and his level was therapeutic on vanc 1g q8  Goal of Therapy:  Vancomycin trough level 15-20 mcg/ml  Plan:  Vanc 1g IV q8 Zosyn 3.375g IV q8  Ulyses Southward  English 06/03/2012,7:43 PM

## 2012-06-03 NOTE — Progress Notes (Signed)
PULMONARY  / CRITICAL CARE MEDICINE  Name: Cody [name] MRN: [id number] DOB: 10/18/1950    ADMISSION DATE:  05/29/2012 CONSULTATION DATE:  2/25   REFERRING MD :  Dr. Riley Kill  CHIEF COMPLAINT:  Desaturations, LLL atx  BRIEF PATIENT DESCRIPTION: 62 y/o M with PMH of COPD, GERD, HLD, HTN, CVA admitted 1/5-2/21 with VDRF secondary to influenza A and aspiration PNA.  Required trach 1/22.  Hospital course complicated by new PE, Bilateral DVT s/p IVC filter 1/27.  2/24 noted to have worsened LLL atx on cxr.  2/25 mild temp (100.4) and worsened cxr.  PCCM consulted for concern of LLL PNA.    SIGNIFICANT EVENTS / STUDIES:  1/5-2/21 - Admit to [facility] for VDRF 2/2 flu, asp PNA s/p trach   CULTURES: 2/25 Sputum>>>   ANTIBIOTICS: Zosyn 2/25>>>  HISTORY OF PRESENT ILLNESS:  62 y/o M with PMH of COPD, GERD, HLD, HTN, CVA admitted 1/5-2/21 with VDRF secondary to influenza A and aspiration PNA.  Required trach 1/22.  Hospital course complicated by new PE, Bilateral DVT s/p IVC filter 1/27.  Transferred to [facility] inpatient Rehab.  Had been progressing well but continued generalized weakness.  2/24 noted to have worsened LLL atx on cxr.  2/25 mild temp (100.4) and worsened cxr.     SUBJECTIVE:  Pt with moderate secretions and not able to cough up effectively Only has size 4.0 trach  VITAL SIGNS: Temp:  [97.7 F (36.5 C)-99.3 F (37.4 C)] 98.7 F (37.1 C) (02/26 0542) Pulse Rate:  [80-95] 86 (02/26 0738) Resp:  [18-28] 22 (02/26 0738) BP: (93-120)/(51-78) 93/51 mmHg (02/26 0542) SpO2:  [88 %-95 %] 95 % (02/26 0738) FiO2 (%):  [40 %] 40 % (02/26 0738) Weight:  [83.5 kg (184 lb 1.4 oz)] 83.5 kg (184 lb 1.4 oz) (02/26 0500)  PHYSICAL EXAMINATION: General:  Chronically ill in NAD Neuro:  AAOx4, MAE, generalized weakness HEENT:  #4 trach midline, c/d/i Cardiovascular:  s1s2 rrr, no m/r/g Lungs:  Coarse rhonchi Abdomen:  Round/soft, bsx4 active, PEG c/d/i Musculoskeletal:  No acute  deformities Skin:  Warm/dry, trach LE edema   Recent Labs Lab 05/28/12 0435 05/29/12 0510 06/01/12 0700  NA 146* 144 136  K 3.8 3.7 3.7  CL 103 103 96  CO2 34* 34* 34*  BUN 31* 28* 21  CREATININE 0.91 0.82 0.62  GLUCOSE 152* 160* 122*    Recent Labs Lab 05/28/12 0435 06/01/12 0700  HGB 13.2 11.3*  HCT 42.5 36.1*  WBC 14.5* 12.4*  PLT 222 223   Dg Chest 2 View  06/01/2012  *RADIOLOGY REPORT*  Clinical Data: Respiratory distress.  Tracheostomy.  CHEST - 2 VIEW  Comparison: 05/26/2012.  Findings: Tracheostomy appears unchanged compared to prior. Increasing opacity at the left lung base compatible with airspace disease and atelectasis.  Right lung base appears little changed allowing for technique.  The cardiopericardial silhouette and mediastinal contours are unchanged.  IVC filter incidentally noted on the lateral view.  IMPRESSION: Worsening airspace disease and atelectasis at the left lung base. Unchanged tracheostomy.   Original Report Authenticated By: Andreas Newport, M.D.     ASSESSMENT / PLAN:  Acute on Chronic Respiratory Failure s/p Trach LLL Airspace Disease - ATX and infiltrate c/w HCAP and mucus plugging Fever  62 y/o M with complex medical hx, recent prolonged admit with profound critical illness,. Transferred to [facility] CIR and had been progressing well.  2/24 began feeling poorly.  2/25 with fevers, chills, sweats, worsening LLL airspace disease.  NOTE: massive bleed during trach placement originally  Plan: -THis pt needs a size 6.0 trach and would benefit from a bronch to clear out lungs Dr [name] will arrange this in endoscopy this PM  Dorcas Carrow Beeper  [phone]  Cell  [phone]  If no response or cell goes to voicemail, call beeper [phone]  06/03/2012, 10:06 AM

## 2012-06-03 NOTE — Plan of Care (Signed)
Problem: RH SKIN INTEGRITY Goal: RH STG MAINTAIN SKIN INTEGRITY WITH ASSISTANCE STG Maintain Skin Integrity With mod Assistance.  Outcome: Not Progressing Patient need total assist with wound care.

## 2012-06-03 NOTE — Progress Notes (Signed)
Occupational Therapy Session Note  Patient Details  Name: Cody [name] MRN: [id number] Date of Birth: [birth year]  Today's Date: 06/03/2012 Time: 0730-0825 Time Calculation (min): 55 min  Skilled Therapeutic Interventions/Progress Updates:    Pt seen for individual OT treatment session for ADL, bathing and dressing. Pt required increased encouragement to participate after initially refusing. He completed grooming, bathing activities at EOB level. Pt had fluctuating O2 SATS throughout session ranging from high 70's to 90's, and mod-max cueing for rest breaks and breathing techniques. He appeared very congested and was awaiting respiratory therapy per RN report.  He required vc's throughout session for task initiation and completetion, as well as hand placement, safety with transfers. Pt refused to get dressed fully, including donning pants and TED hose, despite maximum encouragement to do so. RN was made aware of this.  After bathing/dressing was complete, pt was agreeable to sit up in wheelchair rather than get back into bed.   Therapy Documentation Precautions:  Precautions Precautions: Fall Precaution Comments:  (Trach and PEG tube) Restrictions Weight Bearing Restrictions: No General:   Vital Signs: Therapy Vitals Pulse Rate: 86 Resp: 22 Oxygen Therapy SpO2: 95 % O2 Device: Trach collar O2 Flow Rate (L/min): 10 L/min FiO2 (%): 40 % Pain:  Pt expressed fatigue/lethergy, no c/o pain.       See FIM for current functional status  Therapy/Group: Individual Therapy  Alm Bustard 06/03/2012, 10:32 AM

## 2012-06-04 ENCOUNTER — Encounter (HOSPITAL_COMMUNITY): Payer: Self-pay | Admitting: *Deleted

## 2012-06-04 ENCOUNTER — Inpatient Hospital Stay (HOSPITAL_COMMUNITY): Payer: Self-pay | Admitting: Physical Therapy

## 2012-06-04 ENCOUNTER — Encounter (HOSPITAL_COMMUNITY): Payer: Self-pay | Admitting: Internal Medicine

## 2012-06-04 ENCOUNTER — Inpatient Hospital Stay (HOSPITAL_COMMUNITY): Payer: Self-pay | Admitting: Speech Pathology

## 2012-06-04 ENCOUNTER — Inpatient Hospital Stay (HOSPITAL_COMMUNITY): Payer: Medicaid Other | Admitting: Speech Pathology

## 2012-06-04 ENCOUNTER — Inpatient Hospital Stay (HOSPITAL_COMMUNITY): Payer: Medicaid Other

## 2012-06-04 LAB — PHOSPHORUS: Phosphorus: 3.6 mg/dL (ref 2.3–4.6)

## 2012-06-04 LAB — BASIC METABOLIC PANEL
CO2: 35 mEq/L — ABNORMAL HIGH (ref 19–32)
Calcium: 8.9 mg/dL (ref 8.4–10.5)
Chloride: 95 mEq/L — ABNORMAL LOW (ref 96–112)
Creatinine, Ser: 0.65 mg/dL (ref 0.50–1.35)
GFR calc Af Amer: >90 mL/min (ref >90)
Sodium: 137 mEq/L (ref 135–145)

## 2012-06-04 LAB — CBC
MCV: 90.2 fL (ref 78.0–100.0)
Platelets: 257 10*3/uL (ref 150–400)
RBC: 3.48 MIL/uL — ABNORMAL LOW (ref 4.22–5.81)
RDW: 15.6 % — ABNORMAL HIGH (ref 11.5–15.5)
WBC: 12.5 10*3/uL — ABNORMAL HIGH (ref 4.0–10.5)

## 2012-06-04 LAB — PREPARE FRESH FROZEN PLASMA
Unit division: 0
Unit division: 0
Unit division: 0

## 2012-06-04 LAB — GLUCOSE, CAPILLARY
Glucose-Capillary: 115 mg/dL — ABNORMAL HIGH (ref 70–99)
Glucose-Capillary: 87 mg/dL (ref 70–99)
Glucose-Capillary: 93 mg/dL (ref 70–99)
Glucose-Capillary: 97 mg/dL (ref 70–99)

## 2012-06-04 LAB — PROTIME-INR: INR: 2.21 — ABNORMAL HIGH (ref 0.00–1.49)

## 2012-06-04 LAB — POCT I-STAT 3, ART BLOOD GAS (G3+)
Bicarbonate: 37.1 mEq/L — ABNORMAL HIGH (ref 20.0–24.0)
TCO2: 39 mmol/L (ref 0–100)
pH, Arterial: 7.485 — ABNORMAL HIGH (ref 7.350–7.450)
pO2, Arterial: 75 mmHg — ABNORMAL LOW (ref 80.0–100.0)

## 2012-06-04 LAB — MAGNESIUM: Magnesium: 1.7 mg/dL (ref 1.5–2.5)

## 2012-06-04 MED ORDER — FREE WATER: 250.0000 mL | Freq: Four times a day (QID) | Status: DC

## 2012-06-04 MED ORDER — WARFARIN SODIUM 10 MG PO TABS
10.0000 mg | ORAL_TABLET | Freq: Once | ORAL | Status: AC
  Administered 2012-06-04: 10 mg via ORAL
  Filled 2012-06-04: qty 1

## 2012-06-04 MED ORDER — PRO-STAT SUGAR FREE PO LIQD
30.0000 mL | Freq: Three times a day (TID) | ORAL | Status: DC
  Filled 2012-06-04 (×3): qty 30

## 2012-06-04 MED ORDER — FENTANYL CITRATE 0.05 MG/ML IJ SOLN
50.0000 ug | INTRAMUSCULAR | Status: DC | PRN
  Administered 2012-06-04 – 2012-06-06 (×3): 50 ug via INTRAVENOUS
  Filled 2012-06-04 (×3): qty 2

## 2012-06-04 MED ORDER — JEVITY 1.2 CAL PO LIQD
1000.0000 mL | ORAL | Status: DC
Start: 2012-06-04 — End: 2012-06-04
  Filled 2012-06-04 (×3): qty 1000

## 2012-06-04 MED ORDER — FAMOTIDINE 40 MG/5ML PO SUSR
20.0000 mg | Freq: Two times a day (BID) | ORAL | Status: DC
  Administered 2012-06-04 – 2012-06-07 (×8): 20 mg
  Filled 2012-06-04 (×11): qty 2.5

## 2012-06-04 MED ORDER — FREE WATER
100.0000 mL | Freq: Three times a day (TID) | Status: DC
  Administered 2012-06-04 – 2012-06-08 (×12): 100 mL

## 2012-06-04 MED ORDER — WARFARIN - PHARMACIST DOSING INPATIENT
Freq: Every day | Status: DC
  Administered 2012-06-08: 17:00:00

## 2012-06-04 MED ORDER — SODIUM CHLORIDE 0.9 % IV SOLN: INTRAVENOUS | Status: DC | PRN

## 2012-06-04 MED ORDER — PRO-STAT SUGAR FREE PO LIQD
30.0000 mL | Freq: Four times a day (QID) | ORAL | Status: DC
  Administered 2012-06-04 – 2012-06-07 (×15): 30 mL
  Filled 2012-06-04 (×19): qty 30

## 2012-06-04 MED ORDER — PRO-STAT SUGAR FREE PO LIQD: 30.0000 mL | Freq: Three times a day (TID) | ORAL | Status: DC

## 2012-06-04 MED ORDER — OXEPA PO LIQD
1000.0000 mL | ORAL | Status: DC
  Administered 2012-06-04: 13:00:00
  Administered 2012-06-05 – 2012-06-07 (×3): 1000 mL
  Filled 2012-06-04 (×7): qty 1000

## 2012-06-04 NOTE — Progress Notes (Signed)
INITIAL NUTRITION ASSESSMENT  DOCUMENTATION CODES  Per approved criteria   -Severe malnutrition in the context of acute illness or injury   Intervention:   Initiate Oxepa formula at 20 ml/hr and increase 10 ml every 4 hours to reach goal rate of 50 ml/hr with Prostat liquid protein 30 ml 4 times daily. This regimen will provide 2200 kcal, 135 grams of protein, and 942 ml of free water.  RD to follow for nutrition care plan  Nutrition Dx:   Increased nutrient needs related to PNA, acute illness as evidenced by estimated nutrition needs  Goal:   EN to meet >/= 90% of estimated nutrition needs  Monitor:   EN regimen & tolerance, respiratory status, weight, labs, I/O's  Reason for Assessment: Consult for EN initiation and management   Assessment:   [name], [name]; [name]  2/25, pt with mild temp (100.4) and worsened cxr. Pt admitted on 2/26 with acute respiratory failure. PCCM consulted for concern of LLL PNA. retrach and bronch, need for ventilator support.   2/27 Small left pleural effusion with increasing infiltration in the left lung. Per MD note, pt to go back to rehab after PNA treated and resolved.    Prior to acute re-admission, pt receiving Jevity 1.2 at 70 ml/hr x 20 hours (off for therapies) via PEG tube with Prostat liquid protein 30 ml 3 times daily via tube. This regimen provided 1980 total kcals, 123 gm protein and 1130 ml of water. Pt received free water flushes of 250 ml QID - provides an additional 1 liter of fluid daily; total free water provision is 2130 ml.  Pt currently receiving free water flushes at 100 ml every 8 hours.  Pt transitioned off ventilator support to trach collar this am. Nutritional Management consulted to start TF.  Dietetic Intern to initiate Oxepa formula given PNA and ruling out of sepsis.   Patient meets criteria for severe  MALNUTRITION in the context of acute illness or injury given 22% weight loss in < 2 months and severe muscle loss (shoulders, clavicles, thighs & calfs).   Height: Ht Readings from Last 1 Encounters:  05/28/12 5\' 10"  (1.778 m)    Weight Status:   Wt Readings from Last 10 Encounters:  06/04/12 186 lb 8.2 oz (84.6 kg)  06/03/12 184 lb 1.4 oz (83.5 kg)  06/03/12 184 lb 1.4 oz (83.5 kg)  05/29/12 173 lb 8 oz (78.7 kg)  05/29/12 173 lb 8 oz (78.7 kg)  03/25/12 229 lb (103.874 kg)  02/28/12 226 lb (102.513 kg)  09/09/11 212 lb (96.163 kg)  08/05/11 214 lb (97.07 kg)  06/18/11 218 lb (98.884 kg)    Ideal body weight: 166 lbs   Percent Ideal body weight: 112%  Admit weight: 179 lb  Estimated needs:  Kcal: 2000 - 2200 Protein: 125 - 135 grams Fluid: 2 - 2.2 liters  Skin: Stage I pressure ulcer to sacrum; abdominal incision  Diet Order: NPO   Intake/Output Summary (Last 24 hours) at 06/04/12 0914 Last data filed at 06/04/12 0900  Gross per 24 hour  Intake   1800 ml  Output    500 ml  Net   1300 ml    Last BM: 06/03/2012   Labs:   Recent Labs Lab 05/29/12 0510 06/01/12 0700 06/04/12 0430  NA 144 136 137  K 3.7 3.7 3.6  CL 103 96 95*  CO2 34* 34*  35*  BUN 28* 21 14  CREATININE 0.82 0.62 0.65  CALCIUM 9.6 9.1 8.9  MG  --   --  1.7  PHOS  --   --  3.6  GLUCOSE 160* 122* 119*    CBG (last 3)   Recent Labs  06/04/12 0008 06/04/12 0418 06/04/12 0748  GLUCAP 97 93 93    Scheduled Meds: . antiseptic oral rinse  1 application Mouth Rinse QID  . chlorhexidine  15 mL Mouth/Throat BID  . pantoprazole (PROTONIX) IV  40 mg Intravenous Q24H  . piperacillin-tazobactam (ZOSYN)  IV  3.375 g Intravenous Q8H  . vancomycin  1,000 mg Intravenous Q8H    Continuous Infusions: . sodium chloride 100 mL/hr at 06/04/12 0700    Belenda Cruise  Dietetic Intern Pager: [phone]  Agree with above note.  Maureen Chatters, RD, LDN Pager #: [phone]

## 2012-06-04 NOTE — Progress Notes (Signed)
ANTICOAGULATION CONSULT NOTE - Follow Up Consult  Pharmacy Consult for coumadin Indication: PE/DVT (s/p IVC filter)  Allergies  Allergen Reactions  . Protamine Anaphylaxis    Patient Measurements: Weight: 186 lb 8.2 oz (84.6 kg) Heparin Dosing Weight:   Vital Signs: Temp: 99.5 F (37.5 C) (02/27 0807) Temp src: Oral (02/27 0422) BP: 121/68 mmHg (02/27 0900) Pulse Rate: 95 (02/27 0900)  Labs:  Recent Labs  06/02/12 0532 06/03/12 0630 06/04/12 0430  HGB  --   --  10.1*  HCT  --   --  31.4*  PLT  --   --  257  LABPROT 22.9* 26.2* 23.6*  INR 2.13* 2.55* 2.21*  CREATININE  --   --  0.65    The CrCl is unknown because both a height and weight (above a minimum accepted value) are required for this calculation.   Medications:  Scheduled:  . antiseptic oral rinse  1 application Mouth Rinse QID  . chlorhexidine  15 mL Mouth/Throat BID  . famotidine  20 mg Per Tube BID  . free water  100 mL Per Tube Q8H  . piperacillin-tazobactam (ZOSYN)  IV  3.375 g Intravenous Q8H  . vancomycin  1,000 mg Intravenous Q8H  . [DISCONTINUED] heparin subcutaneous  5,000 Units Subcutaneous Q8H  . [DISCONTINUED] pantoprazole (PROTONIX) IV  40 mg Intravenous Q24H   Infusions:  . sodium chloride 100 mL/hr at 06/04/12 0700    Assessment: 62 yo male with PE/DVT is currently on therapeutic coumadin.  INR 2.21 today. Apparently pt received some FFP yesterday for trach and bronch. Coumadin dose was held last PM but resuming today.  Goal of Therapy:  INR 2-3    Plan:  1) Coumadin 10mg  po x1 again tonight. 2) INR in am.

## 2012-06-04 NOTE — Progress Notes (Signed)
Vent changes made

## 2012-06-04 NOTE — Progress Notes (Deleted)
Assessment: 62 yo who was recently tx to rehab after being treated for PNA/flu. He was doing well until 2/25 where he developed fevers and worsening CXR. He was seen by rx on rehab for coumadin dosing. His INR was 2.55 this AM. Looks like he was reversed with 3 units of FFP. He was transferred back to 2100 for further management. He was previously on vanc and his level was therapeutic on vanc 1g q8  Anticoagulation: DVT - INR 2.55 today but was reversed with 3 units FFP. Coumadin on hold. Heparin 5k Infectious Disease: Likely recurrent PNA. Bronch today. Restart vanc/zosyn. PCT 0.11 yesterday.  2/25 Zosyn>> 2/26 vanc>>  2/26 Sputum>>  Cardiovascular: HTN, HLD - BP soft, no meds Endocrinology: CBGs ok Gastrointestinal / Nutrition: GERD - PPI IV Neurology: CVA Nephrology: Scr stable, lytes ok Pulmonary: COPD - trach Hematology / Oncology: CBC stable  PTA Medication Issues: Best Practices: Heparin  Plan  Vanc 1g IV q8 Zosyn 3.375g IV q8

## 2012-06-04 NOTE — Care Management Note (Signed)
    Page 1 of 1   06/04/2012     11:15:16 AM   CARE MANAGEMENT NOTE 06/04/2012  Patient:  CHIP, CANEPA   Account Number:  [id number]  Date Initiated:  06/04/2012  Documentation initiated by:  [facility]  Subjective/Objective Assessment:   Readmitted from CIR.  retrached and vented.  has wife.     Action/Plan:   Anticipated DC Date:  06/11/2012   Anticipated DC Plan:  IP REHAB FACILITY  In-house referral  Clinical Social Worker      DC Planning Services  CM consult      Choice offered to / List presented to:             Status of service:  In process, will continue to follow Medicare Important Message given?   (If response is "NO", the following Medicare IM given date fields will be blank) Date Medicare IM given:   Date Additional Medicare IM given:    Discharge Disposition:    Per UR Regulation:  Reviewed for med. necessity/level of care/duration of stay  If discussed at Long Length of Stay Meetings, dates discussed:    Comments:  Contact:  ContactDerrel Nip [phone]  06-04-12 - 11am [name], RNBSN [phone] admitted from inpt rehab.  retrached and vented.  discussed case with SW - CM will follow for continued needs.

## 2012-06-04 NOTE — Progress Notes (Signed)
Name: Cody [name]  MRN: [id number]  DOB: 06-May-1950  ADMISSION DATE: 05/29/2012   CONSULTATION DATE: 2/25 rehab, now admission 2/26  BRIEF PATIENT DESCRIPTION: 62 y/o M with PMH of COPD, GERD, HLD, HTN, CVA admitted 1/5-2/21 with VDRF secondary to influenza A and aspiration PNA. Required trach 1/22. Hospital course complicated by new PE, Bilateral DVT s/p IVC filter 1/27. 2/24 noted to have worsened LLL atx on cxr. 2/25 mild temp (100.4) and worsened cxr. PCCM consulted for concern of LLL PNA. Cuffed trach placed due to need for vent and FOB performed  CULTURES:  2/25 Sputum >> moderated staph aureus >>  2/26 bronch BAL>>>   ANTIBIOTICS:  Zosyn 2/25>>>  vanc 2/26>>>  SIGNIFICANT EVENTS / STUDIES:  1/5-2/21 - Admit to [facility] for VDRF 2/2 flu, asp PNA s/p trach  2/26 retrach, bronch, distress, vent needed 2/27 off full support. ATC  SUBJECTIVE:  Awake and alert. Afebrile. Tolerating ATC without distress  VITAL SIGNS: Temp:  [98 F (36.7 C)-98.6 F (37 C)] 98.5 F (36.9 C) (02/27 0422) Pulse Rate:  [70-96] 89 (02/27 0700) Resp:  [11-42] 18 (02/27 0700) BP: (86-136)/(45-100) 98/61 mmHg (02/27 0700) SpO2:  [87 %-100 %] 99 % (02/27 0700) FiO2 (%):  [40 %-80 %] 40 % (02/27 0700) Weight:  [186 lb 8.2 oz (84.6 kg)] 186 lb 8.2 oz (84.6 kg) (02/27 0400) HEMODYNAMICS:   VENTILATOR SETTINGS: Vent Mode:  [-] PRVC FiO2 (%):  [40 %-80 %] 40 % Set Rate:  [12 bmp] 12 bmp Vt Set:  [500 mL-550 mL] 500 mL PEEP:  [5 cmH20] 5 cmH20 Plateau Pressure:  [13 cmH20-14 cmH20] 13 cmH20  INTAKE / OUTPUT: Intake/Output     02/26 0701 - 02/27 0700 02/27 0701 - 02/28 0700   I.V. (mL/kg) 1100 (13)    IV Piggyback 500    Total Intake(mL/kg) 1600 (18.9)    Urine (mL/kg/hr) 500    Total Output 500     Net +1100           PHYSICAL EXAMINATION: General:  Alert on [facility] Neuro:  nonfocal HEENT:  Trach clean Cardiovascular:  s1 s2 RRR Lungs:  Coarse breath sounds bilaterally. Abdomen:  Soft, BS  wnl Musculoskeletal:  No edema Skin:  No rash  CXR:  2/27 Findings: Tracheostomy tube tip measures 8.3 cm above the carina. Shallow inspiration. Borderline heart size. There appears to be a small left pleural effusion. There is increasing infiltration in the left lung. This may represent pneumonia. No pneumothorax.  IMPRESSION:  Stable appearance of tracheostomy tube. Small left pleural effusion with increasing infiltration in the left lung.  2/26 LLL hazziness, trach wnl, no ptx  ASSESSMENT / PLAN:  RESPIRATORY  Recent Labs Lab 06/04/12 0139  PHART 7.485*  PCO2ART 49.3*  PO2ART 75.0*  HCO3 37.1*  O2SAT 96.0   A: Acute resp failure, s/p upsize trach, LLL HCAP P:   ATC as long as tolerated Antibiotic as above Pending BAL culture  CARDIAC No results found for this basename: TROPONINI, PROBNP,  in the last 168 hours A: recent PE P:  FFP required for trach Coumadin per pharm.  INFECTIOUS DISEASE  Recent Labs Lab 06/01/12 0700 06/02/12 1221 06/04/12 0430  WBC 12.4*  --  12.5*  PROCALCITON  --  0.11  --    A:  HCAP P:   Follow BAL results Vanc, zosyn > narrow according to cx results  RENAL  Recent Labs Lab 05/29/12 0510 06/01/12 0700 06/04/12 0430  BUN 28*  21 14  CREATININE 0.82 0.62 0.65   I/O last 3 completed shifts: In: 1600 [I.V.:1100; IV Piggyback:500] Out: 500 [Urine:500]  A:  WNL P:   Maintenance volume  ELECTROLYTES  Recent Labs Lab 05/29/12 0510 06/01/12 0700 06/04/12 0430  NA 144 136 137  K 3.7 3.7 3.6  CL 103 96 95*  CO2 34* 34* 35*  BUN 28* 21 14  CREATININE 0.82 0.62 0.65  CALCIUM 9.6 9.1 8.9  MG  --   --  1.7  PHOS  --   --  3.6   A: mild hypochloremia P: Continue to monitor.   HEMATOLOGIC  Recent Labs Lab 05/31/12 0655 06/01/12 0700 06/02/12 0532 06/03/12 0630 06/04/12 0430  HGB  --  11.3*  --   --  10.1*  HCT  --  36.1*  --   --  31.4*  PLT  --  223  --   --  257  INR 1.82* 2.01* 2.13* 2.55* 2.21*   A:   Anemia>>stable       PE P:  Coumadin per pharm   GASTROINTESTINAL  Recent Labs Lab 05/31/12 0655 06/01/12 0700 06/02/12 0532 06/03/12 0630 06/04/12 0430  AST  --  17  --   --   --   ALT  --  27  --   --   --   ALKPHOS  --  99  --   --   --   BILITOT  --  0.3  --   --   --   PROT  --  5.8*  --   --   --   ALBUMIN  --  2.1*  --   --   --   INR 1.82* 2.01* 2.13* 2.55* 2.21*   A: no issues P:   Resume TF Change PPI to H2RB to reduce risk of nosocomial infections (PNA or C diff)  ENDOCRINE  Recent Labs Lab 06/03/12 1753 06/03/12 2019 06/04/12 0008 06/04/12 0418  GLUCAP 99 93 97 93   A:  At risk of Hyperglycemia P:   monitor  NEUROLOGIC A:  No issues P:   monitor  Signed D. Piloto Rolene Arbour, MD Family Medicine  PGY-2 8:09 AM 06/04/2012   I have interviewed and examined the patient and reviewed the database. I have formulated the assessment and plan as reflected in the note above with amendments made by me. 30 mins of direct critical care time provided  Billy Fischer, MD;  PCCM service; Mobile [phone]

## 2012-06-04 NOTE — H&P (Addendum)
Name: Cody [name]  MRN: [id number]  DOB: 1950/12/10  ADMISSION DATE: 05/29/2012   CONSULTATION DATE: 2/25 rehab, now admission 2/26  REFERRING MD : Dr. Riley Kill   CHIEF COMPLAINT: Desaturations, LLL atx, acute resp failure  BRIEF PATIENT DESCRIPTION: 62 y/o M with PMH of COPD, GERD, HLD, HTN, CVA admitted 1/5-2/21 with VDRF secondary to influenza A and aspiration PNA. Required trach 1/22. Hospital course complicated by new PE, Bilateral DVT s/p IVC filter 1/27. 2/24 noted to have worsened LLL atx on cxr. 2/25 mild temp (100.4) and worsened cxr. PCCM consulted for concern of LLL PNA. retrach and bronch, need vent.  SIGNIFICANT EVENTS / STUDIES:  1/5-2/21 - Admit to [facility] for [facility] 2/2 flu, asp PNA s/p trach  2/26 retrach, bronch, distress, vent needed  CULTURES:  2/25 Sputum>>>  2/26 bronch BAL>>>  ANTIBIOTICS:  Zosyn 2/25>>>  vanc 2/26>>>  HISTORY OF PRESENT ILLNESS: 62 y/o M with PMH of COPD, GERD, HLD, HTN, CVA admitted 1/5-2/21 with VDRF secondary to influenza A and aspiration PNA. Required trach 1/22. Hospital course complicated by new PE, Bilateral DVT s/p IVC filter 1/27. Transferred to [facility] inpatient Rehab. Had been progressing well but continued generalized weakness. 2/24 noted to have worsened LLL atx on cxr. 2/25 mild temp (100.4) and worsened cxr. Orion Crook went well, bronch went well. Increased rr, vent needed.  SUBJECTIVE:  Distress, vent to 2100  PAST MEDICAL HISTORY :  Past Medical History  Diagnosis Date  . Stroke   . Hypertension   . Hyperlipidemia   . GERD (gastroesophageal reflux disease)   . COPD (chronic obstructive pulmonary disease)    Past Surgical History  Procedure Laterality Date  . Gastrostomy N/A 05/19/2012    Procedure: PEG Possible Open Gastrostomy;  Surgeon: Kandis Cocking, MD;  Location: [facility] OR;  Service: General;  Laterality: N/A;   Prior to Admission medications   Medication Sig Start Date End Date Taking? Authorizing Provider  amLODipine  (NORVASC) 5 MG tablet Take 1 tablet (5 mg total) by mouth daily. 02/21/12   Lindley Magnus, MD  aspirin 81 MG tablet Take 81 mg by mouth daily.      Historical Provider, MD  budesonide-formoterol (SYMBICORT) 160-4.5 MCG/ACT inhaler Inhale 2 puffs into the lungs 2 (two) times daily. 06/18/11 06/17/12  Lindley Magnus, MD  hydrochlorothiazide (HYDRODIURIL) 25 MG tablet Take 1 tablet (25 mg total) by mouth daily. 02/28/12 02/27/13  Terressa Koyanagi, DO  Ibuprofen-Diphenhydramine HCl (ADVIL PM) 200-25 MG CAPS Take 1 capsule by mouth at bedtime as needed. For sleep    Historical Provider, MD  lisinopril (PRINIVIL,ZESTRIL) 40 MG tablet Take 1 tablet (40 mg total) by mouth daily. 09/09/11 09/08/12  Lindley Magnus, MD   Allergies  Allergen Reactions  . Protamine Anaphylaxis    FAMILY HISTORY:  Family History  Problem Relation Age of Onset  . COPD Mother    SOCIAL HISTORY:  reports that he has been smoking Cigarettes.  He has been smoking about 0.50 packs per day. He has never used smokeless tobacco. He reports that he does not drink alcohol or use illicit drugs.  REVIEW OF SYSTEMS:  Unable pt sedated  SUBJECTIVE:  rr up  VITAL SIGNS: Temp:  [98 F (36.7 C)-98.7 F (37.1 C)] 98.6 F (37 C) (02/27 0010) Pulse Rate:  [82-96] 83 (02/26 2318) Resp:  [11-42] 13 (02/26 2318) BP: (91-136)/(45-100) 91/56 mmHg (02/26 2200) SpO2:  [87 %-100 %] 100 % (02/26 2318) FiO2 (%):  [40 %-  80 %] 40 % (02/26 2318) Weight:  [83.5 kg (184 lb 1.4 oz)] 83.5 kg (184 lb 1.4 oz) (02/26 0500) HEMODYNAMICS:   VENTILATOR SETTINGS: Vent Mode:  [-] PRVC FiO2 (%):  [40 %-80 %] 40 % Set Rate:  [12 bmp] 12 bmp Vt Set:  [550 mL] 550 mL PEEP:  [5 cmH20] 5 cmH20 Plateau Pressure:  [13 cmH20-14 cmH20] 13 cmH20 INTAKE / OUTPUT: Intake/Output     02/26 0701 - 02/27 0700   I.V. 300   IV Piggyback 250   Total Intake 550   Urine 200   Total Output 200   Net +350         PHYSICAL EXAMINATION: General:  Lethargic, rr  up Neuro:  nonfocal HEENT:  Trach clean Cardiovascular:  s1 s2 RRR Lungs:  Coarse lll Abdomen:  Soft, BS wnl Musculoskeletal:  Edema min Skin:  No rash  LABS:  Recent Labs Lab 05/28/12 0435 05/29/12 0510  06/01/12 0700 06/02/12 0532 06/02/12 1221 06/03/12 0630  HGB 13.2  --   --  11.3*  --   --   --   WBC 14.5*  --   --  12.4*  --   --   --   PLT 222  --   --  223  --   --   --   NA 146* 144  --  136  --   --   --   K 3.8 3.7  --  3.7  --   --   --   CL 103 103  --  96  --   --   --   CO2 34* 34*  --  34*  --   --   --   GLUCOSE 152* 160*  --  122*  --   --   --   BUN 31* 28*  --  21  --   --   --   CREATININE 0.91 0.82  --  0.62  --   --   --   CALCIUM 9.7 9.6  --  9.1  --   --   --   AST  --   --   --  17  --   --   --   ALT  --   --   --  27  --   --   --   ALKPHOS  --   --   --  99  --   --   --   BILITOT  --   --   --  0.3  --   --   --   PROT  --   --   --  5.8*  --   --   --   ALBUMIN  --   --   --  2.1*  --   --   --   INR 1.84* 1.59*  < > 2.01* 2.13*  --  2.55*  PROCALCITON  --   --   --   --   --  0.11  --   < > = values in this interval not displayed.  Recent Labs Lab 05/28/12 0739 06/03/12 1753 06/03/12 2019  GLUCAP 136* 99 93    CXR: LLL hazziness, trach wnl, no ptx  ASSESSMENT / PLAN:  PULMONARY A:Acute resp failure, s/p upsize trach, LLL hCAP P:   Start 8 cc/kg, rate 12, peep 5 suport in full overnight with rr up pcxr reviewed Keep 6 cuff up for now on  vent See ID  CARDIOVASCULAR A: PE P:  FFP required for trach Can restart coumadin if no bleeding  RENAL A:  WNL P:   Chem Maintenance volume  GASTROINTESTINAL A:  Vent  P:   Cuff up, on vent, npo ppi  HEMATOLOGIC A:  PE P:  ffp for trach done Coumadin per pharm Dc sub q hep  INFECTIOUS A:  HCAP P:   Send BAl from bronch and follow Vanc, zosyn  ENDOCRINE A:  At risj hyperglycemia  P:   Send glu on chem  NEUROLOGIC A:  Vent needed, debilitation P:   May  need int fent [place] to go back to rehab after pna treated and resolved  TODAY'S SUMMARY: from rehab for trach placement, bronch, declined, vent needed. ABX  I have personally obtained a history, examined the patient, evaluated laboratory and imaging results, formulated the assessment and plan and placed orders. CRITICAL CARE: The patient is critically ill with multiple organ systems failure and requires high complexity decision making for assessment and support, frequent evaluation and titration of therapies, application of advanced monitoring technologies and extensive interpretation of multiple databases. Critical Care Time devoted to patient care services described in this note is 45 minutes.   [name]. [name], MD, FACP Pgr: [phone] [facility]   Pulmonary and Critical Care Medicine [facility] Pager: [phone]  06/04/2012, 12:30 AM

## 2012-06-05 ENCOUNTER — Inpatient Hospital Stay (HOSPITAL_COMMUNITY): Payer: Medicaid Other

## 2012-06-05 LAB — CULTURE, RESPIRATORY W GRAM STAIN

## 2012-06-05 LAB — PROTIME-INR
INR: 2.91 — ABNORMAL HIGH (ref 0.00–1.49)
Prothrombin Time: 28.9 seconds — ABNORMAL HIGH (ref 11.6–15.2)

## 2012-06-05 MED ORDER — VANCOMYCIN HCL 500 MG IV SOLR
500.0000 mg | Freq: Three times a day (TID) | INTRAVENOUS | Status: DC
  Filled 2012-06-05: qty 500

## 2012-06-05 MED ORDER — VANCOMYCIN HCL 1000 MG IV SOLR
750.0000 mg | Freq: Three times a day (TID) | INTRAVENOUS | Status: DC
  Administered 2012-06-05 – 2012-06-06 (×2): 750 mg via INTRAVENOUS
  Filled 2012-06-05 (×5): qty 750

## 2012-06-05 MED ORDER — SODIUM CHLORIDE 0.9 % IV SOLN: INTRAVENOUS | Status: DC | PRN

## 2012-06-05 MED ORDER — WARFARIN SODIUM 5 MG PO TABS
5.0000 mg | ORAL_TABLET | Freq: Once | ORAL | Status: AC
  Administered 2012-06-05: 5 mg via ORAL
  Filled 2012-06-05: qty 1

## 2012-06-05 NOTE — Progress Notes (Signed)
Patient meets weaning criteria, but patient got up to use the bed side toilet and got really winded, patient left on the ventilator at this time, will place on ATC when patient is more calm.

## 2012-06-05 NOTE — Progress Notes (Signed)
ANTICOAGULATION & ANTIBIOTIC CONSULT NOTE - Follow Up Consult  Pharmacy Consult for Warfarin & Vancomycin + Zosyn Indication: Hx PE/DVT & Empiric HCAP coverage  Allergies  Allergen Reactions  . Protamine Anaphylaxis    Patient Measurements: Height: 5' 10.08" (178 cm) Weight: 183 lb 3.2 oz (83.1 kg) IBW/kg (Calculated) : 73.18  Vital Signs: Temp: 98.8 F (37.1 C) (02/28 0351) Temp src: Oral (02/28 0351) BP: 110/66 mmHg (02/28 1125) Pulse Rate: 103 (02/28 1125)  Labs:  Recent Labs  06/03/12 0630 06/04/12 0430 06/05/12 0420  HGB  --  10.1*  --   HCT  --  31.4*  --   PLT  --  257  --   LABPROT 26.2* 23.6* 28.9*  INR 2.55* 2.21* 2.91*  CREATININE  --  0.65  --     Estimated Creatinine Clearance: 96.6 ml/min (by C-G formula based on Cr of 0.65).   Medications:  Anti-infectives   Start     Dose/Rate Route Frequency Ordered Stop   06/05/12 1600  vancomycin (VANCOCIN) 500 mg in sodium chloride 0.9 % 100 mL IVPB     500 mg 100 mL/hr over 60 Minutes Intravenous Every 8 hours 06/05/12 1304     06/03/12 2100  vancomycin (VANCOCIN) IVPB 1000 mg/200 mL premix  Status:  Discontinued     1,000 mg 200 mL/hr over 60 Minutes Intravenous Every 8 hours 06/03/12 1953 06/05/12 1248   06/03/12 2100  piperacillin-tazobactam (ZOSYN) IVPB 3.375 g     3.375 g 12.5 mL/hr over 240 Minutes Intravenous Every 8 hours 06/03/12 1953        Assessment: 62 y.o. M who continues on Vancomycin + Zosyn for empiric HCAP coverage. Tmax/24h: 100, WBC 12.5 << 12.4. Renal function is stable, CrCl~80-90 ml/min. A Vancomycin trough this afternoon was SUPRAtherapeutic (VT 23.7, goal of 15-20 mcg/ml). Will reduce Vancomycin dose today.  The patient also continues on warfarin for recent hx PE/DVT (Jan '14). Noted pt with IVC filter. FFP given 2/26 for trach placement. INR today is therapeutic (INR 2.91 << 2.21, goal of 2-3) however large jump after receiving FFP and missing dose x 1 day. Will lower warfarin  dose today.   Goal of Therapy:  INR 2-3 Vancomycin trough of 15-20 mcg/ml   Plan:  1. Reduce Vancomycin to 750 mg IV every 8 hours 2. Continue Zosyn 3.375g IV every 8 hours 3. Warfarin 5 mg x 1 dose at 1800 today 4. Will continue to follow renal function, culture results, LOT, and antibiotic de-escalation plans  5. Will continue to monitor for any signs/symptoms of bleeding and will follow up with PT/INR in the a.m.   Georgina Pillion, PharmD, BCPS Clinical Pharmacist Pager: [phone] 06/05/2012 1:23 PM

## 2012-06-05 NOTE — Progress Notes (Signed)
D/c'd Vancomycin since pt is MRSA negative and Resp cultures are negative. Continue Zosyn. Signed: D. Piloto Rolene Arbour, MD Family Medicine  PGY-2 \

## 2012-06-05 NOTE — Discharge Summary (Signed)
NAME:  Cody [name], GOAR NO.:  [id number]  MEDICAL RECORD NO.:  [id number]  LOCATION:  [facility]                         FACILITY:  [facility]  PHYSICIAN:  Mariam Dollar, P.A.  DATE OF BIRTH:  04-20-1950  DATE OF ADMISSION:  05/29/2012 DATE OF DISCHARGE:  06/03/2012                              DISCHARGE SUMMARY   DISCHARGE DIAGNOSES: 1. Severe deconditioning with encephalopathy after congestive heart     failure, chronic obstructive pulmonary disease, pneumonia, and     subsequent ventilator-dependent respiratory failure, right lower     extremity deep venous thrombosis-pulmonary emboli with inferior     vena cava filter-Coumadin therapy. 2. Delirium. 3. Tracheostomy-pulmonary-aspiration pneumonia. 4. Dysphagia status post gastrostomy tube. 5. Chronic obstructive pulmonary disease with tobacco abuse. 6. New onset atrial fibrillation,  recurrent ileus.  HISTORY OF PRESENT ILLNESS:  This is a 62 year old right-handed male with history of CVA, COPD with tobacco abuse was admitted April 12, 2012, with increasing shortness of breath x4 days, as well as productive cough, occasional hemoptysis and increased confusion.  He arrived in the emergency department respiratory distress with oxygen saturation 60% on room air, and placed on non-rebreathable mask.  The patient with increasing restlessness, agitation and was intubated.  Chest x-ray with acute pulmonary edema versus atypical pneumonia, placed on broad- spectrum antibiotics as well as intravenous Solu-Medrol.  Long-term ventilatory support with tracheostomy performed April 29, 2012, per Critical Care Medicine, and presently on #6 cuffless trach. Echocardiogram with ejection fraction of 55% without PFO identified. Lower extremity Dopplers were negative on April 13, 2012, repeated May 03, 2012, showing acute DVT, right saphenofemoral junction, and right common femoral vein as well as left saphenofemoral  junction, femoral vein.  CT angio of the chest showing pulmonary embolism with segmental branches to the right upper lobe, left upper lobe, and left lower lobe.  An IVC filter was placed May 04, 2012, as well as intravenous heparin and Coumadin initiated May 07, 2012.  Ongoing bouts of agitation and restlessness, he had received Ativan as well as Haldol, wrist restraints in place for safety.  There was plan for gastrostomy tube placement for nutritional support;  however, the patient developed persistent ileus.  Procedure was placed on hold until May 19, 2012, and gastrostomy tube placed per Dr. Ezzard Standing.  The patient did develop new onset atrial fibrillation with rapid ventricular rate resolved May 09, 2012.  Physical and occupational therapy ongoing.  The patient was admitted for comprehensive rehab program.  PAST MEDICAL HISTORY:  See discharge diagnoses.  SOCIAL HISTORY:  Lives with spouse.  FUNCTIONAL HISTORY:  Prior to admission was independent, working full time.  FUNCTIONAL STATUS:  Upon admission to rehab services was moderate assist for side lying in the bed, +2 total assist scooting to the head of the bed, ambulating 24 feet moderate assist using a rolling walker.  PHYSICAL EXAMINATION:  VITAL SIGNS:  Blood pressure 126/97, pulse 107, temperature 97.5, respirations 20. GENERAL:  This was an alert male.  He did attempt to mouth some words, occasionally able to talk around his trach tube in a whisper.  He was easily distracted.  He made good eye contact with examiner.  He  followed simple commands. LUNGS:  Decreased breath sounds at the bases. CARDIAC:  Rate controlled. ABDOMEN:  Soft, nontender.  Good bowel sounds.  PEG tube in place. Tracheostomy tube with no drainage.  REHABILITATION HOSPITAL COURSE:  The patient was admitted to inpatient rehab services with therapies initiated on a 3-hour daily basis consisting of physical therapy, occupational  therapy, speech therapy, and rehabilitation nursing.  The following issues were addressed during the patient's rehabilitation stay.  Pertaining to Mr. Bruun's severe deconditioning encephalopathy after congestive heart failure, COPD as well as subsequent VDRF.  He continued to attend full therapies and Klonopin was added for restlessness, agitation, slowly monitored as well as Seroquel.  He remained on Coumadin for pulmonary emboli and IVC filter was also in place and no bleeding episodes.  Restraints in place for patient's safety.  Tracheostomy tube in place.  A #6 cuffless followed by Critical Care Medicine.  He had been downsized to a #4 cuffless trach.  Passy Muir valve per speech therapy.  He remained n.p.o. with gastrostomy tube feeds as advised.  The patient with some increased respiratory distress, which Critical Care was made aware of. On  June 01, 2012, chest x-ray showing worsening airspace disease and atelectasis in the lung bases.  The patient was placed on intravenous Zosyn.  Followup Critical Care Medicine, with planned followup chest x-ray, it was felt by Critical Care Medicine, his trach tube should be changed back to a #6 due to respiratory difficulties. Procedure completed to change trach tube by Dr. [name].  The patient tolerated the procedure well,  but later developed increasing respiratory distress who was at the discretion of Critical Care Medicine.  The patient be transferred to acute care services for ongoing monitoring due to respiratory difficulties.  All medication changes were made as per Critical Care Medicine.  He was discharged from rehab services in guarded condition.     Mariam Dollar, P.A.     DA/MEDQ  D:  06/03/2012  T:  06/04/2012  Job:  [id number]  cc:   Ranelle Oyster, M.D.

## 2012-06-05 NOTE — Progress Notes (Signed)
Name: Cody [name]  MRN: [id number]  DOB: 1951-03-25  ADMISSION DATE: 05/29/2012   CONSULTATION DATE: 2/25 rehab, now admission 2/26  BRIEF PATIENT DESCRIPTION: 62 y/o M with PMH of COPD, GERD, HLD, HTN, CVA admitted 1/5-2/21 with VDRF secondary to influenza A and aspiration PNA. Required trach 1/22. Hospital course complicated by new PE, Bilateral DVT s/p IVC filter 1/27. 2/24 noted to have worsened LLL atx on cxr. 2/25 mild temp (100.4) and worsened cxr. PCCM consulted for concern of LLL PNA. Cuffed trach placed due to need for vent and FOB performed  CULTURES:  2/25 Sputum >> moderated staph aureus  2/26 bronch BAL>>> negative  ANTIBIOTICS:  Zosyn 2/25>>>  vanc 2/26>>>  SIGNIFICANT EVENTS / STUDIES:  1/5-2/21 - Admit to [facility] for [facility]DR[facility] 2/2 flu, asp PNA s/p trach  2/26 retrach, bronch, distress, vent needed 2/27 off full support. ATC 2/28 back on to full support 2/28  Back to ATC  SUBJECTIVE:  Awake and alert. Had diarrheas X3  last night. He was ambulating to use bedside commode and had O2 desats. Afebrile.  VITAL SIGNS: Temp:  [98.8 F (37.1 C)-100 F (37.8 C)] 98.8 F (37.1 C) (02/28 0351) Pulse Rate:  [87-122] 114 (02/28 0757) Resp:  [13-28] 28 (02/28 0757) BP: (94-121)/(58-77) 94/62 mmHg (02/28 0757) SpO2:  [96 %-100 %] 100 % (02/28 0757) FiO2 (%):  [40 %] 40 % (02/28 0757) Weight:  [183 lb 3.2 oz (83.1 kg)] 183 lb 3.2 oz (83.1 kg) (02/28 0400) HEMODYNAMICS:   VENTILATOR SETTINGS: Vent Mode:  [-] PRVC FiO2 (%):  [40 %] 40 % Set Rate:  [12 bmp] 12 bmp Vt Set:  [500 mL] 500 mL PEEP:  [5 cmH20] 5 cmH20 Plateau Pressure:  [13 cmH20-14 cmH20] 14 cmH20  INTAKE / OUTPUT: Intake/Output     02/27 0701 - 02/28 0700 02/28 0701 - 03/01 0700   I.V. (mL/kg) 880 (10.6)    Other 530    IV Piggyback 712.5    Total Intake(mL/kg) 2122.5 (25.5)    Urine (mL/kg/hr) 550 (0.3)    Total Output 550     Net +1572.5          Urine Occurrence 4 x    Stool Occurrence 4 x      PHYSICAL EXAMINATION:  General:  Alert on WUA Neuro:  nonfocal HEENT:  Trach clean Cardiovascular:  s1 s2 RRR Lungs:  Coarse breath sounds bilaterally. Some rales on left base.  Abdomen:  Soft, BS wnl Musculoskeletal:  No edema Skin:  No rash  CXR:  2/28 Findings: Tracheostomy tube unchanged in position. Stable cardiac silhouette. There is left lower lobe opacity representing atelectasis and air space disease. Right lung relatively clear. IMPRESSION: 1. No significant change. 2. Left lower lobe atelectasis and air space disease.   2/27 Findings: Tracheostomy tube tip measures 8.3 cm above the carina. Shallow inspiration. Borderline heart size. There appears to be a small left pleural effusion. There is increasing infiltration in the left lung. This may represent pneumonia. No pneumothorax.  IMPRESSION:  Stable appearance of tracheostomy tube. Small left pleural effusion with increasing infiltration in the left lung.  2/26 LLL hazziness, trach wnl, no ptx  ASSESSMENT / PLAN:  RESPIRATORY  Recent Labs Lab 06/04/12 0139  PHART 7.485*  PCO2ART 49.3*  PO2ART 75.0*  HCO3 37.1*  O2SAT 96.0   A: Acute resp failure, s/p upsize trach, LLL HCAP>> clinically improved. No changes on CXR.  Full support due to desats secondary to mobilization>> back  to ATC P:   - ATC - BAL results are negative. - We may be able to narrow antibiotic to Zosyn.   CARDIAC A: recent PE P:  - Coumadin per pharm.  INFECTIOUS DISEASE  Recent Labs Lab 06/01/12 0700 06/02/12 1221 06/04/12 0430  WBC 12.4*  --  12.5*  PROCALCITON  --  0.11  --    A:  HCAP P:   - BAL results negative. - May be able to narrow to Zosyn.   RENAL  Recent Labs Lab 06/01/12 0700 06/04/12 0430  BUN 21 14  CREATININE 0.62 0.65   I/O last 3 completed shifts: In: 3722.5 [I.V.:1980; Other:530; IV Piggyback:1212.5] Out: 850 [Urine:850] A:  No issues P - Monitor   ELECTROLYTES  Recent Labs Lab 06/01/12 0700  06/04/12 0430  NA 136 137  K 3.7 3.6  CL 96 95*  CO2 34* 35*  BUN 21 14  CREATININE 0.62 0.65  CALCIUM 9.1 8.9  MG  --  1.7  PHOS  --  3.6   A: WNL P: - Continue to monitor.   HEMATOLOGIC  Recent Labs Lab 06/01/12 0700 06/02/12 0532 06/03/12 0630 06/04/12 0430 06/05/12 0420  HGB 11.3*  --   --  10.1*  --   HCT 36.1*  --   --  31.4*  --   PLT 223  --   --  257  --   INR 2.01* 2.13* 2.55* 2.21* 2.91*   A:  Anemia>>stable       PE P:  - Coumadin per pharm  GASTROINTESTINAL  Recent Labs Lab 06/01/12 0700 06/02/12 0532 06/03/12 0630 06/04/12 0430 06/05/12 0420  AST 17  --   --   --   --   ALT 27  --   --   --   --   ALKPHOS 99  --   --   --   --   BILITOT 0.3  --   --   --   --   PROT 5.8*  --   --   --   --   ALBUMIN 2.1*  --   --   --   --   INR 2.01* 2.13* 2.55* 2.21* 2.91*   A: Diarrhea X3 P:   - c diff PCR - on  TF - on H2RB to reduce risk of nosocomial infections (PNA or C diff)  ENDOCRINE  Recent Labs Lab 06/04/12 0008 06/04/12 0418 06/04/12 0748 06/04/12 1151 06/04/12 1531  GLUCAP 97 93 93 87 115*   A:  At risk of Hyperglycemia P:   - Monitor - SSI if hyperglycemia to keep 140 - 180.  NEUROLOGIC A:  No issues P:   - monitor  Signed D. Piloto Rolene Arbour, MD Family Medicine  PGY-2 8:21 AM 06/04/2012    STAFF NOTE: I, Dr Lavinia Sharps have personally reviewed patient's available data, including medical history, events of note, physical examination and test results as part of my evaluation. I have discussed with resident/NP and other care providers such as pharmacist, RN and RRT.  In addition,  I personally evaluated patient and elicited key findings of acute on chronic respiratory failure status post upsizing of tracheostomy. This morning he is on full ventilator support. So therefore we will have him show that he can tolerate being off the ventilator before we can transfer him out to rehabilitation. Meanwhile he can be transferred to  stepped-down.  Rest per NP/medical resident whose note is outlined above and that  I agree with     Dr. Kalman Shan, M.D., [facility].C.P Pulmonary and Critical Care Medicine Staff Physician [facility] [place] [facility] Pager: [phone], If no answer or between  15:00h - 7:00h: call [phone]  06/05/2012 2:47 PM

## 2012-06-06 LAB — CBC
HCT: 35.1 % — ABNORMAL LOW (ref 39.0–52.0)
Hemoglobin: 11.2 g/dL — ABNORMAL LOW (ref 13.0–17.0)
MCV: 91.9 fL (ref 78.0–100.0)
RBC: 3.82 MIL/uL — ABNORMAL LOW (ref 4.22–5.81)
WBC: 14 10*3/uL — ABNORMAL HIGH (ref 4.0–10.5)

## 2012-06-06 LAB — RENAL FUNCTION PANEL
BUN: 14 mg/dL (ref 6–23)
Chloride: 101 mEq/L (ref 96–112)
Glucose, Bld: 136 mg/dL — ABNORMAL HIGH (ref 70–99)
Potassium: 2.9 mEq/L — ABNORMAL LOW (ref 3.5–5.1)

## 2012-06-06 LAB — CULTURE, RESPIRATORY W GRAM STAIN

## 2012-06-06 MED ORDER — CLONAZEPAM 0.1 MG/ML ORAL SUSPENSION: 0.5000 mg | Freq: Two times a day (BID) | ORAL | Status: DC | PRN

## 2012-06-06 MED ORDER — WARFARIN SODIUM 7.5 MG PO TABS
7.5000 mg | ORAL_TABLET | Freq: Once | ORAL | Status: AC
  Administered 2012-06-06: 7.5 mg via ORAL
  Filled 2012-06-06: qty 1

## 2012-06-06 MED ORDER — POTASSIUM CHLORIDE 20 MEQ/15ML (10%) PO LIQD
40.0000 meq | ORAL | Status: AC
  Administered 2012-06-06: 40 meq
  Filled 2012-06-06: qty 15

## 2012-06-06 MED ORDER — CLONAZEPAM 0.5 MG PO TABS
0.5000 mg | ORAL_TABLET | Freq: Two times a day (BID) | ORAL | Status: DC | PRN
  Administered 2012-06-06: 0.5 mg
  Filled 2012-06-06: qty 1

## 2012-06-06 NOTE — Progress Notes (Signed)
Pt received from ICU by bed, O2, IV, tele with RN, SN and clinical instructor at bedside.  Report received from [place], [place].  Salomon Mast, RN

## 2012-06-06 NOTE — Progress Notes (Signed)
Name: Cody [name]  MRN: [id number]  DOB: 1950/11/20  ADMISSION DATE: 05/29/2012   CONSULTATION DATE: 2/25 rehab, now admission 2/26  BRIEF PATIENT DESCRIPTION: 62 y/o M with PMH of COPD, GERD, HLD, HTN, CVA admitted 1/5-2/21 with VDRF secondary to influenza A and aspiration PNA. Required trach 1/22. Hospital course complicated by new PE, Bilateral DVT s/p IVC filter 1/27. 2/24 noted to have worsened LLL atx on cxr. 2/25 mild temp (100.4) and worsened cxr. PCCM consulted for concern of LLL PNA. Cuffed trach placed due to need for vent and FOB performed  CULTURES:  2/25 Sputum >> moderated staph aureus  2/26 bronch BAL>>> negative  ANTIBIOTICS:  Zosyn 2/25>>>  vanc 2/26>>>3-1  SIGNIFICANT EVENTS / STUDIES:  1/5-2/21 - Admit to [facility] for [facility] 2/2 flu, asp PNA s/p trach  2/26 retrach, bronch, distress, vent needed 2/27 off full support. ATC 2/28 back on to full support 2/28  Back to ATC  SUBJECTIVE:   Awake and alert on t collar Able to lie supine - no resp distress  VITAL SIGNS: Temp:  [98.8 F (37.1 C)-99 F (37.2 C)] 99 F (37.2 C) (03/01 0756) Pulse Rate:  [92-115] 106 (03/01 0747) Resp:  [16-30] 23 (03/01 0747) BP: (103-137)/(59-87) 110/71 mmHg (03/01 0747) SpO2:  [93 %-100 %] 97 % (03/01 0747) FiO2 (%):  [35 %-40 %] 35 % (03/01 0747) Weight:  [83.9 kg (184 lb 15.5 oz)] 83.9 kg (184 lb 15.5 oz) (03/01 0445) HEMODYNAMICS:   VENTILATOR SETTINGS: Vent Mode:  [-]  FiO2 (%):  [35 %-40 %] 35 %  INTAKE / OUTPUT: Intake/Output     02/28 0701 - 03/01 0700 03/01 0701 - 03/02 0700   I.V. (mL/kg) 180 (2.1)    Other 950    IV Piggyback 450    Total Intake(mL/kg) 1580 (18.8)    Urine (mL/kg/hr) 200 (0.1)    Total Output 200     Net +1380          Urine Occurrence 3 x    Stool Occurrence 3 x     PHYSICAL EXAMINATION:  General:  Alert on WUA Neuro:  nonfocal HEENT:  Trach clean Cardiovascular:  s1 s2 RRR Lungs:  Coarse breath sounds bilaterally. Off vent on  trach collar  Abdomen:  Soft, BS wnl Musculoskeletal:  No edema Skin:  No rash  Imaging: Dg Chest Port 1 View  06/05/2012  *RADIOLOGY REPORT*  Clinical Data: Respiratory failure  PORTABLE CHEST - 1 VIEW  Comparison: Chest radiograph 06/04/2012  Findings: Tracheostomy tube unchanged in position.  Stable cardiac silhouette.  There is left lower lobe opacity representing atelectasis and air space disease.  Right lung relatively clear.  IMPRESSION:  1.  No significant change. 2.  Left lower lobe atelectasis and air space disease.   Original Report Authenticated By: Genevive Bi, M.D.     ASSESSMENT / PLAN:  RESPIRATORY  Recent Labs Lab 06/04/12 0139  PHART 7.485*  PCO2ART 49.3*  PO2ART 75.0*  HCO3 37.1*  O2SAT 96.0   A: Acute resp failure, s/p upsize trach, LLL HCAP P:   - ATC as tolerated since 2/28    CARDIAC A: recent PE/DVT P:  - Coumadin per pharm.  INFECTIOUS DISEASE  Recent Labs Lab 06/01/12 0700 06/02/12 1221 06/04/12 0430 06/05/12 1518 06/06/12 0430  WBC 12.4*  --  12.5*  --  14.0*  PROCALCITON  --  0.11  --  0.15 0.13   A:  HCAP P:   - BAL results  negative. -  narrow to Zosyn 7ds total  RENAL  Recent Labs Lab 06/01/12 0700 06/04/12 0430 06/06/12 0430  BUN 21 14 14   CREATININE 0.62 0.65 0.74   I/O last 3 completed shifts: In: 2542.5 [I.V.:240; Other:1390; IV Piggyback:912.5] Out: 550 [Urine:550] A:  No issues P - Monitor   ELECTROLYTES  Recent Labs Lab 06/01/12 0700 06/04/12 0430 06/05/12 0844 06/06/12 0430  NA 136 137  --  143  K 3.7 3.6  --  2.9*  CL 96 95*  --  101  CO2 34* 35*  --  34*  BUN 21 14  --  14  CREATININE 0.62 0.65  --  0.74  CALCIUM 9.1 8.9  --  9.1  MG  --  1.7 1.7  --   PHOS  --  3.6  --  2.8   A: WNL P: - K+ replaced follow up labs 3-2    HEMATOLOGIC  Recent Labs Lab 06/01/12 0700 06/02/12 0532 06/03/12 0630 06/04/12 0430 06/05/12 0420 06/06/12 0430  HGB 11.3*  --   --  10.1*  --  11.2*   HCT 36.1*  --   --  31.4*  --  35.1*  PLT 223  --   --  257  --  312  INR 2.01* 2.13* 2.55* 2.21* 2.91* 2.47*   A:  Anemia>>stable       PE P:  - Coumadin per pharm  GASTROINTESTINAL  Recent Labs Lab 06/01/12 0700 06/02/12 0532 06/03/12 0630 06/04/12 0430 06/05/12 0420 06/06/12 0430  AST 17  --   --   --   --   --   ALT 27  --   --   --   --   --   ALKPHOS 99  --   --   --   --   --   BILITOT 0.3  --   --   --   --   --   PROT 5.8*  --   --   --   --   --   ALBUMIN 2.1*  --   --   --   --  2.0*  INR 2.01* 2.13* 2.55* 2.21* 2.91* 2.47*   A: Diarrhea X3 P:   - c diff PCR(negative) - on  TF - on H2RB to reduce risk of nosocomial infections (PNA or C diff)  ENDOCRINE  Recent Labs Lab 06/04/12 0008 06/04/12 0418 06/04/12 0748 06/04/12 1151 06/04/12 1531  GLUCAP 97 93 93 87 115*   A:  At risk of Hyperglycemia P:   - Monitor - SSI if hyperglycemia to keep 140 - 180.  NEUROLOGIC A:  No issues P:   - monitor   Global:Transfer to 2600 - unclear dispo plan - ? SNF vs rehab    Cody [name] ACNP Cody [name] PCCM Pager [phone] till 3 pm If no answer page [phone]  Care during the described time interval was provided by me and/or other providers on the critical care team.  I have reviewed this patient's available data, including medical history, events of note, physical examination and test results as part of my evaluation  Cody [name] V.  06/06/2012, 8:11 AM

## 2012-06-06 NOTE — Progress Notes (Signed)
eLink Physician-Brief Progress Note Patient Name: Cody [name] DOB: 07-27-1950 MRN: [id number]  Date of Service  06/06/2012   HPI/Events of Note  Hypokalemia   eICU Interventions  Potassium replaced   Intervention Category Intermediate Interventions: Electrolyte abnormality - evaluation and management  DETERDING,ELIZABETH 06/06/2012, 6:23 AM

## 2012-06-06 NOTE — Progress Notes (Signed)
ANTICOAGULATION CONSULT NOTE - Follow Up Consult  Pharmacy Consult for Warfarin Indication: Hx PE/DVT  Allergies  Allergen Reactions  . Protamine Anaphylaxis    Patient Measurements: Height: 5' 10.08" (178 cm) Weight: 184 lb 15.5 oz (83.9 kg) IBW/kg (Calculated) : 73.18  Vital Signs: Temp: 99 F (37.2 C) (03/01 0756) Temp src: Oral (03/01 0756) BP: 121/80 mmHg (03/01 1000) Pulse Rate: 101 (03/01 1000)  Labs:  Recent Labs  06/04/12 0430 06/05/12 0420 06/06/12 0430  HGB 10.1*  --  11.2*  HCT 31.4*  --  35.1*  PLT 257  --  312  LABPROT 23.6* 28.9* 25.6*  INR 2.21* 2.91* 2.47*  CREATININE 0.65  --  0.74    Estimated Creatinine Clearance: 96.6 ml/min (by C-G formula based on Cr of 0.74).   Medications:  Anti-infectives   Start     Dose/Rate Route Frequency Ordered Stop   06/05/12 1600  vancomycin (VANCOCIN) 500 mg in sodium chloride 0.9 % 100 mL IVPB  Status:  Discontinued     500 mg 100 mL/hr over 60 Minutes Intravenous Every 8 hours 06/05/12 1304 06/05/12 1324   06/05/12 1600  vancomycin (VANCOCIN) 750 mg in sodium chloride 0.9 % 150 mL IVPB  Status:  Discontinued     750 mg 150 mL/hr over 60 Minutes Intravenous Every 8 hours 06/05/12 1324 06/06/12 0820   06/03/12 2100  vancomycin (VANCOCIN) IVPB 1000 mg/200 mL premix  Status:  Discontinued     1,000 mg 200 mL/hr over 60 Minutes Intravenous Every 8 hours 06/03/12 1953 06/05/12 1248   06/03/12 2100  piperacillin-tazobactam (ZOSYN) IVPB 3.375 g     3.375 g 12.5 mL/hr over 240 Minutes Intravenous Every 8 hours 06/03/12 1953        Assessment: 62 y.o. M who continues on warfarin for recent hx PE/DVT (Jan '14). Noted pt with IVC filter. FFP given 2/26 for trach placement. INR today remains therapeutic (INR 2.47 << 2.91, goal of 2-3). Hgb/Hct/Plt stable -- no overt s/sx of bleeding noted at this time.   Goal of Therapy:  INR 2-3   Plan:  1. Warfarin 7.5 mg x 1 dose at 1800 today 2. Will continue to monitor for  any signs/symptoms of bleeding and will follow up with PT/INR in the a.m.   Georgina Pillion, PharmD, BCPS Clinical Pharmacist Pager: [phone] 06/06/2012 10:54 AM

## 2012-06-06 NOTE — Progress Notes (Signed)
Anxiety   Klonopin ordered

## 2012-06-07 ENCOUNTER — Inpatient Hospital Stay (HOSPITAL_COMMUNITY): Payer: Medicaid Other

## 2012-06-07 ENCOUNTER — Encounter (HOSPITAL_COMMUNITY): Payer: Self-pay | Admitting: *Deleted

## 2012-06-07 LAB — CBC
HCT: 33.8 % — ABNORMAL LOW (ref 39.0–52.0)
MCV: 92.6 fL (ref 78.0–100.0)
Platelets: 331 10*3/uL (ref 150–400)
RBC: 3.65 MIL/uL — ABNORMAL LOW (ref 4.22–5.81)
WBC: 13.2 10*3/uL — ABNORMAL HIGH (ref 4.0–10.5)

## 2012-06-07 LAB — RENAL FUNCTION PANEL
Albumin: 1.9 g/dL — ABNORMAL LOW (ref 3.5–5.2)
BUN: 17 mg/dL (ref 6–23)
Chloride: 102 mEq/L (ref 96–112)
Glucose, Bld: 136 mg/dL — ABNORMAL HIGH (ref 70–99)
Potassium: 2.9 mEq/L — ABNORMAL LOW (ref 3.5–5.1)

## 2012-06-07 LAB — PROTIME-INR
INR: 2.79 — ABNORMAL HIGH (ref 0.00–1.49)
Prothrombin Time: 28 seconds — ABNORMAL HIGH (ref 11.6–15.2)

## 2012-06-07 MED ORDER — OXEPA PO LIQD
1000.0000 mL | ORAL | Status: DC
  Filled 2012-06-07 (×2): qty 1000

## 2012-06-07 MED ORDER — ZOLPIDEM TARTRATE 5 MG PO TABS
10.0000 mg | ORAL_TABLET | Freq: Every day | ORAL | Status: DC
  Administered 2012-06-07: 10 mg via ORAL
  Filled 2012-06-07: qty 2

## 2012-06-07 MED ORDER — ZOLPIDEM TARTRATE 5 MG PO TABS
10.0000 mg | ORAL_TABLET | Freq: Once | ORAL | Status: AC
  Administered 2012-06-07: 5 mg via ORAL
  Filled 2012-06-07: qty 2

## 2012-06-07 MED ORDER — POTASSIUM CHLORIDE 20 MEQ/15ML (10%) PO LIQD
40.0000 meq | ORAL | Status: AC
  Administered 2012-06-07 (×3): 40 meq
  Filled 2012-06-07: qty 30

## 2012-06-07 MED ORDER — WARFARIN SODIUM 2.5 MG PO TABS
2.5000 mg | ORAL_TABLET | Freq: Once | ORAL | Status: AC
  Administered 2012-06-07: 2.5 mg via ORAL
  Filled 2012-06-07: qty 1

## 2012-06-07 MED ORDER — OXEPA PO LIQD
1000.0000 mL | ORAL | Status: DC
  Filled 2012-06-07: qty 1000

## 2012-06-07 NOTE — Progress Notes (Signed)
eLink Physician-Brief Progress Note Patient Name: Cody [name] DOB: May 11, 1950 MRN: [id number]  Date of Service  06/07/2012   HPI/Events of Note   Insomnia  eICU Interventions  Order for Ambien 10 mg times one dose   Intervention Category Minor Interventions: Routine modifications to care plan (e.g. PRN medications for pain, fever)  DETERDING,ELIZABETH 06/07/2012, 1:33 AM

## 2012-06-07 NOTE — Progress Notes (Signed)
Name: Cody [name]  MRN: [id number]  DOB: 1950-04-22  ADMISSION DATE: 05/29/2012   CONSULTATION DATE: 2/25 rehab, now admission 2/26  BRIEF PATIENT DESCRIPTION: 62 y/o M with PMH of COPD, GERD, HLD, HTN, CVA admitted 1/5-2/21 with VDRF secondary to influenza A and aspiration PNA. Required trach 1/22. Hospital course complicated by new PE, Bilateral DVT s/p IVC filter 1/27. 2/24 noted to have worsened LLL atx on cxr. 2/25 mild temp (100.4) and worsened cxr. PCCM consulted for concern of LLL PNA.   CULTURES:  2/25 Sputum >> MSSA 2/26 bronch BAL>>> negative  ANTIBIOTICS:  Zosyn 2/25> planned stop 3/4 vanc 2/26>>>3-1  SIGNIFICANT EVENTS / STUDIES:  1/5-2/21 - Admit [facility] for [facility] 2/2 flu, asp PNA s/p trach  2/26 retrach, bronch, distress, vent needed 2/27 off full support. ATC 2/28 back on to full support 2/28  Back to ATC  SUBJECTIVE:   Awake and alert on t collar Able to lie supine - no resp distress  VITAL SIGNS: Temp:  [98.5 F (36.9 C)-99.1 F (37.3 C)] 98.5 F (36.9 C) (03/02 1200) Pulse Rate:  [89-109] 93 (03/02 1200) Resp:  [18-29] 28 (03/02 1200) BP: (115-148)/(62-92) 145/92 mmHg (03/02 1200) SpO2:  [95 %-100 %] 95 % (03/02 1200) FiO2 (%):  [35 %] 35 % (03/02 1200) Weight:  [185 lb 6.5 oz (84.1 kg)] 185 lb 6.5 oz (84.1 kg) (03/02 0327)  INTAKE / OUTPUT: Intake/Output     03/01 0701 - 03/02 0700 03/02 0701 - 03/03 0700   I.V. (mL/kg) 290 (3.4) 60 (0.7)   Other 1100 300   NG/GT 200    IV Piggyback 100 100   Total Intake(mL/kg) 1690 (20.1) 460 (5.5)   Urine (mL/kg/hr) 475 (0.2) 200 (0.3)   Stool 4 (0)    Total Output 479 200   Net +1211 +260        Urine Occurrence 5 x 2 x   Stool Occurrence 3 x 2 x    PHYSICAL EXAMINATION:  General:  Alert on WUA Neuro:  nonfocal HEENT:  Trach clean Cardiovascular:  s1 s2 RRR Lungs:  Coarse breath rhonchi bilaterally. Off vent on trach collar  Abdomen:  Soft, BS wnl Musculoskeletal:  No edema Skin:  No  rash  Imaging: Dg Chest Port 1 View  06/07/2012  *RADIOLOGY REPORT*  Clinical Data: Tracheostomy.  PORTABLE CHEST - 1 VIEW  Comparison: 06/05/2012  Findings: Tracheostomy tube is unchanged.  Worsening aeration of the left lung with increasing left lung airspace disease.  Moderate left effusion, also likely increased.  Minimal right base atelectasis or scarring.  Heart is upper limits normal in size.  IMPRESSION: Worsening left lung airspace disease and left effusion.   Original Report Authenticated By: Charlett Nose, M.D.     ASSESSMENT / PLAN:  RESPIRATORY  Recent Labs Lab 06/04/12 0139  PHART 7.485*  PCO2ART 49.3*  PO2ART 75.0*  HCO3 37.1*  O2SAT 96.0   A: Acute resp failure, s/p upsize trach, LLL HCAP P:   - ATC as tolerated since 2/28 but aeration L base worse ? Needs thoracentesis? > consider 3/3    CARDIAC A: recent PE/DVT P:  - Coumadin per pharm.  INFECTIOUS DISEASE  Recent Labs Lab 06/01/12 0700 06/02/12 1221 06/04/12 0430 06/05/12 1518 06/06/12 0430 06/07/12 0500  WBC 12.4*  --  12.5*  --  14.0* 13.2*  PROCALCITON  --  0.11  --  0.15 0.13 0.16   A:  HCAP P:   - see micro  flowsheet on dashboard  RENAL  Recent Labs Lab 06/01/12 0700 06/04/12 0430 06/06/12 0430 06/07/12 0500  BUN 21 14 14 17   CREATININE 0.62 0.65 0.74 0.70   I/O last 3 completed shifts: In: 2620 [I.V.:470; Other:1600; NG/GT:200; IV Piggyback:350] Out: 479 [Urine:475; Stool:4] A:  No issues P - Monitor   ELECTROLYTES  Recent Labs Lab 06/01/12 0700 06/04/12 0430 06/05/12 0844 06/06/12 0430 06/07/12 0500  NA 136 137  --  143 144  K 3.7 3.6  --  2.9* 2.9*  CL 96 95*  --  101 102  CO2 34* 35*  --  34* 33*  BUN 21 14  --  14 17  CREATININE 0.62 0.65  --  0.74 0.70  CALCIUM 9.1 8.9  --  9.1 9.1  MG  --  1.7 1.7  --  1.9  PHOS  --  3.6  --  2.8 3.0   A: WNL P: - K+ replaced follow up labs 3-2    HEMATOLOGIC  Recent Labs Lab 06/01/12 0700  06/03/12 0630  06/04/12 0430 06/05/12 0420 06/06/12 0430 06/07/12 0500  HGB 11.3*  --   --  10.1*  --  11.2* 10.6*  HCT 36.1*  --   --  31.4*  --  35.1* 33.8*  PLT 223  --   --  257  --  312 331  INR 2.01*  < > 2.55* 2.21* 2.91* 2.47* 2.79*  < > = values in this interval not displayed. A:  Anemia>>stable       PE P:  - Coumadin per pharm  GASTROINTESTINAL  Recent Labs Lab 06/01/12 0700  06/03/12 0630 06/04/12 0430 06/05/12 0420 06/06/12 0430 06/07/12 0500  AST 17  --   --   --   --   --   --   ALT 27  --   --   --   --   --   --   ALKPHOS 99  --   --   --   --   --   --   BILITOT 0.3  --   --   --   --   --   --   PROT 5.8*  --   --   --   --   --   --   ALBUMIN 2.1*  --   --   --   --  2.0* 1.9*  INR 2.01*  < > 2.55* 2.21* 2.91* 2.47* 2.79*  < > = values in this interval not displayed. A: Diarrhea X3 P:   - c diff PCR(negative) - on  TF - on H2RB to reduce risk of nosocomial infections (PNA or C diff)  ENDOCRINE  Recent Labs Lab 06/04/12 0008 06/04/12 0418 06/04/12 0748 06/04/12 1151 06/04/12 1531  GLUCAP 97 93 93 87 115*   A:  At risk of Hyperglycemia P:   - Monitor - SSI if hyperglycemia to keep 140 - 180.  NEUROLOGIC A:  No issues P:   - monitor   Global:  unclear dispo plan - ? SNF vs rehab  Sandrea Hughs, MD Pulmonary and Critical Care Medicine [facility] Cell [phone] After 5:30 PM or weekends, call [phone]

## 2012-06-07 NOTE — Progress Notes (Signed)
ANTICOAGULATION CONSULT NOTE - Follow Up Consult  Pharmacy Consult for warfarin Indication: Hx PE/DVT  Allergies  Allergen Reactions  . Protamine Anaphylaxis    Patient Measurements: Height: 5' 10.08" (178 cm) Weight: 185 lb 6.5 oz (84.1 kg) IBW/kg (Calculated) : 73.18   Vital Signs: Temp: 98.7 F (37.1 C) (03/02 0800) Temp src: Oral (03/02 0800) BP: 125/74 mmHg (03/02 0800) Pulse Rate: 103 (03/02 0800)  Labs:  Recent Labs  06/05/12 0420 06/06/12 0430 06/07/12 0500  HGB  --  11.2* 10.6*  HCT  --  35.1* 33.8*  PLT  --  312 331  LABPROT 28.9* 25.6* 28.0*  INR 2.91* 2.47* 2.79*  CREATININE  --  0.74 0.70    Estimated Creatinine Clearance: 96.6 ml/min (by C-G formula based on Cr of 0.7).   Medications:  Scheduled:  . antiseptic oral rinse  1 application Mouth Rinse QID  . chlorhexidine  15 mL Mouth/Throat BID  . famotidine  20 mg Per Tube BID  . feeding supplement  30 mL Per Tube QID  . free water  100 mL Per Tube Q8H  . piperacillin-tazobactam (ZOSYN)  IV  3.375 g Intravenous Q8H  . potassium chloride  40 mEq Per Tube Q1 Hr x 3  . [COMPLETED] warfarin  7.5 mg Oral ONCE-1800  . Warfarin - Pharmacist Dosing Inpatient   Does not apply q1800  . [COMPLETED] zolpidem  10 mg Oral Once    Assessment: 62 y.o. male on warfarin for recent hx PE/DVT (Jan '14). Noted pt with IVC filter. FFP given 2/26 for trach placement. INR today remains therapeutic but increase from 2.4 to 2.8 today ( goal INR  of 2-3), will decrease dose for tonight. Plt stable, Hbg slight decrease. No overt s/sx of bleeding noted.  Goal of Therapy:  INR 2-3 Monitor platelets by anticoagulation protocol: Yes   Plan:  1.Warfarin 2.5 mg x 1 2.F/u INR in AM 3. Monitor CBC and s/sx of bleeding  Bola A. Wandra Feinstein D Clinical Pharmacist [phone] Phone [phone] 06/07/2012 9:05 AM

## 2012-06-07 NOTE — Evaluation (Signed)
Physical Therapy Evaluation Patient Details Name: Cody [name] MRN: [id number] DOB: 15-Jun-1950 Today's Date: 06/07/2012 Time: 1191-4782 PT Time Calculation (min): 28 min  PT Assessment / Plan / Recommendation Clinical Impression  Patient is a 62 yo male readmitted to acute hospital from CIR with HCAP, resp failure/intubated.  Patient with trach - able to communicate in written form.  Patient with DOE of 4/4 with gait - was able to ambulate 9' with 2 rest breaks.  Will benefit from acute PT to maximize independence prior to discharge.  Recommend patient return to Inpatient Rehab for continued therapy.    PT Assessment  Patient needs continued PT services    Follow Up Recommendations  CIR    Does the patient have the potential to tolerate intense rehabilitation      Barriers to Discharge        Equipment Recommendations  Rolling walker with 5" wheels    Recommendations for Other Services Rehab consult   Frequency Min 3X/week    Precautions / Restrictions Precautions Precautions: Fall Precaution Comments: trach collar, PEG Restrictions Weight Bearing Restrictions: No   Pertinent Vitals/Pain       Mobility  Bed Mobility Bed Mobility: Rolling Left;Left Sidelying to Sit;Sit to Sidelying Left Rolling Left: 4: Min guard;With rail Left Sidelying to Sit: 4: Min guard;With rails;HOB elevated Sit to Sidelying Left: 4: Min guard;With rail;HOB elevated Details for Bed Mobility Assistance: Verbal cues for sequencing/technique.  Assist for safety. Transfers Transfers: Sit to Stand;Stand to Sit Sit to Stand: 4: Min assist;With upper extremity assist;From bed;With armrests;From chair/3-in-1 Stand to Sit: 4: Min assist;With upper extremity assist;With armrests;To chair/3-in-1;To bed Details for Transfer Assistance: Verbal cues for hand placement.  Assist for balance/safety. Ambulation/Gait Ambulation/Gait Assistance: 4: Min assist (+1 to bring chair behind patient for  safety) Ambulation Distance (Feet): 60 Feet (with 2 sitting rest breaks) Assistive device: Rolling walker Ambulation/Gait Assistance Details: Verbal cues to stand upright, stay close to RW.  Assist during turns with RW, with cues to keep feet inside RW.  Patient with loss of balance x2 requiring assist to regain balance. Gait Pattern: Step-through pattern;Decreased stride length;Shuffle;Trunk flexed;Narrow base of support Gait velocity: Slow gait speed    Exercises     PT Diagnosis: Difficulty walking;Abnormality of gait;Generalized weakness;Altered mental status  PT Problem List: Decreased strength;Decreased activity tolerance;Decreased balance;Decreased mobility;Decreased cognition;Decreased knowledge of use of DME;Decreased safety awareness;Cardiopulmonary status limiting activity PT Treatment Interventions: DME instruction;Gait training;Functional mobility training;Balance training;Cognitive remediation;Patient/family education   PT Goals Acute Rehab PT Goals PT Goal Formulation: With patient Time For Goal Achievement: 06/14/12 Potential to Achieve Goals: Good Pt will Roll Supine to Right Side: with supervision PT Goal: Rolling Supine to Right Side - Progress: Goal set today Pt will Roll Supine to Left Side: with supervision PT Goal: Rolling Supine to Left Side - Progress: Goal set today Pt will go Supine/Side to Sit: with supervision;with HOB 0 degrees PT Goal: Supine/Side to Sit - Progress: Goal set today Pt will go Sit to Stand: with supervision;with upper extremity assist PT Goal: Sit to Stand - Progress: Goal set today Pt will go Stand to Sit: with supervision;with upper extremity assist PT Goal: Stand to Sit - Progress: Goal set today Pt will Ambulate: 51 - 150 feet;with supervision;with rolling walker PT Goal: Ambulate - Progress: Goal set today  Visit Information  Last PT Received On: 06/07/12 Assistance Needed: +2    Subjective Data  Subjective: Patient wrote "I  haven't been to the gym (on rehab)  in 6 days"  Reminded patient that he is off of CIR now and on acute due to pna/VDRF. Patient Stated Goal: To walk   Prior Functioning  Home Living Lives With: Spouse Available Help at Discharge: Family Type of Home: Mobile home Home Access: Stairs to enter Secretary/administrator of Steps: 3 Entrance Stairs-Rails: Right;Left Home Layout: One level Bathroom Shower/Tub: Health visitor: Standard Bathroom Accessibility: Yes How Accessible: Accessible via walker Home Adaptive Equipment: Built-in shower seat Prior Function Level of Independence: Independent Able to Take Stairs?: Yes Driving: Yes Vocation: Full time employment Comments: Patient had been on Inpatient Rehab and was readmitted to hospital on 06/03/12 with HCAP, VDRF. Communication Communication: Tracheostomy Dominant Hand: Right    Cognition  Cognition Overall Cognitive Status: Impaired Area of Impairment: Attention;Memory;Safety/judgement;Problem solving;Awareness of deficits Difficult to assess due to: Tracheostomy Arousal/Alertness: Awake/alert Orientation Level: Disoriented to;Place;Time Behavior During Session: Restless Current Attention Level: Selective Following Commands: Follows one step commands consistently Safety/Judgement: Impulsive Safety/Judgement - Other Comments: Initiates standing prior to lines being prepared.   Awareness of Deficits: Reminders that he needs to wait for assistance before moving    Extremity/Trunk Assessment Right Upper Extremity Assessment RUE ROM/Strength/Tone: [facility] for tasks assessed Left Upper Extremity Assessment LUE ROM/Strength/Tone: [facility] for tasks assessed Right Lower Extremity Assessment RLE ROM/Strength/Tone: Deficits RLE ROM/Strength/Tone Deficits: Strength grossly 4/5 Left Lower Extremity Assessment LLE ROM/Strength/Tone: Deficits LLE ROM/Strength/Tone Deficits: Strength grossly 4/5   Balance Balance Balance  Assessed: Yes Static Sitting Balance Static Sitting - Balance Support: No upper extremity supported;Feet supported Static Sitting - Level of Assistance: 5: Stand by assistance Static Sitting - Comment/# of Minutes: 6 Static Standing Balance Static Standing - Balance Support: Bilateral upper extremity supported Static Standing - Level of Assistance: 4: Min assist Static Standing - Comment/# of Minutes: 2  End of Session PT - End of Session Equipment Utilized During Treatment: Gait belt;Oxygen Activity Tolerance: Patient limited by fatigue;Treatment limited secondary to medical complications (Comment) (DOE 4/4) Patient left: in bed;with call bell/phone within reach;with nursing in room Nurse Communication: Mobility status  GP     Vena Austria 06/07/2012, 2:37 PM Durenda Hurt. Renaldo Fiddler, [facility] Acute Rehab Services Pager [phone]

## 2012-06-07 NOTE — Progress Notes (Signed)
Paged Dr. Sherene Sires regarding pt requesting QHS ambien.  Orders received.  Pt also has been having loose stools, frequent; last BM looked very similar to TF that is infusing @ 62mL/hr.  Orders received to turn down TF rate to 51mL/hr.  Will continue to monitor.  Salomon Mast, RN

## 2012-06-07 NOTE — Progress Notes (Signed)
eLink Physician-Brief Progress Note Patient Name: Cody [name] DOB: 1950/07/29 MRN: [id number]  Date of Service  06/07/2012   HPI/Events of Note  Hypokalemia   eICU Interventions  Potassium replaced   Intervention Category Intermediate Interventions: Electrolyte abnormality - evaluation and management  DETERDING,ELIZABETH 06/07/2012, 6:57 AM

## 2012-06-08 ENCOUNTER — Inpatient Hospital Stay (HOSPITAL_COMMUNITY): Payer: Medicaid Other

## 2012-06-08 DIAGNOSIS — K9420 Gastrostomy complication, unspecified: Secondary | ICD-10-CM

## 2012-06-08 DIAGNOSIS — R1310 Dysphagia, unspecified: Secondary | ICD-10-CM

## 2012-06-08 DIAGNOSIS — E870 Hyperosmolality and hypernatremia: Secondary | ICD-10-CM

## 2012-06-08 LAB — RENAL FUNCTION PANEL
CO2: 37 mEq/L — ABNORMAL HIGH (ref 19–32)
Chloride: 110 mEq/L (ref 96–112)
GFR calc Af Amer: >90 mL/min (ref >90)
GFR calc non Af Amer: >90 mL/min (ref >90)
Glucose, Bld: 111 mg/dL — ABNORMAL HIGH (ref 70–99)
Potassium: 4.3 mEq/L (ref 3.5–5.1)
Sodium: 154 mEq/L — ABNORMAL HIGH (ref 135–145)

## 2012-06-08 LAB — CBC
Hemoglobin: 11.1 g/dL — ABNORMAL LOW (ref 13.0–17.0)
MCH: 28.7 pg (ref 26.0–34.0)
Platelets: 366 10*3/uL (ref 150–400)
RBC: 3.87 MIL/uL — ABNORMAL LOW (ref 4.22–5.81)
WBC: 12.5 10*3/uL — ABNORMAL HIGH (ref 4.0–10.5)

## 2012-06-08 LAB — PROTIME-INR
INR: 2.96 — ABNORMAL HIGH (ref 0.00–1.49)
Prothrombin Time: 29.3 seconds — ABNORMAL HIGH (ref 11.6–15.2)

## 2012-06-08 MED ORDER — FREE WATER: 240.0000 mL | Freq: Three times a day (TID) | Status: DC

## 2012-06-08 MED ORDER — LORAZEPAM 2 MG/ML IJ SOLN: 0.5000 mg | INTRAMUSCULAR | Status: DC | PRN

## 2012-06-08 MED ORDER — ZOLPIDEM TARTRATE 5 MG PO TABS
10.0000 mg | ORAL_TABLET | Freq: Every evening | ORAL | Status: DC | PRN
  Administered 2012-06-08: 10 mg via ORAL
  Filled 2012-06-08: qty 2

## 2012-06-08 MED ORDER — WARFARIN SODIUM 2.5 MG PO TABS
2.5000 mg | ORAL_TABLET | Freq: Once | ORAL | Status: AC
  Administered 2012-06-08: 2.5 mg via ORAL
  Filled 2012-06-08: qty 1

## 2012-06-08 MED ORDER — FENTANYL CITRATE 0.05 MG/ML IJ SOLN
25.0000 ug | INTRAMUSCULAR | Status: DC | PRN
  Administered 2012-06-13 – 2012-06-14 (×2): 50 ug via INTRAVENOUS
  Filled 2012-06-08 (×3): qty 2

## 2012-06-08 MED ORDER — DEXTROSE 5 % IV SOLN
INTRAVENOUS | Status: DC
  Administered 2012-06-08 – 2012-06-16 (×7): via INTRAVENOUS

## 2012-06-08 NOTE — Progress Notes (Signed)
ANTICOAGULATION CONSULT NOTE - Follow Up Consult  Pharmacy Consult for warfarin Indication: Hx PE/DVT  Allergies  Allergen Reactions  . Protamine Anaphylaxis    Patient Measurements: Height: 5' 10.08" (178 cm) Weight: 178 lb 9.2 oz (81 kg) IBW/kg (Calculated) : 73.18   Vital Signs: Temp: 98 F (36.7 C) (03/03 0748) Temp src: Oral (03/03 0748) BP: 137/66 mmHg (03/03 0800) Pulse Rate: 89 (03/03 0800)  Labs:  Recent Labs  06/06/12 0430 06/07/12 0500 06/08/12 0700  HGB 11.2* 10.6* 11.1*  HCT 35.1* 33.8* 36.9*  PLT 312 331 366  LABPROT 25.6* 28.0* 29.3*  INR 2.47* 2.79* 2.96*  CREATININE 0.74 0.70 0.71    Estimated Creatinine Clearance: 96.6 ml/min (by C-G formula based on Cr of 0.71).   Medications:  Scheduled:  . antiseptic oral rinse  1 application Mouth Rinse QID  . chlorhexidine  15 mL Mouth/Throat BID  . famotidine  20 mg Per Tube BID  . feeding supplement (OXEPA)  1,000 mL Per Tube Q24H  . feeding supplement  30 mL Per Tube QID  . free water  240 mL Per Tube Q8H  . piperacillin-tazobactam (ZOSYN)  IV  3.375 g Intravenous Q8H  . [COMPLETED] potassium chloride  40 mEq Per Tube Q1 Hr x 3  . [COMPLETED] warfarin  2.5 mg Oral ONCE-1800  . Warfarin - Pharmacist Dosing Inpatient   Does not apply q1800  . zolpidem  10 mg Oral QHS  . [DISCONTINUED] free water  100 mL Per Tube Q8H    Assessment: 62 y.o. male on warfarin for recent hx PE/DVT (Jan '14). Noted pt with IVC filter. FFP given 2/26 for trach placement. INR today remains therapeutic but increasing slowly 2.47>2.79>2.96 ( goal INR  of 2-3).  Goal of Therapy:  INR 2-3 Monitor platelets by anticoagulation protocol: Yes   Plan:  1.Warfarin 2.5 mg x 1 2.F/u INR in AM  Harland German, Pharm D 06/08/2012 9:08 AM

## 2012-06-08 NOTE — Progress Notes (Signed)
CIR Admissions: Received request from CSW to evaluate patient for re-admit to inpatient rehab. Will consult with Rehab MD and follow-up in the morning. For questions call [phone]

## 2012-06-08 NOTE — Progress Notes (Signed)
Patient ID: Cody [name], male   DOB: Sep 18, 1950, 62 y.o.   MRN: [id number]    Subjective: Pt known to [place] as he has chronic VDRF and had a trach placed.  He needed feeding access and we placed a PEG tube by Dr. Ezzard Standing on 05-19-12.  Apparently he pulled this out about 7 hrs ago.  A tube was note replaced to keep the tract.  He has a swallow study today, but was initially asked to see if I could come put something back in the tract in case he didn't pass his swallow study.  Objective: Vital signs in last 24 hours: Temp:  [97.9 F (36.6 C)-98.9 F (37.2 C)] 98.5 F (36.9 C) (03/03 1143) Pulse Rate:  [76-130] 110 (03/03 1143) Resp:  [16-28] 18 (03/03 1143) BP: (134-148)/(66-92) 148/91 mmHg (03/03 1143) SpO2:  [95 %-100 %] 95 % (03/03 1143) FiO2 (%):  [35 %] 35 % (03/03 1143) Weight:  [178 lb 9.2 oz (81 kg)] 178 lb 9.2 oz (81 kg) (03/03 0500) Last BM Date: 06/08/12  Intake/Output from previous day: 03/02 0701 - 03/03 0700 In: 1457.5 [I.V.:230; NG/GT:240; IV Piggyback:200] Out: 200 [Urine:200] Intake/Output this shift: Total I/O In: 20 [I.V.:20] Out: 202 [Urine:200; Stool:2]  PE: Abd: soft, an 75F foley was used to attempt to replace in his tract.  I was unable to do so and I caused significant pain at the same time.  Lab Results:   Recent Labs  06/07/12 0500 06/08/12 0700  WBC 13.2* 12.5*  HGB 10.6* 11.1*  HCT 33.8* 36.9*  PLT 331 366   BMET  Recent Labs  06/07/12 0500 06/08/12 0700  NA 144 154*  K 2.9* 4.3  CL 102 110  CO2 33* 37*  GLUCOSE 136* 111*  BUN 17 14  CREATININE 0.70 0.71  CALCIUM 9.1 9.9   PT/INR  Recent Labs  06/07/12 0500 06/08/12 0700  LABPROT 28.0* 29.3*  INR 2.79* 2.96*   CMP     Component Value Date/Time   NA 154* 06/08/2012 0700   K 4.3 06/08/2012 0700   CL 110 06/08/2012 0700   CO2 37* 06/08/2012 0700   GLUCOSE 111* 06/08/2012 0700   BUN 14 06/08/2012 0700   CREATININE 0.71 06/08/2012 0700   CALCIUM 9.9 06/08/2012 0700   PROT 5.8* 06/01/2012  0700   ALBUMIN 2.2* 06/08/2012 0700   AST 17 06/01/2012 0700   ALT 27 06/01/2012 0700   ALKPHOS 99 06/01/2012 0700   BILITOT 0.3 06/01/2012 0700   GFRNONAA >90 06/08/2012 0700   GFRAA >90 06/08/2012 0700   Lipase     Component Value Date/Time   LIPASE 10* 05/20/2012 0138       Studies/Results: Dg Chest Port 1 View  06/07/2012  *RADIOLOGY REPORT*  Clinical Data: Tracheostomy.  PORTABLE CHEST - 1 VIEW  Comparison: 06/05/2012  Findings: Tracheostomy tube is unchanged.  Worsening aeration of the left lung with increasing left lung airspace disease.  Moderate left effusion, also likely increased.  Minimal right base atelectasis or scarring.  Heart is upper limits normal in size.  IMPRESSION: Worsening left lung airspace disease and left effusion.   Original Report Authenticated By: Charlett Nose, M.D.     Anti-infectives: Anti-infectives   Start     Dose/Rate Route Frequency Ordered Stop   06/05/12 1600  vancomycin (VANCOCIN) 500 mg in sodium chloride 0.9 % 100 mL IVPB  Status:  Discontinued     500 mg 100 mL/hr over 60 Minutes Intravenous Every 8  hours 06/05/12 1304 06/05/12 1324   06/05/12 1600  vancomycin (VANCOCIN) 750 mg in sodium chloride 0.9 % 150 mL IVPB  Status:  Discontinued     750 mg 150 mL/hr over 60 Minutes Intravenous Every 8 hours 06/05/12 1324 06/06/12 0820   06/03/12 2100  vancomycin (VANCOCIN) IVPB 1000 mg/200 mL premix  Status:  Discontinued     1,000 mg 200 mL/hr over 60 Minutes Intravenous Every 8 hours 06/03/12 1953 06/05/12 1248   06/03/12 2100  piperacillin-tazobactam (ZOSYN) IVPB 3.375 g     3.375 g 12.5 mL/hr over 240 Minutes Intravenous Every 8 hours 06/03/12 1953         Assessment/Plan  1. Dislodged/removed PEG 2. Dysphagia  Plan: 1. Spoke with Dr. Bard Herbert, he suspects patient will pass his swallow study today.  He does not want PEG replaced.  He states that if he doesn't pass then he will put in a small bore PANDA.  No further needs associated with PANDA.   Dry dressing over hole as needed.  Please call us back if needed.   LOS: 5 days    OSBORNE,KELLY E 06/08/2012, 11:50 AM Pager: [phone]

## 2012-06-08 NOTE — Progress Notes (Signed)
Name: Cody [name]  MRN: [id number]  DOB: 1950/07/14  ADMISSION DATE: 05/29/2012   CONSULTATION DATE: 2/25 rehab, now admission 2/26  BRIEF PATIENT DESCRIPTION: 62 y/o M with PMH of COPD, GERD, HLD, HTN, CVA admitted 1/5-2/21 with VDRF secondary to influenza A and aspiration PNA. Required trach 1/22. Hospital course complicated by new PE, Bilateral DVT s/p IVC filter 1/27. 2/24 noted to have worsened LLL atx on cxr. 2/25 mild temp (100.4) and worsened cxr. PCCM consulted for concern of LLL PNA.   CULTURES:  2/25 Sputum >> MSSA 2/26 bronch BAL>>> negative Blood 2/27 >> neg 2/28 C diff >> NEG  ANTIBIOTICS:  Zosyn 2/25> planned stop 3/4 vanc 2/26 >>3/1  SIGNIFICANT EVENTS / STUDIES:  1/5-2/21 - Admit to [facility] for VDRF 2/2 flu, asp PNA s/p trach  2/26 retrach, bronch, distress, vent needed 2/27 off full support. ATC 2/28 back on to full support 2/28  Back to ATC 3/3: PEG pulled out   SUBJECTIVE:   Awake and alert on t collar Able to lie supine - no resp distress  VITAL SIGNS: Temp:  [97.9 F (36.6 C)-98.9 F (37.2 C)] 98 F (36.7 C) (03/03 0748) Pulse Rate:  [76-98] 89 (03/03 0800) Resp:  [16-28] 19 (03/03 0800) BP: (134-148)/(66-92) 137/66 mmHg (03/03 0800) SpO2:  [95 %-100 %] 99 % (03/03 0800) FiO2 (%):  [35 %] 35 % (03/03 0800) Weight:  [81 kg (178 lb 9.2 oz)] 81 kg (178 lb 9.2 oz) (03/03 0500)  INTAKE / OUTPUT: Intake/Output     03/02 0701 - 03/03 0700 03/03 0701 - 03/04 0700   I.V. (mL/kg) 230 (2.8) 10 (0.1)   Other 787.5    NG/GT 240    IV Piggyback 200    Total Intake(mL/kg) 1457.5 (18) 10 (0.1)   Urine (mL/kg/hr) 200 (0.1)    Stool     Total Output 200     Net +1257.5 +10        Urine Occurrence 10 x    Stool Occurrence 11 x     PHYSICAL EXAMINATION:  General:  Alert on WUA Neuro:  nonfocal HEENT:  Trach clean, weak phonation with PMV Cardiovascular:  s1 s2 RRR Lungs:  Coarse breath rhonchi bilaterally. Off vent on trach collar  Abdomen:   Soft, BS wnl Musculoskeletal:  No edema Skin:  No rash  Recent Labs Lab 06/06/12 0430 06/07/12 0500 06/08/12 0700  NA 143 144 154*  K 2.9* 2.9* 4.3  CL 101 102 110  CO2 34* 33* 37*  BUN 14 17 14   CREATININE 0.74 0.70 0.71  GLUCOSE 136* 136* 111*    Recent Labs Lab 06/06/12 0430 06/07/12 0500 06/08/12 0700  HGB 11.2* 10.6* 11.1*  HCT 35.1* 33.8* 36.9*  WBC 14.0* 13.2* 12.5*  PLT 312 331 366   Lab Results  Component Value Date   INR 2.96* 06/08/2012   INR 2.79* 06/07/2012   INR 2.47* 06/06/2012   Imaging: Dg Chest Port 1 View  06/07/2012  *RADIOLOGY REPORT*  Clinical Data: Tracheostomy.  PORTABLE CHEST - 1 VIEW  Comparison: 06/05/2012  Findings: Tracheostomy tube is unchanged.  Worsening aeration of the left lung with increasing left lung airspace disease.  Moderate left effusion, also likely increased.  Minimal right base atelectasis or scarring.  Heart is upper limits normal in size.  IMPRESSION: Worsening left lung airspace disease and left effusion.   Original Report Authenticated By: Charlett Nose, M.D.   Agree, increased Left sided airspace disease   ASSESSMENT /  PLAN:  Acute respiratory failure: secondary to influenza A and aspiration PNA. PE: Nosocomial  Failure to wean: has persistent left sided airspace disease c/w PNA, ATX and some element of effusion.  s/p upsize trach LLL HCAP Increased L pleural effusion 3/2 P:   - ATC as tolerated since 2/28-->change to cuffless # 6 - PMV trials  - see flowsheet re: abx - repeat CXR PRN - cont coumadin, unless sig effusion  - [place] left chest, if sig effusion--> hold coumadin and thora when INR <2  Hypernatremia P: Free water adjusted   Anemia>>stable PE  Recent Labs Lab 06/04/12 0430 06/05/12 0420 06/06/12 0430 06/07/12 0500 06/08/12 0700  HGB 10.1*  --  11.2* 10.6* 11.1*  HCT 31.4*  --  35.1* 33.8* 36.9*  PLT 257  --  312 331 366  INR 2.21* 2.91* 2.47* 2.79* 2.96*   P:  - Coumadin per pharm  Dysphagia   Diarrhea X3 - c diff PCR(negative)  Recent Labs Lab 06/04/12 0430 06/05/12 0420 06/06/12 0430 06/07/12 0500 06/08/12 0700  ALBUMIN  --   --  2.0* 1.9* 2.2*  INR 2.21* 2.91* 2.47* 2.79* 2.96*  P: - swallow eval  - No indication for SUP. H2RB D/C'd  PEG dislodgement Looks like it was placed in Jan 2014 P: Asked surg to eval. (placed by Ezzard Standing) Looks like stoma closed so will likely need re-do  If can't be done today will need NGT   Global:  unclear dispo plan - ? SNF vs rehab    I have interviewed and examined the patient and reviewed the database. I have formulated the assessment and plan as reflected in the note above with amendments made by me.   His cognition is much improved compared to last week. I think he is likely to be able to take PO nutrition. Will not have G tube replaced unless he does poorly on SLP eval. He is ready to go back to PMR if still qualifies  Will F/U L pleural effusion with CXR 3/4 and consider thoracentesis   D5W ordered for hypernatremia  Transfer to tele  Billy Fischer, MD;  PCCM service; Mobile [phone]

## 2012-06-08 NOTE — Progress Notes (Signed)
Physical Therapy Treatment Patient Details Name: Cody [name] MRN: [id number] DOB: Nov 26, 1950 Today's Date: 06/08/2012 Time: 9147-8295 PT Time Calculation (min): 29 min  PT Assessment / Plan / Recommendation Comments on Treatment Session  Pt admitted for PNA, VDRF, trach and bronch. Pt progressing well with therapy today able to ambulate further and progress activity. Pt unable to stay up in chair end of session greater than despite back pain and offers to adjust seating position and despite education for benefits of being upright pt stated he would sit on the side of bed at least a few more times today.     Follow Up Recommendations        Does the patient have the potential to tolerate intense rehabilitation     Barriers to Discharge        Equipment Recommendations       Recommendations for Other Services    Frequency     Plan Discharge plan remains appropriate;Frequency remains appropriate    Precautions / Restrictions Precautions Precautions: Fall Precaution Comments: trach, PEG Restrictions Weight Bearing Restrictions: No   Pertinent Vitals/Pain Pain in left hip with mobility and sitting, repositioned HR up to 130 with ambulation back to 110 at rest, sats 90-95% on 35% trach collar    Mobility  Bed Mobility Supine to Sit: 5: Supervision;HOB flat Sitting - Scoot to Edge of Bed: 5: Supervision Details for Bed Mobility Assistance: supervision for lines and safety Transfers Sit to Stand: 4: Min guard;From bed;From chair/3-in-1 Stand to Sit: 4: Min guard;To chair/3-in-1 Details for Transfer Assistance: cueing for hand placement and safety and management of lines Ambulation/Gait Ambulation/Gait Assistance: 4: Min guard Ambulation Distance (Feet): 90 Feet (70', 90', 80', 80' over 4 trials with 3 seated rests) Assistive device: Rolling walker Ambulation/Gait Assistance Details: cues to step into RW and to widen base of support  Gait Pattern: Step-through  pattern;Shuffle;Narrow base of support Gait velocity: decreased    Exercises General Exercises - Lower Extremity Long Arc Quad: AROM;Both;10 reps;Seated Hip Flexion/Marching: AROM;Both;20 reps;Seated   PT Diagnosis:    PT Problem List:   PT Treatment Interventions:     PT Goals Acute Rehab PT Goals Pt will Roll Supine to Right Side: with modified independence PT Goal: Rolling Supine to Right Side - Progress: Updated due to goal met PT Goal: Sit to Stand - Progress: Progressing toward goal PT Goal: Stand to Sit - Progress: Progressing toward goal Pt will Transfer Bed to Chair/Chair to Bed: with modified independence PT Transfer Goal: Bed to Chair/Chair to Bed - Progress: Updated due to goal met Pt will Stand: with modified independence;3 - 5 min;with bilateral upper extremity support PT Goal: Stand - Progress: Goal set today Pt will Ambulate: >150 feet;with rolling walker;with supervision PT Goal: Ambulate - Progress: Updated due to goal met PT Goal: Perform Home Exercise Program - Progress: Progressing toward goal  Visit Information  Last PT Received On: 06/08/12 Assistance Needed: +2 (+2 for chair to follow)    Subjective Data  Subjective: I'm a little hungry   Cognition  Cognition Arousal/Alertness: Awake/alert Orientation Level: Appears intact for tasks assessed Behavior During Session: [facility] for tasks performed Following Commands: Follows one step commands consistently    Balance     End of Session PT - End of Session Equipment Utilized During Treatment: Gait belt;Oxygen Activity Tolerance: Patient tolerated treatment well Patient left: in chair;with call bell/phone within reach Nurse Communication: Mobility status   GP     Delorse Lek  06/08/2012, 9:52 AM Delaney Meigs, PT [phone]

## 2012-06-08 NOTE — Progress Notes (Signed)
Pt's heartrate is dropping as low as 38, but is nonsustained.  Pt is sleeping each time he is assessed. Instantly arousable and denies any cp, sob. Dr. Darrick Penna notified of same. Will continue to monitor.

## 2012-06-08 NOTE — Progress Notes (Signed)
Tolerated well Sergio Hobart J. Lelan Cush, MD, FACP Pgr: [phone] [facility]  

## 2012-06-08 NOTE — Progress Notes (Signed)
When pt was getting oob to bsc he accidentally pulled his PEG tube out.  Dr. Darrick Penna was notified.  Area cleansed and dried, covered with a dry dressing. Will continue to monitor.

## 2012-06-08 NOTE — Clinical Social Work Psychosocial (Signed)
     Clinical Social Work Department BRIEF PSYCHOSOCIAL ASSESSMENT 06/08/2012  Patient:  Cody [name], Cody [name]     Account Number:  [id number]     Admit date:  06/03/2012  Clinical Social Worker:  Margaree Mackintosh  Date/Time:  06/08/2012 03:26 PM  Referred by:  Physician  Date Referred:  06/08/2012 Referred for  SNF Placement   Other Referral:   As back up to CIR.   Interview type:  Patient Other interview type:    PSYCHOSOCIAL DATA Living Status:  OTHER Admitted from facility:   Level of care:   Primary support name:  Jimmie Molly: [phone] ([employer] Other) Primary support relationship to patient:  SPOUSE Degree of support available:   Pt from CIR.    CURRENT CONCERNS Current Concerns  Post-Acute Placement   Other Concerns:   Pt is from [facility], unknown at this time if he will be able to return.    SOCIAL WORK ASSESSMENT / PLAN Clinical Automotive engineer reviewed chart and staffed case with RNCM and Jola Babinski with CIR.  CSW noted pt is from CIR PTA. At this time, it is unknown if pt will be able to return to CIR.  CSW met with pt at bedside.  CSW introduced self, explained role, and provided support.  CSW reviewed dc options.  Pt agreeable to SNF as a back up.  CSW reviewed SNF options.  CSW to continue to follow and assist as needed.   Assessment/plan status:  Information/Referral to [facility] Other assessment/ plan:   Information/referral to community resources:   SNF.    PATIENTS/FAMILYS RESPONSE TO PLAN OF CARE: Pt was pleasant and engaged in conversation.  Pt thanked CSW for intervention.

## 2012-06-08 NOTE — Progress Notes (Signed)
Zenia Resides, NP on [address] and made aware of pt having pulled out his PEG tube on night shift.

## 2012-06-08 NOTE — Progress Notes (Signed)
Speech Language Pathology Patient Details Name: Cody [name] MRN: [id number] DOB: [birth year] Today's Date: 06/08/2012 Time:  -    MBS scheduled today at [address] [place] [employer] [phone]  06/08/2012

## 2012-06-08 NOTE — Procedures (Addendum)
Objective Swallowing Evaluation: Modified Barium Swallowing Study  Patient Details  Name: Cody [name] MRN: [id number] Date of Birth: 06-Feb-1951  Today's Date: 06/08/2012 Time: 8657-8469 SLP Time Calculation (min): 32 min  Past Medical History:  Past Medical History  Diagnosis Date  . Stroke   . Hypertension   . Hyperlipidemia   . GERD (gastroesophageal reflux disease)   . COPD (chronic obstructive pulmonary disease)    Past Surgical History:  Past Surgical History  Procedure Laterality Date  . Gastrostomy N/A 05/19/2012    Procedure: PEG Possible Open Gastrostomy;  Surgeon: Kandis Cocking, MD;  Location: [facility] OR;  Service: General;  Laterality: N/A;  . Video bronchoscopy Bilateral 06/03/2012    Procedure: VIDEO BRONCHOSCOPY WITHOUT FLUORO;  Surgeon: [name], MD;  Location: [facility] ENDOSCOPY;  Service: Cardiopulmonary;  Laterality: Bilateral;   HPI:  Cody [name] is a 62 year old white male with PMH of COPD, GERD, HLD, HTN, 3 pack per day smoking history 20+ years, and HTN presenting to the ED 1/5 with complaints of worsening shortness of breath. History obtained by fiance. Diagnosed with potential influenza with aspiration PNA (? during intubation). Intubated due to respiratory distress 1/5. Extubated 1/15. Required trach 1/22. Hospital course complicated by new PE, Bilateral DVT s/p IVC filter 1/27. 2/24 noted to have worsened LLL atx on cxr. 2/25 mild temp (100.4) and worsened cxr. PCCM consulted for concern of LLL PNA. Patient had FEES 1/31 recommended NPO and MBS 2/14 recommended NPO with po trials. Was d/c to CIR and then transferred to acute care due to respiratory distress 2/26. Patient was downsized to size 6 cuffless trach 3/3.     Assessment / Plan / Recommendation Clinical Impression  Dysphagia Diagnosis: Mild oral phase dysphagia;Moderate pharyngeal phase dysphagia Clinical impression: Swallow function has improved from study on 2/14. Patient presents with a moderate  pharyngeal phase dysphagia with a mild oral phase component. Size 6 cuffless trach was in place during MBS with PMSV on. Oral phase was characterized by lingual residue due to base of tongue weakness. Patient aspirated on trials of thin liquids to below the level of the vocal cords with no attempt to clear penetrates spontaneously and maintained a mod-severe amount of residue in both the valelculae and the pyriform sinuses due to decreased base of tongue retraction and decreased laryngeal elevation. A chin tuck strategy was utilized which proved effective in decreasing amount of residue in vallecular and pyriform sinus space. SLP recommends patient upgrade to dys-1 (puree) diet with NO liquids, meds crushed in puree with full supervision and use of chin tuck. PMSV donned during po's and mulitple swallows during meals.    Treatment Recommendation  Therapy as outlined in treatment plan below    Diet Recommendation Dysphagia 1 (Puree);No liquids;Pudding-thick liquid   Medication Administration: Crushed with puree Supervision: Patient able to self feed;Full supervision/cueing for compensatory strategies Compensations: Slow rate;Small sips/bites;Multiple dry swallows after each bite/sip Postural Changes and/or Swallow Maneuvers: Seated upright 90 degrees;Upright 30-60 min after meal;Chin tuck    Other  Recommendations Oral Care Recommendations: Oral care BID Other Recommendations: Order thickener from pharmacy;Place PMSV during PO intake;Have oral suction available   Follow Up Recommendations  Inpatient Rehab    Frequency and Duration min 2x/week  2 weeks   Pertinent Vitals/Pain None reported    SLP Swallow Goals Patient will utilize recommended strategies during swallow to increase swallowing safety with: Supervision/safety;Moderate assistance Swallow Study Goal #2 - Progress: Progressing toward goal Pt. will perform exercises  for improved tongue base and laryngeal ROM and strength with max  visual/verbal/tactile cues.  General Date of Onset: 04/12/12 HPI: Cody [name] is a 62 year old white male with PMH of COPD, GERD, HLD, HTN, 3 pack per day smoking history 20+ years, and HTN presenting to the ED 1/5 with complaints of worsening shortness of breath. History obtained by fiance. Diagnosed with potential influenza with aspiration PNA (? during intubation). Intubated due to respiratory distress 1/5. Extubated 1/15. Required trach 1/22. Hospital course complicated by new PE, Bilateral DVT s/p IVC filter 1/27. 2/24 noted to have worsened LLL atx on cxr. 2/25 mild temp (100.4) and worsened cxr. PCCM consulted for concern of LLL PNA. Patient had FEES 1/31 recommended NPO and MBS 2/14 recommended NPO with po trials. Was d/c to CIR and then transferred to acute care due to respiratory distress 2/26. Patient was downsized to size 6 cuffless trach 3/3. Type of Study: Modified Barium Swallowing Study Reason for Referral: Objectively evaluate swallowing function Previous Swallow Assessment: 05/22/12 MBS completed. Diet Prior to this Study: NPO;PEG tube (Patient accidentally pulled PEG out) Temperature Spikes Noted: No Respiratory Status: Trach Trach Size and Type: Uncuffed;#6;With PMSV in place History of Recent Intubation: No Behavior/Cognition: Cooperative;Alert;Pleasant mood Oral Cavity - Dentition: Poor condition Oral Motor / Sensory Function: Impaired - see Bedside swallow eval Self-Feeding Abilities: Able to feed self;Needs assist Patient Positioning: Upright in chair Baseline Vocal Quality: Low vocal intensity;Hoarse Volitional Cough: Weak Volitional Swallow: Able to elicit    Reason for Referral Objectively evaluate swallowing function   Oral Phase Oral Preparation/Oral Phase Oral Phase: Impaired Oral - Nectar Oral - Nectar Teaspoon: Lingual/palatal residue;Reduced posterior propulsion Oral - Nectar Cup: Lingual/palatal residue;Reduced posterior propulsion Oral - Thin Oral - Thin  Teaspoon: Lingual/palatal residue;Reduced posterior propulsion Oral - Solids Oral - Puree: Lingual/palatal residue;Reduced posterior propulsion Oral - Mechanical Soft: Lingual/palatal residue;Right pocketing in lateral sulci;Weak lingual manipulation   Pharyngeal Phase Pharyngeal Phase Pharyngeal Phase: Impaired Pharyngeal - Honey Pharyngeal - Honey Cup: Not tested Pharyngeal - Nectar Pharyngeal - Nectar Teaspoon: Pharyngeal residue - valleculae;Pharyngeal residue - pyriform sinuses;Delayed swallow initiation;Premature spillage to valleculae;Reduced tongue base retraction (Minimal delay in swallow initiation) Pharyngeal - Nectar Cup: Delayed swallow initiation;Premature spillage to valleculae;Pharyngeal residue - valleculae;Pharyngeal residue - pyriform sinuses;Reduced laryngeal elevation Pharyngeal - Thin Pharyngeal - Thin Teaspoon: Reduced tongue base retraction;Reduced laryngeal elevation;Pharyngeal residue - valleculae;Pharyngeal residue - pyriform sinuses;Penetration/Aspiration before swallow;Penetration/Aspiration during swallow;Delayed swallow initiation;Premature spillage to valleculae Penetration/Aspiration details (thin teaspoon): Material enters airway, CONTACTS cords and not ejected out Pharyngeal - Thin Cup: Not tested Penetration/Aspiration details (thin cup): Material does not enter airway Pharyngeal - Solids Pharyngeal - Puree: Premature spillage to valleculae;Delayed swallow initiation;Reduced tongue base retraction;Pharyngeal residue - valleculae;Pharyngeal residue - pyriform sinuses Penetration/Aspiration details (puree): Material does not enter airway Pharyngeal - Mechanical Soft: Not tested  Cervical Esophageal Phase    GO   Berdine Dance SLP student           Berdine Dance 06/08/2012, 3:37 PM                            Berdine Dance SLP student Berdine Dance 06/08/2012, 3:37 PM

## 2012-06-09 ENCOUNTER — Inpatient Hospital Stay (HOSPITAL_COMMUNITY): Payer: Medicaid Other

## 2012-06-09 LAB — PROTIME-INR
INR: 4.38 — ABNORMAL HIGH (ref 0.00–1.49)
INR: 3.84 — ABNORMAL HIGH (ref 0.00–1.49)
Prothrombin Time: 39.1 seconds — ABNORMAL HIGH (ref 11.6–15.2)

## 2012-06-09 LAB — BASIC METABOLIC PANEL
Calcium: 8.8 mg/dL (ref 8.4–10.5)
GFR calc Af Amer: >90 mL/min (ref >90)
GFR calc non Af Amer: >90 mL/min (ref >90)
Potassium: 3 mEq/L — ABNORMAL LOW (ref 3.5–5.1)
Sodium: 144 mEq/L (ref 135–145)

## 2012-06-09 MED ORDER — ENSURE PUDDING PO PUDG
1.0000 | Freq: Three times a day (TID) | ORAL | Status: DC
  Administered 2012-06-09 – 2012-06-18 (×23): 1 via ORAL

## 2012-06-09 MED ORDER — TRAZODONE HCL 50 MG PO TABS
50.0000 mg | ORAL_TABLET | Freq: Every day | ORAL | Status: DC
  Administered 2012-06-10 – 2012-06-12 (×3): 50 mg via ORAL
  Filled 2012-06-09 (×5): qty 1

## 2012-06-09 NOTE — Progress Notes (Signed)
NUTRITION FOLLOW UP  DOCUMENTATION CODES  Per approved criteria   -Severe malnutrition in the context    Intervention:   1. Magic cup TID with meals, each supplement provides 290 kcal and 9 grams of protein. 2. Ensure Pudding po TID, each supplement provides 170 kcal and 4 grams of protein.  3. MVI daily 4. RD to continue to follow nutrition care plan  Nutrition Dx:   Increased nutrient needs related to PNA, acute illness as evidenced by estimated nutrition needs. Ongoing.   Goal:   EN to meet >/= 90% of estimated nutrition needs. Unmet.  Monitor:   weight, labs, I/O's, po intake, supplement acceptance  Assessment:   PEG tube accidentally pulled out on 3/3, TF also discontinued at that time. Dr. Sung Amabile noted that he did not want PEG replaced as he suspected that pt would pass SLP eval. MBSS completed on 3/3 with recommendations for Dysphagia 1 diet with pudding-thickened liquids. Transferred to 6700 yesterday.  Dysphagia 1 diet initiated yesterday. No meal intake recorded as of this time.  Height: Ht Readings from Last 1 Encounters:  06/08/12 5\' 8"  (1.727 m)    Weight Status:   Wt Readings from Last 1 Encounters:  06/08/12 177 lb (80.287 kg)  Wt trending down  Re-estimated needs:  Kcal: 2000 - 2200 Protein: 125 - 135 grams Fluid: 2 - 2.2  Skin: Stage I pressure ulcer to sacrum; abdominal incision  Diet Order: Dysphagia 1 with pudding thickened liquids   Intake/Output Summary (Last 24 hours) at 06/09/12 1000 Last data filed at 06/09/12 0300  Gross per 24 hour  Intake 1066.25 ml  Output      1 ml  Net 1065.25 ml    Last BM: 3/3   Labs:   Recent Labs Lab 06/04/12 0430 06/05/12 0844 06/06/12 0430 06/07/12 0500 06/08/12 0700 06/09/12 0640  NA 137  --  143 144 154* 144  K 3.6  --  2.9* 2.9* 4.3 3.0*  CL 95*  --  101 102 110 102  CO2 35*  --  34* 33* 37* 36*  BUN 14  --  14 17 14 13   CREATININE 0.65  --  0.74 0.70 0.71 0.74  CALCIUM 8.9  --  9.1 9.1  9.9 8.8  MG 1.7 1.7  --  1.9  --   --   PHOS 3.6  --  2.8 3.0 3.6  --   GLUCOSE 119*  --  136* 136* 111* 118*    CBG (last 3)  No results found for this basename: GLUCAP,  in the last 72 hours  Scheduled Meds: . antiseptic oral rinse  1 application Mouth Rinse QID  . chlorhexidine  15 mL Mouth/Throat BID  . piperacillin-tazobactam (ZOSYN)  IV  3.375 g Intravenous Q8H  . Warfarin - Pharmacist Dosing Inpatient   Does not apply q1800    Continuous Infusions: . dextrose 75 mL/hr at 06/08/12 [address] [place], [place], [place] Pager: [phone] After-hours pager: [phone]

## 2012-06-09 NOTE — Progress Notes (Signed)
Name: Cody [name]  MRN: [id number]  DOB: [birth year]  ADMISSION DATE: 05/29/2012   CONSULTATION DATE: 2/25 rehab, now admission 2/26  BRIEF PATIENT DESCRIPTION: 62 y/o M with PMH of COPD, GERD, HLD, HTN, CVA admitted 1/5-2/21 with VDRF secondary to influenza A and aspiration PNA. Required trach 1/22. Hospital course complicated by new PE, Bilateral DVT s/p IVC filter 1/27. 2/24 noted to have worsened LLL atx on cxr. 2/25 mild temp (100.4) and worsened cxr. PCCM consulted for concern of LLL PNA.   CULTURES:  2/25 Sputum >> MSSA 2/26 bronch BAL>>> negative Blood 2/27 >> neg 2/28 C diff >> NEG  ANTIBIOTICS:  Zosyn 2/25> planned stop 3/4 vanc 2/26 >>3/1  SIGNIFICANT EVENTS / STUDIES:  1/5-2/21 - Admit to [facility] for [facility]DRF 2/2 flu, asp PNA s/p trach  2/26 retrach, bronch, distress, vent needed 2/27 off full support. ATC 2/28 back on to full support 2/28  Back to ATC 3/03 PEG pulled out, passed for D1, pudding thick liquid   SUBJECTIVE:   Pt reports he is nervous and can not sleep at night with ambien. Denies SOB.  Walked with PT.  Tolerating PMV with supervision  VITAL SIGNS: Temp:  [97.6 F (36.4 C)-99 F (37.2 C)] 98.9 F (37.2 C) (03/04 1021) Pulse Rate:  [84-129] 112 (03/04 1300) Resp:  [20-25] 20 (03/04 1300) BP: (119-152)/(75-110) 119/80 mmHg (03/04 1021) SpO2:  [91 %-100 %] 93 % (03/04 1300) FiO2 (%):  [35 %-60 %] 35 % (03/04 1300) Weight:  [177 lb (80.287 kg)] 177 lb (80.287 kg) (03/03 2031)  INTAKE / OUTPUT: Intake/Output     03/03 0701 - 03/04 0700 03/04 0701 - 03/05 0700   I.V. (mL/kg) 1046.3 (13)    Other     NG/GT     IV Piggyback 50    Total Intake(mL/kg) 1096.3 (13.7)    Urine (mL/kg/hr) 200 (0.1)    Stool 2 (0)    Total Output 202     Net +894.3          Urine Occurrence 1 x     PHYSICAL EXAMINATION:  General:  Chronically ill in NAD Neuro:  nonfocal HEENT:  Trach clean, weak but improved phonation with PMV Cardiovascular:  s1 s2 RRR Lungs:   Coarse breath rhonchi bilaterally. 35% ATC Abdomen:  Soft, BS wnl Musculoskeletal:  No edema Skin:  No rash  Recent Labs Lab 06/07/12 0500 06/08/12 0700 06/09/12 0640  NA 144 154* 144  K 2.9* 4.3 3.0*  CL 102 110 102  CO2 33* 37* 36*  BUN 17 14 13   CREATININE 0.70 0.71 0.74  GLUCOSE 136* 111* 118*    Recent Labs Lab 06/06/12 0430 06/07/12 0500 06/08/12 0700  HGB 11.2* 10.6* 11.1*  HCT 35.1* 33.8* 36.9*  WBC 14.0* 13.2* 12.5*  PLT 312 331 366   Lab Results  Component Value Date   INR 3.84* 06/09/2012   INR 4.38* 06/09/2012   INR 2.96* 06/08/2012   Imaging: Dg Chest 2 View  06/08/2012  *RADIOLOGY REPORT*  Clinical Data: Shortness of breath.  Pleural effusion.  CHEST - 2 VIEW  Comparison: Plain film chest 06/07/2012.  Findings: Tracheostomy tube is again noted.  Left pleural effusion and airspace disease persist but have improved.  Patchy airspace disease in the right lung base is also markedly improved.  On the lateral view, a short air fluid level is seen in the lower left hemithorax.  Heart size is normal.  IMPRESSION:  1.  Improved left  pleural effusion and airspace disease.  Short air fluid level in the lower left hemithorax is compatible with cavitation. 2.  Improved right basilar airspace disease.   Original Report Authenticated By: Holley Dexter, M.D.    Dg Chest Port 1 View  06/09/2012  *RADIOLOGY REPORT*  Clinical Data: Evaluate left lower lobe air space disease  PORTABLE CHEST - 1 VIEW  Comparison: 06/08/2012; 06/07/2012; 06/01/2012; 05/02/2012; chest CT - 05/02/2012  Findings: Grossly unchanged cardiac silhouette and mediastinal contours with persistent obscuration of the left heart border secondary to grossly unchanged small left-sided pleural effusion and left mid and lower lung heterogeneous / consolidative opacities. Unchanged prominence of the central pulmonary vasculature. Stable position of support apparatus.  Minimal worsening right basilar opacities have represent  subsegmental atelectasis.  No definite pneumothorax. Presumed chondroid lesion within the proximal right humeral metaphysis, incompletely evaluated.  IMPRESSION: 1.  Stable positioning of support apparatus.  No pneumothorax. 2.  Grossly unchanged small left-sided pleural effusion and left mid and basilar heterogeneous / consolidative opacities worrisome for infection and/or aspiration. 3.  Worsening right basilar opacities favored to represent atelectasis. 4.  Presumed chondroid lesion within the right humeral metaphysis, incompletely evaluated.  Further evaluation with dedicated right humeral radiographs may be performed as clinically indicated.   Original Report Authenticated By: Tacey Ruiz, MD    Dg Swallowing Func-speech Pathology  06/08/2012  Breck Coons [place], CCC-SLP     06/08/2012  3:45 PM Objective Swallowing Evaluation: Modified Barium Swallowing Study   Patient Details  Name: [name] MRN: [id number] Date of Birth: [birth year]  Today's Date: 06/08/2012 Time: 1884-1660 SLP Time Calculation (min): 32 min  Past Medical History:  Past Medical History  Diagnosis Date  . Stroke   . Hypertension   . Hyperlipidemia   . GERD (gastroesophageal reflux disease)   . COPD (chronic obstructive pulmonary disease)    Past Surgical History:  Past Surgical History  Procedure Laterality Date  . Gastrostomy N/A 05/19/2012    Procedure: PEG Possible Open Gastrostomy;  Surgeon: Kandis Cocking, MD;  Location: [facility] OR;  Service: General;  Laterality:  N/A;  . Video bronchoscopy Bilateral 06/03/2012    Procedure: VIDEO BRONCHOSCOPY WITHOUT FLUORO;  Surgeon: [name], MD;  Location: [facility] ENDOSCOPY;  Service:  Cardiopulmonary;  Laterality: Bilateral;   HPI:  Mr. Parmelee is a 62 year old white male with PMH of COPD, GERD,  HLD, HTN, 3 pack per day smoking history 20+ years, and HTN  presenting to the ED 1/5 with complaints of worsening shortness  of breath. History obtained by fiance. Diagnosed with potential  influenza  with aspiration PNA (? during intubation). Intubated  due to respiratory distress 1/5. Extubated 1/15. Required trach  1/22. Hospital course complicated by new PE, Bilateral DVT s/p  IVC filter 1/27. 2/24 noted to have worsened LLL atx on cxr. 2/25  mild temp (100.4) and worsened cxr. PCCM consulted for concern of  LLL PNA. Patient had FEES 1/31 recommended NPO and MBS 2/14  recommended NPO with po trials. Was d/c to CIR and then  transferred to acute care due to respiratory distress 2/26.  Patient was downsized to size 6 cuffless trach 3/3.     Assessment / Plan / Recommendation Clinical Impression  Dysphagia Diagnosis: Mild oral phase dysphagia;Moderate  pharyngeal phase dysphagia Clinical impression: Swallow function has improved from study on  2/14. Patient presents with a moderate pharyngeal phase dysphagia  with a mild oral phase component. Size 6  cuffless trach was in  place during MBS with PMSV on. Oral phase was characterized by  lingual residue due to base of tongue weakness. Patient aspirated  on trials of thin liquids to below the level of the vocal cords  with no attempt to clear penetrates spontaneously and maintained  a mod-severe amount of residue in both the valelculae and the  pyriform sinuses due to decreased base of tongue retraction and  decreased laryngeal elevation. A chin tuck strategy was utilized  which proved effective in decreasing amount of residue in  vallecular and pyriform sinus space. SLP recommends patient  upgrade to dys-1 (puree) diet with NO liquids, meds crushed in  puree with full supervision and use of chin tuck. PMSV donned  during po's and mulitple swallows during meals.    Treatment Recommendation  Therapy as outlined in treatment plan below    Diet Recommendation Dysphagia 1 (Puree);No liquids;Pudding-thick  liquid   Medication Administration: Crushed with puree Supervision: Patient able to self feed;Full supervision/cueing  for compensatory strategies Compensations: Slow  rate;Small sips/bites;Multiple dry swallows  after each bite/sip Postural Changes and/or Swallow Maneuvers: Seated upright 90  degrees;Upright 30-60 min after meal;Chin tuck    Other  Recommendations Oral Care Recommendations: Oral care BID Other Recommendations: Order thickener from pharmacy;Place PMSV  during PO intake;Have oral suction available   Follow Up Recommendations  Inpatient Rehab    Frequency and Duration min 2x/week  2 weeks   Pertinent Vitals/Pain None reported    SLP Swallow Goals Patient will utilize recommended strategies during swallow to  increase swallowing safety with: Supervision/safety;Moderate  assistance Swallow Study Goal #2 - Progress: Progressing toward goal   General Date of Onset: 04/12/12 HPI: Mr. Bagheri is a 62 year old white male with PMH of COPD,  GERD, HLD, HTN, 3 pack per day smoking history 20+ years, and HTN  presenting to the ED 1/5 with complaints of worsening shortness  of breath. History obtained by fiance. Diagnosed with potential  influenza with aspiration PNA (? during intubation). Intubated  due to respiratory distress 1/5. Extubated 1/15. Required trach  1/22. Hospital course complicated by new PE, Bilateral DVT s/p  IVC filter 1/27. 2/24 noted to have worsened LLL atx on cxr. 2/25  mild temp (100.4) and worsened cxr. PCCM consulted for concern of  LLL PNA. Patient had FEES 1/31 recommended NPO and MBS 2/14  recommended NPO with po trials. Was d/c to CIR and then  transferred to acute care due to respiratory distress 2/26.  Patient was downsized to size 6 cuffless trach 3/3. Type of Study: Modified Barium Swallowing Study Reason for Referral: Objectively evaluate swallowing function Previous Swallow Assessment: 05/22/12 MBS completed. Diet Prior to this Study: NPO;PEG tube (Patient accidentally  pulled PEG out) Temperature Spikes Noted: No Respiratory Status: Trach Trach Size and Type: Uncuffed;#6;With PMSV in place History of Recent Intubation: No Behavior/Cognition:  Cooperative;Alert;Pleasant mood Oral Cavity - Dentition: Poor condition Oral Motor / Sensory Function: Impaired - see Bedside swallow  eval Self-Feeding Abilities: Able to feed self;Needs assist Patient Positioning: Upright in chair Baseline Vocal Quality: Low vocal intensity;Hoarse Volitional Cough: Weak Volitional Swallow: Able to elicit    Reason for Referral Objectively evaluate swallowing function   Oral Phase Oral Preparation/Oral Phase Oral Phase: Impaired Oral - Nectar Oral - Nectar Teaspoon: Lingual/palatal residue;Reduced posterior  propulsion Oral - Nectar Cup: Lingual/palatal residue;Reduced posterior  propulsion Oral - Thin Oral - Thin Teaspoon: Lingual/palatal residue;Reduced posterior  propulsion Oral - Solids Oral -  Puree: Lingual/palatal residue;Reduced posterior  propulsion Oral - Mechanical Soft: Lingual/palatal residue;Right pocketing  in lateral sulci;Weak lingual manipulation   Pharyngeal Phase Pharyngeal Phase Pharyngeal Phase: Impaired Pharyngeal - Honey Pharyngeal - Honey Cup: Not tested Pharyngeal - Nectar Pharyngeal - Nectar Teaspoon: Pharyngeal residue -  valleculae;Pharyngeal residue - pyriform sinuses;Delayed swallow  initiation;Premature spillage to valleculae;Reduced tongue base  retraction (Minimal delay in swallow initiation) Pharyngeal - Nectar Cup: Delayed swallow initiation;Premature  spillage to valleculae;Pharyngeal residue - valleculae;Pharyngeal  residue - pyriform sinuses;Reduced laryngeal elevation Pharyngeal - Thin Pharyngeal - Thin Teaspoon: Reduced tongue base  retraction;Reduced laryngeal elevation;Pharyngeal residue -  valleculae;Pharyngeal residue - pyriform  sinuses;Penetration/Aspiration before  swallow;Penetration/Aspiration during swallow;Delayed swallow  initiation;Premature spillage to valleculae Penetration/Aspiration details (thin teaspoon): Material enters  airway, CONTACTS cords and not ejected out Pharyngeal - Thin Cup: Not tested Penetration/Aspiration  details (thin cup): Material does not  enter airway Pharyngeal - Solids Pharyngeal - Puree: Premature spillage to valleculae;Delayed  swallow initiation;Reduced tongue base retraction;Pharyngeal  residue - valleculae;Pharyngeal residue - pyriform sinuses Penetration/Aspiration details (puree): Material does not enter  airway Pharyngeal - Mechanical Soft: Not tested  Cervical Esophageal Phase    GO   Berdine Dance SLP student           Berdine Dance 06/08/2012, 3:37 PM                            Berdine Dance SLP student Berdine Dance 06/08/2012, 3:37 PM      ASSESSMENT / PLAN:  Acute respiratory failure: secondary to influenza A and aspiration PNA. PE: occurred in hospital Failure to wean: has persistent left sided airspace disease c/w PNA, ATX and some element of effusion.  s/p upsize trach LLL HCAP Increased L pleural effusion 3/2  P:   - ATC as tolerated since 2/28-->continue cuffless # 6 - PMV with SLP - see flowsheet re: abx - f/u CT chest 3/04 - continue coumadin - assess L chest with [place], if sig effusion--> hold coumadin and thora when INR <2  Hypernatremia P: -pulled PEG, continue D5 until sure he can take adequate fluids orally   Anemia>>stable P:  - f/u CBC  Dysphagia  Diarrhea X3 - c diff PCR (negative) P: - passed swallow eval for d1, pudding thick liquids - No indication for SUP  PEG dislodgement Looks like it was placed in Jan 2014 P: Passed swallow eval, hold off on feeding tube placement   Insomnia P: -d/c ambien -trial trazodone for anxiety / sleep -caution with sedating medications  Global:  Plan for SNF as family feels not likely able to be at home to provide supervision once d/c'd from Rehab (CIR).    Canary Brim, NP-C [facility] Pgr: [phone] or [phone]     Reviewed above, examined pt, and agree with assessment/plan.  He is slowly improving.  He will need SNF placement to continue rehab.  Bedside u/s today shows pleural  effusion with some debris.  Will get non contrast CT chest to further assess whether intervention needed for effusion.  Coralyn Helling, MD [facility] 06/09/2012, 3:57 PM Pager:  [phone] After 3pm call: [phone]

## 2012-06-09 NOTE — Progress Notes (Signed)
PM&R Consults  Pt seen and examined. He is well known to me from his previous inpatient rehab stay . I spoke with him at length regarding a potential inpatient rehab re-admission. He is currently at a minimal assistance level, and discharge goals would be supervision. Prior to his transfer from inpatient rehab, we were already making plans for eventual SNF placement as family could not provide for supervision level needs.   Furthermore, Mr. Ice indicated to me that he did not want to return to inpatient rehab, preferring that his current location be his "last stop."    He sat at the EOB during our exam/discussion with a HR in the 120's and O2 sats in high 80's.   Given the above, I can't justify a return to inpatient rehab. Would pursue SNF placement at this time.  Ranelle Oyster, MD, [place] [facility]

## 2012-06-09 NOTE — Progress Notes (Signed)
ANTICOAGULATION & ANTIBIOTIC CONSULT NOTE - Follow Up Consult  Pharmacy Consult for warfarin, Zosyn Indication: Hx PE/DVT; aspiration PNA  Allergies  Allergen Reactions  . Protamine Anaphylaxis    Patient Measurements: Height: 5\' 8"  (172.7 cm) Weight: 177 lb (80.287 kg) IBW/kg (Calculated) : 68.4   Vital Signs: Temp: 98.2 F (36.8 C) (03/04 0522) Temp src: Oral (03/04 0522) BP: 131/75 mmHg (03/04 0522) Pulse Rate: 85 (03/04 0522)  Labs:  Recent Labs  06/07/12 0500 06/08/12 0700 06/09/12 0640 06/09/12 0845  HGB 10.6* 11.1*  --   --   HCT 33.8* 36.9*  --   --   PLT 331 366  --   --   LABPROT 28.0* 29.3* 39.1* 35.5*  INR 2.79* 2.96* 4.38* 3.84*  CREATININE 0.70 0.71 0.74  --     Estimated Creatinine Clearance: 90.3 ml/min (by C-G formula based on Cr of 0.74).   Medications:  Scheduled:  . antiseptic oral rinse  1 application Mouth Rinse QID  . chlorhexidine  15 mL Mouth/Throat BID  . piperacillin-tazobactam (ZOSYN)  IV  3.375 g Intravenous Q8H  . [COMPLETED] warfarin  2.5 mg Oral ONCE-1800  . Warfarin - Pharmacist Dosing Inpatient   Does not apply q1800  . [DISCONTINUED] famotidine  20 mg Per Tube BID  . [DISCONTINUED] feeding supplement (OXEPA)  1,000 mL Per Tube Q24H  . [DISCONTINUED] feeding supplement  30 mL Per Tube QID  . [DISCONTINUED] free water  240 mL Per Tube Q8H  . [DISCONTINUED] zolpidem  10 mg Oral QHS    Assessment: 62 y.o. male on warfarin for recent hx PE/DVT (Jan '14). Noted pt with IVC filter. FFP given 2/26 for trach placement. INR today with quick increase to supra-therapeutic level.  INR 4.38 - ordered recheck = 3.84.  No overt bleeding noted per chart notes.  On Zosyn for ?PNA, day #6.  Afebrile, WBC 12.5 at last check 3/3.  Cultures negative to date.  Goal of Therapy:  INR 2-3 Monitor platelets by anticoagulation protocol: Yes   Plan:  1. Hold Coumadin tonight. 2. F/u INR in AM 3. Continue Zosyn 3.375 mg IV q 8 hrs.  Per MD  note, planning to d/c Zosyn today.  Tad Moore, BCPS  Clinical Pharmacist Pager [phone]  06/09/2012 9:50 AM

## 2012-06-09 NOTE — Progress Notes (Signed)
Speech Language Pathology Dysphagia Treatment Patient Details Name: Cody [name] MRN: [id number] DOB: 11/01/1950 Today's Date: 06/09/2012 Time: 4098-1191 SLP Time Calculation (min): 22 min  Assessment / Plan / Recommendation Clinical Impression  F/u for dysphagia after yesterday's MBS.  Reviewed swallowing precautions with pt, fiance, and granddaughter.  With use of mirror, pt and fiance instructed how to place and remove PMSV. Pt able to donn/doff valve independently.   Reviewed necessity of wearing valve during all PO intake to restore normal swallow pressures.  Pt tolerated purees with excellent recall of chin tuck; required min cues initially, then independent for subsequent swallows.  No overt indication of airway compromise with PO intake - tolerating well overall and motivated to eat.   Recommend continuing Dysphagia 1 diet, no liquids; wear valve with intake.  Pt may wear valve with supervision from staff and family.  SLP will continue to follow,.    Diet Recommendation  Continue with Current Diet: Dysphagia 1 (puree)    SLP Plan Continue with current plan of care   Pertinent Vitals/Pain No pain   Swallowing Goals  SLP Swallowing Goals Patient will utilize recommended strategies during swallow to increase swallowing safety with: Supervision/safety;Moderate assistance Swallow Study Goal #2 - Progress: Progressing toward goal Goal #3: Pt. will perform exercises for improved tongue base and laryngeal ROM and strength with max visual/verbal/tactile cues. Swallow Study Goal #3 - Progress: Progressing toward goal  General Temperature Spikes Noted: No Respiratory Status: Trach Behavior/Cognition: Cooperative;Alert;Pleasant mood Oral Cavity - Dentition: Poor condition Patient Positioning: Upright in bed  Oral Cavity - Oral Hygiene Does patient have any of the following "at risk" factors?: Oxygen therapy - cannula, mask, simple oxygen devices Brush patient's teeth BID with  toothbrush (using toothpaste with fluoride): Yes Patient is HIGH RISK - Oral Care Protocol followed (see row info): Yes   Dysphagia Treatment Treatment focused on: Skilled observation of diet tolerance;Patient/family/caregiver education;Utilization of compensatory strategies Family/Caregiver Educated: fiance Treatment Methods/Modalities: Effortful swallow Patient observed directly with PO's: Yes Type of PO's observed: Dysphagia 1 (puree) Feeding: Able to feed self Liquids provided via: Teaspoon Oral Phase Signs & Symptoms: Prolonged bolus formation Pharyngeal Phase Signs & Symptoms: Suspected delayed swallow initiation Type of cueing: Verbal Amount of cueing: Minimal   GO    Cody [name], [place] CCC/SLP Pager [phone]   Cody [name] Cody [name] 06/09/2012, 2:06 PM

## 2012-06-09 NOTE — Progress Notes (Signed)
Physical Therapy Treatment Patient Details Name: Cody [name] MRN: [id number] DOB: 1950/05/15 Today's Date: 06/09/2012 Time: 4098-1191 PT Time Calculation (min): 39 min  PT Assessment / Plan / Recommendation Comments on Treatment Session  Patient continues to improve with activity tolerance, through still needs seated rests throughout session.  Today more inquisitive into situation and rehab concerns.  NP from PCCM present during session to field questions as well.  Agree with SNF level rehab to allow increased time with all medical issues to resolve prior to d/c home with intermittent supervision,    Follow Up Recommendations  SNF           Equipment Recommendations  Rolling walker with 5" wheels       Frequency Min 3X/week   Plan Discharge plan needs to be updated    Precautions / Restrictions Precautions Precautions: Fall Precaution Comments: trach   Pertinent Vitals/Pain Min c/o left hip pain with ambulation;HR 111, SpO2 93% on 35% trach collar with PMSV    Mobility  Bed Mobility Details for Bed Mobility Assistance: pt sitting edge of bed upon my entry Transfers Sit to Stand: 4: Min guard;From chair/3-in-1;From bed;With upper extremity assist;With armrests Stand to Sit: 4: Min guard;With upper extremity assist;With armrests;To chair/3-in-1;To bed Details for Transfer Assistance: cues for safety/hand placement Ambulation/Gait Ambulation/Gait Assistance: 4: Min assist Ambulation Distance (Feet): 80 Feet (x 2 and 90') Assistive device: Rolling walker Ambulation/Gait Assistance Details: loss of balance x 2 to left with noted left trendelenberg Gait Pattern: Decreased stride length;Step-through pattern;Decreased hip/knee flexion - right;Decreased hip/knee flexion - left;Narrow base of support;Trendelenburg    Exercises     PT Goals Acute Rehab PT Goals Pt will go Sit to Stand: with supervision;with upper extremity assist PT Goal: Sit to Stand - Progress: Progressing  toward goal Pt will go Stand to Sit: with supervision;with upper extremity assist PT Goal: Stand to Sit - Progress: Progressing toward goal Pt will Ambulate: >150 feet;with rolling walker;with supervision PT Goal: Ambulate - Progress: Progressing toward goal  Visit Information  Last PT Received On: 06/09/12    Subjective Data  Subjective: Pt. with multiple questions about medical issues, rehab, speech therapy, etc.   Cognition  Cognition Overall Cognitive Status: Impaired Area of Impairment: Memory;Problem solving Difficult to assess due to: Tracheostomy Arousal/Alertness: Awake/alert Orientation Level: Appears intact for tasks assessed Behavior During Session: [facility] for tasks performed Memory Deficits: needs to be reminded about topics he wanted to ask the MD.  Following Commands: Follows one step commands consistently Awareness of Deficits: stated he didn't need supervision for eating Cognition - Other Comments: admits to confusion about rehab issues    Balance  Dynamic Sitting Balance Dynamic Sitting - Balance Support: Feet supported;No upper extremity supported Dynamic Sitting - Level of Assistance: 6: Modified independent (Device/Increase time) Dynamic Sitting - Balance Activities: Reaching for objects;Forward lean/weight shifting  End of Session PT - End of Session Equipment Utilized During Treatment: Gait belt;Oxygen Activity Tolerance: Patient tolerated treatment well Patient left: in bed;with family/visitor present   GP     [facility] 06/09/2012, 1:57 PM Sheran Lawless, PT [phone] 06/09/2012

## 2012-06-10 ENCOUNTER — Inpatient Hospital Stay (HOSPITAL_COMMUNITY): Payer: Medicaid Other

## 2012-06-10 LAB — CBC
Hemoglobin: 11.4 g/dL — ABNORMAL LOW (ref 13.0–17.0)
Platelets: 423 10*3/uL — ABNORMAL HIGH (ref 150–400)
RBC: 3.94 MIL/uL — ABNORMAL LOW (ref 4.22–5.81)
WBC: 12.2 10*3/uL — ABNORMAL HIGH (ref 4.0–10.5)

## 2012-06-10 LAB — BASIC METABOLIC PANEL
GFR calc Af Amer: >90 mL/min (ref >90)
GFR calc non Af Amer: >90 mL/min (ref >90)
Potassium: 3.1 mEq/L — ABNORMAL LOW (ref 3.5–5.1)
Sodium: 140 mEq/L (ref 135–145)

## 2012-06-10 LAB — CULTURE, BLOOD (ROUTINE X 2): Culture: NO GROWTH

## 2012-06-10 LAB — PROTIME-INR
INR: 2.11 — ABNORMAL HIGH (ref 0.00–1.49)
INR: 1.83 — ABNORMAL HIGH (ref 0.00–1.49)
Prothrombin Time: 22.8 seconds — ABNORMAL HIGH (ref 11.6–15.2)
Prothrombin Time: 20.5 seconds — ABNORMAL HIGH (ref 11.6–15.2)

## 2012-06-10 MED ORDER — POTASSIUM CHLORIDE CRYS ER 20 MEQ PO TBCR
40.0000 meq | EXTENDED_RELEASE_TABLET | ORAL | Status: AC
  Administered 2012-06-10 (×2): 40 meq via ORAL
  Filled 2012-06-10 (×2): qty 2

## 2012-06-10 MED ORDER — HEPARIN (PORCINE) IN NACL 100-0.45 UNIT/ML-% IJ SOLN
1600.0000 [IU]/h | INTRAMUSCULAR | Status: DC
  Administered 2012-06-10: 1250 [IU]/h via INTRAVENOUS
  Administered 2012-06-11: 1600 [IU]/h via INTRAVENOUS
  Filled 2012-06-10 (×3): qty 250

## 2012-06-10 MED ORDER — POTASSIUM CHLORIDE 20 MEQ/15ML (10%) PO LIQD
40.0000 meq | ORAL | Status: DC
  Filled 2012-06-10 (×2): qty 30

## 2012-06-10 NOTE — Progress Notes (Signed)
06/10/12   Pharmacy-  Heparin 1855  INR 1.83  62yo male with history of recent PE & DVT, to start heparin bridge while warfarin is on hold.  INR is now < 2, will start heparin with goal heparin level 0.3-0.7.  1.  Heparin 1250 units/hr, no bolus 2.  Heparin level 8 hours 3.  Daily heparin level and CBC.  Marisue Humble, PharmD Clinical Pharmacist [facility]

## 2012-06-10 NOTE — Progress Notes (Signed)
ANTICOAGULATION CONSULT NOTE - Follow Up Consult  Pharmacy Consult for Heparin bridge while Warfarin on hold Indication: Hx recent PE/DVT (Jan '14)  Allergies  Allergen Reactions  . Protamine Anaphylaxis    Patient Measurements: Height: 5\' 8"  (172.7 cm) Weight: 182 lb 1.6 oz (82.6 kg) IBW/kg (Calculated) : 68.4 Heparin Dosing Weight: 82.6 kg  Vital Signs: Temp: 97.7 F (36.5 C) (03/05 0930) Temp src: Oral (03/05 0930) BP: 153/96 mmHg (03/05 0930) Pulse Rate: 114 (03/05 0930)  Labs:  Recent Labs  06/08/12 0700 06/09/12 0640 06/09/12 0845 06/10/12 0620  HGB 11.1*  --   --  11.4*  HCT 36.9*  --   --  35.5*  PLT 366  --   --  423*  LABPROT 29.3* 39.1* 35.5* 22.8*  INR 2.96* 4.38* 3.84* 2.11*  CREATININE 0.71 0.74  --  0.73    Estimated Creatinine Clearance: 97.8 ml/min (by C-G formula based on Cr of 0.73).   Assessment: 62 y.o M resumed on warfarin from PTA for hx recent DVT/PE in Jan '14. The patient's INR had a big rise on 3/4 up to 4.38 << 2.96. INR this a.m is back within the therapeutic range (INR 2.11 << 3.84, goal of 2-3). CCM has now ordered to hold warfarin and transition to a heparin bridge due to a possible need for a pleur-x catheter vs thoracic procedure. Given the patient's odd rise in INR on 3/4 -- will plan to recheck an INR this evening and start heparin drip when the INR is <2.   Goal of Therapy:  INR 2-3 Monitor platelets by anticoagulation protocol: Yes   Plan:  1. Hold warfarin dose today 2. Will recheck PT/INR this evening and plan to start heparin when INR <2 3. Will continue to monitor for any signs/symptoms of bleeding  Georgina Pillion, PharmD, BCPS Clinical Pharmacist Pager: [phone] 06/10/2012 10:25 AM

## 2012-06-10 NOTE — Progress Notes (Signed)
Speech Language Pathology Dysphagia and PMSVTreatment Patient Details Name: EILEEN CROSWELL MRN: [id number] DOB: 03-22-1951 Today's Date: 06/10/2012 Time: 7829-5621 SLP Time Calculation (min): 25 min  Assessment / Plan / Recommendation Clinical Impression  Mr. Coupe is making excellent progress toward his PMSV and dysphagia goals.  Pt. demonstrated donning and doffing valve independently using mirror on bedside table without cues.  Vocal intensity mildly decreased with min verbal cues for deep inhalation prior to verbalizing.  Observed pt. consume majority of vanilla Ensure pudding.  He recalled chin tuck position and performed with min verbal cues to position chin slightly further down toward chest.  No cough, throat clear or wet vocal quality present.  Continue Dys 1, no liquids with PMSV donned with all meals and full supervision (although he may be able to move to intermittent).  ST will follow for dysphagia and PMSV treatment.     Diet Recommendation  Continue with Current Diet: Dysphagia 1 (puree)    SLP Plan Continue with current plan of care   Pertinent Vitals/Pain none   Swallowing Goals  SLP Swallowing Goals Patient will utilize recommended strategies during swallow to increase swallowing safety with: Supervision/safety;Moderate assistance Swallow Study Goal #2 - Progress: Progressing toward goal Goal #3: Pt. will perform exercises for improved tongue base and laryngeal ROM and strength with max visual/verbal/tactile cues. Swallow Study Goal #3 - Progress: Progressing toward goal Goal #4: Pt. will consume trials of puree only with SLP for increased strength during treatment with max verbal/visual cues. Swallow Study Goal #4 - Progress: Met  General Temperature Spikes Noted: No Respiratory Status: Trach Behavior/Cognition: Alert;Cooperative;Pleasant mood Oral Cavity - Dentition: Poor condition Patient Positioning: Upright in bed (side of bed)  Oral Cavity - Oral  Hygiene Does patient have any of the following "at risk" factors?: Oxygen therapy - cannula, mask, simple oxygen devices Brush patient's teeth BID with toothbrush (using toothpaste with fluoride): Yes Patient is HIGH RISK - Oral Care Protocol followed (see row info): Yes Patient is AT RISK - Oral Care Protocol followed (see row info): Yes   Dysphagia Treatment Treatment focused on: Skilled observation of diet tolerance;Patient/family/caregiver education;Utilization of compensatory strategies Treatment Methods/Modalities: Skilled observation Patient observed directly with PO's: Yes Type of PO's observed: Dysphagia 1 (puree) Feeding: Able to feed self Type of cueing: Verbal Amount of cueing: Minimal       Royce Macadamia M.Ed [employer] [phone]  06/10/2012

## 2012-06-10 NOTE — Progress Notes (Signed)
Name: Cody [name]  MRN: [id number]  DOB: [birth year]  ADMISSION DATE: 05/29/2012   CONSULTATION DATE: 2/25 rehab, now admission 2/26  BRIEF PATIENT DESCRIPTION: 62 y/o M with PMH of COPD, GERD, HLD, HTN, CVA admitted 1/5-2/21 with VDRF secondary to influenza A and aspiration PNA. Required trach 1/22. Hospital course complicated by new PE, Bilateral DVT s/p IVC filter 1/27. 2/24 noted to have worsened LLL atx on cxr. 2/25 mild temp (100.4) and worsened cxr. PCCM consulted for concern of LLL PNA.   CULTURES:  2/25 Sputum >> MSSA 2/26 bronch BAL>>> negative Blood 2/27 >> neg 2/28 C diff >> NEG  ANTIBIOTICS:  Zosyn 2/25>  vanc 2/26 >>3/1  SIGNIFICANT EVENTS / STUDIES:  1/5-2/21 - Admit to [facility] for VDRF 2/2 flu, asp PNA s/p trach  2/26 retrach, bronch, distress, vent needed 2/27 off full support. ATC 2/28 back on to full support 2/28  Back to ATC 3/03 PEG pulled out, passed for D1, pudding thick liquid  3/04: ct chest >> Large cavitary left lower lung region consistent with empyema, increased ASD in lingula, mediastinal LAN  SUBJECTIVE:   Breathing about the same.  VITAL SIGNS: Temp:  [97.3 F (36.3 C)-98.9 F (37.2 C)] 98.6 F (37 C) (03/05 0509) Pulse Rate:  [88-116] 97 (03/05 0509) Resp:  [16-20] 17 (03/05 0509) BP: (116-144)/(72-97) 116/72 mmHg (03/05 0509) SpO2:  [92 %-99 %] 93 % (03/05 0509) FiO2 (%):  [35 %] 35 % (03/05 0509) Weight:  [82.6 kg (182 lb 1.6 oz)] 82.6 kg (182 lb 1.6 oz) (03/04 2257)  INTAKE / OUTPUT: Intake/Output     03/04 0701 - 03/05 0700 03/05 0701 - 03/06 0700   P.O. 120 120   I.V. (mL/kg)     IV Piggyback     Total Intake(mL/kg) 120 (1.5) 120 (1.5)   Urine (mL/kg/hr)     Stool     Total Output       Net +120 +120        Urine Occurrence 3 x 1 x   Stool Occurrence 1 x 1 x    PHYSICAL EXAMINATION:  General:  Chronically ill in NAD Neuro:  nonfocal HEENT:  Trach clean, weak but improved phonation with PMV Cardiovascular:  s1 s2  RRR Lungs:  Decreased in left  35% ATC Abdomen:  Soft, BS wnl Musculoskeletal:  No edema Skin:  No rash  Recent Labs Lab 06/08/12 0700 06/09/12 0640 06/10/12 0620  NA 154* 144 140  K 4.3 3.0* 3.1*  CL 110 102 100  CO2 37* 36* 34*  BUN 14 13 10   CREATININE 0.71 0.74 0.73  GLUCOSE 111* 118* 106*    Recent Labs Lab 06/07/12 0500 06/08/12 0700 06/10/12 0620  HGB 10.6* 11.1* 11.4*  HCT 33.8* 36.9* 35.5*  WBC 13.2* 12.5* 12.2*  PLT 331 366 423*   Lab Results  Component Value Date   INR 2.11* 06/10/2012   INR 3.84* 06/09/2012   INR 4.38* 06/09/2012   Imaging: Dg Chest 2 View  06/08/2012  *RADIOLOGY REPORT*  Clinical Data: Shortness of breath.  Pleural effusion.  CHEST - 2 VIEW  Comparison: Plain film chest 06/07/2012.  Findings: Tracheostomy tube is again noted.  Left pleural effusion and airspace disease persist but have improved.  Patchy airspace disease in the right lung base is also markedly improved.  On the lateral view, a short air fluid level is seen in the lower left hemithorax.  Heart size is normal.  IMPRESSION:  1.  Improved left pleural effusion and airspace disease.  Short air fluid level in the lower left hemithorax is compatible with cavitation. 2.  Improved right basilar airspace disease.   Original Report Authenticated By: Holley Dexter, M.D.    Ct Chest Wo Contrast  06/09/2012  *RADIOLOGY REPORT*  Clinical Data: Left lower lobe cavitation seen on comparison chest radiograph of 06/09/2002.  Further evaluation with CT.  CT CHEST WITHOUT CONTRAST  Technique:  Multidetector CT imaging of the chest was performed following the standard protocol without IV contrast.  Comparison: Chest radiograph 06/08/2012, CT 05/02/2038  Findings: Pain  There is a thick-walled cavitary lesion with air-fluid level in the left lower lobe measuring 10.5 x 6.1 by 10.1 cm. This is similar to the chest radiograph of 06/08/2012 and new from CT of 05/02/2012. This appears to be in the pleural space  and therefore likely represents a cavitary empyema rather than a pulmonary abscess. There is air space disease and atelectasis in the lingula which is increased compared to prior CT.  Right lung is relatively clear.  Tracheostomy tube is in good position.  There are enlarged mediastinal lymph nodes including a 17 mm precarinal lymph node and a 16 mm subcarinal lymph node.  These are likely reactive.  No pericardial fluid.  Limited view of the upper abdomen is unremarkable.  Limited view of the skeleton is unremarkable peri  IMPRESSION:  1.  Large cavitary lesion within the left lower lobe with air fluid level consistent empyema. 2.  Increased pneumonia within the lingula. 3.  Mediastinal lymphadenopathy is likely reactive. 4.  Tracheostomy tube appears in good position.   Original Report Authenticated By: Genevive Bi, M.D.    Dg Chest Port 1 View  06/10/2012  *RADIOLOGY REPORT*  Clinical Data: Follow up airspace disease  PORTABLE CHEST - 1 VIEW  Comparison: 06/09/2012  Findings: There is a tracheostomy tube with tip above the carina. Heart size is stable.  Pulmonary vein venous congestion is again identified.  There is a left pleural effusion present.  Left midlung and left base consolidation is not significantly changed from previous exam.  IMPRESSION:  1.  No change in left effusion. 2.  Persistent left midlung and left base consolidation.   Original Report Authenticated By: Signa Kell, M.D.    Dg Chest Port 1 View  06/09/2012  *RADIOLOGY REPORT*  Clinical Data: Evaluate left lower lobe air space disease  PORTABLE CHEST - 1 VIEW  Comparison: 06/08/2012; 06/07/2012; 06/01/2012; 05/02/2012; chest CT - 05/02/2012  Findings: Grossly unchanged cardiac silhouette and mediastinal contours with persistent obscuration of the left heart border secondary to grossly unchanged small left-sided pleural effusion and left mid and lower lung heterogeneous / consolidative opacities. Unchanged prominence of the central  pulmonary vasculature. Stable position of support apparatus.  Minimal worsening right basilar opacities have represent subsegmental atelectasis.  No definite pneumothorax. Presumed chondroid lesion within the proximal right humeral metaphysis, incompletely evaluated.  IMPRESSION: 1.  Stable positioning of support apparatus.  No pneumothorax. 2.  Grossly unchanged small left-sided pleural effusion and left mid and basilar heterogeneous / consolidative opacities worrisome for infection and/or aspiration. 3.  Worsening right basilar opacities favored to represent atelectasis. 4.  Presumed chondroid lesion within the right humeral metaphysis, incompletely evaluated.  Further evaluation with dedicated right humeral radiographs may be performed as clinically indicated.   Original Report Authenticated By: Tacey Ruiz, MD    Dg Swallowing Func-speech Pathology  06/08/2012  Breck Coons Litaker, CCC-SLP  06/08/2012  3:45 PM Objective Swallowing Evaluation: Modified Barium Swallowing Study   Patient Details  Name: TEREZ FREIMARK MRN: [id number] Date of Birth: Mar 02, 1951  Today's Date: 06/08/2012 Time: 4098-1191 SLP Time Calculation (min): 32 min  Past Medical History:  Past Medical History  Diagnosis Date  . Stroke   . Hypertension   . Hyperlipidemia   . GERD (gastroesophageal reflux disease)   . COPD (chronic obstructive pulmonary disease)    Past Surgical History:  Past Surgical History  Procedure Laterality Date  . Gastrostomy N/A 05/19/2012    Procedure: PEG Possible Open Gastrostomy;  Surgeon: Kandis Cocking, MD;  Location: [facility];  Service: General;  Laterality:  N/A;  . Video bronchoscopy Bilateral 06/03/2012    Procedure: VIDEO BRONCHOSCOPY WITHOUT FLUORO;  Surgeon: [name], MD;  Location: [facility] ENDOSCOPY;  Service:  Cardiopulmonary;  Laterality: Bilateral;   HPI:  Mr. Dave is a 62 year old white male with PMH of COPD, GERD,  HLD, HTN, 3 pack per day smoking history 20+ years, and HTN  presenting to the ED  1/5 with complaints of worsening shortness  of breath. History obtained by fiance. Diagnosed with potential  influenza with aspiration PNA (? during intubation). Intubated  due to respiratory distress 1/5. Extubated 1/15. Required trach  1/22. Hospital course complicated by new PE, Bilateral DVT s/p  IVC filter 1/27. 2/24 noted to have worsened LLL atx on cxr. 2/25  mild temp (100.4) and worsened cxr. PCCM consulted for concern of  LLL PNA. Patient had FEES 1/31 recommended NPO and MBS 2/14  recommended NPO with po trials. Was d/c to CIR and then  transferred to acute care due to respiratory distress 2/26.  Patient was downsized to size 6 cuffless trach 3/3.     Assessment / Plan / Recommendation Clinical Impression  Dysphagia Diagnosis: Mild oral phase dysphagia;Moderate  pharyngeal phase dysphagia Clinical impression: Swallow function has improved from study on  2/14. Patient presents with a moderate pharyngeal phase dysphagia  with a mild oral phase component. Size 6 cuffless trach was in  place during MBS with PMSV on. Oral phase was characterized by  lingual residue due to base of tongue weakness. Patient aspirated  on trials of thin liquids to below the level of the vocal cords  with no attempt to clear penetrates spontaneously and maintained  a mod-severe amount of residue in both the valelculae and the  pyriform sinuses due to decreased base of tongue retraction and  decreased laryngeal elevation. A chin tuck strategy was utilized  which proved effective in decreasing amount of residue in  vallecular and pyriform sinus space. SLP recommends patient  upgrade to dys-1 (puree) diet with NO liquids, meds crushed in  puree with full supervision and use of chin tuck. PMSV donned  during po's and mulitple swallows during meals.    Treatment Recommendation  Therapy as outlined in treatment plan below    Diet Recommendation Dysphagia 1 (Puree);No liquids;Pudding-thick  liquid   Medication Administration: Crushed with  puree Supervision: Patient able to self feed;Full supervision/cueing  for compensatory strategies Compensations: Slow rate;Small sips/bites;Multiple dry swallows  after each bite/sip Postural Changes and/or Swallow Maneuvers: Seated upright 90  degrees;Upright 30-60 min after meal;Chin tuck    Other  Recommendations Oral Care Recommendations: Oral care BID Other Recommendations: Order thickener from pharmacy;Place PMSV  during PO intake;Have oral suction available   Follow Up Recommendations  Inpatient Rehab    Frequency and Duration min 2x/week  2 weeks   Pertinent Vitals/Pain None reported    SLP Swallow Goals Patient will utilize recommended strategies during swallow to  increase swallowing safety with: Supervision/safety;Moderate  assistance Swallow Study Goal #2 - Progress: Progressing toward goal   General Date of Onset: 04/12/12 HPI: Mr. Ehle is a 62 year old white male with PMH of COPD,  GERD, HLD, HTN, 3 pack per day smoking history 20+ years, and HTN  presenting to the ED 1/5 with complaints of worsening shortness  of breath. History obtained by fiance. Diagnosed with potential  influenza with aspiration PNA (? during intubation). Intubated  due to respiratory distress 1/5. Extubated 1/15. Required trach  1/22. Hospital course complicated by new PE, Bilateral DVT s/p  IVC filter 1/27. 2/24 noted to have worsened LLL atx on cxr. 2/25  mild temp (100.4) and worsened cxr. PCCM consulted for concern of  LLL PNA. Patient had FEES 1/31 recommended NPO and MBS 2/14  recommended NPO with po trials. Was d/c to CIR and then  transferred to acute care due to respiratory distress 2/26.  Patient was downsized to size 6 cuffless trach 3/3. Type of Study: Modified Barium Swallowing Study Reason for Referral: Objectively evaluate swallowing function Previous Swallow Assessment: 05/22/12 MBS completed. Diet Prior to this Study: NPO;PEG tube (Patient accidentally  pulled PEG out) Temperature Spikes Noted: No Respiratory  Status: Trach Trach Size and Type: Uncuffed;#6;With PMSV in place History of Recent Intubation: No Behavior/Cognition: Cooperative;Alert;Pleasant mood Oral Cavity - Dentition: Poor condition Oral Motor / Sensory Function: Impaired - see Bedside swallow  eval Self-Feeding Abilities: Able to feed self;Needs assist Patient Positioning: Upright in chair Baseline Vocal Quality: Low vocal intensity;Hoarse Volitional Cough: Weak Volitional Swallow: Able to elicit    Reason for Referral Objectively evaluate swallowing function   Oral Phase Oral Preparation/Oral Phase Oral Phase: Impaired Oral - Nectar Oral - Nectar Teaspoon: Lingual/palatal residue;Reduced posterior  propulsion Oral - Nectar Cup: Lingual/palatal residue;Reduced posterior  propulsion Oral - Thin Oral - Thin Teaspoon: Lingual/palatal residue;Reduced posterior  propulsion Oral - Solids Oral - Puree: Lingual/palatal residue;Reduced posterior  propulsion Oral - Mechanical Soft: Lingual/palatal residue;Right pocketing  in lateral sulci;Weak lingual manipulation   Pharyngeal Phase Pharyngeal Phase Pharyngeal Phase: Impaired Pharyngeal - Honey Pharyngeal - Honey Cup: Not tested Pharyngeal - Nectar Pharyngeal - Nectar Teaspoon: Pharyngeal residue -  valleculae;Pharyngeal residue - pyriform sinuses;Delayed swallow  initiation;Premature spillage to valleculae;Reduced tongue base  retraction (Minimal delay in swallow initiation) Pharyngeal - Nectar Cup: Delayed swallow initiation;Premature  spillage to valleculae;Pharyngeal residue - valleculae;Pharyngeal  residue - pyriform sinuses;Reduced laryngeal elevation Pharyngeal - Thin Pharyngeal - Thin Teaspoon: Reduced tongue base  retraction;Reduced laryngeal elevation;Pharyngeal residue -  valleculae;Pharyngeal residue - pyriform  sinuses;Penetration/Aspiration before  swallow;Penetration/Aspiration during swallow;Delayed swallow  initiation;Premature spillage to valleculae Penetration/Aspiration details (thin teaspoon):  Material enters  airway, CONTACTS cords and not ejected out Pharyngeal - Thin Cup: Not tested Penetration/Aspiration details (thin cup): Material does not  enter airway Pharyngeal - Solids Pharyngeal - Puree: Premature spillage to valleculae;Delayed  swallow initiation;Reduced tongue base retraction;Pharyngeal  residue - valleculae;Pharyngeal residue - pyriform sinuses Penetration/Aspiration details (puree): Material does not enter  airway Pharyngeal - Mechanical Soft: Not tested  Cervical Esophageal Phase    GO   Berdine Dance SLP student           Berdine Dance 06/08/2012, 3:37 PM  Berdine Dance SLP student Berdine Dance 06/08/2012, 3:37 PM      ASSESSMENT / PLAN:  Acute respiratory failure: secondary to influenza A and aspiration PNA. PE/ bilat LE DVT: occurred in hospital Failure to wean: has persistent left sided airspace disease c/w PNA, ATX and some element of effusion.  s/p upsize trach LLL HCAP complicated L pleural effusion 3/2 >> see CT chest results from 3/04 P:   - ATC as tolerated since 2/28-->continue cuffless # 6 - PMV with SLP - see flowsheet re: abx - continue coumadin-->will transition to heparin in anticipation of procedure  - TCTS consulted   Hypernatremia P: -pulled PEG, continue D5 until sure he can take adequate fluids orally   Anemia>>stable P:  - f/u CBC  Dysphagia  Diarrhea X3-->improved  PEG dislodgement Looks like it was placed in Jan 2014 Passed swallow eval, hold off on feeding tube placement  P: Advance diet as tol.  - passed swallow eval for d1, pudding thick liquids - No indication for SUP  Insomnia -d/c ambien P: -trial trazodone for anxiety / sleep -caution with sedating medications  Global:  Plan for SNF  Reviewed above, examined pt, and agree with assessment/plan.  He has CT chest findings worrisome for complicated parapneumonic effusion/empyema.  Will ask TCTS to evaluate.  Concerned he is too weak to tolerate  surgery.  May need IR to place drain instead >> defer until after TCTS evaluation.  Hold coumadin and use heparin when INR < 2, until thoracic procedures are completed.    Will need to defer SNF placement for now.  Coralyn Helling, MD [facility] Pulmonary/Critical Care 06/10/2012, 1:17 PM Pager:  [phone] After 3pm call: [phone]

## 2012-06-10 NOTE — Progress Notes (Signed)
Utilization review completed.  

## 2012-06-10 NOTE — Progress Notes (Signed)
Pt had 9 beat run of SVT reported by cardiac central monitoring. HR 124>120. Pt asymptomatic and is resting in room with eyes closed, no distress or pain noted. MD notified. No no orders at this time. Will continue to monitor.

## 2012-06-10 NOTE — Progress Notes (Signed)
Procedure(s) (LRB): VIDEO ASSISTED THORACOSCOPY (VATS)/DECORTICATION (Left) EMPYEMA DRAINAGE (Left) Subjective: Left Empyema following 2 month hospitalization, VDRF and trach  Objective:gVital signs in last 24 hours: Temp:  [97.7 F (36.5 C)-98.6 F (37 C)] 97.8 F (36.6 C) (03/05 1700) Pulse Rate:  [87-114] 87 (03/05 1700) Cardiac Rhythm:  [-] Normal sinus rhythm (03/05 0744) Resp:  [16-20] 20 (03/05 1700) BP: (116-153)/(72-96) 132/80 mmHg (03/05 1700) SpO2:  [92 %-98 %] 98 % (03/05 1700) FiO2 (%):  [35 %] 35 % (03/05 1700) Weight:  [182 lb 1.6 oz (82.6 kg)] 182 lb 1.6 oz (82.6 kg) (03/04 2257)  Hemodynamic parameters for last 24 hours:  nsr afebrile  Intake/Output from previous day: 03/04 0701 - 03/05 0700 In: 120 [P.O.:120] Out: -  Intake/Output this shift:    Trach in place Chronically ill  Dec breath sounds L base  Lab Results:  Recent Labs  06/08/12 0700 06/10/12 0620  WBC 12.5* 12.2*  HGB 11.1* 11.4*  HCT 36.9* 35.5*  PLT 366 423*   BMET:  Recent Labs  06/09/12 0640 06/10/12 0620  NA 144 140  K 3.0* 3.1*  CL 102 100  CO2 36* 34*  GLUCOSE 118* 106*  BUN 13 10  CREATININE 0.74 0.73  CALCIUM 8.8 8.9    PT/INR:  Recent Labs  06/10/12 1755  LABPROT 20.5*  INR 1.83*   ABG    Component Value Date/Time   PHART 7.485* 06/04/2012 0139   HCO3 37.1* 06/04/2012 0139   TCO2 39 06/04/2012 0139   ACIDBASEDEF 1.0 04/12/2012 2013   O2SAT 96.0 06/04/2012 0139   CBG (last 3)  No results found for this basename: GLUCAP,  in the last 72 hours  Assessment/Plan: S/P Procedure(s) (LRB): VIDEO ASSISTED THORACOSCOPY (VATS)/DECORTICATION (Left) EMPYEMA DRAINAGE (Left) Pec IR placement of chest tube with CT guidance- if that fails then consider formal L VATS   LOS: 7 days    VAN TRIGT III,PETER 06/10/2012

## 2012-06-11 ENCOUNTER — Inpatient Hospital Stay (HOSPITAL_COMMUNITY): Payer: Medicaid Other

## 2012-06-11 LAB — LACTATE DEHYDROGENASE, PLEURAL OR PERITONEAL FLUID

## 2012-06-11 LAB — CBC
MCHC: 32.5 g/dL (ref 30.0–36.0)
MCV: 89.6 fL (ref 78.0–100.0)
Platelets: 351 10*3/uL (ref 150–400)
RDW: 15.6 % — ABNORMAL HIGH (ref 11.5–15.5)
WBC: 11.1 10*3/uL — ABNORMAL HIGH (ref 4.0–10.5)

## 2012-06-11 LAB — HEPARIN LEVEL (UNFRACTIONATED): Heparin Unfractionated: <0.1 IU/mL — ABNORMAL LOW (ref 0.30–0.70)

## 2012-06-11 LAB — PROTIME-INR: INR: 1.58 — ABNORMAL HIGH (ref 0.00–1.49)

## 2012-06-11 LAB — BODY FLUID CELL COUNT WITH DIFFERENTIAL: Neutrophil Count, Fluid: 79 % — ABNORMAL HIGH (ref 0–25)

## 2012-06-11 LAB — BASIC METABOLIC PANEL
Calcium: 8.5 mg/dL (ref 8.4–10.5)
Creatinine, Ser: 0.69 mg/dL (ref 0.50–1.35)
GFR calc non Af Amer: >90 mL/min (ref >90)
Sodium: 136 mEq/L (ref 135–145)

## 2012-06-11 LAB — CHOLESTEROL, TOTAL: Cholesterol: 134 mg/dL (ref 0–200)

## 2012-06-11 LAB — LACTATE DEHYDROGENASE: LDH: 196 U/L (ref 94–250)

## 2012-06-11 LAB — PROTEIN, TOTAL: Total Protein: 6.8 g/dL (ref 6.0–8.3)

## 2012-06-11 MED ORDER — MIDAZOLAM HCL 2 MG/2ML IJ SOLN
INTRAMUSCULAR | Status: AC | PRN
  Administered 2012-06-11 (×2): 2 mg via INTRAVENOUS

## 2012-06-11 MED ORDER — ADULT MULTIVITAMIN W/MINERALS CH
1.0000 | ORAL_TABLET | Freq: Every day | ORAL | Status: DC
  Administered 2012-06-11 – 2012-06-18 (×8): 1 via ORAL
  Filled 2012-06-11 (×8): qty 1

## 2012-06-11 MED ORDER — HEPARIN (PORCINE) IN NACL 100-0.45 UNIT/ML-% IJ SOLN
2100.0000 [IU]/h | INTRAMUSCULAR | Status: DC
  Filled 2012-06-11 (×2): qty 250

## 2012-06-11 MED ORDER — HEPARIN (PORCINE) IN NACL 100-0.45 UNIT/ML-% IJ SOLN
2450.0000 [IU]/h | INTRAMUSCULAR | Status: DC
  Administered 2012-06-11: 2100 [IU]/h via INTRAVENOUS
  Administered 2012-06-12 (×2): 2300 [IU]/h via INTRAVENOUS
  Administered 2012-06-13: 2450 [IU]/h via INTRAVENOUS
  Filled 2012-06-11 (×8): qty 250

## 2012-06-11 MED ORDER — MIDAZOLAM HCL 2 MG/2ML IJ SOLN
INTRAMUSCULAR | Status: AC
  Filled 2012-06-11: qty 2

## 2012-06-11 MED ORDER — PRO-STAT SUGAR FREE PO LIQD
30.0000 mL | Freq: Two times a day (BID) | ORAL | Status: DC
  Administered 2012-06-12 – 2012-06-17 (×7): 30 mL via ORAL
  Filled 2012-06-11 (×14): qty 30

## 2012-06-11 MED ORDER — FENTANYL CITRATE 0.05 MG/ML IJ SOLN
INTRAMUSCULAR | Status: AC | PRN
  Administered 2012-06-11 (×2): 50 ug via INTRAVENOUS

## 2012-06-11 NOTE — Clinical Social Work Placement (Addendum)
Clinical Social Work Department CLINICAL SOCIAL WORK PLACEMENT NOTE 06/11/2012  Patient:  Cody [name], Cody [name]  Account Number:  [id number] Admit date:  06/03/2012  Clinical Social Worker:  Genelle Bal, LCSW  Date/time:  06/11/2012 08:34 AM  Clinical Social Work is seeking post-discharge placement for this patient at the following level of care:   SKILLED NURSING   (*CSW will update this form in Epic as items are completed)   06/08/2012  Patient/family provided with [facility] Department of Clinical Social Works list of facilities offering this level of care within the geographic area requested by the patient (or if unable, by the patients family).  06/08/2012  Patient/family informed of their freedom to choose among providers that offer the needed level of care, that participate in Medicare, Medicaid or managed care program needed by the patient, have an available bed and are willing to accept the patient.    Patient/family informed of [facility] ownership interest in [facility], as well as of the fact that they are under no obligation to receive care at this facility.  PASARR submitted to EDS on 05/04/2012 PASARR number received from EDS on 05/04/2012  FL2 transmitted to all facilities in geographic area requested by pt/family on  06/08/2012 FL2 transmitted to all facilities within larger geographic area on   Patient informed that his/her managed care company has contracts with or will negotiate with  certain facilities, including the following:     Patient/family informed of bed offers received:   Patient chooses bed at [facility] Physician recommends and patient chooses bed at    Patient to be transferred to [facility] on  06/18/2012 Patient to be transferred to facility by ambulance  The following physician request were entered in Epic:    Additional Comments: 06/18/2012: Wife [name] was contacted regarding discharge and will assist in completing paperwork  at the facility.  Fernande Boyden, Social Work Intern

## 2012-06-11 NOTE — Progress Notes (Signed)
NUTRITION FOLLOW UP  DOCUMENTATION CODES  Per approved criteria   -Severe malnutrition in the context of acute illness or injury   Intervention:   1. MVI daily 2. Add 30 ml Prostat po BID to better meet protein needs, continue to encourage Ensure Pudding and Magic Cups on trays 3. RD to continue to follow nutrition care plan  Nutrition Dx:   Increased nutrient needs related to PNA, acute illness as evidenced by estimated nutrition needs. Ongoing.   Goal:   Intake to meet >/= 90% of estimated nutrition needs. Unmet.  Monitor:   weight, labs, I/O's, po intake, supplement acceptance  Assessment:   Continues to work with SLP for dysphagia and PMSV treatments. Noted SLP stating that pt is making excellent progress towards his goals. Continues with Dysphagia 1 diet with pudding-thickened liquids.  Underwent IR placement of catheter into L pleural space 2/2 worsening empyema on 3/6. Planning for CT-guided L chest empyema drainage today. Per MD, if pt fails this, then consider formal L VATS. Pt is currently NPO for procedure, continues with order for Ensure Pudding TID. Consumed 100% of dinner last evening.  Height: Ht Readings from Last 1 Encounters:  06/08/12 5\' 8"  (1.727 m)    Weight Status:   Wt Readings from Last 1 Encounters:  06/09/12 182 lb 1.6 oz (82.6 kg)  Wt trending down  Re-estimated needs:  Kcal: 2000 - 2200 Protein: 125 - 135 grams Fluid: 2 - 2.2  Skin: Stage I pressure ulcer to sacrum; abdominal incision  Diet Order: NPO    Intake/Output Summary (Last 24 hours) at 06/11/12 1152 Last data filed at 06/10/12 1858  Gross per 24 hour  Intake 938.75 ml  Output      0 ml  Net 938.75 ml    Last BM: 3/4 (diarrhea)   Labs:   Recent Labs Lab 06/05/12 0844  06/06/12 0430 06/07/12 0500 06/08/12 0700 06/09/12 0640 06/10/12 0620 06/11/12 0257  NA  --   < > 143 144 154* 144 140 136  K  --   < > 2.9* 2.9* 4.3 3.0* 3.1* 3.4*  CL  --   < > 101 102 110 102  100 98  CO2  --   < > 34* 33* 37* 36* 34* 32  BUN  --   < > 14 17 14 13 10 8   CREATININE  --   < > 0.74 0.70 0.71 0.74 0.73 0.69  CALCIUM  --   < > 9.1 9.1 9.9 8.8 8.9 8.5  MG 1.7  --   --  1.9  --   --   --   --   PHOS  --   --  2.8 3.0 3.6  --   --   --   GLUCOSE  --   < > 136* 136* 111* 118* 106* 110*  < > = values in this interval not displayed.  CBG (last 3)  No results found for this basename: GLUCAP,  in the last 72 hours  Scheduled Meds: . antiseptic oral rinse  1 application Mouth Rinse QID  . chlorhexidine  15 mL Mouth/Throat BID  . feeding supplement  1 Container Oral TID BM  . piperacillin-tazobactam (ZOSYN)  IV  3.375 g Intravenous Q8H  . traZODone  50 mg Oral QHS    Continuous Infusions: . dextrose 75 mL/hr at 06/11/12 0052  . heparin 2,100 Units/hr (06/11/12 1120)    Jarold Motto MS, RD, LDN Pager: [phone] After-hours pager: [phone]

## 2012-06-11 NOTE — Progress Notes (Signed)
ANTICOAGULATION CONSULT NOTE - Follow Up Consult  Pharmacy Consult for heparin Indication: h/o PE/DVT  Labs:  Recent Labs  06/08/12 0700 06/09/12 0640  06/10/12 0620 06/10/12 1755 06/11/12 0257  HGB 11.1*  --   --  11.4*  --  10.1*  HCT 36.9*  --   --  35.5*  --  31.1*  PLT 366  --   --  423*  --  351  LABPROT 29.3* 39.1*  < > 22.8* 20.5* 18.4*  INR 2.96* 4.38*  < > 2.11* 1.83* 1.58*  HEPARINUNFRC  --   --   --   --   --  <0.10*  CREATININE 0.71 0.74  --  0.73  --   --   < > = values in this interval not displayed.   Assessment: 62yo male undetectable on heparin with initial dosing for bridging.  Goal of Therapy:  Heparin level 0.3-0.7 units/ml   Plan:  Will increase heparin gtt by 4 units/kg/hr to 1600 units/hr and check level in 6hr.  Vernard Gambles, PharmD, BCPS  06/11/2012,3:52 AM

## 2012-06-11 NOTE — Care Management Note (Signed)
   CARE MANAGEMENT NOTE 06/11/2012  Patient:  Cody [name], Cody [name]   Account Number:  [id number]  Date Initiated:  06/04/2012  Documentation initiated by:  [facility]  Subjective/Objective Assessment:   Readmitted from CIR.  retrached and vented.  has wife.     Action/Plan:   3/6 Plan for placement of chest tube to lt chest for lesion.   Anticipated DC Date:  06/11/2012   Anticipated DC Plan:  SKILLED NURSING FACILITY  In-house referral  Clinical Social Worker      DC Planning Services  CM consult      Choice offered to / List presented to:             Status of service:  Completed, signed off Medicare Important Message given?   (If response is "NO", the following Medicare IM given date fields will be blank) Date Medicare IM given:   Date Additional Medicare IM given:    Discharge Disposition:  SKILLED NURSING FACILITY  Per UR Regulation:  Reviewed for med. necessity/level of care/duration of stay  If discussed at Long Length of Stay Meetings, dates discussed:   06/09/2012    Comments:  Contact:  Contact:  Derrel Nip [phone] 06/11/2012 Pt no longer CIR candidate, will need SNF placement , CSW following and working with this pt for this services.  CRoyal RN MPH, [phone]   06/09/2012  [address] RN, [place] [phone] Patient may consider SNF at discharge.  06-04-12 - 11am [name], RNBSN [phone] admitted from inpt rehab.  retrached and vented.  discussed case with SW - CM will follow for continued needs.

## 2012-06-11 NOTE — Procedures (Signed)
Procedure:  CT guided left chest tube thoracostomy Findings:  Grossly purulent fluid in posterior left empyema.  90 mL sent for requested lab testing.  14 Fr pigtail drain placed and connected to [employer].

## 2012-06-11 NOTE — Progress Notes (Signed)
ANTICOAGULATION CONSULT NOTE - Follow Up Consult  Pharmacy Consult for Heparin bridge while Warfarin on hold Indication: Hx recent PE/DVT (Jan '14)  Allergies  Allergen Reactions  . Protamine Anaphylaxis    Patient Measurements: Height: 5\' 8"  (172.7 cm) Weight: 182 lb 1.6 oz (82.6 kg) IBW/kg (Calculated) : 68.4 Heparin Dosing Weight: 82.6 kg  Vital Signs: Temp: 97.8 F (36.6 C) (03/06 0842) Temp src: Oral (03/06 0842) BP: 148/92 mmHg (03/06 0842) Pulse Rate: 104 (03/06 0842)  Labs:  Recent Labs  06/09/12 0640  06/10/12 0620 06/10/12 1755 06/11/12 0257 06/11/12 0905  HGB  --   --  11.4*  --  10.1*  --   HCT  --   --  35.5*  --  31.1*  --   PLT  --   --  423*  --  351  --   LABPROT 39.1*  < > 22.8* 20.5* 18.4*  --   INR 4.38*  < > 2.11* 1.83* 1.58*  --   HEPARINUNFRC  --   --   --   --  <0.10* <0.10*  CREATININE 0.74  --  0.73  --  0.69  --   < > = values in this interval not displayed.  Estimated Creatinine Clearance: 97.8 ml/min (by C-G formula based on Cr of 0.69).   Assessment: 62 y.o M on heparin bridge while warfarin on hold for hx recent DVT/PE in Jan '14. The patient is going for CT guided left chest empyema drainage today around 1400. If this fails, the patient will need VATS. Heparin level this morning remains undetectable (HL <0.1, goal of 0.3-0.7). The patient has been known to require high heparin drip rates in the past ranging from 23000-25000 units/hr (23-25 ml/hr). Will be more aggressive with rate increase with this in mind.  It is noted that the heparin drip is to be turned off around 1200 today (2 hours prior to procedure). The nurse is aware of this -- will keep in close communication to f/u when heparin is to be resumed post-procedure in order to enter an appropriate follow-up heparin level.  Goal of Therapy:  Heparin level 0.3-0.7 units/ml Monitor platelets by anticoagulation protocol: Yes   Plan:  1. Continue holding warfarin 2. Increase  heparin drip rate to 2100 units/hr (21 ml/hr) 3. Will continue to monitor for any signs/symptoms of bleeding and will follow up with heparin level 8 hours after drip resumed post-procedure.  Georgina Pillion, PharmD, BCPS Clinical Pharmacist Pager: [phone] 06/11/2012 11:09 AM

## 2012-06-11 NOTE — Progress Notes (Signed)
Name: Cody [name]  MRN: [id number]  DOB: [birth year]  ADMISSION DATE: 05/29/2012   CONSULTATION DATE: 2/25 rehab, now admission 2/26  BRIEF PATIENT DESCRIPTION: 62 y/o M with PMH of COPD, GERD, HLD, HTN, CVA admitted 1/5-2/21 with VDRF secondary to influenza A and aspiration PNA. Required trach 1/22. Hospital course complicated by new PE, Bilateral DVT s/p IVC filter 1/27. 2/24 noted to have worsened LLL atx on cxr. 2/25 mild temp (100.4) and worsened cxr. PCCM consulted for concern of LLL PNA.   CULTURES:  2/25 Sputum >> MSSA 2/26 bronch BAL>>> negative Blood 2/27 >> neg 2/28 C diff >> NEG  ANTIBIOTICS:  Zosyn 2/25>  vanc 2/26 >>3/1  SIGNIFICANT EVENTS / STUDIES:  1/5-2/21 - Admit to [facility] for VDRF 2/2 flu, asp PNA s/p trach  2/26 retrach, bronch, distress, vent needed 2/27 off full support. ATC 2/28 back on to full support 2/28  Back to ATC 3/03 PEG pulled out, passed for D1, pudding thick liquid  3/04: ct chest >> Large cavitary left lower lung region consistent with empyema, increased ASD in lingula, mediastinal LAN 3/05: Consulted TCTS 3/06: IR placed catheter Lt pleural space  SUBJECTIVE:   Breathing about the same.no distress  VITAL SIGNS: Temp:  [97.4 F (36.3 C)-98.5 F (36.9 C)] 97.8 F (36.6 C) (03/06 0842) Pulse Rate:  [78-104] 104 (03/06 0842) Resp:  [18-20] 20 (03/06 0842) BP: (127-148)/(74-92) 148/92 mmHg (03/06 0842) SpO2:  [93 %-98 %] 94 % (03/06 0842) FiO2 (%):  [35 %] 35 % (03/06 0842)  INTAKE / OUTPUT: Intake/Output     03/05 0701 - 03/06 0700 03/06 0701 - 03/07 0700   P.O. 480    I.V. (mL/kg) 578.8 (7)    Total Intake(mL/kg) 1058.8 (12.8)    Net +1058.8          Urine Occurrence 6 x    Stool Occurrence 6 x     PHYSICAL EXAMINATION:  General:  Chronically ill in NAD Neuro:  nonfocal HEENT:  Trach clean, weak but improved phonation with PMV Cardiovascular:  s1 s2 RRR Lungs:  Decreased in left  35% ATC Abdomen:  Soft, BS  wnl Musculoskeletal:  No edema Skin:  No rash  Recent Labs Lab 06/09/12 0640 06/10/12 0620 06/11/12 0257  NA 144 140 136  K 3.0* 3.1* 3.4*  CL 102 100 98  CO2 36* 34* 32  BUN 13 10 8   CREATININE 0.74 0.73 0.69  GLUCOSE 118* 106* 110*    Recent Labs Lab 06/08/12 0700 06/10/12 0620 06/11/12 0257  HGB 11.1* 11.4* 10.1*  HCT 36.9* 35.5* 31.1*  WBC 12.5* 12.2* 11.1*  PLT 366 423* 351   Lab Results  Component Value Date   INR 1.58* 06/11/2012   INR 1.83* 06/10/2012   INR 2.11* 06/10/2012   Imaging: Ct Chest Wo Contrast  06/09/2012  *RADIOLOGY REPORT*  Clinical Data: Left lower lobe cavitation seen on comparison chest radiograph of 06/09/2002.  Further evaluation with CT.  CT CHEST WITHOUT CONTRAST  Technique:  Multidetector CT imaging of the chest was performed following the standard protocol without IV contrast.  Comparison: Chest radiograph 06/08/2012, CT 05/02/2038  Findings: Pain  There is a thick-walled cavitary lesion with air-fluid level in the left lower lobe measuring 10.5 x 6.1 by 10.1 cm. This is similar to the chest radiograph of 06/08/2012 and new from CT of 05/02/2012. This appears to be in the pleural space and therefore likely represents a cavitary empyema rather than a pulmonary abscess.  There is air space disease and atelectasis in the lingula which is increased compared to prior CT.  Right lung is relatively clear.  Tracheostomy tube is in good position.  There are enlarged mediastinal lymph nodes including a 17 mm precarinal lymph node and a 16 mm subcarinal lymph node.  These are likely reactive.  No pericardial fluid.  Limited view of the upper abdomen is unremarkable.  Limited view of the skeleton is unremarkable peri  IMPRESSION:  1.  Large cavitary lesion within the left lower lobe with air fluid level consistent empyema. 2.  Increased pneumonia within the lingula. 3.  Mediastinal lymphadenopathy is likely reactive. 4.  Tracheostomy tube appears in good position.    Original Report Authenticated By: Genevive Bi, M.D.    Dg Chest Port 1 View  06/10/2012  *RADIOLOGY REPORT*  Clinical Data: Follow up airspace disease  PORTABLE CHEST - 1 VIEW  Comparison: 06/09/2012  Findings: There is a tracheostomy tube with tip above the carina. Heart size is stable.  Pulmonary vein venous congestion is again identified.  There is a left pleural effusion present.  Left midlung and left base consolidation is not significantly changed from previous exam.  IMPRESSION:  1.  No change in left effusion. 2.  Persistent left midlung and left base consolidation.   Original Report Authenticated By: Signa Kell, M.D.     ASSESSMENT / PLAN:  Acute respiratory failure: secondary to influenza A and aspiration PNA. PE/ bilat LE DVT: occurred in hospital Failure to wean: has persistent left sided airspace disease c/w PNA, ATX and some element of effusion.  s/p upsize trach LLL HCAP complicated L pleural effusion 3/2 >> see CT chest results from 3/04. He's now been seen by thoracic surgery the plan for this is CT-guided chest tube placement, if no improvement with this consideration for a video-assisted thoracotomy. P:   - ATC as tolerated since 2/28-->continue cuffless # 6 - PMV with SLP - see flowsheet re: abx - heparin per pharmacy, resume Coumadin after all surgical therapy regarding left pleural effusion complete  - for CT guidance chest tube 3-6   Anemia>>stable P:  - f/u CBC  Dysphagia  Diarrhea X3-->improved  PEG dislodgement Speech-language therapy is following for dysphasia and Passy-Muir valve training. His voice quality continues to improve, he is now on a dysphagia 1 diet, with no liquids. He's tolerating this well.  P: - Advance diet as tol.  - No indication for SUP  Insomnia Seems to be doing better with trazodone P: -continue trazodone at at bedtime -caution with sedating medications  Global:  Plan for SNF, however complicated pleural effusion needs  definitive treatment first.  Reviewed above, examined pt, and agree with assessment/plan.  Appreciate help from TCTS and IR.  He will have CT guided catheter placed in Lt pleural space later today.  Will send fluid for analysis.  Continue Abx for now.  Hopefully he can avoid need for VATS.  Coralyn Helling, MD [facility] Pulmonary/Critical Care 06/11/2012, 1:11 PM Pager:  [phone] After 3pm call: [phone]

## 2012-06-11 NOTE — Progress Notes (Signed)
Agree.  For left empyema drain placement in CT later today.

## 2012-06-11 NOTE — Clinical Social Work Note (Signed)
CSW reviewed patient's bed offer and [name] is only offer. CSW talked with CSW Chiropodist regarding LOG to facility and was advised that we will pay for room/board and therapies as patient is Medicaid pending. CSW talked with Marylene Land in admissions at [facility] regarding LOG.  CSW will continue to monitor patient's progress and assist with d/c to SNF when medically stable.

## 2012-06-11 NOTE — Progress Notes (Signed)
Patient ID: Cody [name], male   DOB: 02-09-1951, 62 y.o.   MRN: [id number] Request received for CT guided placement of a left chest drain on pt with hx of empyema. Pt also with hx of VDRF secondary to influenza A, aspiration PNA requiring trach on 1/22. He is currently on IV heparin for new PE and bilateral DVT with recent IVC filter placement. Recent CT reveals findings concerning for left chest empyema/worsening LLL consolidation. Imaging studies have been reviewed by Dr. Fredia Sorrow. Additional PMH as below.  Exam: pt awake/alert;intact trach;  chest- dim BS left base; heart- RRR; abd- soft,+bs,nt; ext- FROM, L>R LE edema. Filed Vitals:   06/11/12 0337 06/11/12 0609 06/11/12 0815 06/11/12 0842  BP:  144/74  148/92  Pulse: 78 93 104 104  Temp:  97.4 F (36.3 C)  97.8 F (36.6 C)  TempSrc:  Oral  Oral  Resp: 18 20 20 20   Height:      Weight:      SpO2: 96% 95% 93% 94%   Past Medical History  Diagnosis Date  . Stroke   . Hypertension   . Hyperlipidemia   . GERD (gastroesophageal reflux disease)   . COPD (chronic obstructive pulmonary disease)    Past Surgical History  Procedure Laterality Date  . Gastrostomy N/A 05/19/2012    Procedure: PEG Possible Open Gastrostomy;  Surgeon: Kandis Cocking, MD;  Location: [facility] OR;  Service: General;  Laterality: N/A;  . Video bronchoscopy Bilateral 06/03/2012    Procedure: VIDEO BRONCHOSCOPY WITHOUT FLUORO;  Surgeon: [name], MD;  Location: [facility] ENDOSCOPY;  Service: Cardiopulmonary;  Laterality: Bilateral;   Dg Chest 2 View  06/08/2012  *RADIOLOGY REPORT*  Clinical Data: Shortness of breath.  Pleural effusion.  CHEST - 2 VIEW  Comparison: Plain film chest 06/07/2012.  Findings: Tracheostomy tube is again noted.  Left pleural effusion and airspace disease persist but have improved.  Patchy airspace disease in the right lung base is also markedly improved.  On the lateral view, a short air fluid level is seen in the lower left hemithorax.  Heart  size is normal.  IMPRESSION:  1.  Improved left pleural effusion and airspace disease.  Short air fluid level in the lower left hemithorax is compatible with cavitation. 2.  Improved right basilar airspace disease.   Original Report Authenticated By: Holley Dexter, M.D.    Dg Chest 2 View  06/01/2012  *RADIOLOGY REPORT*  Clinical Data: Respiratory distress.  Tracheostomy.  CHEST - 2 VIEW  Comparison: 05/26/2012.  Findings: Tracheostomy appears unchanged compared to prior. Increasing opacity at the left lung base compatible with airspace disease and atelectasis.  Right lung base appears little changed allowing for technique.  The cardiopericardial silhouette and mediastinal contours are unchanged.  IVC filter incidentally noted on the lateral view.  IMPRESSION: Worsening airspace disease and atelectasis at the left lung base. Unchanged tracheostomy.   Original Report Authenticated By: Andreas Newport, M.D.    Ct Chest Wo Contrast  06/09/2012  *RADIOLOGY REPORT*  Clinical Data: Left lower lobe cavitation seen on comparison chest radiograph of 06/09/2002.  Further evaluation with CT.  CT CHEST WITHOUT CONTRAST  Technique:  Multidetector CT imaging of the chest was performed following the standard protocol without IV contrast.  Comparison: Chest radiograph 06/08/2012, CT 05/02/2038  Findings: Pain  There is a thick-walled cavitary lesion with air-fluid level in the left lower lobe measuring 10.5 x 6.1 by 10.1 cm. This is similar to the chest radiograph of 06/08/2012 and  new from CT of 05/02/2012. This appears to be in the pleural space and therefore likely represents a cavitary empyema rather than a pulmonary abscess. There is air space disease and atelectasis in the lingula which is increased compared to prior CT.  Right lung is relatively clear.  Tracheostomy tube is in good position.  There are enlarged mediastinal lymph nodes including a 17 mm precarinal lymph node and a 16 mm subcarinal lymph node.  These  are likely reactive.  No pericardial fluid.  Limited view of the upper abdomen is unremarkable.  Limited view of the skeleton is unremarkable peri  IMPRESSION:  1.  Large cavitary lesion within the left lower lobe with air fluid level consistent empyema. 2.  Increased pneumonia within the lingula. 3.  Mediastinal lymphadenopathy is likely reactive. 4.  Tracheostomy tube appears in good position.   Original Report Authenticated By: Genevive Bi, M.D.    Ir Fluoro Rm 30-60 Min  05/15/2012  *RADIOLOGY REPORT*  Clinical Data: Percutaneous gastrostomy tube placement  IR FLOURO RM 0-60 MIN  Comparison: Chest CT - 05/02/2012  Intravenous medications:  Fentanyl 50 mcg IV; Versed 2 mg IV; Glucagon 0.5 mg IV  Fluoroscopy time:  2.7 minutes  Complications:  None immediate  Findings:  Preprocedural spot fluoroscopic image demonstrates interval transit of enteric contrast with opacification of the transverse colon. Ultrasound scanning was utilized to demarcate the edge of the left lobe of the liver  A timeout was performed prior to the initiation of the procedure. A small amount of Fentanyl and Versed was administered secondary to patient agitation on the fluoroscopy table.  A Kumpe catheter was advanced to the level of the stomach under intermittent fluoroscopic guidance. After the administration of 0.5 mg of glucagon intravenously, the stomach was insufflated with air.  Despite angling the fluoroscopy table in multiple obliquities, an adequate window for safe percutaneous gastrostomy tube placement could not be obtained secondary to the colon overlying the anterior aspect of the stomach.  As such, the procedure was cancelled.  IMPRESSION: Cancelled a percutaneous gastrostomy tube placement secondary to lack of fluoroscopic window.  Surgical consultation for enteric tube placement is recommended.  Above findings discussed with pt's nurse, Dedra Skeens, RN at [phone].   Original Report Authenticated By: Tacey Ruiz, MD    Dg Chest  Port 1 View  06/10/2012  *RADIOLOGY REPORT*  Clinical Data: Follow up airspace disease  PORTABLE CHEST - 1 VIEW  Comparison: 06/09/2012  Findings: There is a tracheostomy tube with tip above the carina. Heart size is stable.  Pulmonary vein venous congestion is again identified.  There is a left pleural effusion present.  Left midlung and left base consolidation is not significantly changed from previous exam.  IMPRESSION:  1.  No change in left effusion. 2.  Persistent left midlung and left base consolidation.   Original Report Authenticated By: Signa Kell, M.D.    Dg Chest Port 1 View  06/09/2012  *RADIOLOGY REPORT*  Clinical Data: Evaluate left lower lobe air space disease  PORTABLE CHEST - 1 VIEW  Comparison: 06/08/2012; 06/07/2012; 06/01/2012; 05/02/2012; chest CT - 05/02/2012  Findings: Grossly unchanged cardiac silhouette and mediastinal contours with persistent obscuration of the left heart border secondary to grossly unchanged small left-sided pleural effusion and left mid and lower lung heterogeneous / consolidative opacities. Unchanged prominence of the central pulmonary vasculature. Stable position of support apparatus.  Minimal worsening right basilar opacities have represent subsegmental atelectasis.  No definite pneumothorax. Presumed chondroid lesion within the  proximal right humeral metaphysis, incompletely evaluated.  IMPRESSION: 1.  Stable positioning of support apparatus.  No pneumothorax. 2.  Grossly unchanged small left-sided pleural effusion and left mid and basilar heterogeneous / consolidative opacities worrisome for infection and/or aspiration. 3.  Worsening right basilar opacities favored to represent atelectasis. 4.  Presumed chondroid lesion within the right humeral metaphysis, incompletely evaluated.  Further evaluation with dedicated right humeral radiographs may be performed as clinically indicated.   Original Report Authenticated By: Tacey Ruiz, MD    Dg Chest Port 1  View  06/07/2012  *RADIOLOGY REPORT*  Clinical Data: Tracheostomy.  PORTABLE CHEST - 1 VIEW  Comparison: 06/05/2012  Findings: Tracheostomy tube is unchanged.  Worsening aeration of the left lung with increasing left lung airspace disease.  Moderate left effusion, also likely increased.  Minimal right base atelectasis or scarring.  Heart is upper limits normal in size.  IMPRESSION: Worsening left lung airspace disease and left effusion.   Original Report Authenticated By: Charlett Nose, M.D.    Dg Chest Port 1 View  06/05/2012  *RADIOLOGY REPORT*  Clinical Data: Respiratory failure  PORTABLE CHEST - 1 VIEW  Comparison: Chest radiograph 06/04/2012  Findings: Tracheostomy tube unchanged in position.  Stable cardiac silhouette.  There is left lower lobe opacity representing atelectasis and air space disease.  Right lung relatively clear.  IMPRESSION:  1.  No significant change. 2.  Left lower lobe atelectasis and air space disease.   Original Report Authenticated By: Genevive Bi, M.D.    Dg Chest Port 1 View  06/04/2012  *RADIOLOGY REPORT*  Clinical Data: Tracheostomy.  Shortness of breath.  PORTABLE CHEST - 1 VIEW  Comparison: 06/03/2012  Findings: Tracheostomy tube tip measures 8.3 cm above the carina. Shallow inspiration.  Borderline heart size.  There appears to be a small left pleural effusion.  There is increasing infiltration in the left lung.  This may represent pneumonia.  No pneumothorax.  IMPRESSION: Stable appearance of tracheostomy tube.  Small left pleural effusion with increasing infiltration in the left lung.   Original Report Authenticated By: Burman Nieves, M.D.    Dg Chest Port 1 View  06/03/2012  *RADIOLOGY REPORT*  Clinical Data: Post bronchoscopy.  Tracheostomy change.  PORTABLE CHEST - 1 VIEW  Comparison: 06/01/2012  Findings: Tracheostomy tube projects over the mid trachea.  No pneumothorax.  Heart is borderline in size.  Bilateral perihilar and left lower lobe airspace opacities  are noted.  Vascular congestion.  No visible effusions.  No acute bony abnormality.  Sclerotic area in the right humeral neck likely reflects old infarct or enchondroma.  IMPRESSION: Tracheostomy tube projects over the mid trachea.  No pneumothorax.  Bilateral perihilar and left lower lobe airspace opacities. Asymmetric edema versus infection.  Borderline heart size.   Original Report Authenticated By: Charlett Nose, M.D.    Dg Chest Port 1 View  05/26/2012  *RADIOLOGY REPORT*  Clinical Data: Respiratory failure.  PORTABLE CHEST - 1 VIEW  Comparison: 05/22/2012.  Findings: The tracheostomy tube is stable.  The right lung remains relatively clear.  Left lung demonstrates persistent airspace opacity and small effusion.  IMPRESSION: Persistent left lower lobe infiltrate and small effusion.   Original Report Authenticated By: Rudie Meyer, M.D.    Dg Chest Port 1 View  05/22/2012  *RADIOLOGY REPORT*  Clinical Data: Respiratory failure.  PORTABLE CHEST - 1 VIEW  Comparison: Chest 05/14/2012 and 05/21/2012.  Findings: Support tubes and lines are unchanged.  There has been interval worsening of left  basilar airspace disease.  Right lung appears clear.  Heart size is upper normal.  No pneumothorax or pleural fluid.  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  IMPRESSION: Worsening left basilar airspace disease.   Original Report Authenticated By: Holley Dexter, M.D.    Dg Chest Port 1 View  05/21/2012  *RADIOLOGY REPORT*  Clinical Data: Hypoxic respiratory failure.  PORTABLE CHEST - 1 VIEW  Comparison: 05/14/2012 and CT chest 05/02/2012.  Findings: Tracheostomy is midline.  Right PICC tip projects over the SVC.  Heart size stable.  Lungs are low in volume with mild bibasilar air space disease, left greater than right.  There may be a small left pleural effusion.  IMPRESSION: Low lung volumes with mild bibasilar air space disease, left greater than right, and probable small  left pleural effusion.   Original Report Authenticated By: Leanna Battles, M.D.    Dg Chest Port 1 View  05/14/2012  *RADIOLOGY REPORT*  Clinical Data: Respiratory failure.  Fever.  PORTABLE CHEST - 1 VIEW  Comparison: 05/05/2012  Findings: Tracheostomy tube, PICC, and NG tube appear in good position.  The heart size and vascularity are normal.  The interstitial infiltrates have almost resolved bilaterally.  No appreciable residual effusions.  IMPRESSION: Further clearing of the pulmonary infiltrates.   Original Report Authenticated By: Francene Boyers, M.D.    Dg Abd Portable 1v  05/20/2012  *RADIOLOGY REPORT*  Clinical Data: Abdominal pain.  Hypotension.  PORTABLE ABDOMEN - 1 VIEW  Comparison: Abdominal radiograph performed 05/15/2012  Findings: The visualized bowel gas pattern is unremarkable. Residual contrast is noted within the colon; no abnormal dilatation of small bowel loops is seen to suggest small bowel obstruction. No free intra-abdominal air is identified, though evaluation for free air is limited on a single supine view.  A G-tube is noted overlying the upper abdomen.  The visualized osseous structures are within normal limits; the sacroiliac joints are unremarkable in appearance.  An IVC filter is noted overlying expected position.  IMPRESSION: Unremarkable bowel gas pattern; no free intra-abdominal air seen.   Original Report Authenticated By: Tonia Ghent, M.D.    Dg Abd Portable 1v  05/15/2012  *RADIOLOGY REPORT*  Clinical Data: 62 year old male with planned percutaneous gastrostomy placement.  Barium evaluation.  PORTABLE ABDOMEN - 1 VIEW  Comparison: 05/12/2012.  Findings: Supine portable views at 1042 hours.  Barium contrast remains in the small bowel and has reached the ascending colon, but is not yet present in the transverse colon.  NG tube in place.  IVC filter re-identified. Nonobstructed bowel gas pattern.  Mildly improved ventilation at the left lung base.  IMPRESSION: Barium has  reached the ascending colon, not yet the transverse colon.  NG tube in place.   Original Report Authenticated By: Erskine Speed, M.D.    Dg Swallowing Func-speech Pathology  06/10/2012  Breck Coons [place], CCC-SLP     06/10/2012  1:13 PM Objective Swallowing Evaluation: Modified Barium Swallowing Study   Patient Details  Name: Cody [name] MRN: [id number] Date of Birth: 1950-07-15  Today's Date: 06/08/2012 Time: 4098-1191 SLP Time Calculation (min): 32 min  Past Medical History:  Past Medical History  Diagnosis Date  . Stroke   . Hypertension   . Hyperlipidemia   . GERD (gastroesophageal reflux disease)   . COPD (chronic obstructive pulmonary disease)    Past Surgical History:  Past Surgical History  Procedure Laterality Date  . Gastrostomy N/A 05/19/2012    Procedure: PEG  Possible Open Gastrostomy;  Surgeon: Kandis Cocking, MD;  Location: [facility] OR;  Service: General;  Laterality:  N/A;  . Video bronchoscopy Bilateral 06/03/2012    Procedure: VIDEO BRONCHOSCOPY WITHOUT FLUORO;  Surgeon: [name], MD;  Location: [facility] ENDOSCOPY;  Service:  Cardiopulmonary;  Laterality: Bilateral;   HPI:  Mr. Durr is a 62 year old white male with PMH of COPD, GERD,  HLD, HTN, 3 pack per day smoking history 20+ years, and HTN  presenting to the ED 1/5 with complaints of worsening shortness  of breath. History obtained by fiance. Diagnosed with potential  influenza with aspiration PNA (? during intubation). Intubated  due to respiratory distress 1/5. Extubated 1/15. Required trach  1/22. Hospital course complicated by new PE, Bilateral DVT s/p  IVC filter 1/27. 2/24 noted to have worsened LLL atx on cxr. 2/25  mild temp (100.4) and worsened cxr. PCCM consulted for concern of  LLL PNA. Patient had FEES 1/31 recommended NPO and MBS 2/14  recommended NPO with po trials. Was d/c to CIR and then  transferred to acute care due to respiratory distress 2/26.  Patient was downsized to size 6 cuffless trach 3/3.     Assessment / Plan /  Recommendation Clinical Impression  Dysphagia Diagnosis: Mild oral phase dysphagia;Moderate  pharyngeal phase dysphagia Clinical impression: Swallow function has improved from study on  2/14. Patient presents with a moderate pharyngeal phase dysphagia  with a mild oral phase component. Size 6 cuffless trach was in  place during MBS with PMSV on. Oral phase was characterized by  lingual residue due to base of tongue weakness. Patient aspirated  on trials of thin liquids to below the level of the vocal cords  with no attempt to clear penetrates spontaneously and maintained  a mod-severe amount of residue in both the valelculae and the  pyriform sinuses due to decreased base of tongue retraction and  decreased laryngeal elevation. A chin tuck strategy was utilized  which proved effective in decreasing amount of residue in  vallecular and pyriform sinus space. SLP recommends patient  upgrade to dys-1 (puree) diet with NO liquids, meds crushed in  puree with full supervision and use of chin tuck. PMSV donned  during po's and mulitple swallows during meals.    Treatment Recommendation  Therapy as outlined in treatment plan below    Diet Recommendation Dysphagia 1 (Puree);No liquids;Pudding-thick  liquid   Medication Administration: Crushed with puree Supervision: Patient able to self feed;Full supervision/cueing  for compensatory strategies Compensations: Slow rate;Small sips/bites;Multiple dry swallows  after each bite/sip Postural Changes and/or Swallow Maneuvers: Seated upright 90  degrees;Upright 30-60 min after meal;Chin tuck    Other  Recommendations Oral Care Recommendations: Oral care BID Other Recommendations: Order thickener from pharmacy;Place PMSV  during PO intake;Have oral suction available   Follow Up Recommendations  Inpatient Rehab    Frequency and Duration min 2x/week  2 weeks   Pertinent Vitals/Pain None reported    SLP Swallow Goals Patient will utilize recommended strategies during swallow to   increase swallowing safety with: Supervision/safety;Moderate  assistance Swallow Study Goal #2 - Progress: Progressing toward goal Pt. will perform exercises for improved tongue base and laryngeal  ROM and strength with max visual/verbal/tactile cues.  General Date of Onset: 04/12/12 HPI: Mr. Salguero is a 62 year old white male with PMH of COPD,  GERD, HLD, HTN, 3 pack per day smoking history 20+ years, and HTN  presenting to the ED 1/5 with  complaints of worsening shortness  of breath. History obtained by fiance. Diagnosed with potential  influenza with aspiration PNA (? during intubation). Intubated  due to respiratory distress 1/5. Extubated 1/15. Required trach  1/22. Hospital course complicated by new PE, Bilateral DVT s/p  IVC filter 1/27. 2/24 noted to have worsened LLL atx on cxr. 2/25  mild temp (100.4) and worsened cxr. PCCM consulted for concern of  LLL PNA. Patient had FEES 1/31 recommended NPO and MBS 2/14  recommended NPO with po trials. Was d/c to CIR and then  transferred to acute care due to respiratory distress 2/26.  Patient was downsized to size 6 cuffless trach 3/3. Type of Study: Modified Barium Swallowing Study Reason for Referral: Objectively evaluate swallowing function Previous Swallow Assessment: 05/22/12 MBS completed. Diet Prior to this Study: NPO;PEG tube (Patient accidentally  pulled PEG out) Temperature Spikes Noted: No Respiratory Status: Trach Trach Size and Type: Uncuffed;#6;With PMSV in place History of Recent Intubation: No Behavior/Cognition: Cooperative;Alert;Pleasant mood Oral Cavity - Dentition: Poor condition Oral Motor / Sensory Function: Impaired - see Bedside swallow  eval Self-Feeding Abilities: Able to feed self;Needs assist Patient Positioning: Upright in chair Baseline Vocal Quality: Low vocal intensity;Hoarse Volitional Cough: Weak Volitional Swallow: Able to elicit    Reason for Referral Objectively evaluate swallowing function   Oral Phase Oral Preparation/Oral  Phase Oral Phase: Impaired Oral - Nectar Oral - Nectar Teaspoon: Lingual/palatal residue;Reduced posterior  propulsion Oral - Nectar Cup: Lingual/palatal residue;Reduced posterior  propulsion Oral - Thin Oral - Thin Teaspoon: Lingual/palatal residue;Reduced posterior  propulsion Oral - Solids Oral - Puree: Lingual/palatal residue;Reduced posterior  propulsion Oral - Mechanical Soft: Lingual/palatal residue;Right pocketing  in lateral sulci;Weak lingual manipulation   Pharyngeal Phase Pharyngeal Phase Pharyngeal Phase: Impaired Pharyngeal - Honey Pharyngeal - Honey Cup: Not tested Pharyngeal - Nectar Pharyngeal - Nectar Teaspoon: Pharyngeal residue -  valleculae;Pharyngeal residue - pyriform sinuses;Delayed swallow  initiation;Premature spillage to valleculae;Reduced tongue base  retraction (Minimal delay in swallow initiation) Pharyngeal - Nectar Cup: Delayed swallow initiation;Premature  spillage to valleculae;Pharyngeal residue - valleculae;Pharyngeal  residue - pyriform sinuses;Reduced laryngeal elevation Pharyngeal - Thin Pharyngeal - Thin Teaspoon: Reduced tongue base  retraction;Reduced laryngeal elevation;Pharyngeal residue -  valleculae;Pharyngeal residue - pyriform  sinuses;Penetration/Aspiration before  swallow;Penetration/Aspiration during swallow;Delayed swallow  initiation;Premature spillage to valleculae Penetration/Aspiration details (thin teaspoon): Material enters  airway, CONTACTS cords and not ejected out Pharyngeal - Thin Cup: Not tested Penetration/Aspiration details (thin cup): Material does not  enter airway Pharyngeal - Solids Pharyngeal - Puree: Premature spillage to valleculae;Delayed  swallow initiation;Reduced tongue base retraction;Pharyngeal  residue - valleculae;Pharyngeal residue - pyriform sinuses Penetration/Aspiration details (puree): Material does not enter  airway Pharyngeal - Mechanical Soft: Not tested  Cervical Esophageal Phase    GO   Berdine Dance SLP student           Berdine Dance 06/08/2012, 3:37 PM                            Berdine Dance SLP student Berdine Dance 06/08/2012, 3:37 PM     Dg Swallowing Func-speech Pathology  05/22/2012  Breck Coons [place], CCC-SLP     05/22/2012  3:47 PM Objective Swallowing Evaluation: Modified Barium Swallowing Study   Patient Details  Name: Cody [name] MRN: [id number] Date of Birth: 09-17-1950  Today's Date: 05/22/2012 Time: 4098-1191 SLP Time Calculation (min): 20 min  Past Medical History:  Past Medical History  Diagnosis Date  .  Stroke   . Hypertension   . Hyperlipidemia   . GERD (gastroesophageal reflux disease)   . COPD (chronic obstructive pulmonary disease)    Past Surgical History:  Past Surgical History  Procedure Laterality Date  . Gastrostomy N/A 05/19/2012    Procedure: PEG Possible Open Gastrostomy;  Surgeon: Kandis Cocking, MD;  Location: [facility] OR;  Service: General;  Laterality:  N/A;   HPI:  Mr. Groeneveld is a 62 year old white male with PMH of COPD, 3 pack  per day smoking history 20+ years, and HTN presenting to the ED  1/5 with complaints of worsening shortness of breath x4 days.  History obtained by fiance. Mr. Baby has baseline shortness of  breath especially on exertion, however, for the past 4 days he  has been having increased difficulty and has also developed a  worsening productive cough with white sputum and occasionally  streaked with bright red blood. Fiance reports increasing  confusion and extreme light headedmess when standing. She denied  him to have any headaches, fevers, LOC, N/V/D, chest pain,  abdominal pain, or any urinary complaints. Diagnosed with  potential influenza with aspiration PNA (? during intubation).  Intubated due to respiratory distress 1/5. Extubated 1/15.   Initial BSE performed 04/23/12, placed on po diet and discharged  from ST.  Pt. was subsequently reintubated due to respiratory  distress, self extubated 1/21 and reintubated same day.  Trach  placed 1/22.  FEES performed 1/31 with  recommendations for NPO  and pt. received PEG last week.  Pt.'s trach was downsized to #6  cuffless this week and he continues to tolerate his speaking  valve with increased vocal intensity.  Repeat MBS recommneded.     Assessment / Plan / Recommendation Clinical Impression  Dysphagia Diagnosis: Moderate pharyngeal phase dysphagia;Severe  pharyngeal phase dysphagia Clinical impression: MBS completed with PMSV in place and all  vital signs stable.  Swallow function has mildly improved from  previous objective assessment (FEES 1/31), however he continues  to exhibit moderate-severe motor based pharyngeal dysphagia and  is unsafe to initiate po's.  Decreased laryngeal elevation,  epiglottic deflection lead to silent larygneal penetration to  vocal cords and silent aspiration with thin barium.  Honey thick  barium is not recommended due to increase in amount of vallecular  and pyriform sinus residue and potential for spilling into  trachea.  Pt.'s cognition is somewhat improved, however he  continues to exhibit decreased attention and awareness and would  not have been able to carry out strategies to decrease aspiration  risk.  Conintue NPO with nutrition via PEG.  Potential for  improvement is very good and ST will continue to work with pt. on  PMSV and dysphagia.         Treatment Recommendation  Therapy as outlined in treatment plan below    Diet Recommendation NPO        Other  Recommendations Oral Care Recommendations: Oral care BID   Follow Up Recommendations  Inpatient Rehab    Frequency and Duration min 2x/week  2 weeks   Pertinent Vitals/Pain none    SLP Swallow Goals Goal #4: Pt. will consume trials of puree only with SLP for  increased strength during treatment with max verbal/visual cues.   General HPI: Mr. Froman is a 62 year old white male with PMH of  COPD, 3 pack per day smoking history 20+ years, and HTN  presenting to the ED 1/5 with complaints of worsening shortness  of breath x4 days. History  obtained by fiance. Mr. Harmsen has  baseline shortness of breath especially on exertion, however, for  the past 4 days he has been having increased difficulty and has  also developed a worsening productive cough with white sputum and  occasionally streaked with bright red blood. Fiance reports  increasing confusion and extreme light headedmess when standing.  She denied him to have any headaches, fevers, LOC, N/V/D, chest  pain, abdominal pain, or any urinary complaints. Diagnosed with  potential influenza with aspiration PNA (? during intubation).  Intubated due to respiratory distress 1/5. Extubated 1/15.   Initial BSE performed 04/23/12, placed on po diet and discharged  from ST.  Pt. was subsequently reintubated due to respiratory  distress, self extubated 1/21 and reintubated same day.  Trach  placed 1/22.  FEES performed 1/31 with recommendations for NPO  and pt. received PEG last week.  Pt.'s trach was downsized to #6  cuffless this week and he continues to tolerate his speaking  valve with increased vocal intensity.  Repeat MBS recommneded. Type of Study: Modified Barium Swallowing Study Reason for Referral: Objectively evaluate swallowing function Diet Prior to this Study: NPO;PEG tube Temperature Spikes Noted: No Respiratory Status: Trach Trach Size and Type: #6;Uncuffed History of Recent Intubation: Yes Length of Intubations (days): 10 days Date extubated: 04/22/12 Behavior/Cognition: Alert;Cooperative;Requires cueing;Decreased  sustained attention Oral Cavity - Dentition: Missing dentition Oral Motor / Sensory Function: Impaired - see Bedside swallow  eval Self-Feeding Abilities: Able to feed self;Needs assist Patient Positioning: Upright in chair Anatomy: Within functional limits Pharyngeal Secretions: Not observed secondary MBS    Reason for Referral Objectively evaluate swallowing function   Oral Phase Oral Preparation/Oral Phase Oral Phase: Impaired Oral - Nectar Oral - Nectar Cup: Delayed oral  transit Oral - Solids Oral - Puree: Delayed oral transit (minimal)   Pharyngeal Phase Pharyngeal Phase Pharyngeal Phase: Impaired Pharyngeal - Honey Pharyngeal - Honey Cup: Pharyngeal residue -  valleculae;Pharyngeal residue - pyriform sinuses;Reduced  laryngeal elevation;Reduced tongue base retraction Pharyngeal - Nectar Pharyngeal - Nectar Cup: Pharyngeal residue -  valleculae;Pharyngeal residue - pyriform sinuses;Reduced  laryngeal elevation;Reduced anterior laryngeal mobility;Reduced  airway/laryngeal closure;Moderate  aspiration;Penetration/Aspiration during swallow Penetration/Aspiration details (nectar cup): Material enters  airway, CONTACTS cords and not ejected out Pharyngeal - Thin Pharyngeal - Thin Cup: Penetration/Aspiration during  swallow;Pharyngeal residue - valleculae;Pharyngeal residue -  pyriform sinuses;Moderate aspiration;Reduced tongue base  retraction;Reduced laryngeal elevation Penetration/Aspiration details (thin cup): Material enters  airway, passes BELOW cords without attempt by patient to eject  out (silent aspiration) Pharyngeal - Solids Pharyngeal - Puree: Reduced epiglottic inversion;Reduced  pharyngeal peristalsis;Reduced anterior laryngeal  mobility;Pharyngeal residue - valleculae;Pharyngeal residue -  pyriform sinuses;Reduced laryngeal elevation;Reduced tongue base  retraction  Cervical Esophageal Phase    GO    Cervical Esophageal Phase Cervical Esophageal Phase: [facility]         Darrow [email or web address].Ed CCC-SLP Pager [phone]  05/22/2012   Results for orders placed during the hospital encounter of 06/03/12  MRSA PCR SCREENING      Result Value Range   MRSA by PCR NEGATIVE  NEGATIVE  CULTURE, RESPIRATORY (NON-EXPECTORATED)      Result Value Range   Specimen Description TRACHEAL ASPIRATE     Special Requests NONE     Gram Stain       Value: ABUNDANT WBC PRESENT,BOTH PMN AND MONONUCLEAR     RARE SQUAMOUS EPITHELIAL CELLS PRESENT     NO ORGANISMS SEEN   Culture Non-Pathogenic  Oropharyngeal-type Flora Isolated.     Report Status 06/06/2012 FINAL    CULTURE, BLOOD (ROUTINE X 2)      Result Value Range   Specimen Description BLOOD HAND LEFT     Special Requests BOTTLES DRAWN AEROBIC ONLY 10CC     Culture  Setup Time 06/04/2012 22:23     Culture NO GROWTH 5 DAYS     Report Status 06/10/2012 FINAL    CULTURE, BLOOD (ROUTINE X 2)      Result Value Range   Specimen Description BLOOD HAND RIGHT     Special Requests BOTTLES DRAWN AEROBIC ONLY 3CC     Culture  Setup Time 06/04/2012 22:27     Culture NO GROWTH 5 DAYS     Report Status 06/10/2012 FINAL    CLOSTRIDIUM DIFFICILE BY PCR      Result Value Range   C difficile by pcr NEGATIVE  NEGATIVE  GLUCOSE, CAPILLARY      Result Value Range   Glucose-Capillary 99  70 - 99 mg/dL  CBC      Result Value Range   WBC 12.5 (*) 4.0 - 10.5 K/uL   RBC 3.48 (*) 4.22 - 5.81 MIL/uL   Hemoglobin 10.1 (*) 13.0 - 17.0 g/dL   HCT 16.1 (*) 09.6 - 04.5 %   MCV 90.2  78.0 - 100.0 fL   MCH 29.0  26.0 - 34.0 pg   MCHC 32.2  30.0 - 36.0 g/dL   RDW 40.9 (*) 81.1 - 91.4 %   Platelets 257  150 - 400 K/uL  BASIC METABOLIC PANEL      Result Value Range   Sodium 137  135 - 145 mEq/L   Potassium 3.6  3.5 - 5.1 mEq/L   Chloride 95 (*) 96 - 112 mEq/L   CO2 35 (*) 19 - 32 mEq/L   Glucose, Bld 119 (*) 70 - 99 mg/dL   BUN 14  6 - 23 mg/dL   Creatinine, Ser 7.82  0.50 - 1.35 mg/dL   Calcium 8.9  8.4 - 95.6 mg/dL   GFR calc non Af Amer >90  >90 mL/min   GFR calc Af Amer >90  >90 mL/min  PHOSPHORUS      Result Value Range   Phosphorus 3.6  2.3 - 4.6 mg/dL  MAGNESIUM      Result Value Range   Magnesium 1.7  1.5 - 2.5 mg/dL  GLUCOSE, CAPILLARY      Result Value Range   Glucose-Capillary 93  70 - 99 mg/dL  GLUCOSE, CAPILLARY      Result Value Range   Glucose-Capillary 97  70 - 99 mg/dL   Comment 1 Documented in Chart     Comment 2 Notify RN    PROTIME-INR      Result Value Range   Prothrombin Time 23.6 (*) 11.6 - 15.2 seconds    INR 2.21 (*) 0.00 - 1.49  GLUCOSE, CAPILLARY      Result Value Range   Glucose-Capillary 93  70 - 99 mg/dL   Comment 1 Documented in Chart     Comment 2 Notify RN    GLUCOSE, CAPILLARY      Result Value Range   Glucose-Capillary 93  70 - 99 mg/dL   Comment 1 Documented in Chart     Comment 2 Notify RN    GLUCOSE, CAPILLARY      Result Value Range   Glucose-Capillary 87  70 - 99 mg/dL   Comment 1 Documented in Chart  Comment 2 Notify RN    PROTIME-INR      Result Value Range   Prothrombin Time 28.9 (*) 11.6 - 15.2 seconds   INR 2.91 (*) 0.00 - 1.49  GLUCOSE, CAPILLARY      Result Value Range   Glucose-Capillary 115 (*) 70 - 99 mg/dL  VANCOMYCIN, TROUGH      Result Value Range   Vancomycin Tr 23.7 (*) 10.0 - 20.0 ug/mL  MAGNESIUM      Result Value Range   Magnesium 1.7  1.5 - 2.5 mg/dL  PROCALCITONIN      Result Value Range   Procalcitonin 0.15    PROTIME-INR      Result Value Range   Prothrombin Time 25.6 (*) 11.6 - 15.2 seconds   INR 2.47 (*) 0.00 - 1.49  RENAL FUNCTION PANEL      Result Value Range   Sodium 143  135 - 145 mEq/L   Potassium 2.9 (*) 3.5 - 5.1 mEq/L   Chloride 101  96 - 112 mEq/L   CO2 34 (*) 19 - 32 mEq/L   Glucose, Bld 136 (*) 70 - 99 mg/dL   BUN 14  6 - 23 mg/dL   Creatinine, Ser 1.61  0.50 - 1.35 mg/dL   Calcium 9.1  8.4 - 09.6 mg/dL   Phosphorus 2.8  2.3 - 4.6 mg/dL   Albumin 2.0 (*) 3.5 - 5.2 g/dL   GFR calc non Af Amer >90  >90 mL/min   GFR calc Af Amer >90  >90 mL/min  CBC      Result Value Range   WBC 14.0 (*) 4.0 - 10.5 K/uL   RBC 3.82 (*) 4.22 - 5.81 MIL/uL   Hemoglobin 11.2 (*) 13.0 - 17.0 g/dL   HCT 04.5 (*) 40.9 - 81.1 %   MCV 91.9  78.0 - 100.0 fL   MCH 29.3  26.0 - 34.0 pg   MCHC 31.9  30.0 - 36.0 g/dL   RDW 91.4  78.2 - 95.6 %   Platelets 312  150 - 400 K/uL  PROCALCITONIN      Result Value Range   Procalcitonin 0.13    RENAL FUNCTION PANEL      Result Value Range   Sodium 144  135 - 145 mEq/L   Potassium 2.9 (*)  3.5 - 5.1 mEq/L   Chloride 102  96 - 112 mEq/L   CO2 33 (*) 19 - 32 mEq/L   Glucose, Bld 136 (*) 70 - 99 mg/dL   BUN 17  6 - 23 mg/dL   Creatinine, Ser 2.13  0.50 - 1.35 mg/dL   Calcium 9.1  8.4 - 08.6 mg/dL   Phosphorus 3.0  2.3 - 4.6 mg/dL   Albumin 1.9 (*) 3.5 - 5.2 g/dL   GFR calc non Af Amer >90  >90 mL/min   GFR calc Af Amer >90  >90 mL/min  CBC      Result Value Range   WBC 13.2 (*) 4.0 - 10.5 K/uL   RBC 3.65 (*) 4.22 - 5.81 MIL/uL   Hemoglobin 10.6 (*) 13.0 - 17.0 g/dL   HCT 57.8 (*) 46.9 - 62.9 %   MCV 92.6  78.0 - 100.0 fL   MCH 29.0  26.0 - 34.0 pg   MCHC 31.4  30.0 - 36.0 g/dL   RDW 52.8  41.3 - 24.4 %   Platelets 331  150 - 400 K/uL  MAGNESIUM      Result Value Range   Magnesium  1.9  1.5 - 2.5 mg/dL  PROCALCITONIN      Result Value Range   Procalcitonin 0.16    PROTIME-INR      Result Value Range   Prothrombin Time 28.0 (*) 11.6 - 15.2 seconds   INR 2.79 (*) 0.00 - 1.49  PROTIME-INR      Result Value Range   Prothrombin Time 29.3 (*) 11.6 - 15.2 seconds   INR 2.96 (*) 0.00 - 1.49  CBC      Result Value Range   WBC 12.5 (*) 4.0 - 10.5 K/uL   RBC 3.87 (*) 4.22 - 5.81 MIL/uL   Hemoglobin 11.1 (*) 13.0 - 17.0 g/dL   HCT 40.9 (*) 81.1 - 91.4 %   MCV 95.3  78.0 - 100.0 fL   MCH 28.7  26.0 - 34.0 pg   MCHC 30.1  30.0 - 36.0 g/dL   RDW 78.2 (*) 95.6 - 21.3 %   Platelets 366  150 - 400 K/uL  RENAL FUNCTION PANEL      Result Value Range   Sodium 154 (*) 135 - 145 mEq/L   Potassium 4.3  3.5 - 5.1 mEq/L   Chloride 110  96 - 112 mEq/L   CO2 37 (*) 19 - 32 mEq/L   Glucose, Bld 111 (*) 70 - 99 mg/dL   BUN 14  6 - 23 mg/dL   Creatinine, Ser 0.86  0.50 - 1.35 mg/dL   Calcium 9.9  8.4 - 57.8 mg/dL   Phosphorus 3.6  2.3 - 4.6 mg/dL   Albumin 2.2 (*) 3.5 - 5.2 g/dL   GFR calc non Af Amer >90  >90 mL/min   GFR calc Af Amer >90  >90 mL/min  PROTIME-INR      Result Value Range   Prothrombin Time 39.1 (*) 11.6 - 15.2 seconds   INR 4.38 (*) 0.00 - 1.49  BASIC  METABOLIC PANEL      Result Value Range   Sodium 144  135 - 145 mEq/L   Potassium 3.0 (*) 3.5 - 5.1 mEq/L   Chloride 102  96 - 112 mEq/L   CO2 36 (*) 19 - 32 mEq/L   Glucose, Bld 118 (*) 70 - 99 mg/dL   BUN 13  6 - 23 mg/dL   Creatinine, Ser 4.69  0.50 - 1.35 mg/dL   Calcium 8.8  8.4 - 62.9 mg/dL   GFR calc non Af Amer >90  >90 mL/min   GFR calc Af Amer >90  >90 mL/min  PROTIME-INR      Result Value Range   Prothrombin Time 35.5 (*) 11.6 - 15.2 seconds   INR 3.84 (*) 0.00 - 1.49  PROTIME-INR      Result Value Range   Prothrombin Time 22.8 (*) 11.6 - 15.2 seconds   INR 2.11 (*) 0.00 - 1.49  BASIC METABOLIC PANEL      Result Value Range   Sodium 140  135 - 145 mEq/L   Potassium 3.1 (*) 3.5 - 5.1 mEq/L   Chloride 100  96 - 112 mEq/L   CO2 34 (*) 19 - 32 mEq/L   Glucose, Bld 106 (*) 70 - 99 mg/dL   BUN 10  6 - 23 mg/dL   Creatinine, Ser 5.28  0.50 - 1.35 mg/dL   Calcium 8.9  8.4 - 41.3 mg/dL   GFR calc non Af Amer >90  >90 mL/min   GFR calc Af Amer >90  >90 mL/min  CBC  Result Value Range   WBC 12.2 (*) 4.0 - 10.5 K/uL   RBC 3.94 (*) 4.22 - 5.81 MIL/uL   Hemoglobin 11.4 (*) 13.0 - 17.0 g/dL   HCT 16.1 (*) 09.6 - 04.5 %   MCV 90.1  78.0 - 100.0 fL   MCH 28.9  26.0 - 34.0 pg   MCHC 32.1  30.0 - 36.0 g/dL   RDW 40.9 (*) 81.1 - 91.4 %   Platelets 423 (*) 150 - 400 K/uL  PROTIME-INR      Result Value Range   Prothrombin Time 20.5 (*) 11.6 - 15.2 seconds   INR 1.83 (*) 0.00 - 1.49  PROTIME-INR      Result Value Range   Prothrombin Time 18.4 (*) 11.6 - 15.2 seconds   INR 1.58 (*) 0.00 - 1.49  BASIC METABOLIC PANEL      Result Value Range   Sodium 136  135 - 145 mEq/L   Potassium 3.4 (*) 3.5 - 5.1 mEq/L   Chloride 98  96 - 112 mEq/L   CO2 32  19 - 32 mEq/L   Glucose, Bld 110 (*) 70 - 99 mg/dL   BUN 8  6 - 23 mg/dL   Creatinine, Ser 7.82  0.50 - 1.35 mg/dL   Calcium 8.5  8.4 - 95.6 mg/dL   GFR calc non Af Amer >90  >90 mL/min   GFR calc Af Amer >90  >90 mL/min   HEPARIN LEVEL (UNFRACTIONATED)      Result Value Range   Heparin Unfractionated <0.10 (*) 0.30 - 0.70 IU/mL  CBC      Result Value Range   WBC 11.1 (*) 4.0 - 10.5 K/uL   RBC 3.47 (*) 4.22 - 5.81 MIL/uL   Hemoglobin 10.1 (*) 13.0 - 17.0 g/dL   HCT 21.3 (*) 08.6 - 57.8 %   MCV 89.6  78.0 - 100.0 fL   MCH 29.1  26.0 - 34.0 pg   MCHC 32.5  30.0 - 36.0 g/dL   RDW 46.9 (*) 62.9 - 52.8 %   Platelets 351  150 - 400 K/uL  POCT I-STAT 3, BLOOD GAS (G3+)      Result Value Range   pH, Arterial 7.485 (*) 7.350 - 7.450   pCO2 arterial 49.3 (*) 35.0 - 45.0 mmHg   pO2, Arterial 75.0 (*) 80.0 - 100.0 mmHg   Bicarbonate 37.1 (*) 20.0 - 24.0 mEq/L   TCO2 39  0 - 100 mmol/L   O2 Saturation 96.0     Acid-Base Excess 12.0 (*) 0.0 - 2.0 mmol/L   Patient temperature 98.6 F     Collection site RADIAL, [facility] TEST ACCEPTABLE     Drawn by RT     Sample type ARTERIAL     A/P: Pt with hx of COPD, VDRF/aspiration PNA with trach and worsening findings on recent CT chest suggesting empyema. Plan is for CT guided left chest empyema drainage today. Details/risks of procedure d/w pt with his understanding and consent. IV heparin will be stopped 2 hrs preprocedure to minimize bleeding risks.

## 2012-06-11 NOTE — Progress Notes (Signed)
Heparin drip rate changed to 16 ml/hr according to [facility] PharmD.

## 2012-06-12 ENCOUNTER — Encounter (HOSPITAL_COMMUNITY)
Admission: AD | Disposition: A | Discharge status: Skilled Nursing Facility | Payer: Self-pay | Source: Other Acute Inpatient Hospital | Attending: Internal Medicine

## 2012-06-12 ENCOUNTER — Inpatient Hospital Stay (HOSPITAL_COMMUNITY): Payer: Medicaid Other

## 2012-06-12 LAB — HEPARIN LEVEL (UNFRACTIONATED)
Heparin Unfractionated: 0.25 IU/mL — ABNORMAL LOW (ref 0.30–0.70)
Heparin Unfractionated: 0.27 IU/mL — ABNORMAL LOW (ref 0.30–0.70)

## 2012-06-12 LAB — PROTIME-INR
INR: 1.22 (ref 0.00–1.49)
Prothrombin Time: 15.2 seconds (ref 11.6–15.2)

## 2012-06-12 LAB — CBC
Hemoglobin: 11.5 g/dL — ABNORMAL LOW (ref 13.0–17.0)
MCH: 29.4 pg (ref 26.0–34.0)
MCHC: 33 g/dL (ref 30.0–36.0)
RDW: 15.3 % (ref 11.5–15.5)

## 2012-06-12 LAB — GRAM STAIN

## 2012-06-12 SURGERY — VIDEO ASSISTED THORACOSCOPY (VATS)/DECORTICATION
Anesthesia: General | Site: Chest | Laterality: Left

## 2012-06-12 MED ORDER — IPRATROPIUM BROMIDE 0.02 % IN SOLN: 0.5000 mg | RESPIRATORY_TRACT | Status: DC | PRN

## 2012-06-12 MED ORDER — TRAZODONE HCL 50 MG PO TABS
50.0000 mg | ORAL_TABLET | Freq: Every evening | ORAL | Status: DC | PRN
  Administered 2012-06-12 – 2012-06-16 (×4): 50 mg via ORAL
  Filled 2012-06-12 (×5): qty 1

## 2012-06-12 MED ORDER — ALBUTEROL SULFATE (5 MG/ML) 0.5% IN NEBU: 2.5000 mg | INHALATION_SOLUTION | RESPIRATORY_TRACT | Status: DC | PRN

## 2012-06-12 NOTE — Progress Notes (Signed)
ANTICOAGULATION CONSULT NOTE - Follow Up Consult  Pharmacy Consult for Heparin bridge while Warfarin on hold Indication: Hx recent PE/DVT (Jan '14)  Allergies  Allergen Reactions  . Protamine Anaphylaxis    Patient Measurements: Height: 5\' 8"  (172.7 cm) Weight: 184 lb 9.6 oz (83.734 kg) IBW/kg (Calculated) : 68.4 Heparin Dosing Weight: 82.6 kg  Vital Signs: Temp: 98.3 F (36.8 C) (03/06 2032) Temp src: Oral (03/06 2032) BP: 117/72 mmHg (03/06 2032) Pulse Rate: 92 (03/06 2349)  Labs:  Recent Labs  06/09/12 0640  06/10/12 0620 06/10/12 1755 06/11/12 0257 06/11/12 0905 06/12/12 0012  HGB  --   --  11.4*  --  10.1*  --   --   HCT  --   --  35.5*  --  31.1*  --   --   PLT  --   --  423*  --  351  --   --   LABPROT 39.1*  < > 22.8* 20.5* 18.4*  --   --   INR 4.38*  < > 2.11* 1.83* 1.58*  --   --   HEPARINUNFRC  --   --   --   --  <0.10* <0.10* 0.25*  CREATININE 0.74  --  0.73  --  0.69  --   --   < > = values in this interval not displayed.  Estimated Creatinine Clearance: 98.3 ml/min (by C-G formula based on Cr of 0.69).   Assessment: 62 y.o M on heparin bridge while warfarin on hold for hx recent DVT/PE in Jan '14. Patient is s/p CT guided left chest tube placement. Heparin level (0.25) is below-goal on 2100 units/hr. No problem with line / infusion per RN.   Goal of Therapy:  Heparin level 0.3-0.7 units/ml Monitor platelets by anticoagulation protocol: Yes   Plan:  1. Increase IV heparin to 2300 units/hr.  2. Heparin level in 6 hours.   Lorre Munroe, PharmD 06/12/2012 1:32 AM

## 2012-06-12 NOTE — Progress Notes (Signed)
Name: Cody [name]  MRN: [id number]  DOB: September 21, 1950  ADMISSION DATE: 05/29/2012   CONSULTATION DATE: 2/25 rehab, now admission 2/26  BRIEF PATIENT DESCRIPTION: 62 y/o M with PMH of COPD, GERD, HLD, HTN, CVA admitted 1/5-2/21 with VDRF secondary to influenza A and aspiration PNA. Required trach 1/22. Hospital course complicated by new PE, Bilateral DVT s/p IVC filter 1/27. 2/24 noted to have worsened LLL atx on cxr. 2/25 mild temp (100.4) and worsened cxr. PCCM consulted for concern of LLL PNA.   CULTURES:  2/25 Sputum >> MSSA 2/26 bronch BAL>>> negative Blood 2/27 >> neg 2/28 C diff >> NEG 3/06 Lt pleural fluid >>   ANTIBIOTICS:  Zosyn 2/25>  vanc 2/26 >>3/1  SIGNIFICANT EVENTS / STUDIES:  1/5-2/21 - Admit to [facility] for VDRF 2/2 flu, asp PNA s/p trach  2/26 retrach, bronch, distress, vent needed 2/27 off full support. ATC 2/28 back on to full support 2/28  Back to ATC 3/03 PEG pulled out, passed for D1, pudding thick liquid  3/04: ct chest >> Large cavitary left lower lung region consistent with empyema, increased ASD in lingula, mediastinal LAN 3/05: Consulted TCTS 3/06: IR placed catheter Lt pleural space >> LDH > 10K, Protein 3.3, WBC 26,800 (79%N)  SUBJECTIVE:   Breathing better.  Sore at chest tube site.  VITAL SIGNS: Temp:  [97.3 F (36.3 C)-98.6 F (37 C)] 98.2 F (36.8 C) (03/07 0533) Pulse Rate:  [54-104] 74 (03/07 0533) Resp:  [17-26] 18 (03/07 0533) BP: (117-187)/(72-102) 135/82 mmHg (03/07 0533) SpO2:  [91 %-98 %] 95 % (03/07 0533) FiO2 (%):  [35 %] 35 % (03/07 0347) Weight:  [184 lb 9.6 oz (83.734 kg)] 184 lb 9.6 oz (83.734 kg) (03/06 2032)  INTAKE / OUTPUT: Intake/Output     03/06 0701 - 03/07 0700 03/07 0701 - 03/08 0700   P.O.     I.V. (mL/kg) 3093.4 (36.9)    IV Piggyback 100    Total Intake(mL/kg) 3193.4 (38.1)    Net +3193.4          Urine Occurrence 1 x    Stool Occurrence 1 x     PHYSICAL EXAMINATION:  General:  Chronically ill in  NAD Neuro:  Alert, normal strength HEENT:  Trach clean, weak but improved phonation with PMV Cardiovascular:  s1 s2 RRR Lungs:  Decreased in left  35% ATC Abdomen:  Soft, BS wnl Musculoskeletal:  No edema Skin:  No rash  Recent Labs Lab 06/09/12 0640 06/10/12 0620 06/11/12 0257  NA 144 140 136  K 3.0* 3.1* 3.4*  CL 102 100 98  CO2 36* 34* 32  BUN 13 10 8   CREATININE 0.74 0.73 0.69  GLUCOSE 118* 106* 110*    Recent Labs Lab 06/08/12 0700 06/10/12 0620 06/11/12 0257  HGB 11.1* 11.4* 10.1*  HCT 36.9* 35.5* 31.1*  WBC 12.5* 12.2* 11.1*  PLT 366 423* 351   Lab Results  Component Value Date   INR 1.22 06/12/2012   INR 1.58* 06/11/2012   INR 1.83* 06/10/2012   Imaging: Ct Perc Pleural Drain W/indwell Cath W/img Guide  06/11/2012  *RADIOLOGY REPORT*  Clinical Data: Left-sided pleural empyema.  The patient requires drainage catheter placement.  CT GUIDED LEFT CHEST TUBE THORACOSTOMY FOR EMPYEMA  Sedation:  2.0 mg IV Versed; 100 mcg IV Fentanyl  Total Moderate Sedation Time: 25 minutes.  Procedure:  The procedure, risks, benefits, and alternatives were explained to the patient.  Questions regarding the procedure were encouraged and answered.  The patient understands and consents to the procedure.  The left posterior chest wall was prepped with Betadine in a sterile fashion, and a sterile drape was applied covering the operative field.  A sterile gown and sterile gloves were used for the procedure. Local anesthesia was provided with 1% Lidocaine.  The patient was placed in a prone and slightly rolled position to access the posterior left chest. An 18 gauge trocar needle was advanced under CT guidance into the posterior left pleural space. A guidewire was advanced.  Over a wire, tract dilatation was performed and a 14-French pigtail drainage catheter placed. Catheter position was confirmed by CT.  The catheter was aspirated and a sample of fluid obtained for laboratory studies.  The tube was  flushed and connected to a [place] Pleur-Evac device.  The catheter was secured at the skin with a retention suture and Stat- Lock device.  Complications: None.  Findings: Aspiration of fluid in the left posterior pleural empyema revealed grossly purulent and foul-smelling fluid.  No large sample was collected for multiple requested laboratory tests.  After 14- French drainage catheter placement, there is rapid drainage of fluid.  The [place] Pleur-Evac device will be connected to the low wall suction at a pressure of -20 cm of water.  IMPRESSION: CT guided left chest thoracostomy with placement of a 14-French pigtail drainage catheter into the posterior empyema.  Grossly purulent fluid return was noted with a large sample sent for multiple requested laboratory tests.   Original Report Authenticated By: Irish Lack, M.D.     ASSESSMENT / PLAN:  Aspiration pneumonia with empyema. Chronic respiratory failure s/p tracheostomy (#6 cuffless). PE with b/l lower extremity DVT acquired in hospital. Hx of COPD. P:   Continue trach collar with goal Spo2 > 92% F/u CXR Continue chest tube  F/u pleural fluid culture results Continue heparin gtt for DVT/PE until interventions for pleural disease completed >> then transition to coumadin Continue zosyn pending culture results Continue speech valve as tolerated  PRN BD's   Anemia of chronic disease. P:  F/u CBC  Dysphagia  P: D1 diet >> f/u with speech  Insomnia/anxiety. Improved with change from [place] to trazodone P: -continue trazodone at at bedtime -caution with sedating medications  Hx of HTN. P: Monitor blood pressure for now  Dispostion >> Will need SNF placement after intervention for empyema completed  Resolved problems >> acute respiratory failure, influenza A pneumonia, diarrhea  Coralyn Helling, MD [facility] Pulmonary/Critical Care 06/12/2012, 8:25 AM Pager:  [phone] After 3pm call: [phone]

## 2012-06-12 NOTE — Progress Notes (Signed)
ANTICOAGULATION CONSULT NOTE - Follow Up Consult  Pharmacy Consult for Heparin bridge while Warfarin on hold Indication: Hx recent PE/DVT (Jan '14)  Allergies  Allergen Reactions  . Protamine Anaphylaxis    Patient Measurements: Height: 5\' 8"  (172.7 cm) Weight: 184 lb 9.6 oz (83.734 kg) IBW/kg (Calculated) : 68.4 Heparin Dosing Weight: 82.6 kg  Vital Signs: Temp: 98 F (36.7 C) (03/07 1355) Temp src: Oral (03/07 1355) BP: 145/89 mmHg (03/07 1355) Pulse Rate: 79 (03/07 1501)  Labs:  Recent Labs  06/10/12 0620 06/10/12 1755 06/11/12 0257  06/12/12 0012 06/12/12 0650 06/12/12 0836 06/12/12 1525  HGB 11.4*  --  10.1*  --   --   --  11.5*  --   HCT 35.5*  --  31.1*  --   --   --  34.8*  --   PLT 423*  --  351  --   --   --  377  --   LABPROT 22.8* 20.5* 18.4*  --   --  15.2  --   --   INR 2.11* 1.83* 1.58*  --   --  1.22  --   --   HEPARINUNFRC  --   --  <0.10*  < > 0.25*  --  0.36 0.27*  CREATININE 0.73  --  0.69  --   --   --   --   --   < > = values in this interval not displayed.  Estimated Creatinine Clearance: 98.3 ml/min (by C-G formula based on Cr of 0.69).  Assessment: 62 y.o M on heparin bridge while warfarin on hold for hx recent DVT/PE in Jan '14. The patient went for CT guided left chest empyema drainage on 3/6 and the patient still has a chest tube in place. Heparin level is currently subtherapeutic on 2300 units/hr.  Warfarin remains on hold per CCM "until interventions for pleural disease completed". Hgb/Hct/Plt stable -- no overt s/sx of bleeding noted. Slight pink tinge noted of CT drainage (per nurse report) -- no other s/sx of bleeding noted at this time.   Goal of Therapy:  Heparin level 0.3-0.7 units/ml Monitor platelets by anticoagulation protocol: Yes   Plan:  1. Increase heparin drip to 2450 units/hr (24.5 ml/hr) 2. Will continue to follow restart plans for warfarin 3. Will continue to monitor for any signs/symptoms of bleeding and will  follow up with heparin level in 6 hours to confirm therapeutic.  [employer], Pharm.D., BCPS Clinical Pharmacist Pager [phone] 06/12/2012 5:12 PM

## 2012-06-12 NOTE — Progress Notes (Signed)
ANTICOAGULATION CONSULT NOTE - Follow Up Consult  Pharmacy Consult for Heparin bridge while Warfarin on hold Indication: Hx recent PE/DVT (Jan '14)  Allergies  Allergen Reactions  . Protamine Anaphylaxis    Patient Measurements: Height: 5\' 8"  (172.7 cm) Weight: 184 lb 9.6 oz (83.734 kg) IBW/kg (Calculated) : 68.4 Heparin Dosing Weight: 82.6 kg  Vital Signs: Temp: 98.1 F (36.7 C) (03/07 0938) Temp src: Oral (03/07 0533) BP: 126/72 mmHg (03/07 0938) Pulse Rate: 100 (03/07 0938)  Labs:  Recent Labs  06/10/12 0620 06/10/12 1755  06/11/12 0257 06/11/12 0905 06/12/12 0012 06/12/12 0650 06/12/12 0836  HGB 11.4*  --   --  10.1*  --   --   --  11.5*  HCT 35.5*  --   --  31.1*  --   --   --  34.8*  PLT 423*  --   --  351  --   --   --  377  LABPROT 22.8* 20.5*  --  18.4*  --   --  15.2  --   INR 2.11* 1.83*  --  1.58*  --   --  1.22  --   HEPARINUNFRC  --   --   < > <0.10* <0.10* 0.25*  --  0.36  CREATININE 0.73  --   --  0.69  --   --   --   --   < > = values in this interval not displayed.  Estimated Creatinine Clearance: 98.3 ml/min (by C-G formula based on Cr of 0.69).  Assessment: 62 y.o [name] on heparin bridge while warfarin on hold for hx recent DVT/PE in Jan '14. The patient went for CT guided left chest empyema drainage today on 3/6 and the patient still has a chest tube in place. Heparin level this morning is therapeutic (HL 0.36 << 0.25, goal of 0.3-0.7). Warfarin remains on hold per CCM "until interventions for pleural disease completed". Hgb/Hct/Plt stable -- no overt s/sx of bleeding noted. Slight pink tinge noted of CT drainage (per nurse report) -- no other s/sx of bleeding noted at this time.   Goal of Therapy:  Heparin level 0.3-0.7 units/ml Monitor platelets by anticoagulation protocol: Yes   Plan:  1. Continue heparin drip at current rate of 2300 units/hr (23 ml/hr) 2. Will continue to follow restart plans for warfarin 3. Will continue to monitor for any  signs/symptoms of bleeding and will follow up with heparin level in 6 hours to confirm therapeutic  Georgina Pillion, PharmD, BCPS Clinical Pharmacist Pager: [phone] 06/12/2012 9:Cody AM

## 2012-06-12 NOTE — Progress Notes (Signed)
TCTS  Will follow result of perc pigtail drain appears to be working- CXR ordered

## 2012-06-12 NOTE — Progress Notes (Signed)
Subjective: Pt feels ok, sore  Objective: Physical Exam: BP 126/72  Pulse 100  Temp(Src) 98.1 F (36.7 C) (Oral)  Resp 20  Ht 5\' 8"  (1.727 m)  Wt 184 lb 9.6 oz (83.734 kg)  BMI 28.07 kg/m2  SpO2 93% (L)chest drain intact, site clean, mildly sore at site. Output cloudy, about in Pleurevac, no airleak   Labs: CBC  Recent Labs  06/11/12 0257 06/12/12 0836  WBC 11.1* 12.4*  HGB 10.1* 11.5*  HCT 31.1* 34.8*  PLT 351 377   BMET  Recent Labs  06/10/12 0620 06/11/12 0257  NA 140 136  K 3.1* 3.4*  CL 100 98  CO2 34* 32  GLUCOSE 106* 110*  BUN 10 8  CREATININE 0.73 0.69  CALCIUM 8.9 8.5   LFT  Recent Labs  06/11/12 1148  PROT 6.8   PT/INR  Recent Labs  06/11/12 0257 06/12/12 0650  LABPROT 18.4* 15.2  INR 1.58* 1.22     Studies/Results: Ct Perc Pleural Drain W/indwell Cath W/img Guide  06/11/2012  *RADIOLOGY REPORT*  Clinical Data: Left-sided pleural empyema.  The patient requires drainage catheter placement.  CT GUIDED LEFT CHEST TUBE THORACOSTOMY FOR EMPYEMA  Sedation:  2.0 mg IV Versed; 100 mcg IV Fentanyl  Total Moderate Sedation Time: 25 minutes.  Procedure:  The procedure, risks, benefits, and alternatives were explained to the patient.  Questions regarding the procedure were encouraged and answered. The patient understands and consents to the procedure.  The left posterior chest wall was prepped with Betadine in a sterile fashion, and a sterile drape was applied covering the operative field.  A sterile gown and sterile gloves were used for the procedure. Local anesthesia was provided with 1% Lidocaine.  The patient was placed in a prone and slightly rolled position to access the posterior left chest. An 18 gauge trocar needle was advanced under CT guidance into the posterior left pleural space. A guidewire was advanced.  Over a wire, tract dilatation was performed and a 14-French pigtail drainage catheter placed. Catheter position was confirmed by CT.   The catheter was aspirated and a sample of fluid obtained for laboratory studies.  The tube was flushed and connected to a [place] Pleur-Evac device.  The catheter was secured at the skin with a retention suture and Stat- Lock device.  Complications: None.  Findings: Aspiration of fluid in the left posterior pleural empyema revealed grossly purulent and foul-smelling fluid.  No large sample was collected for multiple requested laboratory tests.  After 14- French drainage catheter placement, there is rapid drainage of fluid.  The [place] Pleur-Evac device will be connected to the low wall suction at a pressure of -20 cm of water.  IMPRESSION: CT guided left chest thoracostomy with placement of a 14-French pigtail drainage catheter into the posterior empyema.  Grossly purulent fluid return was noted with a large sample sent for multiple requested laboratory tests.   Original Report Authenticated By: Irish Lack, M.D.     Assessment/Plan: (L) empyema, s/p CT guided left chest tube thoracostomy. Continue to follow along with primary teams.   LOS: 9 days    Brayton El PA-C 06/12/2012 9:47 AM

## 2012-06-12 NOTE — Progress Notes (Signed)
PT Cancellation Note  Patient Details Name: Cody [name] MRN: [id number] DOB: November 19, 1950   Cancelled Treatment:    Reason Eval/Treat Not Completed: Other (comment) (pt stated he's not up for walking today 2* having chest tube). Will follow.   Ralene Bathe Kistler 06/12/2012, 10:10 AM [phone]

## 2012-06-13 ENCOUNTER — Inpatient Hospital Stay (HOSPITAL_COMMUNITY): Payer: Medicaid Other

## 2012-06-13 LAB — CBC
HCT: 30.6 % — ABNORMAL LOW (ref 39.0–52.0)
Hemoglobin: 10.1 g/dL — ABNORMAL LOW (ref 13.0–17.0)
MCH: 29.1 pg (ref 26.0–34.0)
MCHC: 33 g/dL (ref 30.0–36.0)
MCV: 88.2 fL (ref 78.0–100.0)
RDW: 15.7 % — ABNORMAL HIGH (ref 11.5–15.5)

## 2012-06-13 LAB — HEPARIN LEVEL (UNFRACTIONATED)
Heparin Unfractionated: 0.2 IU/mL — ABNORMAL LOW (ref 0.30–0.70)
Heparin Unfractionated: 0.37 IU/mL (ref 0.30–0.70)

## 2012-06-13 LAB — BASIC METABOLIC PANEL
BUN: 9 mg/dL (ref 6–23)
Creatinine, Ser: 0.75 mg/dL (ref 0.50–1.35)
GFR calc non Af Amer: >90 mL/min (ref >90)
Glucose, Bld: 97 mg/dL (ref 70–99)
Potassium: 3.1 mEq/L — ABNORMAL LOW (ref 3.5–5.1)

## 2012-06-13 MED ORDER — HEPARIN (PORCINE) IN NACL 100-0.45 UNIT/ML-% IJ SOLN
2350.0000 [IU]/h | INTRAMUSCULAR | Status: DC
  Administered 2012-06-13 – 2012-06-16 (×7): 2350 [IU]/h via INTRAVENOUS
  Filled 2012-06-13 (×8): qty 250

## 2012-06-13 NOTE — Progress Notes (Signed)
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 Protamine Anaphylaxis    Patient Measurements: Height: 5\' 8"  (172.7 cm) Weight: 183 lb 11.2 oz (83.326 kg) IBW/kg (Calculated) : 68.4  Vital Signs: Temp: 98.4 F (36.9 C) (03/08 1918) Temp src: Oral (03/08 1918) BP: 114/81 mmHg (03/08 1918) Pulse Rate: 91 (03/08 1918)  Labs:  Recent Labs  06/11/12 0257  06/12/12 0650 06/12/12 0836  06/13/12 0435 06/13/12 1242 06/13/12 2020  HGB 10.1*  --   --  11.5*  --  10.1*  --   --   HCT 31.1*  --   --  34.8*  --  30.6*  --   --   PLT 351  --   --  377  --  353  --   --   LABPROT 18.4*  --  15.2  --   --   --   --   --   INR 1.58*  --  1.22  --   --   --   --   --   HEPARINUNFRC <0.10*  < >  --  0.36  < > 0.20* >2.00* 0.37  CREATININE 0.69  --   --   --   --  0.75  --   --   < > = values in this interval not displayed.  Estimated Creatinine Clearance: 98.2 ml/min (by C-G formula based on Cr of 0.75).   Medications:  Scheduled:  . antiseptic oral rinse  1 application Mouth Rinse QID  . chlorhexidine  15 mL Mouth/Throat BID  . feeding supplement  1 Container Oral TID BM  . feeding supplement  30 mL Oral BID  . multivitamin with minerals  1 tablet Oral Daily  . piperacillin-tazobactam (ZOSYN)  IV  3.375 g Intravenous Q8H    Assessment: 62 y.o M on heparin bridge while warfarin on hold for hx recent DVT/PE in Jan '14. The patient went for CT guided left chest empyema drainage on 3/6 and the patient still has a chest tube in place.   PM heparin level therapeutic at 0.37  Goal of Therapy:  Heparin level 0.3-0.7 units/ml Monitor platelets by anticoagulation protocol: Yes   Plan:  1. Continue heparin gtt at 2350 units/hour 2. Follow up AM labs  Thank you. Okey Regal, PharmD [phone]  06/13/2012 9:30 PM  :

## 2012-06-13 NOTE — Progress Notes (Signed)
Subjective: Pt feels ok, sore  Objective: Physical Exam: BP 123/79  Pulse 73  Temp(Src) 98.3 F (36.8 C) (Oral)  Resp 20  Ht 5\' 8"  (1.727 m)  Wt 183 lb 11.2 oz (83.326 kg)  BMI 27.94 kg/m2  SpO2 96% (L)chest drain intact, site clean, mildly sore at site. Output still bloody purulent, up to total in Pleurevac, (+)airleak noted today   Labs: CBC  Recent Labs  06/12/12 0836 06/13/12 0435  WBC 12.4* 11.3*  HGB 11.5* 10.1*  HCT 34.8* 30.6*  PLT 377 353   BMET  Recent Labs  06/11/12 0257 06/13/12 0435  NA 136 140  K 3.4* 3.1*  CL 98 102  CO2 32 34*  GLUCOSE 110* 97  BUN 8 9  CREATININE 0.69 0.75  CALCIUM 8.5 8.6   LFT  Recent Labs  06/11/12 1148  PROT 6.8   PT/INR  Recent Labs  06/11/12 0257 06/12/12 0650  LABPROT 18.4* 15.2  INR 1.58* 1.22     Studies/Results: Dg Chest 2 View  06/12/2012  *RADIOLOGY REPORT*  Clinical Data: Left empyema, small bore chest tube  CHEST - 2 VIEW  Comparison: 06/10/2012  Findings: Tracheostomy in the mid trachea.  Posterior left chest pigtail chest drain noted.  Mild cardiac enlargement with central vascular congestion.  Lingula and left base airspace disease/consolidation persist with slight interval improvement. Improvement in the left effusion also.  IMPRESSION: Improved left base aeration following insertion of the posterior left chest tube for a known empyema.   Original Report Authenticated By: Judie Petit. Miles Costain, M.D.    Ct Perc Pleural Drain W/indwell Cath W/img Guide  06/11/2012  *RADIOLOGY REPORT*  Clinical Data: Left-sided pleural empyema.  The patient requires drainage catheter placement.  CT GUIDED LEFT CHEST TUBE THORACOSTOMY FOR EMPYEMA  Sedation:  2.0 mg IV Versed; 100 mcg IV Fentanyl  Total Moderate Sedation Time: 25 minutes.  Procedure:  The procedure, risks, benefits, and alternatives were explained to the patient.  Questions regarding the procedure were encouraged and answered. The patient understands and  consents to the procedure.  The left posterior chest wall was prepped with Betadine in a sterile fashion, and a sterile drape was applied covering the operative field.  A sterile gown and sterile gloves were used for the procedure. Local anesthesia was provided with 1% Lidocaine.  The patient was placed in a prone and slightly rolled position to access the posterior left chest. An 18 gauge trocar needle was advanced under CT guidance into the posterior left pleural space. A guidewire was advanced.  Over a wire, tract dilatation was performed and a 14-French pigtail drainage catheter placed. Catheter position was confirmed by CT.  The catheter was aspirated and a sample of fluid obtained for laboratory studies.  The tube was flushed and connected to a [place] Pleur-Evac device.  The catheter was secured at the skin with a retention suture and Stat- Lock device.  Complications: None.  Findings: Aspiration of fluid in the left posterior pleural empyema revealed grossly purulent and foul-smelling fluid.  No large sample was collected for multiple requested laboratory tests.  After 14- French drainage catheter placement, there is rapid drainage of fluid.  The [place] Pleur-Evac device will be connected to the low wall suction at a pressure of -20 cm of water.  IMPRESSION: CT guided left chest thoracostomy with placement of a 14-French pigtail drainage catheter into the posterior empyema.  Grossly purulent fluid return was noted with a large sample sent for multiple requested laboratory  tests.   Original Report Authenticated By: Irish Lack, M.D.     Assessment/Plan: (L) empyema, s/p CT guided left chest tube thoracostomy. Continue to follow along with primary teams.   LOS: 10 days    Brayton El PA-C 06/13/2012 9:01 AM

## 2012-06-13 NOTE — Progress Notes (Signed)
Name: Cody [name]  MRN: [id number]  DOB: [birth year]  ADMISSION DATE: 05/29/2012   CONSULTATION DATE: 2/25 rehab, now admission 2/26  BRIEF PATIENT DESCRIPTION: 62 y/o M with PMH of COPD, GERD, HLD, HTN, CVA admitted 1/5-2/21 with VDRF secondary to influenza A and aspiration PNA. Required trach 1/22. Hospital course complicated by new PE, Bilateral DVT s/p IVC filter 1/27. 2/24 noted to have worsened LLL atx on cxr. 2/25 mild temp (100.4) and worsened cxr. PCCM consulted for concern of LLL PNA.   CULTURES:  2/25 Sputum >> MSSA 2/26 bronch BAL>>> negative Blood 2/27 >> neg 2/28 C diff >> NEG 3/06 Lt pleural fluid >>   ANTIBIOTICS:  Zosyn 2/25>  vanc 2/26 >>3/1  SIGNIFICANT EVENTS / STUDIES:  1/5-2/21 - Admit to [facility] for VDRF 2/2 flu, asp PNA s/p trach  2/26 retrach, bronch, distress, vent needed 2/27 off full support. ATC 2/28 back on to full support 2/28  Back to ATC 3/03 PEG pulled out, passed for D1, pudding thick liquid  3/04: ct chest >> Large cavitary left lower lung region consistent with empyema, increased ASD in lingula, mediastinal LAN 3/05: Consulted TCTS 3/06: IR placed catheter Lt pleural space >> LDH > 10K, Protein 3.3, WBC 26,800 (79%N)  SUBJECTIVE:   Breathing better.  Sore at chest tube site.  VITAL SIGNS: Temp:  [98 F (36.7 C)-98.3 F (36.8 C)] 98.3 F (36.8 C) (03/08 0557) Pulse Rate:  [69-101] 73 (03/08 0557) Resp:  [16-20] 20 (03/08 0557) BP: (123-145)/(72-89) 123/79 mmHg (03/08 0557) SpO2:  [92 %-98 %] 96 % (03/08 0557) FiO2 (%):  [35 %] 35 % (03/08 0346) Weight:  [83.326 kg (183 lb 11.2 oz)] 83.326 kg (183 lb 11.2 oz) (03/07 2032)  INTAKE / OUTPUT: Intake/Output     03/07 0701 - 03/08 0700 03/08 0701 - 03/09 0700   P.O. 120    I.V. (mL/kg) 1729.6 (20.8)    IV Piggyback 150    Total Intake(mL/kg) 1999.6 (24)    Urine (mL/kg/hr) 200 (0.1)    Chest Tube 52 (0)    Total Output 252     Net +1747.6          Urine Occurrence 4 x    Stool Occurrence 5 x     PHYSICAL EXAMINATION:  General:  Chronically ill in NAD Neuro:  Alert, normal strength HEENT:  Trach clean, weak but improved phonation with PMV Cardiovascular:  s1 s2 RRR Lungs:  Decreased in left  35% ATC Abdomen:  Soft, BS wnl Musculoskeletal:  No edema Skin:  No rash  Recent Labs Lab 06/10/12 0620 06/11/12 0257 06/13/12 0435  NA 140 136 140  K 3.1* 3.4* 3.1*  CL 100 98 102  CO2 34* 32 34*  BUN 10 8 9   CREATININE 0.73 0.69 0.75  GLUCOSE 106* 110* 97    Recent Labs Lab 06/11/12 0257 06/12/12 0836 06/13/12 0435  HGB 10.1* 11.5* 10.1*  HCT 31.1* 34.8* 30.6*  WBC 11.1* 12.4* 11.3*  PLT 351 377 353   Lab Results  Component Value Date   INR 1.22 06/12/2012   INR 1.58* 06/11/2012   INR 1.83* 06/10/2012   Imaging: Dg Chest 2 View  06/12/2012  *RADIOLOGY REPORT*  Clinical Data: Left empyema, small bore chest tube  CHEST - 2 VIEW  Comparison: 06/10/2012  Findings: Tracheostomy in the mid trachea.  Posterior left chest pigtail chest drain noted.  Mild cardiac enlargement with central vascular congestion.  Lingula and left base airspace disease/consolidation  persist with slight interval improvement. Improvement in the left effusion also.  IMPRESSION: Improved left base aeration following insertion of the posterior left chest tube for a known empyema.   Original Report Authenticated By: Judie Petit. Miles Costain, M.D.    Ct Perc Pleural Drain W/indwell Cath W/img Guide  06/11/2012  *RADIOLOGY REPORT*  Clinical Data: Left-sided pleural empyema.  The patient requires drainage catheter placement.  CT GUIDED LEFT CHEST TUBE THORACOSTOMY FOR EMPYEMA  Sedation:  2.0 mg IV Versed; 100 mcg IV Fentanyl  Total Moderate Sedation Time: 25 minutes.  Procedure:  The procedure, risks, benefits, and alternatives were explained to the patient.  Questions regarding the procedure were encouraged and answered. The patient understands and consents to the procedure.  The left posterior chest wall was  prepped with Betadine in a sterile fashion, and a sterile drape was applied covering the operative field.  A sterile gown and sterile gloves were used for the procedure. Local anesthesia was provided with 1% Lidocaine.  The patient was placed in a prone and slightly rolled position to access the posterior left chest. An 18 gauge trocar needle was advanced under CT guidance into the posterior left pleural space. A guidewire was advanced.  Over a wire, tract dilatation was performed and a 14-French pigtail drainage catheter placed. Catheter position was confirmed by CT.  The catheter was aspirated and a sample of fluid obtained for laboratory studies.  The tube was flushed and connected to a [place] Pleur-Evac device.  The catheter was secured at the skin with a retention suture and Stat- Lock device.  Complications: None.  Findings: Aspiration of fluid in the left posterior pleural empyema revealed grossly purulent and foul-smelling fluid.  No large sample was collected for multiple requested laboratory tests.  After 14- French drainage catheter placement, there is rapid drainage of fluid.  The [place] Pleur-Evac device will be connected to the low wall suction at a pressure of -20 cm of water.  IMPRESSION: CT guided left chest thoracostomy with placement of a 14-French pigtail drainage catheter into the posterior empyema.  Grossly purulent fluid return was noted with a large sample sent for multiple requested laboratory tests.   Original Report Authenticated By: Irish Lack, M.D.     ASSESSMENT / PLAN:  Aspiration pneumonia with empyema. Chronic respiratory failure s/p tracheostomy (#6 cuffless). PE with b/l lower extremity DVT acquired in hospital. Hx of COPD. P:   Continue trach collar with goal Spo2 > 92% F/u CXR => improved left basilar aeration 3/7 film Continue chest tube  F/u pleural fluid culture results Continue heparin gtt for DVT/PE until interventions for pleural disease completed >> then  transition to coumadin Continue Zosyn pending culture results Continue speech valve as tolerated  PRN BD's   Anemia of chronic disease. P:  F/u CBC => 3/8 Hg=10.1  Dysphagia  P: D1 diet >> f/u with speech  Insomnia/anxiety. Improved with change from [place] to trazodone P: -continue trazodone at at bedtime -caution with sedating medications  Hx of HTN. P: Monitor blood pressure for now  Dispostion >> Will need SNF placement after intervention for empyema completed  Resolved problems >> acute respiratory failure, influenza A pneumonia, diarrhea   Scott M. Kriste Basque, MD [facility] 06/13/2012, 8:53 AM

## 2012-06-13 NOTE — Progress Notes (Signed)
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 Protamine Anaphylaxis    Patient Measurements: Height: 5\' 8"  (172.7 cm) Weight: 183 lb 11.2 oz (83.326 kg) IBW/kg (Calculated) : 68.4  Vital Signs: Temp: 97.9 F (36.6 C) (03/08 0935) Temp src: Oral (03/08 0935) BP: 123/75 mmHg (03/08 0935) Pulse Rate: 89 (03/08 0935)  Labs:  Recent Labs  06/10/12 1755  06/11/12 0257  06/12/12 0650 06/12/12 0836  06/12/12 2330 06/13/12 0435 06/13/12 1242  HGB  --   < > 10.1*  --   --  11.5*  --   --  10.1*  --   HCT  --   --  31.1*  --   --  34.8*  --   --  30.6*  --   PLT  --   --  351  --   --  377  --   --  353  --   LABPROT 20.5*  --  18.4*  --  15.2  --   --   --   --   --   INR 1.83*  --  1.58*  --  1.22  --   --   --   --   --   HEPARINUNFRC  --   --  <0.10*  < >  --  0.36  < > 0.41 0.20* >2.00*  CREATININE  --   --  0.69  --   --   --   --   --  0.75  --   < > = values in this interval not displayed.  Estimated Creatinine Clearance: 98.2 ml/min (by C-G formula based on Cr of 0.75).   Medications:  Scheduled:  . antiseptic oral rinse  1 application Mouth Rinse QID  . chlorhexidine  15 mL Mouth/Throat BID  . feeding supplement  1 Container Oral TID BM  . feeding supplement  30 mL Oral BID  . multivitamin with minerals  1 tablet Oral Daily  . piperacillin-tazobactam (ZOSYN)  IV  3.375 g Intravenous Q8H    Assessment: 62 y.o M on heparin bridge while warfarin on hold for hx recent DVT/PE in Jan '14. The patient went for CT guided left chest empyema drainage on 3/6 and the patient still has a chest tube in place. Heparin level this AM was 0.2, however, per RN issues with losing access for extended period of time overnight. Repeat HL this afternoon >2, no new meds or changes per RN, assuming lab error, no bleeding noted  .Warfarin remains on hold  per CCM "until interventions for pleural disease completed". Hgb/Hct/Plt stable -- no overt s/sx of bleeding noted.   Goal of Therapy:  Heparin level 0.3-0.7 units/ml Monitor platelets by anticoagulation protocol: Yes   Plan:   1. Will  Decrease heparin gtt to 2350 units/hour 2.Will get 6 hr level to confirm 3.Monitor s/sx of bleeding 4. F/u transition to coumadin  Bola A. Wandra Feinstein D Clinical Pharmacist Pager:[phone] Phone [phone] 06/13/2012 2:53 PM  :

## 2012-06-13 NOTE — Progress Notes (Addendum)
ANTICOAGULATION CONSULT NOTE - Follow Up Consult  Pharmacy Consult for Heparin bridge while Warfarin on hold Indication: Hx recent PE/DVT (Jan '14)  Allergies  Allergen Reactions  . Protamine Anaphylaxis    Patient Measurements: Height: 5\' 8"  (172.7 cm) Weight: 183 lb 11.2 oz (83.326 kg) IBW/kg (Calculated) : 68.4 Heparin Dosing Weight: 82.6 kg  Vital Signs: Temp: 98.2 F (36.8 C) (03/07 2032) Temp src: Oral (03/07 2032) BP: 143/89 mmHg (03/07 2032) Pulse Rate: 91 (03/07 2342)  Labs:  Recent Labs  06/10/12 0620 06/10/12 1755 06/11/12 0257  06/12/12 0650 06/12/12 0836 06/12/12 1525 06/12/12 2330  HGB 11.4*  --  10.1*  --   --  11.5*  --   --   HCT 35.5*  --  31.1*  --   --  34.8*  --   --   PLT 423*  --  351  --   --  377  --   --   LABPROT 22.8* 20.5* 18.4*  --  15.2  --   --   --   INR 2.11* 1.83* 1.58*  --  1.22  --   --   --   HEPARINUNFRC  --   --  <0.10*  < >  --  0.36 0.27* 0.41  CREATININE 0.73  --  0.69  --   --   --   --   --   < > = values in this interval not displayed.  Estimated Creatinine Clearance: 98.2 ml/min (by C-G formula based on Cr of 0.69).  Assessment: 62 y.o M on heparin bridge while warfarin on hold for hx recent DVT/PE in Jan '14. The patient went for CT guided left chest empyema drainage on 3/6 and the patient still has a chest tube in place.   Heparin level this AM is 0.2, however, per RN issues with losing access for extended period of time overnight. IV access regained, and heparin is now infusing without issue.   Goal of Therapy:  Heparin level 0.3-0.7 units/ml Monitor platelets by anticoagulation protocol: Yes   Plan:  1. Continue IV heparin at 2450 units/hr. 2. Heparin level in 6 hours.   Lorre Munroe, PharmD  06/13/2012 12:13 AM

## 2012-06-14 LAB — CBC
HCT: 33 % — ABNORMAL LOW (ref 39.0–52.0)
Hemoglobin: 10.6 g/dL — ABNORMAL LOW (ref 13.0–17.0)
MCH: 29 pg (ref 26.0–34.0)
RBC: 3.66 MIL/uL — ABNORMAL LOW (ref 4.22–5.81)

## 2012-06-14 LAB — HEPARIN LEVEL (UNFRACTIONATED): Heparin Unfractionated: 0.48 IU/mL (ref 0.30–0.70)

## 2012-06-14 MED ORDER — OXYCODONE HCL 5 MG PO TABS
5.0000 mg | ORAL_TABLET | Freq: Four times a day (QID) | ORAL | Status: DC | PRN
  Administered 2012-06-14 – 2012-06-18 (×10): 5 mg via ORAL
  Filled 2012-06-14 (×10): qty 1

## 2012-06-14 NOTE — Progress Notes (Signed)
Name: Cody [name]  MRN: [id number]  DOB: 07-22-1950  ADMISSION DATE: 05/29/2012   CONSULTATION DATE: 2/25 rehab, now admission 2/26  BRIEF PATIENT DESCRIPTION: 62 y/o M with PMH of COPD, GERD, HLD, HTN, CVA admitted 1/5-2/21 with VDRF secondary to influenza A and aspiration PNA. Required trach 1/22. Hospital course complicated by new PE, Bilateral DVT s/p IVC filter 1/27. 2/24 noted to have worsened LLL atx on cxr. 2/25 mild temp (100.4) and worsened cxr. PCCM consulted for concern of LLL PNA.   CULTURES:  2/25 Sputum >> MSSA 2/26 bronch BAL>>> negative Blood 2/27 >> neg 2/28 C diff >> NEG 3/06 Lt pleural fluid >> neg to date  ANTIBIOTICS:  Zosyn 2/25>  vanc 2/26 >>3/1  SIGNIFICANT EVENTS / STUDIES:  1/5-2/21 - Admit to [facility] for [facility]DR[facility] 2/2 flu, asp PNA s/p trach  2/26 retrach, bronch, distress, vent needed 2/27 off full support. ATC 2/28 back on to full support 2/28  Back to ATC 3/03 PEG pulled out, passed for D1, pudding thick liquid  3/04: ct chest >> Large cavitary left lower lung region consistent with empyema, increased ASD in lingula, mediastinal LAN 3/05: Consulted TCTS 3/06: IR placed catheter Lt pleural space >> LDH > 10K, Protein 3.3, WBC 26,800 (79%N)  SUBJECTIVE:   Breathing better.  Sore at chest tube site.  VITAL SIGNS: Temp:  [98.4 F (36.9 C)-98.6 F (37 C)] 98.6 F (37 C) (03/08 2219) Pulse Rate:  [70-108] 92 (03/09 0303) Resp:  [18-22] 18 (03/09 0303) BP: (114-153)/(81-98) 144/83 mmHg (03/08 2219) SpO2:  [92 %-99 %] 92 % (03/09 0303) FiO2 (%):  [35 %] 35 % (03/09 0303) Weight:  [84.052 kg (185 lb 4.8 oz)] 84.052 kg (185 lb 4.8 oz) (03/08 2219)  INTAKE / OUTPUT: Intake/Output     03/08 0701 - 03/09 0700 03/09 0701 - 03/10 0700   P.O. 0    I.V. (mL/kg)     IV Piggyback     Total Intake(mL/kg)     Urine (mL/kg/hr) 700 (0.3)    Stool 1 (0)    Chest Tube     Total Output 701     Net -701           PHYSICAL EXAMINATION:  General:   Chronically ill in NAD Neuro:  Alert, normal strength HEENT:  Trach clean, weak but improved phonation with PMV Cardiovascular:  s1 s2 RRR Lungs:  Decreased in left  35% ATC Abdomen:  Soft, BS wnl Musculoskeletal:  No edema Skin:  No rash  Recent Labs Lab 06/10/12 0620 06/11/12 0257 06/13/12 0435  NA 140 136 140  K 3.1* 3.4* 3.1*  CL 100 98 102  CO2 34* 32 34*  BUN 10 8 9   CREATININE 0.73 0.69 0.75  GLUCOSE 106* 110* 97    Recent Labs Lab 06/12/12 0836 06/13/12 0435 06/14/12 0510  HGB 11.5* 10.1* 10.6*  HCT 34.8* 30.6* 33.0*  WBC 12.4* 11.3* 13.0*  PLT 377 353 372   Lab Results  Component Value Date   INR 1.22 06/12/2012   INR 1.58* 06/11/2012   INR 1.83* 06/10/2012   Imaging: Dg Chest 2 View  06/13/2012  *RADIOLOGY REPORT*  Clinical Data: Follow-up for left-sided empyema.  CHEST - 2 VIEW  Comparison: Chest x-ray 06/12/2012.  Findings: Tracheostomy tube is in position 6.9 cm above the carina. Left sided pigtail drainage catheter is similarly positioned with tip projecting over the posterior aspect of the lower left hemithorax.  A complex multilocular left-sided pleural  fluid collection is redemonstrated with evidence of volume loss and no architectural distortion in the left lower lobe.  Persistent areas of airspace consolidation in the lingula and left lower lobe are also suspect.  Right lung appears to be clear.  Prominent right nipple shadow incidentally noted.  Mild pulmonary venous congestion, without frank pulmonary edema.  Heart size is upper limits of normal.  IMPRESSION: 1.  Allowing for slight differences in patient positioning, the radiographic appearance of the chest is essentially unchanged, as above.   Original Report Authenticated By: Trudie Reed, M.D.    Dg Chest 2 View  06/12/2012  *RADIOLOGY REPORT*  Clinical Data: Left empyema, small bore chest tube  CHEST - 2 VIEW  Comparison: 06/10/2012  Findings: Tracheostomy in the mid trachea.  Posterior left chest  pigtail chest drain noted.  Mild cardiac enlargement with central vascular congestion.  Lingula and left base airspace disease/consolidation persist with slight interval improvement. Improvement in the left effusion also.  IMPRESSION: Improved left base aeration following insertion of the posterior left chest tube for a known empyema.   Original Report Authenticated By: Judie Petit. Shick, M.D.     ASSESSMENT / PLAN:  Aspiration pneumonia with empyema. Chronic respiratory failure s/p tracheostomy (#6 cuffless). PE with b/l lower extremity DVT acquired in hospital. Hx of COPD. P:   Continue trach collar with goal Spo2 > 92% F/u CXR => improved left basilar aeration 3/7 film Continue chest tube  F/u pleural fluid culture results => neg to date Continue heparin gtt for DVT/PE until interventions for pleural disease completed >> then transition to coumadin Continue Zosyn pending culture results Continue speech valve as tolerated  PRN BD's   Anemia of chronic disease. P:  F/u CBC => 3/8 Hg=10.1  Dysphagia  P: D1 diet >> f/u with speech  Insomnia/anxiety. Improved with change from [place] to trazodone P: -continue trazodone at at bedtime -caution with sedating medications  Hx of HTN. P: Monitor blood pressure for now  Dispostion >> Will need SNF placement after intervention for empyema completed  Resolved problems >> acute respiratory failure, influenza A pneumonia, diarrhea   Khalif Stender M. Kriste Basque, MD [facility] Pulmonary/Critical Care 06/14/2012, 10:21 AM

## 2012-06-14 NOTE — Progress Notes (Addendum)
ANTICOAGULATION and ANTIBIOTIC CONSULT NOTE - Follow Up Consult  PharmacyConsult for Heparin bridge while Warfarin on hold & Zosyn Indication: Hx recent PE/DVT (Jan '14)  Allergies  Allergen Reactions  . Protamine Anaphylaxis    Patient Measurements: Height: 5\' 8"  (172.7 cm) Weight: 185 lb 4.8 oz (84.052 kg) IBW/kg (Calculated) : 68.4  Vital Signs: Temp: 98.6 F (37 C) (03/08 2219) Temp src: Oral (03/08 2219) BP: 144/83 mmHg (03/08 2219) Pulse Rate: 92 (03/09 0303)  Labs:  Recent Labs  06/12/12 0650  06/12/12 0836  06/13/12 0435 06/13/12 1242 06/13/12 2020 06/14/12 0510  HGB  --   < > 11.5*  --  10.1*  --   --  10.6*  HCT  --   --  34.8*  --  30.6*  --   --  33.0*  PLT  --   --  377  --  353  --   --  372  LABPROT 15.2  --   --   --   --   --   --   --   INR 1.22  --   --   --   --   --   --   --   HEPARINUNFRC  --   --  0.36  < > 0.20* >2.00* 0.37 0.48  CREATININE  --   --   --   --  0.75  --   --   --   < > = values in this interval not displayed.  Estimated Creatinine Clearance: 98.6 ml/min (by C-G formula based on Cr of 0.75).   Medications:  Scheduled:  . antiseptic oral rinse  1 application Mouth Rinse QID  . chlorhexidine  15 mL Mouth/Throat BID  . feeding supplement  1 Container Oral TID BM  . feeding supplement  30 mL Oral BID  . multivitamin with minerals  1 tablet Oral Daily  . piperacillin-tazobactam (ZOSYN)  IV  3.375 g Intravenous Q8H    Assessment: 62 y.o M on heparin bridge while warfarin on hold for hx recent DVT/PE in Jan '14. The patient went for CT guided left chest empyema drainage on 3/6 and the patient still has a chest tube in place.   Warfarin remains on hold per CCM "until interventions for pleural disease completed". Hgb/Hct/Plt stable -- no overt s/sx of bleeding noted.   Pt on Zosyn since 2/25 for aspiration PNA with empyema, cultures have been negative.  AM heparin level therapeutic at 0.48  Goal of Therapy:  Heparin level  0.3-0.7 units/ml Monitor platelets by anticoagulation protocol: Yes   Plan:  1. Continue heparin gtt at 2350 units/hour 2. Follow up daily labs 3.Monitor s/sx of bleeding  4. F/u transition to coumadin 5. Continue Zosyn 3.375G IV q8h to be infused over 4 hours   Vania Rea. Darin Engels.D. Clinical Pharmacist Pager [phone] Phone [phone] 06/14/2012 7:48 AM    :

## 2012-06-15 ENCOUNTER — Inpatient Hospital Stay (HOSPITAL_COMMUNITY): Payer: Medicaid Other

## 2012-06-15 LAB — CBC
HCT: 30.3 % — ABNORMAL LOW (ref 39.0–52.0)
Hemoglobin: 9.8 g/dL — ABNORMAL LOW (ref 13.0–17.0)
MCH: 29.1 pg (ref 26.0–34.0)
MCHC: 32.3 g/dL (ref 30.0–36.0)
MCV: 89.9 fL (ref 78.0–100.0)
Platelets: 351 K/uL (ref 150–400)
RBC: 3.37 MIL/uL — ABNORMAL LOW (ref 4.22–5.81)
RDW: 15.6 % — ABNORMAL HIGH (ref 11.5–15.5)
WBC: 15.5 K/uL — ABNORMAL HIGH (ref 4.0–10.5)

## 2012-06-15 LAB — HEPARIN LEVEL (UNFRACTIONATED): Heparin Unfractionated: 0.32 IU/mL (ref 0.30–0.70)

## 2012-06-15 NOTE — Progress Notes (Signed)
ANTICOAGULATION CONSULT NOTE - Follow Up Consult  Pharmacy Consult for Heparin bridge while Warfarin on hold Indication: Hx recent PE/DVT (Jan '14)  Allergies  Allergen Reactions  . Protamine Anaphylaxis    Patient Measurements: Height: 5\' 8"  (172.7 cm) Weight: 185 lb 4.8 oz (84.052 kg) IBW/kg (Calculated) : 68.4 Heparin Dosing Weight: 82.6 kg  Vital Signs: Temp: 98.3 F (36.8 C) (03/10 0622) Temp src: Oral (03/10 0622) BP: 145/87 mmHg (03/10 0622) Pulse Rate: 94 (03/10 0622)  Labs:  Recent Labs  06/13/12 0435  06/13/12 2020 06/14/12 0510 06/15/12 0519  HGB 10.1*  --   --  10.6* 9.8*  HCT 30.6*  --   --  33.0* 30.3*  PLT 353  --   --  372 351  HEPARINUNFRC 0.20*  < > 0.37 0.48 0.32  CREATININE 0.75  --   --   --   --   < > = values in this interval not displayed.  Estimated Creatinine Clearance: 98.6 ml/min (by C-G formula based on Cr of 0.75).  Assessment: 62 y.o M on heparin bridge while warfarin on hold for hx recent DVT/PE in Jan '14. The patient went for CT guided left chest empyema drainage on 3/6, with chest tube still in place and on negative suction. Heparin level this morning is therapeutic (HL 0.32 << 0.36, goal of 0.3-0.7). Warfarin remains on hold per CCM "until interventions for pleural disease completed". Hgb/Hct/Plt stable -- no overt s/sx of bleeding noted.   Goal of Therapy:  Heparin level 0.3-0.7 units/ml Monitor platelets by anticoagulation protocol: Yes   Plan:  1. Continue heparin drip at current rate of 2350 units/hr (23.5 ml/hr) 2. Will continue to follow restart plans for warfarin 3. Will continue to monitor for any signs/symptoms of bleeding and will follow up with heparin level in the a.m.   Georgina Pillion, PharmD, BCPS Clinical Pharmacist Pager: [phone] 06/15/2012 8:19 AM

## 2012-06-15 NOTE — Progress Notes (Signed)
Subjective: Pt feeling ok. Does c/o pain in upper left chest region. Minimal soreness from drain  Objective: Physical Exam: BP 145/87  Pulse 94  Temp(Src) 98.3 F (36.8 C) (Oral)  Resp 17  Ht 5\' 8"  (1.727 m)  Wt 185 lb 4.8 oz (84.052 kg)  BMI 28.18 kg/m2  SpO2 96% (L)post chest drain intact, dressing loose, but dry. Output up to total, still purulent. Still (+)airleak with cough.   Labs: CBC  Recent Labs  06/14/12 0510 06/15/12 0519  WBC 13.0* 15.5*  HGB 10.6* 9.8*  HCT 33.0* 30.3*  PLT 372 351   BMET  Recent Labs  06/13/12 0435  NA 140  K 3.1*  CL 102  CO2 34*  GLUCOSE 97  BUN 9  CREATININE 0.75  CALCIUM 8.6   LFT No results found for this basename: PROT, ALBUMIN, AST, ALT, ALKPHOS, BILITOT, BILIDIR, IBILI, LIPASE,  in the last 72 hours PT/INR No results found for this basename: LABPROT, INR,  in the last 72 hours   Studies/Results: Dg Chest 2 View  06/13/2012  *RADIOLOGY REPORT*  Clinical Data: Follow-up for left-sided empyema.  CHEST - 2 VIEW  Comparison: Chest x-ray 06/12/2012.  Findings: Tracheostomy tube is in position 6.9 cm above the carina. Left sided pigtail drainage catheter is similarly positioned with tip projecting over the posterior aspect of the lower left hemithorax.  A complex multilocular left-sided pleural fluid collection is redemonstrated with evidence of volume loss and no architectural distortion in the left lower lobe.  Persistent areas of airspace consolidation in the lingula and left lower lobe are also suspect.  Right lung appears to be clear.  Prominent right nipple shadow incidentally noted.  Mild pulmonary venous congestion, without frank pulmonary edema.  Heart size is upper limits of normal.  IMPRESSION: 1.  Allowing for slight differences in patient positioning, the radiographic appearance of the chest is essentially unchanged, as above.   Original Report Authenticated By: Trudie Reed, M.D.     Assessment/Plan: (L)  empyema, s/p CT guided left chest tube thoracostomy-3/6 Continue to follow along with primary teams     LOS: 12 days    Brayton El PA-C 06/15/2012 8:52 AM

## 2012-06-15 NOTE — Progress Notes (Signed)
PT Cancellation Note  Patient Details Name: Cody [name] MRN: [id number] DOB: 12-10-1950   Cancelled Treatment:    Reason Eval/Treat Not Completed:  (pt fast asleep did not awaken easily to voice) Will check back later today  Donnetta Hail 06/15/2012, 10:25 AM

## 2012-06-15 NOTE — Clinical Social Work Note (Signed)
CSW continuing to monitor patient's progress and will facilitate discharge to [facility] skilled nursing facility when medically stable.  Genelle Bal, MSW, LCSW [phone]

## 2012-06-15 NOTE — Progress Notes (Signed)
Utilization review completed.  

## 2012-06-15 NOTE — Progress Notes (Signed)
PT Cancellation Note  Patient Details Name: Cody [name] MRN: [id number] DOB: July 20, 1950   Cancelled Treatment:    Reason Eval/Treat Not Completed: Patient at procedure or test/unavailable (xray)   Donnetta Hail 06/15/2012, 4:51 PM

## 2012-06-15 NOTE — Progress Notes (Signed)
Name: Cody [name]  MRN: [id number]  DOB: [birth year]  ADMISSION DATE: 05/29/2012   CONSULTATION DATE: 2/25 rehab, now admission 2/26  BRIEF PATIENT DESCRIPTION: 62 y/o M with PMH of COPD, GERD, HLD, HTN, CVA admitted 1/5-2/21 with VDRF secondary to influenza A and aspiration PNA. Required trach 04/29/2012. Hospital course complicated by new PE, Bilateral DVT s/p IVC filter 1/27. 2/24 noted to have worsened LLL atx on cxr. 2/25 mild temp (100.4) and worsened cxr. PCCM consulted for concern of LLL PNA.   CULTURES:  2/25 Sputum >> MSSA 2/26 bronch BAL>>> negative Blood 2/27 >> neg 2/28 C diff >> NEG 3/06 Lt pleural fluid >> neg to date  ANTIBIOTICS:  Zosyn 2/25>  vanc 2/26 >>3/1  SIGNIFICANT EVENTS / STUDIES:  1/5-2/21 - Admit to [facility] for [facility] 2/2 flu, asp PNA s/p trach  2/26 retrach, bronch, distress, vent needed 2/27 off full support. ATC 2/28 back on to full support 2/28  Back to ATC 3/03 PEG pulled out, passed for D1, pudding thick liquid  3/04: ct chest >> Large cavitary left lower lung region consistent with empyema, increased ASD in lingula, mediastinal LAN 3/05: Consulted TCTS 3/06: IR placed catheter Lt pleural space >> LDH > 10K, Protein 3.3, WBC 26,800 (79%N)  06/14/2012: Breathing better.  Sore at chest tube site.   SUBJECTIVE/OVERNIGHT/INTERVAL HX  06/15/2012: Complaints of severe pain with inspiration and pleuritic-type pain in the left side where there is chest tu is getting worse over past several days     VITAL SIGNS: Temp:  [98.2 F (36.8 C)-98.4 F (36.9 C)] 98.4 F (36.9 C) (03/10 0948) Pulse Rate:  [88-135] 120 (03/10 0948) Resp:  [16-20] 20 (03/10 0948) BP: (137-145)/(87-97) 139/97 mmHg (03/10 0948) SpO2:  [94 %-98 %] 98 % (03/10 0948) FiO2 (%):  [35 %] 35 % (03/10 0942)  INTAKE / OUTPUT: Intake/Output     03/09 0701 - 03/10 0700 03/10 0701 - 03/11 0700   P.O.  240   Total Intake(mL/kg)  240 (2.9)   Urine (mL/kg/hr)     Stool     Total  Output       Net   +240        Urine Occurrence  1 x   Stool Occurrence  1 x    PHYSICAL EXAMINATION:  General:  Chronically ill in NAD Neuro:  Alert, normal strength HEENT:  Trach clean, weak but improved phonation with PMV Cardiovascular:  s1 s2 RRR Lungs:  Decreased in left  35% ATC Abdomen:  Soft, BS wnl Musculoskeletal:  No edema Skin:  No rash  Recent Labs Lab 06/10/12 0620 06/11/12 0257 06/13/12 0435  NA 140 136 140  K 3.1* 3.4* 3.1*  CL 100 98 102  CO2 34* 32 34*  BUN 10 8 9   CREATININE 0.73 0.69 0.75  GLUCOSE 106* 110* 97    Recent Labs Lab 06/13/12 0435 06/14/12 0510 06/15/12 0519  HGB 10.1* 10.6* 9.8*  HCT 30.6* 33.0* 30.3*  WBC 11.3* 13.0* 15.5*  PLT 353 372 351   Lab Results  Component Value Date   INR 1.22 06/12/2012   INR 1.58* 06/11/2012   INR 1.83* 06/10/2012   Imaging: No results found.  ASSESSMENT / PLAN:  Aspiration pneumonia with empyema. Chronic respiratory failure s/p tracheostomy (#6 cuffless). PE with b/l lower extremity DVT acquired in hospital. Hx of COPD.  06/15/2012: Major issue is pleuritic chest pain pain probably due to friction from chest tube-   P:  Get a stat chest x-ray 06/15/2012  Continue trach collar with goal Spo2 > 92% Continue chest tube  F/u pleural fluid culture results Continue heparin gtt for DVT/PE until interventions for pleural disease completed >> then transition to coumadin Continue Zosyn pending culture results Continue speech valve as tolerated  PRN BD's   Anemia of chronic disease.  Recent Labs Lab 06/13/12 0435 06/14/12 0510 06/15/12 0519  HGB 10.1* 10.6* 9.8*  HCT 30.6* 33.0* 30.3*  WBC 11.3* 13.0* 15.5*  PLT 353 372 351    P:  Packed red cell only for hemoglobin less than 7 g percent   Dysphagia  P: D1 diet >> f/u with speech  Insomnia/anxiety. Improved with change from [place] to trazodone P: -continue trazodone at at bedtime -caution with sedating medications  Hx of  HTN. P: Monitor blood pressure for now  Dispostion >> Will need SNF placement after intervention for empyema completed  Resolved problems >> acute respiratory failure, influenza A pneumonia, diarrhea    Dr. Kalman Shan, M.D., [facility].C.P Pulmonary and Critical Care Medicine Staff Physician [facility] Pulmonary and Critical Care Pager: [phone], If no answer or between  15:00h - 7:00h: call [phone]  06/15/2012 11:51 AM

## 2012-06-16 ENCOUNTER — Encounter (HOSPITAL_COMMUNITY): Payer: Self-pay

## 2012-06-16 ENCOUNTER — Inpatient Hospital Stay (HOSPITAL_COMMUNITY): Payer: Medicaid Other

## 2012-06-16 DIAGNOSIS — J869 Pyothorax without fistula: Secondary | ICD-10-CM

## 2012-06-16 DIAGNOSIS — M7981 Nontraumatic hematoma of soft tissue: Secondary | ICD-10-CM

## 2012-06-16 LAB — CBC
HCT: 30.3 % — ABNORMAL LOW (ref 39.0–52.0)
MCV: 89.1 fL (ref 78.0–100.0)
Platelets: 326 10*3/uL (ref 150–400)
RBC: 3.4 MIL/uL — ABNORMAL LOW (ref 4.22–5.81)
RDW: 15.9 % — ABNORMAL HIGH (ref 11.5–15.5)
WBC: 16.7 10*3/uL — ABNORMAL HIGH (ref 4.0–10.5)

## 2012-06-16 LAB — BASIC METABOLIC PANEL
CO2: 34 mEq/L — ABNORMAL HIGH (ref 19–32)
Chloride: 99 mEq/L (ref 96–112)
GFR calc Af Amer: >90 mL/min (ref >90)
Potassium: 3 mEq/L — ABNORMAL LOW (ref 3.5–5.1)

## 2012-06-16 LAB — CHOLESTEROL, BODY FLUID: Cholesterol, Fluid: 41 mg/dL

## 2012-06-16 LAB — PHOSPHORUS: Phosphorus: 2.9 mg/dL (ref 2.3–4.6)

## 2012-06-16 LAB — HEPARIN LEVEL (UNFRACTIONATED): Heparin Unfractionated: 0.33 IU/mL (ref 0.30–0.70)

## 2012-06-16 MED ORDER — ZOLPIDEM TARTRATE 5 MG PO TABS
5.0000 mg | ORAL_TABLET | Freq: Every evening | ORAL | Status: DC | PRN
  Administered 2012-06-18: 5 mg via ORAL
  Filled 2012-06-16 (×3): qty 1

## 2012-06-16 MED ORDER — POTASSIUM CHLORIDE CRYS ER 20 MEQ PO TBCR
40.0000 meq | EXTENDED_RELEASE_TABLET | Freq: Once | ORAL | Status: AC
  Administered 2012-06-16: 40 meq via ORAL
  Filled 2012-06-16: qty 2

## 2012-06-16 NOTE — Progress Notes (Signed)
eLink Physician-Brief Progress Note Patient Name: DANDRE SISLER DOB: [birth year] MRN: [id number]  Date of Service  06/16/2012   HPI/Events of Note  Insomnia   eICU Interventions  Ambien 5 mg po qhs prn   Intervention Category Minor Interventions: Routine modifications to care plan (e.g. PRN medications for pain, fever)  DETERDING,ELIZABETH 06/16/2012, 11:59 PM

## 2012-06-16 NOTE — Clinical Social Work Note (Addendum)
CSW intern made contact with Admissions Director Marylene Land from [place] to advise her that he will still be coming to her facility but he is not yet medically stable for discharge. Updated FL2 and MD progress note were sent to the facility 06/17/2011.   Fernande Boyden, Social Work Intern 06/17/2011  Genelle Bal, LCSW 06/16/12

## 2012-06-16 NOTE — Progress Notes (Signed)
Procedure(s) (LRB): VIDEO ASSISTED THORACOSCOPY (VATS)/DECORTICATION (Left) EMPYEMA DRAINAGE (Left) Subjective: Small loculated empyema left lung base successfully drained with IR pigtail catheter-no further drainage for the past 48 hours, I've asked IR to remove catheter  New left hematoma in the pectoralis major from complication of IV heparin due to history DVT. CT scan of chest reviewed. The pectoralis hematoma should resolve off anticoagulation and should not need surgery or surgical drainage. Consider caval filter if it is felt risk of PE is high   Objective: Vital signs in last 24 hours: Temp:  [98 F (36.7 C)-98.9 F (37.2 C)] 98 F (36.7 C) (03/11 1400) Pulse Rate:  [83-102] 88 (03/11 1400) Cardiac Rhythm:  [-]  Resp:  [18-20] 18 (03/11 1400) BP: (126-156)/(75-91) 132/78 mmHg (03/11 1400) SpO2:  [93 %-98 %] 96 % (03/11 1400) FiO2 (%):  [35 %] 35 % (03/11 1400) Weight:  [185 lb 4.8 oz (84.052 kg)] 185 lb 4.8 oz (84.052 kg) (03/10 2127)  Hemodynamic parameters for last 24 hours:   stable vitals  Intake/Output from previous day: 03/10 0701 - 03/11 0700 In: 360 [P.O.:360] Out: -  Intake/Output this shift: Total I/O In: 240 [P.O.:240] Out: -   Exam Patient alert and comfortable Large left pectoral hematoma without warmth mildly tender Breath sounds clear bilaterally posteriorly No measurable drainage from pleural pigtail catheter over the past 24 hours  Lab Results:  Recent Labs  06/15/12 0519 06/16/12 0420  WBC 15.5* 16.7*  HGB 9.8* 9.9*  HCT 30.3* 30.3*  PLT 351 326   BMET:  Recent Labs  06/16/12 0420  NA 139  K 3.0*  CL 99  CO2 34*  GLUCOSE 102*  BUN 5*  CREATININE 0.72  CALCIUM 9.0    PT/INR: No results found for this basename: LABPROT, INR,  in the last 72 hours ABG    Component Value Date/Time   PHART 7.485* 06/04/2012 0139   HCO3 37.1* 06/04/2012 0139   TCO2 39 06/04/2012 0139   ACIDBASEDEF 1.0 04/12/2012 2013   O2SAT 96.0 06/04/2012 0139    CBG (last 3)  No results found for this basename: GLUCAP,  in the last 72 hours  Assessment/Plan: S/P Procedure(s) (LRB): VIDEO ASSISTED THORACOSCOPY (VATS)/DECORTICATION (Left) EMPYEMA DRAINAGE (Left) Recommend EC pigtail catheter, continue IV antibiotics total 14 days Recommend observation of pectoralis hematoma and consider caval filter while anticoagulation is held   LOS: 13 days    VAN TRIGT III,PETER 06/16/2012

## 2012-06-16 NOTE — Progress Notes (Addendum)
  Subjective: Pt c/o moderate and worsening left upper chest discomfort with intermittent "burning" sensation across upper/ lateral left chest area.   Objective: Vital signs in last 24 hours: Temp:  [98 F (36.7 C)-98.9 F (37.2 C)] 98.6 F (37 C) (03/11 0555) Pulse Rate:  [83-135] 85 (03/11 0555) Resp:  [18-20] 19 (03/11 0555) BP: (129-156)/(82-97) 129/83 mmHg (03/11 0555) SpO2:  [93 %-99 %] 97 % (03/11 0555) FiO2 (%):  [35 %] 35 % (03/11 0555) Weight:  [185 lb 4.8 oz (84.052 kg)] 185 lb 4.8 oz (84.052 kg) (03/10 2127) Last BM Date: 06/15/12  Intake/Output from previous day: 03/10 0701 - 03/11 0700 In: 360 [P.O.:360] Out: -  Intake/Output this shift:    Left post chest tube intact, all connections secure; about 380 cc's purulent fluid in pleuravac, cx's neg to date, cytology neg for malig cells; pt has marked edema of left upper ant chest region, tender to palpation ? hematoma (not present few days ago; pt on IV heparin) with some extension to postlat chest region. 3/10  CXR with no ptx.  Lab Results:   Recent Labs  06/15/12 0519 06/16/12 0420  WBC 15.5* 16.7*  HGB 9.8* 9.9*  HCT 30.3* 30.3*  PLT 351 326   BMET  Recent Labs  06/16/12 0420  NA 139  K 3.0*  CL 99  CO2 34*  GLUCOSE 102*  BUN 5*  CREATININE 0.72  CALCIUM 9.0   PT/INR No results found for this basename: LABPROT, INR,  in the last 72 hours ABG No results found for this basename: PHART, PCO2, PO2, HCO3,  in the last 72 hours  Studies/Results: Dg Chest 1 View  06/15/2012  *RADIOLOGY REPORT*  Clinical Data: Left chest tube.  Severe chest pain.  CHEST - 1 VIEW  Comparison: 06/13/2012  Findings: The patient has a left-sided chest tube, unchanged in position.  Left pleural effusion is present.  No evidence for pneumothorax.  Perihilar bronchitic changes are present and stable. Tracheostomy tube is unchanged.  IMPRESSION: No significant change in loss of pleural effusion and chest tube.   Original  Report Authenticated By: Norva Pavlov, M.D.     Anti-infectives: Anti-infectives   Start     Dose/Rate Route Frequency Ordered Stop   06/05/12 1600  vancomycin (VANCOCIN) 500 mg in sodium chloride 0.9 % 100 mL IVPB  Status:  Discontinued     500 mg 100 mL/hr over 60 Minutes Intravenous Every 8 hours 06/05/12 1304 06/05/12 1324   06/05/12 1600  vancomycin (VANCOCIN) 750 mg in sodium chloride 0.9 % 150 mL IVPB  Status:  Discontinued     750 mg 150 mL/hr over 60 Minutes Intravenous Every 8 hours 06/05/12 1324 06/06/12 0820   06/03/12 2100  vancomycin (VANCOCIN) IVPB 1000 mg/200 mL premix  Status:  Discontinued     1,000 mg 200 mL/hr over 60 Minutes Intravenous Every 8 hours 06/03/12 1953 06/05/12 1248   06/03/12 2100  piperacillin-tazobactam (ZOSYN) IVPB 3.375 g     3.375 g 12.5 mL/hr over 240 Minutes Intravenous Every 8 hours 06/03/12 1953        Assessment/Plan: S/p left chest empyema drainage 3/6; now with increasing left upper/ant chest discomfort with possible chest wall hematoma and component of SQ air. Recommend stopping IV heparin and obtaining CT chest for further evaluation, notify CCM. Replace K. Other plans as per primary.  LOS: 13 days    ALLRED,D [facility] 06/16/2012

## 2012-06-16 NOTE — Progress Notes (Signed)
Today's events noted. There is still output from the left pigtail catheter. Patient developed marked edema of the left upper anterior chest. CT done showed left anterior hematoma of the pectoralis minor muscle, nearly resolved loculated left empyema although may be some slight increase in it anteriorly, improving mediastinal adenopathy, and ascending thoracic aortic aneurysm of 4.4 cm. He was on heparin and this was stopped. Pigtail to remain for now. Hope to remove when drainage decreases.

## 2012-06-16 NOTE — Progress Notes (Signed)
Patient c/of left upper chest pain 10/10. Upon assessment left upper chest is swollen and tender to the touch. Informed Dr. Marchelle Gearing, per his order Heparin drip was discontinued at 08:50. Other orders given were for Chest CT and to apply ice pack to affected area. Ice applied. Will continue to monitor patient closely.

## 2012-06-16 NOTE — Progress Notes (Signed)
Name: Cody [name]  MRN: [id number]  DOB: 03-03-1951  ADMISSION DATE: 05/29/2012   CONSULTATION DATE: 2/25 rehab, now admission 2/26  BRIEF PATIENT DESCRIPTION: 62 y/o M with PMH of COPD, GERD, HLD, HTN, CVA admitted 1/5-2/21 with VDRF secondary to influenza A and aspiration PNA. Required trach 04/29/2012. Hospital course complicated by new PE, Bilateral DVT s/p IVC filter 1/27. 2/24 noted to have worsened LLL atx on cxr. 2/25 mild temp (100.4) and worsened cxr. PCCM consulted for concern of LLL PNA.   CULTURES:  2/25 Sputum >> MSSA 2/26 bronch BAL>>> negative Blood 2/27 >> neg 2/28 C diff >> NEG 3/06 Lt pleural fluid >> neg to date  ANTIBIOTICS:  Zosyn 2/25>  vanc 2/26 >>3/1  SIGNIFICANT EVENTS / STUDIES:  1/5-2/21 - Admit to [facility] for VDR[facility] 2/2 flu, asp PNA s/p trach  2/26 retrach, bronch, distress, vent needed 2/27 off full support. ATC 2/28 back on to full support 2/28  Back to ATC 3/03 PEG pulled out, passed for D1, pudding thick liquid  3/04: ct chest >> Large cavitary left lower lung region consistent with empyema, increased ASD in lingula, mediastinal LAN 3/05: Consulted TCTS 3/06: IR placed catheter Lt pleural space >> LDH > 10K, Protein 3.3, WBC 26,800 (79%N)  06/14/2012: Breathing better.  Sore at chest tube site.   06/15/2012: Complaints of severe pain with inspiration and pleuritic-type pain in the left side where there is chest tu is getting worse over past several days . CXR unchanged    SUBJECTIVE/OVERNIGHT/INTERVAL HX 06/16/12: continued worsenign pain but now pain left infraclavicular with huge soft tissue swelling that has increased overnight. Patient on IV heparin but no hgb drop    VITAL SIGNS: Temp:  [98 F (36.7 C)-98.9 F (37.2 C)] 98 F (36.7 C) (03/11 0928) Pulse Rate:  [83-102] 102 (03/11 0928) Resp:  [18-20] 18 (03/11 0928) BP: (126-156)/(75-91) 126/75 mmHg (03/11 0928) SpO2:  [93 %-99 %] 98 % (03/11 0928) FiO2 (%):  [35 %] 35 % (03/11  0928) Weight:  [84.052 kg (185 lb 4.8 oz)] 84.052 kg (185 lb 4.8 oz) (03/10 2127)  INTAKE / OUTPUT: Intake/Output     03/10 0701 - 03/11 0700 03/11 0701 - 03/12 0700   P.O. 360 120   Total Intake(mL/kg) 360 (4.3) 120 (1.4)   Net +360 +120        Urine Occurrence 7 x 1 x   Stool Occurrence 1 x 1 x    PHYSICAL EXAMINATION:  General:  He is in pain Neuro:  Alert, normal strength HEENT:  Trach clean, weak but improved phonation with PMV Cardiovascular:  s1 s2 RRR Chest: Soft tissue swelling centimeters is warm and tender but not red, 7cm alteast left infra-clavicle area Lungs:  Decreased in left  35% ATC Abdomen:  Soft, BS wnl Musculoskeletal:  No edema Skin:  No rash  Recent Labs Lab 06/11/12 0257 06/13/12 0435 06/16/12 0420  NA 136 140 139  K 3.4* 3.1* 3.0*  CL 98 102 99  CO2 32 34* 34*  BUN 8 9 5*  CREATININE 0.69 0.75 0.72  GLUCOSE 110* 97 102*    Recent Labs Lab 06/14/12 0510 06/15/12 0519 06/16/12 0420  HGB 10.6* 9.8* 9.9*  HCT 33.0* 30.3* 30.3*  WBC 13.0* 15.5* 16.7*  PLT 372 351 326   Lab Results  Component Value Date   INR 1.22 06/12/2012   INR 1.58* 06/11/2012   INR 1.83* 06/10/2012   Imaging: Dg Chest 1 View  06/15/2012  *  RADIOLOGY REPORT*  Clinical Data: Left chest tube.  Severe chest pain.  CHEST - 1 VIEW  Comparison: 06/13/2012  Findings: The patient has a left-sided chest tube, unchanged in position.  Left pleural effusion is present.  No evidence for pneumothorax.  Perihilar bronchitic changes are present and stable. Tracheostomy tube is unchanged.  IMPRESSION: No significant change in loss of pleural effusion and chest tube.   Original Report Authenticated By: Norva Pavlov, M.D.     ASSESSMENT / PLAN:  Aspiration pneumonia with empyema. Chronic respiratory failure s/p tracheostomy (#6 cuffless). PE with b/l lower extremity DVT acquired in hospital. Hx of COPD.  06/16/12: Soft tissue swelling left infra-clav area to explain pain that has  developed over past few days. ? Hematoma (on IV heparin)   P:   DC iv heparin stat Get CT chest stat CVTS consult depending on CT chest Continue trach collar with goal Spo2 > 92% Continue chest tube  F/u pleural fluid culture results Continue Zosyn pending culture results Continue speech valve as tolerated  PRN BD's   Anemia of chronic disease.  Recent Labs Lab 06/14/12 0510 06/15/12 0519 06/16/12 0420  HGB 10.6* 9.8* 9.9*  HCT 33.0* 30.3* 30.3*  WBC 13.0* 15.5* 16.7*  PLT 372 351 326    P:  Packed red cell only for hemoglobin less than 7 g percent   Dysphagia  P: D1 diet >> f/u with speech  Insomnia/anxiety. Improved with change from [place] to trazodone P: -continue trazodone at at bedtime -caution with sedating medications  Hx of HTN. P: Monitor blood pressure for now  Dispostion >> Will need SNF placement after intervention for empyema completed  Resolved problems >> acute respiratory failure, influenza A pneumonia, diarrhea    Dr. Kalman Shan, M.D., [facility].P Pulmonary and Critical Care Medicine Staff Physician [facility] Pager: [phone], If no answer or between  15:00h - 7:00h: call [phone]  06/16/2012 11:59 AM

## 2012-06-16 NOTE — Progress Notes (Signed)
ANTICOAGULATION & ANTIBIOTIC CONSULT NOTE - Follow Up Consult  Pharmacy Consult for Heparin (currently held) & Zosyn Indication: Hx recent PE/DVT (Jan '14) & Empiric empyema coverage  Allergies  Allergen Reactions  . Protamine Anaphylaxis    Patient Measurements: Height: 5\' 8"  (172.7 cm) Weight: 185 lb 4.8 oz (84.052 kg) IBW/kg (Calculated) : 68.4 Heparin Dosing Weight: 82.6 kg  Vital Signs: Temp: 98.6 F (37 C) (03/11 0555) Temp src: Oral (03/11 0555) BP: 129/83 mmHg (03/11 0555) Pulse Rate: 85 (03/11 0555)  Labs:  Recent Labs  06/14/12 0510 06/15/12 0519 06/16/12 0420  HGB 10.6* 9.8* 9.9*  HCT 33.0* 30.3* 30.3*  PLT 372 351 326  HEPARINUNFRC 0.48 0.32 0.33  CREATININE  --   --  0.72    Estimated Creatinine Clearance: 98.6 ml/min (by C-G formula based on Cr of 0.72).   Assessment: 62 y.o M started on heparin bridge on 3/6 while warfarin on hold for hx recent DVT/PE in Jan '14 -- currently on hold this morning while evaluating for chest wall hematoma due to the patient's increased upper chest pain.  Heparin level this morning is therapeutic (HL 0.33 << 0.32, goal of 0.3-0.7). Warfarin remains on hold per CCM "until interventions for pleural disease completed". Hgb/Hct/Plt stable. Will follow-up anticoag restart plans after Chest CT obtained this morning.  The patient continues on Zosyn for empiric empyema coverage. The patient's chest tube remains in place. Pleural fluid cultures thus far have shown no growth or no growth to date (fungal cx take ~4 weeks).   Goal of Therapy:  Heparin level 0.3-0.7 units/ml Monitor platelets by anticoagulation protocol: Yes Proper antibiotics for infection/cultures adjusted for renal/hepatic function    Plan:  1. Heparin held this morning for evaluation of chest wall hematoma 2. Continue Zosyn 3.375g IV every 8 hours -- please address LOT 3. Will follow-up with anticoagulation plans after STAT Chest CT this morning 4. Will  continue to follow renal function, culture results, LOT, and antibiotic de-escalation plans   Georgina Pillion, PharmD, BCPS Clinical Pharmacist Pager: [phone] 06/16/2012 9:19 AM

## 2012-06-16 NOTE — Progress Notes (Signed)
NUTRITION FOLLOW UP  DOCUMENTATION CODES  Per approved criteria   -Severe malnutrition in the context of acute illness or injury   Intervention:   1. Continue supplements 2. If intake does not improve, strongly recommend initiation of nutrition support in setting of malnutrition and ongoing poor oral intake 3. RD to continue to follow nutrition care plan  Nutrition Dx:   Increased nutrient needs related to PNA, acute illness as evidenced by estimated nutrition needs. Ongoing.   Goal:   Intake to meet >/= 90% of estimated nutrition needs. Unmet.  Monitor:   weight, labs, I/O's, po intake, supplement acceptance  Assessment:   Underwent IR placement of catheter into L pleural space 2/2 worsening empyema on 3/6; also underwent for CT-guided L chest empyema drainage 3/6. Now with increasing left upper/ant chest discomfort with possible chest wall hematoma and component of SQ air. CT showed left anterior hematoma of the pectoralis minor muscle. Continues to have output from his L catheter - planning to remove once drainage decreases.  Planning for SNF placement once intervention for empyema completed.  Remains on Dysphagia 1 diet with Pudding-Thickened Liquids. RN confirms poor oral intake, eating about 25%. Taking Ensure Pudding supplements about half of the time. Ordered for Prostat PO BID as well.  Height: Ht Readings from Last 1 Encounters:  06/08/12 5\' 8"  (1.727 m)    Weight Status:   Wt Readings from Last 1 Encounters:  06/15/12 185 lb 4.8 oz (84.052 kg)  Wt stable since admission, however suspect some weight loss given pt has received +13 L since admission and has maintained weight.  Re-estimated needs:  Kcal: 2000 - 2200 Protein: 125 - 135 grams Fluid: 2 - 2.2  Skin: Stage I pressure ulcer to sacrum; abdominal incision  Diet Order: Dysphagia 1; pudding thick   Intake/Output Summary (Last 24 hours) at 06/16/12 1500 Last data filed at 06/16/12 0900  Gross per 24 hour   Intake    120 ml  Output      0 ml  Net    120 ml    Last BM: 3/10   Labs:   Recent Labs Lab 06/11/12 0257 06/13/12 0435 06/16/12 0420  NA 136 140 139  K 3.4* 3.1* 3.0*  CL 98 102 99  CO2 32 34* 34*  BUN 8 9 5*  CREATININE 0.69 0.75 0.72  CALCIUM 8.5 8.6 9.0  MG  --   --  1.8  PHOS  --   --  2.9  GLUCOSE 110* 97 102*    CBG (last 3)  No results found for this basename: GLUCAP,  in the last 72 hours  Scheduled Meds: . antiseptic oral rinse  1 application Mouth Rinse QID  . chlorhexidine  15 mL Mouth/Throat BID  . feeding supplement  1 Container Oral TID BM  . feeding supplement  30 mL Oral BID  . multivitamin with minerals  1 tablet Oral Daily  . piperacillin-tazobactam (ZOSYN)  IV  3.375 g Intravenous Q8H    Continuous Infusions: . dextrose 40 mL/hr at 06/16/12 0423    Jarold Motto MS, RD, LDN Pager: [phone] After-hours pager: [phone]

## 2012-06-16 NOTE — Progress Notes (Signed)
PT Cancellation Note  Patient Details Name: Cody [name] MRN: [id number] DOB: 05-11-1950   Cancelled Treatment:    Reason Eval/Treat Not Completed: Pain limiting ability to participate  Events noted.Pt to go for CT scan Pt c/o pain at chest wall and declines PT.   Bayard Hugger. [place], [place] [phone]  06/16/2012, 10:06 AM

## 2012-06-17 ENCOUNTER — Inpatient Hospital Stay (HOSPITAL_COMMUNITY): Payer: Medicaid Other

## 2012-06-17 LAB — CBC
Hemoglobin: 9.5 g/dL — ABNORMAL LOW (ref 13.0–17.0)
Platelets: 282 10*3/uL (ref 150–400)
RBC: 3.29 MIL/uL — ABNORMAL LOW (ref 4.22–5.81)
WBC: 14.6 10*3/uL — ABNORMAL HIGH (ref 4.0–10.5)

## 2012-06-17 MED ORDER — MAGNESIUM SULFATE IN D5W 10-5 MG/ML-% IV SOLN
1.0000 g | Freq: Once | INTRAVENOUS | Status: AC
  Administered 2012-06-17: 1 g via INTRAVENOUS
  Filled 2012-06-17: qty 100

## 2012-06-17 MED ORDER — ASPIRIN 81 MG PO CHEW
81.0000 mg | CHEWABLE_TABLET | Freq: Every day | ORAL | Status: DC
  Administered 2012-06-17 – 2012-06-18 (×2): 81 mg via ORAL
  Filled 2012-06-17 (×2): qty 1

## 2012-06-17 MED ORDER — MAGNESIUM SULFATE 50 % IJ SOLN: 1.0000 g | Freq: Once | INTRAVENOUS | Status: DC

## 2012-06-17 NOTE — Progress Notes (Signed)
Patient ID: [name], male   DOB: 07-24-1950, 62 y.o.   MRN: [id number]   Removal of left chest tube per Dr Alla German order Removal was without event.  vaseline gauze bandage applied  cxr pending

## 2012-06-17 NOTE — Progress Notes (Signed)
Name: Cody [name]  MRN: [id number]  DOB: 08/11/1950  ADMISSION DATE: 05/29/2012   CONSULTATION DATE: 2/25 rehab, now admission 2/26  BRIEF PATIENT DESCRIPTION: 62 y/o M with PMH of COPD, GERD, HLD, HTN, CVA admitted 1/5-2/21 with VDRF secondary to influenza A and aspiration PNA. Required trach 04/29/2012. Hospital course complicated by new PE, Bilateral DVT s/p IVC filter 1/27. 2/24 noted to have worsened LLL atx on cxr. 2/25 mild temp (100.4) and worsened cxr. PCCM consulted for concern of LLL PNA.   CULTURES:  2/25 Sputum >> MSSA 2/26 bronch BAL>>> negative Blood 2/27 >> neg 2/28 C diff >> NEG 3/06 Lt pleural fluid >> neg to date  ANTIBIOTICS:  Zosyn 2/25>  vanc 2/26 >>3/1  SIGNIFICANT EVENTS / STUDIES:  1/5-2/21 - Admit to [facility] for [facility] 2/2 flu, asp PNA s/p trach   [facility] course complicated by new PE, Bilateral DVT s/p IVC filter 1/27.the [facility] retrievable filter   2/26 retrach, bronch, distress, vent needed 2/27 off full support. ATC 2/28 back on to full support 2/28  Back to ATC 3/03 PEG pulled out, passed for D1, pudding thick liquid  3/04: ct chest >> Large cavitary left lower lung region consistent with empyema, increased ASD in lingula, mediastinal LAN 3/05: Consulted TCTS 3/06: IR placed catheter Lt pleural space >> LDH > 10K, Protein 3.3, WBC 26,800 (79%N)  06/14/2012: Breathing better.  Sore at chest tube site.   06/15/2012: Complaints of severe pain with inspiration and pleuritic-type pain in the left side where there is chest tu is getting worse over past several days . CXR unchanged  06/16/12: left pectoral hematoma due to IV heparin. No hgb drop. Conservative mgmt per CVTS. IV heparin stopped    SUBJECTIVE/OVERNIGHT/INTERVAL HX 06/17/12: Much better left chest wall hematoma after ice and dc heparin. Chest tube out today. No new complaints   VITAL SIGNS: Temp:  [98 F (36.7 C)-98.7 F (37.1 C)] 98.1 F (36.7 C) (03/12 1004) Pulse Rate:   [74-101] 83 (03/12 1139) Resp:  [17-20] 20 (03/12 1139) BP: (130-136)/(75-84) 135/81 mmHg (03/12 1004) SpO2:  [93 %-98 %] 96 % (03/12 1139) FiO2 (%):  [35 %] 35 % (03/12 1139) Weight:  [84.052 kg (185 lb 4.8 oz)] 84.052 kg (185 lb 4.8 oz) (03/11 2124)  INTAKE / OUTPUT: Intake/Output     03/11 0701 - 03/12 0700 03/12 0701 - 03/13 0700   P.O.     Total Intake(mL/kg)     Net            Urine Occurrence 7 x    Stool Occurrence 2 x     PHYSICAL EXAMINATION:  General: HE looks better Neuro:  Alert, normal strength HEENT:  Trach clean, weak but improved phonation with PMV Cardiovascular:  s1 s2 RRR Chest: Soft tissue swelling centimeters is warm and tender but not red, 7cm alteast left infra-clavicle area: improved somewhat and less tender for sure Lungs:  Decreased in left  35% ATC Abdomen:  Soft, BS wnl Musculoskeletal:  No edema Skin:  No rash  Recent Labs Lab 06/11/12 0257 06/13/12 0435 06/16/12 0420  NA 136 140 139  K 3.4* 3.1* 3.0*  CL 98 102 99  CO2 32 34* 34*  BUN 8 9 5*  CREATININE 0.69 0.75 0.72  GLUCOSE 110* 97 102*    Recent Labs Lab 06/15/12 0519 06/16/12 0420 06/17/12 0534  HGB 9.8* 9.9* 9.5*  HCT 30.3* 30.3* 28.8*  WBC 15.5* 16.7* 14.6*  PLT 351 326  282   Lab Results  Component Value Date   INR 1.22 06/12/2012   INR 1.58* 06/11/2012   INR 1.83* 06/10/2012   Imaging: Dg Chest 1 View  06/15/2012  *RADIOLOGY REPORT*  Clinical Data: Left chest tube.  Severe chest pain.  CHEST - 1 VIEW  Comparison: 06/13/2012  Findings: The patient has a left-sided chest tube, unchanged in position.  Left pleural effusion is present.  No evidence for pneumothorax.  Perihilar bronchitic changes are present and stable. Tracheostomy tube is unchanged.  IMPRESSION: No significant change in loss of pleural effusion and chest tube.   Original Report Authenticated By: Norva Pavlov, M.D.    Ct Chest Wo Contrast  06/16/2012  *RADIOLOGY REPORT*  Clinical Data: Acute pain and  swelling, left chest on IV heparin. Evaluate for hematoma  CT CHEST WITHOUT CONTRAST  Technique:  Multidetector CT imaging of the chest was performed following the standard protocol without IV contrast.  Comparison: Most recent prior chest CT 06/09/2012; CT guided chest tube placement 06/11/2012  Findings:  Mediastinum: Unremarkable CT appearance of the thyroid gland. Improving mediastinal adenopathy.  The previously measured index precarinal node measures 14 mm on today's study compared to 17 mm on the prior study.  A subcarinal node measures 11 mm in short axis compared to 16 mm previously.  No soft tissue mediastinal mass. The thoracic esophagus is unremarkable. Tracheostomy tube is in good position.  Heart/Vascular: Limited evaluation in the absence of intravenous contrast material.  The heart is within normal limits for size. Trace pericardial fluid versus thickening anteriorly. Atherosclerotic calcifications noted in the left anterior descending, circumflex and right coronary arteries.  The ascending thoracic aorta is ectatic bordering on aneurysmally dilated at 4.4 cm.  Conventional three-vessel arch anatomy.  Lungs/Pleura: The percutaneous pigtail thoracostomy tube identified in the posterior left pleural space.  There is been a significant interval reduction in the loculated empyema.  There is some moderate loculated pleural fluid anteriorly and laterally which has increased since the prior study. There is associated dependent atelectasis in the left upper and lower lungs.  Mild bronchial wall thickening worse on the left than the right.  Upper Abdomen: Cholelithiasis.  Incompletely imaged IVC filter. Unenhanced CT was performed per clinician order.  Lack of IV contrast limits sensitivity and specificity, especially for evaluation of abdominal/pelvic solid viscera. Low attenuation renal lesions are incompletely imaged and incompletely evaluated the absence of intravenous contrast material.  Statistically,  these are favored to represent benign cysts.  Nodular hyperplasia of the left adrenal gland.  Bones/Soft Tissues: Interval development of a large 9.1 by 9.9 x 5.4 cm heterogeneous high and low attenuation collection in the left subpectoral region centered in the pectoralis minor muscle. There is layering of high attenuation material most consistent with adequate effect.  This appears to represent an acute hematoma. There is associated thickening of the overlying left pectoralis muscle and reticulation and stranding of the subcutaneous skin and fat.  Laxity of the anterior abdominal wall. No acute fracture or aggressive appearing lytic or blastic osseous lesion.  IMPRESSION:  1.  Interval development of an acute 9.9 x 9.1 x 5.4 cm left anterior chest wall hematoma centered within the pectoralis minor muscle.  2.  Nearly resolved loculated empyema posteriorly within the left pleural space since placement of the percutaneous pigtail drainage catheter.  However, there has been some slight increase in the volume of loculated fluid anterolaterally.  3.  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  4.  Atherosclerosis including coronary artery disease.  5.  Improving mediastinal adenopathy.  6.  Additional ancillary findings as above without significant interval change.  These results will be called to the ordering clinician or representative by the Radiologist Assistant, and communication documented in the PACS Dashboard.   Original Report Authenticated By: Malachy Moan, M.D.    Dg Chest Port 1 View  06/17/2012  *RADIOLOGY REPORT*  Clinical Data: Status post chest tube removal.  PORTABLE CHEST - 1 VIEW  Comparison: Plain film of the chest 06/15/2012 and CT chest 06/16/2012.  Findings: Pigtail catheter has been removed from the left hemithorax.  Left effusion has increased.  No pneumothorax identified.  Left basilar airspace disease is again seen.  Right lung is clear.  Heart  size is normal.  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  IMPRESSION:  Status post left chest tube removal without evidence of left pneumothorax.  Left pleural effusion has worsened.   Original Report Authenticated By: Holley Dexter, M.D.     ASSESSMENT / PLAN:  Aspiration pneumonia with empyema.  - s/p chest tube dc 06/17/12 Chronic respiratory failure s/p tracheostomy (#6 cuffless). PE with b/l lower extremity DVT acquired in hospital. - s/p IVC Filter 05/04/12  - Left pectoral hematoma on IV heparin 06/16/12 Hx of COPD.  3/12: Off chest tube. Hematoma improving  P:   Monitor off chest tube STart aspirin for VTE prophylasix; at some point consider xarelto if no contraindication Continue IVC fitler   Anemia of chronic disease.  Recent Labs Lab 06/15/12 0519 06/16/12 0420 06/17/12 0534  HGB 9.8* 9.9* 9.5*  HCT 30.3* 30.3* 28.8*  WBC 15.5* 16.7* 14.6*  PLT 351 326 282    P:  Packed red cell only for hemoglobin less than 7 g percent   Dysphagia  P: D1 diet >> f/u with speech  Insomnia/anxiety. Improved with change from [place] to trazodone P: -continue trazodone at at bedtime -caution with sedating medications  Hx of HTN. P: Monitor blood pressure for now  Dispostion >> Will need SNF placement after intervention for empyema completed  RENAL  Recent Labs Lab 06/11/12 0257 06/13/12 0435 06/16/12 0420  NA 136 140 139  K 3.4* 3.1* 3.0*  CL 98 102 99  CO2 32 34* 34*  GLUCOSE 110* 97 102*  BUN 8 9 5*  CREATININE 0.69 0.75 0.72  CALCIUM 8.5 8.6 9.0  MG  --   --  1.8  PHOS  --   --  2.9   Mild hypomag  Plan  - replete mag   GLOBAL  ? Dc tp snf rehab soon    Dr. Kalman Shan, M.D., [facility].C.P Pulmonary and Critical Care Medicine Staff Physician [facility] [place] [facility] Pager: [phone], If no answer or between  15:00h - 7:00h: call [phone]  06/17/2012 12:22  PM

## 2012-06-18 DIAGNOSIS — J869 Pyothorax without fistula: Secondary | ICD-10-CM

## 2012-06-18 DIAGNOSIS — M7981 Nontraumatic hematoma of soft tissue: Secondary | ICD-10-CM

## 2012-06-18 LAB — BASIC METABOLIC PANEL
BUN: 6 mg/dL (ref 6–23)
CO2: 33 mEq/L — ABNORMAL HIGH (ref 19–32)
Chloride: 100 mEq/L (ref 96–112)
GFR calc Af Amer: >90 mL/min (ref >90)
Glucose, Bld: 96 mg/dL (ref 70–99)
Potassium: 3.1 mEq/L — ABNORMAL LOW (ref 3.5–5.1)

## 2012-06-18 LAB — CBC
HCT: 28.7 % — ABNORMAL LOW (ref 39.0–52.0)
Hemoglobin: 9.3 g/dL — ABNORMAL LOW (ref 13.0–17.0)
MCV: 89.4 fL (ref 78.0–100.0)
RBC: 3.21 MIL/uL — ABNORMAL LOW (ref 4.22–5.81)
WBC: 13.6 10*3/uL — ABNORMAL HIGH (ref 4.0–10.5)

## 2012-06-18 MED ORDER — TRAZODONE HCL 50 MG PO TABS: 50.0000 mg | ORAL_TABLET | Freq: Every evening | ORAL | Status: DC | PRN

## 2012-06-18 MED ORDER — ADULT MULTIVITAMIN W/MINERALS CH: 1.0000 | ORAL_TABLET | Freq: Every day | ORAL | Status: DC

## 2012-06-18 MED ORDER — LEVOFLOXACIN 750 MG PO TABS: 750.0000 mg | ORAL_TABLET | Freq: Every day | ORAL | Status: DC

## 2012-06-18 MED ORDER — OXYCODONE HCL 5 MG PO TABS: 5.0000 mg | ORAL_TABLET | Freq: Four times a day (QID) | ORAL | Status: DC | PRN

## 2012-06-18 MED ORDER — ASPIRIN 81 MG PO CHEW: 81.0000 mg | CHEWABLE_TABLET | Freq: Every day | ORAL | Status: DC

## 2012-06-18 MED ORDER — IPRATROPIUM-ALBUTEROL 0.5-2.5 (3) MG/3ML IN SOLN: 3.0000 mL | Freq: Four times a day (QID) | RESPIRATORY_TRACT | Status: DC

## 2012-06-18 MED ORDER — ENSURE PUDDING PO PUDG: 1.0000 | Freq: Three times a day (TID) | ORAL | Status: DC

## 2012-06-18 NOTE — Discharge Summary (Addendum)
Physician Discharge Summary     Patient ID: Cody [name] MRN: [id number] DOB/AGE: [birth year] 64 y.o.  Admit date: 06/03/2012 Discharge date: 06/18/2012  Admission Diagnoses: Acute respiratory failure  Discharge Diagnoses:  Active Problems:   HYPERTENSION   COPD (chronic obstructive pulmonary disease)   PE (pulmonary embolism)   Tracheostomy status   Physical deconditioning   HCAP (healthcare-associated pneumonia)   Empyema, left   Hematoma, nontraumatic, soft tissue   Significant Hospital tests/ studies/ interventions and procedures  CULTURES:  2/25 Sputum >> MSSA  2/26 bronch BAL>>> negative  Blood 2/27 >> neg  2/28 C diff >> NEG  3/06 Lt pleural fluid >> neg to date   ANTIBIOTICS:  Zosyn 2/25>  vanc 2/26 >>3/1   1/5-2/21 - Admit to [facility] for VDRF 2/2 flu, asp PNA s/p trach  [facility] course complicated by new PE, Bilateral DVT s/p IVC filter 1/27.the [facility] retrievable filter  2/26 retrach, bronch, distress, vent needed  2/27 off full support. ATC  2/28 back on to full support  2/28 Back to ATC  3/03 PEG pulled out, passed for D1, pudding thick liquid  3/04: ct chest >> Large cavitary left lower lung region consistent with empyema, increased ASD in lingula, mediastinal LAN  3/05: Consulted TCTS  3/06: IR placed catheter Lt pleural space >> LDH > 10K, Protein 3.3, WBC 26,800 (79%N) 3/12: CT chest showed decreased size of loculated left pleural effusion, w/ some residual lateral loculation. pigtail cath removed.    BRIEF PATIENT DESCRIPTION: 62 y/o M with PMH of COPD, GERD, HLD, HTN, CVA admitted 1/5-2/21 with VDRF secondary to influenza A and aspiration PNA. Required trach 1/22. Hospital course complicated by new PE, Bilateral DVT s/p IVC filter 1/27. 2/24 noted to have worsened LLL atx on cxr. 2/25 mild temp (100.4) and worsened cxr. PCCM consulted for concern of LLL PNA. retrach and bronch, need vent  Hospital Course:  Resolved Acute on chronic  respiratory failure Aspiration pneumonia with empyema.  - s/p chest tube dc 06/17/12  Chronic respiratory failure s/p tracheostomy (#6 cuffless).  PE with b/l lower extremity DVT acquired in hospital. - s/p IVC Filter 05/04/12  - Left pectoral hematoma on IV heparin 06/16/12  Hx of COPD.  Admitted to the ICU as direct admit from rehab medicine unit on 2/25 where he had been rehabilitating from an prolonged hospital stay (from 1/5-2/21) for acute respiratory failure in setting of influenza A pneumonia, which required mechanical ventilation and eventually trach to facilitate weaning on 1/22. His course during that stay was complicated by pulmonary emboli and DVT which he received an IVC filter on 1/27. While in rehab he began to develop increased fevers, lethargy and worsening LLL atelectasis. He was transferred back to in-patient setting and placed on mechanical vent and underwent bronchoscopy in effort to recruit lung. He was placed on empiric antibiotics (see above), cultures were sent which eventually grew MSSA. We were able to get him off from the vent on 2/28. We continued to work on rehabilitation focus but during this time his chest films continued to suggest pleural effusion on the left. Because of this we performed a bedside ultrasound this indeed showed loculated fluid, so at this point we had a CT chest completed. This showed loculated fluid collection worrisome for empyema. We therefore asked thoracic surgery to evaluate him. Under their recommendations he underwent CT guided Pig tail catheter placement to drain the pleural fluid collection. This remained until 3/12 at which time follow up  CT chest demonstrated decrease in fluid size. While he was being treated for the above we continued IV heparin for his know PE/DVT. He unfortunately developed an acute left pectoral spontaneous hematoma forcing [place] to stop further anticoagulation. Again he had IVC placed on prior admit. To date he has been on zosyn  from 2/25 to 3/13. We will transition him to levaquin for 5 more days (after CT removal).  At time of discharge he is now ATC 35% oxygen. Doing well with #6 cuffless trach which was last changed on 06/09/10.  P:  Continue levaquin for 5 more days aspirin for VTE prophylasix; at some point consider xarelto if no contraindication; does still have IVC filter.  Will cont ATC and attempt to wean FIO2. We will see him in the trach clinic after his pulmonary follow up. Based on his evaluation at pulmonary follow-up we can either change out his trach or consider decannulation at that time.   Anemia of chronic disease. No further evidence of bleeding off from heparin gtt. Had developed the above hematoma mentioned while on anticoagulation.  P Will need f/u CBC in 7-10d  Dysphagia  Cody [name] is making excellent progress toward his PMSV and dysphagia goals. Pt. demonstrated donning and doffing valve independently using mirror on bedside table without cues. Vocal intensity mildly decreased with min verbal cues for deep inhalation prior to verbalizing. Observed pt. consume majority of vanilla Ensure pudding. He recalled chin tuck position and performed with min verbal cues to position chin slightly further down toward chest. No cough, throat clear or wet vocal quality present. Continue Dys 1, no liquids with PMSV donned with all meals and full supervision (although he may be able to move to intermittent). ST will follow for dysphagia and PMSV treatment.  P:  D1 diet (puree)  Need speech follow up at SNF  Insomnia/anxiety.  Improved with change from [place] to trazodone  P:  -continue trazodone at at bedtime  -caution with sedating medications  Physical deconditioning  P: Ready for SNF rehab.  Discharge Exam: BP 133/80  Pulse 80  Temp(Src) 98.1 F (36.7 C) (Oral)  Resp 18  Ht 5\' 8"  (1.727 m)  Wt 84.7 kg (186 lb 11.7 oz)  BMI 28.4 kg/m2  SpO2 98%  General: HE looks better  Neuro: Alert, normal  strength  HEENT: Trach clean, weak but improved phonation with PMV  Cardiovascular: s1 s2 RRR  Chest: Soft tissue swelling centimeters is warm and tender but not red, 7cm alteast left infra-clavicle area: improved somewhat and less tender for sure  Lungs: Decreased in left 35% ATC  Abdomen: Soft, BS wnl  Musculoskeletal: No edema  Skin: No rash   Labs at discharge Lab Results  Component Value Date   CREATININE 0.69 06/18/2012   BUN 6 06/18/2012   NA 140 06/18/2012   K 3.1* 06/18/2012   CL 100 06/18/2012   CO2 33* 06/18/2012   Lab Results  Component Value Date   WBC 13.6* 06/18/2012   HGB 9.3* 06/18/2012   HCT 28.7* 06/18/2012   MCV 89.4 06/18/2012   PLT 297 06/18/2012   Lab Results  Component Value Date   ALT 27 06/01/2012   AST 17 06/01/2012   ALKPHOS 99 06/01/2012   BILITOT 0.3 06/01/2012   Lab Results  Component Value Date   INR 1.22 06/12/2012   INR 1.58* 06/11/2012   INR 1.83* 06/10/2012    Current radiology studies Dg Chest Port 1 View  06/17/2012  *RADIOLOGY REPORT*  Clinical Data: Status post chest tube removal.  PORTABLE CHEST - 1 VIEW  Comparison: Plain film of the chest 06/15/2012 and CT chest 06/16/2012.  Findings: Pigtail catheter has been removed from the left hemithorax.  Left effusion has increased.  No pneumothorax identified.  Left basilar airspace disease is again seen.  Right lung is clear.  Heart size is normal.  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  IMPRESSION:  Status post left chest tube removal without evidence of left pneumothorax.  Left pleural effusion has worsened.   Original Report Authenticated By: Holley Dexter, M.D.     Disposition:  [facility]      Discharge Orders   Future Appointments Provider Department Dept Phone   07/16/2012 2:45 PM Kalman Shan, MD [facility] [phone]   07/30/2012 1:00 PM [facility] [phone]     Future Orders Complete By Expires     Diet - low sodium heart healthy  As directed     Discharge wound care:  As directed     Comments:      Daily trach care Change inner cannula daily Routine trach care    For home use only DME oxygen  As directed     Comments:      35 % humidified trach collar    Questions:      Mode or (Route):  Mask    Liters per Minute:      Frequency:      Oxygen conserving device:      Increase activity slowly  As directed     Scheduling Instructions:      Needs PT and OT Continue PMV daily    Leave dressing on - Keep it clean, dry, and intact until clinic visit  As directed         Medication List    STOP taking these medications       amLODipine 5 MG tablet  Commonly known as:  NORVASC     hydrochlorothiazide 25 MG tablet  Commonly known as:  HYDRODIURIL     lisinopril 40 MG tablet  Commonly known as:  PRINIVIL,ZESTRIL      TAKE these medications       aspirin 81 MG chewable tablet  Chew 1 tablet (81 mg total) by mouth daily.     feeding supplement Pudg  Take 1 Container by mouth 3 (three) times daily between meals.     ipratropium-albuterol 0.5-2.5 (3) MG/3ML Soln  Commonly known as:  DUONEB  Take 3 mLs by nebulization 4 (four) times daily.     levofloxacin 750 MG tablet  Commonly known as:  LEVAQUIN  Take 1 tablet (750 mg total) by mouth daily.     multivitamin with minerals Tabs  Take 1 tablet by mouth daily.     oxyCODONE 5 MG immediate release tablet  Commonly known as:  Oxy IR/ROXICODONE  Take 1 tablet (5 mg total) by mouth every 6 (six) hours as needed.     traZODone 50 MG tablet  Commonly known as:  DESYREL  Take 1 tablet (50 mg total) by mouth at bedtime as needed for sleep.       Follow-up Information   Follow up with [facility], MD On 07/16/2012. (at 230pm)    Contact information:   [address] [place] [address] [phone]       Schedule an appointment as soon as possible for a visit with  Judie Petit, MD. (after d/c from SNF)    Contact information:   [address] [place] [address] [phone]       Follow up with [facility] On 07/30/2012. (present to admitting at [facility] at 1230 for 1pm appointment at the tracheostomy clinic located in the respiratory care department at [facility].  )    Contact information:   [address] [place] [phone]      Discharged Condition: fair Signed: BABCOCK,PETE 06/18/2012, 1:52 PM    Physician Statement:   The Patient was personally examined, the discharge assessment and plan has been personally reviewed and I agree with ACNP Babcock's assessment and plan. > 30 minutes of time have been dedicated to discharge assessment, planning and discharge instructions.   Dr. Kalman Shan, M.D., [facility].C.P Pulmonary and Critical Care Medicine Staff Physician [facility] Pager: [phone], If no answer or between  15:00h - 7:00h: call [phone]  06/18/2012 1:52 PM

## 2012-06-18 NOTE — Progress Notes (Signed)
Pt transferred to [facility] via EMS.

## 2012-06-18 NOTE — Progress Notes (Signed)
Report called to [facility] @ [phone], Bjorn Loser. Pt will be going to room 127 [facility].

## 2012-06-18 NOTE — Progress Notes (Addendum)
[name]       [name] [address]             [phone]       Procedure(s) (LRB): VIDEO ASSISTED THORACOSCOPY (VATS)/DECORTICATION (Left) EMPYEMA DRAINAGE (Left) Subjective: conts to feel better  Objective    Temp:  [97.7 F (36.5 C)-98.7 F (37.1 C)] 98.4 F (36.9 C) (03/13 0547) Pulse Rate:  [71-97] 73 (03/13 0723) Resp:  [15-20] 18 (03/13 0723) BP: (134-146)/(81-92) 141/83 mmHg (03/13 0547) SpO2:  [94 %-96 %] 95 % (03/13 0723) FiO2 (%):  [35 %] 35 % (03/13 0723) Weight:  [186 lb 11.7 oz (84.7 kg)] 186 lb 11.7 oz (84.7 kg) (03/12 2007)   Intake/Output Summary (Last 24 hours) at 06/18/12 0915 Last data filed at 06/18/12 0548  Gross per 24 hour  Intake      0 ml  Output    450 ml  Net   -450 ml       General appearance: alert, cooperative and no distress Heart: regular rate and rhythm Lungs: dim Left base pec hematoma improving  Lab Results:  Recent Labs  06/16/12 0420 06/18/12 0435  NA 139 140  K 3.0* 3.1*  CL 99 100  CO2 34* 33*  GLUCOSE 102* 96  BUN 5* 6  CREATININE 0.72 0.69  CALCIUM 9.0 9.0  MG 1.8 1.8  PHOS 2.9 2.8   No results found for this basename: AST, ALT, ALKPHOS, BILITOT, PROT, ALBUMIN,  in the last 72 hours No results found for this basename: LIPASE, AMYLASE,  in the last 72 hours  Recent Labs  06/17/12 0534 06/18/12 0435  WBC 14.6* 13.6*  HGB 9.5* 9.3*  HCT 28.8* 28.7*  MCV 87.5 89.4  PLT 282 297   No results found for this basename: CKTOTAL, CKMB, TROPONINI,  in the last 72 hours No components found with this basename: POCBNP,  No results found for this basename: DDIMER,  in the last 72 hours No results found for this basename: HGBA1C,  in the last 72 hours No results found for this basename: CHOL, HDL, LDLCALC, TRIG, CHOLHDL,  in the last 72 hours No results found for this basename: TSH, T4TOTAL, FREET3, T3FREE, THYROIDAB,  in the last 72 hours No results found for this basename: VITAMINB12,  FOLATE, FERRITIN, TIBC, IRON, RETICCTPCT,  in the last 72 hours  Medications: Scheduled . antiseptic oral rinse  1 application Mouth Rinse QID  . aspirin  81 mg Oral Daily  . chlorhexidine  15 mL Mouth/Throat BID  . feeding supplement  1 Container Oral TID BM  . feeding supplement  30 mL Oral BID  . multivitamin with minerals  1 tablet Oral Daily  . piperacillin-tazobactam (ZOSYN)  IV  3.375 g Intravenous Q8H     Radiology/Studies:  Ct Chest Wo Contrast  06/16/2012  *RADIOLOGY REPORT*  Clinical Data: Acute pain and swelling, left chest on IV heparin. Evaluate for hematoma  CT CHEST WITHOUT CONTRAST  Technique:  Multidetector CT imaging of the chest was performed following the standard protocol without IV contrast.  Comparison: Most recent prior chest CT 06/09/2012; CT guided chest tube placement 06/11/2012  Findings:  Mediastinum: Unremarkable CT appearance of the thyroid gland. Improving mediastinal adenopathy.  The previously measured index precarinal node measures 14 mm on today's study compared to 17 mm on the prior study.  A subcarinal node measures 11 mm in short axis compared to 16 mm previously.  No soft tissue mediastinal mass. The thoracic  esophagus is unremarkable. Tracheostomy tube is in good position.  Heart/Vascular: Limited evaluation in the absence of intravenous contrast material.  The heart is within normal limits for size. Trace pericardial fluid versus thickening anteriorly. Atherosclerotic calcifications noted in the left anterior descending, circumflex and right coronary arteries.  The ascending thoracic aorta is ectatic bordering on aneurysmally dilated at 4.4 cm.  Conventional three-vessel arch anatomy.  Lungs/Pleura: The percutaneous pigtail thoracostomy tube identified in the posterior left pleural space.  There is been a significant interval reduction in the loculated empyema.  There is some moderate loculated pleural fluid anteriorly and laterally which has increased since  the prior study. There is associated dependent atelectasis in the left upper and lower lungs.  Mild bronchial wall thickening worse on the left than the right.  Upper Abdomen: Cholelithiasis.  Incompletely imaged IVC filter. Unenhanced CT was performed per clinician order.  Lack of IV contrast limits sensitivity and specificity, especially for evaluation of abdominal/pelvic solid viscera. Low attenuation renal lesions are incompletely imaged and incompletely evaluated the absence of intravenous contrast material.  Statistically, these are favored to represent benign cysts.  Nodular hyperplasia of the left adrenal gland.  Bones/Soft Tissues: Interval development of a large 9.1 by 9.9 x 5.4 cm heterogeneous high and low attenuation collection in the left subpectoral region centered in the pectoralis minor muscle. There is layering of high attenuation material most consistent with adequate effect.  This appears to represent an acute hematoma. There is associated thickening of the overlying left pectoralis muscle and reticulation and stranding of the subcutaneous skin and fat.  Laxity of the anterior abdominal wall. No acute fracture or aggressive appearing lytic or blastic osseous lesion.  IMPRESSION:  1.  Interval development of an acute 9.9 x 9.1 x 5.4 cm left anterior chest wall hematoma centered within the pectoralis minor muscle.  2.  Nearly resolved loculated empyema posteriorly within the left pleural space since placement of the percutaneous pigtail drainage catheter.  However, there has been some slight increase in the volume of loculated fluid anterolaterally.  3.  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  4.  Atherosclerosis including coronary artery disease.  5.  Improving mediastinal adenopathy.  6.  Additional ancillary findings as above without significant interval change.  These results will be called to the ordering clinician or representative by the Radiologist  Assistant, and communication documented in the PACS Dashboard.   Original Report Authenticated By: Malachy Moan, M.D.    Dg Chest Port 1 View  06/17/2012  *RADIOLOGY REPORT*  Clinical Data: Status post chest tube removal.  PORTABLE CHEST - 1 VIEW  Comparison: Plain film of the chest 06/15/2012 and CT chest 06/16/2012.  Findings: Pigtail catheter has been removed from the left hemithorax.  Left effusion has increased.  No pneumothorax identified.  Left basilar airspace disease is again seen.  Right lung is clear.  Heart size is normal.  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  IMPRESSION:  Status post left chest tube removal without evidence of left pneumothorax.  Left pleural effusion has worsened.   Original Report Authenticated By: Holley Dexter, M.D.     INR: Will add last result for INR, ABG once components are confirmed Will add last 4 CBG results once components are confirmed  Assessment/Plan: S/P Procedure(s) (LRB): VIDEO ASSISTED THORACOSCOPY (VATS)/DECORTICATION (Left) EMPYEMA DRAINAGE (Left)  1. Pigtail is out 2 cont management per primary svc 3 will see again at request  LOS: 15 days    [name],Cody [name] 3/13/20149:15 AM  patient examined and medical record reviewed,agree with above note. VAN TRIGT III,PETER 06/18/2012

## 2012-06-19 NOTE — Discharge Summary (Signed)
Dr. Murali Ramaswamy, M.D., F.C.C.P Pulmonary and Critical Care Medicine Staff Physician [facility] [place] Pulmonary and Critical Care Pager: [phone], If no answer or between  15:00h - 7:00h: call [phone]  06/19/2012 5:39 PM    

## 2012-06-25 ENCOUNTER — Non-Acute Institutional Stay (SKILLED_NURSING_FACILITY): Payer: Medicaid Other | Admitting: Adult Health

## 2012-06-25 ENCOUNTER — Encounter: Payer: Self-pay | Admitting: Adult Health

## 2012-06-25 MED ORDER — AMLODIPINE BESYLATE 5 MG PO TABS: 5.0000 mg | ORAL_TABLET | Freq: Every day | ORAL | Status: DC

## 2012-06-25 NOTE — Progress Notes (Signed)
Subjective:     Patient ID: Cody [name], male   DOB: Mar 25, 1951, 62 y.o.   MRN: [id number] Chief Complaint  Patient presents with  . Hypertension   HPI His blood pressure is not being controlled; prior to his hospitalization he had been on lisinopril 40 mg daily hctz 25 mg daily and norvasc 10 mg daily today his b/p is 140/102. He is not voicing any complaints; however; he is concerned that his pressure is no longer under control   Current Outpatient Prescriptions on File Prior to Visit  Medication Sig Dispense Refill  . aspirin 81 MG chewable tablet Chew 1 tablet (81 mg total) by mouth daily.      . feeding supplement (ENSURE) PUDG Take 1 Container by mouth 3 (three) times daily between meals.      Marland Kitchen ipratropium-albuterol (DUONEB) 0.5-2.5 (3) MG/3ML SOLN Take 3 mLs by nebulization 4 (four) times daily.  360 mL    . Multiple Vitamin (MULTIVITAMIN WITH MINERALS) TABS Take 1 tablet by mouth daily.      Marland Kitchen oxyCODONE (OXY IR/ROXICODONE) 5 MG immediate release tablet Take 1 tablet (5 mg total) by mouth every 6 (six) hours as needed.  30 tablet  0  . traZODone (DESYREL) 50 MG tablet Take 1 tablet (50 mg total) by mouth at bedtime as needed for sleep.  30 tablet  0   No current facility-administered medications on file prior to visit.   Past Surgical History  Procedure Laterality Date  . Gastrostomy N/A 05/19/2012    Procedure: PEG Possible Open Gastrostomy;  Surgeon: Kandis Cocking, MD;  Location[facility] OR;  Service: General;  Laterality: N/A;  . Video bronchoscopy Bilateral 06/03/2012    Procedure: VIDEO BRONCHOSCOPY WITHOUT FLUORO;  Surgeon: [name], MD;  Location: [facility] ENDOSCOPY;  Service: Cardiopulmonary;  Laterality: Bilateral;    Review of Systems  Constitutional: Negative.   Eyes: Negative.   Respiratory: Negative.        Has trach  Cardiovascular: Negative.   Gastrointestinal: Negative.   Musculoskeletal: Negative.   Neurological: Negative.   Psychiatric/Behavioral:  Negative.        Filed Vitals:   06/25/12 2230  BP: 140/102  Pulse: 96  Height: 5\' 8"  (1.727 m)  Weight: 183 lb (83.008 kg)   Objective:   Physical Exam  Constitutional: He is oriented to person, place, and time. He appears well-developed and well-nourished.  HENT:  Head: Normocephalic.  Eyes: Conjunctivae are normal. Pupils are equal, round, and reactive to light.  Neck: Normal range of motion. Neck supple.  Cardiovascular: Normal rate, regular rhythm and intact distal pulses.   Pulmonary/Chest: Effort normal and breath sounds normal.  Has trach  Abdominal: Soft. Bowel sounds are normal.  Musculoskeletal: Normal range of motion. He exhibits no edema.  Neurological: He is alert and oriented to person, place, and time.  Skin: Skin is warm and dry.  Psychiatric: He has a normal mood and affect.  lab reviewed:  06-23-12: wbc 11.2; hgb 10.9; hct 33.8; mcv 90; plt 401; glucose 79; bun 8; creat 0.74; k+ 4.2  na++140; t bili 0.5; alk phos 74; ast 17; alt 19; albumin 3.3     Assessment:    hypertension     Plan:     For his worsening  hypertension will begin norvasc 5 mg daily will have nursing check blood pressure every shift and give me a report next week.

## 2012-07-03 ENCOUNTER — Telehealth: Payer: Self-pay | Admitting: Internal Medicine

## 2012-07-03 NOTE — Telephone Encounter (Signed)
Pt has never been seen in the office. MR is not here until 07/06/12. lmtcb x1 for pt

## 2012-07-06 NOTE — Telephone Encounter (Signed)
lmomtcb x 2  

## 2012-07-07 NOTE — Telephone Encounter (Signed)
LMOMTCB x 1 

## 2012-07-08 NOTE — Telephone Encounter (Signed)
Pt advised needs to contact PCP about this medication. Carron Curie, CMA

## 2012-07-09 LAB — FUNGUS CULTURE W SMEAR: Fungal Smear: NONE SEEN

## 2012-07-16 ENCOUNTER — Ambulatory Visit (INDEPENDENT_AMBULATORY_CARE_PROVIDER_SITE_OTHER)
Admission: RE | Admit: 2012-07-16 | Discharge: 2012-07-16 | Disposition: A | Discharge status: Home / Self Care | Payer: Medicaid Other | Source: Ambulatory Visit | Attending: Internal Medicine | Admitting: Internal Medicine

## 2012-07-16 ENCOUNTER — Ambulatory Visit (INDEPENDENT_AMBULATORY_CARE_PROVIDER_SITE_OTHER): Payer: Medicaid Other | Admitting: Internal Medicine

## 2012-07-16 ENCOUNTER — Encounter: Payer: Self-pay | Admitting: Internal Medicine

## 2012-07-16 ENCOUNTER — Other Ambulatory Visit (INDEPENDENT_AMBULATORY_CARE_PROVIDER_SITE_OTHER): Payer: Medicaid Other

## 2012-07-16 VITALS — BP 142/88 | HR 106 | Temp 98.3°F | Ht 70.0 in | Wt 189.8 lb

## 2012-07-16 DIAGNOSIS — J9610 Chronic respiratory failure, unspecified whether with hypoxia or hypercapnia: Secondary | ICD-10-CM

## 2012-07-16 DIAGNOSIS — J449 Chronic obstructive pulmonary disease, unspecified: Secondary | ICD-10-CM

## 2012-07-16 DIAGNOSIS — Z930 Tracheostomy status: Secondary | ICD-10-CM

## 2012-07-16 DIAGNOSIS — R5381 Other malaise: Secondary | ICD-10-CM

## 2012-07-16 DIAGNOSIS — J869 Pyothorax without fistula: Secondary | ICD-10-CM

## 2012-07-16 DIAGNOSIS — M7981 Nontraumatic hematoma of soft tissue: Secondary | ICD-10-CM

## 2012-07-16 DIAGNOSIS — J189 Pneumonia, unspecified organism: Secondary | ICD-10-CM

## 2012-07-16 DIAGNOSIS — I2699 Other pulmonary embolism without acute cor pulmonale: Secondary | ICD-10-CM

## 2012-07-16 DIAGNOSIS — D638 Anemia in other chronic diseases classified elsewhere: Secondary | ICD-10-CM

## 2012-07-16 DIAGNOSIS — J438 Other emphysema: Secondary | ICD-10-CM

## 2012-07-16 DIAGNOSIS — F17200 Nicotine dependence, unspecified, uncomplicated: Secondary | ICD-10-CM

## 2012-07-16 LAB — BASIC METABOLIC PANEL
BUN: 16 mg/dL (ref 6–23)
Calcium: 9.6 mg/dL (ref 8.4–10.5)
Creatinine, Ser: 0.8 mg/dL (ref 0.4–1.5)
GFR: 97.73 mL/min (ref >60.00)
Glucose, Bld: 106 mg/dL — ABNORMAL HIGH (ref 70–99)

## 2012-07-16 LAB — CBC
Hemoglobin: 13.3 g/dL (ref 13.0–17.0)
MCHC: 33.3 g/dL (ref 30.0–36.0)
Platelets: 365 10*3/uL (ref 150.0–400.0)
RDW: 17.8 % — ABNORMAL HIGH (ref 11.5–14.6)
WBC: 13.9 10*3/uL — ABNORMAL HIGH (ref 4.5–10.5)

## 2012-07-16 NOTE — Progress Notes (Signed)
Subjective:    Patient ID: Cody [name], male    DOB: 04/04/1951, 62 y.o.   MRN: [id number]  HPI Problem list  #COPD not otherwise specified #Cerebrovascular accident  #CHronic small bladder #Smoking hx   #Admitted 04/22/2012 through 05/29/2012   - Influenza A, MEtapneumoviris and H. Influenzae related  acute respiratory failure resulting in prolonged mechanical ventilation and status post tracheostomy 04/29/2012   - Course complicated by     - Delirium - resolved    - A Fib - resolved 05/09/12    - Acute Kidnye Injury - resolved    - Enterobacter UTI 05/14/12 - resolved    - Left-sided empyema status post left-sided pigtail catheter    - Pulmonary embolism with bilateraal DVT 05/02/12 and 05/03/12: Rx with IV heparin/coumadin but then needing retrievable IVC filterin addition  05/04/2012  Due to MOBILE DVT    - s/p PEG tube 05/15/12    -  deconditioning with transfer to inpatient pulmonary rehabilitation   #Admitted 06/03/2012 through 06/18/2012  - Acute and chronic critical illness . Methicillin sensitive staphylococcal aureus pneumonia and associated left-sided empyema with cavitary pneumonia as of 06/09/12 CT chest (new finding) and s/p Catheter placement 06/11/12 under CT guidance  - Course complicated by   -  9 cm  chest wall hematoma 06/16/12 and dc of IV heparin    - Removal of PEG tube   - Deconditioning requiring inpatient rehabilitation   - Anemia of critical illness             OV 07/16/2012 Followup following acute and chronic critical illness. Status post tracheostomy.  - He was discharged from the hospital 4 weeks ago and spent 2 weeks in inpatient rehabilitation. He has been home for 2 weeks. He steadily improving. He is able to do some yard work. He has mild exertional dyspnea and mild cough. Dyspnea is only for 1 flight of stairs and is very tolerable. He is gaining weight. He feels his tracheostomy can come up. He's not on any inhalers for COPD/emphysema. He is  having no problems. He ultimately wants to lose weight because he feels is visceral  obesity is increasing post ICU stay. He is open to the idea of outpatient pulmonary rehabilitation. He is not on anticoagulation and has his IVC filter.  - Of note, his chronic small bladder and frequently urinates every 2 hours and this is unchanged    CAT COPD Symptom & Quality of Life Score (GSK trademark) 0 is no burden. 5 is highest burden 07/16/2012   Never Cough -> Cough all the time 3  No phlegm in chest -> Chest is full of phlegm 3  No chest tightness -> Chest feels very tight 0  No dyspnea for 1 flight stairs/hill -> Very dyspneic for 1 flight of stairs 3  No limitations for ADL at home -> Very limited with ADL at home 3  Confident leaving home -> Not at all confident leaving home 0  Sleep soundly -> Do not sleep soundly because of lung condition 3  Lots of Energy -> No energy at all 3  TOTAL Score (max 40)  18     Review of Systems  Constitutional: Negative for fever and unexpected weight change.  HENT: Negative for ear pain, nosebleeds, congestion, sore throat, rhinorrhea, sneezing, trouble swallowing, dental problem, postnasal drip and sinus pressure.   Eyes: Negative for redness and itching.  Respiratory: Negative for cough, chest tightness, shortness of breath and wheezing.  Cardiovascular: Negative for palpitations and leg swelling.  Gastrointestinal: Negative for nausea and vomiting.  Genitourinary: Negative for dysuria.  Musculoskeletal: Negative for joint swelling.  Skin: Negative for rash.  Neurological: Negative for headaches.  Hematological: Does not bruise/bleed easily.  Psychiatric/Behavioral: Negative for dysphoric mood. The patient is not nervous/anxious.        Objective:   Physical Exam  Nursing note and vitals reviewed. Constitutional: He is oriented to person, place, and time. He appears well-developed and well-nourished. No distress.  Looks phenomenally stronger   HENT:  Head: Normocephalic and atraumatic.  Right Ear: External ear normal.  Left Ear: External ear normal.  Mouth/Throat: Oropharynx is clear and moist. No oropharyngeal exudate.  Tracheostomy with Passy-Muir valve. Site looks clean  Eyes: Conjunctivae and EOM are normal. Pupils are equal, round, and reactive to light. Right eye exhibits no discharge. Left eye exhibits no discharge. No scleral icterus.  Neck: Normal range of motion. Neck supple. No JVD present. No tracheal deviation present. No thyromegaly present.  Cardiovascular: Normal rate, regular rhythm and intact distal pulses.  Exam reveals no gallop and no friction rub.   No murmur heard. Pulmonary/Chest: Effort normal. No respiratory distress. He has no wheezes. He has rales. He exhibits no tenderness.  Mild crackles at the base  Abdominal: Soft. Bowel sounds are normal. He exhibits no distension and no mass. There is no tenderness. There is no rebound and no guarding.  Musculoskeletal: Normal range of motion. He exhibits no edema and no tenderness.  Lymphadenopathy:    He has no cervical adenopathy.  Neurological: He is alert and oriented to person, place, and time. He has normal reflexes. No cranial nerve deficit. Coordination normal.  Skin: Skin is warm and dry. No rash noted. He is not diaphoretic. No erythema. No pallor.  Psychiatric: He has a normal mood and affect. His behavior is normal. Judgment and thought content normal.          Assessment & Plan:

## 2012-07-16 NOTE — Patient Instructions (Addendum)
#  Status post tracheostomy  - Followup tracheostomy clinic 07/30/2012. I think clinically that this can be removed - I have made scripts for Velcro strap for tracheostomy  #Pulmonary embolism in chest wall hematoma - Glad this has resolved. Continue IVC filter - removal of IVC filter and rechallenge with xarelto/coumadin to be decided on fu  #Emphysema/chronic respiratory failure - I have referred you to pulmonary rehabilitation at [facility] - After the tracheostomy comes out we will go pulmonary function test to determine need for inhalers  #Empyema/pneumonia - Do chest x-ray today. Based on this we will determine when he will need a CT scan of the chest  #anemia of critical illness  - do blood work today 07/16/2012   #Followup - 6 weeks with full pulmonary function test

## 2012-07-20 ENCOUNTER — Telehealth: Payer: Self-pay | Admitting: Internal Medicine

## 2012-07-20 DIAGNOSIS — Z430 Encounter for attention to tracheostomy: Secondary | ICD-10-CM

## 2012-07-20 NOTE — Telephone Encounter (Signed)
Called, spoke with pt.  Reports he has been working "with leaves" and trach canula is "filthy." States his trach was last cleaned x 2 wks ago.   He doesn't have a trach cleaning kit and would like this ordered.  He uses [facility].  He was last seen by MR on 07/16/2012 and asked to f/u in 6 wks with PFT.  MR is off until Monday, April 21.  Pt would like this ordered prior to this.  Will send to doc of the day.  Dr. Vassie Loll, pls advise if ok to send order for a trach cleaning kit.  Thank you.

## 2012-07-20 NOTE — Telephone Encounter (Signed)
OK for trach supplies

## 2012-07-20 NOTE — Telephone Encounter (Signed)
Order was sent to [facility]. Pt aware.  

## 2012-07-21 ENCOUNTER — Telehealth: Payer: Self-pay | Admitting: Internal Medicine

## 2012-07-21 NOTE — Progress Notes (Signed)
Quick Note:  Called spoke with patient, advised of lab results / recs as stated by MR. Pt verbalized his understanding and denied any questions. ______

## 2012-07-21 NOTE — Telephone Encounter (Signed)
Had called and spoke with patient regarding his 4.10.14 labs and cxr results.   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

## 2012-07-21 NOTE — Progress Notes (Signed)
Quick Note:  Called spoke with patient, advised of cxr results / recs as stated by MR. Pt verbalized his understanding and denied any questions. ______

## 2012-07-23 ENCOUNTER — Telehealth: Payer: Self-pay | Admitting: Internal Medicine

## 2012-07-23 NOTE — Telephone Encounter (Signed)
I spoke with [name] and gave her the trach clinic # to call since pt appt is there. Nothing further was needed

## 2012-07-26 ENCOUNTER — Encounter: Payer: Self-pay | Admitting: Internal Medicine

## 2012-07-26 DIAGNOSIS — D638 Anemia in other chronic diseases classified elsewhere: Secondary | ICD-10-CM | POA: Insufficient documentation

## 2012-07-26 NOTE — Assessment & Plan Note (Signed)
This is resolved on physical examination 07/16/2012. This was related to IV heparin a month ago

## 2012-07-26 NOTE — Assessment & Plan Note (Signed)
Glad his critical illness has caused him to quit smoking. He swears that he will never smoke again

## 2012-07-26 NOTE — Assessment & Plan Note (Signed)
This is rapidly improved. He is now near normal functional status. I will refer him to outpatient pulmonary rehabilitation which he absolutely needs

## 2012-07-26 NOTE — Assessment & Plan Note (Signed)
This is a presumptive diagnosis based on symptoms and long history of smoking and is presenting illness of influenza. After his tracheostomy is removed we will do pulmonary function test. At this point in time he is asymptomatic from a respiratory standpoint so we will only treat him with albuterol as needed

## 2012-07-26 NOTE — Assessment & Plan Note (Signed)
Clinically he is improved. His son pigtail catheter was removed before he got discharged. I will do a chest x-ray today and if it shows continued improvement we will follow clinically with chest x-ray or CT scan

## 2012-07-26 NOTE — Assessment & Plan Note (Signed)
We will continue IVC filter for the moment. He is hesitant to do anticoagulation given the last chest wall hematoma that he had but as he continues to improve physical he we could rechallenge him with Coumadin or xarelto. I will talk to him about this at followup

## 2012-07-26 NOTE — Assessment & Plan Note (Signed)
Recheck CBC to ensure anemia critical illness is resolved

## 2012-07-26 NOTE — Assessment & Plan Note (Signed)
He is doing really well and I think his tracheostomy can be removed. He has followup with the tracheostomy clinic. I will see him after tracheostomy with pulmonary function test

## 2012-07-26 NOTE — Telephone Encounter (Signed)
After his tracheostomy is removed and after his PFT (PFT to be done after trach removal) and he sees me 09/02/12 when I will do walking desat test and another cxr. Meanwhile, he should strt rehab  Can he be patient for this another month  Without work ?  Thanks  MR

## 2012-07-27 ENCOUNTER — Encounter: Payer: Self-pay | Admitting: *Deleted

## 2012-07-27 ENCOUNTER — Telehealth: Payer: Self-pay | Admitting: Internal Medicine

## 2012-07-27 NOTE — Telephone Encounter (Signed)
Spoke with pt and notified of recs per MR Pt verbalized understanding and states nothing further needed

## 2012-07-27 NOTE — Telephone Encounter (Signed)
Patient Information:  Caller Name: [name]  Phone: [phone]  Patient: Cody [name], Cody [name]  Gender: Male  DOB: 20-Feb-1951  Age: 62 Years  PCP: Birdie Sons (Adults only)  Office Follow Up:  Does the office need to follow up with this patient?: No  Instructions For The Office: N/A  RN Note:  Needs refill of BP medication ordered in Rehab; has enough Norvasc 10 mg to last until 07/29/12.  Was hospitalized for pneumonia in January 2014; discharged from rehab approximately 07/02/12.  Seeing pulmonologist every 6 weeks.  Scheduled for trach removal 07/30/12.  Rehab discharge instructions advised to schedule follow up appointment with PCP.  Transferred to scheduler for appointment at 11:15 on 07/28/12 with Dr Kriste Basque.  Has enough medication to last until office visit 07/28/12. Advised new Rx for medication/refill will be handled at time of office visit.  Symptoms  Reason For Call & Symptoms: Called for refill of prescription ordered by MD in rehab.  Admitted to hospital January 2014 for pneumonia.  Discharged from rehab approximately 07/02/12. Lisinopril was stopped by hospitalist in January 2014.  Using generic Norvasc 10 mg; sig: 1 po daily. Has 2 tabs remaining; will run out on 07/29/12.  Rehab MD will not refill  Norvasc. Pharmacy is [facility].  Reviewed Health History In EMR: N/A  Reviewed Medications In EMR: N/A  Reviewed Allergies In EMR: N/A  Reviewed Surgeries / Procedures: N/A  Date of Onset of Symptoms: 07/27/2012  Guideline(s) Used:  No Protocol Available - Information Only  Disposition Per Guideline:   Home Care  Reason For Disposition Reached:   Information only question and nurse able to answer  Advice Given:  N/A  RN Overrode Recommendation:  Patient Already Has Appt, Document Patient  Appointment scheduled by office staff.

## 2012-07-28 ENCOUNTER — Encounter: Payer: Self-pay | Admitting: Family Medicine

## 2012-07-28 ENCOUNTER — Ambulatory Visit (INDEPENDENT_AMBULATORY_CARE_PROVIDER_SITE_OTHER): Payer: Medicaid Other | Admitting: Family Medicine

## 2012-07-28 VITALS — BP 130/84 | HR 112 | Temp 98.1°F | Wt 192.0 lb

## 2012-07-28 DIAGNOSIS — I10 Essential (primary) hypertension: Secondary | ICD-10-CM

## 2012-07-28 DIAGNOSIS — J449 Chronic obstructive pulmonary disease, unspecified: Secondary | ICD-10-CM

## 2012-07-28 DIAGNOSIS — F17200 Nicotine dependence, unspecified, uncomplicated: Secondary | ICD-10-CM

## 2012-07-28 DIAGNOSIS — R5381 Other malaise: Secondary | ICD-10-CM

## 2012-07-28 DIAGNOSIS — J189 Pneumonia, unspecified organism: Secondary | ICD-10-CM

## 2012-07-28 DIAGNOSIS — I2699 Other pulmonary embolism without acute cor pulmonale: Secondary | ICD-10-CM

## 2012-07-28 MED ORDER — AMLODIPINE BESYLATE 10 MG PO TABS: 10.0000 mg | ORAL_TABLET | Freq: Every day | ORAL | Status: DC

## 2012-07-28 MED ORDER — HYDROCHLOROTHIAZIDE 12.5 MG PO TABS: 12.5000 mg | ORAL_TABLET | Freq: Every day | ORAL | Status: DC

## 2012-07-28 NOTE — Progress Notes (Signed)
Chief Complaint  Patient presents with  . post rehab follow up    HPI:  62 yo M pt of Dr. Cato Mulligan here for follow up for BP: -per chart, recently hospitalized in February and March for resp failure, PNA, 2ncondary to influenza and was in critical shape and intubated. His hospital course was complicated by PE and bilat DVT (now with IVC filter), ACI, empyema, chest wall hematoma, anemia, UTI. -he was in rehab for several weeks and is now doing quite well -he is followed closely for the above by pulmonology - reviewed their recent notes --> he will have trach removed soon, will have PFTs to look for underlying COPD, will consider oral anticoagulation, pulm rehab, pulm is monitoring labs -he is here today for BP issues -norvasc 10mg  given in rehab, but now running out of this, also on hctz 12.5 he is taking from his friend - reports BP is good on this regimen but he needs norvasc Rx -was on lisinopril 40mg  in the past, but this was stopped in hospital when had AKI, renal function back to normal on recent labs -denies: CP, SOB, fevers, swelling, palpitations, bleeding -energy and appetite good  ROS: See pertinent positives and negatives per HPI.  Past Medical History  Diagnosis Date  . Stroke   . Hypertension   . Hyperlipidemia   . GERD (gastroesophageal reflux disease)   . COPD (chronic obstructive pulmonary disease)     Family History  Problem Relation Age of Onset  . COPD Mother     History   Social History  . Marital Status: Single    Spouse Name: N/A    Number of Children: N/A  . Years of Education: N/A   Social History Main Topics  . Smoking status: Former Smoker -- 2.00 packs/day for 45 years    Types: Cigarettes    Quit date: 06/06/2012  . Smokeless tobacco: Never Used  . Alcohol Use: No  . Drug Use: No  . Sexually Active: None   Other Topics Concern  . None   Social History Narrative  . None    Current outpatient prescriptions:amLODipine (NORVASC) 10 MG  tablet, Take 1 tablet (10 mg total) by mouth daily., Disp: 90 tablet, Rfl: 0;  aspirin 81 MG chewable tablet, Chew 1 tablet (81 mg total) by mouth daily., Disp: , Rfl: ;  lamoTRIgine (LAMICTAL) 25 MG tablet, Take 50 mg by mouth daily., Disp: , Rfl: ;  hydrochlorothiazide (HYDRODIURIL) 12.5 MG tablet, Take 1 tablet (12.5 mg total) by mouth daily., Disp: 90 tablet, Rfl: 0  EXAM:  Filed Vitals:   07/28/12 1114  BP: 130/84  Pulse: 112  Temp: 98.1 F (36.7 C)    Body mass index is 27.55 kg/(m^2).  GENERAL: vitals reviewed and listed above, alert, oriented, appears well hydrated and in no acute distress  HEENT: atraumatic, conjunttiva clear, no obvious abnormalities on inspection of external nose and ears  NECK: no obvious masses on inspection  LUNGS: clear to auscultation bilaterally, no wheezes, rales or rhonchi, good air movement  CV: HRRR, no peripheral edema  MS: moves all extremities without noticeable abnormality  PSYCH: pleasant and cooperative, no obvious depression or anxiety  ASSESSMENT AND PLAN:  Discussed the following assessment and plan:  Essential hypertension, benign - Plan: amLODipine (NORVASC) 10 MG tablet, hydrochlorothiazide (HYDRODIURIL) 12.5 MG tablet  TOBACCO USE  COPD (chronic obstructive pulmonary disease)  PE (pulmonary embolism)  Physical deconditioning  HCAP (healthcare-associated pneumonia)   -filled the norvasc and hctz whish  he is currently tolerating well and is controlling his BP -reviewed recent notes and labs -congratulated on quitting smoking and supported on this -he also is going to see a dentist about his teeth -getting trach out Thursday and has follow up every 6 weeks for other issues with pulm scheduled, also he is starting pulm rehab -reports 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  Patient Instructions  Follow up with your doctor for routine follow up at next available appointment in 2-3 months     Dejanay Wamboldt, [facility] R.

## 2012-07-28 NOTE — Patient Instructions (Signed)
Follow up with your doctor for routine follow up at next available appointment in 2-3 months

## 2012-07-28 NOTE — Addendum Note (Signed)
Addended by: Azucena Freed on: 07/28/2012 11:38 AM   Modules accepted: Orders

## 2012-07-30 ENCOUNTER — Ambulatory Visit (HOSPITAL_COMMUNITY)
Admit: 2012-07-30 | Discharge: 2012-07-30 | Disposition: A | Discharge status: Home / Self Care | Payer: Medicaid Other | Attending: Pulmonary Disease | Admitting: Pulmonary Disease

## 2012-07-30 DIAGNOSIS — I10 Essential (primary) hypertension: Secondary | ICD-10-CM | POA: Insufficient documentation

## 2012-07-30 DIAGNOSIS — J189 Pneumonia, unspecified organism: Secondary | ICD-10-CM | POA: Insufficient documentation

## 2012-07-30 DIAGNOSIS — J869 Pyothorax without fistula: Secondary | ICD-10-CM | POA: Insufficient documentation

## 2012-07-30 DIAGNOSIS — Z86711 Personal history of pulmonary embolism: Secondary | ICD-10-CM | POA: Insufficient documentation

## 2012-07-30 DIAGNOSIS — T148XXA Other injury of unspecified body region, initial encounter: Secondary | ICD-10-CM | POA: Insufficient documentation

## 2012-07-30 DIAGNOSIS — J4489 Other specified chronic obstructive pulmonary disease: Secondary | ICD-10-CM | POA: Insufficient documentation

## 2012-07-30 DIAGNOSIS — Z430 Encounter for attention to tracheostomy: Secondary | ICD-10-CM | POA: Insufficient documentation

## 2012-07-30 DIAGNOSIS — J449 Chronic obstructive pulmonary disease, unspecified: Secondary | ICD-10-CM | POA: Insufficient documentation

## 2012-07-30 DIAGNOSIS — X58XXXA Exposure to other specified factors, initial encounter: Secondary | ICD-10-CM | POA: Insufficient documentation

## 2012-07-30 NOTE — Progress Notes (Signed)
[facility] CLINIC  Hospitalized 2/26 - 3/13 with following problem list documented at discharge: Active Problems:  Acute respiratory failure  HYPERTENSION  COPD (chronic obstructive pulmonary disease)  PE (pulmonary embolism)  Tracheostomy status  Physical deconditioning  HCAP (healthcare-associated pneumonia)  Empyema, left  Hematoma, nontraumatic, soft tissue  Discharged to rehab and discharge to home apporx 3 wks ago Sees Dr Marchelle Gearing as outpt    SUBJ: No complaints. No distress. Making excellent progress in recovery. Has plans to start pulm rehab Strong cough. No hemoptysis  OBJ: NAD Trach site clean - small flap of mucosa extruding from stoma Chest clear RRR NABS No edema  IMP: S/P trach tube placement for prolonged VDRF Excellent progress with recovery   PLAN:  decannulate today (4/23) F/U in 2 wks in trach clinic to assess site Provided instruction regarding stoma care as follows    STOMA CARE AFTER TRACHEOSTOMY TUBE REMOVAL:  1) Keep stoma site loosely covered with dry gauze and paper tape  2) Until stoma closes, take baths rather than showers and clean site with damp washcloth only 3) The stoma should close within a couple or few days meaning that you will no longer feel or hear breath coming out of stoma site 4) Once stoma closes you may leave uncovered and clean with soap and water   Cody Fischer, MD ; [facility] service Mobile [phone].  After 5:30 PM or weekends, call [phone]

## 2012-07-30 NOTE — Progress Notes (Signed)
Tracheostomy Procedure Note  Cody [name] [id number] February 12, 1951   Procedure:decannulation   Tracheostomy Information  Trach Brand: Shiley Size: 6 cuffless Style: Uncuffed Secured by: Velcro  Evaluation:  ETCO2 positive color change from yellow to purple : NA Vital signs:blood pressure , pulse , respirations  and pulse oximetry  % Patients current condition: stable Complications: No apparent complications Trach site exam: clean, dry Wound care done: dry, clean and 4 x 4 gauze Patient did tolerate procedure well.   Education: Pt. Educated by MD as to cleaning, and care of wound  Additional needs: Pt. To return to clinic on May 6th at 1230 just for follow up.

## 2012-08-11 ENCOUNTER — Ambulatory Visit (HOSPITAL_COMMUNITY)
Admission: RE | Admit: 2012-08-11 | Discharge: 2012-08-11 | Disposition: A | Discharge status: Home / Self Care | Payer: Medicaid Other | Source: Ambulatory Visit | Attending: Acute Care | Admitting: Acute Care

## 2012-08-11 DIAGNOSIS — Z430 Encounter for attention to tracheostomy: Secondary | ICD-10-CM | POA: Insufficient documentation

## 2012-08-11 NOTE — Progress Notes (Signed)
Tracheostomy Procedure Note  Cody [name] [id number] 1950/12/19   Procedure: Stoma closure exam    Evaluation:  Vital signs:blood pressure 134/102, pulse 98, respirations 19and pulse oximetry 94 % Patients current condition: stable Complications: No apparent complications Trach site exam: healed  Additional needs: PT instructed to follow up w Dr Marchelle Gearing as outpatient. No return to trach clinic.

## 2012-08-20 ENCOUNTER — Telehealth: Payer: Self-pay | Admitting: Internal Medicine

## 2012-08-20 DIAGNOSIS — J869 Pyothorax without fistula: Secondary | ICD-10-CM

## 2012-08-20 NOTE — Telephone Encounter (Signed)
I spoke with Huntley Dec and she states that she saw the pt on Monday and he was having a copd exacerbation so she did a CXR and wanted MR to compare these results to his previous cxr. I advised MR is out of town and she states this is fine, no hurry, she just wants to have MR compare cxr. She is seeing the pt today for f/u and states she will call if pt needs a soonerappt with [place]. Pt currently is scheduled to see MR on 09-02-12. I have plaecd CXR report in MR look-at to review on his return. Please advise. Carron Curie, CMA

## 2012-08-23 NOTE — Telephone Encounter (Signed)
Just order a ct chest without contrast for first week iof June 2014 which will be around 2 months since his fu visit with me. Will be a good time point to track his pneumonia changes from admission march 2014. That is what is needed   Give him fu. I have ordered CT   Dr. Kalman Shan, M.D., [facility].C.P Pulmonary and Critical Care Medicine Staff Physician [facility] [place] [facility] Pager: [phone], If no answer or between  15:00h - 7:00h: call [phone]  08/23/2012 10:21 PM

## 2012-08-25 NOTE — Telephone Encounter (Signed)
Spoke to pt dr Marchelle Gearing wants pt to do ct on 09/15/12 and flu up with him afterwards Cody [name]

## 2012-08-25 NOTE — Telephone Encounter (Signed)
Spoke with Georgette Shell, PA and informed of Dr Jane Canary recommendations.  [facility] is working on getting Chest ct precert.  Will need to resched ov and pft with MR for after chest ct date in order to review all tests at ov.  Will forward to [place].

## 2012-08-27 ENCOUNTER — Ambulatory Visit (INDEPENDENT_AMBULATORY_CARE_PROVIDER_SITE_OTHER): Payer: Medicaid Other | Admitting: Internal Medicine

## 2012-08-27 ENCOUNTER — Encounter: Payer: Self-pay | Admitting: Internal Medicine

## 2012-08-27 VITALS — BP 130/90 | HR 72 | Temp 97.7°F | Wt 209.0 lb

## 2012-08-27 DIAGNOSIS — J449 Chronic obstructive pulmonary disease, unspecified: Secondary | ICD-10-CM

## 2012-08-27 DIAGNOSIS — I2699 Other pulmonary embolism without acute cor pulmonale: Secondary | ICD-10-CM

## 2012-08-27 DIAGNOSIS — I10 Essential (primary) hypertension: Secondary | ICD-10-CM

## 2012-08-27 DIAGNOSIS — F17200 Nicotine dependence, unspecified, uncomplicated: Secondary | ICD-10-CM

## 2012-08-27 NOTE — Assessment & Plan Note (Signed)
He has quit. Encouraged to maintain abstinence

## 2012-08-27 NOTE — Progress Notes (Signed)
Very complicated patient- Had acute respiratory failure required ventilatory support, trach. He has finally quit smoking but he is craving them.  He is not as compliant with symbicort as he should be.   Reviewed pmh, psh, sochx, reviewedmeds  Ros:- he is thinking about pulmonary rehab- He admits to being short winded but he is much better.  He has some neck soreness- also improving   well-developed well-nourished male in no acute distress. HEENT exam atraumatic, normocephalic, neck supple without jugular venous distention. Terrible dentition Chest clear to auscultation cardiac exam S1-S2 are regular. Abdominal exam overweight with bowel sounds, soft and nontender. Extremities no edema. Neurologic exam is alert with a normal gait.  BPs at home 118/70s

## 2012-08-27 NOTE — Assessment & Plan Note (Signed)
Has IVC filter- Determined that he does not need anticoagulation

## 2012-08-27 NOTE — Assessment & Plan Note (Signed)
Home bps 120s/70s

## 2012-08-28 ENCOUNTER — Ambulatory Visit: Payer: Medicaid Other | Admitting: Internal Medicine

## 2012-08-28 NOTE — Telephone Encounter (Signed)
Pt has an appt for PFT on 10-01-12 and ov after. Carron Curie, CMA

## 2012-08-29 NOTE — Assessment & Plan Note (Signed)
It's stunning that patient survived COPD exacerbation with pneumonia He is doing quite well and not smoking Absolute tobacco abstinence reinforced

## 2012-09-02 ENCOUNTER — Ambulatory Visit: Payer: Medicaid Other | Admitting: Internal Medicine

## 2012-09-15 ENCOUNTER — Ambulatory Visit (INDEPENDENT_AMBULATORY_CARE_PROVIDER_SITE_OTHER)
Admission: RE | Admit: 2012-09-15 | Discharge: 2012-09-15 | Disposition: A | Discharge status: Home / Self Care | Payer: Medicaid Other | Source: Ambulatory Visit | Attending: Internal Medicine | Admitting: Internal Medicine

## 2012-09-15 DIAGNOSIS — J869 Pyothorax without fistula: Secondary | ICD-10-CM

## 2012-09-22 ENCOUNTER — Telehealth: Payer: Self-pay | Admitting: Internal Medicine

## 2012-09-22 NOTE — Telephone Encounter (Signed)
When he comes for fu, I need to talk to his new pcp Huntley Dec Meinhart PA and Vilinda Boehringer MD or Jeanella Flattery PA with Arbie Cookey MD of [facility] [phone]. They are trying to get records to make a decision on remiving IVC filter and [facility] records they got were not helpful

## 2012-10-01 ENCOUNTER — Telehealth: Payer: Self-pay | Admitting: Internal Medicine

## 2012-10-01 ENCOUNTER — Ambulatory Visit: Payer: Medicaid Other | Admitting: Internal Medicine

## 2012-10-01 NOTE — Telephone Encounter (Signed)
He no showed 10/01/2012 I keep getting leetters from his pcp in [place] telphine [phone] about questions on IVC filter and MRI (dr Veronda Prude). Please tell them to call directly on my cell so I can help answer questions  Dr. Kalman Shan, M.D., [facility].C.P Pulmonary and Critical Care Medicine Staff Physician [place] [facility] [place] [facility] and Critical Care Pager: [phone], If no answer or between  15:00h - 7:00h: call [phone]  10/01/2012 10:38 PM

## 2012-10-02 NOTE — Telephone Encounter (Signed)
I spoke with the pt and he states he has not been able to come in for appt due to pending medicaid application. He states once this gets straightened out he will come in for OV. Pt advised of CT results. Pt pcp office is closed. Since I will not be here for 2 weeks I am going to forward this message to triage to be taken care of on Monday.   Triage please call the pt PCP and give them MR cell # [phone], so he can discuss this pt with him. Thanks!! Carron Curie, CMA

## 2012-10-05 ENCOUNTER — Telehealth: Payer: Self-pay | Admitting: Internal Medicine

## 2012-10-05 NOTE — Telephone Encounter (Signed)
I called pt PCP and gave MR cell. They will give the message over. Will forward to MR so he isa ware

## 2012-10-05 NOTE — Telephone Encounter (Signed)
error 

## 2012-10-05 NOTE — Telephone Encounter (Signed)
Noted thanks °

## 2012-11-02 ENCOUNTER — Other Ambulatory Visit: Payer: Self-pay | Admitting: *Deleted

## 2012-11-02 MED ORDER — HYDROCHLOROTHIAZIDE 12.5 MG PO TABS: 25.0000 mg | ORAL_TABLET | Freq: Every day | ORAL | Status: DC

## 2012-12-16 ENCOUNTER — Encounter: Payer: Self-pay | Admitting: Pulmonary Disease

## 2012-12-16 ENCOUNTER — Telehealth: Payer: Self-pay | Admitting: *Deleted

## 2012-12-16 ENCOUNTER — Ambulatory Visit (INDEPENDENT_AMBULATORY_CARE_PROVIDER_SITE_OTHER): Payer: Medicaid Other | Admitting: Pulmonary Disease

## 2012-12-16 VITALS — BP 110/72 | HR 85 | Temp 97.1°F | Ht 70.0 in | Wt 199.2 lb

## 2012-12-16 DIAGNOSIS — I2699 Other pulmonary embolism without acute cor pulmonale: Secondary | ICD-10-CM

## 2012-12-16 DIAGNOSIS — J449 Chronic obstructive pulmonary disease, unspecified: Secondary | ICD-10-CM

## 2012-12-16 DIAGNOSIS — J4489 Other specified chronic obstructive pulmonary disease: Secondary | ICD-10-CM

## 2012-12-16 MED ORDER — TIOTROPIUM BROMIDE MONOHYDRATE 18 MCG IN CAPS: 18.0000 ug | ORAL_CAPSULE | Freq: Every day | RESPIRATORY_TRACT | Status: DC

## 2012-12-16 NOTE — Assessment & Plan Note (Addendum)
filter is removable after 8 months -  discussed with radiologist Rpt venous duplex If DVT, he may need to be on blood thinner

## 2012-12-16 NOTE — Progress Notes (Signed)
  Subjective:    Patient ID: Cody [name], male    DOB: 1950/10/23, 62 y.o.   MRN: [id number]  HPI PCP- Georgette Shell, Smitty Cords  (PA) 62 year old smoker for followup of COPD, hypoxia and DVT/PE.   #Admitted 04/22/2012 through 05/29/2012  - Influenza A, MEtapneumoviris and H. Influenzae related acute respiratory failure resulting in prolonged mechanical ventilation and status post tracheostomy 04/29/2012  - Course complicated by  - Delirium - resolved  - A Fib - resolved 05/09/12  - Acute Kidnye Injury - resolved  - Enterobacter UTI 05/14/12 - resolved  - Left-sided empyema status post left-sided pigtail catheter  - Pulmonary embolism with bilateral DVT 05/02/12 and 05/03/12: Rx with IV heparin/coumadin but then needing retrievable IVC filterin addition 05/04/2012 Due to MOBILE DVT & bleeding from tracheostomy site - s/p PEG tube 05/15/12  - deconditioning with transfer to inpatient pulmonary rehabilitation  #Admitted 06/03/2012 through 06/18/2012  - Acute and chronic critical illness . Methicillin sensitive staphylococcal aureus pneumonia and associated left-sided empyema with cavitary pneumonia as of 06/09/12 CT chest (new finding) and s/p Catheter placement 06/11/12 under CT guidance  - Course complicated by  - 9 cm chest wall hematoma 06/16/12 and dc of IV heparin  - Removal of PEG tube  - Deconditioning requiring inpatient rehabilitation    12/16/2012 79m FU  He was lost to followup for 5 months and is now back with worsening hypoxia -he has established with [facility]  Oxygen saturation was 76% on arrival are improved to 90% on 2 L nasal cannula CXR  12/15/12- no pna  He admits to slipping up and Started smoking again - glue allergy from patch, chantix makes him 'dumb', trying gum  He is not on anticoagulation and has his IVC filter.  He has seen a hematologist for polycythemia He reports an episode of blood in stool but has refused colonoscopy He was working as a Curator for [employer], [employer] - small  business  Past Medical History  Diagnosis Date  . Stroke   . Hypertension   . Hyperlipidemia   . GERD (gastroesophageal reflux disease)   . COPD (chronic obstructive pulmonary disease)       Review of Systems  neg for any significant sore throat, dysphagia, itching, sneezing, nasal congestion or excess/ purulent secretions, fever, chills, sweats, unintended wt loss, pleuritic or exertional cp, hempoptysis, orthopnea pnd or change in chronic leg swelling. Also denies presyncope, palpitations, heartburn, abdominal pain, nausea, vomiting, diarrhea or change in bowel or urinary habits, dysuria,hematuria, rash, arthralgias, visual complaints, headache, numbness weakness or ataxia.     Objective:   Physical Exam  Gen. Pleasant, well-nourished, in no distress, normal affect ENT - no lesions, no post nasal drip Neck: No JVD, no thyromegaly, no carotid bruits Lungs: no use of accessory muscles, no dullness to percussion, clear without rales or rhonchi  Cardiovascular: Rhythm regular, heart sounds  normal, no murmurs, no peripheral edema Abdomen: soft and non-tender, no hepatosplenomegaly, BS normal. Musculoskeletal: No deformities, no cyanosis or clubbing Neuro:  alert, non focal Skin:  Warm, no lesions/ rash       Assessment & Plan:

## 2012-12-16 NOTE — Telephone Encounter (Signed)
Message copied by Tommie Sams on Wed Dec 16, 2012  4:53 PM ------      Message from: Cyril Mourning V      Created: Wed Dec 16, 2012  4:43 PM       I spoke to radiologist & filter may be removable      He needs to have a duplex study done ------

## 2012-12-16 NOTE — Patient Instructions (Addendum)
You have to stop smoking - OK to use nicotine gum We will start you on oxygen Take symbicort twice daily Trial of spiriva - call us back if this works for Rx I doubt filter is removable after 8 months but will discuss with radiologist If not removable, you may need to be on blood thinner Schedule breathing test

## 2012-12-16 NOTE — Telephone Encounter (Signed)
lmtcb x1 for pt. 

## 2012-12-16 NOTE — Assessment & Plan Note (Addendum)
Smoking cessation paramount - You have to stop smoking - OK to use nicotine gum We will start you on oxygen - Rx sent to DME Take symbicort twice daily Trial of spiriva - call us back if this works for Rx Spirometry -pre/post May benefit from pulmonary rehabilitation in the future

## 2012-12-16 NOTE — Telephone Encounter (Signed)
Pt is aware and he is also already scheduled for test. He needed nothing further

## 2012-12-16 NOTE — Telephone Encounter (Signed)
Pt returned triage's call & asked to be reached at [phone] or [phone].  Cody [name]

## 2012-12-17 ENCOUNTER — Encounter: Payer: Self-pay | Admitting: Internal Medicine

## 2012-12-17 ENCOUNTER — Telehealth: Payer: Self-pay | Admitting: Pulmonary Disease

## 2012-12-17 NOTE — Telephone Encounter (Signed)
Pl tell them to deliver O2  - othersie giv eto different DME CMN will be signed soon

## 2012-12-17 NOTE — Telephone Encounter (Signed)
I spoke with Cody [name]. She stated the branch manager just called her and told her to deliver the O2 now. She stated they are in the process of trying to get a hold of pt. Will forward back to RA as an [place]

## 2012-12-17 NOTE — Telephone Encounter (Signed)
Form was giving to Dr. Vassie Loll today.  I called [facility] and spoke with Gene and made her aware. Please advise RA pt can;t be delivered O2 until this is signed

## 2012-12-18 ENCOUNTER — Encounter (INDEPENDENT_AMBULATORY_CARE_PROVIDER_SITE_OTHER): Payer: Medicaid Other

## 2012-12-18 DIAGNOSIS — I87009 Postthrombotic syndrome without complications of unspecified extremity: Secondary | ICD-10-CM

## 2012-12-18 DIAGNOSIS — I2699 Other pulmonary embolism without acute cor pulmonale: Secondary | ICD-10-CM

## 2012-12-18 DIAGNOSIS — Z86718 Personal history of other venous thrombosis and embolism: Secondary | ICD-10-CM

## 2012-12-21 ENCOUNTER — Other Ambulatory Visit: Payer: Self-pay | Admitting: Pulmonary Disease

## 2012-12-21 DIAGNOSIS — I2699 Other pulmonary embolism without acute cor pulmonale: Secondary | ICD-10-CM

## 2012-12-22 ENCOUNTER — Other Ambulatory Visit: Payer: Self-pay | Admitting: Pulmonary Disease

## 2012-12-22 DIAGNOSIS — I2699 Other pulmonary embolism without acute cor pulmonale: Secondary | ICD-10-CM

## 2012-12-23 ENCOUNTER — Telehealth: Payer: Self-pay | Admitting: Internal Medicine

## 2012-12-23 NOTE — Telephone Encounter (Signed)
I spoke with Inetta Fermo. I advised her pt has already had lower extremity on 12/18/12. She was able to pull this up. Nothing further needed

## 2012-12-28 ENCOUNTER — Other Ambulatory Visit: Payer: Self-pay | Admitting: Radiology

## 2012-12-29 ENCOUNTER — Encounter (HOSPITAL_COMMUNITY): Payer: Self-pay | Admitting: Pharmacy Technician

## 2012-12-31 ENCOUNTER — Encounter (HOSPITAL_COMMUNITY): Payer: Self-pay

## 2012-12-31 ENCOUNTER — Ambulatory Visit (HOSPITAL_COMMUNITY)
Admission: RE | Admit: 2012-12-31 | Discharge: 2012-12-31 | Disposition: A | Discharge status: Home / Self Care | Payer: Medicaid Other | Source: Ambulatory Visit | Attending: Pulmonary Disease | Admitting: Pulmonary Disease

## 2012-12-31 DIAGNOSIS — I10 Essential (primary) hypertension: Secondary | ICD-10-CM | POA: Insufficient documentation

## 2012-12-31 DIAGNOSIS — Z7901 Long term (current) use of anticoagulants: Secondary | ICD-10-CM | POA: Insufficient documentation

## 2012-12-31 DIAGNOSIS — E119 Type 2 diabetes mellitus without complications: Secondary | ICD-10-CM | POA: Insufficient documentation

## 2012-12-31 DIAGNOSIS — Z4689 Encounter for fitting and adjustment of other specified devices: Secondary | ICD-10-CM | POA: Insufficient documentation

## 2012-12-31 DIAGNOSIS — K219 Gastro-esophageal reflux disease without esophagitis: Secondary | ICD-10-CM | POA: Insufficient documentation

## 2012-12-31 DIAGNOSIS — E785 Hyperlipidemia, unspecified: Secondary | ICD-10-CM | POA: Insufficient documentation

## 2012-12-31 DIAGNOSIS — J4489 Other specified chronic obstructive pulmonary disease: Secondary | ICD-10-CM | POA: Insufficient documentation

## 2012-12-31 DIAGNOSIS — Z79899 Other long term (current) drug therapy: Secondary | ICD-10-CM | POA: Insufficient documentation

## 2012-12-31 DIAGNOSIS — Z7982 Long term (current) use of aspirin: Secondary | ICD-10-CM | POA: Insufficient documentation

## 2012-12-31 DIAGNOSIS — J449 Chronic obstructive pulmonary disease, unspecified: Secondary | ICD-10-CM | POA: Insufficient documentation

## 2012-12-31 DIAGNOSIS — Z86718 Personal history of other venous thrombosis and embolism: Secondary | ICD-10-CM | POA: Insufficient documentation

## 2012-12-31 DIAGNOSIS — Z8673 Personal history of transient ischemic attack (TIA), and cerebral infarction without residual deficits: Secondary | ICD-10-CM | POA: Insufficient documentation

## 2012-12-31 DIAGNOSIS — Z86711 Personal history of pulmonary embolism: Secondary | ICD-10-CM | POA: Insufficient documentation

## 2012-12-31 DIAGNOSIS — I2699 Other pulmonary embolism without acute cor pulmonale: Secondary | ICD-10-CM

## 2012-12-31 HISTORY — DX: Hemochromatosis, unspecified: E83.119

## 2012-12-31 HISTORY — DX: Pain in unspecified shoulder: M25.519

## 2012-12-31 HISTORY — DX: Type 2 diabetes mellitus without complications: E11.9

## 2012-12-31 HISTORY — DX: Dependence on supplemental oxygen: Z99.81

## 2012-12-31 HISTORY — DX: Shortness of breath: R06.02

## 2012-12-31 HISTORY — DX: Anxiety disorder, unspecified: F41.9

## 2012-12-31 HISTORY — DX: Other pulmonary embolism without acute cor pulmonale: I26.99

## 2012-12-31 LAB — CBC
HCT: 63.1 % — ABNORMAL HIGH (ref 39.0–52.0)
Hemoglobin: 20.9 g/dL — ABNORMAL HIGH (ref 13.0–17.0)
MCV: 81.4 fL (ref 78.0–100.0)
RDW: 19 % — ABNORMAL HIGH (ref 11.5–15.5)
WBC: 8.4 10*3/uL (ref 4.0–10.5)

## 2012-12-31 LAB — BASIC METABOLIC PANEL
Calcium: 9.4 mg/dL (ref 8.4–10.5)
Chloride: 98 mEq/L (ref 96–112)
Creatinine, Ser: 0.87 mg/dL (ref 0.50–1.35)
GFR calc Af Amer: >90 mL/min (ref >90)
GFR calc non Af Amer: 89 mL/min — ABNORMAL LOW (ref >90)

## 2012-12-31 LAB — APTT: aPTT: 63 seconds — ABNORMAL HIGH (ref 24–37)

## 2012-12-31 LAB — PROTIME-INR
INR: 1.21 (ref 0.00–1.49)
Prothrombin Time: 15 seconds (ref 11.6–15.2)

## 2012-12-31 MED ORDER — FENTANYL CITRATE 0.05 MG/ML IJ SOLN
INTRAMUSCULAR | Status: AC | PRN
  Administered 2012-12-31: 100 ug via INTRAVENOUS

## 2012-12-31 MED ORDER — FENTANYL CITRATE 0.05 MG/ML IJ SOLN
INTRAMUSCULAR | Status: AC
  Filled 2012-12-31: qty 2

## 2012-12-31 MED ORDER — IOHEXOL 300 MG/ML  SOLN
80.0000 mL | Freq: Once | INTRAMUSCULAR | Status: AC | PRN
  Administered 2012-12-31: 80 mL

## 2012-12-31 MED ORDER — MIDAZOLAM HCL 2 MG/2ML IJ SOLN
INTRAMUSCULAR | Status: AC | PRN
  Administered 2012-12-31: 2 mg via INTRAVENOUS

## 2012-12-31 MED ORDER — SODIUM CHLORIDE 0.9 % IV SOLN
INTRAVENOUS | Status: DC
  Administered 2012-12-31: 08:00:00 via INTRAVENOUS

## 2012-12-31 MED ORDER — MIDAZOLAM HCL 2 MG/2ML IJ SOLN
INTRAMUSCULAR | Status: AC
  Filled 2012-12-31: qty 2

## 2012-12-31 NOTE — H&P (Signed)
Chief Complaint: "I'm for my filter removal" Referring Physician:Alva HPI: Cody [name] is an 62 y.o. male with prior hx of DVT and PE who had IVC filter placed in 1/14. He has now completed his regimen of anticoagulation and has had repeat venous duplex finding no evidence of DVT. IR is asked to remove his IVC filter. PMHx and meds reviewed. Pt feels well this morning, aside from some anxiety  Past Medical History:  Past Medical History  Diagnosis Date  . Hypertension   . Hyperlipidemia   . GERD (gastroesophageal reflux disease)   . COPD (chronic obstructive pulmonary disease)   . Shortness of breath   . Diabetes mellitus without complication   . Stroke     2007  . Pulmonary embolism 05/2012    filter for blood clots  . Shoulder pain     left  . Oxygen dependent   . Anxiety     panic attacks  . Hemochromatosis     Past Surgical History:  Past Surgical History  Procedure Laterality Date  . Gastrostomy N/A 05/19/2012    Procedure: PEG Possible Open Gastrostomy;  Surgeon: Kandis Cocking, MD;  Location: [facility] OR;  Service: General;  Laterality: N/A;  . Video bronchoscopy Bilateral 06/03/2012    Procedure: VIDEO BRONCHOSCOPY WITHOUT FLUORO;  Surgeon: [name], MD;  Location: [facility] ENDOSCOPY;  Service: Cardiopulmonary;  Laterality: Bilateral;  . Multiple tooth extractions      Family History:  Family History  Problem Relation Age of Onset  . COPD Mother     Social History:  reports that he has been smoking Cigarettes.  He has a 50 pack-year smoking history. He has never used smokeless tobacco. He reports that he does not drink alcohol or use illicit drugs.  Allergies:  Allergies  Allergen Reactions  . Protamine Anaphylaxis    Medications:   Medication List    ASK your doctor about these medications       amLODipine 10 MG tablet  Commonly known as:  NORVASC  Take 5 mg by mouth every morning. Take 0.5 tablets (5 mg total) by mouth daily.     aspirin 81 MG  chewable tablet  Chew 1 tablet (81 mg total) by mouth daily.     budesonide-formoterol 160-4.5 MCG/ACT inhaler  Commonly known as:  SYMBICORT  Inhale 2 puffs into the lungs 2 (two) times daily as needed     GLUCOPHAGE XR 500 MG 24 hr tablet  Generic drug:  metFORMIN  500 mg. Take 1 tablet (500 mg total) by mouth with breakfast.     hydrochlorothiazide 12.5 MG tablet  Commonly known as:  HYDRODIURIL  Take 2 tablets (25 mg total) by mouth daily.     losartan 25 MG tablet  Commonly known as:  COZAAR  Take 25 mg by mouth every morning.     tiotropium 18 MCG inhalation capsule  Commonly known as:  SPIRIVA  Place 1 capsule (18 mcg total) into inhaler and inhale daily.        Please HPI for pertinent positives, otherwise complete 10 system ROS negative.  Physical Exam: BP 128/89  Pulse 87  Temp(Src) 98 F (36.7 C) (Oral)  Resp 24  Wt 198 lb 4 oz (89.926 kg)  BMI 28.45 kg/m2  SpO2 91% Body mass index is 28.45 kg/(m^2).   General Appearance:  Alert, cooperative, no distress, appears stated age  Head:  Normocephalic, without obvious abnormality, atraumatic  ENT: Unremarkable  Neck: Supple, symmetrical, trachea midline  Lungs:   Clear to auscultation bilaterally, no w/r/r, respirations unlabored without use of accessory muscles.  Chest Wall:  No tenderness or deformity  Heart:  Regular rate and rhythm, S1, S2 normal, no murmur, rub or gallop.  Abdomen:   Soft, non-tender, non distended.  Extremities: Extremities normal, atraumatic, no cyanosis or edema  Pulses: 2+ and symmetric  Neurologic: Normal affect, no gross deficits.   Results for orders placed during the hospital encounter of 12/31/12 (from the past 48 hour(s))  BASIC METABOLIC PANEL     Status: Abnormal   Collection Time    12/31/12  8:00 AM      Result Value Range   Sodium 138  135 - 145 mEq/L   Potassium 3.7  3.5 - 5.1 mEq/L   Chloride 98  96 - 112 mEq/L   CO2 30  19 - 32 mEq/L   Glucose, Bld 126 (*) 70 -  99 mg/dL   BUN 20  6 - 23 mg/dL   Creatinine, Ser 8.29  0.50 - 1.35 mg/dL   Calcium 9.4  8.4 - 56.2 mg/dL   GFR calc non Af Amer 89 (*) >90 mL/min   GFR calc Af Amer >90  >90 mL/min   Comment: (NOTE)     The eGFR has been calculated using the CKD EPI equation.     This calculation has not been validated in all clinical situations.     eGFR's persistently <90 mL/min signify possible Chronic Kidney     Disease.  CBC     Status: Abnormal   Collection Time    12/31/12  8:00 AM      Result Value Range   WBC 8.4  4.0 - 10.5 K/uL   RBC 7.75 (*) 4.22 - 5.81 MIL/uL   Hemoglobin 20.9 (*) 13.0 - 17.0 g/dL   HCT 13.0 (*) 86.5 - 78.4 %   MCV 81.4  78.0 - 100.0 fL   MCH 27.0  26.0 - 34.0 pg   MCHC 33.1  30.0 - 36.0 g/dL   RDW 69.6 (*) 29.5 - 28.4 %   Platelets 225  150 - 400 K/uL   No results found.  Assessment/Plan Hx DVT-resolved Discussed IVC filter retrieval. Explained procedure, risks, complications, use of sedation. Labs reviewed. Consent signed in chart  Brayton El PA-C 12/31/2012, 9:02 AM

## 2012-12-31 NOTE — Procedures (Signed)
Interventional Radiology Procedure Note  Procedure:  Successful retrieval of IVC filter. Complications:  None Recommendations: - Bedrest x 2 hrs  Signed,  Sterling Big, MD [facility]

## 2012-12-31 NOTE — H&P (Signed)
Agree with PA note.  Signed,  Amarissa Koerner K. Tinna Kolker, MD [facility]  

## 2013-01-13 ENCOUNTER — Encounter: Payer: Self-pay | Admitting: Internal Medicine

## 2013-01-19 ENCOUNTER — Encounter: Payer: Self-pay | Admitting: Internal Medicine

## 2013-01-19 ENCOUNTER — Ambulatory Visit (INDEPENDENT_AMBULATORY_CARE_PROVIDER_SITE_OTHER): Payer: Medicaid Other | Admitting: Internal Medicine

## 2013-01-19 VITALS — BP 120/82 | HR 68 | Ht 70.0 in | Wt 196.2 lb

## 2013-01-19 DIAGNOSIS — F17200 Nicotine dependence, unspecified, uncomplicated: Secondary | ICD-10-CM

## 2013-01-19 DIAGNOSIS — Z1211 Encounter for screening for malignant neoplasm of colon: Secondary | ICD-10-CM

## 2013-01-19 DIAGNOSIS — K625 Hemorrhage of anus and rectum: Secondary | ICD-10-CM

## 2013-01-19 MED ORDER — MOVIPREP 100 G PO SOLR: ORAL | Status: DC

## 2013-01-19 NOTE — Patient Instructions (Addendum)
You have been given a separate informational sheet regarding your tobacco use, the importance of quitting and local resources to help you quit.  You have been scheduled for a colonoscopy with propofol. Please follow written instructions given to you at your visit today.  Please pick up your prep kit at the pharmacy within the next 1-3 days. If you use inhalers (even only as needed), please bring them with you on the day of your procedure. Your physician has requested that you go to www.startemmi.com and enter the access code given to you at your visit today. This web site gives a general overview about your procedure. However, you should still follow specific instructions given to you by our office regarding your preparation for the procedure.  We have sent the following medications to your pharmacy for you to pick up at your convenience: Moviprep                                               We are excited to introduce MyChart, a new best-in-class service that provides you online access to important information in your electronic medical record. We want to make it easier for you to view your health information - all in one secure location - when and where you need it. We expect MyChart will enhance the quality of care and service we provide.  When you register for MyChart, you can:    View your test results.    Request appointments and receive appointment reminders via email.    Request medication renewals.    View your medical history, allergies, medications and immunizations.    Communicate with your physician's office through a password-protected site.    Conveniently print information such as your medication lists.  To find out if MyChart is right for you, please talk to a member of our clinical staff today. We will gladly answer your questions about this free health and wellness tool.  If you are age 64 or older and want a member of your family to have access to your record, you  must provide written consent by completing a proxy form available at our office. Please speak to our clinical staff about guidelines regarding accounts for patients younger than age 72.  As you activate your MyChart account and need any technical assistance, please call the MyChart technical support line at [phone]) or email your question to [email or web address]. If you email your question(s), please include your name, a return phone number and the best time to reach you.  If you have non-urgent health-related questions, you can send a message to our office through MyChart at [email or web address]. If you have a medical emergency, call 911.  Thank you for using MyChart as your new health and wellness resource!   MyChart licensed from [employer],  4540-9811. Patents Pending.

## 2013-01-19 NOTE — Progress Notes (Signed)
Patient ID: Cody [name], male   DOB: 04/25/1950, 62 y.o.   MRN: [id number] HPI: Mr. Pfost is a 62 year old male with past medical history of COPD, diabetes, hypertension, hyperlipidemia, polycythemia, tobacco abuse and prolonged, complicated hospitalization in January through March 2014 (influenza and H. influenza acute respiratory failure with prolonged mechanical ventilation, status post tracheostomy and PEG placement, ICU delirium, acute kidney injury, left-sided empyema, bilateral DVT with PE, IVC filter placement, 9 cm chest wall hematoma felt secondary to heparin) who is seen in consultation at the request of Dr. Cyndia Bent for evaluation of rectal bleeding and colorectal cancer screening.  He is here alone today. He reports in late August having an episode of bright red blood per rectum. He reports this was painless in nature. It occurred over 2 consecutive bowel movements and he describes this as red blood on the stool and on the toilet tissue. This resolved and has not returned. He feels it is secondary to over use of Goody's powders in the days preceding the episode for headaches. He denies change in bowel habit and reports one formed brown stool daily. He denies melena. He denies abdominal pain. Reports good appetite without nausea or vomiting. He denies trouble swallowing and reports no heartburn. He has never had a colonoscopy. He is no longer on anticoagulation. He is interested in colonoscopy but would like to wait until December 2014 her January 2015 because he is being given a work over the next several weeks. They're planning to remove all of his teeth and eventually replace with dentures  Past Medical History  Diagnosis Date  . Hypertension   . Hyperlipidemia   . GERD (gastroesophageal reflux disease)   . COPD (chronic obstructive pulmonary disease)   . Shortness of breath   . Diabetes mellitus without complication   . Stroke     2007  . Pulmonary embolism 05/2012    filter for blood  clots  . Shoulder pain     left  . Oxygen dependent   . Anxiety     panic attacks  . Rectal bleed     Past Surgical History  Procedure Laterality Date  . Gastrostomy N/A 05/19/2012    Procedure: PEG Possible Open Gastrostomy;  Surgeon: Kandis Cocking, MD;  Location: [facility] OR;  Service: General;  Laterality: N/A;  . Video bronchoscopy Bilateral 06/03/2012    Procedure: VIDEO BRONCHOSCOPY WITHOUT FLUORO;  Surgeon: [name], MD;  Location: [facility] ENDOSCOPY;  Service: Cardiopulmonary;  Laterality: Bilateral;  . Multiple tooth extractions      Current Outpatient Prescriptions  Medication Sig Dispense Refill  . amLODipine (NORVASC) 10 MG tablet Take 5 mg by mouth every morning. Take 0.5 tablets (5 mg total) by mouth daily.      Marland Kitchen aspirin 81 MG chewable tablet Chew 1 tablet (81 mg total) by mouth daily.      . budesonide-formoterol (SYMBICORT) 160-4.5 MCG/ACT inhaler Inhale 2 puffs into the lungs 2 (two) times daily as needed      . hydrochlorothiazide (HYDRODIURIL) 12.5 MG tablet Take 2 tablets (25 mg total) by mouth daily.  90 tablet  3  . losartan (COZAAR) 25 MG tablet Take 25 mg by mouth every morning.      . metFORMIN (GLUCOPHAGE XR) 500 MG 24 hr tablet 500 mg. Take 1 tablet (500 mg total) by mouth with breakfast.      . tiotropium (SPIRIVA) 18 MCG inhalation capsule Place 1 capsule (18 mcg total) into inhaler and inhale daily.  20 capsule  0  . MOVIPREP 100 G SOLR Use per prep instruction  1 kit  0   No current facility-administered medications for this visit.    Allergies  Allergen Reactions  . Protamine Anaphylaxis  . Ace Inhibitors Cough    Family History  Problem Relation Age of Onset  . COPD Mother     History  Substance Use Topics  . Smoking status: Current Every Day Smoker -- 1.00 packs/day for 50 years    Types: Cigarettes    Last Attempt to Quit: 12/13/2012  . Smokeless tobacco: Never Used     Comment: pt did quit back in march 2014-12/16/12  . Alcohol Use:  No    ROS: As per history of present illness, otherwise negative  BP 120/82  Pulse 68  Ht 5\' 10"  (1.778 m)  Wt 196 lb 3.2 oz (88.996 kg)  BMI 28.15 kg/m2 Constitutional: Well-developed and well-nourished. No distress. HEENT: Normocephalic and atraumatic. Oropharynx is clear and moist. No oropharyngeal exudate. Conjunctivae are normal.  No scleral icterus. Poor dentition Neck: Neck supple. Trachea midline. She ostomy scar well healed Cardiovascular: Normal rate, regular rhythm and intact distal pulses.  Pulmonary/chest: Effort normal and breath sounds normal. No wheezing, rales or rhonchi. Abdominal: Soft, nontender, nondistended. Bowel sounds active throughout. PEG tube site left upper midabdomen is well-healed Extremities: no clubbing, cyanosis, or edema Neurological: Alert and oriented to person place and time. Skin: Skin is warm and dry. No rashes noted. Psychiatric: Normal mood and affect. Behavior is normal.  RELEVANT LABS AND IMAGING: CBC    Component Value Date/Time   WBC 8.4 12/31/2012 0800   RBC 7.75* 12/31/2012 0800   HGB 20.9* 12/31/2012 0800   HCT 63.1* 12/31/2012 0800   PLT 225 12/31/2012 0800   MCV 81.4 12/31/2012 0800   MCH 27.0 12/31/2012 0800   MCHC 33.1 12/31/2012 0800   RDW 19.0* 12/31/2012 0800   LYMPHSABS 1.6 06/01/2012 0700   MONOABS 1.4* 06/01/2012 0700   EOSABS 0.4 06/01/2012 0700   BASOSABS 0.0 06/01/2012 0700    CMP     Component Value Date/Time   NA 138 12/31/2012 0800   K 3.7 12/31/2012 0800   CL 98 12/31/2012 0800   CO2 30 12/31/2012 0800   GLUCOSE 126* 12/31/2012 0800   BUN 20 12/31/2012 0800   CREATININE 0.87 12/31/2012 0800   CALCIUM 9.4 12/31/2012 0800   PROT 6.8 06/11/2012 1148   ALBUMIN 2.2* 06/08/2012 0700   AST 17 06/01/2012 0700   ALT 27 06/01/2012 0700   ALKPHOS 99 06/01/2012 0700   BILITOT 0.3 06/01/2012 0700   GFRNONAA 89* 12/31/2012 0800   GFRAA >90 12/31/2012 0800    ASSESSMENT/PLAN: 62 year old male with past medical history of COPD,  diabetes, hypertension, hyperlipidemia, polycythemia, tobacco abuse and prolonged, complicated hospitalization in January through March 2014 (influenza and H. influenza acute respiratory failure with prolonged mechanical ventilation, status post tracheostomy and PEG placement, ICU delirium, acute kidney injury, left-sided empyema, bilateral DVT with PE, IVC filter placement, 9 cm chest wall hematoma felt secondary to heparin) who is seen in consultation at the request of Dr. Cyndia Bent for evaluation of rectal bleeding and colorectal cancer screening.  1.  CRC screening/rectal bleeding -- the patient had an isolated episode of rectal bleeding, which was painless in nature. Based on this and the fact that he has never had screening colonoscopy, I have recommended colonoscopy for him. We discussed the test today including the risks and benefits and he is  agreeable to proceed. Because of his extensive pulmonary history and COPD, I recommended this test be performed as an outpatient hospital setting with anesthesia support. He understands and agrees with this recommendation. He has requested December January, which we should be able to accommodate. I've asked that he notify me immediately should any rectal bleeding return. He voices understanding.  2.  Tobacco use -- he started smoking again one month ago, I have advised cessation. He is aware of the fact that multiple doctors have recommended he stop smoking.  He feels that will be difficult for him, but is aware of resources available should he desire to quit.

## 2013-01-26 ENCOUNTER — Ambulatory Visit: Payer: Medicaid Other | Admitting: Internal Medicine

## 2013-02-18 ENCOUNTER — Encounter (HOSPITAL_COMMUNITY): Payer: Self-pay | Admitting: Respiratory Therapy

## 2013-02-22 ENCOUNTER — Other Ambulatory Visit: Payer: Self-pay | Admitting: Oral Surgery

## 2013-02-23 NOTE — Pre-Procedure Instructions (Signed)
Cody [name]  02/23/2013   Your procedure is scheduled on:  Tuesday,November 25th.  Report to [facility], Main Entrance/Entrance "A" at 7:00 AM.  Call this number if you have problems the morning of surgery: [phone]   Remember:   Do not eat food or drink liquids after midnight on Monday.    Take these medicines the morning of surgery with A SIP OF WATER: Amlodipine (Norvasc). May use Symbicort.   Do not wear jewelry  Do not wear lotions, powders, or perfumes. You may wear deodorant.   Men may shave face and neck.  Do not bring valuables to the hospital.  [facility] is not responsible  for any belongings or valuables.               Contacts, dentures or bridgework may not be worn into surgery.  Leave suitcase in the car. After surgery it may be brought to your room.  For patients admitted to the hospital, discharge time is determined by your treatment team.               Patients discharged the day of surgery will not be allowed to drive home.  Name and phone number of your driver: -   Special Instructions: Shower using CHG 2 nights before surgery and the night before surgery.  If you shower the day of surgery use CHG.  Use special wash - you have one bottle of CHG for all showers.  You should use approximately 1/3 of the bottle for each shower.   Please read over the following fact sheets that you were given: Pain Booklet, Coughing and Deep Breathing and Surgical Site Infection Prevention

## 2013-02-24 ENCOUNTER — Encounter (HOSPITAL_COMMUNITY): Payer: Self-pay

## 2013-02-24 ENCOUNTER — Encounter (HOSPITAL_COMMUNITY): Payer: Self-pay | Admitting: Vascular Surgery

## 2013-02-24 ENCOUNTER — Encounter (HOSPITAL_COMMUNITY)
Admission: RE | Admit: 2013-02-24 | Discharge: 2013-02-24 | Disposition: A | Discharge status: Home / Self Care | Payer: Medicaid Other | Source: Ambulatory Visit | Attending: Oral Surgery | Admitting: Oral Surgery

## 2013-02-24 ENCOUNTER — Other Ambulatory Visit: Payer: Self-pay | Admitting: Internal Medicine

## 2013-02-24 DIAGNOSIS — Z01812 Encounter for preprocedural laboratory examination: Secondary | ICD-10-CM | POA: Insufficient documentation

## 2013-02-24 DIAGNOSIS — Z01818 Encounter for other preprocedural examination: Secondary | ICD-10-CM | POA: Insufficient documentation

## 2013-02-24 HISTORY — DX: Unspecified osteoarthritis, unspecified site: M19.90

## 2013-02-24 HISTORY — DX: Thoracic aortic aneurysm, without rupture: I71.2

## 2013-02-24 HISTORY — DX: Myoneural disorder, unspecified: G70.9

## 2013-02-24 HISTORY — DX: Secondary polycythemia: D75.1

## 2013-02-24 HISTORY — DX: Disease of blood and blood-forming organs, unspecified: D75.9

## 2013-02-24 HISTORY — DX: Thoracic aortic aneurysm, without rupture, unspecified: I71.20

## 2013-02-24 HISTORY — DX: Epistaxis: R04.0

## 2013-02-24 HISTORY — DX: Pneumonia, unspecified organism: J18.9

## 2013-02-24 LAB — CBC
HCT: 47.7 % (ref 39.0–52.0)
Hemoglobin: 15.8 g/dL (ref 13.0–17.0)
MCHC: 33.1 g/dL (ref 30.0–36.0)
MCV: 84.4 fL (ref 78.0–100.0)
RDW: 20.3 % — ABNORMAL HIGH (ref 11.5–15.5)
WBC: 9.9 10*3/uL (ref 4.0–10.5)

## 2013-02-24 LAB — COMPREHENSIVE METABOLIC PANEL
ALT: 11 U/L (ref 0–53)
Albumin: 4 g/dL (ref 3.5–5.2)
Alkaline Phosphatase: 95 U/L (ref 39–117)
BUN: 12 mg/dL (ref 6–23)
Chloride: 98 mEq/L (ref 96–112)
Creatinine, Ser: 0.87 mg/dL (ref 0.50–1.35)
GFR calc Af Amer: >90 mL/min (ref >90)
GFR calc non Af Amer: 89 mL/min — ABNORMAL LOW (ref >90)
Glucose, Bld: 101 mg/dL — ABNORMAL HIGH (ref 70–99)
Total Bilirubin: 0.6 mg/dL (ref 0.3–1.2)
Total Protein: 7.4 g/dL (ref 6.0–8.3)

## 2013-02-24 NOTE — Progress Notes (Signed)
Uses O2 at night- 1.5-2 liters, has not started Symbicort that Dr. Vassie Loll gave to him on 12/31/2012

## 2013-02-24 NOTE — Progress Notes (Signed)
Call to A. Zelenak,PAC, report of med. History & use of O2, order in place for anesth. Consult.

## 2013-02-24 NOTE — Telephone Encounter (Signed)
Pt returning your call. pls call °

## 2013-02-24 NOTE — Progress Notes (Signed)
02/24/13 0908  OBSTRUCTIVE SLEEP APNEA  Have you ever been diagnosed with sleep apnea through a sleep study? No  Do you snore loudly (loud enough to be heard through closed doors)?  1  Do you often feel tired, fatigued, or sleepy during the daytime? 1  Has anyone observed you stop breathing during your sleep? 1  Do you have, or are you being treated for high blood pressure? 1  BMI more than 35 kg/m2? 0  Age over 64 years old? 1  Neck circumference greater than 40 cm/18 inches? 0  Gender: 1  Obstructive Sleep Apnea Score 6  Score 4 or greater  Results sent to PCP

## 2013-02-24 NOTE — Progress Notes (Addendum)
Anesthesia PAT Evaluation: Patient is a 62 year old male scheduled for dental extractions, alveoloplasty, exostosis, bilateral tori on 03/02/13 by Dr. Barbette Merino.  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  He had a prolonged admission in 04/2012 (remained hospitalized or in rehab until 06/18/12) for acute respiratory failure related to influenza A PNA that required mechanical ventilation, tracheostomy 04/29/12 s/p decannulation in April 2014, and PEG placement 04/12/12 (d/c'd 06/08/12).  He also developed ICU delirium, acute kidney injury, left sided empyema s/p CT per IR 06/11/12 (d/c'd 06/17/12), bilateral DVT with PE s/p IVC filter which has since be removed on 12/31/12, spontaneous 9 cm chest wall hematoma secondary to heparin on 06/16/12.  OSA screening score was 6.  He has never been tested for OSA.  PCP was Dr. Birdie Sons, but he recently switched to Georgette Shell, PA-C with Dr. Antony Haste due to insurance.  Hematologist is Dr. Orion Modest who recommended Lovenox bridge if patient was to be hospitalized post-procedure. Pulmonologist is Dr. Cyril Mourning. GI is Dr. Erick Blinks with plans for colonoscopy with anesthesia net month for further evaluation of single episode of rectal bleeding.   Echo on 04/13/12 showed: - Left ventricle: The cavity size was normal. Wall thickness was increased in a pattern of moderate LVH. Systolic function was normal. The estimated ejection fraction was in the range of 50% to 55%. Wall motion was normal; there were no regional wall motion abnormalities. Left ventricular diastolic function parameters were normal. - Right ventricle: The cavity size was moderately dilated. - Right atrium: The atrium was moderately dilated. - Atrial  septum: No defect or patent foramen ovale was identified.  EKG on 05/23/12 showed NSR, possible LAE, prolonged QT.  CXR on 07/16/12 showed: Improved left pleural effusion. There is residual pleural thickening and linear atelectasis at the left base.   This was followed by a chest CT on 09/15/12 that showed: Mild left pleural thickening. No residual pleural effusion/empyema. 3 mm posterior right upper lobe pulmonary nodule, unchanged. 4-month stability has been demonstrated. A single follow-up CT chest is suggested in 12 months per Fleischner Society guidelines. Mild emphysematous changes. Stable mild mediastinal lymphadenopathy, likely reactive. The heart is normal in size. No pericardial effusion. Coronary atherosclerosis. Atherosclerotic calcifications of the aortic arch. 4.4 cm ascending thoracic aortic aneurysm (series 2/image 30), unchanged. Prior left chest wall hematoma has resolved.  Preoperative labs noted.  Cr 0.87, H/H 15.8/47.7, PLT 302.    Patient continues to improve and regain strength since his hospitalization earlier this year.  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  Unfortunately, he is smoking again.  He denies chest pain, SOB, and significant DOE.  He is able to split wood and use a chain saw.  He is able to lie flat if needed, but primarily lies on his left side from comfort.  He is eating without significant difficulty but has to tuck his chin with swallowing.  Exam shows a pleasant Caucasian male in NAD.  Heart RRR, no murmur noted.  Lungs clear.  No significant LE edema noted.  Patient has an extensive history, but appears to be doing well.  He denies any acute cardiopulmonary symptoms.  His H/H are WNL.  I did discuss the finding of TAA on his CT  scan so he is aware that he should follow-up with his PCP or Dr. Vassie Loll scheduling serial follow-up.  (I also faxed report and left a voicemail with Carolyne Littles nurse.) I have reviewed above history with anesthesiologist Dr.  Katrinka Blazing.  If no acute changes then it is anticipated that he can proceed as planned.    Velna Ochs [facility]/Anesthesiology Phone [phone] 02/24/2013 4:20 PM

## 2013-03-02 ENCOUNTER — Encounter (HOSPITAL_COMMUNITY): Admission: RE | Payer: Self-pay | Source: Ambulatory Visit

## 2013-03-02 ENCOUNTER — Ambulatory Visit (HOSPITAL_COMMUNITY): Admission: RE | Admit: 2013-03-02 | Payer: Medicaid Other | Source: Ambulatory Visit | Admitting: Oral Surgery

## 2013-03-02 SURGERY — MULTIPLE EXTRACTION WITH ALVEOLOPLASTY
Anesthesia: Choice | Site: Mouth | Laterality: Bilateral

## 2013-03-15 ENCOUNTER — Encounter: Payer: Self-pay | Admitting: General Surgery

## 2013-03-15 ENCOUNTER — Ambulatory Visit (INDEPENDENT_AMBULATORY_CARE_PROVIDER_SITE_OTHER): Payer: Medicaid Other | Admitting: Cardiology

## 2013-03-15 ENCOUNTER — Encounter: Payer: Self-pay | Admitting: Cardiology

## 2013-03-15 VITALS — BP 116/80 | HR 88 | Ht 70.5 in | Wt 194.0 lb

## 2013-03-15 DIAGNOSIS — I719 Aortic aneurysm of unspecified site, without rupture: Secondary | ICD-10-CM | POA: Insufficient documentation

## 2013-03-15 DIAGNOSIS — I10 Essential (primary) hypertension: Secondary | ICD-10-CM

## 2013-03-15 DIAGNOSIS — R0602 Shortness of breath: Secondary | ICD-10-CM

## 2013-03-15 DIAGNOSIS — Z01810 Encounter for preprocedural cardiovascular examination: Secondary | ICD-10-CM

## 2013-03-15 NOTE — Patient Instructions (Addendum)
Your physician recommends that you continue on your current medications as directed. Please refer to the Current Medication list given to you today.  Your physician has requested that you have a lexiscan myoview. For further information please visit [email or web address] Please follow instruction sheet, as given.  Your physician has requested that you have an echocardiogram. Echocardiography is a painless test that uses sound waves to create images of your heart. It provides your doctor with information about the size and shape of your heart and how well your heart's chambers and valves are working. This procedure takes approximately one hour. There are no restrictions for this procedure.   Your physician wants you to follow-up in: 1 Year with Dr Sherlyn Lick will receive a reminder letter in the mail two months in advance. If you don't receive a letter, please call our office to schedule the follow-up appointment.

## 2013-03-16 NOTE — Progress Notes (Signed)
[address] [place], [place]  [address] Phone: [phone] Fax:  [phone]  Date:  03/16/2013   ID:  Cody [name], DOB 1950-06-17, MRN [id number]  PCP:  Eartha Inch, MD  Cardiologist:  Armanda Magic, MD    History of Present Illness: Cody [name] is a 62 y.o. male with a history of tobacco abuse, dyslipidemia, COPD, DM, PE with IVC filter, hemochromatosis, HTN and 4.4cm thoracic aortic aneurysm who presents today for preoperative clearance to have his teeth pulled under general anesthesia.  He had a lengthy hospitalization in March 2014 for acute respiratory failure for influenza A PNA requiring mechanical ventilation and then trach.  This was complicated by PE and DVT for which he got an IVC filter.  He developed an empyema as well.  He was found at that time to have a thoracic aortic aneurysm for which CVTS surgery is folllowing.  Because of his multiple cardiac risk factors we are now asked to consult for preoperative clearance for teeth extraction under generaal anesthesia.  He has chronic SOB which he attributes to his COPD.  He denies any chest pain, LE edema, dizziness, palpitations or syncope.   Wt Readings from Last 3 Encounters:  03/15/13 194 lb (87.998 kg)  02/24/13 184 lb 4.8 oz (83.598 kg)  01/19/13 196 lb 3.2 oz (88.996 kg)     Past Medical History  Diagnosis Date  . Hyperlipidemia   . GERD (gastroesophageal reflux disease)   . COPD (chronic obstructive pulmonary disease)   . Shortness of breath   . Diabetes mellitus without complication   . Pulmonary embolism 05/2012    filter for blood clots  . Shoulder pain     left  . Oxygen dependent   . Anxiety     panic attacks  . Hemochromatosis   . Rectal bleed   . Pneumonia     H1N1- admitted in respiratory failure in ED- 04/2012   . Stroke     2007, R eye (lower vision - lost ) , carotid studies done recently- clear  . Bleeding nose     history- 1998?, hosp. for for 1 week, per pt.   .  Neuromuscular disorder     numbness in toes in R foot, " back" , spine neurology   . Arthritis     shoulders, neck   . Blood dyscrasia     response to heparin, protamine- see note in allergy category   . Thoracic aortic aneurysm     4.4 cm ascending thoracic aortic aneurysm by CT 09/15/12  . Erythrocytosis     felt related to smoking; requires periodic phlebotomy (HEM: Dr. Orion Modest)  . Hypertension     Current Outpatient Prescriptions  Medication Sig Dispense Refill  . amLODipine (NORVASC) 10 MG tablet Take 5 mg by mouth every morning. Take 0.5 tablets (5 mg total) by mouth daily.      . budesonide-formoterol (SYMBICORT) 160-4.5 MCG/ACT inhaler Inhale 2 puffs into the lungs 2 (two) times daily as needed      . budesonide-formoterol (SYMBICORT) 160-4.5 MCG/ACT inhaler 2 puffs.      . hydrochlorothiazide (HYDRODIURIL) 12.5 MG tablet Take 2 tablets (25 mg total) by mouth daily.  90 tablet  3  . ibuprofen (ADVIL,MOTRIN) 200 MG tablet Take 200 mg by mouth every 8 (eight) hours as needed.      Marland Kitchen losartan (COZAAR) 25 MG tablet Take 25 mg by mouth every morning.      Marland Kitchen  metFORMIN (GLUCOPHAGE-XR) 500 MG 24 hr tablet Take 500 mg by mouth daily with breakfast.      . sertraline (ZOLOFT) 50 MG tablet 50 mg.       No current facility-administered medications for this visit.    Allergies:    Allergies  Allergen Reactions  . Protamine Anaphylaxis  . Ace Inhibitors Cough  . Heparin Other (See Comments)    Bleeding, into muscles Bleeding, into muscles    Social History:  The patient  reports that he has been smoking Cigarettes.  He has a 50 pack-year smoking history. He has never used smokeless tobacco. He reports that he does not drink alcohol or use illicit drugs.   Family History:  The patient's family history includes COPD in his mother.   ROS:  Please see the history of present illness.      All other systems reviewed and negative.   PHYSICAL EXAM: VS:  BP 116/80  Pulse 88  Ht 5'  10.5" (1.791 m)  Wt 194 lb (87.998 kg)  BMI 27.43 kg/m2 Well nourished, well developed, in no acute distress HEENT: normal Neck: no JVD Cardiac:  normal S1, S2; RRR; no murmur Lungs:  clear to auscultation bilaterally, no wheezing, rhonchi or rales Abd: soft, nontender, no hepatomegaly Ext: no edema Skin: warm and dry Neuro:  CNs 2-12 intact, no focal abnormalities noted  EKG:     NSR with nonspecific T wave abnormality  ASSESSMENT AND PLAN:  1.  Thoracic aortic aneurysm  - check 2D echo to assess ascending aorta 2.  SOB most likely secondary to COPD but with multiple cardiac risk factors would recommend noninvasive stress test to rule out ischemia prior to general anesthesia for teeth extraction.    - I will get a Lexiscan myoview and 2D echo   Followup with me in 1 year to followup on aneurysm   Signed, Armanda Magic, MD 03/16/2013 3:36 PM

## 2013-03-17 ENCOUNTER — Telehealth: Payer: Self-pay | Admitting: Internal Medicine

## 2013-03-18 NOTE — Telephone Encounter (Signed)
Pt reports he needs to have dental work done and ran into problems with a AAA. He is in the process of having a work up starting next week. He asks if we can postpone this until next year. Informed him I will call in January to check on him, but Dr Rhea Belton would like to see him in the office before anything is scheduled. Mailed pt an appt letter for 05/05/13 at 10:00am

## 2013-03-24 ENCOUNTER — Encounter: Payer: Self-pay | Admitting: Cardiothoracic Surgery

## 2013-03-24 ENCOUNTER — Institutional Professional Consult (permissible substitution) (INDEPENDENT_AMBULATORY_CARE_PROVIDER_SITE_OTHER): Payer: Medicaid Other | Admitting: Cardiothoracic Surgery

## 2013-03-24 VITALS — BP 140/88 | HR 96 | Resp 20 | Ht 70.5 in | Wt 194.0 lb

## 2013-03-24 DIAGNOSIS — I719 Aortic aneurysm of unspecified site, without rupture: Secondary | ICD-10-CM

## 2013-03-24 NOTE — Progress Notes (Signed)
PCP is Eartha Inch, MD Referring Provider is Eartha Inch, MD  Chief Complaint  Patient presents with  . Thoracic Aortic Aneurysm    Surgical eval on ascending aortic aneurysm, last Chest CT 01/31/13 @ [facility]   patient examined, CT of the thoracic aorta in 2-D echocardiogram reviewed  HPI: The patient presents for evaluation of a recently diagnosed 4.4 cm fusiform ascending thoracic aortic aneurysm. The patient had several CT scans of the chest in early 2014 when he was admitted with influenzal pneumonia of the left lower lobe complicated by empyema requiring chest tube drainage,, respiratory insufficiency requiring tracheostomy and gastrostomy tube feeds, and pulmonary embolus requiring vena caval filter because of allergy to heparin. 4 Shiley he recovered. The patient was recommended to have his multiple necrotic and broken teeth removed to reduce his risk for respiratory infections and pneumonia. Cardiology clearance requested by anesthesia for general anesthesia to be used during the procedure. Echocardiogram shows mild elevation of the ascending aorta with continent aortic valve and good LV function.  Myocardial perfusion scan is pending.  CT chest performed earlier this fall demonstrates a 4.4 cm fusiform ascending thoracic aneurysm without ulceration,  hematoma or edema. He is asymptomatic for angina.  The patient's risk factors for aneurysm disease include active smoking approximately 1/2 pack per day and hypertension. The patient also has hemachromatosis and requires removal of unit of blood every 3 weeks. He is a reformed alcoholic  Past Medical History  Diagnosis Date  . Hyperlipidemia   . GERD (gastroesophageal reflux disease)   . COPD (chronic obstructive pulmonary disease)   . Shortness of breath   . Diabetes mellitus without complication   . Pulmonary embolism 05/2012    filter for blood clots  . Shoulder pain     left  . Oxygen dependent   . Anxiety    panic attacks  . Hemochromatosis   . Rectal bleed   . Pneumonia     H1N1- admitted in respiratory failure in ED- 04/2012   . Stroke     2007, R eye (lower vision - lost ) , carotid studies done recently- clear  . Bleeding nose     history- 1998?, hosp. for for 1 week, per pt.   . Neuromuscular disorder     numbness in toes in R foot, " back" , spine neurology   . Arthritis     shoulders, neck   . Blood dyscrasia     response to heparin, protamine- see note in allergy category   . Thoracic aortic aneurysm     4.4 cm ascending thoracic aortic aneurysm by CT 09/15/12  . Erythrocytosis     felt related to smoking; requires periodic phlebotomy (HEM: Dr. Orion Modest)  . Hypertension     Past Surgical History  Procedure Laterality Date  . Gastrostomy N/A 05/19/2012    Procedure: PEG Possible Open Gastrostomy;  Surgeon: Kandis Cocking, MD;  Location[facility] OR;  Service: General;  Laterality: N/A;  . Video bronchoscopy Bilateral 06/03/2012    Procedure: VIDEO BRONCHOSCOPY WITHOUT FLUORO;  Surgeon: [name], MD;  Location: [facility] ENDOSCOPY;  Service: Cardiopulmonary;  Laterality: Bilateral;  . Multiple tooth extractions    . Tracheostomy  2014    & closure, relative to pneumonia   . Insertion of vena cava filter      to prevent further pumonary embolis    Family History  Problem Relation Age of Onset  . COPD Mother     Social  History History  Substance Use Topics  . Smoking status: Current Every Day Smoker -- 1.00 packs/day for 50 years    Types: Cigarettes    Last Attempt to Quit: 12/13/2012  . Smokeless tobacco: Never Used     Comment: pt did quit back in march 2014-12/16/12  . Alcohol Use: No     Comment: recovering alcoholic- x 14 yrs., but still drinks on occasion - beer & wine     Current Outpatient Prescriptions  Medication Sig Dispense Refill  . amLODipine (NORVASC) 10 MG tablet Take 5 mg by mouth every morning. Take 0.5 tablets (5 mg total) by mouth daily.      .  budesonide-formoterol (SYMBICORT) 160-4.5 MCG/ACT inhaler Inhale 2 puffs into the lungs 2 (two) times daily as needed      . hydrochlorothiazide (HYDRODIURIL) 12.5 MG tablet Take 2 tablets (25 mg total) by mouth daily.  90 tablet  3  . ibuprofen (ADVIL,MOTRIN) 200 MG tablet Take 200 mg by mouth every 8 (eight) hours as needed.      Marland Kitchen losartan (COZAAR) 25 MG tablet Take 25 mg by mouth every morning.      . metFORMIN (GLUCOPHAGE-XR) 500 MG 24 hr tablet Take 500 mg by mouth daily with breakfast.      . sertraline (ZOLOFT) 50 MG tablet 50 mg.       No current facility-administered medications for this visit.    Allergies  Allergen Reactions  . Protamine Anaphylaxis  . Ace Inhibitors Cough  . Heparin Other (See Comments)    Bleeding, into muscles Bleeding, into muscles    Review of Systems no chest pain no family history of thoracic or abdominal aortic aneurysm disease or aortic dissection  BP 140/88  Pulse 96  Resp 20  Ht 5' 10.5" (1.791 m)  Wt 194 lb (87.998 kg)  BMI 27.43 kg/m2  SpO2 93% Physical Exam  General appearance-middle-aged Caucasian male smoker no acute distress- HEENT-necrotic broken teeth, pupils equal Neck-no JVD or bruit or adenopathy Thorax-healed tracheostomy, scattered rhonchi bilaterally Cardiac-regular rhythm without murmur or gallop Abdomen-nontender no pulsatile mass Extremities-mild clubbing but no cyanosis edema or tenderness Neuro-no focal motor deficit  Diagnostic Tests: CT scan of chest reviewed shows a 4.4 cm mild ascending fusiform aneurysm. The descending aorta measures 2.9 cm. The aorta appears to be smooth without significant atheromatous plaque or thickening  Impression: Small ascending fusiform aneurysm 4.4 cm. Risk of dissection is not significant until the diameter reaches 5.5 cm. Best treatment to prevent progression of this small aneurysm in smoking cessation blood pressure control which was clearly explained to the patient.  Plan:  Return for CT scan of chest in one year to assess fusiform aneurysm, mild at 4.4 cm  Patient is safe to proceed with dental extractions with respect to his thoracic aortic disease

## 2013-03-25 ENCOUNTER — Encounter (HOSPITAL_COMMUNITY): Admission: RE | Payer: Self-pay | Source: Ambulatory Visit

## 2013-03-25 ENCOUNTER — Telehealth: Payer: Self-pay | Admitting: Pulmonary Disease

## 2013-03-25 ENCOUNTER — Ambulatory Visit (HOSPITAL_COMMUNITY): Admission: RE | Admit: 2013-03-25 | Payer: Medicaid Other | Source: Ambulatory Visit | Admitting: Internal Medicine

## 2013-03-25 SURGERY — COLONOSCOPY WITH PROPOFOL
Anesthesia: Monitor Anesthesia Care

## 2013-03-25 NOTE — Telephone Encounter (Signed)
Recd records from [facility] Forwarding 8pgs to [facility]

## 2013-03-31 ENCOUNTER — Ambulatory Visit (HOSPITAL_COMMUNITY): Payer: Medicaid Other | Attending: Cardiology | Admitting: Radiology

## 2013-03-31 ENCOUNTER — Encounter: Payer: Self-pay | Admitting: Cardiology

## 2013-03-31 VITALS — BP 107/82 | HR 92 | Ht 70.5 in | Wt 198.0 lb

## 2013-03-31 DIAGNOSIS — I10 Essential (primary) hypertension: Secondary | ICD-10-CM | POA: Insufficient documentation

## 2013-03-31 DIAGNOSIS — Z01810 Encounter for preprocedural cardiovascular examination: Secondary | ICD-10-CM

## 2013-03-31 DIAGNOSIS — J449 Chronic obstructive pulmonary disease, unspecified: Secondary | ICD-10-CM | POA: Insufficient documentation

## 2013-03-31 DIAGNOSIS — F17200 Nicotine dependence, unspecified, uncomplicated: Secondary | ICD-10-CM | POA: Insufficient documentation

## 2013-03-31 DIAGNOSIS — I739 Peripheral vascular disease, unspecified: Secondary | ICD-10-CM | POA: Insufficient documentation

## 2013-03-31 DIAGNOSIS — E119 Type 2 diabetes mellitus without complications: Secondary | ICD-10-CM | POA: Insufficient documentation

## 2013-03-31 DIAGNOSIS — J4489 Other specified chronic obstructive pulmonary disease: Secondary | ICD-10-CM | POA: Insufficient documentation

## 2013-03-31 DIAGNOSIS — R0602 Shortness of breath: Secondary | ICD-10-CM | POA: Insufficient documentation

## 2013-03-31 MED ORDER — REGADENOSON 0.4 MG/5ML IV SOLN
0.4000 mg | Freq: Once | INTRAVENOUS | Status: AC
  Administered 2013-03-31: 0.4 mg via INTRAVENOUS

## 2013-03-31 MED ORDER — TECHNETIUM TC 99M SESTAMIBI GENERIC - CARDIOLITE
11.0000 | Freq: Once | INTRAVENOUS | Status: AC | PRN
  Administered 2013-03-31: 11 via INTRAVENOUS

## 2013-03-31 MED ORDER — TECHNETIUM TC 99M SESTAMIBI GENERIC - CARDIOLITE
33.0000 | Freq: Once | INTRAVENOUS | Status: AC | PRN
  Administered 2013-03-31: 33 via INTRAVENOUS

## 2013-03-31 NOTE — Progress Notes (Signed)
[facility] SITE 3 NUCLEAR MED [address] [place], [place] [address] [phone]    Cardiology Nuclear Med Study  Cody [name] is a 62 y.o. male     MRN : [id number]     DOB: 12/21/1950  Procedure Date: 03/31/2013  Nuclear Med Background Indication for Stress Test:  Evaluation for Ischemia and Surgical Clearance  Dental Surgery- Dr. Benson Norway History:  No known CAD, Echo 2014 EF 50-55%, COPD Cardiac Risk Factors: CVA, Hypertension, Lipids, NIDDM, PVD and Smoker  Symptoms:  SOB   Nuclear Pre-Procedure Caffeine/Decaff Intake:  None NPO After: 7:00pm   Lungs:  clear O2 Sat: 92% on 2L 02 via nasal cannula IV 0.9% NS with Angio Cath:  22g  IV Site: R Hand  IV Started by:  Bonnita Levan, RN  Chest Size (in):  46 Cup Size: n/a  Height: 5' 10.5" (1.791 m)  Weight:  198 lb (89.812 kg)  BMI:  Body mass index is 28 kg/(m^2). Tech Comments:  N/A    Nuclear Med Study 1 or 2 day study: 1 day  Stress Test Type:  Lexiscan  Reading MD: Armanda Magic, MD  Order Authorizing Provider:  Armanda Magic, MD  Resting Radionuclide: Technetium 93m Sestamibi  Resting Radionuclide Dose: 11.0 mCi   Stress Radionuclide:  Technetium 43m Sestamibi  Stress Radionuclide Dose: 33.0 mCi           Stress Protocol Rest HR: 92 Stress HR: 108  Rest BP: 107/82 Stress BP: 129/94  Exercise Time (min): n/a METS: n/a   Predicted Max HR: 156 bpm % Max HR: 69.23 bpm Rate Pressure Product: 21308   Dose of Adenosine (mg):  n/a Dose of Lexiscan: 0.4 mg  Dose of Atropine (mg): n/a Dose of Dobutamine: n/a mcg/kg/min (at max HR)  Stress Test Technologist: Nelson Chimes, BS-ES  Nuclear Technologist:  Domenic Polite, CNMT     Rest Procedure:  Myocardial perfusion imaging was performed at rest 45 minutes following the intravenous administration of Technetium 39m Sestamibi. Rest ECG: NSR - Normal EKG  Stress Procedure:  The patient received IV Lexiscan 0.4 mg over 15-seconds.  Technetium 5m  Sestamibi injected at 30-seconds.  Quantitative spect images were obtained after a 45 minute delay.  During the infusion of Lexiscan, the patient complained of a warm sensation, flushed feeling, SOB and dizziness.  Symptoms began to resolve in recovery.  Stress ECG: No significant change from baseline ECG  QPS Raw Data Images:  Mild diaphragmatic attenuation.  Normal left ventricular size. Stress Images:  There is decreased uptake in the inferior wall. Rest Images:  There is decreased uptake in the inferior wall. Subtraction (SDS):  No evidence of ischemia. Transient Ischemic Dilatation (Normal <1.22):  0.99 Lung/Heart Ratio (Normal <0.45):  0.22  Quantitative Gated Spect Images QGS EDV:  105 ml QGS ESV:  31 ml  Impression Exercise Capacity:  Lexiscan with no exercise. BP Response:  Normal blood pressure response. Clinical Symptoms:  There is dyspnea. ECG Impression:  No significant ST segment change suggestive of ischemia. Comparison with Prior Nuclear Study: No images to compare  Overall Impression:  Low risk stress nuclear study . No clear evidence of ischemia..  LV Ejection Fraction: 71%.  LV Wall Motion:  NL LV Function; NL Wall Motion  Corky Crafts., MD, [facility]

## 2013-04-02 ENCOUNTER — Encounter: Payer: Self-pay | Admitting: Cardiology

## 2013-04-02 ENCOUNTER — Ambulatory Visit (HOSPITAL_COMMUNITY): Payer: Medicaid Other | Attending: Cardiology | Admitting: Radiology

## 2013-04-02 DIAGNOSIS — I359 Nonrheumatic aortic valve disorder, unspecified: Secondary | ICD-10-CM | POA: Insufficient documentation

## 2013-04-02 DIAGNOSIS — Z8673 Personal history of transient ischemic attack (TIA), and cerebral infarction without residual deficits: Secondary | ICD-10-CM | POA: Insufficient documentation

## 2013-04-02 DIAGNOSIS — J4489 Other specified chronic obstructive pulmonary disease: Secondary | ICD-10-CM | POA: Insufficient documentation

## 2013-04-02 DIAGNOSIS — J449 Chronic obstructive pulmonary disease, unspecified: Secondary | ICD-10-CM | POA: Insufficient documentation

## 2013-04-02 DIAGNOSIS — I719 Aortic aneurysm of unspecified site, without rupture: Secondary | ICD-10-CM

## 2013-04-02 DIAGNOSIS — I059 Rheumatic mitral valve disease, unspecified: Secondary | ICD-10-CM | POA: Insufficient documentation

## 2013-04-02 DIAGNOSIS — R0602 Shortness of breath: Secondary | ICD-10-CM | POA: Insufficient documentation

## 2013-04-02 DIAGNOSIS — I10 Essential (primary) hypertension: Secondary | ICD-10-CM | POA: Insufficient documentation

## 2013-04-02 DIAGNOSIS — Z01810 Encounter for preprocedural cardiovascular examination: Secondary | ICD-10-CM

## 2013-04-02 DIAGNOSIS — E785 Hyperlipidemia, unspecified: Secondary | ICD-10-CM | POA: Insufficient documentation

## 2013-04-02 DIAGNOSIS — F17200 Nicotine dependence, unspecified, uncomplicated: Secondary | ICD-10-CM | POA: Insufficient documentation

## 2013-04-02 NOTE — Progress Notes (Signed)
Echocardiogram performed.  

## 2013-05-03 ENCOUNTER — Ambulatory Visit: Payer: Medicaid Other | Admitting: Internal Medicine

## 2013-10-14 ENCOUNTER — Telehealth: Payer: Self-pay | Admitting: Internal Medicine

## 2013-10-14 NOTE — Telephone Encounter (Signed)
Fax has been received and placed in MR look at. Cody [name] is aware of this as well. Advised her once MR reviews this and decides next step we will call her. Please advise MR thanks

## 2013-10-14 NOTE — Telephone Encounter (Signed)
Called spoke with Sara-PA from [facility]. She reports pt had repeat CT scan done at [facility]. She is concerned about the report. It stated the nodules are stable but slightly enlarged. She is aware MR is not back in the office until Monday. The # listed above is Sara's cell phone #. She would like a call once MR looks at the CT scan with what the plan will be. She did schedule pt an appt to come in and see MR 11/23/13 She is faxing the results over to triage fax. She would like a call once this has been received to ensure we did get the report. Will await fax

## 2013-10-14 NOTE — Telephone Encounter (Signed)
D/w Clarise Cruz  1. She has encouraged this non compliant patient not to miss his appts with [place]. He will see me 11/23/13  2. The nodules summer 2015 compared to a year ago are the same (all very small) buyt maybe a bit bigger; the report apparently is on my desk at this time. BBased on this description patuent needs repeat ct chest in 1 yer. I have told her we will moving forward do it a [place]. She is ok with it  3. Copd: will manage if he shows up 11/23/13  Dr. Brand Males, M.D., [facility].C.P Pulmonary and Critical Care Medicine Staff Physician [facility] Pager: [phone], If no answer or between  15:00h - 7:00h: call [phone]  10/14/2013 6:17 PM

## 2013-10-29 IMAGING — CR DG CHEST 1V PORT
1 series · 1 of 1 positions shown · non-contrast
Comparison: 05/02/2012

CLINICAL DATA: Respiratory failure and pulmonary embolism.

PORTABLE CHEST - 1 VIEW

[AP]
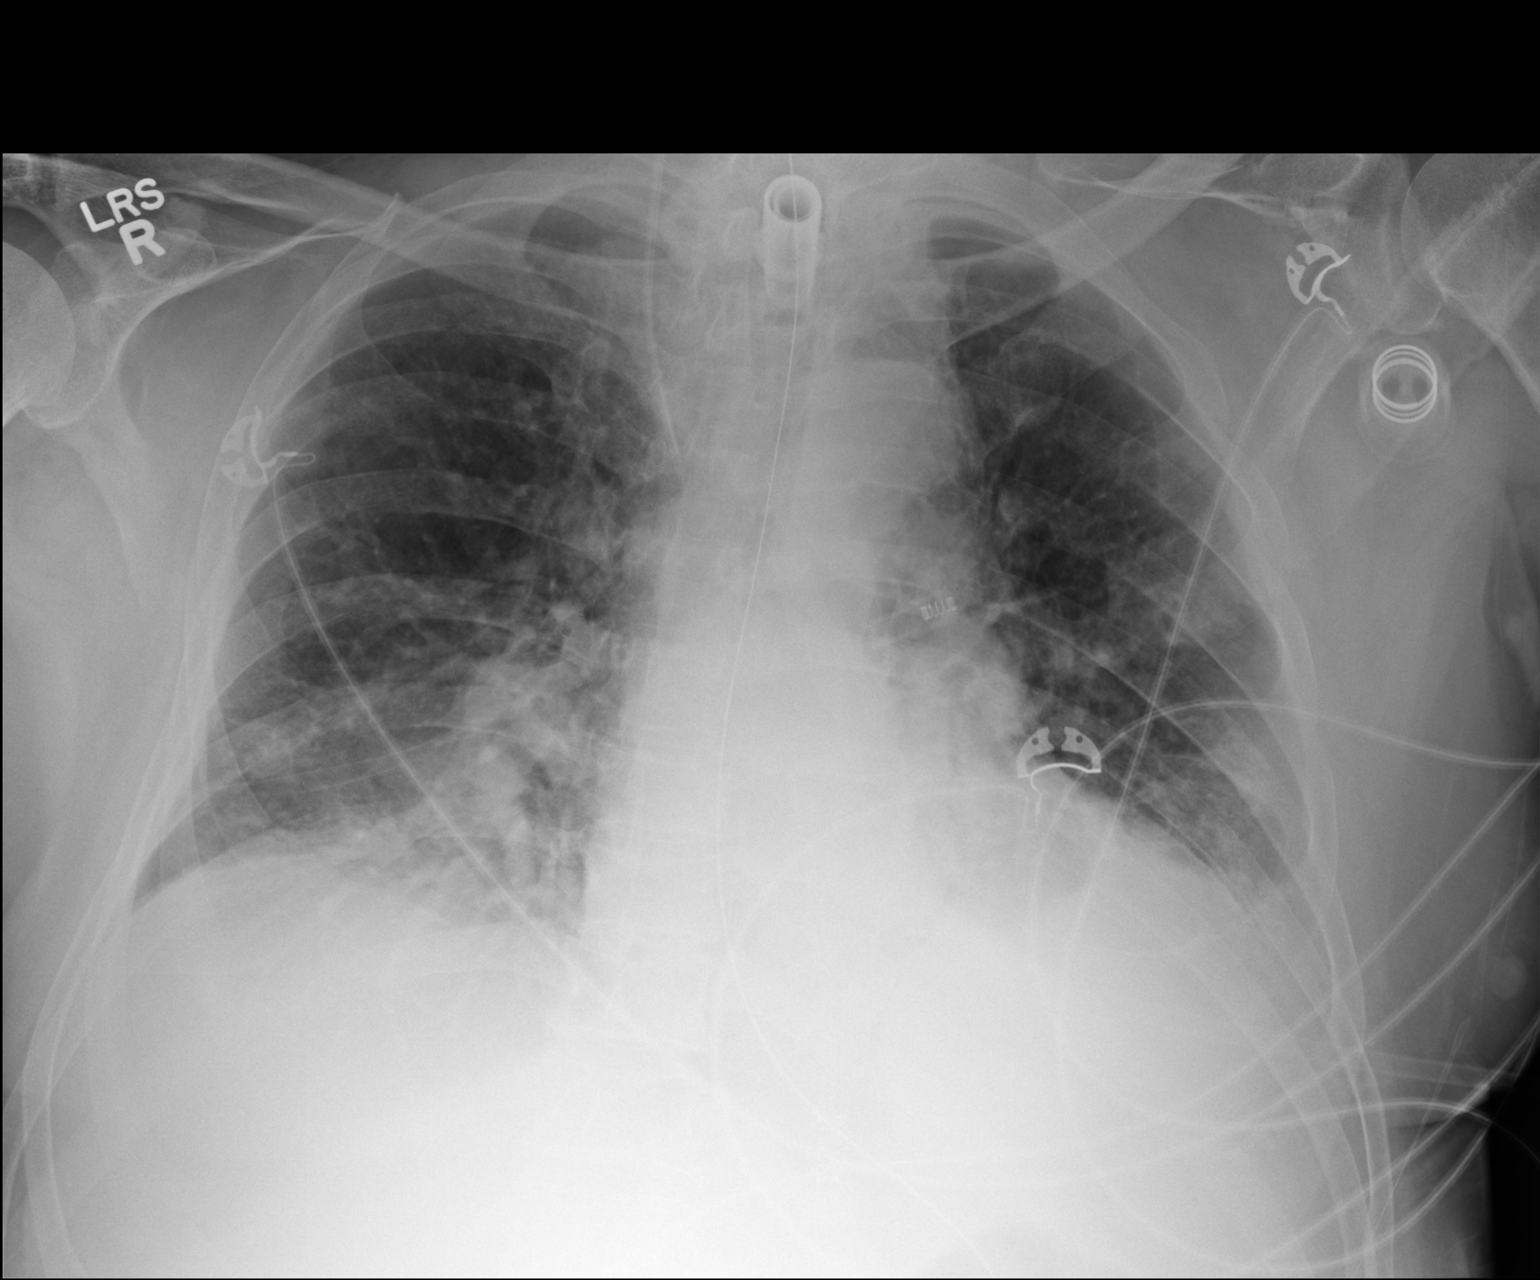

[1 of 1 positions shown; findings below may reference images not displayed]

FINDINGS: Tracheostomy positioning stable.  Central line tip stable
in the SVC.  Lungs show some increase in bibasilar
atelectasis/infiltrates since the prior study.  There also likely
is some increase in volume of a left pleural effusion.  No overt
pulmonary edema identified.  Heart size is stable.
IMPRESSION: Increased bibasilar atelectasis/infiltrates and probable increase
in left pleural fluid.

## 2013-10-30 IMAGING — CR DG ABD PORTABLE 2V
3 series · 3 of 3 positions shown · non-contrast
Comparison: 05/03/2012; 05/01/2012; chest radiograph - 05/05/2012;
05/02/2012

CLINICAL DATA: Ileus, bowel distension

PORTABLE ABDOMEN - 2 VIEW

[AP (1 of 3)]
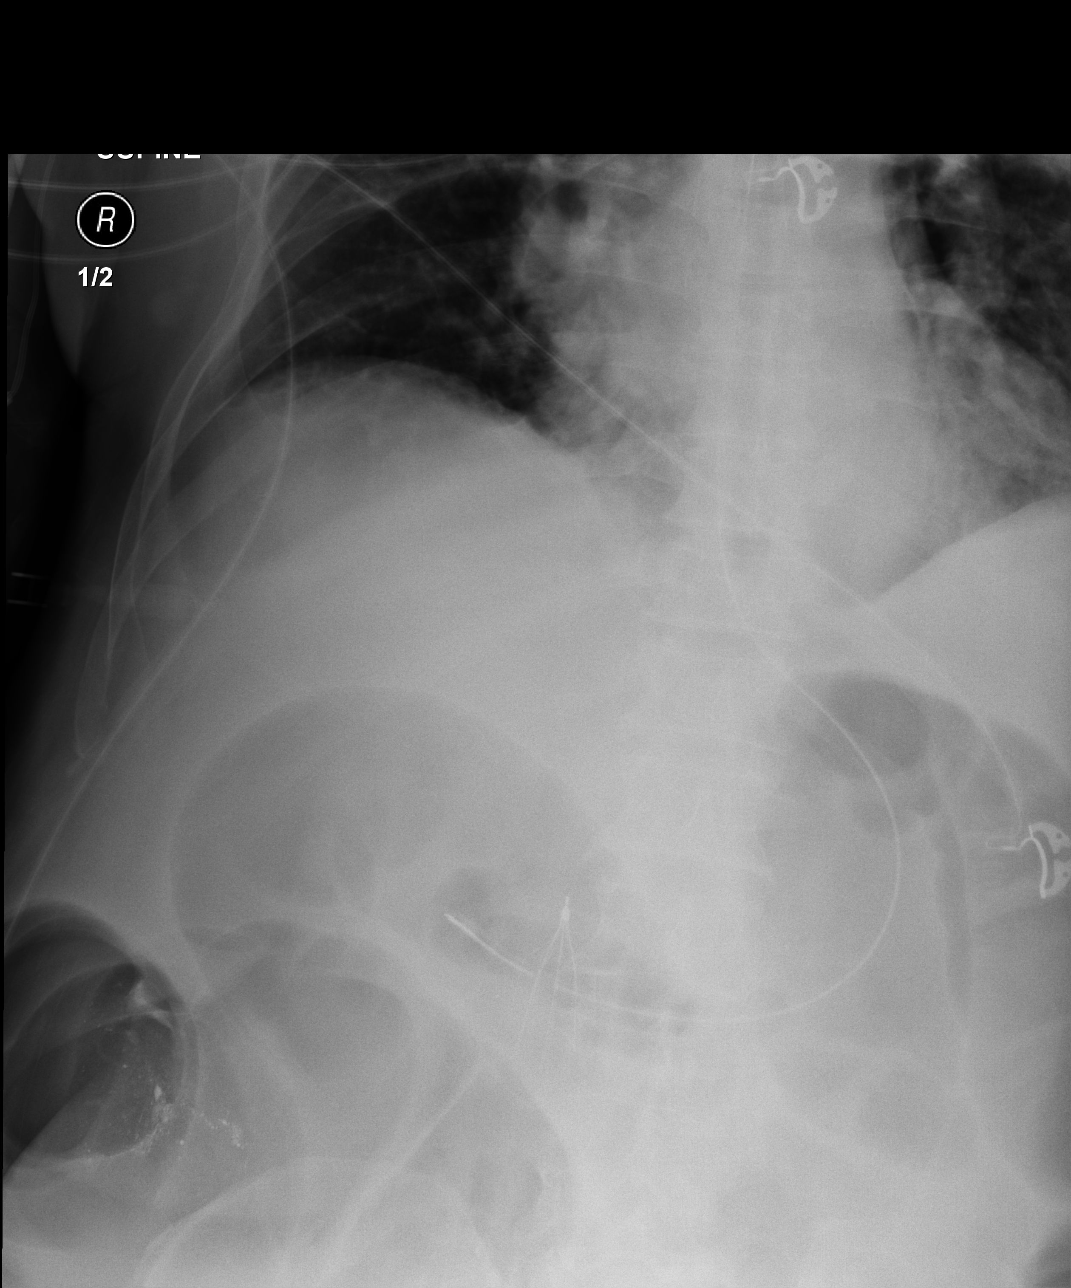

[AP (2 of 3)]
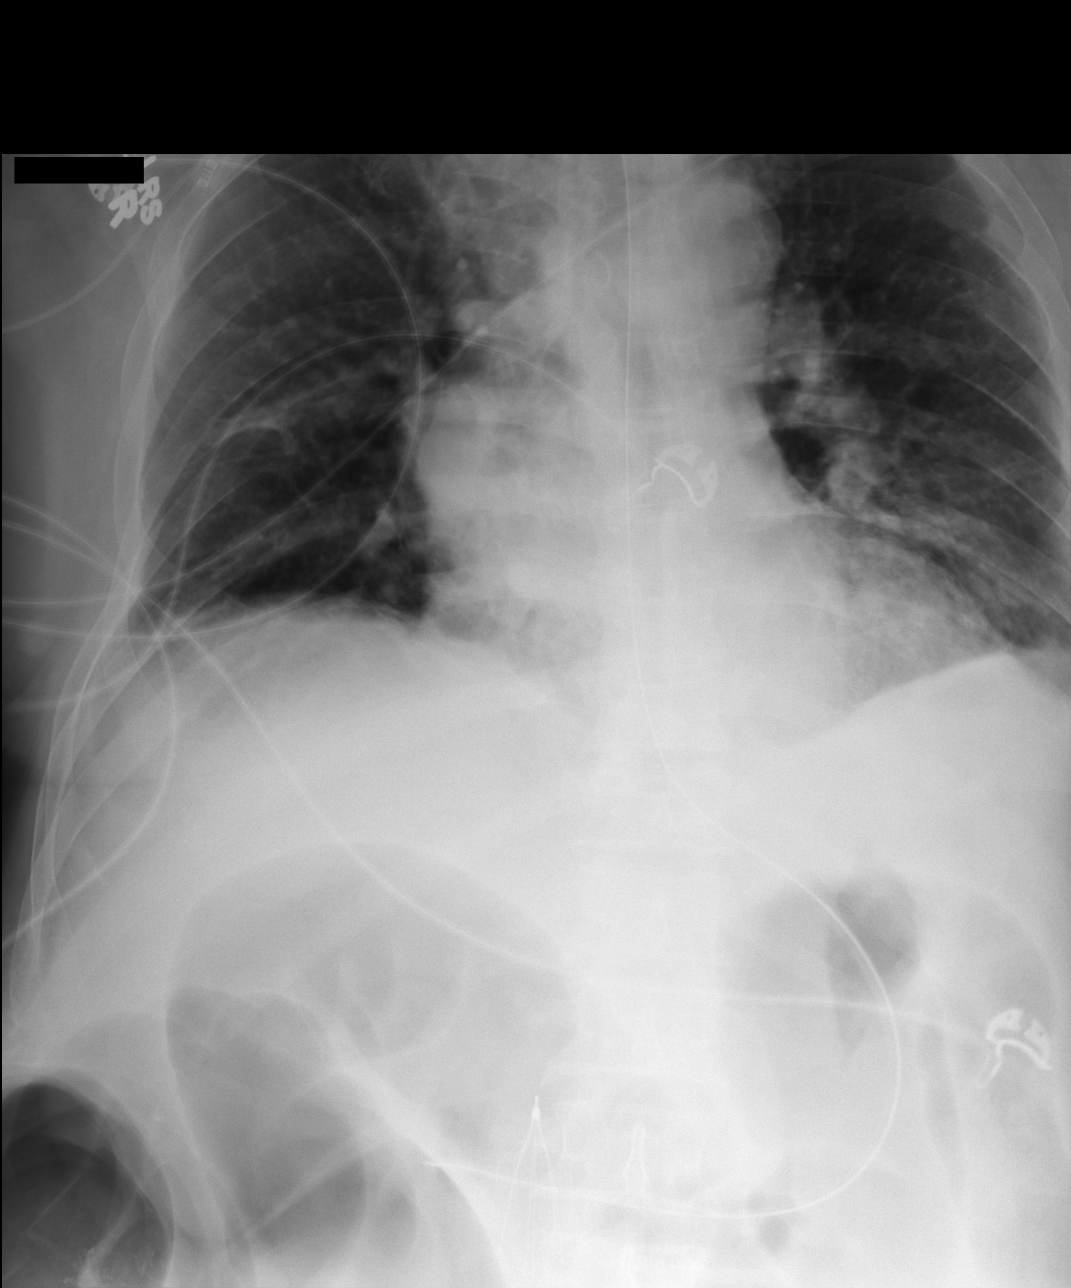

[AP (3 of 3)]
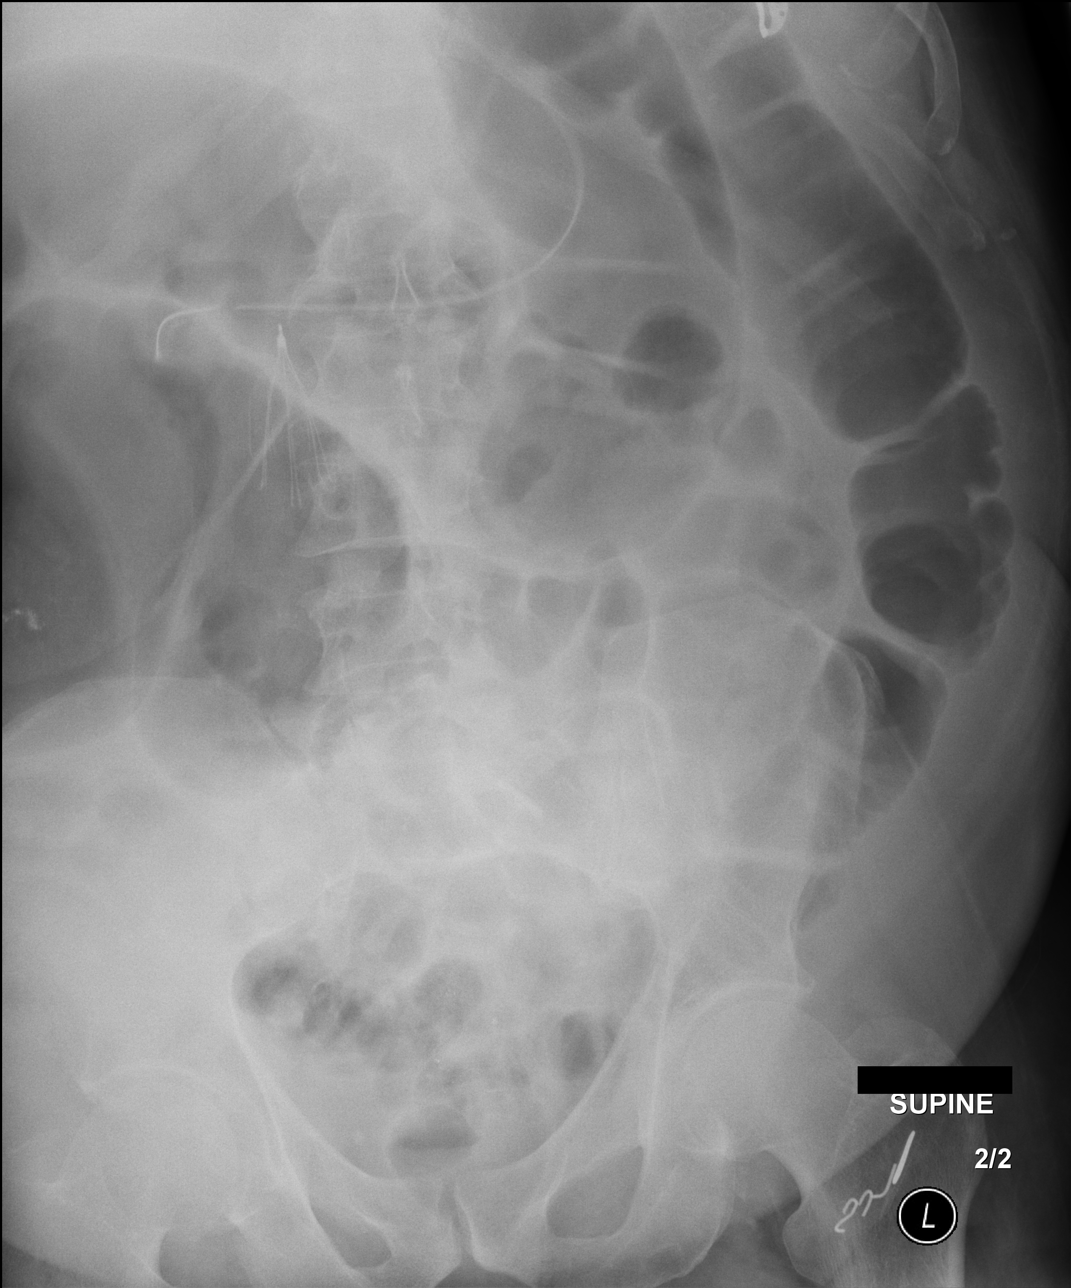

[3 of 3 positions shown; findings below may reference images not displayed]

FINDINGS: Interval passage of previously noted enteric contrast.  Grossly
unchanged moderate gaseous distension of the colon with index loop
of transverse colon measuring 7.4 cm in diameter.  No definite
gaseous distension of the small bowel.  No definite
pneumoperitoneum, pneumatosis or portal venous gas.

Limited visualization of the lower thorax demonstrates grossly
unchanged enlarged cardiac silhouette and mediastinal contours with
nodularity of the bilateral pulmonary hila, right greater than
left.  Tracheostomy tube overlies the superior aspect of the
tracheal air column.  Interval removal of right jugular approach
central venous catheter.

Enteric tube tip and side port project over the central location of
the stomach.  Post IVC filter placement.

No definite acute osseous abnormalities.
IMPRESSION: Grossly unchanged moderate gaseous distension of the colon likely
secondary to adynamic ileus/Caver syndrome.

## 2013-11-23 ENCOUNTER — Ambulatory Visit (INDEPENDENT_AMBULATORY_CARE_PROVIDER_SITE_OTHER): Payer: Medicaid Other | Admitting: Internal Medicine

## 2013-11-23 ENCOUNTER — Encounter: Payer: Self-pay | Admitting: Internal Medicine

## 2013-11-23 VITALS — BP 158/98 | HR 82 | Ht 70.0 in | Wt 217.0 lb

## 2013-11-23 DIAGNOSIS — J9611 Chronic respiratory failure with hypoxia: Secondary | ICD-10-CM | POA: Insufficient documentation

## 2013-11-23 DIAGNOSIS — J9610 Chronic respiratory failure, unspecified whether with hypoxia or hypercapnia: Secondary | ICD-10-CM

## 2013-11-23 DIAGNOSIS — J449 Chronic obstructive pulmonary disease, unspecified: Secondary | ICD-10-CM

## 2013-11-23 DIAGNOSIS — R0902 Hypoxemia: Secondary | ICD-10-CM

## 2013-11-23 DIAGNOSIS — F17200 Nicotine dependence, unspecified, uncomplicated: Secondary | ICD-10-CM

## 2013-11-23 DIAGNOSIS — R918 Other nonspecific abnormal finding of lung field: Secondary | ICD-10-CM

## 2013-11-23 DIAGNOSIS — R911 Solitary pulmonary nodule: Secondary | ICD-10-CM | POA: Insufficient documentation

## 2013-11-23 MED ORDER — TIOTROPIUM BROMIDE MONOHYDRATE 18 MCG IN CAPS: 18.0000 ug | ORAL_CAPSULE | Freq: Every day | RESPIRATORY_TRACT | Status: DC

## 2013-11-23 MED ORDER — FLUTICASONE PROPIONATE HFA 44 MCG/ACT IN AERO: 2.0000 | INHALATION_SPRAY | Freq: Two times a day (BID) | RESPIRATORY_TRACT | Status: DC

## 2013-11-23 NOTE — Patient Instructions (Signed)
#  COPD and chronic respiratory failure  - symptoms are stable and not suggestive of copd exacerbation  -Please start spiriva 1 puff daily - take sample, script and show technique - Start flovent 29mcg 2 puff twice daily  - use albuterol as needed  - meet [facility] for portable o2 system ; otherwise use 4L [place] continuous - keep pulse ox > 88% - flu shot in fall  #SMoking  - stay quit with smoking forever  #Lung nodules  - ct chest wo contrast June 2016  #FOllowup  2 months with NP Tammy

## 2013-11-23 NOTE — Progress Notes (Signed)
Subjective:    Patient ID: Cody [name], male    DOB: 04-Mar-1951, 63 y.o.   MRN: [id number]  HPI  PCP- Roe Coombs, Osborne Oman  (PA) 63 year old smoker for followup of COPD, hypoxia and DVT/PE.   #Admitted 04/22/2012 through 05/29/2012  - Influenza A, MEtapneumoviris and H. Influenzae related acute respiratory failure resulting in prolonged mechanical ventilation and status post tracheostomy 85/46/2703  - Course complicated by  - Delirium - resolved  - A Fib - resolved 05/09/12  - Acute Kidnye Injury - resolved  - Enterobacter UTI 05/14/12 - resolved  - Left-sided empyema status post left-sided pigtail catheter  - Pulmonary embolism with bilateral DVT 05/02/12 and 05/03/12: Rx with IV heparin/coumadin but then needing retrievable IVC filterin addition 05/04/2012 Due to MOBILE DVT & bleeding from tracheostomy site - s/p PEG tube 05/15/12  - deconditioning with transfer to inpatient pulmonary rehabilitation    #Admitted 06/03/2012 through 06/18/2012  - Acute and chronic critical illness . Methicillin sensitive staphylococcal aureus pneumonia and associated left-sided empyema with cavitary pneumonia as of 06/09/12 CT chest (new finding) and s/p Catheter placement 06/11/12 under CT guidance  - Course complicated by  - 9 cm chest wall hematoma 06/16/12 and dc of IV heparin  - Removal of PEG tube  - Deconditioning requiring inpatient rehabilitation   ..................................... 12/16/2012 47m FU  He was lost to followup for 5 months and is now back with worsening hypoxia -[facility]  Oxygen saturation was 76% on arrival are improved to 90% on 2 L nasal cannula CXR  12/15/12- no pna  He admits to slipping up and Started smoking again - glue allergy from patch, chantix makes him 'dumb', trying gum  He is not on anticoagulation and has his IVC filter.  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 Follow-up    Pt states his PCP Carola Rhine, PA, with [facility] suggested pt to come in for f/u. Pt c/o superficial tingling throughout his body and DOE. Pt denies CP/tightness.    Followup COPD with chronic respiratory failure: We have not seen him in the past one year. He has always followed with his primary physician. He is finally showed up in the pulmonary office. 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 He uses 4 L of oxygen continuous. He is not taking any inhalers or nebulizers. He is fairly symptomatic with shortness of breath at class III levels. He does not feel that his quality of life is good. He is worried that he will be disabled. He wants to work. He is also upset that he is "dependent" on oxygen. He does not have a portable oxygen system and wishes 1 but I am letting from my coordinator that because of his Medicaid status he cannot get one He tried Symbicort once and it made him very tachycardic. He is Spiriva with him but has never tried it. He is open to trying new inhalers or nebulizers as advised. He also expresses financial struggles.   Smoking: He relapsed and is smoking but apparently one week agoquit . He feels able to smoking forever at this time  Lung nodules: All his CT scans of the  chest has been in the outside facility. In discussing with his physician assistant I lung that these are stable in the neck CT scan is required in the summer of 2016.Marland Kitchen   Review of Systems  Constitutional: Negative for fever and unexpected weight change.  HENT: Negative for congestion, dental problem, ear pain, nosebleeds, postnasal drip, rhinorrhea, sinus pressure, sneezing, sore throat and trouble swallowing.   Eyes: Negative for redness and itching.  Respiratory: Positive for cough and  shortness of breath. Negative for chest tightness and wheezing.   Cardiovascular: Negative for palpitations and leg swelling.  Gastrointestinal: Negative for nausea and vomiting.  Genitourinary: Negative for dysuria.  Musculoskeletal: Negative for joint swelling.  Skin: Negative for rash.  Neurological: Negative for headaches.  Hematological: Does not bruise/bleed easily.  Psychiatric/Behavioral: Negative for dysphoric mood. The patient is not nervous/anxious.        Objective:   Physical Exam  Nursing note and vitals reviewed. Constitutional: He is oriented to person, place, and time. He appears well-developed and well-nourished. No distress.  Chronic unwell looking  HENT:  Head: Normocephalic and atraumatic.  Right Ear: External ear normal.  Left Ear: External ear normal.  Mouth/Throat: Oropharynx is clear and moist. No oropharyngeal exudate.  Eyes: Conjunctivae and EOM are normal. Pupils are equal, round, and reactive to light. Right eye exhibits no discharge. Left eye exhibits no discharge. No scleral icterus.  Neck: Normal range of motion. Neck supple. No JVD present. No tracheal deviation present. No thyromegaly present.  Cardiovascular: Normal rate, regular rhythm and intact distal pulses.  Exam reveals no gallop and no friction rub.   No murmur heard. Pulmonary/Chest: Effort normal and breath sounds normal. No respiratory distress. He has no wheezes. He has no rales. He exhibits no tenderness.  Barrel chest no wheeze overall diminished air entry  Abdominal: Soft. Bowel sounds are normal. He exhibits no distension and no mass. There is no tenderness. There is no rebound and no guarding.  Visceral obesity  Musculoskeletal: Normal range of motion. He exhibits no edema and no tenderness.  Lymphadenopathy:    He has no cervical adenopathy.  Neurological: He is alert and oriented to person, place, and time. He has normal reflexes. No cranial nerve deficit. Coordination normal.    Skin: Skin is warm and dry. No rash noted. He is not diaphoretic. No erythema. No pallor.  Psychiatric: He has a normal mood and affect. His behavior is normal. Judgment and thought content normal.  Flat affect    Filed Vitals:   11/23/13 0919  BP: 158/98  Pulse: 82  Height: 5\' 10"  (1.778 m)  Weight: 217 lb (98.431 kg)  SpO2: 94%         Assessment & Plan:  #COPD and chronic respiratory failure  - symptoms are stable and not suggestive of copd exacerbation  -Please start spiriva 1 puff daily - take sample, script and show technique - Start flovent 11mcg 2 puff twice daily  - use albuterol as needed  - meet [facility] for portable o2 system ; otherwise use 4L [place] continuous - keep pulse ox > 88% - flu shot in fall  #SMoking  - stay quit with smoking forever  #Lung nodules  - ct chest wo contrast June 2016  #FOllowup  2 months with NP Tammy

## 2013-11-28 ENCOUNTER — Telehealth: Payer: Self-pay | Admitting: Internal Medicine

## 2013-11-28 ENCOUNTER — Encounter: Payer: Self-pay | Admitting: Internal Medicine

## 2013-11-28 NOTE — Assessment & Plan Note (Signed)
#  COPD and chronic respiratory failure  - symptoms are stable and not suggestive of copd exacerbation  -Please start spiriva 1 puff daily - take sample, script and show technique - Start flovent 20mcg 2 puff twice daily  - use albuterol as needed  - meet [facility] for portable o2 system ; otherwise use 4L [place] continuous - keep pulse ox > 88% - flu shot in fall    #FOllowup  2 months with NP Tammy

## 2013-11-28 NOTE — Telephone Encounter (Signed)
[name]   Can you get hold of this patient Cody [name] 01-Nov-1950 for me please when I am in office. He has $ issues and one option would be refer him to a copd study. I would like to talk to him  Thanks  Dr. Brand Males, M.D., [facility].C.P Pulmonary and Critical Care Medicine Staff Physician [facility] Pager: [phone], If no answer or between  15:00h - 7:00h: call [phone]  11/28/2013 4:50 PM

## 2013-11-28 NOTE — Assessment & Plan Note (Signed)
#  Lung nodules  - ct chest wo contrast June 2016

## 2013-11-28 NOTE — Assessment & Plan Note (Signed)
#  SMoking  - stay quit with smoking forever

## 2013-11-29 NOTE — Telephone Encounter (Signed)
Called and spoke to pt. Informed pt that MR is wanting to speak to him regarding the COPD research study. Pt stated he could be best reached in his cell phone and around 4pm. Pt stated we could call at whatever is convenient for [place]. I informed him that we will try to call around 4 but it may be later. Pt verbalized understanding. Will call back for MR to speak with him.

## 2013-12-31 NOTE — Telephone Encounter (Signed)
Will discuss at fu

## 2013-12-31 NOTE — Telephone Encounter (Signed)
Per MR's request, will discuss COPD study at f/u. Pt has appt with TP on 10/19. Will forward message per request.

## 2014-01-13 ENCOUNTER — Encounter: Payer: Self-pay | Admitting: Cardiology

## 2014-01-24 ENCOUNTER — Encounter: Payer: Self-pay | Admitting: Adult Health

## 2014-01-24 ENCOUNTER — Ambulatory Visit (INDEPENDENT_AMBULATORY_CARE_PROVIDER_SITE_OTHER): Payer: Medicaid Other | Admitting: Adult Health

## 2014-01-24 VITALS — BP 126/78 | HR 94 | Temp 97.1°F | Ht 70.0 in | Wt 216.6 lb

## 2014-01-24 DIAGNOSIS — R918 Other nonspecific abnormal finding of lung field: Secondary | ICD-10-CM

## 2014-01-24 DIAGNOSIS — I2699 Other pulmonary embolism without acute cor pulmonale: Secondary | ICD-10-CM

## 2014-01-24 DIAGNOSIS — J9611 Chronic respiratory failure with hypoxia: Secondary | ICD-10-CM

## 2014-01-24 DIAGNOSIS — J449 Chronic obstructive pulmonary disease, unspecified: Secondary | ICD-10-CM

## 2014-01-24 NOTE — Assessment & Plan Note (Signed)
Continue on Oxygen   

## 2014-01-24 NOTE — Progress Notes (Signed)
Subjective:    Patient ID: Cody [name], male    DOB: December 25, 1950, 63 y.o.   MRN: [id number]  HPI  PCP- Roe Coombs, Osborne Oman  (PA) 63 year old smoker for followup of COPD, hypoxia and DVT/PE.   #Admitted 04/22/2012 through 05/29/2012  - Influenza A, MEtapneumoviris and H. Influenzae related acute respiratory failure resulting in prolonged mechanical ventilation and status post tracheostomy 11/94/1740  - Course complicated by  - Delirium - resolved  - A Fib - resolved 05/09/12  - Acute Kidnye Injury - resolved  - Enterobacter UTI 05/14/12 - resolved  - Left-sided empyema status post left-sided pigtail catheter  - Pulmonary embolism with bilateral DVT 05/02/12 and 05/03/12: Rx with IV heparin/coumadin but then needing retrievable IVC filterin addition 05/04/2012 Due to MOBILE DVT & bleeding from tracheostomy site - s/p PEG tube 05/15/12  - deconditioning with transfer to inpatient pulmonary rehabilitation    #Admitted 06/03/2012 through 06/18/2012  - Acute and chronic critical illness . Methicillin sensitive staphylococcal aureus pneumonia and associated left-sided empyema with cavitary pneumonia as of 06/09/12 CT chest (new finding) and s/p Catheter placement 06/11/12 under CT guidance  - Course complicated by  - 9 cm chest wall hematoma 06/16/12 and dc of IV heparin  - Removal of PEG tube  - Deconditioning requiring inpatient rehabilitation   ..................................... 12/16/2012 49m FU  He was lost to followup for 5 months and is now back with worsening hypoxia -[facility]  Oxygen saturation was 76% on arrival are improved to 90% on 2 L nasal cannula CXR  12/15/12- no pna  He admits to slipping up and Started smoking again - glue allergy from patch, chantix makes him 'dumb', trying gum  He is not on anticoagulation and has his IVC filter.  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 Follow-up    Pt states his PCP Carola Rhine, PA, with [facility] suggested pt to come in for f/u. Pt c/o superficial tingling throughout his body and DOE. Pt denies CP/tightness.    Followup COPD with chronic respiratory failure: We have not seen him in the past one year. He has always followed with his primary physician. He is finally showed up in the pulmonary office. 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 He uses 4 L of oxygen continuous. He is not taking any inhalers or nebulizers. He is fairly symptomatic with shortness of breath at class III levels. He does not feel that his quality of life is good. He is worried that he will be disabled. He wants to work. He is also upset that he is "dependent" on oxygen. He does not have a portable oxygen system and wishes 1 but I am letting from my coordinator that because of his Medicaid status he cannot get one He tried Symbicort once and it made him very tachycardic. He is Spiriva with him but has never tried it. He is open to trying new inhalers or nebulizers as advised. He also expresses financial struggles.   Smoking: He relapsed and is smoking but apparently one week agoquit . He feels able to smoking forever at this time  Lung nodules: All his CT scans of the  chest has been in the outside facility. In discussing with his physician assistant I lung that these are stable in the neck CT scan is required in the summer of 2016..   01/24/2014 Follow up COPD/O2 dependent  Pt returns for a 2 month COPD follow up.  Has noticed that his dyspnea is progressively worsening over last year. Does not feel rebounds as quickly.  No flare of cough or wheezing.  Taking Spiriva and Flovent . Could not tolerate Symbicort, caused jitteriness. .  Denies any  increased cough, wheezing, tightness, f/c/s, n/v/d, hemoptysis Is under a lot of stress, raising 3 grandchildren by himself for last 10 years. They are 16-19 yrs old.  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  Has not smoked since last ov, encouraged on cessation.    Review of Systems  Constitutional: Negative for fever and unexpected weight change.  HENT: Negative for congestion, dental problem, ear pain, nosebleeds, postnasal drip, rhinorrhea, sinus pressure, sneezing, sore throat and trouble swallowing.   Eyes: Negative for redness and itching.  Respiratory: Positive  shortness of breath. Negative for chest tightness and wheezing.   Cardiovascular: Negative for palpitations and leg swelling.  Gastrointestinal: Negative for nausea and vomiting.  Genitourinary: Negative for dysuria.  Musculoskeletal: Negative for joint swelling.  Skin: Negative for rash.  Neurological: Negative for headaches.  Hematological: Does not bruise/bleed easily.  Psychiatric/Behavioral: Negative for dysphoric mood. The patient is not nervous/anxious.        Objective:   Physical Exam  GEN: A/Ox3; pleasant , NAD, overweight  HEENT:  [place]/AT,  EACs-clear, TMs-wnl, NOSE-clear, THROAT-clear, no lesions, no postnasal drip or exudate noted.   NECK:  Supple w/ fair ROM; no JVD; normal carotid impulses w/o bruits; no thyromegaly or nodules palpated; no lymphadenopathy.  RESP  Decreased BS in bases .no accessory muscle use, no dullness to percussion  CARD:  RRR, no m/r/g  , no peripheral edema, pulses intact, no cyanosis or clubbing.  GI:   Soft & nt; nml bowel sounds; no organomegaly or masses detected.  Musco: Warm bil, no deformities or joint swelling noted.   Neuro: alert, no focal deficits noted.    Skin: Warm, no lesions or rashes

## 2014-01-24 NOTE — Patient Instructions (Signed)
#  COPD and chronic respiratory failure  Continue on Spiriva daily .   Continue on Flovent 25mcg 2 puff twice daily  Rinse after inhaler use.   Use albuterol as needed  Activity as tolerated   #SMoking  - stay quit with smoking forever-great job on not smoking, keep up good work .   #Lung nodules  - ct chest wo contrast June 2016  #FOllowup  3 months with Dr. Chase Caller and As needed    Please contact office for sooner follow up if symptoms do not improve or worsen or  seek emergency care

## 2014-01-24 NOTE — Assessment & Plan Note (Signed)
#  Lung nodules  - plan for ct chest wo contrast June 2016

## 2014-01-24 NOTE — Assessment & Plan Note (Signed)
Compensated on present regimen  Continue on current regimen  Vaccines utd.  Encouraged on smoking cessation

## 2014-02-14 ENCOUNTER — Other Ambulatory Visit: Payer: Self-pay | Admitting: *Deleted

## 2014-02-14 DIAGNOSIS — I719 Aortic aneurysm of unspecified site, without rupture: Secondary | ICD-10-CM

## 2014-03-22 ENCOUNTER — Other Ambulatory Visit: Payer: Self-pay | Admitting: *Deleted

## 2014-03-22 DIAGNOSIS — I719 Aortic aneurysm of unspecified site, without rupture: Secondary | ICD-10-CM

## 2014-03-22 DIAGNOSIS — R918 Other nonspecific abnormal finding of lung field: Secondary | ICD-10-CM

## 2014-03-22 DIAGNOSIS — J449 Chronic obstructive pulmonary disease, unspecified: Secondary | ICD-10-CM

## 2014-03-23 ENCOUNTER — Ambulatory Visit: Payer: Self-pay | Admitting: Cardiothoracic Surgery

## 2014-03-23 ENCOUNTER — Other Ambulatory Visit: Payer: Self-pay | Admitting: *Deleted

## 2014-03-23 ENCOUNTER — Inpatient Hospital Stay: Admission: RE | Admit: 2014-03-23 | Payer: Self-pay | Source: Ambulatory Visit

## 2014-03-23 DIAGNOSIS — I719 Aortic aneurysm of unspecified site, without rupture: Secondary | ICD-10-CM

## 2014-03-23 LAB — CREATININE, SERUM: Creat: 0.9 mg/dL (ref 0.50–1.35)

## 2014-03-23 LAB — BUN: BUN: 14 mg/dL (ref 6–23)

## 2014-03-25 ENCOUNTER — Ambulatory Visit (INDEPENDENT_AMBULATORY_CARE_PROVIDER_SITE_OTHER): Payer: MEDICAID | Admitting: Cardiothoracic Surgery

## 2014-03-25 ENCOUNTER — Ambulatory Visit
Admission: RE | Admit: 2014-03-25 | Discharge: 2014-03-25 | Disposition: A | Discharge status: Home / Self Care | Payer: MEDICAID | Source: Ambulatory Visit | Attending: Cardiothoracic Surgery | Admitting: Cardiothoracic Surgery

## 2014-03-25 ENCOUNTER — Encounter: Payer: Self-pay | Admitting: Cardiothoracic Surgery

## 2014-03-25 VITALS — BP 128/90 | HR 90 | Resp 20 | Ht 70.0 in | Wt 216.0 lb

## 2014-03-25 DIAGNOSIS — I719 Aortic aneurysm of unspecified site, without rupture: Secondary | ICD-10-CM

## 2014-03-25 DIAGNOSIS — I712 Thoracic aortic aneurysm, without rupture: Secondary | ICD-10-CM

## 2014-03-25 DIAGNOSIS — I7121 Aneurysm of the ascending aorta, without rupture: Secondary | ICD-10-CM

## 2014-03-25 MED ORDER — IOHEXOL 350 MG/ML SOLN
80.0000 mL | Freq: Once | INTRAVENOUS | Status: AC | PRN
  Administered 2014-03-25: 80 mL via INTRAVENOUS

## 2014-03-25 NOTE — Progress Notes (Signed)
PCP is Chesley Noon, MD Referring Provider is Chesley Noon, MD        [address]       [place] [address]             [phone]        Chief Complaint  Patient presents with  . 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  Patient denies any symptoms of chest pain. Patient has severe COPD on home oxygen and his lung disease his main limitation to his activities.  He has not had his chronic teeth removed yet but plans on doing so  He is currently off anticoagulation-she has history of DVT with pulmonary emboli, heparin allergy, and IVC filter. Of note he also has an allergy to protamine listed   Past Medical History  Diagnosis Date  . Hyperlipidemia   . GERD (gastroesophageal reflux disease)   . COPD (chronic obstructive pulmonary disease)   . Shortness of breath   . Diabetes mellitus without complication   . Pulmonary embolism 05/2012    filter for blood clots  . Shoulder pain     left  . Oxygen dependent   . Anxiety     panic attacks  . Hemochromatosis   . Rectal bleed   . Pneumonia     H1N1- admitted in respiratory failure in ED- 04/2012   . Stroke     2007, R eye (lower vision - lost ) , carotid studies done recently- clear  . Bleeding nose     history- 1998?, hosp. for for 1 week, per pt.   . Neuromuscular disorder     numbness in toes in R foot, " back" , spine neurology   . Arthritis     shoulders, neck   . Blood dyscrasia     response to heparin, protamine- see note in allergy category   . Thoracic aortic aneurysm     4.4 cm ascending thoracic aortic aneurysm by CT 09/15/12  . Erythrocytosis     felt related to smoking; requires periodic phlebotomy (HEM: Dr. Para March)    . Hypertension     Past Surgical History  Procedure Laterality Date  . Gastrostomy N/A 05/19/2012    Procedure: PEG Possible Open Gastrostomy;  Surgeon: Shann Medal, MD;  Location: [facility];  Service: General;  Laterality: N/A;  . Video bronchoscopy Bilateral 06/03/2012    Procedure: VIDEO BRONCHOSCOPY WITHOUT FLUORO;  Surgeon: Raylene Miyamoto, MD;  Location: [facility];  Service: Cardiopulmonary;  Laterality: Bilateral;  . Multiple tooth extractions    . Tracheostomy  2014    & closure, relative to pneumonia   . Insertion of vena cava filter      to prevent further pumonary embolis    Family History  Problem Relation Age of Onset  . COPD Mother     Social History History  Substance Use Topics  . Smoking status: Former Smoker -- 1.00 packs/day for 50 years    Types: Cigarettes    Quit date: 11/06/2013  . Smokeless tobacco: Never Used     Comment: pt did quit back in march 2014-12/16/12  . Alcohol Use: No     Comment: recovering alcoholic- x 14 yrs., but still drinks on occasion -  beer & wine     Current Outpatient Prescriptions  Medication Sig Dispense Refill  . amLODipine (NORVASC) 10 MG tablet Take 5 mg by mouth every morning. Take 0.5 tablets (5 mg total) by mouth daily.    . fluticasone (FLOVENT HFA) 44 MCG/ACT inhaler Inhale 2 puffs into the lungs 2 (two) times daily. 1 Inhaler 12  . hydrochlorothiazide (HYDRODIURIL) 12.5 MG tablet Take 2 tablets (25 mg total) by mouth daily. 90 tablet 3  . ibuprofen (ADVIL,MOTRIN) 200 MG tablet Take 200 mg by mouth every 8 (eight) hours as needed.    . metFORMIN (GLUCOPHAGE) 500 MG tablet Take by mouth 2 (two) times daily with a meal.    . ranitidine (ZANTAC) 150 MG tablet Take 150 mg by mouth 2 (two) times daily.    Marland Kitchen tiotropium (SPIRIVA) 18 MCG inhalation capsule Place 1 capsule (18 mcg total) into inhaler and inhale daily. 30 capsule 12   No current facility-administered medications for this visit.    Allergies  Allergen  Reactions  . Protamine Anaphylaxis  . Ace Inhibitors Cough  . Heparin Other (See Comments)    Bleeding, into muscles Bleeding, into muscles    Review of Systems  The patient is been gaining weight He is try to walk twice a day with his wife -- 15 minutes each  BP 128/90 mmHg  Pulse 90  Resp 20  Ht 5\' 10"  (1.778 m)  Wt 216 lb (97.977 kg)  BMI 30.99 kg/m2  SpO2 96% Physical Exam Alert and comfortable on nasal cannula 2 L Lungs with distant breath sounds but clear Heart rhythm regular, no murmur or gallop No pedal edema Well-healed tracheostomy scar, well-healed gastrostomy scar  Diagnostic Tests: CTA of the thoracic aorta reviewed showing no change in the fusiform ascending aneurysm which measures 4.5 cm. There is no evidence of intramural hematoma or penetrating ulcer. There are some calcifications of his coronaries. He has chronic small bilateral lung nodules unchanged  Impression: Stable fusiform ascending aneurysm measuring 4.5 cm first noted a year ago Patient is nonsmoking Patient has blood pressure controlled on medications  Plan:repeat CT of the thoracic aorta in approximately year-18 months

## 2014-04-28 ENCOUNTER — Ambulatory Visit: Payer: Self-pay | Admitting: Internal Medicine

## 2014-05-04 ENCOUNTER — Encounter: Payer: Self-pay | Admitting: Internal Medicine

## 2014-05-04 ENCOUNTER — Encounter (INDEPENDENT_AMBULATORY_CARE_PROVIDER_SITE_OTHER): Payer: Self-pay

## 2014-05-04 ENCOUNTER — Ambulatory Visit (INDEPENDENT_AMBULATORY_CARE_PROVIDER_SITE_OTHER): Payer: Medicaid Other | Admitting: Internal Medicine

## 2014-05-04 VITALS — BP 142/92 | HR 91 | Ht 70.0 in | Wt 223.0 lb

## 2014-05-04 DIAGNOSIS — J438 Other emphysema: Secondary | ICD-10-CM

## 2014-05-04 DIAGNOSIS — R918 Other nonspecific abnormal finding of lung field: Secondary | ICD-10-CM

## 2014-05-04 DIAGNOSIS — J9611 Chronic respiratory failure with hypoxia: Secondary | ICD-10-CM

## 2014-05-04 DIAGNOSIS — J449 Chronic obstructive pulmonary disease, unspecified: Secondary | ICD-10-CM

## 2014-05-04 DIAGNOSIS — Z720 Tobacco use: Secondary | ICD-10-CM

## 2014-05-04 DIAGNOSIS — F17200 Nicotine dependence, unspecified, uncomplicated: Secondary | ICD-10-CM

## 2014-05-04 NOTE — Progress Notes (Signed)
Subjective:    Patient ID: Cody [name], male    DOB: 08-26-1950, 64 y.o.   MRN: [id number]  HPI   #Admitted 04/22/2012 through 05/29/2012  - Influenza A, MEtapneumoviris and H. Influenzae related acute respiratory failure resulting in prolonged mechanical ventilation and status post tracheostomy 05/39/7673  - Course complicated by  - Delirium - resolved  - A Fib - resolved 05/09/12  - Acute Kidnye Injury - resolved  - Enterobacter UTI 05/14/12 - resolved  - Left-sided empyema status post left-sided pigtail catheter  - Pulmonary embolism with bilateral DVT 05/02/12 and 05/03/12: Rx with IV heparin/coumadin but then needing retrievable IVC filterin addition 05/04/2012 Due to MOBILE DVT & bleeding from tracheostomy site - s/p PEG tube 05/15/12  - deconditioning with transfer to inpatient pulmonary rehabilitation    #Admitted 06/03/2012 through 06/18/2012  - Acute and chronic critical illness . Methicillin sensitive staphylococcal aureus pneumonia and associated left-sided empyema with cavitary pneumonia as of 06/09/12 CT chest (new finding) and s/p Catheter placement 06/11/12 under CT guidance  - Course complicated by  - 9 cm chest wall hematoma 06/16/12 and dc of IV heparin  - Removal of PEG tube  - Deconditioning requiring inpatient rehabilitation   ..................................... 12/16/2012 75m FU  He was lost to followup for 5 months and is now back with worsening hypoxia -[facility]  Oxygen saturation was 76% on arrival are improved to 90% on 2 L nasal cannula CXR 12/15/12- no pna  He admits to slipping up and Started smoking again - glue allergy from patch, chantix makes him 'dumb', trying gum He is not on anticoagulation and has his IVC filter.  He has seen a hematologist for polycythemia He reports an episode of blood in stool but has refused colonoscopy He was working as a Dealer for [employer], [place] - [place]  11/23/2013  Chief Complaint  Patient presents with  . Follow-up    Pt states his PCP Carola Rhine, PA, with [facility] suggested pt to come in for f/u. Pt c/o superficial tingling throughout his body and DOE. Pt denies CP/tightness.    Followup COPD with chronic respiratory failure: We have not seen him in the past one year. He has always followed with his primary physician. He is finally showed up in the pulmonary office. 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 He uses 4 L of oxygen continuous. He is not taking any inhalers or nebulizers. He is fairly symptomatic with shortness of breath at class III levels. He does not feel that his quality of life is good. He is worried that he will be disabled. He wants to work. He is also upset that he is "dependent" on oxygen. He does not have a portable oxygen system and wishes 1 but I am letting from my coordinator that because of his Medicaid status he cannot get one He tried Symbicort once and it made him very tachycardic. He is Spiriva with him but has never tried it. He is open to trying new inhalers or nebulizers as advised. He also expresses financial struggles.   Smoking: He relapsed and is smoking but apparently one week agoquit . He feels able to smoking forever at this time  Lung nodules: All his CT scans of the chest has been in the outside facility. In discussing with his physician assistant I lung that these are  stable in the neck CT scan is required in the summer of 2016..   01/24/2014 Follow up COPD/O2 dependent  Pt returns for a 2 month COPD follow up.  Has noticed that his dyspnea is progressively worsening over last year. Does not feel rebounds as quickly.  No flare of cough or wheezing.  Taking Spiriva and Flovent . Could not tolerate Symbicort, caused jitteriness. .  Denies any increased cough, wheezing, tightness, f/c/s, n/v/d,  hemoptysis Is under a lot of stress, raising 3 grandchildren by himself for last 10 years. They are 16-19 yrs old.  Works part-time in Dealer business Flu/PVX / and [employer] utd.  Has not smoked since last ov, encouraged on cessation.     OV 05/04/2014 Chief Complaint  Patient presents with  . Follow-up    Pt stated his breathing is improved since last OV. Pt stated he only gets SOB with heavy exertion and without O2. Pt c/o cough with difficulty bringing mucus up-clear in color. Pt denies CP/tightness.      COPD : Follow-up for severe COPD with chronic respiratory failure. He is compliant with his oxygen and triple inhaler therapy. He is extremely functional. He continues to work as a Dealer. He finds that the oxygen use is disruptive to his quality-of-life. He describes that he has to travel to the oxygen company on a weekly basis and load up to 28 cylinders of oxygen and his car and stored in his car and his workplace. The oxygen cylinders interfere with his ability to be a Dealer. He wants to switch to switch to [facility] system which will allow him to continue to function as a Dealer and be less disruptive to his quality of life and be more productive with his income. He continues to support his teen grandchildren    SMoking:  reports that he quit smoking about 5 months ago. His smoking use included Cigarettes. He has a 50 pack-year smoking history. He has never used smokeless tobacco.   Lung nodules: Ct chest 03/25/14: Stable 4 mm nodule in the right upper lobe. This has been stable since 06/09/2012 and likely benign.   Immunization History  Administered Date(s) Administered  . Influenza Split 05/28/2012, 01/13/2014  . Pneumococcal Conjugate-13 01/13/2014  . Pneumococcal Polysaccharide-23 05/28/2012   Past medical and social history reviewed no new medical problems  Review of Systems  Constitutional: Negative for fever and unexpected weight change.  HENT: Negative for  congestion, dental problem, ear pain, nosebleeds, postnasal drip, rhinorrhea, sinus pressure, sneezing, sore throat and trouble swallowing.   Eyes: Negative for redness and itching.  Respiratory: Positive for cough. Negative for chest tightness, shortness of breath and wheezing.   Cardiovascular: Negative for palpitations and leg swelling.  Gastrointestinal: Negative for nausea and vomiting.  Genitourinary: Negative for dysuria.  Musculoskeletal: Negative for joint swelling.  Skin: Negative for rash.  Neurological: Negative for headaches.  Hematological: Does not bruise/bleed easily.  Psychiatric/Behavioral: Negative for dysphoric mood. The patient is not nervous/anxious.     Current outpatient prescriptions:  .  amLODipine (NORVASC) 10 MG tablet, Take 5 mg by mouth every morning. Take 0.5 tablets (5 mg total) by mouth daily., Disp: , Rfl:  .  hydrochlorothiazide (HYDRODIURIL) 12.5 MG tablet, Take 2 tablets (25 mg total) by mouth daily., Disp: 90 tablet, Rfl: 3 .  ibuprofen (ADVIL,MOTRIN) 200 MG tablet, Take 600 mg by mouth every 8 (eight) hours as needed. , Disp: , Rfl:  .  metFORMIN (GLUCOPHAGE) 500 MG tablet, Take by mouth  2 (two) times daily with a meal., Disp: , Rfl:  .  tiotropium (SPIRIVA) 18 MCG inhalation capsule, Place 1 capsule (18 mcg total) into inhaler and inhale daily., Disp: 30 capsule, Rfl: 12 .  valsartan (DIOVAN) 40 MG tablet, Take 40 mg by mouth daily., Disp: , Rfl:  .  fluticasone (FLOVENT HFA) 44 MCG/ACT inhaler, Inhale 2 puffs into the lungs 2 (two) times daily. (Patient not taking: Reported on 05/04/2014), Disp: 1 Inhaler, Rfl: 12     Objective:   Physical Exam  Constitutional: He is oriented to person, place, and time. He appears well-developed and well-nourished. No distress.  Visceral obesity +   HENT:  Head: Normocephalic and atraumatic.  Right Ear: External ear normal.  Left Ear: External ear normal.  Mouth/Throat: Oropharynx is clear and moist. No  oropharyngeal exudate.  Trach scar +  Eyes: Conjunctivae and EOM are normal. Pupils are equal, round, and reactive to light. Right eye exhibits no discharge. Left eye exhibits no discharge. No scleral icterus.  Neck: Normal range of motion. Neck supple. No JVD present. No tracheal deviation present. No thyromegaly present.  Cardiovascular: Normal rate, regular rhythm and intact distal pulses.  Exam reveals no gallop and no friction rub.   No murmur heard. Pulmonary/Chest: Effort normal and breath sounds normal. No respiratory distress. He has no wheezes. He has no rales. He exhibits no tenderness.  Abdominal: Soft. Bowel sounds are normal. He exhibits no distension and no mass. There is no tenderness. There is no rebound and no guarding.  Musculoskeletal: Normal range of motion. He exhibits no edema or tenderness.  Lymphadenopathy:    He has no cervical adenopathy.  Neurological: He is alert and oriented to person, place, and time. He has normal reflexes. No cranial nerve deficit. Coordination normal.  Skin: Skin is warm and dry. No rash noted. He is not diaphoretic. No erythema. No pallor.  Psychiatric: He has a normal mood and affect. His behavior is normal. Judgment and thought content normal.  Nursing note and vitals reviewed.    Filed Vitals:   05/04/14 0929  BP: 142/92  Pulse: 91  Height: 5\' 10"  (1.778 m)  Weight: 223 lb (101.152 kg)  SpO2: 97%        Assessment & Plan:     ICD-9-CM ICD-10-CM   1. Chronic respiratory failure with hypoxia 518.83 J96.11 AMB referral to rehabilitation   799.02  CT Chest Wo Contrast     CANCELED: Ambulatory Referral for DME  2. Other emphysema 492.8 J43.8 AMB referral to rehabilitation     CT Chest Wo Contrast     CANCELED: Ambulatory Referral for DME  3. Lung nodules 793.19 R91.8 AMB referral to rehabilitation     CT Chest Wo Contrast     CANCELED: Ambulatory Referral for DME  4. Chronic obstructive pulmonary disease, unspecified COPD,  unspecified chronic bronchitis type 496 J44.9 AMB referral to rehabilitation     CT Chest Wo Contrast     CANCELED: Ambulatory Referral for DME  5. Tobacco use disorder 305.1 Z72.0 AMB referral to rehabilitation     CT Chest Wo Contrast     CANCELED: Ambulatory Referral for DME    #COPD  - stable - glad uptodate with respiratory vaccines  - refer [facility] To get started on INNOGEN o2 system  - continue inhalers and o2  - refer pulmonary rehab for copd  #SMoking  - glad you quit  #Lung nodule - stable small nodule in dec 2015  -  repeat CT chest dec 2016   #Followup  6 months with NP Tammy   > 50% of this > 25 min visit spent in face to face counseling or coordination of care (15 min visit converted to 25 min)   Dr. Brand Males, M.D., [place].C.P Pulmonary and Critical Care Medicine Staff Physician [facility] Pager: [phone], If no answer or between  15:00h - 7:00h: call [phone]  05/04/2014 10:07 AM

## 2014-05-04 NOTE — Patient Instructions (Addendum)
ICD-9-CM ICD-10-CM   1. Chronic respiratory failure with hypoxia 518.83 J96.11    799.02    2. Other emphysema 492.8 J43.8   3. Lung nodules 793.19 R91.8   4. Chronic obstructive pulmonary disease, unspecified COPD, unspecified chronic bronchitis type 496 J44.9   5. Tobacco use disorder 305.1 Z72.0     #COPD  - stable - glad uptodate with respiratory vaccines  - refer [facility] To get started on INNOGEN o2 system  - continue inhalers and o2  - refer pulmonary rehab for copd  #SMoking  - glad you quit  #Lung nodule - stable small nodule in dec 2015  - repeat CT chest dec 2016   #Followup  6 months with NP Tammy

## 2014-05-24 ENCOUNTER — Telehealth: Payer: Self-pay | Admitting: Internal Medicine

## 2014-05-24 NOTE — Telephone Encounter (Signed)
Received fax from [place] with [facility]. Pt is unable to receive coverage for POC with medicaid insurance. Pt was offered OCD tanks but pt refused. Pt still using his previous tanks for daily O2. Pt has recall for appt in 10/2014.   Will send to MR for FYI.

## 2014-06-16 ENCOUNTER — Telehealth (HOSPITAL_COMMUNITY): Payer: Self-pay

## 2014-06-16 NOTE — Telephone Encounter (Signed)
Called patient regarding entrance to Pulmonary Rehab.  Patient states that they are interested in attending the program.  Unfortunately his insurance will not cover the cost of the program. He states he is fairly independent. Considering the self pay maintenance program. Will call back once he has discussed finances with significant other.

## 2014-07-04 ENCOUNTER — Telehealth (HOSPITAL_COMMUNITY): Payer: Self-pay

## 2014-07-04 NOTE — Telephone Encounter (Signed)
I have called and left a message with [facility] to inquire about participation in Pulmonary Rehab per Dr. Golden Pop referral. Will send letter in mail and follow up.

## 2014-11-22 ENCOUNTER — Encounter: Payer: Self-pay | Admitting: Internal Medicine

## 2014-11-22 ENCOUNTER — Ambulatory Visit (INDEPENDENT_AMBULATORY_CARE_PROVIDER_SITE_OTHER): Payer: Medicare Other | Admitting: Internal Medicine

## 2014-11-22 VITALS — BP 130/84 | HR 98 | Ht 70.0 in | Wt 228.0 lb

## 2014-11-22 DIAGNOSIS — J9611 Chronic respiratory failure with hypoxia: Secondary | ICD-10-CM | POA: Diagnosis not present

## 2014-11-22 DIAGNOSIS — J449 Chronic obstructive pulmonary disease, unspecified: Secondary | ICD-10-CM

## 2014-11-22 DIAGNOSIS — R918 Other nonspecific abnormal finding of lung field: Secondary | ICD-10-CM | POA: Diagnosis not present

## 2014-11-22 MED ORDER — ALBUTEROL SULFATE HFA 108 (90 BASE) MCG/ACT IN AERS: 2.0000 | INHALATION_SPRAY | Freq: Four times a day (QID) | RESPIRATORY_TRACT | Status: DC | PRN

## 2014-11-22 NOTE — Patient Instructions (Addendum)
ICD-9-CM ICD-10-CM   1. Chronic respiratory failure with hypoxia 518.83 J96.11    799.02    2. Chronic obstructive pulmonary disease, unspecified COPD, unspecified chronic bronchitis type 496 J44.9   3. Lung nodules 793.19 R91.8     #COPD  - stable - To bad Medicare would not approve the indigent system - You are an  inspiration to  COPD patient's - Keep up positive outlook an active lifestyle  - continue inhalers and o2 - spiriva and flovent  - review need for triple therapy inhalers next visit  - use alb as needed   #SMoking  - glad you quit  #Lung nodule - stable small nodule in dec 2015  - repeat CT chest dec 2016   #Followup  dec 2016 after CT chest

## 2014-11-22 NOTE — Progress Notes (Signed)
Subjective:    Patient ID: Cody [name], male    DOB: 11/01/1950, 64 y.o.   MRN: [id number]  HPI     #Admitted 04/22/2012 through 05/29/2012  - Influenza A, MEtapneumoviris and H. Influenzae related acute respiratory failure resulting in prolonged mechanical ventilation and status post tracheostomy 25/95/6387  - Course complicated by  - Delirium - resolved  - A Fib - resolved 05/09/12  - Acute Kidnye Injury - resolved  - Enterobacter UTI 05/14/12 - resolved  - Left-sided empyema status post left-sided pigtail catheter  - Pulmonary embolism with bilateral DVT 05/02/12 and 05/03/12: Rx with IV heparin/coumadin but then needing retrievable IVC filterin addition 05/04/2012 Due to MOBILE DVT & bleeding from tracheostomy site - s/p PEG tube 05/15/12  - deconditioning with transfer to inpatient pulmonary rehabilitation    #Admitted 06/03/2012 through 06/18/2012  - Acute and chronic critical illness . Methicillin sensitive staphylococcal aureus pneumonia and associated left-sided empyema with cavitary pneumonia as of 06/09/12 CT chest (new finding) and s/p Catheter placement 06/11/12 under CT guidance  - Course complicated by  - 9 cm chest wall hematoma 06/16/12 and dc of IV heparin  - Removal of PEG tube  - Deconditioning requiring inpatient rehabilitation   ..................................... 12/16/2012 57m FU  He was lost to followup for 5 months and is now back with worsening hypoxia -[facility]  Oxygen saturation was 76% on arrival are improved to 90% on 2 L nasal cannula CXR 12/15/12- no pna  He admits to slipping up and Started smoking again - glue allergy from patch, chantix makes him 'dumb', trying gum He is not on anticoagulation and has his IVC filter.  He has seen a hematologist for polycythemia He reports an episode of blood in stool but has refused colonoscopy He was working as a Dealer for [employer], [place] - [place]  11/23/2013  Chief Complaint  Patient presents with  . Follow-up    Pt states his PCP Carola Rhine, PA, with [facility] suggested pt to come in for f/u. Pt c/o superficial tingling throughout his body and DOE. Pt denies CP/tightness.    Followup COPD with chronic respiratory failure: We have not seen him in the past one year. He has always followed with his primary physician. He is finally showed up in the pulmonary office. 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 He uses 4 L of oxygen continuous. He is not taking any inhalers or nebulizers. He is fairly symptomatic with shortness of breath at class III levels. He does not feel that his quality of life is good. He is worried that he will be disabled. He wants to work. He is also upset that he is "dependent" on oxygen. He does not have a portable oxygen system and wishes 1 but I am letting from my coordinator that because of his Medicaid status he cannot get one He tried Symbicort once and it made him very tachycardic. He is Spiriva with him but has never tried it. He is open to trying new inhalers or nebulizers as advised. He also expresses financial struggles.   Smoking: He relapsed and is smoking but apparently one week agoquit . He feels able to smoking forever at this time  Lung nodules: All his CT scans of the chest has been in the outside facility. In discussing with his physician assistant I lung that  these are stable in the neck CT scan is required in the summer of 2016..   01/24/2014 Follow up COPD/O2 dependent  Pt returns for a 2 month COPD follow up.  Has noticed that his dyspnea is progressively worsening over last year. Does not feel rebounds as quickly.  No flare of cough or wheezing.  Taking Spiriva and Flovent . Could not tolerate Symbicort, caused jitteriness. .  Denies any increased cough, wheezing, tightness, f/c/s, n/v/d,  hemoptysis Is under a lot of stress, raising 3 grandchildren by himself for last 10 years. They are 16-19 yrs old.  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  Has not smoked since last ov, encouraged on cessation.     OV 05/04/2014 Chief Complaint  Patient presents with  . Follow-up    Pt stated his breathing is improved since last OV. Pt stated he only gets SOB with heavy exertion and without O2. Pt c/o cough with difficulty bringing mucus up-clear in color. Pt denies CP/tightness.      COPD : Follow-up for severe COPD with chronic respiratory failure. He is compliant with his oxygen and triple inhaler therapy. He is extremely functional. He continues to work as a Dealer. He finds that the oxygen use is disruptive to his quality-of-life. He describes that he has to travel to the oxygen company on a weekly basis and load up to 28 cylinders of oxygen and his car and stored in his car and his workplace. The oxygen cylinders interfere with his ability to be a Dealer. He wants to switch to switch to [facility] system which will allow him to continue to function as a Dealer and be less disruptive to his quality of life and be more productive with his income. He continues to support his teen grandchildren    SMoking:  reports that he quit smoking about 5 months ago. His smoking use included Cigarettes. He has a 50 pack-year smoking history. He has never used smokeless tobacco.   Lung nodules: Ct chest 03/25/14: Stable 4 mm nodule in the right upper lobe. This has been stable since 06/09/2012 and likely benign.   OV 11/22/2014  Chief Complaint  Patient presents with  . Follow-up    Pt states his breathing is unchanged since last OV. Pt states his insurance will not cover innogen. Pt c/o mild cough. Pt denies CP/tightness.     COPD: This is stable. He continues on 3 L oxygen. He says that in the daytime at rest without oxygen with good breathing technique he can  maintain saturation over 92% but at night this can drop even at rest to 81%. He feels he needs his oxygen. He did not qualify for indigent system. 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 He keeps up with a positive outlook quit smoking in remission. He is motivated by the desire to support his young children. Of note he is only on Spiriva and Flovent. I notice that he is not on any short-acting beta agonist or long-acting beta agonist. I do not know why  Smoking:  reports that he quit smoking about 12 months ago. His smoking use included Cigarettes. He has a 50 pack-year smoking history. He has never used smokeless tobacco.   Lung nodules: He is due for a CT scan of the chest in December 2016    Current outpatient prescriptions:  .  amLODipine (NORVASC) 10 MG tablet, Take 5 mg by mouth every morning. Take  0.5 tablets (5 mg total) by mouth daily., Disp: , Rfl:  .  fluticasone (FLOVENT HFA) 44 MCG/ACT inhaler, Inhale 2 puffs into the lungs 2 (two) times daily., Disp: 1 Inhaler, Rfl: 12 .  hydrochlorothiazide (HYDRODIURIL) 12.5 MG tablet, Take 2 tablets (25 mg total) by mouth daily., Disp: 90 tablet, Rfl: 3 .  ibuprofen (ADVIL,MOTRIN) 200 MG tablet, Take 600 mg by mouth every 8 (eight) hours as needed. , Disp: , Rfl:  .  metFORMIN (GLUCOPHAGE) 500 MG tablet, Take by mouth 2 (two) times daily with a meal., Disp: , Rfl:  .  tiotropium (SPIRIVA) 18 MCG inhalation capsule, Place 1 capsule (18 mcg total) into inhaler and inhale daily., Disp: 30 capsule, Rfl: 12 .  valsartan (DIOVAN) 40 MG tablet, Take 40 mg by mouth daily., Disp: , Rfl:    Review of Systems  Constitutional: Negative for fever and unexpected weight change.  HENT: Negative for congestion, dental problem, ear pain, nosebleeds, postnasal drip, rhinorrhea, sinus pressure, sneezing, sore throat and trouble swallowing.   Eyes: Negative for redness and itching.  Respiratory: Positive for cough and  shortness of breath. Negative for chest tightness and wheezing.   Cardiovascular: Negative for palpitations and leg swelling.  Gastrointestinal: Negative for nausea and vomiting.  Genitourinary: Negative for dysuria.  Musculoskeletal: Negative for joint swelling.  Skin: Negative for rash.  Neurological: Negative for headaches.  Hematological: Does not bruise/bleed easily.  Psychiatric/Behavioral: Negative for dysphoric mood. The patient is not nervous/anxious.        Objective:   Physical Exam  Constitutional: He is oriented to person, place, and time. He appears well-developed and well-nourished. No distress.  Body mass index is 32.71 kg/(m^2).   HENT:  Head: Normocephalic and atraumatic.  Right Ear: External ear normal.  Left Ear: External ear normal.  Mouth/Throat: Oropharynx is clear and moist. No oropharyngeal exudate.  Oxygen on Tracheostomy scar present  Eyes: Conjunctivae and EOM are normal. Pupils are equal, round, and reactive to light. Right eye exhibits no discharge. Left eye exhibits no discharge. No scleral icterus.  Neck: Normal range of motion. Neck supple. No JVD present. No tracheal deviation present. No thyromegaly present.  Cardiovascular: Normal rate, regular rhythm and intact distal pulses.  Exam reveals no gallop and no friction rub.   No murmur heard. Pulmonary/Chest: Effort normal and breath sounds normal. No respiratory distress. He has no wheezes. He has no rales. He exhibits no tenderness.  Viceral  obesity present  Abdominal: Soft. Bowel sounds are normal. He exhibits no distension and no mass. There is no tenderness. There is no rebound and no guarding.  Musculoskeletal: Normal range of motion. He exhibits no edema or tenderness.  Lymphadenopathy:    He has no cervical adenopathy.  Neurological: He is alert and oriented to person, place, and time. He has normal reflexes. No cranial nerve deficit. Coordination normal.  Skin: Skin is warm and dry. No  rash noted. He is not diaphoretic. No erythema. No pallor.  Psychiatric: He has a normal mood and affect. His behavior is normal. Judgment and thought content normal.  Nursing note and vitals reviewed.   Filed Vitals:   11/22/14 1451  BP: 130/84  Pulse: 98  Height: 5\' 10"  (1.778 m)  Weight: 228 lb (103.42 kg)  SpO2: 95%         Assessment & Plan:     ICD-9-CM ICD-10-CM   1. Chronic respiratory failure with hypoxia 518.83 J96.11    799.02    2. Chronic  obstructive pulmonary disease, unspecified COPD, unspecified chronic bronchitis type 496 J44.9   3. Lung nodules 793.19 R91.8    #COPD  - stable - To bad Medicare would not approve the indigent system - You are an  inspiration to  COPD patient's - Keep up positive outlook an active lifestyle  - continue inhalers and o2 - spiriva and flovent  - review need for triple therapy inhalers next visit  - use alb as needed   #SMoking  - glad you quit  #Lung nodule - stable small nodule in dec 2015  - repeat CT chest dec 2016   #Followup  dec 2016 after CT chest     Dr. Brand Males, M.D., [facility].C.P Pulmonary and Critical Care Medicine Staff Physician [facility] Pager: [phone], If no answer or between  15:00h - 7:00h: call [phone]  11/22/2014 3:20 PM

## 2015-03-09 ENCOUNTER — Other Ambulatory Visit: Payer: Self-pay | Admitting: Cardiothoracic Surgery

## 2015-03-09 ENCOUNTER — Inpatient Hospital Stay: Admission: RE | Admit: 2015-03-09 | Payer: Medicaid Other | Source: Ambulatory Visit

## 2015-03-09 ENCOUNTER — Telehealth: Payer: Self-pay | Admitting: Internal Medicine

## 2015-03-09 DIAGNOSIS — I712 Thoracic aortic aneurysm, without rupture, unspecified: Secondary | ICD-10-CM

## 2015-03-09 DIAGNOSIS — I719 Aortic aneurysm of unspecified site, without rupture: Secondary | ICD-10-CM

## 2015-03-09 NOTE — Telephone Encounter (Signed)
Pt was scheduled for CT chest. Called pt and LMTCB x1

## 2015-03-10 NOTE — Telephone Encounter (Signed)
LMTCB x2  

## 2015-03-13 ENCOUNTER — Inpatient Hospital Stay: Admission: RE | Admit: 2015-03-13 | Payer: Medicaid Other | Source: Ambulatory Visit

## 2015-03-13 NOTE — Telephone Encounter (Signed)
Left message to call back  

## 2015-03-14 NOTE — Telephone Encounter (Signed)
LMTCB x 3 

## 2015-03-15 NOTE — Telephone Encounter (Signed)
lmtcb X4 for pt.  Will close per triage protocol.  

## 2015-03-22 ENCOUNTER — Ambulatory Visit: Payer: Medicaid Other | Admitting: Cardiothoracic Surgery

## 2015-03-22 ENCOUNTER — Inpatient Hospital Stay: Admission: RE | Admit: 2015-03-22 | Payer: Medicaid Other | Source: Ambulatory Visit

## 2015-05-29 ENCOUNTER — Telehealth: Payer: Self-pay | Admitting: Internal Medicine

## 2015-05-29 DIAGNOSIS — R918 Other nonspecific abnormal finding of lung field: Secondary | ICD-10-CM

## 2015-05-29 NOTE — Telephone Encounter (Signed)
Pt cb, will cb in about 72mins did not want to leave number

## 2015-05-29 NOTE — Telephone Encounter (Signed)
Pt had cancelled and [facility] his CT x2 in December Spoke with [facility] to see if another order needs to be placed or if pt can call CT to [facility] >> was advised to place another order Order to CT w/o contrast to f/u nodule placed, with need for ov after to discuss results Pt is aware and okay with the above and is aware he will be contacted to schedule CT and f/u appt Nothing further needed; will sign off   Per 8.16.16 ov w/ MR: #Lung nodule - stable small nodule in dec 2015  - repeat CT chest dec 2016  #Followup  dec 2016 after CT chest

## 2015-05-29 NOTE — Telephone Encounter (Signed)
Per message 03/09/15 pt was scheduled for CT and NS lmtcb x1 for pt

## 2015-05-31 ENCOUNTER — Ambulatory Visit: Payer: Medicaid Other | Admitting: Cardiothoracic Surgery

## 2015-06-01 ENCOUNTER — Encounter: Payer: Self-pay | Admitting: Cardiology

## 2015-06-01 ENCOUNTER — Ambulatory Visit (INDEPENDENT_AMBULATORY_CARE_PROVIDER_SITE_OTHER)
Admission: RE | Admit: 2015-06-01 | Discharge: 2015-06-01 | Disposition: A | Discharge status: Home / Self Care | Payer: Medicare Other | Source: Ambulatory Visit | Attending: Internal Medicine | Admitting: Internal Medicine

## 2015-06-01 ENCOUNTER — Encounter (INDEPENDENT_AMBULATORY_CARE_PROVIDER_SITE_OTHER): Payer: Self-pay

## 2015-06-01 DIAGNOSIS — R918 Other nonspecific abnormal finding of lung field: Secondary | ICD-10-CM | POA: Diagnosis not present

## 2015-06-02 ENCOUNTER — Telehealth: Payer: Self-pay | Admitting: Internal Medicine

## 2015-06-02 NOTE — Telephone Encounter (Signed)
Elsie as below  1. Nodules are stable and therefore benign but we will do annual ct low dose screening 2. He has 4.6cm ascendicing aorta aneurysm. Does he know about it? Is he seeing anybody about it?   Ct Chest Wo Contrast  06/01/2015  CLINICAL DATA:  COPD. Diabetes. Hemochromatosis. Stroke. Thoracic aortic aneurysm. Lung nodules. EXAM: CT CHEST WITHOUT CONTRAST TECHNIQUE: Multidetector CT imaging of the chest was performed following the standard protocol without IV contrast. COMPARISON:  03/25/2014 FINDINGS: Mediastinum/Nodes: Coronary, aortic arch, and branch vessel atherosclerotic vascular disease. Proximal ascending thoracic aortic caliber 4.6 cm, stable, measured on image 29 series 2. Precarinal lymph node 1.4 cm in short axis, image 23 series 2, formerly the same. No cardiomegaly. Small lymph nodes adjacent to the descending thoracic aorta distally. Lungs/Pleura: 4 mm right upper lobe pulmonary nodule on image 11 series 3, no change from 03/25/2014. Other small right upper lobe pulmonary nodules are likewise stable. Stable scarring in the left lung prior nodularity along the region of scarring in the left lower lobe shown on the prior exam has resolved, with only a thin linear band of density remaining in this vicinity. Upper abdomen: Cholelithiasis. 1.6 by 2.2 cm adenoma of the lateral limb left adrenal gland. Musculoskeletal: Lipoma in the left infraspinatus muscle. Mild thoracic spondylosis. IMPRESSION: 1. The tiny pulmonary nodules in the right upper lobe are stable and accordingly thought to be benign. The left basilar nodule has essentially resolved. 2. Coronary, aortic arch, and branch vessel atherosclerotic vascular disease. 3. Scattered scarring in the left lung. 4. Cholelithiasis. 5. Small left adrenal adenoma. 6. Stable 4.6 cm ascending aortic aneurysm. Recommend semi-annual imaging followup by CTA or MRA and referral to cardiothoracic surgery if not already obtained. This recommendation  follows 2010 ACCF/AHA/AATS/ACR/ASA/SCA/SCAI/SIR/STS/SVM Guidelines for the Diagnosis and Management of Patients With Thoracic Aortic Disease. Circulation. 2010; [id number] Electronically Signed   By: Van Clines M.D.   On: 06/01/2015 14:34

## 2015-06-06 ENCOUNTER — Ambulatory Visit (INDEPENDENT_AMBULATORY_CARE_PROVIDER_SITE_OTHER): Payer: Medicare Other | Admitting: Adult Health

## 2015-06-06 ENCOUNTER — Encounter: Payer: Self-pay | Admitting: Adult Health

## 2015-06-06 VITALS — BP 120/84 | HR 101 | Temp 98.5°F | Ht 70.0 in | Wt 223.0 lb

## 2015-06-06 DIAGNOSIS — J449 Chronic obstructive pulmonary disease, unspecified: Secondary | ICD-10-CM | POA: Diagnosis not present

## 2015-06-06 DIAGNOSIS — J9611 Chronic respiratory failure with hypoxia: Secondary | ICD-10-CM | POA: Diagnosis not present

## 2015-06-06 NOTE — Assessment & Plan Note (Signed)
Continue on Spiriva and Flovent .  Continue on Oxygen 2l/m .  Evaluate for portable concentrator.  Follow up with Dr. Chase Caller in 3-4  Months and As needed

## 2015-06-06 NOTE — Telephone Encounter (Signed)
lmtcb for pt.  

## 2015-06-06 NOTE — Patient Instructions (Addendum)
Continue on Spiriva and Flovent .  Continue on Oxygen 2l/m .  Evaluate for portable concentrator.  Follow up with Dr. Chase Caller in 3-4  Months and As needed

## 2015-06-06 NOTE — Progress Notes (Signed)
Subjective:    Patient ID: AARONJAMES ENCISO, male    DOB: 01/30/1951, 65 y.o.   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   06/06/2015 Follow up : COPD /O2 dependent Pt returns for 6 month follow up for COPD .  Says overall breathing is doing okay, gets winded with long distance walking or inclines/stairs.  He remains Spiriva daily and Flovent Twice daily   On oxygen 2l/m  PVX , Prevnar, Flu are utd.  Denies chest pain,orthopnea, edema or hemoptysis.  Had CT chest on 2/23 that showed stable lung nodules since 2015 felt to be benign.   Past Medical History  Diagnosis Date  . Hyperlipidemia   . GERD (gastroesophageal reflux disease)   . COPD (chronic obstructive pulmonary disease) ([place])   . Shortness of breath   . Diabetes mellitus without complication ([place])   . Pulmonary embolism ([place]) 05/2012    filter for blood clots  . Shoulder pain     left  . Oxygen dependent   . Anxiety     panic attacks  . Hemochromatosis   . Rectal bleed   . Pneumonia     H1N1- admitted in respiratory failure in ED- 04/2012   . Stroke [facility])     2007, R eye (lower vision - lost ) , carotid studies done recently- clear  . Bleeding nose     history- 1998?, hosp. for for 1 week, per pt.   . Neuromuscular disorder ([place])     numbness in toes in R foot, " back" , spine neurology   . Arthritis     shoulders, neck   . Blood dyscrasia     response to heparin, protamine- see note in allergy category   . Thoracic aortic aneurysm (HCC)     4.4 cm ascending thoracic aortic aneurysm by CT 09/15/12  . Erythrocytosis     felt related to smoking; requires periodic phlebotomy (HEM: Dr. Para March)  . Hypertension    Current Outpatient Prescriptions on File Prior to Visit  Medication Sig Dispense Refill  . albuterol (PROVENTIL HFA;VENTOLIN HFA) 108 (90 BASE) MCG/ACT inhaler Inhale 2 puffs into the lungs every 6 (six) hours as needed for wheezing or shortness of breath. 1  Inhaler 6  . amLODipine (NORVASC) 10 MG tablet Take 5 mg by mouth every morning. Take 0.5 tablets (5 mg total) by mouth daily.    . fluticasone (FLOVENT HFA) 44 MCG/ACT inhaler Inhale 2 puffs into the lungs 2 (two) times daily. 1 Inhaler 12  . hydrochlorothiazide (HYDRODIURIL) 12.5 MG tablet Take 2 tablets (25 mg total) by mouth daily. 90 tablet 3  . ibuprofen (ADVIL,MOTRIN) 200 MG tablet Take 600 mg by mouth every 8 (eight) hours as needed.     . metFORMIN (GLUCOPHAGE) 500 MG tablet Take by mouth 2 (two) times daily with a meal.    . tiotropium (SPIRIVA) 18 MCG inhalation capsule Place 1 capsule (18 mcg total) into inhaler and inhale daily. 30 capsule 12  . valsartan (DIOVAN) 40 MG tablet Take 40 mg by mouth daily.     No current facility-administered medications on file prior to visit.      Review of Systems  Constitutional:   No  weight loss, night sweats,  Fevers, chills, fatigue, or  lassitude.  HEENT:   No headaches,  Difficulty swallowing,  Tooth/dental problems, or  Sore throat,  No sneezing, itching, ear ache, nasal congestion, post nasal drip,   CV:  No chest pain,  Orthopnea, PND, swelling in lower extremities, anasarca, dizziness, palpitations, syncope.   GI  No heartburn, indigestion, abdominal pain, nausea, vomiting, diarrhea, change in bowel habits, loss of appetite, bloody stools.   Resp:    No chest wall deformity  Skin: no rash or lesions.  GU: no dysuria, change in color of urine, no urgency or frequency.  No flank pain, no hematuria   MS:  No joint pain or swelling.  No decreased range of motion.  No back pain.  Psych:  No change in mood or affect. No depression or anxiety.  No memory loss.          Objective:   Physical Exam  Filed Vitals:   06/06/15 1010  BP: 120/84  Pulse: 101  Temp: 98.5 F (36.9 C)  TempSrc: Oral  Height: 5\' 10"  (1.778 m)  Weight: 223 lb (101.152 kg)  SpO2: 97%   GEN: A/Ox3; pleasant , NAD,  Elderly    HEENT:  [place]/AT,  EACs-clear, TMs-wnl, NOSE-clear, THROAT-clear, no lesions, no postnasal drip or exudate noted.   NECK:  Supple w/ fair ROM; no JVD; normal carotid impulses w/o bruits; no thyromegaly or nodules palpated; no lymphadenopathy.  RESP  Decreased BS in bases , no accessory muscle use, no dullness to percussion  CARD:  RRR, no m/r/g  , no peripheral edema, pulses intact, no cyanosis or clubbing.  GI:   Soft & nt; nml bowel sounds; no organomegaly or masses detected.  Musco: Warm bil, no deformities or joint swelling noted.   Neuro: alert, no focal deficits noted.    Skin: Warm, no lesions or rashes   Aksh Swart NP-C   Pulmonary and Critical Care  06/06/2015       Assessment & Plan:

## 2015-06-07 NOTE — Telephone Encounter (Signed)
Patient Returned call [phone]

## 2015-06-07 NOTE — Telephone Encounter (Signed)
Spoke with pt. He is aware of his results. He he does know about the ascending aorta aneurysm, he has an upcoming appointment with Cardiology. Nothing further was needed at this time.

## 2015-06-14 ENCOUNTER — Ambulatory Visit (INDEPENDENT_AMBULATORY_CARE_PROVIDER_SITE_OTHER): Payer: Medicare Other | Admitting: Cardiothoracic Surgery

## 2015-06-14 ENCOUNTER — Inpatient Hospital Stay: Admission: RE | Admit: 2015-06-14 | Payer: Medicaid Other | Source: Ambulatory Visit

## 2015-06-14 ENCOUNTER — Encounter: Payer: Self-pay | Admitting: Cardiothoracic Surgery

## 2015-06-14 VITALS — BP 124/84 | HR 105 | Resp 16 | Ht 70.0 in | Wt 223.2 lb

## 2015-06-14 DIAGNOSIS — I712 Thoracic aortic aneurysm, without rupture: Secondary | ICD-10-CM

## 2015-06-14 DIAGNOSIS — I7121 Aneurysm of the ascending aorta, without rupture: Secondary | ICD-10-CM

## 2015-06-14 NOTE — Progress Notes (Signed)
PCP is Chesley Noon, MD Referring Provider is Chesley Noon, MD  Chief Complaint  Patient presents with  . TAA    18 month f/u with CTA CHEST    HPI followup for fusiform ascending aneurysm 4.5 cm noted in 2015 He stopped smoking at that time He has blood pressure control with medication He has severe COPD on home oxygen He has had a previous tracheostomy 2014 when he had acute viral pneumonitis from H1 N1 He denies any hospitalizations for COPD flareup in the past year  CT scan today shows stable fusiform ascending aneurysm 4.5-4.6 cm with no penetrating ulcer or mural thickening. There is a small 4 mm nodule in the right upper lobe which is stable since 2015.    Past Medical History  Diagnosis Date  . Hyperlipidemia   . GERD (gastroesophageal reflux disease)   . COPD (chronic obstructive pulmonary disease) ([place])   . Shortness of breath   . Diabetes mellitus without complication ([place])   . Pulmonary embolism ([place]) 05/2012    filter for blood clots  . Shoulder pain     left  . Oxygen dependent   . Anxiety     panic attacks  . Hemochromatosis   . Rectal bleed   . Pneumonia     H1N1- admitted in respiratory failure in ED- 04/2012   . Stroke [facility])     2007, R eye (lower vision - lost ) , carotid studies done recently- clear  . Bleeding nose     history- 1998?, hosp. for for 1 week, per pt.   . Neuromuscular disorder ([place])     numbness in toes in R foot, " back" , spine neurology   . Arthritis     shoulders, neck   . Blood dyscrasia     response to heparin, protamine- see note in allergy category   . Thoracic aortic aneurysm (HCC)     4.4 cm ascending thoracic aortic aneurysm by CT 09/15/12  . Erythrocytosis     felt related to smoking; requires periodic phlebotomy (HEM: Dr. Para March)  . Hypertension     Past Surgical History  Procedure Laterality Date  . Gastrostomy N/A 05/19/2012    Procedure: PEG Possible Open Gastrostomy;  Surgeon: Shann Medal,  MD;  Location: [place];  Service: General;  Laterality: N/A;  . Video bronchoscopy Bilateral 06/03/2012    Procedure: VIDEO BRONCHOSCOPY WITHOUT FLUORO;  Surgeon: Raylene Miyamoto, MD;  Location: [place];  Service: Cardiopulmonary;  Laterality: Bilateral;  . Multiple tooth extractions    . Tracheostomy  2014    & closure, relative to pneumonia   . Insertion of vena cava filter      to prevent further pumonary embolis    Family History  Problem Relation Age of Onset  . COPD Mother     Social History Social History  Substance Use Topics  . Smoking status: Former Smoker -- 1.00 packs/day for 50 years    Types: Cigarettes    Quit date: 11/06/2013  . Smokeless tobacco: Never Used     Comment: pt did quit back in march 2014-12/16/12  . Alcohol Use: No     Comment: recovering alcoholic- x 14 yrs., but still drinks on occasion - beer & wine     Current Outpatient Prescriptions  Medication Sig Dispense Refill  . albuterol (PROVENTIL HFA;VENTOLIN HFA) 108 (90 BASE) MCG/ACT inhaler Inhale 2 puffs into the lungs every 6 (six) hours as needed for wheezing or shortness  of breath. 1 Inhaler 6  . amLODipine (NORVASC) 10 MG tablet Take 5 mg by mouth every morning. Take 0.5 tablets (5 mg total) by mouth daily.    . diazepam (VALIUM) 5 MG tablet Take 1 tablet by mouth as needed.    . fluticasone (FLOVENT HFA) 44 MCG/ACT inhaler Inhale 2 puffs into the lungs 2 (two) times daily. 1 Inhaler 12  . hydrochlorothiazide (HYDRODIURIL) 12.5 MG tablet Take 2 tablets (25 mg total) by mouth daily. 90 tablet 3  . ibuprofen (ADVIL,MOTRIN) 200 MG tablet Take 600 mg by mouth every 8 (eight) hours as needed.     . metFORMIN (GLUCOPHAGE) 500 MG tablet Take by mouth 2 (two) times daily with a meal.    . tiotropium (SPIRIVA) 18 MCG inhalation capsule Place 1 capsule (18 mcg total) into inhaler and inhale daily. 30 capsule 12  . valsartan (DIOVAN) 40 MG tablet Take 40 mg by mouth daily.     No current  facility-administered medications for this visit.    Allergies  Allergen Reactions  . Protamine Anaphylaxis  . Ace Inhibitors Cough  . Heparin Other (See Comments)    Bleeding, into muscles Bleeding, into muscles    Review of Systems         Review of Systems :  [ y ] = yes, [  ] = no        General :  Weight gain [ trying to lose weight  ]    Weight loss  [   ]  Fatigue [  ]  Fever [  ]  Chills  [  ]   Weakness  [  ]  He stays very active on his [place] acre property         HEENT    Headache [  ]  Dizziness [yes-been seen by neurology ]  Blurred vision [  ] Glaucoma  [  ]                          Nosebleeds [  ] Painful or loose teeth [ yes, has seen oral surgery for extractions-Dr. Hoyt Koch ]        Cardiac :  Chest pain/ pressure [  ]  Resting SOB [  ] exertional SOB [ yes secondary to COPD ]                        Orthopnea [  ]  Pedal edema  [  ]  Palpitations [  ] Syncope/presyncope [ ]                         Paroxysmal nocturnal dyspnea [  ]         Pulmonary : cough [  ]  wheezing [ intermittent ]  Hemoptysis [  ] Sputum [  ] Snoring [  ]                              Pneumothorax [  ]  Sleep apnea [  ]        GI : Vomiting [  ]  Dysphagia [  ]  Melena  [  ]  Abdominal pain [  ] BRBPR [  ]              Heart burn [  ]  Constipation [  ]  Diarrhea  [  ] Colonoscopy [   ]        GU : Hematuria [  ]  Dysuria [  ]  Nocturia [  ] UTI's [  ]        Vascular : Claudication [  ]  Rest pain [  ]  DVT [place]  ] Vein stripping [  ] leg ulcers [  ]                          TIA [  ] Stroke [  ]  Varicose veins [  ]history of pulmonary emboli, caval filter and possible heparin allergy        NEURO :  Headaches  [  ] Seizures [  ] Vision changes [  ] Paresthesias [  ]                                       Seizures [  ] decreased hearing lately        Musculoskeletal :  Arthritis [  ] Gout  [  ]  Back pain [  ]  Joint pain [  ]        Skin :  Rash [  ]  Melanoma [  ] Sores [  ]        Heme :  Bleeding problems [  ]Clotting Disorders [  ] Anemia [  ]Blood Transfusion [ ]  history of hypercoagulation and DVT not currently on anticoagulation        Endocrine : Diabetes [  ] Heat or Cold intolerance [  ] Polyuria [  ]excessive thirst [ ]         Psych : Depression [  ]  Anxiety [  ]  Psych hospitalizations [  ] Memory change [  ]                                               BP 124/84 mmHg  Pulse 105  Resp 16  Ht 5\' 10"  (1.778 m)  Wt 223 lb 3.2 oz (101.243 kg)  BMI 32.03 kg/m2  SpO2 95% Physical Exam       Physical Exam  General: very pleasant middle-aged overweight Caucasian male on oxygen no distress HEENT: Normocephalic pupils equal , dentition poor Neck: Supple without JVD, adenopathy, or bruit Chest: Clear to auscultation, distant breath sounds, no rhonchi, no tenderness             or deformity Cardiovascular: Regular rate and rhythm, no murmur, no gallop, peripheral pulses             palpable in all extremities Abdomen:  Soft, nontender, no palpable mass or organomegaly Extremities: Warm, well-perfused, no clubbing cyanosis edema or tenderness,              +venous stasis changes of the legs Rectal/GU: Deferred Neuro: Grossly non--focal and symmetrical throughout Skin: Clean and dry without rash or ulceration    Diagnostic Tests: CTA of the thoracic aorta purse reviewed showing stable fusiform aneurysm 4.5 cm in diameter . No evidence of intramural thickening or Penetrating ulcer  Impression: Stable aneurysm of the ascending aorta Smoking cessation and blood pressure control are the main  points of therapy Surveillance CT scans every 18 months until aortic diameter reaches 5 cm then every year Plan:return 18 months with a followup CT scan   Len Childs, MD [facility] [phone]

## 2015-06-15 NOTE — Assessment & Plan Note (Signed)
Continue on Oxygen 2l/m .  Evaluate for portable concentrator.  Follow up with Dr. Chase Caller in 3-4  Months and As needed

## 2015-06-29 ENCOUNTER — Telehealth: Payer: Self-pay | Admitting: Internal Medicine

## 2015-06-29 DIAGNOSIS — J449 Chronic obstructive pulmonary disease, unspecified: Secondary | ICD-10-CM

## 2015-06-29 NOTE — Telephone Encounter (Signed)
Order was placed 06/06/15 by TP for POC eval. Called spoke with Arbie Cookey w/ [employer]. They do not provide POC's for medicare/medicaid patients unless they have humana/cigna.  Called spoke with pt and he reports Cody [name] never called and made him aware of this. He wants to know if he would be able to go through another company just for a POC w/o having to switch companies? Please advise PCC's thanks

## 2015-07-03 ENCOUNTER — Other Ambulatory Visit: Payer: Self-pay | Admitting: *Deleted

## 2015-07-03 DIAGNOSIS — J449 Chronic obstructive pulmonary disease, unspecified: Secondary | ICD-10-CM

## 2015-07-03 NOTE — Telephone Encounter (Signed)
Order has been placed.

## 2015-07-03 NOTE — Telephone Encounter (Signed)
I spoke with Cody [name] at [facility].  They have never billed this pt's Medicare so he is not in the middle of billing cycle with them.  He will need to switch to another dme.  Called pt & talked to him.  He would like to go with [facility] in [place].  I called [facility] & they do accept Medicare patients.  Called pt & left him vm to make him aware we can file with [employer].  Will route this message back to triage so order can be put in for [employer].

## 2015-07-03 NOTE — Telephone Encounter (Signed)
PCC's please advise on below.

## 2015-07-04 ENCOUNTER — Telehealth: Payer: Self-pay | Admitting: Adult Health

## 2015-07-04 NOTE — Telephone Encounter (Signed)
Pt called stating that he would like an order to be placed with [facility] for his oxygen. He states that [facility] is discontinuing his oxygen. I explained to him that the order for the POC and his oxygen have been placed on 07/03/15 to [employer]. He voiced understanding and had no further questions. Nothing further needed.

## 2015-07-10 ENCOUNTER — Telehealth: Payer: Self-pay | Admitting: Internal Medicine

## 2015-07-10 NOTE — Telephone Encounter (Signed)
Spoke with pt, states that he is switching to [facility] and will need a qualifying walk for [employer].   Pt scheduled for qualifying walk on Wednesday at 9:00.  Nothing further needed.

## 2015-07-12 ENCOUNTER — Ambulatory Visit (INDEPENDENT_AMBULATORY_CARE_PROVIDER_SITE_OTHER): Payer: Medicare Other | Admitting: Internal Medicine

## 2015-07-12 DIAGNOSIS — J438 Other emphysema: Secondary | ICD-10-CM

## 2015-07-17 ENCOUNTER — Telehealth: Payer: Self-pay | Admitting: Internal Medicine

## 2015-07-17 NOTE — Telephone Encounter (Signed)
Spoke with Joellen Jersey at [employer]. States that they are needing OV notes from 07/12/15, that's when the pt was qualified for oxygen. Camelia Phenes that the pt was not seen on 07/12/15, the only thing was done was the walk. Per Joellen Jersey, Medicare requires the pt to be seen on the same they are qualified for the oxygen. I have left a message with the pt to call back so that we can take care of this. Will await his call back.

## 2015-07-17 NOTE — Telephone Encounter (Signed)
Katie from [facility] returned call - [phone]

## 2015-07-17 NOTE — Telephone Encounter (Signed)
lmtcb for [employer].

## 2015-07-17 NOTE — Telephone Encounter (Signed)
lmtcb x2 for [employer].

## 2015-07-17 NOTE — Telephone Encounter (Signed)
Katie returned call - [phone]

## 2015-07-17 NOTE — Telephone Encounter (Signed)
lmtcb x1 for [employer].

## 2015-07-18 NOTE — Telephone Encounter (Signed)
lmtcb x2 for pt. 

## 2015-07-19 ENCOUNTER — Ambulatory Visit (INDEPENDENT_AMBULATORY_CARE_PROVIDER_SITE_OTHER): Payer: Medicare Other | Admitting: Adult Health

## 2015-07-19 ENCOUNTER — Encounter: Payer: Self-pay | Admitting: Adult Health

## 2015-07-19 VITALS — BP 120/88 | HR 94 | Temp 97.4°F | Ht 70.0 in | Wt 225.0 lb

## 2015-07-19 DIAGNOSIS — J449 Chronic obstructive pulmonary disease, unspecified: Secondary | ICD-10-CM

## 2015-07-19 DIAGNOSIS — J9611 Chronic respiratory failure with hypoxia: Secondary | ICD-10-CM

## 2015-07-19 NOTE — Progress Notes (Signed)
Subjective:    Patient ID: Cody [name], male    DOB: 1950-08-24, 65 y.o.   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   07/19/2015 Follow up : COPD /O2 dependent Pt returns for 6 week follow up for COPD .  Says overall breathing is doing okay, gets winded with long distance walking or inclines/stairs.  He remains Spiriva daily and Flovent Twice daily   On oxygen 2l/m .  Needs O2 qualification for DME.  O2 sat on RA with drop to 88%, . On O2 3l/m with O2 sats at 93%.   PVX , Prevnar, Flu are utd.  Denies chest pain,orthopnea, edema or hemoptysis.  Had CT chest on 2/23 that showed stable lung nodules since 2015 felt to be benign.  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   Past Medical History  Diagnosis Date  . Hyperlipidemia   . GERD (gastroesophageal reflux disease)   . COPD (chronic obstructive pulmonary disease) ([place])   . Shortness of breath   . Diabetes mellitus without complication ([place])   . Pulmonary embolism ([place]) 05/2012    filter for blood clots  . Shoulder pain     left  . Oxygen dependent   . Anxiety     panic attacks  . Hemochromatosis   . Rectal bleed   . Pneumonia     H1N1- admitted in respiratory failure in ED- 04/2012   . Stroke [facility])     2007, R eye (lower vision - lost ) , carotid studies done recently- clear  . Bleeding nose     history- 1998?, hosp. for for 1 week, per pt.   . Neuromuscular disorder ([place])     numbness in toes in R foot, " back" , spine neurology   . Arthritis     shoulders, neck   . Blood dyscrasia     response to heparin, protamine- see note in allergy category   . Thoracic aortic aneurysm (HCC)     4.4 cm ascending thoracic aortic aneurysm by CT 09/15/12  . Erythrocytosis     felt related to smoking; requires periodic phlebotomy (HEM: Dr. Para March)  . Hypertension    Current Outpatient Prescriptions on File Prior to Visit  Medication Sig Dispense Refill  . albuterol (PROVENTIL HFA;VENTOLIN HFA)  108 (90 BASE) MCG/ACT inhaler Inhale 2 puffs into the lungs every 6 (six) hours as needed for wheezing or shortness of breath. 1 Inhaler 6  . amLODipine (NORVASC) 10 MG tablet Take 5 mg by mouth every morning. Take 0.5 tablets (5 mg total) by mouth daily.    . diazepam (VALIUM) 5 MG tablet Take 1 tablet by mouth as needed.    . fluticasone (FLOVENT HFA) 44 MCG/ACT inhaler Inhale 2 puffs into the lungs 2 (two) times daily. 1 Inhaler 12  . hydrochlorothiazide (HYDRODIURIL) 12.5 MG tablet Take 2 tablets (25 mg total) by mouth daily. 90 tablet 3  . ibuprofen (ADVIL,MOTRIN) 200 MG tablet Take 600 mg by mouth every 8 (eight) hours as needed.     . metFORMIN (GLUCOPHAGE) 500 MG tablet Take by mouth 2 (two) times daily with a meal.    . tiotropium (SPIRIVA) 18 MCG inhalation capsule Place 1 capsule (18 mcg total) into inhaler and inhale daily. 30 capsule 12  . valsartan (DIOVAN) 40 MG tablet Take 40 mg by mouth daily.     No current facility-administered medications on file prior to visit.  Review of Systems  Constitutional:   No  weight loss, night sweats,  Fevers, chills, fatigue, or  lassitude.  HEENT:   No headaches,  Difficulty swallowing,  Tooth/dental problems, or  Sore throat,                No sneezing, itching, ear ache, nasal congestion, post nasal drip,   CV:  No chest pain,  Orthopnea, PND, swelling in lower extremities, anasarca, dizziness, palpitations, syncope.   GI  No heartburn, indigestion, abdominal pain, nausea, vomiting, diarrhea, change in bowel habits, loss of appetite, bloody stools.   Resp:    No chest wall deformity  Skin: no rash or lesions.  GU: no dysuria, change in color of urine, no urgency or frequency.  No flank pain, no hematuria   MS:  No joint pain or swelling.  No decreased range of motion.  No back pain.  Psych:  No change in mood or affect. No depression or anxiety.  No memory loss.          Objective:   Physical Exam  Filed Vitals:    07/19/15 1147  BP: 120/88  Pulse: 94  Temp: 97.4 F (36.3 C)  TempSrc: Oral  Height: 5\' 10"  (1.778 m)  Weight: 225 lb (102.059 kg)  SpO2: 95%   GEN: A/Ox3; pleasant , NAD,  Elderly   HEENT:  [place]/AT,  EACs-clear, TMs-wnl, NOSE-clear, THROAT-clear, no lesions, no postnasal drip or exudate noted.   NECK:  Supple w/ fair ROM; no JVD; normal carotid impulses w/o bruits; no thyromegaly or nodules palpated; no lymphadenopathy.  RESP  Decreased BS in bases , no accessory muscle use, no dullness to percussion  CARD:  RRR, no m/r/g  , no peripheral edema, pulses intact, no cyanosis or clubbing.  GI:   Soft & nt; nml bowel sounds; no organomegaly or masses detected.  Musco: Warm bil, no deformities or joint swelling noted.   Neuro: alert, no focal deficits noted.    Skin: Warm, no lesions or rashes   Decie Verne NP-C  [facility]  07/19/2015       Assessment & Plan:

## 2015-07-19 NOTE — Telephone Encounter (Signed)
Spoke with pt. He has been scheduled to see TP today at 11:30am. Nothing further was needed.

## 2015-07-19 NOTE — Telephone Encounter (Signed)
Patient returned call, asked to please call him back at [phone].

## 2015-07-19 NOTE — Assessment & Plan Note (Signed)
Continues on on current regimen  Plan  Continue on Spiriva and Flovent .  Continue on Oxygen 2l/m .  Follow up with Dr. Chase Caller in 3-4  Months and As needed

## 2015-07-19 NOTE — Patient Instructions (Addendum)
Continue on Spiriva and Flovent .  Continue on Oxygen 2l/m .  Follow up with Dr. Chase Caller in 3-4  Months and As needed

## 2015-07-19 NOTE — Assessment & Plan Note (Signed)
Continue on Spiriva and Flovent .  Continue on Oxygen 2l/m .  Follow up with Dr. Chase Caller in 3-4  Months and As needed

## 2015-07-27 ENCOUNTER — Telehealth: Payer: Self-pay | Admitting: Adult Health

## 2015-07-27 DIAGNOSIS — J449 Chronic obstructive pulmonary disease, unspecified: Secondary | ICD-10-CM

## 2015-07-27 DIAGNOSIS — J9611 Chronic respiratory failure with hypoxia: Secondary | ICD-10-CM

## 2015-07-27 NOTE — Telephone Encounter (Signed)
Katie with [employer],, CB [phone] returned call and states she found walk test done on 07/12/2015, this needs to be signed and dated by Dr. Chase Caller.  Also need a new order for oxygen, the order they have is too old.  Ins requires that the order be written 04/12 or later.

## 2015-07-27 NOTE — Telephone Encounter (Signed)
Orders have been placed. Cody [name] is aware of this by message. PCC's will be handling this from here. Nothing further was needed.

## 2015-07-27 NOTE — Telephone Encounter (Signed)
lmtcb x1 for [employer]

## 2015-07-27 NOTE — Telephone Encounter (Signed)
Called spoke with pt. He states that he is calling because he has not received any oxygen from [employer]. He states when he calls them he states that he has not received any information from our office. I explained to him that I would contact [employer] to discuss the situation. He voiced understanding and had no further questions.   Called spoke with Jamal at [employer]. I explained to him that two separate orders were placed on 07/03/15 for a POC eval and oxygen. He needs OV notes and walk test results faxed over to [phone]. I explained to him that the forms would be faxed. He voiced understanding and had no further questions. Forms have been faxed. Nothing further needed.

## 2015-09-01 ENCOUNTER — Encounter: Payer: Self-pay | Admitting: Adult Health

## 2015-09-01 ENCOUNTER — Ambulatory Visit (INDEPENDENT_AMBULATORY_CARE_PROVIDER_SITE_OTHER): Payer: Medicare Other | Admitting: Adult Health

## 2015-09-01 VITALS — BP 128/74 | HR 89 | Ht 70.5 in | Wt 226.2 lb

## 2015-09-01 DIAGNOSIS — J449 Chronic obstructive pulmonary disease, unspecified: Secondary | ICD-10-CM | POA: Diagnosis not present

## 2015-09-01 DIAGNOSIS — J9611 Chronic respiratory failure with hypoxia: Secondary | ICD-10-CM

## 2015-09-01 NOTE — Patient Instructions (Signed)
Continue on Spiriva and Flovent .  Continue on Oxygen 2l/m at rest and 3l/ m with walking . Marland Kitchen  Follow up with Dr. Chase Caller in 3-4  Months and As needed

## 2015-09-01 NOTE — Assessment & Plan Note (Signed)
O2 sat on RA with drop to 86 %,with walking  . On O2 3l/m with O2 sats at 94%.   Plan  Cont on O2 at 2l/m rest and 3l/m walking

## 2015-09-01 NOTE — Assessment & Plan Note (Signed)
Compensated on present regimen   Plan  Continue on Spiriva and Flovent .  Continue on Oxygen 2l/m at rest and 3l/ m with walking . Marland Kitchen  Follow up with Dr. Chase Caller in 3-4  Months and As needed

## 2015-09-01 NOTE — Progress Notes (Signed)
Subjective:    Patient ID: Cody [name], male    DOB: [birth year], 65 y.o.   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   09/01/2015 Follow up : COPD /O2 dependent Pt returns for 2 month follow up for COPD .  Says overall breathing is doing okay, gets winded with long distance walking or inclines/stairs.  He remains Spiriva daily and Flovent Twice daily   On oxygen 2l/m .  Needs O2 qualification for DME. Switching from [employer] to [facility] b/c they can supply more oxygen tanks . (he is very active outside-does not like POC ) .  O2 sat on RA with drop to 86 % with walking , . On O2 3l/m with O2 sats at 94%.   PVX , Prevnar, Flu are utd.  Denies chest pain,orthopnea, edema or hemoptysis.  Had CT chest on 2/23 that showed stable lung nodules since 2015 felt to be benign.  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   Past Medical History  Diagnosis Date  . Hyperlipidemia   . GERD (gastroesophageal reflux disease)   . COPD (chronic obstructive pulmonary disease) ([place])   . Shortness of breath   . Diabetes mellitus without complication ([place])   . Pulmonary embolism ([place]) 05/2012    filter for blood clots  . Shoulder pain     left  . Oxygen dependent   . Anxiety     panic attacks  . Hemochromatosis   . Rectal bleed   . Pneumonia     H1N1- admitted in respiratory failure in ED- 04/2012   . Stroke [facility])     2007, R eye (lower vision - lost ) , carotid studies done recently- clear  . Bleeding nose     history- 1998?, hosp. for for 1 week, per pt.   . Neuromuscular disorder ([place])     numbness in toes in R foot, " back" , spine neurology   . Arthritis     shoulders, neck   . Blood dyscrasia     response to heparin, protamine- see note in allergy category   . Thoracic aortic aneurysm (HCC)     4.4 cm ascending thoracic aortic aneurysm by CT 09/15/12  . Erythrocytosis     felt related to smoking; requires periodic phlebotomy (HEM: Dr. Para March)  . Hypertension      Current Outpatient Prescriptions on File Prior to Visit  Medication Sig Dispense Refill  . albuterol (PROVENTIL HFA;VENTOLIN HFA) 108 (90 BASE) MCG/ACT inhaler Inhale 2 puffs into the lungs every 6 (six) hours as needed for wheezing or shortness of breath. 1 Inhaler 6  . amLODipine (NORVASC) 10 MG tablet Take 5 mg by mouth every morning. Take 0.5 tablets (5 mg total) by mouth daily.    . diazepam (VALIUM) 5 MG tablet Take 1 tablet by mouth as needed.    . fluticasone (FLOVENT HFA) 44 MCG/ACT inhaler Inhale 2 puffs into the lungs 2 (two) times daily. 1 Inhaler 12  . hydrochlorothiazide (HYDRODIURIL) 12.5 MG tablet Take 2 tablets (25 mg total) by mouth daily. 90 tablet 3  . ibuprofen (ADVIL,MOTRIN) 200 MG tablet Take 600 mg by mouth every 8 (eight) hours as needed.     . metFORMIN (GLUCOPHAGE) 500 MG tablet Take by mouth 2 (two) times daily with a meal.    . tiotropium (SPIRIVA) 18 MCG inhalation capsule Place 1 capsule (18 mcg total) into inhaler and inhale daily. 30 capsule 12  .  valsartan (DIOVAN) 40 MG tablet Take 40 mg by mouth daily.     No current facility-administered medications on file prior to visit.      Review of Systems  Constitutional:   No  weight loss, night sweats,  Fevers, chills,  +fatigue, or  lassitude.  HEENT:   No headaches,  Difficulty swallowing,  Tooth/dental problems, or  Sore throat,                No sneezing, itching, ear ache, nasal congestion, post nasal drip,   CV:  No chest pain,  Orthopnea, PND, swelling in lower extremities, anasarca, dizziness, palpitations, syncope.   GI  No heartburn, indigestion, abdominal pain, nausea, vomiting, diarrhea, change in bowel habits, loss of appetite, bloody stools.   Resp:    No chest wall deformity  Skin: no rash or lesions.  GU: no dysuria, change in color of urine, no urgency or frequency.  No flank pain, no hematuria   MS:  No joint pain or swelling.  No decreased range of motion.  No back pain.  Psych:   No change in mood or affect. No depression or anxiety.  No memory loss.          Objective:   Physical Exam  Filed Vitals:   09/01/15 0930  BP: 128/74  Pulse: 89  Height: 5' 10.5" (1.791 m)  Weight: 226 lb 3.2 oz (102.604 kg)  SpO2: 93%   GEN: A/Ox3; pleasant , NAD,  Elderly   HEENT:  [place]/AT,  EACs-clear, TMs-wnl, NOSE-clear, THROAT-clear, no lesions, no postnasal drip or exudate noted. Poor dentition   NECK:  Supple w/ fair ROM; no JVD; normal carotid impulses w/o bruits; no thyromegaly or nodules palpated; no lymphadenopathy.  RESP  Decreased BS in bases , no accessory muscle use, no dullness to percussion  CARD:  RRR, no m/r/g  , no peripheral edema, pulses intact, no cyanosis or clubbing.  GI:   Soft & nt; nml bowel sounds; no organomegaly or masses detected.  Musco: Warm bil, no deformities or joint swelling noted.   Neuro: alert, no focal deficits noted.    Skin: Warm, no lesions or rashes   Tammy Parrett NP-C  [facility]  09/01/2015       Assessment & Plan:

## 2015-09-06 ENCOUNTER — Telehealth: Payer: Self-pay | Admitting: Adult Health

## 2015-09-06 DIAGNOSIS — J449 Chronic obstructive pulmonary disease, unspecified: Secondary | ICD-10-CM

## 2015-09-06 NOTE — Telephone Encounter (Signed)
Called spoke with Leafy Ro at [facility]. She states that they can pull the ov notes from 09/01/15 but need a new order for oxygen. I informed her that I would place it today. She voiced understanding and had no further questions. Order placed. Nothing further questions.

## 2015-09-07 ENCOUNTER — Telehealth: Payer: Self-pay | Admitting: Internal Medicine

## 2015-09-07 DIAGNOSIS — J449 Chronic obstructive pulmonary disease, unspecified: Secondary | ICD-10-CM

## 2015-09-07 NOTE — Telephone Encounter (Signed)
Spoke with Cody [name] and she states that she needs order to be like a new O2 start. Order placed. Nothing further needed.

## 2015-09-08 NOTE — Progress Notes (Signed)
30mwd  Dr. Brand Males, M.D., [facility].C.P Pulmonary and Critical Care Medicine Staff Physician [place] [facility] Pager: [phone], If no answer or between  15:00h - 7:00h: call [phone]  09/08/2015 2:12 AM

## 2015-09-11 ENCOUNTER — Ambulatory Visit: Payer: Medicare Other | Admitting: Internal Medicine

## 2015-10-23 ENCOUNTER — Other Ambulatory Visit: Payer: Medicare Other

## 2015-10-23 ENCOUNTER — Encounter: Payer: Self-pay | Admitting: Internal Medicine

## 2015-10-23 ENCOUNTER — Ambulatory Visit (INDEPENDENT_AMBULATORY_CARE_PROVIDER_SITE_OTHER): Payer: Medicare Other | Admitting: Internal Medicine

## 2015-10-23 VITALS — BP 136/82 | HR 88 | Ht 70.5 in | Wt 211.0 lb

## 2015-10-23 DIAGNOSIS — J9611 Chronic respiratory failure with hypoxia: Secondary | ICD-10-CM | POA: Diagnosis not present

## 2015-10-23 DIAGNOSIS — J449 Chronic obstructive pulmonary disease, unspecified: Secondary | ICD-10-CM | POA: Diagnosis not present

## 2015-10-23 NOTE — Progress Notes (Signed)
Subjective:    Patient ID: Cody [name], male    DOB: 30-Sep-1950, 64 y.o.   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   07/19/2015 Follow up : COPD /O2 dependent Pt returns for 6 week follow up for COPD .  Says overall breathing is doing okay, gets winded with long distance walking or inclines/stairs.  He remains Spiriva daily and Flovent Twice daily   On oxygen 2l/m .  Needs O2 qualification for DME.  O2 sat on RA with drop to 88%, . On O2 3l/m with O2 sats at 93%.   PVX , Prevnar, Flu are utd.  Denies chest pain,orthopnea, edema or hemoptysis.  Had CT chest on 2/23 that showed stable lung nodules since 2015 felt to be benign.  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    OV 10/23/2015  Chief Complaint  Patient presents with  . Follow-up    pt c/o sob with exertion, chest tightness with exertion.      65 year old male with Gold stage IV COPD chronic hypoxemic respiratory failure. Presents for follow-up overall stable. 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 He supports 3-4 grandchildren 2 of which are in college. This is because his daughter is a drug addict and is no longer taking care of children and is not seen the children in 11 years. His main issues that he runs out of oxygen fast. He uses many cylinders. This is because he does heavy farming work. He has changed oxygen companies and is currently with [employer] which she is happy with. He is on Spiriva and Flovent and does not want step up therapy.    has a past medical history of Hyperlipidemia; GERD (gastroesophageal reflux disease); COPD (chronic obstructive pulmonary disease) ([place]); Shortness of breath; Diabetes mellitus without complication ([place]); Pulmonary embolism ([place]) (05/2012); Shoulder pain; Oxygen dependent; Anxiety; Hemochromatosis; Rectal bleed; Pneumonia; Stroke [facility]); Bleeding nose; Neuromuscular disorder ([place]); Arthritis; Blood dyscrasia;  Thoracic aortic aneurysm ([place]); Erythrocytosis; and Hypertension.   reports that he quit smoking about 1 years ago. His smoking use included Cigarettes. He has a 50 pack-year smoking history. He has never used smokeless tobacco.  Past Surgical History  Procedure Laterality Date  . Gastrostomy N/A 05/19/2012    Procedure: PEG Possible Open Gastrostomy;  Surgeon: Shann Medal, MD;  Location: [place] [facility];  Service: General;  Laterality: N/A;  . Video bronchoscopy Bilateral 06/03/2012    Procedure: VIDEO BRONCHOSCOPY WITHOUT FLUORO;  Surgeon: Raylene Miyamoto, MD;  Location: [facility];  Service: Cardiopulmonary;  Laterality: Bilateral;  . Multiple tooth extractions    . Tracheostomy  2014    & closure, relative to pneumonia   . Insertion of vena cava filter      to prevent further pumonary embolis    Allergies  Allergen Reactions  . Protamine Anaphylaxis  . Ace Inhibitors Cough  . Heparin Other (See Comments)    Bleeding, into muscles Bleeding, into muscles    Immunization History  Administered Date(s) Administered  . Influenza Split 05/28/2012, 01/13/2014  . Influenza,inj,Quad PF,36+ Mos 05/18/2014  . Pneumococcal Conjugate-13 01/13/2014  . Pneumococcal Polysaccharide-23 05/28/2012    Family History  Problem Relation Age of Onset  . COPD Mother      Current outpatient prescriptions:  .  albuterol (PROVENTIL HFA;VENTOLIN HFA) 108 (90 BASE) MCG/ACT inhaler, Inhale 2 puffs into the lungs every 6 (six) hours as  needed for wheezing or shortness of breath., Disp: 1 Inhaler, Rfl: 6 .  amLODipine (NORVASC) 10 MG tablet, Take 5 mg by mouth every morning. Take 0.5 tablets (5 mg total) by mouth daily., Disp: , Rfl:  .  diazepam (VALIUM) 5 MG tablet, Take 1 tablet by mouth as needed., Disp: , Rfl:  .  fluticasone (FLOVENT HFA) 44 MCG/ACT inhaler, Inhale 2 puffs into the lungs 2 (two) times daily., Disp: 1 Inhaler, Rfl: 12 .  hydrochlorothiazide (HYDRODIURIL) 12.5 MG tablet, Take 2  tablets (25 mg total) by mouth daily., Disp: 90 tablet, Rfl: 3 .  ibuprofen (ADVIL,MOTRIN) 200 MG tablet, Take 600 mg by mouth every 8 (eight) hours as needed. , Disp: , Rfl:  .  metFORMIN (GLUCOPHAGE) 500 MG tablet, Take by mouth 2 (two) times daily with a meal., Disp: , Rfl:  .  tiotropium (SPIRIVA) 18 MCG inhalation capsule, Place 1 capsule (18 mcg total) into inhaler and inhale daily., Disp: 30 capsule, Rfl: 12 .  valsartan (DIOVAN) 40 MG tablet, Take 40 mg by mouth daily., Disp: , Rfl:      Review of Systems     Objective:   Physical Exam  Constitutional: He is oriented to person, place, and time. He appears well-developed and well-nourished. No distress.  HENT:  Head: Normocephalic and atraumatic.  Right Ear: External ear normal.  Left Ear: External ear normal.  Mouth/Throat: Oropharynx is clear and moist. No oropharyngeal exudate.  Scar from previous tracheostomy present Oxygen on  Eyes: Conjunctivae and EOM are normal. Pupils are equal, round, and reactive to light. Right eye exhibits no discharge. Left eye exhibits no discharge. No scleral icterus.  Neck: Normal range of motion. Neck supple. No JVD present. No tracheal deviation present. No thyromegaly present.  Cardiovascular: Normal rate, regular rhythm and intact distal pulses.  Exam reveals no gallop and no friction rub.   No murmur heard. Pulmonary/Chest: Effort normal and breath sounds normal. No respiratory distress. He has no wheezes. He has no rales. He exhibits no tenderness.  Abdominal: Soft. Bowel sounds are normal. He exhibits no distension and no mass. There is no tenderness. There is no rebound and no guarding.  Visible obesity present  Musculoskeletal: Normal range of motion. He exhibits no edema or tenderness.  Lymphadenopathy:    He has no cervical adenopathy.  Neurological: He is alert and oriented to person, place, and time. He has normal reflexes. No cranial nerve deficit. Coordination normal.  Skin:  Skin is warm and dry. No rash noted. He is not diaphoretic. No erythema. No pallor.  Psychiatric: He has a normal mood and affect. His behavior is normal. Judgment and thought content normal.  Nursing note and vitals reviewed.   Filed Vitals:   10/23/15 0942  BP: 136/82  Pulse: 88  Height: 5' 10.5" (1.791 m)  Weight: 211 lb (95.709 kg)  SpO2: 95%    Estimated body mass index is 29.84 kg/(m^2) as calculated from the following:   Height as of this encounter: 5' 10.5" (1.791 m).   Weight as of this encounter: 211 lb (95.709 kg).       Assessment & Plan:     ICD-9-CM ICD-10-CM   1. Chronic respiratory failure with hypoxia (HCC) 518.83 J96.11    799.02    2. Chronic obstructive pulmonary disease, unspecified COPD type ([place]) 496 J44.9 Alpha-1 antitrypsin phenotype    Stable disease Glad smoking in remission Continue o2, spiriva and flovent scheduled - respect decision for no more step  up Use albuterol as needed Remain active - goal pulse ox > 88% Do alpha 1 genetic test; will call with results FLu shot in fall  Followup 4 months - 5 mohnths or sooner if needed    Dr. Brand Males, M.D., [facility].C.P Pulmonary and Critical Care Medicine Staff Physician [place] [facility] Pager: [phone], If no answer or between  15:00h - 7:00h: call [phone]  10/23/2015 10:04 AM

## 2015-10-23 NOTE — Patient Instructions (Addendum)
ICD-9-CM ICD-10-CM   1. Chronic respiratory failure with hypoxia (HCC) 518.83 J96.11    799.02    2. Chronic obstructive pulmonary disease, unspecified COPD type ([place]) 496 J44.9    Stable disease Glad smoking in remission Continue o2, spiriva and flovent scheduled - respect decision for no more step up Use albuterol as needed Remain active - goal pulse ox > 88% Do alpha 1 genetic test; will call with results FLu shot in fall  Followup 4 months - 5 mohnths or sooner if needed

## 2015-10-27 LAB — ALPHA-1 ANTITRYPSIN PHENOTYPE: A-1 Antitrypsin: 133 mg/dL (ref 83–199)

## 2015-11-01 ENCOUNTER — Telehealth: Payer: Self-pay | Admitting: Internal Medicine

## 2015-11-01 NOTE — Telephone Encounter (Signed)
Notes Recorded by Virl Cagey, CMA on 11/01/2015 at 9:47 AM EDT LM x 1 ------ Notes Recorded by Brand Males, MD on 10/30/2015 at 5:55 PM EDT Alpha 1 normal ---------------------------------------------------- Spoke with pt. He is aware of results. Nothing further was needed.

## 2016-01-11 ENCOUNTER — Other Ambulatory Visit: Payer: Self-pay | Admitting: Physician Assistant

## 2016-01-12 ENCOUNTER — Other Ambulatory Visit: Payer: Self-pay | Admitting: Physician Assistant

## 2016-01-12 DIAGNOSIS — M8580 Other specified disorders of bone density and structure, unspecified site: Secondary | ICD-10-CM

## 2016-01-18 ENCOUNTER — Ambulatory Visit
Admission: RE | Admit: 2016-01-18 | Discharge: 2016-01-18 | Disposition: A | Discharge status: Home / Self Care | Payer: Medicare Other | Source: Ambulatory Visit | Attending: Physician Assistant | Admitting: Physician Assistant

## 2016-01-18 DIAGNOSIS — M8580 Other specified disorders of bone density and structure, unspecified site: Secondary | ICD-10-CM

## 2016-01-30 ENCOUNTER — Telehealth: Payer: Self-pay | Admitting: Internal Medicine

## 2016-01-30 NOTE — Telephone Encounter (Signed)
Attempted to contact pt. Received a message, "the person you are trying to reach is unavailable at this time. Please try your call again later." This form is in MR's fold, Cody [name] has this folder. Will forward message to Cody [name] to follow up on per her request.

## 2016-01-31 ENCOUNTER — Encounter: Payer: Self-pay | Admitting: Emergency Medicine

## 2016-01-31 NOTE — Telephone Encounter (Signed)
Pt calling to check on the status of letter and can be reached @ [phone]  Or [phone] pt needs letter in had before Friday, please advise.Cody [name]'

## 2016-01-31 NOTE — Telephone Encounter (Signed)
Pt aware that everything is being faxed back to [facility] of [employer] office -  Pt wants to come by and pick up the originals today after lunch time. Will send to [facility] to make her aware so that the originals can be placed up front for the patient. Nothing further needed.

## 2016-01-31 NOTE — Telephone Encounter (Signed)
Gave pt his copies

## 2016-01-31 NOTE — Telephone Encounter (Signed)
This has been signed by MR on 01/31/16, a letter has been generated and will be faxed to [place] at [phone]. The form is needing the pt's signature and is due on 02/05/16. LMTCB for pt.

## 2016-01-31 NOTE — Telephone Encounter (Signed)
Called and spoke to pt. Informed pt that he will need to sign form before it is faxed. Pt verbalized and states he will sign form and fax it himself and if there are any questions he will call back. Nothing further needed at this time.

## 2016-02-26 ENCOUNTER — Ambulatory Visit (INDEPENDENT_AMBULATORY_CARE_PROVIDER_SITE_OTHER): Payer: Medicare Other | Admitting: Internal Medicine

## 2016-02-26 ENCOUNTER — Encounter: Payer: Self-pay | Admitting: Internal Medicine

## 2016-02-26 VITALS — BP 138/94 | HR 94 | Ht 70.5 in | Wt 229.0 lb

## 2016-02-26 DIAGNOSIS — J9611 Chronic respiratory failure with hypoxia: Secondary | ICD-10-CM

## 2016-02-26 DIAGNOSIS — J449 Chronic obstructive pulmonary disease, unspecified: Secondary | ICD-10-CM | POA: Diagnosis not present

## 2016-02-26 MED ORDER — GLYCOPYRROLATE-FORMOTEROL 9-4.8 MCG/ACT IN AERO: 2.0000 | INHALATION_SPRAY | Freq: Two times a day (BID) | RESPIRATORY_TRACT | 0 refills | Status: DC

## 2016-02-26 NOTE — Patient Instructions (Addendum)
ICD-9-CM ICD-10-CM   1. Chronic respiratory failure with hypoxia (HCC) 518.83 J96.11    799.02    2. Chronic obstructive pulmonary disease, unspecified COPD type ([place]) 496 J44.9    Stable disease Glad smoking in remission Continue o2, spiriva and flovent scheduled - respect decision for no more step up Use albuterol as needed Remain active - goal pulse ox > 88% FLu shot 02/26/2016 Call for Tamiflu if you're exposed to flu patient or person; we can do preventative regimen  Followup  5 mohnths or sooner if needed

## 2016-02-26 NOTE — Addendum Note (Signed)
Addended by: Collier Salina on: 02/26/2016 01:04 PM   Modules accepted: Orders

## 2016-02-26 NOTE — Progress Notes (Signed)
Subjective:     Patient ID: Cody [name], male   DOB: April 12, 1950, 65 y.o.   MRN: [id number]  HPI    OV 02/26/2016  Chief Complaint  Patient presents with  . Follow-up    Pt states he has had increase in SOB with activity and fatigue x 2-3  weeks. Pt c/o increase in dry cough x 1 week. Pt c/o depression, and see psych.     65 year old male with Gold stage IV COPD chronic hypoxemic respiratory failure. Presents for follow-up overall stable. 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 He supports 3-4 grandchildren 2 of which are in college. This is because his daughter is a drug addict and is no longer taking care of children and is not seen the children in 11 years. His main issues that he runs out of oxygen fast. But in talking to him it appears that room air he desaturates to 80% when he walks 50 feet and then he will recalibrate up a few minutes to 90% on room air at rest. With oxygen he does not desaturate below 92% at all. In this evening for heavy from work since stopping work for several hours He uses many cylinders. This is because he does heavy farming work. He has changed oxygen companies and is currently with [employer] which she is happy with. He is on Spiriva and Flovent and does not want step up therapy. Currently he reports that he is stable but recently has had some depression and is seeing a psychologist. This is because more of his grandchildren have "college. He feels an empty nest at home. There are no other new issues.    Regarding lung cancer screening: He tells me primary care physician has done a CT chest  and was reassured.  CT chest apparently done in the last few months.     has a past medical history of Anxiety; Arthritis; Bleeding nose; Blood dyscrasia; COPD (chronic obstructive pulmonary disease) ([place]); Diabetes mellitus without complication ([place]); Erythrocytosis; GERD (gastroesophageal reflux disease);  Hemochromatosis; Hyperlipidemia; Hypertension; Neuromuscular disorder ([place]); Oxygen dependent; Pneumonia; Pulmonary embolism ([place]) (05/2012); Rectal bleed; Shortness of breath; Shoulder pain; Stroke [facility]); and Thoracic aortic aneurysm ([place]).   reports that he quit smoking about 2 years ago. His smoking use included Cigarettes. He has a 50.00 pack-year smoking history. He has never used smokeless tobacco.  Past Surgical History:  Procedure Laterality Date  . GASTROSTOMY N/A 05/19/2012   Procedure: PEG Possible Open Gastrostomy;  Surgeon: Shann Medal, MD;  Location: [place];  Service: General;  Laterality: N/A;  . INSERTION OF VENA CAVA FILTER     to prevent further pumonary embolis  . MULTIPLE TOOTH EXTRACTIONS    . TRACHEOSTOMY  2014   & closure, relative to pneumonia   . VIDEO BRONCHOSCOPY Bilateral 06/03/2012   Procedure: VIDEO BRONCHOSCOPY WITHOUT FLUORO;  Surgeon: Raylene Miyamoto, MD;  Location: [place];  Service: Cardiopulmonary;  Laterality: Bilateral;    Allergies  Allergen Reactions  . Protamine Anaphylaxis  . Ace Inhibitors Cough  . Heparin Other (See Comments)    Bleeding, into muscles Bleeding, into muscles    Immunization History  Administered Date(s) Administered  . Influenza Split 05/28/2012, 01/13/2014  . Influenza,inj,Quad PF,36+ Mos 05/18/2014  . Pneumococcal Conjugate-13 01/13/2014  . Pneumococcal Polysaccharide-23 05/28/2012    Family History  Problem Relation Age of Onset  . COPD Mother      Current  Outpatient Prescriptions:  .  albuterol (PROVENTIL HFA;VENTOLIN HFA) 108 (90 BASE) MCG/ACT inhaler, Inhale 2 puffs into the lungs every 6 (six) hours as needed for wheezing or shortness of breath., Disp: 1 Inhaler, Rfl: 6 .  amLODipine (NORVASC) 10 MG tablet, Take 5 mg by mouth every morning. Take 0.5 tablets (5 mg total) by mouth daily., Disp: , Rfl:  .  diazepam (VALIUM) 5 MG tablet, Take 1 tablet by mouth as needed., Disp: , Rfl:  .  fluticasone  (FLOVENT HFA) 44 MCG/ACT inhaler, Inhale 2 puffs into the lungs 2 (two) times daily., Disp: 1 Inhaler, Rfl: 12 .  hydrochlorothiazide (HYDRODIURIL) 12.5 MG tablet, Take 2 tablets (25 mg total) by mouth daily., Disp: 90 tablet, Rfl: 3 .  ibuprofen (ADVIL,MOTRIN) 200 MG tablet, Take 600 mg by mouth every 8 (eight) hours as needed. , Disp: , Rfl:  .  metFORMIN (GLUCOPHAGE) 500 MG tablet, Take by mouth 2 (two) times daily with a meal., Disp: , Rfl:  .  tiotropium (SPIRIVA) 18 MCG inhalation capsule, Place 1 capsule (18 mcg total) into inhaler and inhale daily., Disp: 30 capsule, Rfl: 12 .  valsartan (DIOVAN) 40 MG tablet, Take 40 mg by mouth daily., Disp: , Rfl:    Review of Systems     Objective:   Physical Exam  Constitutional: He is oriented to person, place, and time. He appears well-developed and well-nourished. No distress.  HENT:  Head: Normocephalic and atraumatic.  Right Ear: External ear normal.  Left Ear: External ear normal.  Mouth/Throat: Oropharynx is clear and moist. No oropharyngeal exudate.  Oxygen on  Eyes: Conjunctivae and EOM are normal. Pupils are equal, round, and reactive to light. Right eye exhibits no discharge. Left eye exhibits no discharge. No scleral icterus.  Neck: Normal range of motion. Neck supple. No JVD present. No tracheal deviation present. No thyromegaly present.  Cardiovascular: Normal rate, regular rhythm and intact distal pulses.  Exam reveals no gallop and no friction rub.   No murmur heard. Pulmonary/Chest: Effort normal and breath sounds normal. No respiratory distress. He has no wheezes. He has no rales. He exhibits no tenderness.  Barrel chest  Abdominal: Soft. Bowel sounds are normal. He exhibits no distension and no mass. There is no tenderness. There is no rebound and no guarding.  Musculoskeletal: Normal range of motion. He exhibits no edema or tenderness.  Lymphadenopathy:    He has no cervical adenopathy.  Neurological: He is alert and  oriented to person, place, and time. He has normal reflexes. No cranial nerve deficit. Coordination normal.  Skin: Skin is warm and dry. No rash noted. He is not diaphoretic. No erythema. No pallor.  Psychiatric: He has a normal mood and affect. His behavior is normal. Judgment and thought content normal.  Nursing note and vitals reviewed.  Vitals:   02/26/16 0934  BP: (!) 138/94  Pulse: 94  SpO2: 95%  Weight: 229 lb (103.9 kg)  Height: 5' 10.5" (1.791 m)   Estimated body mass index is 32.39 kg/m as calculated from the following:   Height as of this encounter: 5' 10.5" (1.791 m).   Weight as of this encounter: 229 lb (103.9 kg).  ;    Assessment:       ICD-9-CM ICD-10-CM   1. Chronic respiratory failure with hypoxia (HCC) 518.83 J96.11    799.02    2. Chronic obstructive pulmonary disease, unspecified COPD type ([place]) 496 J44.9        Plan:  Stable disease Glad smoking in remission Continue o2, spiriva and flovent scheduled - respect decision for no more step up Use albuterol as needed Remain active - goal pulse ox > 88%   Followup - 5 mohnths or sooner if needed   Dr. Brand Males, M.D., [facility].C.P Pulmonary and Critical Care Medicine Staff Physician [place] [facility] Pager: [phone], If no answer or between  15:00h - 7:00h: call [phone]  02/26/2016 9:53 AM

## 2016-02-27 ENCOUNTER — Telehealth: Payer: Self-pay | Admitting: Internal Medicine

## 2016-02-27 NOTE — Telephone Encounter (Signed)
Will forward to MR to make him aware of when pts last CT was done in case he wants the pt to have another one. FYI.

## 2016-02-28 NOTE — Telephone Encounter (Signed)
He had one feb 2017 in [place]. I realized that after his visit. I will order next ct in 2018 during followup. No action needed now

## 2016-02-28 NOTE — Telephone Encounter (Signed)
Noted. Nothing further needed at this time.  

## 2016-07-26 ENCOUNTER — Encounter: Payer: Self-pay | Admitting: Internal Medicine

## 2016-07-26 ENCOUNTER — Ambulatory Visit (INDEPENDENT_AMBULATORY_CARE_PROVIDER_SITE_OTHER): Payer: Medicare Other | Admitting: Internal Medicine

## 2016-07-26 DIAGNOSIS — J449 Chronic obstructive pulmonary disease, unspecified: Secondary | ICD-10-CM | POA: Diagnosis not present

## 2016-07-26 DIAGNOSIS — R918 Other nonspecific abnormal finding of lung field: Secondary | ICD-10-CM

## 2016-07-26 MED ORDER — FLUTICASONE FUROATE-VILANTEROL 100-25 MCG/INH IN AEPB: 1.0000 | INHALATION_SPRAY | Freq: Every day | RESPIRATORY_TRACT | 5 refills | Status: DC

## 2016-07-26 NOTE — Patient Instructions (Signed)
Stage 4 very severe COPD by GOLD classification ([place]) Slowly progressive disease  Plan  - our office will [facility] [address] and check if you had CT chest past few weeks. If not, we will get CT chest wo contrast for nodules and copd. If you did, wil query results  - continue o2  - continue spiriva - change flovent to breo low dose daily   Followup 4 months or sooner if needed  Lung nodules Get ct chest wo contrast done within our system or query results from [facility]

## 2016-07-26 NOTE — Progress Notes (Signed)
Subjective:     Patient ID: Cody [name], male   DOB: 02/02/1951, 66 y.o.   MRN: [id number]  HPI There is  OV 07/26/2016  Chief Complaint  Patient presents with  . Follow-up    Pt states his breathing has worsened since last OV. Pt c/o increase in SOB, prod cough with clear mucus. Pt denies CP/tightness and f/c/s.    66 year old male with advanced COPD and chronic hypoxemic respiratory failure as a result. There is a routine follow-up. 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 He is using 4 L of oxygen and he says with this and with exertion he desaturates. He is reporting slowly worsening fatigue and shortness of breath. All the cough and wheezing and sputum production are unchanged. This is insidious worsening over many months. He feels he is having progressive disease. It is associated with 20 pound weight gain in the abdomen and visceral obesity. He says that within the last 2 weeks. A CT imaging of his abdomen at tried imaging on [address] and apparently this was normal. He thinks that he also had a CT chest but is not sure. His last CT chest was in February 2017 at which time he nodules. This no fever or colored sputum production. There is no edema or orthopnea paroxysmal nocturnal dyspnea.     has a past medical history of Anxiety; Arthritis; Bleeding nose; Blood dyscrasia; COPD (chronic obstructive pulmonary disease) ([place]); Diabetes mellitus without complication ([place]); Erythrocytosis; GERD (gastroesophageal reflux disease); Hemochromatosis; Hyperlipidemia; Hypertension; Neuromuscular disorder ([place]); Oxygen dependent; Pneumonia; Pulmonary embolism ([place]) (05/2012); Rectal bleed; Shortness of breath; Shoulder pain; Stroke [facility]); and Thoracic aortic aneurysm ([place]).   reports that he quit smoking about 2 years ago. His smoking use included Cigarettes. He has a 50.00 pack-year smoking history. He has never used smokeless tobacco.  Past Surgical History:  Procedure Laterality Date  . GASTROSTOMY  N/A 05/19/2012   Procedure: PEG Possible Open Gastrostomy;  Surgeon: Shann Medal, MD;  Location: [place];  Service: General;  Laterality: N/A;  . INSERTION OF VENA CAVA FILTER     to prevent further pumonary embolis  . MULTIPLE TOOTH EXTRACTIONS    . TRACHEOSTOMY  2014   & closure, relative to pneumonia   . VIDEO BRONCHOSCOPY Bilateral 06/03/2012   Procedure: VIDEO BRONCHOSCOPY WITHOUT FLUORO;  Surgeon: Raylene Miyamoto, MD;  Location: [place];  Service: Cardiopulmonary;  Laterality: Bilateral;    Allergies  Allergen Reactions  . Protamine Anaphylaxis  . Ace Inhibitors Cough  . Heparin Other (See Comments)    Bleeding, into muscles Bleeding, into muscles    Immunization History  Administered Date(s) Administered  . Influenza Split 05/28/2012, 01/13/2014  . Influenza, High Dose Seasonal PF 01/13/2016, 02/13/2016  . Influenza,inj,Quad PF,36+ Mos 05/18/2014  . Pneumococcal Conjugate-13 01/10/2014  . Pneumococcal Polysaccharide-23 04/27/2012, 05/28/2012    Family History  Problem Relation Age of Onset  . COPD Mother      Current Outpatient Prescriptions:  .  amLODipine (NORVASC) 10 MG tablet, Take 5 mg by mouth every morning. Take 0.5 tablets (5 mg total) by mouth daily., Disp: , Rfl:  .  fluticasone (FLOVENT HFA) 44 MCG/ACT inhaler, Inhale 2 puffs into the lungs 2 (two) times daily., Disp: 1 Inhaler, Rfl: 12 .  hydrochlorothiazide (HYDRODIURIL) 12.5 MG tablet, Take 2 tablets (25 mg total) by mouth daily., Disp: 90 tablet, Rfl: 3 .  ibuprofen (ADVIL,MOTRIN) 200 MG tablet, Take 600 mg by mouth every 8 (eight) hours as  needed. , Disp: , Rfl:  .  metFORMIN (GLUCOPHAGE) 500 MG tablet, Take by mouth 2 (two) times daily with a meal., Disp: , Rfl:  .  tiotropium (SPIRIVA) 18 MCG inhalation capsule, Place 1 capsule (18 mcg total) into inhaler and inhale daily., Disp: 30 capsule, Rfl: 12 .  valsartan (DIOVAN) 40 MG tablet, Take 80 mg by mouth daily. , Disp: , Rfl:  .  albuterol  (PROVENTIL HFA;VENTOLIN HFA) 108 (90 BASE) MCG/ACT inhaler, Inhale 2 puffs into the lungs every 6 (six) hours as needed for wheezing or shortness of breath. (Patient not taking: Reported on 07/26/2016), Disp: 1 Inhaler, Rfl: 6 .  Glycopyrrolate-Formoterol (BEVESPI AEROSPHERE) 9-4.8 MCG/ACT AERO, Inhale 2 puffs into the lungs 2 (two) times daily. (Patient not taking: Reported on 07/26/2016), Disp: 2 Inhaler, Rfl: 0   Review of Systems     Objective:   Physical Exam  Constitutional: He is oriented to person, place, and time. He appears well-developed and well-nourished. No distress.  HENT:  Head: Normocephalic and atraumatic.  Right Ear: External ear normal.  Left Ear: External ear normal.  Mouth/Throat: Oropharynx is clear and moist. No oropharyngeal exudate.  o2  on  Eyes: Conjunctivae and EOM are normal. Pupils are equal, round, and reactive to light. Right eye exhibits no discharge. Left eye exhibits no discharge. No scleral icterus.  Neck: Normal range of motion. Neck supple. No JVD present. No tracheal deviation present. No thyromegaly present.  Cardiovascular: Normal rate, regular rhythm and intact distal pulses.  Exam reveals no gallop and no friction rub.   No murmur heard. Pulmonary/Chest: Effort normal and breath sounds normal. No respiratory distress. He has no wheezes. He has no rales. He exhibits no tenderness.  Abdominal: Soft. Bowel sounds are normal. He exhibits no distension and no mass. There is no tenderness. There is no rebound and no guarding.  Visceral obesity +  Musculoskeletal: Normal range of motion. He exhibits no edema or tenderness.  Lymphadenopathy:    He has no cervical adenopathy.  Neurological: He is alert and oriented to person, place, and time. He has normal reflexes. No cranial nerve deficit. Coordination normal.  Skin: Skin is warm and dry. No rash noted. He is not diaphoretic. No erythema. No pallor.  Psychiatric: He has a normal mood and affect. His  behavior is normal. Judgment and thought content normal.  Nursing note and vitals reviewed.    Vitals:   07/26/16 0903  BP: (!) 118/94  Pulse: 92  SpO2: 92%  Weight: 238 lb 9.6 oz (108.2 kg)  Height: 5\' 10"  (1.778 m)    Estimated body mass index is 34.24 kg/m as calculated from the following:   Height as of this encounter: 5\' 10"  (1.778 m).   Weight as of this encounter: 238 lb 9.6 oz (108.2 kg).        Assessment:       ICD-9-CM ICD-10-CM   1. Stage 4 very severe COPD by GOLD classification ([place]) 496 J44.9   2. Lung nodules 793.19 R91.8        Plan:     Stage 4 very severe COPD by GOLD classification ([place]) Slowly progressive disease  Plan  - our [facility] on [address] and check if you had CT chest past few weeks. If not, we will get CT chest wo contrast for nodules and copd. If you did, wil query results  - continue o2  - continue spiriva - change flovent to breo low dose  daily   Followup 4 months or sooner if needed  Lung nodules Get ct chest wo contrast done within our system or query results from [facility]    Dr. Brand Males, M.D., [facility].C.P Pulmonary and Critical Care Medicine Staff Physician [place] [facility] Pager: [phone], If no answer or between  15:00h - 7:00h: call [phone]  07/26/2016 9:22 AM

## 2016-07-26 NOTE — Assessment & Plan Note (Signed)
Slowly progressive disease  Plan  - our office will [facility] on [name] and check if you had CT chest past few weeks. If not, we will get CT chest wo contrast for nodules and copd. If you did, wil query results  - continue o2  - continue spiriva - change flovent to breo low dose daily   Followup 4 months or sooner if needed

## 2016-07-26 NOTE — Assessment & Plan Note (Signed)
Get ct chest wo contrast done within our system or query results from [facility]

## 2016-08-19 ENCOUNTER — Telehealth: Payer: Self-pay | Admitting: Internal Medicine

## 2016-08-19 MED ORDER — GLYCOPYRROLATE-FORMOTEROL 9-4.8 MCG/ACT IN AERO: 2.0000 | INHALATION_SPRAY | Freq: Two times a day (BID) | RESPIRATORY_TRACT | 5 refills | Status: DC

## 2016-08-19 MED ORDER — PREDNISONE 5 MG PO TABS: ORAL_TABLET | ORAL | 0 refills | Status: DC

## 2016-08-19 NOTE — Telephone Encounter (Signed)
Called and spoke with pt and he is aware of appt with TP on Wednesday at [place].  He is aware of MR recs to take the pred taper and he is aware that this has been called to his pharmacy. Nothing further is needed.

## 2016-08-19 NOTE — Telephone Encounter (Signed)
Received call from pt's PCP, Mattie Marlin, NP. She states the pt is having more SOB since being changed to Spiriva and Breo and would like pt seen in office this week. Appt made for pt on 08/21/16 with TP. She verbalized understanding and also states pt is not tolerating the Proair and would like something else prescribed.   MR please advise. Thanks.

## 2016-08-19 NOTE — Telephone Encounter (Signed)
He needs to go on Please take prednisone 40 mg x1 day, then 30 mg x1 day, then 20 mg x1 day, then 10 mg x1 day, and then 5 mg x1 day and stop till he sees TP and TP can consider changing pro-air at that time  If proair not working and he is getting worse, should go to ER  Dr. Brand Males, M.D., [facility].C.P Pulmonary and Critical Care Medicine Staff Physician [place] [facility] Pager: [phone], If no answer or between  15:00h - 7:00h: call [phone]  08/19/2016 2:15 PM

## 2016-08-20 ENCOUNTER — Telehealth: Payer: Self-pay | Admitting: Adult Health

## 2016-08-20 DIAGNOSIS — J449 Chronic obstructive pulmonary disease, unspecified: Secondary | ICD-10-CM

## 2016-08-20 NOTE — Telephone Encounter (Signed)
Per TP: okay for cxr.  Please ask him to arrive 15mins early for his appt to get this done.  Called spoke with patient, advised of TP's recommendations Pt voiced his understanding Clarified with patient - it is documented in this phone note that pt's appt is tomorrow 5.16.18 @ 1100, but the appt is actually at 0900.  Pt is aware.  STAT cxr order placed Nothing further needed; will sign off

## 2016-08-20 NOTE — Telephone Encounter (Signed)
Spoke with pt, who states during his OV with MR he had mentioned a CXR. CXR was not ordered during 07/26/16 ov with MR. Pt is scheduled for acute with TP on 08/21/16 @ 11pm and has requested a CXR prior to visit. Pt reports of increased sob, occ prod cough with clear to white mucus & increased fatigued x3-4d  TP please advise. Thanks.

## 2016-08-21 ENCOUNTER — Other Ambulatory Visit (INDEPENDENT_AMBULATORY_CARE_PROVIDER_SITE_OTHER): Payer: Medicare Other

## 2016-08-21 ENCOUNTER — Telehealth: Payer: Self-pay | Admitting: Adult Health

## 2016-08-21 ENCOUNTER — Ambulatory Visit (INDEPENDENT_AMBULATORY_CARE_PROVIDER_SITE_OTHER)
Admission: RE | Admit: 2016-08-21 | Discharge: 2016-08-21 | Disposition: A | Discharge status: Home / Self Care | Payer: Medicare Other | Source: Ambulatory Visit | Attending: Adult Health | Admitting: Adult Health

## 2016-08-21 ENCOUNTER — Ambulatory Visit (INDEPENDENT_AMBULATORY_CARE_PROVIDER_SITE_OTHER): Payer: Medicare Other | Admitting: Adult Health

## 2016-08-21 ENCOUNTER — Encounter: Payer: Self-pay | Admitting: Adult Health

## 2016-08-21 VITALS — BP 114/76 | HR 101 | Ht 70.0 in | Wt 231.4 lb

## 2016-08-21 DIAGNOSIS — R0602 Shortness of breath: Secondary | ICD-10-CM

## 2016-08-21 DIAGNOSIS — J449 Chronic obstructive pulmonary disease, unspecified: Secondary | ICD-10-CM

## 2016-08-21 DIAGNOSIS — J9611 Chronic respiratory failure with hypoxia: Secondary | ICD-10-CM | POA: Diagnosis not present

## 2016-08-21 LAB — CBC WITH DIFFERENTIAL/PLATELET
BASOS PCT: 1.1 % (ref 0.0–3.0)
Basophils Absolute: 0.1 10*3/uL (ref 0.0–0.1)
EOS PCT: 2.2 % (ref 0.0–5.0)
Eosinophils Absolute: 0.2 10*3/uL (ref 0.0–0.7)
HCT: 47.1 % (ref 39.0–52.0)
HEMOGLOBIN: 15.5 g/dL (ref 13.0–17.0)
Lymphocytes Relative: 18 % (ref 12.0–46.0)
Lymphs Abs: 1.5 10*3/uL (ref 0.7–4.0)
MCHC: 32.9 g/dL (ref 30.0–36.0)
MCV: 91 fl (ref 78.0–100.0)
Monocytes Absolute: 0.8 10*3/uL (ref 0.1–1.0)
Monocytes Relative: 9.4 % (ref 3.0–12.0)
Neutro Abs: 5.9 10*3/uL (ref 1.4–7.7)
Neutrophils Relative %: 69.3 % (ref 43.0–77.0)
Platelets: 208 10*3/uL (ref 150.0–400.0)
RBC: 5.18 Mil/uL (ref 4.22–5.81)
RDW: 14 % (ref 11.5–15.5)
WBC: 8.6 10*3/uL (ref 4.0–10.5)

## 2016-08-21 LAB — BASIC METABOLIC PANEL
BUN: 16 mg/dL (ref 6–23)
CHLORIDE: 91 meq/L — AB (ref 96–112)
CO2: 45 meq/L — AB (ref 19–32)
Calcium: 9.7 mg/dL (ref 8.4–10.5)
Creatinine, Ser: 0.93 mg/dL (ref 0.40–1.50)
GFR: 85.82 mL/min (ref >60.00)
Glucose, Bld: 150 mg/dL — ABNORMAL HIGH (ref 70–99)
POTASSIUM: 4.1 meq/L (ref 3.5–5.1)
Sodium: 139 mEq/L (ref 135–145)

## 2016-08-21 LAB — BRAIN NATRIURETIC PEPTIDE: Pro B Natriuretic peptide (BNP): 9 pg/mL (ref 0.0–100.0)

## 2016-08-21 LAB — D-DIMER, QUANTITATIVE (NOT AT ARMC): D DIMER QUANT: 0.55 ug{FEU}/mL — AB (ref <0.50)

## 2016-08-21 MED ORDER — IOPAMIDOL (ISOVUE-370) INJECTION 76%
80.0000 mL | Freq: Once | INTRAVENOUS | Status: AC | PRN
  Administered 2016-08-21: 80 mL via INTRAVENOUS

## 2016-08-21 MED ORDER — ALBUTEROL SULFATE HFA 108 (90 BASE) MCG/ACT IN AERS: 2.0000 | INHALATION_SPRAY | Freq: Four times a day (QID) | RESPIRATORY_TRACT | 5 refills | Status: DC | PRN

## 2016-08-21 MED ORDER — PREDNISONE 10 MG PO TABS: ORAL_TABLET | ORAL | 0 refills | Status: DC

## 2016-08-21 MED ORDER — AMOXICILLIN-POT CLAVULANATE 875-125 MG PO TABS: 1.0000 | ORAL_TABLET | Freq: Two times a day (BID) | ORAL | 0 refills | Status: DC

## 2016-08-21 NOTE — Progress Notes (Signed)
@Patient  ID: Cody [name], male    DOB: May 05, 1950, 66 y.o.   MRN: [id number]  Chief Complaint  Patient presents with  . Follow-up    COPD     Referring provider: Chesley Noon, MD  HPI: 66 year old male former smoker  followed for severe COPD, chronic hypoxic respiratory failure on oxygen and lung nodules  Critical illness with influenza, aspiration PNA , COPD exeracerbation w/ prolonged hospitalizaton -PE/DVT, Trach  - TEST  CT chest 05/2015 >tiny pulmonary noduels in RUL -scarring in left lung .   08/21/2016 Acute OV  Patient presents for an acute office visit. Says over last week he has had increased cough-dry , wheezing and sob/doe. Feels like the last year his breathing has been getting progressively worse.  Has noticed he needs more oxygen to keep O2 sats >90% . Has been on O2 at 4l/m but when he walks O2 sats drop very low.  Walk test in office shows sats okay at rest on 4l/m . Walk test pt needs 8l/m to keep sats >90%. Walking .  He was called in prednisone taper on 5/14 but did not take.  Suppose to be on BREO and Spriiva . Does not take BREO every day . Still uses vapes.  Weight has trended up 10lbs over last year. Says he was seen by PCP on 5/14 and got abd xray bc abd is enlarged . No fever, abd pain, n/d./v , urinary sx, or increased edema /calf pain.  Does vapes .  Did not take pred taper  CXR today with chronic changes , no acute process.  Pt was suppose to have had a CT chest to follow lung nodules last month but says he did not go for scan.    Allergies  Allergen Reactions  . Protamine Anaphylaxis  . Ace Inhibitors Cough  . Heparin Other (See Comments)    Bleeding, into muscles Bleeding, into muscles    Immunization History  Administered Date(s) Administered  . Influenza Split 05/28/2012, 01/13/2014  . Influenza, High Dose Seasonal PF 01/13/2016, 02/13/2016  . Influenza,inj,Quad PF,36+ Mos 05/18/2014  . Pneumococcal Conjugate-13 01/10/2014  .  Pneumococcal Polysaccharide-23 04/27/2012, 05/28/2012    Past Medical History:  Diagnosis Date  . Anxiety    panic attacks  . Arthritis    shoulders, neck   . Bleeding nose    history- 1998?, hosp. for for 1 week, per pt.   . Blood dyscrasia    response to heparin, protamine- see note in allergy category   . COPD (chronic obstructive pulmonary disease) ([place])   . Diabetes mellitus without complication ([place])   . Erythrocytosis    felt related to smoking; requires periodic phlebotomy (HEM: Dr. Para March)  . GERD (gastroesophageal reflux disease)   . Hemochromatosis   . Hyperlipidemia   . Hypertension   . Neuromuscular disorder ([facility])    numbness in toes in R foot, " back" , spine neurology   . Oxygen dependent   . Pneumonia    H1N1- admitted in respiratory failure in ED- 04/2012   . Pulmonary embolism ([place]) 05/2012   filter for blood clots  . Rectal bleed   . Shortness of breath   . Shoulder pain    left  . Stroke [facility])    2007, R eye (lower vision - lost ) , carotid studies done recently- clear  . Thoracic aortic aneurysm ([facility])    4.4 cm ascending thoracic aortic aneurysm by CT 09/15/12    Tobacco History:  History  Smoking Status  . Former Smoker  . Packs/day: 1.00  . Years: 50.00  . Types: Cigarettes  . Quit date: 11/06/2013  Smokeless Tobacco  . Never Used    Comment: pt did quit back in march 2014-12/16/12   Counseling given: Not Answered   Outpatient Encounter Prescriptions as of 08/21/2016  Medication Sig  . albuterol (PROVENTIL HFA;VENTOLIN HFA) 108 (90 BASE) MCG/ACT inhaler Inhale 2 puffs into the lungs every 6 (six) hours as needed for wheezing or shortness of breath. (Patient not taking: Reported on 07/26/2016)  . amLODipine (NORVASC) 10 MG tablet Take 5 mg by mouth every morning. Take 0.5 tablets (5 mg total) by mouth daily.  . fluticasone (FLOVENT HFA) 44 MCG/ACT inhaler Inhale 2 puffs into the lungs 2 (two) times daily.  . fluticasone furoate-vilanterol  (BREO ELLIPTA) 100-25 MCG/INH AEPB Inhale 1 puff into the lungs daily.  . Glycopyrrolate-Formoterol (BEVESPI AEROSPHERE) 9-4.8 MCG/ACT AERO Inhale 2 puffs into the lungs 2 (two) times daily.  . hydrochlorothiazide (HYDRODIURIL) 12.5 MG tablet Take 2 tablets (25 mg total) by mouth daily.  Marland Kitchen ibuprofen (ADVIL,MOTRIN) 200 MG tablet Take 600 mg by mouth every 8 (eight) hours as needed.   . metFORMIN (GLUCOPHAGE) 500 MG tablet Take by mouth 2 (two) times daily with a meal.  . predniSONE (DELTASONE) 5 MG tablet Take 40 mg  X 1 day, 30 mg x 1 day, 20 mg x 1 day, 10 mg x 1 day, 5 mg x 1 day then stop.  Marland Kitchen tiotropium (SPIRIVA) 18 MCG inhalation capsule Place 1 capsule (18 mcg total) into inhaler and inhale daily.  . valsartan (DIOVAN) 40 MG tablet Take 80 mg by mouth daily.    No facility-administered encounter medications on file as of 08/21/2016.      Review of Systems  Constitutional:   No  weight loss, night sweats,  Fevers, chills, + fatigue, or  lassitude.  HEENT:   No headaches,  Difficulty swallowing,  Tooth/dental problems, or  Sore throat,                No sneezing, itching, ear ache, nasal congestion, post nasal drip,   CV:  No chest pain,  Orthopnea, PND, swelling in lower extremities, anasarca, dizziness, palpitations, syncope.   GI  No heartburn, indigestion, abdominal pain, nausea, vomiting, diarrhea, change in bowel habits, loss of appetite, bloody stools.   Resp: .  No chest wall deformity  Skin: no rash or lesions.  GU: no dysuria, change in color of urine, no urgency or frequency.  No flank pain, no hematuria   MS:  No joint pain or swelling.  No decreased range of motion.  No back pain.    Physical Exam  BP 114/76 (BP Location: Left Arm, Cuff Size: Normal)   Pulse (!) 101   Ht 5\' 10"  (1.778 m)   Wt 231 lb 6.4 oz (105 kg)   SpO2 91%   BMI 33.20 kg/m   GEN: A/Ox3; pleasant , NAD, obese    HEENT:  /AT,  EACs-clear, TMs-wnl, NOSE-clear, THROAT-clear, no lesions,  no postnasal drip or exudate noted. Class 2-3 MP airway , poor dentiton   NECK:  Supple w/ fair ROM; no JVD; normal carotid impulses w/o bruits; no thyromegaly or nodules palpated; no lymphadenopathy.    RESP  Clear  P & A; w/o, wheezes/ rales/ or rhonchi. no accessory muscle use, no dullness to percussion  CARD:  RRR, no m/r/g, no peripheral edema, pulses intact, no cyanosis  or clubbing. No calf pain /tenderness, neg homans sign   GI:   Soft & nt; nml bowel sounds; no organomegaly or masses detected.   Musco: Warm bil, no deformities or joint swelling noted.   Neuro: alert, no focal deficits noted.    Skin: Warm, no lesions or rashes    Lab Results:  CBC  Dg Chest 2 View  Result Date: 08/21/2016 CLINICAL DATA:  Coughing worsened shortness of breath over the last 3 days. EXAM: CHEST  2 VIEW COMPARISON:  07/16/2012 FINDINGS: Heart size is normal. There is aortic atherosclerosis with calcification and tortuosity. There is chronic pulmonary scarring of both lung bases. No sign of identifiable superimposed infiltrate. No lobar collapse. No visible effusion. Bony structures unremarkable. IMPRESSION: Chronic pulmonary scarring in the lower lobes. No acute infiltrate or collapse. Electronically Signed   By: Nelson Chimes M.D.   On: 08/21/2016 09:08     Assessment & Plan:   Stage 4 very severe COPD by GOLD classification ([place]) Acute exacerbation w/ worsening O2 demands ? Etiology  Advised needs hospital admission - he declines and says he is absolutely not going to hospital  His wife is with him today and aware of refusal.  Long discussion regarding reversible cuases for flare but he declines.  Will try to get work up for acute causea and adjust O2 at home to keep sats >90% He has hx of PE , will check bmet if scr ok , will get CT chest PE protocol to make sure no acute PE.  Check labs with BNP /cbc/  pred taper as he has at home from 5/14.  Plan  Patient Instructions  Set up Stat CT  chest .  Prednisone taper as directed.  Mucinex DM Twice daily  As needed cough/congestion  Labs today .  Oxygen 4l/m rest and 8lm walking  Take BREO every day , do not skip doses .  follow up Dr. Chase Caller in 6 weeks and As needed   Please contact office for sooner follow up if symptoms do not improve or worsen or seek emergency care      Chronic respiratory failure with hypoxia Acute on Chroinic with increased O2 demands/  ? Copd flare contributing , vs acute illness/PE /decompensated DCHF, vs Fibrosis  Will get labs with BNP - check CT chest to r/o PE , acute changes on CT  Refuses hospital admit Has o2 at home will adjust o2 to keep sat >90%  4l/m rest 8 walking   Plan  Patient Instructions  Set up Stat CT chest .  Prednisone taper as directed.  Mucinex DM Twice daily  As needed cough/congestion  Labs today .  Oxygen 4l/m rest and 8lm walking  Take BREO every day , do not skip doses .  follow up Dr. Chase Caller in 6 weeks and As needed   Please contact office for sooner follow up if symptoms do not improve or worsen or seek emergency care         Rexene Edison, NP 08/21/2016

## 2016-08-21 NOTE — Assessment & Plan Note (Addendum)
Acute on Chroinic with increased O2 demands/  ? Copd flare contributing , vs acute illness/PE /decompensated DCHF, vs Fibrosis  Will get labs with BNP - check CT chest to r/o PE , acute changes on CT  Refuses hospital admit Has o2 at home will adjust o2 to keep sat >90%  4l/m rest 8 walking   Plan  Patient Instructions  Set up Stat CT chest .  Prednisone taper as directed.  Mucinex DM Twice daily  As needed cough/congestion  Labs today .  Oxygen 4l/m rest and 8lm walking  Take BREO every day , do not skip doses .  follow up Dr. Chase Caller in 6 weeks and As needed   Please contact office for sooner follow up if symptoms do not improve or worsen or seek emergency care

## 2016-08-21 NOTE — Addendum Note (Signed)
Addended by: Parke Poisson E on: 08/21/2016 03:13 PM   Modules accepted: Orders

## 2016-08-21 NOTE — Assessment & Plan Note (Addendum)
Acute exacerbation w/ worsening O2 demands ? Etiology  Advised needs hospital admission - he declines and says he is absolutely not going to hospital  His wife is with him today and aware of refusal.  Long discussion regarding reversible cuases for flare but he declines.  Will try to get work up for acute causea and adjust O2 at home to keep sats >90% He has hx of PE , will check bmet if scr ok , will get CT chest PE protocol to make sure no acute PE.  Check labs with BNP /cbc/  pred taper as he has at home from 5/14.  Plan  Patient Instructions  Set up Stat CT chest .  Prednisone taper as directed.  Mucinex DM Twice daily  As needed cough/congestion  Labs today .  Oxygen 4l/m rest and 8lm walking  Take BREO every day , do not skip doses .  follow up Dr. Chase Caller in 6 weeks and As needed   Please contact office for sooner follow up if symptoms do not improve or worsen or seek emergency care

## 2016-08-21 NOTE — Telephone Encounter (Signed)
Negative CT Angio results discussed with TP Per TP: no blood clot, could be a bad bronchitis.  Let's treat with Augmentin 875mg  twice daily x1 week #14, and change the pred taper to prednisone 10mg  #20 4x2, 3x2, 2x2, 1x2 and stop.  Office visit in 1 week.  Call the office or seek emergency care if symptoms do not improve or they worsen.  Called spoke with patient, discussed recommendations per TP.  Pt okay with this and voiced his understanding.  Pt will pick up Rx's today.  1 week appt scheduled with TP for 5.23.18 @ 0945.  Pt is aware to call the office or seek emergency care if his symptoms do not improve or they worsen prior to his ov.  Rx's sent to verified pharmacy Appt scheduled Nothing further needed at this time; will sign off

## 2016-08-21 NOTE — Patient Instructions (Addendum)
Set up Stat CT chest .  Prednisone taper as directed.  Mucinex DM Twice daily  As needed cough/congestion  Labs today .  Oxygen 4l/m rest and 8lm walking  Take BREO every day , do not skip doses .  follow up Dr. Chase Caller in 6 weeks and As needed   Please contact office for sooner follow up if symptoms do not improve or worsen or seek emergency care

## 2016-08-23 ENCOUNTER — Other Ambulatory Visit: Payer: Self-pay | Admitting: Internal Medicine

## 2016-08-23 DIAGNOSIS — R911 Solitary pulmonary nodule: Secondary | ICD-10-CM

## 2016-08-23 DIAGNOSIS — J449 Chronic obstructive pulmonary disease, unspecified: Secondary | ICD-10-CM

## 2016-08-28 ENCOUNTER — Encounter: Payer: Self-pay | Admitting: Adult Health

## 2016-08-28 ENCOUNTER — Ambulatory Visit (INDEPENDENT_AMBULATORY_CARE_PROVIDER_SITE_OTHER): Payer: Medicare Other | Admitting: Adult Health

## 2016-08-28 DIAGNOSIS — J9611 Chronic respiratory failure with hypoxia: Secondary | ICD-10-CM | POA: Diagnosis not present

## 2016-08-28 DIAGNOSIS — J449 Chronic obstructive pulmonary disease, unspecified: Secondary | ICD-10-CM | POA: Diagnosis not present

## 2016-08-28 NOTE — Assessment & Plan Note (Signed)
Compensated on oxygen. Continue on 4 L to keep O2 saturations greater than 88%

## 2016-08-28 NOTE — Progress Notes (Signed)
@Patient  ID: Cody [name], male    DOB: 1950-11-15, 66 y.o.   MRN: [id number]  Chief Complaint  Patient presents with  . Follow-up    COPD     Referring provider: Chesley Noon, MD  HPI: 66 year old male former smoker  followed for severe COPD, chronic hypoxic respiratory failure on oxygen and lung nodules  Critical illness with influenza, aspiration PNA , COPD exeracerbation w/ prolonged hospitalizaton -PE/DVT, Trach  - TEST  CT chest 05/2015 >tiny pulmonary noduels in RUL -scarring in left lung .   08/28/2016 Follow up: COPD  Patient presents for a one-week follow-up. Patient was seen last visit with increased shortness of breath, cough and congestion. Labs showed a positive d-dimer. Subsequent CT chest was negative for PE, bronchial thickening. 5 mm lung nodule right upper lobe .  Patient was treated with Augmentin and a prednisone taper. Patient returns feeling better with decreased cough /congestion. Breathing is some better.  Remains on BREO and Spiriva daily -says is taking on regular basis now.  Remains on O2 on 4/m .   We discussed a follow-up CT chest in 6 months.  Allergies  Allergen Reactions  . Protamine Anaphylaxis  . Ace Inhibitors Cough  . Heparin Other (See Comments)    Bleeding, into muscles Bleeding, into muscles    Immunization History  Administered Date(s) Administered  . Influenza Split 05/28/2012, 01/13/2014  . Influenza, High Dose Seasonal PF 01/13/2016, 02/13/2016  . Influenza,inj,Quad PF,36+ Mos 05/18/2014  . Pneumococcal Conjugate-13 01/10/2014  . Pneumococcal Polysaccharide-23 04/27/2012, 05/28/2012    Past Medical History:  Diagnosis Date  . Anxiety    panic attacks  . Arthritis    shoulders, neck   . Bleeding nose    history- 1998?, hosp. for for 1 week, per pt.   . Blood dyscrasia    response to heparin, protamine- see note in allergy category   . COPD (chronic obstructive pulmonary disease) ([place])   . Diabetes mellitus  without complication ([place])   . Erythrocytosis    felt related to smoking; requires periodic phlebotomy (HEM: Dr. Para March)  . GERD (gastroesophageal reflux disease)   . Hemochromatosis   . Hyperlipidemia   . Hypertension   . Neuromuscular disorder ([facility])    numbness in toes in R foot, " back" , spine neurology   . Oxygen dependent   . Pneumonia    H1N1- admitted in respiratory failure in ED- 04/2012   . Pulmonary embolism ([place]) 05/2012   filter for blood clots  . Rectal bleed   . Shortness of breath   . Shoulder pain    left  . Stroke [facility])    2007, R eye (lower vision - lost ) , carotid studies done recently- clear  . Thoracic aortic aneurysm (H[facility])    4.4 cm ascending thoracic aortic aneurysm by CT 09/15/12    Tobacco History: History  Smoking Status  . Former Smoker  . Packs/day: 1.00  . Years: 50.00  . Types: Cigarettes  . Quit date: 11/06/2013  Smokeless Tobacco  . Never Used    Comment: pt did quit back in march 2014-12/16/12   Counseling given: Not Answered   Outpatient Encounter Prescriptions as of 08/28/2016  Medication Sig  . albuterol (PROVENTIL HFA;VENTOLIN HFA) 108 (90 Base) MCG/ACT inhaler Inhale 2 puffs into the lungs every 6 (six) hours as needed for wheezing or shortness of breath.  Marland Kitchen amLODipine (NORVASC) 10 MG tablet Take 5 mg by mouth every morning. Take 0.5  tablets (5 mg total) by mouth daily.  Marland Kitchen amoxicillin-clavulanate (AUGMENTIN) 875-125 MG tablet Take 1 tablet by mouth 2 (two) times daily.  . fluticasone furoate-vilanterol (BREO ELLIPTA) 100-25 MCG/INH AEPB Inhale 1 puff into the lungs daily.  . hydrochlorothiazide (HYDRODIURIL) 12.5 MG tablet Take 2 tablets (25 mg total) by mouth daily.  Marland Kitchen ibuprofen (ADVIL,MOTRIN) 200 MG tablet Take 600 mg by mouth every 8 (eight) hours as needed.   . metFORMIN (GLUCOPHAGE) 500 MG tablet Take by mouth 2 (two) times daily with a meal.  . predniSONE (DELTASONE) 10 MG tablet Take 4 tabs for 2 days, then 3 tabs for 2  days, 2 tabs for 2 days, then 1 tab for 2 days, then stop.  Marland Kitchen tiotropium (SPIRIVA) 18 MCG inhalation capsule Place 1 capsule (18 mcg total) into inhaler and inhale daily.  . valsartan (DIOVAN) 40 MG tablet Take 80 mg by mouth daily.    No facility-administered encounter medications on file as of 08/28/2016.      Review of Systems  Constitutional:   No  weight loss, night sweats,  Fevers, chills, +fatigue, or  lassitude.  HEENT:   No headaches,  Difficulty swallowing,  Tooth/dental problems, or  Sore throat,                No sneezing, itching, ear ache, +asal congestion, post nasal drip,   CV:  No chest pain,  Orthopnea, PND, swelling in lower extremities, anasarca, dizziness, palpitations, syncope.   GI  No heartburn, indigestion, abdominal pain, nausea, vomiting, diarrhea, change in bowel habits, loss of appetite, bloody stools.   Resp: .  No chest wall deformity  Skin: no rash or lesions.  GU: no dysuria, change in color of urine, no urgency or frequency.  No flank pain, no hematuria   MS:  No joint pain or swelling.  No decreased range of motion.  No back pain.    Physical Exam  BP 114/74 (BP Location: Left Arm, Cuff Size: Normal)   Pulse 97   Ht 5\' 8"  (1.727 m)   Wt 238 lb 3.2 oz (108 kg)   SpO2 91%   BMI 36.22 kg/m   GEN: A/Ox3; pleasant , NAD, elderly , obese , on O2    HEENT:  [place]/AT,  EACs-clear, TMs-wnl, NOSE-clear, THROAT-clear, no lesions, no postnasal drip or exudate noted.   NECK:  Supple w/ fair ROM; no JVD; normal carotid impulses w/o bruits; no thyromegaly or nodules palpated; no lymphadenopathy.    RESP  Decreased Bs in bases  w/o, wheezes/ rales/ or rhonchi. no accessory muscle use, no dullness to percussion  CARD:  RRR, no m/r/g, no peripheral edema, pulses intact, no cyanosis or clubbing.  GI:   Soft & nt; nml bowel sounds; no organomegaly or masses detected.   Musco: Warm bil, no deformities or joint swelling noted.   Neuro: alert, no focal  deficits noted.    Skin: Warm, no lesions or rashes    Lab Results:  CBC    Component Value Date/Time   WBC 8.6 08/21/2016 1024   RBC 5.18 08/21/2016 1024   HGB 15.5 08/21/2016 1024   HCT 47.1 08/21/2016 1024   PLT 208.0 08/21/2016 1024   MCV 91.0 08/21/2016 1024   MCH 28.0 02/24/2013 0933   MCHC 32.9 08/21/2016 1024   RDW 14.0 08/21/2016 1024   LYMPHSABS 1.5 08/21/2016 1024   MONOABS 0.8 08/21/2016 1024   EOSABS 0.2 08/21/2016 1024   BASOSABS 0.1 08/21/2016 1024  BMET    Component Value Date/Time   NA 139 08/21/2016 1024   K 4.1 08/21/2016 1024   CL 91 (L) 08/21/2016 1024   CO2 45 (H) 08/21/2016 1024   GLUCOSE 150 (H) 08/21/2016 1024   BUN 16 08/21/2016 1024   CREATININE 0.93 08/21/2016 1024   CREATININE 0.90 03/23/2014 1406   CALCIUM 9.7 08/21/2016 1024   GFRNONAA 89 (L) 02/24/2013 0933   GFRAA >90 02/24/2013 0933    BNP No results found for: BNP  ProBNP    Component Value Date/Time   PROBNP 9.0 08/21/2016 1024    Imaging: Dg Chest 2 View  Result Date: 08/21/2016 CLINICAL DATA:  Coughing worsened shortness of breath over the last 3 days. EXAM: CHEST  2 VIEW COMPARISON:  07/16/2012 FINDINGS: Heart size is normal. There is aortic atherosclerosis with calcification and tortuosity. There is chronic pulmonary scarring of both lung bases. No sign of identifiable superimposed infiltrate. No lobar collapse. No visible effusion. Bony structures unremarkable. IMPRESSION: Chronic pulmonary scarring in the lower lobes. No acute infiltrate or collapse. Electronically Signed   By: Nelson Chimes M.D.   On: 08/21/2016 09:08   Ct Angio Chest Pe W Or Wo Contrast  Result Date: 08/21/2016 CLINICAL DATA:  Increased shortness of breath with decreased oxygen saturation in the past week. History of COPD. Ex smoker. Elevated D-dimer. EXAM: CT ANGIOGRAPHY CHEST WITH CONTRAST TECHNIQUE: Multidetector CT imaging of the chest was performed using the standard protocol during bolus  administration of intravenous contrast. Multiplanar CT image reconstructions and MIPs were obtained to evaluate the vascular anatomy. CONTRAST:  80 mL of Isovue 370 intravenous contrast COMPARISON:  Chest radiograph, 08/21/2016.  Chest CT, 06/01/2015. FINDINGS: Cardiovascular: There is satisfactory opacification of the pulmonary arteries. There is no evidence of a pulmonary embolism. The heart is normal in size and configuration. There are mild to moderate coronary artery calcifications. The aorta is dilated to 4.6 cm along its ascending portion. No aortic dissection. Minor atherosclerosis is noted along the ascending aorta and aortic arch and at the origin of the arch branch vessels. Mediastinum/Nodes: No neck base or axillary masses or adenopathy. Mildly enlarged precarinal lymph node measures 14 mm in short axis, stable from the prior exam. Mildly prominent subcarinal lymph nodes are also stable. No other adenopathy. No mediastinal masses. No hilar masses or enlarged lymph nodes. Lungs/Pleura: No evidence of pneumonia or pulmonary edema. There is a peripheral 5 mm nodule in the right upper lobe centered on image 21, series 6. This appears slightly larger than on the most recent prior study when measured 4 mm and slightly larger than it was on a CT dated 03/25/2014. It is convincingly larger than it was on the CT dated 06/16/2012 where it measured 2 mm. No new lung nodules. There is chronic scarring in the posterior central left upper lobe with deviation of the oblique fissure anteriorly. Bronchial wall thickening with subsegmental atelectasis is noted in both lower lobes. Opacity in the medial right middle lobe and at the base of the left upper lobe lingula is consistent with scarring or chronic atelectasis, stable. No pleural effusion.  No pneumothorax. Upper Abdomen: No acute findings. Multiple gallstones. Left adrenal nodularity, stable. Musculoskeletal: No fracture or acute finding. No osteoblastic or  osteolytic lesions. Review of the MIP images confirms the above findings. IMPRESSION: 1. No evidence of a pulmonary embolism. 2. Lower lobe bronchial wall thickening with mild subsegmental atelectasis. This could reflect acute bronchial infection or inflammation. 3. No evidence of  pneumonia or pulmonary edema. 4. 5 mm right upper lobe pulmonary nodule. This appears minimally larger than on the most recent prior chest CT, but has clearly enlarged when dating back to a chest CT dated 06/16/2012. This still may reflect a benign lung nodule. Low-grade neoplastic disease is possible. Recommend follow-up chest CT in 6 months to reassess. 5. Stable scarring in the central left upper lobe. Electronically Signed   By: Lajean Manes M.D.   On: 08/21/2016 13:56     Assessment & Plan:   Chronic respiratory failure with hypoxia Compensated on oxygen. Continue on 4 L to keep O2 saturations greater than 88%  Chronic obstructive pulmonary disease ([place]) Reason exacerbation, now resolving  Plan  Patient Instructions  Continue on BREO and Spiriva , rinse after use.  Continue on Oxygen 4l/m .  Mucinex DM Twice daily  As needed  Cough/congestion  Follow up Dr. Chase Caller 3 months with PFT .  Please contact office for sooner follow up if symptoms do not improve or worsen or seek emergency care         Rexene Edison, NP 08/28/2016

## 2016-08-28 NOTE — Assessment & Plan Note (Signed)
Reason exacerbation, now resolving  Plan  Patient Instructions  Continue on BREO and Spiriva , rinse after use.  Continue on Oxygen 4l/m .  Mucinex DM Twice daily  As needed  Cough/congestion  Follow up Dr. Chase Caller 3 months with PFT .  Please contact office for sooner follow up if symptoms do not improve or worsen or seek emergency care

## 2016-08-28 NOTE — Addendum Note (Signed)
Addended by: Parke Poisson E on: 08/28/2016 10:17 AM   Modules accepted: Orders

## 2016-08-28 NOTE — Patient Instructions (Signed)
Continue on BREO and Spiriva , rinse after use.  Continue on Oxygen 4l/m .  Mucinex DM Twice daily  As needed  Cough/congestion  Follow up Dr. Chase Caller 3 months with PFT .  Please contact office for sooner follow up if symptoms do not improve or worsen or seek emergency care

## 2016-11-25 ENCOUNTER — Ambulatory Visit: Payer: Medicare Other | Admitting: Internal Medicine

## 2016-11-25 ENCOUNTER — Ambulatory Visit (INDEPENDENT_AMBULATORY_CARE_PROVIDER_SITE_OTHER): Payer: Medicare Other | Admitting: Internal Medicine

## 2016-11-25 ENCOUNTER — Encounter: Payer: Self-pay | Admitting: Internal Medicine

## 2016-11-25 VITALS — BP 102/68 | HR 105 | Ht 69.0 in | Wt 231.0 lb

## 2016-11-25 DIAGNOSIS — J449 Chronic obstructive pulmonary disease, unspecified: Secondary | ICD-10-CM

## 2016-11-25 DIAGNOSIS — J9611 Chronic respiratory failure with hypoxia: Secondary | ICD-10-CM

## 2016-11-25 DIAGNOSIS — R911 Solitary pulmonary nodule: Secondary | ICD-10-CM

## 2016-11-25 LAB — PULMONARY FUNCTION TEST
DL/VA % PRED: 51 %
DL/VA: 2.33 ml/min/mmHg/L
DLCO COR: 12.09 ml/min/mmHg
DLCO UNC % PRED: 41 %
DLCO cor % pred: 39 %
DLCO unc: 12.81 ml/min/mmHg
FEF 25-75 PRE: 0.31 L/s
FEF 25-75 Post: 0.46 L/sec
FEF2575-%CHANGE-POST: 46 %
FEF2575-%PRED-POST: 18 %
FEF2575-%PRED-PRE: 12 %
FEV1-%Change-Post: 17 %
FEV1-%Pred-Post: 42 %
FEV1-%Pred-Pre: 36 %
FEV1-Post: 1.37 L
FEV1-Pre: 1.17 L
FEV1FVC-%CHANGE-POST: 14 %
FEV1FVC-%Pred-Pre: 56 %
FEV6-%CHANGE-POST: 12 %
FEV6-%Pred-Post: 62 %
FEV6-%Pred-Pre: 55 %
FEV6-PRE: 2.26 L
FEV6-Post: 2.54 L
FEV6FVC-%Change-Post: 9 %
FEV6FVC-%PRED-PRE: 85 %
FEV6FVC-%Pred-Post: 93 %
FVC-%Change-Post: 2 %
FVC-%Pred-Post: 66 %
FVC-%Pred-Pre: 65 %
FVC-PRE: 2.8 L
FVC-Post: 2.88 L
POST FEV1/FVC RATIO: 47 %
PRE FEV1/FVC RATIO: 42 %
Post FEV6/FVC ratio: 88 %
Pre FEV6/FVC Ratio: 81 %
RV % pred: 156 %
RV: 3.69 L
TLC % pred: 97 %
TLC: 6.68 L

## 2016-11-25 NOTE — Patient Instructions (Signed)
Chronic respiratory failure with hypoxia (HCC) Stage 3 severe COPD by GOLD classification ([place])  - stable disease  - Continue Spiriva and Brio but go up on the Brio dose because pulmonary function test shows bronchodilator response - Continue oxygen 4 L/m -Respect position to decline in lung transplant evaluation - Continue exercise through physical work - Flu shot in the fall recommend high-dose - Please talk to PCP Chesley Noon, MD -  and ensure you get  shingarix vaccine   Nodule of apex of right lung 59mm - seen on CT scan of the chest May 2018; it is enlarged compared to 2014 but same since December 2017 We will repeat CT scan of the chest in 6 months  Follow-up - In 6 months do CT scan of the chest 6 months or sooner if needed

## 2016-11-25 NOTE — Progress Notes (Signed)
PFT done today. 

## 2016-11-25 NOTE — Progress Notes (Signed)
Subjective:     Patient ID: Cody [name], male   DOB: [birth year], 66 y.o.   MRN: [id number]  HPI     OV 11/25/2016  Chief Complaint  Patient presents with  . Follow-up    Pt here after PFT. Pt states his breathing is unchanged since last OV. Pt states he has an occassional cough. Pt denies CP/tightness and f/c/s.   Follow-up advanced COPD chronic hypoxic respiratory failure: He had pulmonary function test today showed stage III COPD. He has significant bronchodilator response of 17%. He is on Spiriva and low-dose Brio. He feels it really helps him. We discussed lung transportation he declined evaluation. 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 His increase his dose on antidepressants he feels more optimistic now. He feels he canlive another 5-10 years with COPD and continue the same.   Follow-up lung nodule: May 2018 he ruled out pulmonary embolism. During this time incidental right upper lobe lung nodule 5 mm was discovered. It is enlarged since 2014 but same since 2017 December.  Results for Cody [name], Cody [name] (MRN [id number]) as of 11/25/2016 12:12  Ref. Range 11/25/2016 10:12  FEV1-Post Latest Units: L 1.37  FEV1-%Pred-Post Latest Units: % 42  FEV1-%Change-Post Latest Units: % 17  Post FEV1/FVC ratio Latest Units: % 47   Results for Cody [name], Cody [name] (MRN [id number]) as of 11/25/2016 12:12  Ref. Range 11/25/2016 10:12  DLCO cor Latest Units: ml/min/mmHg 12.09  DLCO cor % pred Latest Units: % 39    has a past medical history of Anxiety; Arthritis; Bleeding nose; Blood dyscrasia; COPD (chronic obstructive pulmonary disease) ([place]); Diabetes mellitus without complication ([place]); Erythrocytosis; GERD (gastroesophageal reflux disease); Hemochromatosis; Hyperlipidemia; Hypertension; Neuromuscular disorder ([place]); Oxygen dependent; Pneumonia; Pulmonary embolism ([place]) (05/2012); Rectal bleed; Shortness of breath; Shoulder pain; Stroke [facility]); and Thoracic aortic aneurysm  ([place]).   reports that he quit smoking about 3 years ago. His smoking use included Cigarettes. He has a 50.00 pack-year smoking history. He has never used smokeless tobacco.  Past Surgical History:  Procedure Laterality Date  . GASTROSTOMY N/A 05/19/2012   Procedure: PEG Possible Open Gastrostomy;  Surgeon: Shann Medal, MD;  Location: [place];  Service: General;  Laterality: N/A;  . INSERTION OF VENA CAVA FILTER     to prevent further pumonary embolis  . MULTIPLE TOOTH EXTRACTIONS    . TRACHEOSTOMY  2014   & closure, relative to pneumonia   . VIDEO BRONCHOSCOPY Bilateral 06/03/2012   Procedure: VIDEO BRONCHOSCOPY WITHOUT FLUORO;  Surgeon: Raylene Miyamoto, MD;  Location: [facility];  Service: Cardiopulmonary;  Laterality: Bilateral;    Allergies  Allergen Reactions  . Protamine Anaphylaxis  . Ace Inhibitors Cough  . Heparin Other (See Comments)    Bleeding, into muscles Bleeding, into muscles    Immunization History  Administered Date(s) Administered  . Influenza Split 05/28/2012, 01/13/2014  . Influenza, High Dose Seasonal PF 01/13/2016, 02/13/2016  . Influenza,inj,Quad PF,36+ Mos 05/18/2014  . Pneumococcal Conjugate-13 01/10/2014  . Pneumococcal Polysaccharide-23 04/27/2012, 05/28/2012  . Tdap 10/15/2016    Family History  Problem Relation Age of Onset  . COPD Mother      Current Outpatient Prescriptions:  .  albuterol (PROVENTIL HFA;VENTOLIN HFA) 108 (90 Base) MCG/ACT inhaler, Inhale 2 puffs into the lungs every 6 (six) hours as needed for wheezing or shortness of breath., Disp: 1 Inhaler, Rfl: 5 .  amLODipine (NORVASC) 10 MG tablet, Take 5 mg by mouth every morning. Take 0.5  tablets (5 mg total) by mouth daily., Disp: , Rfl:  .  buPROPion (WELLBUTRIN XL) 150 MG 24 hr tablet, Take 150 mg by mouth daily., Disp: , Rfl:  .  fluticasone furoate-vilanterol (BREO ELLIPTA) 100-25 MCG/INH AEPB, Inhale 1 puff into the lungs daily., Disp: 60 each, Rfl: 5 .   hydrochlorothiazide (HYDRODIURIL) 12.5 MG tablet, Take 2 tablets (25 mg total) by mouth daily., Disp: 90 tablet, Rfl: 3 .  ibuprofen (ADVIL,MOTRIN) 200 MG tablet, Take 600 mg by mouth every 8 (eight) hours as needed. , Disp: , Rfl:  .  losartan (COZAAR) 50 MG tablet, Take by mouth., Disp: , Rfl:  .  metFORMIN (GLUCOPHAGE) 500 MG tablet, Take by mouth 2 (two) times daily with a meal., Disp: , Rfl:  .  tiotropium (SPIRIVA) 18 MCG inhalation capsule, Place 1 capsule (18 mcg total) into inhaler and inhale daily., Disp: 30 capsule, Rfl: 12    Review of Systems     Objective:   Physical Exam  Constitutional: He is oriented to person, place, and time. He appears well-developed and well-nourished. No distress.  HENT:  Head: Normocephalic and atraumatic.  Right Ear: External ear normal.  Left Ear: External ear normal.  Mouth/Throat: Oropharynx is clear and moist. No oropharyngeal exudate.  02 on  Eyes: Pupils are equal, round, and reactive to light. Conjunctivae and EOM are normal. Right eye exhibits no discharge. Left eye exhibits no discharge. No scleral icterus.  Neck: Normal range of motion. Neck supple. No JVD present. No tracheal deviation present. No thyromegaly present.  Cardiovascular: Normal rate, regular rhythm and intact distal pulses.  Exam reveals no gallop and no friction rub.   No murmur heard. Pulmonary/Chest: Effort normal and breath sounds normal. No respiratory distress. He has no wheezes. He has no rales. He exhibits no tenderness.  Abdominal: Soft. Bowel sounds are normal. He exhibits no distension and no mass. There is no tenderness. There is no rebound and no guarding.  Visceral obesity +  Musculoskeletal: Normal range of motion. He exhibits no edema or tenderness.  Lymphadenopathy:    He has no cervical adenopathy.  Neurological: He is alert and oriented to person, place, and time. He has normal reflexes. No cranial nerve deficit. Coordination normal.  Skin: Skin is  warm and dry. No rash noted. He is not diaphoretic. No erythema. No pallor.  Psychiatric: He has a normal mood and affect. His behavior is normal. Judgment and thought content normal.  Nursing note and vitals reviewed.  Vitals:   11/25/16 1139  BP: 102/68  Pulse: (!) 105  SpO2: 94%  Weight: 231 lb (104.8 kg)  Height: 5\' 9"  (1.753 m)    Estimated body mass index is 34.11 kg/m as calculated from the following:   Height as of this encounter: 5\' 9"  (1.753 m).   Weight as of this encounter: 231 lb (104.8 kg).     Assessment:       ICD-10-CM   1. Chronic respiratory failure with hypoxia (HCC) J96.11   2. Stage 3 severe COPD by GOLD classification ([place]) J44.9   3. Nodule of apex of right lung R91.1        Plan:     Chronic respiratory failure with hypoxia (HCC) Stage 3 severe COPD by GOLD classification ([place])  - stable disease  - Continue Spiriva and Brio but go up on the Brio dose because pulmonary function test shows bronchodilator response - Continue oxygen 4 L/m -Respect position to decline in lung transplant evaluation -  Continue exercise through physical work - Flu shot in the fall recommend high-dose - Please talk to PCP Chesley Noon, MD -  and ensure you get  shingarix vaccine   Nodule of apex of right lung 78mm - seen on CT scan of the chest May 2018; it is enlarged compared to 2014 but same since December 2017 We will repeat CT scan of the chest in 6 months  Follow-up - In 6 months do CT scan of the chest 6 months or sooner if needed   > 50% of this > 25 min visit spent in face to face counseling or coordination of care    Dr. Brand Males, M.D., [facility]C.P Pulmonary and Critical Care Medicine Staff Physician [facility] Pager: [phone], If no answer or between  15:00h - 7:00h: call [phone]  11/25/2016 12:26 PM

## 2016-11-29 ENCOUNTER — Other Ambulatory Visit: Payer: Self-pay | Admitting: Cardiothoracic Surgery

## 2016-11-29 DIAGNOSIS — I712 Thoracic aortic aneurysm, without rupture, unspecified: Secondary | ICD-10-CM

## 2016-12-25 ENCOUNTER — Encounter: Payer: Self-pay | Admitting: Cardiothoracic Surgery

## 2016-12-25 ENCOUNTER — Ambulatory Visit (INDEPENDENT_AMBULATORY_CARE_PROVIDER_SITE_OTHER): Payer: Medicare Other | Admitting: Cardiothoracic Surgery

## 2016-12-25 ENCOUNTER — Ambulatory Visit
Admission: RE | Admit: 2016-12-25 | Discharge: 2016-12-25 | Disposition: A | Discharge status: Home / Self Care | Payer: Medicare Other | Source: Ambulatory Visit | Attending: Cardiothoracic Surgery | Admitting: Cardiothoracic Surgery

## 2016-12-25 VITALS — BP 140/90 | HR 95 | Resp 20 | Ht 69.0 in | Wt 233.0 lb

## 2016-12-25 DIAGNOSIS — I7121 Aneurysm of the ascending aorta, without rupture: Secondary | ICD-10-CM

## 2016-12-25 DIAGNOSIS — I712 Thoracic aortic aneurysm, without rupture, unspecified: Secondary | ICD-10-CM

## 2016-12-25 MED ORDER — IOPAMIDOL (ISOVUE-370) INJECTION 76%
75.0000 mL | Freq: Once | INTRAVENOUS | Status: AC | PRN
  Administered 2016-12-25: 75 mL via INTRAVENOUS

## 2016-12-25 NOTE — Progress Notes (Signed)
PCP is Chesley Noon, MD Referring Provider is Chesley Noon, MD  Chief Complaint  Patient presents with  . Thoracic Aortic Aneurysm    18 month f/u with CTA Chest    HPI: Patient returns with CTA of the thoracic aorta for 18 month follow-up of a known fusiform ascending aneurysm, stable and a symptomatic since 2015. The patient has a past history of smoking, alcohol abuse. He is hypertensive and is on amlodipine and losartan. \The patient has severe COPD and is followed by Dr. Chase Caller and is on home oxygen now up to 4 L.  The patient denies any chest pain. He feels his blood pressure is well-controlled with his current meds.  Past Medical History:  Diagnosis Date  . Anxiety    panic attacks  . Arthritis    shoulders, neck   . Bleeding nose    history- 1998?, hosp. for for 1 week, per pt.   . Blood dyscrasia    response to heparin, protamine- see note in allergy category   . COPD (chronic obstructive pulmonary disease) ([place])   . Diabetes mellitus without complication ([place])   . Erythrocytosis    felt related to smoking; requires periodic phlebotomy (HEM: Dr. Para March)  . GERD (gastroesophageal reflux disease)   . Hemochromatosis   . Hyperlipidemia   . Hypertension   . Neuromuscular disorder (HCC)    numbness in toes in R foot, " back" , spine neurology   . Oxygen dependent   . Pneumonia    H1N1- admitted in respiratory failure in ED- 04/2012   . Pulmonary embolism ([place]) 05/2012   filter for blood clots  . Rectal bleed   . Shortness of breath   . Shoulder pain    left  . 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 Thoracic aortic aneurysm (HCC)    4.4 cm ascending thoracic aortic aneurysm by CT 09/15/12    Past Surgical History:  Procedure Laterality Date  . GASTROSTOMY N/A 05/19/2012   Procedure: PEG Possible Open Gastrostomy;  Surgeon: Shann Medal, MD;  Location: [place];  Service: General;  Laterality:  N/A;  . INSERTION OF VENA CAVA FILTER     to prevent further pumonary embolis  . MULTIPLE TOOTH EXTRACTIONS    . TRACHEOSTOMY  2014   & closure, relative to pneumonia   . VIDEO BRONCHOSCOPY Bilateral 06/03/2012   Procedure: VIDEO BRONCHOSCOPY WITHOUT FLUORO;  Surgeon: Raylene Miyamoto, MD;  Location: [facility];  Service: Cardiopulmonary;  Laterality: Bilateral;    Family History  Problem Relation Age of Onset  . COPD Mother     Social History Social History  Substance Use Topics  . Smoking status: Former Smoker    Packs/day: 1.00    Years: 50.00    Types: Cigarettes    Quit date: 11/06/2013  . Smokeless tobacco: Never Used     Comment: pt did quit back in march 2014-12/16/12  . Alcohol use No     Comment: recovering alcoholic- x 14 yrs., but still drinks on occasion - beer & wine     Current Outpatient Prescriptions  Medication Sig Dispense Refill  . albuterol (PROVENTIL HFA;VENTOLIN HFA) 108 (90 Base) MCG/ACT inhaler Inhale 2 puffs into the lungs every 6 (six) hours as needed for wheezing or shortness of breath. 1 Inhaler 5  . amLODipine (NORVASC) 10 MG tablet Take 5 mg by mouth every morning. Take  0.5 tablets (5 mg total) by mouth daily.    Marland Kitchen buPROPion (WELLBUTRIN XL) 150 MG 24 hr tablet Take 150 mg by mouth daily.    Marland Kitchen FLUoxetine (PROZAC) 20 MG tablet Take 20 mg by mouth daily.    . fluticasone furoate-vilanterol (BREO ELLIPTA) 100-25 MCG/INH AEPB Inhale 1 puff into the lungs daily. 60 each 5  . hydrochlorothiazide (HYDRODIURIL) 12.5 MG tablet Take 2 tablets (25 mg total) by mouth daily. 90 tablet 3  . ibuprofen (ADVIL,MOTRIN) 200 MG tablet Take 600 mg by mouth every 8 (eight) hours as needed.     Marland Kitchen losartan (COZAAR) 50 MG tablet Take by mouth.    . metFORMIN (GLUCOPHAGE) 500 MG tablet Take by mouth 2 (two) times daily with a meal.    . tiotropium (SPIRIVA) 18 MCG inhalation capsule Place 1 capsule (18 mcg total) into inhaler and inhale daily. 30 capsule 12   No current  facility-administered medications for this visit.     Allergies  Allergen Reactions  . Protamine Anaphylaxis  . Ace Inhibitors Cough  . Heparin Other (See Comments)    Bleeding, into muscles Bleeding, into muscles    Review of Systems   The patient has been experienced increasing depression and is currently on Wellbutrin and Prozac. His activity is limited because of his COPD and home oxygen. No falls or syncope No change in bowel habits or abdominal pain He has poor dentition and is in the process of having dental extractions No change in weight despite trying to follow a healthy diet  BP 140/90   Pulse 95   Resp 20   Ht 5\' 9"  (1.753 m)   Wt 233 lb (105.7 kg)   SpO2 93% Comment: 4L O2 per [place]  BMI 34.41 kg/m  Physical Exam      Exam    General- alert and comfortable-wearing 4 L oxygen nasal cannula   Lungs- distant with scattered rhonchi   Cor- regular rate and rhythm, no murmur , gallop   Abdomen- soft, non-tender   Extremities - warm, non-tender, minimal edema   Neuro- oriented, appropriate, no focal weakness   Diagnostic Tests: CTA shows stable fusiform 4.5 cm aneurysm No evidence of hematoma or penetrating ulceration. Arch and descending thoracic aorta diameters are normal.  Impression: Stable fusiform ascending aneurysm At 4.5 cm the risk for dissection is less than 1% per year. Best therapy is blood pressure control and heart healthy lifestyle  Plan: Return with surveillance CT scan in 18 months. He'll report any new chest pains or upper back pain for evaluation.  Len Childs, MD [facility] [phone]

## 2017-01-21 ENCOUNTER — Telehealth: Payer: Self-pay | Admitting: Internal Medicine

## 2017-01-21 NOTE — Telephone Encounter (Signed)
Pt was advised at 11/2016 follow up visit to do CT chest in 6 months. Pt recently had a CT angio in 12/2016. Per MR - ok to hold off on CT chest until after follow up visit to re-assess when next CT chest will be. Spoke with Vallarie Mare, [facility], she is aware. Nothing further needed at this time.   Will send to MR as FYI.

## 2017-03-24 ENCOUNTER — Other Ambulatory Visit: Payer: Self-pay | Admitting: Internal Medicine

## 2017-08-14 ENCOUNTER — Ambulatory Visit (INDEPENDENT_AMBULATORY_CARE_PROVIDER_SITE_OTHER): Payer: Medicare Other | Admitting: Internal Medicine

## 2017-08-14 ENCOUNTER — Encounter: Payer: Self-pay | Admitting: Internal Medicine

## 2017-08-14 VITALS — BP 114/74 | HR 107 | Ht 69.0 in | Wt 224.8 lb

## 2017-08-14 DIAGNOSIS — J9611 Chronic respiratory failure with hypoxia: Secondary | ICD-10-CM

## 2017-08-14 DIAGNOSIS — R0602 Shortness of breath: Secondary | ICD-10-CM | POA: Diagnosis not present

## 2017-08-14 DIAGNOSIS — R911 Solitary pulmonary nodule: Secondary | ICD-10-CM | POA: Diagnosis not present

## 2017-08-14 DIAGNOSIS — J449 Chronic obstructive pulmonary disease, unspecified: Secondary | ICD-10-CM | POA: Diagnosis not present

## 2017-08-14 NOTE — Progress Notes (Signed)
Subjective:     Patient ID: Cody [name], male   DOB: December 19, 1950, 67 y.o.   MRN: [id number]  HPI   OV 11/25/2016  Chief Complaint  Patient presents with  . Follow-up    Pt here after PFT. Pt states his breathing is unchanged since last OV. Pt states he has an occassional cough. Pt denies CP/tightness and f/c/s.   Follow-up advanced COPD chronic hypoxic respiratory failure: He had pulmonary function test today showed stage III COPD. He has significant bronchodilator response of 17%. He is on Spiriva and low-dose Brio. He feels it really helps him. We discussed lung transportation he declined evaluation. 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 His increase his dose on antidepressants he feels more optimistic now. He feels he canlive another 5-10 years with COPD and continue the same.   Follow-up lung nodule: May 2018 he ruled out pulmonary embolism. During this time incidental right upper lobe lung nodule 5 mm was discovered. It is enlarged since 2014 but same since 2017 December.   OV 08/14/2017  Chief Complaint  Patient presents with  . Follow-up    Pt has complaints of SOB that is worse with exertion. Denies any cough or CP.    Follow-up advanced COPD with chronic hypoxemic restaurant failure: He is on triple inhaler therapy with Spiriva and Brio.  No symptoms COPD exacerbation since last visit.  He is doing great.  He supports his granddaughters.  1 of them does not want to work the other one is graduating tomorrow from college.  Another one has 1 more year to go.  He is slowly retiring.  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  He does not want transplant.  He continues on oxygen.  He is asking about Trelegy but after discussion decided to defer it.  5 mm right apical lung nodule 5 mm seen in May 2018 with growth since 2014: He did have a CT for cardiac reasons in September 2018 and this  nodule was deemed stable.  There are no other issues.  Results for Cody [name], Cody [name] (MRN [id number]) as of 11/25/2016 12:12  Ref. Range 11/25/2016 10:12  FEV1-Post Latest Units: L 1.37  FEV1-%Pred-Post Latest Units: % 42  FEV1-%Change-Post Latest Units: % 17  Post FEV1/FVC ratio Latest Units: % 47   Results for Cody [name], Cody [name] (MRN [id number]) as of 11/25/2016 12:12  Ref. Range 11/25/2016 10:12  DLCO cor Latest Units: ml/min/mmHg 12.09  DLCO cor % pred Latest Units: % 39      has a past medical history of Anxiety, Arthritis, Bleeding nose, Blood dyscrasia, COPD (chronic obstructive pulmonary disease) ([place]), Diabetes mellitus without complication ([place]), Erythrocytosis, GERD (gastroesophageal reflux disease), Hemochromatosis, Hyperlipidemia, Hypertension, Neuromuscular disorder ([place]), Oxygen dependent, Pneumonia, Pulmonary embolism ([place]) (05/2012), Rectal bleed, Shortness of breath, Shoulder pain, Stroke ([place]), and Thoracic aortic aneurysm ([place]).   reports that he quit smoking about 3 years ago. His smoking use included cigarettes. He has a 50.00 pack-year smoking history. He has never used smokeless tobacco.  Past Surgical History:  Procedure Laterality Date  . GASTROSTOMY N/A 05/19/2012   Procedure: PEG Possible Open Gastrostomy;  Surgeon: Shann Medal, MD;  Location: [facility];  Service: General;  Laterality: N/A;  . INSERTION OF VENA CAVA FILTER     to prevent further pumonary embolis  . MULTIPLE TOOTH EXTRACTIONS    . TRACHEOSTOMY  2014   & closure, relative to pneumonia   . VIDEO BRONCHOSCOPY Bilateral 06/03/2012   Procedure: VIDEO BRONCHOSCOPY WITHOUT FLUORO;  Surgeon: Raylene Miyamoto, MD;  Location: [facility] [place];  Service: Cardiopulmonary;  Laterality: Bilateral;    Allergies  Allergen Reactions  . Protamine Anaphylaxis  . Ace Inhibitors Cough  . Heparin Other (See Comments)    Bleeding, into muscles Bleeding, into muscles    Immunization History  Administered Date(s)  Administered  . Influenza Split 05/28/2012, 01/13/2014  . Influenza, High Dose Seasonal PF 01/13/2016, 02/13/2016, 02/03/2017  . Influenza,inj,Quad PF,6+ Mos 05/18/2014  . Pneumococcal Conjugate-13 01/10/2014  . Pneumococcal Polysaccharide-23 04/27/2012, 05/28/2012  . Tdap 10/15/2016    Family History  Problem Relation Age of Onset  . COPD Mother      Current Outpatient Medications:  .  albuterol (PROVENTIL HFA;VENTOLIN HFA) 108 (90 Base) MCG/ACT inhaler, Inhale 2 puffs into the lungs every 6 (six) hours as needed for wheezing or shortness of breath., Disp: 1 Inhaler, Rfl: 5 .  amLODipine (NORVASC) 10 MG tablet, Take 5 mg by mouth every morning. Take 0.5 tablets (5 mg total) by mouth daily., Disp: , Rfl:  .  BREO ELLIPTA 100-25 MCG/INH AEPB, INHALE 1 PUFF BY MOUTH ONCE DAILY, Disp: 60 each, Rfl: 5 .  buPROPion (WELLBUTRIN XL) 300 MG 24 hr tablet, , Disp: , Rfl: 2 .  FLUoxetine (PROZAC) 20 MG tablet, Take 20 mg by mouth daily., Disp: , Rfl:  .  hydrochlorothiazide (HYDRODIURIL) 12.5 MG tablet, Take 2 tablets (25 mg total) by mouth daily., Disp: 90 tablet, Rfl: 3 .  ibuprofen (ADVIL,MOTRIN) 200 MG tablet, Take 600 mg by mouth every 8 (eight) hours as needed. , Disp: , Rfl:  .  metFORMIN (GLUCOPHAGE) 500 MG tablet, Take by mouth 2 (two) times daily with a meal., Disp: , Rfl:  .  telmisartan (MICARDIS) 40 MG tablet, , Disp: , Rfl: 0 .  tiotropium (SPIRIVA) 18 MCG inhalation capsule, Place 1 capsule (18 mcg total) into inhaler and inhale daily., Disp: 30 capsule, Rfl: 12     Review of Systems     Objective:   Physical Exam Vitals:   08/14/17 1429  BP: 114/74  Pulse: (!) 107  SpO2: 95%  Weight: 224 lb 12.8 oz (102 kg)  Height: 5\' 9"  (1.753 m)     Estimated body mass index is 33.2 kg/m as calculated from the following:   Height as of this encounter: 5\' 9"  (1.753 m).   Weight as of this encounter: 224 lb 12.8 oz (102 kg).   General Appearance:    l OBESE - +  Head:     Normocephalic, without obvious abnormality, atraumatic  Eyes:    PERRL - yes, conjunctiva/corneas - clea      Ears:    Normal external ear canals, both ears  Nose:   NG tube - no but has [place]  Throat:  ETT TUBE - no , OG tube - no  Neck:   Supple,  No enlargement/tenderness/nodules     Lungs:     Clear to auscultation bilaterally,   Chest wall:    No deformity  Heart:    S1 and S2 normal, no murmur, CVP - no.  Pressors - no  Abdomen:     Soft, no masses, no organomegaly. OBESE  Genitalia:    Not done  Rectal:   not done  Extremities:   Extremities- none     Skin:   Intact in exposed areas .  Neurologic:   Sedation - none -> RASS - na . Moves all 4s - yes. CAM-ICU - neg . Orientation - x 3+        Assessment:       ICD-10-CM   1. Chronic respiratory failure with hypoxia (HCC) J96.11   2. Stage 3 severe COPD by GOLD classification ([place]) J44.9   3. Nodule of apex of right lung R91.1        Plan:      Chronic respiratory failure with hypoxia (HCC) Stage 3 severe COPD by GOLD classification ([place])  0-stable disease  - discussed trelegy but for now continue spiriva and breo daily with scheduled o2 4L [place] - flu shot in fall  Nodule of apex of right lung - seen 64mm in May 2018 and larger since 2014 - repeat CT chest without contrast in Oct 2019  Followup  - Oct 2019 after CT; sooner if needed.     Dr. Brand Males, M.D., [facility].C.P Pulmonary and Critical Care Medicine Staff Physician, [place] Director - Interstitial Lung Disease  Program  Pulmonary [place] at [place], [place], [address]  Pager: [phone], If no answer or between  15:00h - 7:00h: call [phone] Telephone: [phone]

## 2017-08-14 NOTE — Addendum Note (Signed)
Addended by: Lorretta Harp on: 08/14/2017 03:24 PM   Modules accepted: Orders

## 2017-08-14 NOTE — Patient Instructions (Addendum)
ICD-10-CM   1. Chronic respiratory failure with hypoxia (HCC) J96.11   2. Stage 3 severe COPD by GOLD classification ([place]) J44.9   3. Nodule of apex of right lung R91.1    Chronic respiratory failure with hypoxia (HCC) Stage 3 severe COPD by GOLD classification ([employer])  0-stable disease  - discussed trelegy but for now continue spiriva and breo daily with scheduled o2 4L  - flu shot in fall  Nodule of apex of right lung - seen 67mm in May 2018 and larger since 2014 - repeat CT chest without contrast in Oct 2019  Followup  - Oct 2019 after CT; sooner if needed.

## 2017-12-17 ENCOUNTER — Other Ambulatory Visit: Payer: Self-pay | Admitting: Internal Medicine

## 2018-01-13 ENCOUNTER — Ambulatory Visit (INDEPENDENT_AMBULATORY_CARE_PROVIDER_SITE_OTHER)
Admission: RE | Admit: 2018-01-13 | Discharge: 2018-01-13 | Disposition: A | Discharge status: Home / Self Care | Payer: Medicare Other | Source: Ambulatory Visit | Attending: Internal Medicine | Admitting: Internal Medicine

## 2018-01-13 DIAGNOSIS — J9611 Chronic respiratory failure with hypoxia: Secondary | ICD-10-CM

## 2018-01-16 ENCOUNTER — Ambulatory Visit (INDEPENDENT_AMBULATORY_CARE_PROVIDER_SITE_OTHER): Payer: Medicare Other | Admitting: Internal Medicine

## 2018-01-16 ENCOUNTER — Encounter: Payer: Self-pay | Admitting: Internal Medicine

## 2018-01-16 VITALS — BP 112/72 | HR 83 | Ht 69.0 in | Wt 217.2 lb

## 2018-01-16 DIAGNOSIS — R911 Solitary pulmonary nodule: Secondary | ICD-10-CM | POA: Diagnosis not present

## 2018-01-16 DIAGNOSIS — I712 Thoracic aortic aneurysm, without rupture, unspecified: Secondary | ICD-10-CM

## 2018-01-16 DIAGNOSIS — J449 Chronic obstructive pulmonary disease, unspecified: Secondary | ICD-10-CM

## 2018-01-16 DIAGNOSIS — J9611 Chronic respiratory failure with hypoxia: Secondary | ICD-10-CM

## 2018-01-16 NOTE — Addendum Note (Signed)
Addended by: Madolyn Frieze on: 01/16/2018 10:38 AM   Modules accepted: Orders

## 2018-01-16 NOTE — Patient Instructions (Addendum)
ICD-10-CM   1. Stage 3 severe COPD by GOLD classification ([place]) J44.9   2. Chronic respiratory failure with hypoxia (HCC) J96.11   3. Nodule of apex of right lung R91.1   4. Thoracic aortic aneurysm without rupture (HCC) I71.2    Stage 3 severe COPD by GOLD classification ([place]) Chronic respiratory failure with hypoxia (HCC)  - stable  -do walk test in our office right now on room air - continue Breo and Spiriva as scheduled with albuterol as needed --Continue daily oxygen -Recommended high-dose flu shot but you refused and will have it outside next month  Nodule of apex of right lung - seen 7mm in May 2018 and larger since 2014  - no change in Oct 2019 - repeat CT chest without contrast in 12 months  Thoracic aortic aneurysm without rupture (HCC) deteriorated -= Currently 4.7 cm on CT scan October 2019 -Refer to Dr. Dahlia Byes   Follow-up 6 months or sooner if needed - will order the repeat cT at that time

## 2018-01-16 NOTE — Progress Notes (Signed)
HPI   OV 11/25/2016  Chief Complaint  Patient presents with  . Follow-up    Pt here after PFT. Pt states his breathing is unchanged since last OV. Pt states he has an occassional cough. Pt denies CP/tightness and f/c/s.   Follow-up advanced COPD chronic hypoxic respiratory failure: He had pulmonary function test today showed stage III COPD. He has significant bronchodilator response of 17%. He is on Spiriva and low-dose Brio. He feels it really helps him. We discussed lung transportation he declined evaluation. 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 His increase his dose on antidepressants he feels more optimistic now. He feels he canlive another 5-10 years with COPD and continue the same.   Follow-up lung nodule: May 2018 he ruled out pulmonary embolism. During this time incidental right upper lobe lung nodule 5 mm was discovered. It is enlarged since 2014 but same since 2017 December.   OV 08/14/2017  Chief Complaint  Patient presents with  . Follow-up    Pt has complaints of SOB that is worse with exertion. Denies any cough or CP.    Follow-up advanced COPD with chronic hypoxemic restaurant failure: He is on triple inhaler therapy with Spiriva and Brio.  No symptoms COPD exacerbation since last visit.  He is doing great.  He supports his granddaughters.  1 of them does not want to work the other one is graduating tomorrow from college.  Another one has 1 more year to go.  He is slowly retiring.  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  He does not want transplant.  He continues on oxygen.  He is asking about Trelegy but after discussion decided to defer it.  5 mm right apical lung nodule 5 mm seen in May 2018 with growth since 2014: He did have a CT for cardiac reasons in September 2018 and this nodule was deemed stable.  There are no other issues.  Results for KEYONDRE, HEPBURN (MRN  [id number]) as of 11/25/2016 12:12  Ref. Range 11/25/2016 10:12  FEV1-Post Latest Units: L 1.37  FEV1-%Pred-Post Latest Units: % 42  FEV1-%Change-Post Latest Units: % 17  Post FEV1/FVC ratio Latest Units: % 47   Results for KHYRIN, TREVATHAN (MRN [id number]) as of 11/25/2016 12:12  Ref. Range 11/25/2016 10:12  DLCO cor Latest Units: ml/min/mmHg 12.09  DLCO cor % pred Latest Units: % 39     OV 01/16/2018  Subjective:  Patient ID: Cody [name], male , DOB: [birth year] , age 19 y.o. , MRN: [id number] , ADDRESS: [place] [id number]   01/16/2018 -   Chief Complaint  Patient presents with  . Follow-up    Doing well at this time no changes.     HPI Cody [name] 67 y.o. -advanced COPD with chronic hypoxemic respiratory failure follow-up.  Also right upper lobe lung nodules follow-up.    He reports overall he stable.  He is not quit work.  COPD CAT score is 9 so he is doing well.  CT scan does not show any worsening emphysema or pulmonary fibrosis onset of cancer.  Lung nodule is stable.  He has new finding of worsening thoracic artery aorta aneurysm.  He is asymptomatic.  He has not seen Dr. Dahlia Byes in a year and a half according to his history.  He has not had his flu shot and  says he will have an outside.  He does not want to have it with [place] today.  See him returned his oxygen off given the fact his COPD CAT score is so low.  And his walking desaturation test on room air shows -   Below desats with walking, needs 4L [place] to correct.  However he wants his oxygen for safety and comfort     CAT COPD Symptom & Quality of Life Score ([place] trademark) 0 is no burden. 5 is highest burden 01/16/2018   Never Cough -> Cough all the time 1  No phlegm in chest -> Chest is full of phlegm 1  No chest tightness -> Chest feels very tight 0  No dyspnea for 1 flight stairs/hill -> Very dyspneic for 1 flight of stairs 4  No limitations for ADL at home -> Very limited with ADL at  home 0  Confident leaving home -> Not at all confident leaving home 0  Sleep soundly -> Do not sleep soundly because of lung condition 0  Lots of Energy -> No energy at all 3  TOTAL Score (max 40)  9    Simple office walk 185 feet x  3 laps goal with forehead probe 01/16/2018   O2 used  after being on room air for 15 minutes  Number laps completed  1 lap and desaturated  Comments about pace  fast pace  Resting Pulse Ox/HR  92% % and 96/min  Final Pulse Ox/HR  86% % and 104/min -after first lap  Desaturated </= 88%  yes  Desaturated <= 3% points  yes  Got Tachycardic >/= 90/min  yes  Symptoms at end of test  dyspnea  Miscellaneous comments  required 4 L to correct and do 3 laps       ROS - per HPI  IMPRESSION: 1. Stable exam. 2. No change in the 5-6 mm posterior right upper lobe pulmonary nodule. 3. Ascending thoracic aorta measures 4.7 mm compared to 4.5 cm previously. Ascending thoracic aortic aneurysm. Recommend semi-annual imaging followup by CTA or MRA and referral to cardiothoracic surgery if not already obtained. This recommendation follows 2010 ACCF/AHA/AATS/ACR/ASA/SCA/SCAI/SIR/STS/SVM Guidelines for the Diagnosis and Management of Patients With Thoracic Aortic Disease. Circulation. 2010; 121: P102-H852 4.  Aortic Atherosclerois (ICD10-170.0) 5.  Emphysema. (ICD10-J43.9) 6. Stable left adrenal adenoma.   Electronically Signed   By: [name] M.D.   On: 01/13/2018 16:26   has a past medical history of Anxiety, Arthritis, Bleeding nose, Blood dyscrasia, COPD (chronic obstructive pulmonary disease) ([place]), Diabetes mellitus without complication ([place]), Erythrocytosis, GERD (gastroesophageal reflux disease), Hemochromatosis, Hyperlipidemia, Hypertension, Neuromuscular disorder ([place]), Oxygen dependent, Pneumonia, Pulmonary embolism ([place]) (05/2012), Rectal bleed, Shortness of breath, Shoulder pain, Stroke [facility]), and Thoracic aortic aneurysm ([place]).   reports that  he quit smoking about 4 years ago. His smoking use included cigarettes. He has a 50.00 pack-year smoking history. He has never used smokeless tobacco.  Past Surgical History:  Procedure Laterality Date  . GASTROSTOMY N/A 05/19/2012   Procedure: PEG Possible Open Gastrostomy;  Surgeon: Shann Medal, MD;  Location: [place];  Service: General;  Laterality: N/A;  . INSERTION OF VENA CAVA FILTER     to prevent further pumonary embolis  . MULTIPLE TOOTH EXTRACTIONS    . TRACHEOSTOMY  2014   & closure, relative to pneumonia   . VIDEO BRONCHOSCOPY Bilateral 06/03/2012   Procedure: VIDEO BRONCHOSCOPY WITHOUT FLUORO;  Surgeon: Raylene Miyamoto, MD;  Location: [facility];  Service: Cardiopulmonary;  Laterality: Bilateral;    Allergies  Allergen Reactions  . Protamine Anaphylaxis  . Ace Inhibitors Cough  . Heparin Other (See Comments)    Bleeding, into muscles Bleeding, into muscles    Immunization History  Administered Date(s) Administered  . Influenza Split 05/28/2012, 01/13/2014  . Influenza, High Dose Seasonal PF 01/13/2016, 02/13/2016, 02/03/2017  . Influenza,inj,Quad PF,6+ Mos 05/18/2014  . Pneumococcal Conjugate-13 01/10/2014  . Pneumococcal Polysaccharide-23 04/27/2012, 05/28/2012  . Tdap 10/15/2016    Family History  Problem Relation Age of Onset  . COPD Mother      Current Outpatient Medications:  .  albuterol (PROVENTIL HFA;VENTOLIN HFA) 108 (90 Base) MCG/ACT inhaler, Inhale 2 puffs into the lungs every 6 (six) hours as needed for wheezing or shortness of breath., Disp: 1 Inhaler, Rfl: 5 .  amLODipine (NORVASC) 10 MG tablet, Take 5 mg by mouth every morning. Take 0.5 tablets (5 mg total) by mouth daily., Disp: , Rfl:  .  BREO ELLIPTA 100-25 MCG/INH AEPB, INHALE 1 PUFF BY MOUTH ONCE DAILY, Disp: 60 each, Rfl: 5 .  buPROPion (WELLBUTRIN XL) 300 MG 24 hr tablet, , Disp: , Rfl: 2 .  hydrochlorothiazide (HYDRODIURIL) 12.5 MG tablet, Take 2 tablets (25 mg total) by mouth  daily., Disp: 90 tablet, Rfl: 3 .  ibuprofen (ADVIL,MOTRIN) 200 MG tablet, Take 600 mg by mouth every 8 (eight) hours as needed. , Disp: , Rfl:  .  metFORMIN (GLUCOPHAGE) 500 MG tablet, Take by mouth 2 (two) times daily with a meal., Disp: , Rfl:  .  tiotropium (SPIRIVA) 18 MCG inhalation capsule, Place 1 capsule (18 mcg total) into inhaler and inhale daily., Disp: 30 capsule, Rfl: 12      Objective:   Vitals:   01/16/18 0904  BP: 112/72  Pulse: 83  SpO2: 94%  Weight: 217 lb 3.2 oz (98.5 kg)  Height: 5\' 9"  (1.753 m)    Estimated body mass index is 32.07 kg/m as calculated from the following:   Height as of this encounter: 5\' 9"  (1.753 m).   Weight as of this encounter: 217 lb 3.2 oz (98.5 kg).  @WEIGHTCHANGE @  [employer]   01/16/18 0904  Weight: 217 lb 3.2 oz (98.5 kg)     Physical Exam  General Appearance:    Alert, cooperative, no distress, appears stated age - older , Deconditioned looking - no , OBESE  - yes, Sitting on Wheelchair -  no  Head:    Normocephalic, without obvious abnormality, atraumatic  Eyes:    PERRL, conjunctiva/corneas clear,  Ears:    Normal TM's and external ear canals, both ears  Nose:   Nares normal, septum midline, mucosa normal, no drainage    or sinus tenderness. OXYGEN ON  - yes . Patient is @ 4L   Throat:   Lips, mucosa, and tongue normal; teeth and gums normal. Cyanosis on lips - no  Neck:   Supple, symmetrical, trachea midline, no adenopathy;    thyroid:  no enlargement/tenderness/nodules; no carotid   bruit or JVD  Back:     Symmetric, no curvature, ROM normal, no CVA tenderness  Lungs:     Distress - no , Wheeze no, Barrell Chest - yes, Purse lip breathing - mild, Crackles - no   Chest Wall:    No tenderness or deformity.    Heart:    Regular rate and rhythm, S1 and S2 normal, no rub   or gallop, Murmur - no  Breast Exam:    NOT DONE  Abdomen:     Soft, non-tender, bowel sounds active all four quadrants,    no masses, no  organomegaly. Visceral obesity - yes  Genitalia:   NOT DONE  Rectal:   NOT DONE  Extremities:   Extremities - normal, Has Cane - no, Clubbing - no, Edema - no  Pulses:   2+ and symmetric all extremities  Skin:   Stigmata of Connective Tissue Disease - no  Lymph nodes:   Cervical, supraclavicular, and axillary nodes normal  Psychiatric:  Neurologic:   Pleasant - yes, Anxious - no, Flat affect - no  CAm-ICU - neg, Alert and Oriented x 3 - yes, Moves all 4s - yes, Speech - normal, Cognition - intact           Assessment:       ICD-10-CM   1. Stage 3 severe COPD by GOLD classification ([place]) J44.9   2. Chronic respiratory failure with hypoxia (HCC) J96.11   3. Nodule of apex of right lung R91.1   4. Thoracic aortic aneurysm without rupture (HCC) I71.2        Plan:     Patient Instructions     ICD-10-CM   1. Stage 3 severe COPD by GOLD classification ([place]) J44.9   2. Chronic respiratory failure with hypoxia (HCC) J96.11   3. Nodule of apex of right lung R91.1   4. Thoracic aortic aneurysm without rupture (HCC) I71.2    Stage 3 severe COPD by GOLD classification ([place]) Chronic respiratory failure with hypoxia (HCC)  - stable  -do walk test in our office right now on room air - continue Breo and Spiriva as scheduled with albuterol as needed --Continue daily oxygen -Recommended high-dose flu shot but you refused and will have it outside next month  Nodule of apex of right lung - seen 29mm in May 2018 and larger since 2014  - no change in Oct 2019  Thoracic aortic aneurysm without rupture [facility]) deteriorated -= Currently 4.7 cm on CT scan October 2019 -Refer to Dr. Dahlia Byes   Follow-up 6 months or sooner if needed      SIGNATURE    Dr. Brand Males, M.D., F.C.C.P,  Pulmonary and Critical Care Medicine Staff Physician, [place] Director - Interstitial Lung Disease  Program  Pulmonary [facility] [place], [place], [address]  Pager: [phone], If no answer or between  15:00h - 7:00h: call [phone] Telephone: [phone]  9:23 AM 01/16/2018

## 2018-01-19 ENCOUNTER — Telehealth: Payer: Self-pay | Admitting: Internal Medicine

## 2018-01-19 NOTE — Telephone Encounter (Signed)
MR, please advise on the below message in regards to the referral that was placed for Dr.Van Trigt. Thanks!  This patient is due to see Dr Prescott Gum in March 2020. Is it ok for patient to wait until March? Dr Lucianne Lei trigt is monitoring the aneurysm. Thanks    ----- Message -----  From: Tana Coast  Sent: 01/16/2018 11:00 AM EDT  To: Ned Clines  Subject: referral order                  This order has been placed by Dr. Chase Caller for the patient to see Dr. Darcey Nora   Thanks,  Rodena Piety

## 2018-01-20 NOTE — Telephone Encounter (Signed)
Micki Riley acknowledged MR's response and stated Thank you

## 2018-01-20 NOTE — Telephone Encounter (Signed)
I have forwarded MR's response to Micki Riley at Dr. Thayer Ohm office. Waiting for her response

## 2018-01-20 NOTE — Telephone Encounter (Signed)
Raquel Sarna please advise, thank you.

## 2018-01-20 NOTE — Telephone Encounter (Signed)
Spoke with Rodena Piety regarding information below. She will forward to the contact at office.

## 2018-01-20 NOTE — Telephone Encounter (Signed)
I think so . Please let his office know that the latest CT shows some increase in size of aneurysm so they can decide but given his severe copd I do not think he is a good operative candidate and so march 2020 should be fine

## 2018-03-25 ENCOUNTER — Ambulatory Visit (INDEPENDENT_AMBULATORY_CARE_PROVIDER_SITE_OTHER): Payer: Medicare Other | Admitting: Cardiothoracic Surgery

## 2018-03-25 ENCOUNTER — Encounter: Payer: Self-pay | Admitting: Cardiothoracic Surgery

## 2018-03-25 ENCOUNTER — Other Ambulatory Visit: Payer: Self-pay

## 2018-03-25 VITALS — BP 138/88 | HR 104 | Resp 18 | Ht 69.0 in | Wt 228.4 lb

## 2018-03-25 DIAGNOSIS — I712 Thoracic aortic aneurysm, without rupture, unspecified: Secondary | ICD-10-CM

## 2018-03-25 NOTE — Progress Notes (Signed)
PCP is Chesley Noon, MD Referring Provider is Chesley Noon, MD  Chief Complaint  Patient presents with  . Thoracic Aortic Aneurysm    f/u with chest CT 01/13/2018    HPI: Patient returns for scheduled office visit with CT scan for surveillance of a 4.5 cm fusiform ascending aneurysm.  This has been asymptomatic.  Patient has high blood pressure on Norvasc 10 mg daily but checks his blood pressure at home at least twice a week.  He denies chest pain.  I reviewed the images of the most recent CT scan of the chest October 2019 and compared to the images of 2018.  The current diameter of the mid ascending thoracic aorta measures 4.7 cm.  Slightly larger than the 4.5 cm in 2018.  However due to the pulsatility the aorta this basically is no significant change and the images appear to be very similar.  The risk for dissection  In this patient remains at less than 1% and should remain low as long as he controls his blood pressure.  Patient has very poor pulmonary reserve on home oxygen, with history of severe emphysema, previous tracheostomy, and poor PFTs.  FEV1 is 1.1 and DLCO is 39% of predicted.  The patient would be very high risk and probably not a candidate for sternotomy and repair of aortic disease.  If he continues to control his blood pressure and monitor blood pressure the risk of further progression of his aortic disease is low.   Past Medical History:  Diagnosis Date  . Anxiety    panic attacks  . Arthritis    shoulders, neck   . Bleeding nose    history- 1998?, hosp. for for 1 week, per pt.   . Blood dyscrasia    response to heparin, protamine- see note in allergy category   . COPD (chronic obstructive pulmonary disease) ([place])   . Diabetes mellitus without complication ([place])   . Erythrocytosis    felt related to smoking; requires periodic phlebotomy (HEM: Dr. Para March)  . GERD (gastroesophageal reflux disease)   . Hemochromatosis   . Hyperlipidemia   .  Hypertension   . Neuromuscular disorder (HCC)    numbness in toes in R foot, " back" , spine neurology   . Oxygen dependent   . Pneumonia    H1N1- admitted in respiratory failure in ED- 04/2012   . Pulmonary embolism ([place]) 05/2012   filter for blood clots  . Rectal bleed   . Shortness of breath   . Shoulder pain    left  . Stroke [facility])    2007, R eye (lower vision - lost ) , carotid studies done recently- clear  . Thoracic aortic aneurysm (HCC)    4.4 cm ascending thoracic aortic aneurysm by CT 09/15/12    Past Surgical History:  Procedure Laterality Date  . GASTROSTOMY N/A 05/19/2012   Procedure: PEG Possible Open Gastrostomy;  Surgeon: Shann Medal, MD;  Location: [place];  Service: General;  Laterality: N/A;  . INSERTION OF VENA CAVA FILTER     to prevent further pumonary embolis  . MULTIPLE TOOTH EXTRACTIONS    . TRACHEOSTOMY  2014   & closure, relative to pneumonia   . VIDEO BRONCHOSCOPY Bilateral 06/03/2012   Procedure: VIDEO BRONCHOSCOPY WITHOUT FLUORO;  Surgeon: Raylene Miyamoto, MD;  Location: [place];  Service: Cardiopulmonary;  Laterality: Bilateral;    Family History  Problem Relation Age of Onset  . COPD Mother  Social History Social History   Tobacco Use  . Smoking status: Former Smoker    Packs/day: 1.00    Years: 50.00    Pack years: 50.00    Types: Cigarettes    Last attempt to quit: 11/06/2013    Years since quitting: 4.3  . Smokeless tobacco: Never Used  . Tobacco comment: pt did quit back in march 2014-12/16/12  Substance Use Topics  . Alcohol use: No    Comment: recovering alcoholic- x 14 yrs., but still drinks on occasion - beer & wine   . Drug use: No    Current Outpatient Medications  Medication Sig Dispense Refill  . albuterol (PROVENTIL HFA;VENTOLIN HFA) 108 (90 Base) MCG/ACT inhaler Inhale 2 puffs into the lungs every 6 (six) hours as needed for wheezing or shortness of breath. 1 Inhaler 5  . amLODipine (NORVASC) 10 MG tablet Take  5 mg by mouth every morning. Take 0.5 tablets (5 mg total) by mouth daily.    Marland Kitchen BREO ELLIPTA 100-25 MCG/INH AEPB INHALE 1 PUFF BY MOUTH ONCE DAILY 60 each 5  . buPROPion (WELLBUTRIN XL) 300 MG 24 hr tablet   2  . hydrochlorothiazide (HYDRODIURIL) 12.5 MG tablet Take 2 tablets (25 mg total) by mouth daily. 90 tablet 3  . ibuprofen (ADVIL,MOTRIN) 200 MG tablet Take 600 mg by mouth every 8 (eight) hours as needed.     . metFORMIN (GLUCOPHAGE) 500 MG tablet Take by mouth 2 (two) times daily with a meal.    . tiotropium (SPIRIVA) 18 MCG inhalation capsule Place 1 capsule (18 mcg total) into inhaler and inhale daily. 30 capsule 12   No current facility-administered medications for this visit.     Allergies  Allergen Reactions  . Protamine Anaphylaxis  . Ace Inhibitors Cough  . Heparin Other (See Comments)    Bleeding, into muscles Bleeding, into muscles    Review of Systems   Patient has some issues with anxiety and depression which are now better on Wellbutrin Patient states his oxygen saturation have slightly diminished over the past year from 94% now down to 91% Patient notes he has gained weight from decreased exercise tolerance up approximately 15 pounds Patient states he has not required rehospitalization since his last visit.  BP 138/88 (BP Location: Left Arm, Patient Position: Sitting, Cuff Size: Large)   Pulse (!) 104   Resp 18   Ht 5\' 9"  (1.753 m)   Wt 228 lb 6.4 oz (103.6 kg)   SpO2 94% Comment: 4LNC  BMI 33.73 kg/m  Physical Exam Obese 67 year old male on oxygen in no distress Coarse bilateral breath sounds Heart rate regular without cardiac murmur No JVD Abdomen obese but soft without pulsatile mass Extremities with minimal edema No neuro deficit  Diagnostic Tests: CT scan images personally reviewed showing minimal change in the moderate fusiform ascending aorta.  Current diameter is 4.7  Impression: Risk for dissection is low Risk for surgical repair is  extremely high if not prohibitive. Continued medical therapy with blood pressure control and continued surveillance CT scan is the best long-term therapy.  Plan: Return in 1 year with CT scan.  Continue current medications.  Continue to monitor blood pressure.  Patient understands his blood pressure should remain less than 010 systolic.  Avoid fluoroquinolone antibiotics which could weaken the connective tissue matrix of the aortic wall.   Len Childs, MD [facility] [phone]

## 2018-10-02 ENCOUNTER — Ambulatory Visit (INDEPENDENT_AMBULATORY_CARE_PROVIDER_SITE_OTHER): Payer: Medicare Other | Admitting: Adult Health

## 2018-10-02 ENCOUNTER — Encounter: Payer: Self-pay | Admitting: Adult Health

## 2018-10-02 ENCOUNTER — Other Ambulatory Visit: Payer: Self-pay

## 2018-10-02 DIAGNOSIS — R911 Solitary pulmonary nodule: Secondary | ICD-10-CM

## 2018-10-02 DIAGNOSIS — J449 Chronic obstructive pulmonary disease, unspecified: Secondary | ICD-10-CM

## 2018-10-02 DIAGNOSIS — J9611 Chronic respiratory failure with hypoxia: Secondary | ICD-10-CM | POA: Diagnosis not present

## 2018-10-02 NOTE — Assessment & Plan Note (Signed)
Continue follow up .  CT chest this fall as planned

## 2018-10-02 NOTE — Patient Instructions (Signed)
Continue on BREO and Spiriva , rinse after use.  Continue on Oxygen 4l/m .  Mucinex DM Twice daily  As needed  Cough/congestion  Follow up Dr. Chase Caller 4 months and As needed   Please contact office for sooner follow up if symptoms do not improve or worsen or seek emergency care

## 2018-10-02 NOTE — Progress Notes (Signed)
@Patient  ID: Cody [name], male    DOB: 24-May-1950, 68 y.o.   MRN: [id number]  Chief Complaint  Patient presents with  . Follow-up    COPD     Referring provider: Chesley Noon, MD  HPI: 68 year old male former smoker followed for severe COPD, oxygen dependent respiratory failure, lung nodules Medical history significant for Critical illness with influenza, aspiration PNA , COPD exeracerbation w/ prolonged hospitalizaton -PE/DVT, Trach   TEST/EVENTS :  CT chest 05/2015 >tiny pulmonary noduels in RUL -scarring in left lung .  CT chest October 2019 no change in right upper lobe pulmonary nodule, ascending thoracic aorta 4.7 cm compared to 4.5 cm  10/02/2018 Follow up : COPD , O2 RF , Lung nodules  Patient presents for a 41-month follow-up.  Patient has underlying severe COPD that is oxygen dependent.  He remains on Breo and Spiriva.  He is on oxygen 4 L.  Says overall he is doing okay.  He is had no flare of cough or wheezing.  Says he tries to stay active.  He plays a garden every year he goes out to the garden also walks down to [employer].  He loves to use [employer].  CT chest in October 2019 showed stable right upper lobe nodule.  He is followed by thoracic surgery for a thoracic aortic aneurysm.  Has a planned CT chest in October 2020   Allergies  Allergen Reactions  . Protamine Anaphylaxis  . Ace Inhibitors Cough  . Heparin Other (See Comments)    Bleeding, into muscles Bleeding, into muscles    Immunization History  Administered Date(s) Administered  . Influenza Split 05/28/2012, 01/13/2014  . Influenza, High Dose Seasonal PF 01/13/2016, 02/13/2016, 02/03/2017  . Influenza,inj,Quad PF,6+ Mos 05/18/2014  . Pneumococcal Conjugate-13 01/10/2014  . Pneumococcal Polysaccharide-23 04/27/2012, 05/28/2012  . Tdap 10/15/2016    Past Medical History:  Diagnosis Date  . Anxiety    panic attacks  . Arthritis    shoulders, neck   . Bleeding nose    history- 1998?,  hosp. for for 1 week, per pt.   . Blood dyscrasia    response to heparin, protamine- see note in allergy category   . COPD (chronic obstructive pulmonary disease) ([place])   . Diabetes mellitus without complication ([place])   . Erythrocytosis    felt related to smoking; requires periodic phlebotomy (HEM: Dr. Para March)  . GERD (gastroesophageal reflux disease)   . Hemochromatosis   . Hyperlipidemia   . Hypertension   . Neuromuscular disorder (HCC)    numbness in toes in R foot, " back" , spine neurology   . Oxygen dependent   . Pneumonia    H1N1- admitted in respiratory failure in ED- 04/2012   . Pulmonary embolism ([place]) 05/2012   filter for blood clots  . Rectal bleed   . Shortness of breath   . Shoulder pain    left  . Stroke [facility])    2007, R eye (lower vision - lost ) , carotid studies done recently- clear  . Thoracic aortic aneurysm (HCC)    4.4 cm ascending thoracic aortic aneurysm by CT 09/15/12    Tobacco History: Social History   Tobacco Use  Smoking Status Former Smoker  . Packs/day: 1.00  . Years: 50.00  . Pack years: 50.00  . Types: Cigarettes  . Quit date: 11/06/2013  . Years since quitting: 4.9  Smokeless Tobacco Never Used  Tobacco Comment   pt did quit back in  march 2014-12/16/12   Counseling given: Not Answered Comment: pt did quit back in march 2014-12/16/12   Outpatient Medications Prior to Visit  Medication Sig Dispense Refill  . albuterol (PROVENTIL HFA;VENTOLIN HFA) 108 (90 Base) MCG/ACT inhaler Inhale 2 puffs into the lungs every 6 (six) hours as needed for wheezing or shortness of breath. 1 Inhaler 5  . amLODipine (NORVASC) 10 MG tablet Take 5 mg by mouth every morning. Take 0.5 tablets (5 mg total) by mouth daily.    Marland Kitchen BREO ELLIPTA 100-25 MCG/INH AEPB INHALE 1 PUFF BY MOUTH ONCE DAILY 60 each 5  . buPROPion (WELLBUTRIN XL) 300 MG 24 hr tablet   2  . hydrochlorothiazide (HYDRODIURIL) 12.5 MG tablet Take 2 tablets (25 mg total) by mouth daily. 90  tablet 3  . ibuprofen (ADVIL,MOTRIN) 200 MG tablet Take 600 mg by mouth every 8 (eight) hours as needed.     . metFORMIN (GLUCOPHAGE) 500 MG tablet Take by mouth 2 (two) times daily with a meal.    . tiotropium (SPIRIVA) 18 MCG inhalation capsule Place 1 capsule (18 mcg total) into inhaler and inhale daily. 30 capsule 12   No facility-administered medications prior to visit.      Review of Systems:   Constitutional:   No  weight loss, night sweats,  Fevers, chills,  +fatigue, or  lassitude.  HEENT:   No headaches,  Difficulty swallowing,  Tooth/dental problems, or  Sore throat,                No sneezing, itching, ear ache, nasal congestion, post nasal drip,   CV:  No chest pain,  Orthopnea, PND, swelling in lower extremities, anasarca, dizziness, palpitations, syncope.   GI  No heartburn, indigestion, abdominal pain, nausea, vomiting, diarrhea, change in bowel habits, loss of appetite, bloody stools.   Resp:    No chest wall deformity  Skin: no rash or lesions.  GU: no dysuria, change in color of urine, no urgency or frequency.  No flank pain, no hematuria   MS:  No joint pain or swelling.  No decreased range of motion.  No back pain.    Physical Exam  BP 128/88 (BP Location: Left Arm, Cuff Size: Normal)   Pulse (!) 102   Temp 97.7 F (36.5 C) (Oral)   Ht 5' 10.3" (1.786 m)   Wt 234 lb (106.1 kg)   BMI 33.29 kg/m   GEN: A/Ox3; pleasant , NAD, elderly , chronically ill appearing .    HEENT:  Day/AT,  EACs-clear, TMs-wnl, NOSE-clear, THROAT-clear, no lesions, no postnasal drip or exudate noted.   NECK:  Supple w/ fair ROM; no JVD; normal carotid impulses w/o bruits; no thyromegaly or nodules palpated; no lymphadenopathy.    RESP  Decreased BS in bases  no accessory muscle use, no dullness to percussion  CARD:  RRR, no m/r/g, no peripheral edema, pulses intact, no cyanosis or clubbing.  GI:   Soft & nt; nml bowel sounds; no organomegaly or masses detected.   Musco:  Warm bil, no deformities or joint swelling noted.   Neuro: alert, no focal deficits noted.    Skin: Warm, no lesions or rashes    Lab Results:  CBC  BNP No results found for: BNP  ProBNP    Component Value Date/Time   PROBNP 9.0 08/21/2016 1024    Imaging: No results found.    PFT Results Latest Ref Rng & Units 11/25/2016  FVC-Pre L 2.80  FVC-Predicted Pre % 65  FVC-Post L 2.88  FVC-Predicted Post % 66  Pre FEV1/FVC % % 42  Post FEV1/FCV % % 47  FEV1-Pre L 1.17  FEV1-Predicted Pre % 36  FEV1-Post L 1.37  DLCO UNC% % 41  DLCO COR %Predicted % 51  TLC L 6.68  TLC % Predicted % 97  RV % Predicted % 156    No results found for: NITRICOXIDE      Assessment & Plan:   Chronic obstructive pulmonary disease (HCC) Appears stable  Plan  Patient Instructions  Continue on BREO and Spiriva , rinse after use.  Continue on Oxygen 4l/m .  Mucinex DM Twice daily  As needed  Cough/congestion  Follow up Dr. Chase Caller 4 months and As needed   Please contact office for sooner follow up if symptoms do not improve or worsen or seek emergency care       Chronic respiratory failure with hypoxia Continue on O2 .   Nodule of apex of right lung Continue follow up .  CT chest this fall as planned      Rexene Edison, NP 10/02/2018

## 2018-10-02 NOTE — Assessment & Plan Note (Signed)
Appears stable  Plan  Patient Instructions  Continue on BREO and Spiriva , rinse after use.  Continue on Oxygen 4l/m .  Mucinex DM Twice daily  As needed  Cough/congestion  Follow up Dr. Chase Caller 4 months and As needed   Please contact office for sooner follow up if symptoms do not improve or worsen or seek emergency care

## 2018-10-02 NOTE — Assessment & Plan Note (Signed)
Continue on O2 

## 2018-10-04 ENCOUNTER — Other Ambulatory Visit: Payer: Self-pay | Admitting: Internal Medicine

## 2019-02-23 ENCOUNTER — Other Ambulatory Visit: Payer: Self-pay | Admitting: *Deleted

## 2019-02-23 DIAGNOSIS — I712 Thoracic aortic aneurysm, without rupture, unspecified: Secondary | ICD-10-CM

## 2019-03-24 ENCOUNTER — Other Ambulatory Visit: Payer: Medicare Other

## 2019-03-24 ENCOUNTER — Encounter: Payer: Medicare Other | Admitting: Cardiothoracic Surgery

## 2019-04-07 ENCOUNTER — Other Ambulatory Visit: Payer: Self-pay

## 2019-04-07 ENCOUNTER — Telehealth: Payer: Medicare Other | Admitting: Cardiothoracic Surgery

## 2019-04-07 ENCOUNTER — Ambulatory Visit: Payer: Medicare Other

## 2019-04-13 ENCOUNTER — Ambulatory Visit
Admission: RE | Admit: 2019-04-13 | Discharge: 2019-04-13 | Disposition: A | Discharge status: Home / Self Care | Payer: Medicare Other | Source: Ambulatory Visit | Attending: Cardiothoracic Surgery | Admitting: Cardiothoracic Surgery

## 2019-04-13 DIAGNOSIS — I712 Thoracic aortic aneurysm, without rupture, unspecified: Secondary | ICD-10-CM

## 2019-04-14 ENCOUNTER — Telehealth: Payer: Self-pay | Admitting: Cardiothoracic Surgery

## 2019-04-14 ENCOUNTER — Telehealth: Payer: Medicare Other | Admitting: Cardiothoracic Surgery

## 2019-04-14 ENCOUNTER — Other Ambulatory Visit: Payer: Self-pay

## 2019-04-14 NOTE — Telephone Encounter (Signed)
[address]       [place] [address]             [phone]     CARDIOTHORACIC SURGERY TELEPHONE VIRTUAL OFFICE NOTE  Referring Provider is No ref. provider found Primary Cardiologist is No primary care provider on file. PCP is Chesley Noon, MD   HPI:  I spoke with Cody [name] (DOB [birth year] ) via telephone on 04/14/2019 at 11:33 AM and verified that I was speaking with the correct person using more than one form of identification.  We discussed the reason(s) for conducting our visit virtually instead of in-person.  The patient expressed understanding the circumstances and agreed to proceed as described.  Virtual visit performed after surveillance CTA of a stable asymptomatic 4.8 cm fusiform ascending aneurysm.  It has been stable for several years.  Patient has hypertension well controlled on losartan and amlodipine.  He checks his blood pressure 2-3 times weekly.  No symptoms of chest pain.    He is on home oxygen because of severe COPD.  He is followed by the pulmonary clinic and is on inhalers.  He is on home oxygen 3 to 4 L as needed.  The patient has been keeping isolated and has no symptoms of Covid pulmonary disease-fever, cough, headache, myalgia.  I personally reviewed the CTA performed yesterday which shows stable mild-moderate fusiform ascending aneurysm without evidence of mural thickening or ulceration.  It appears to be low risk for tear/dissection.  Patient's pulmonary windows show a small 8 mm round smooth right upper lobe nodule with some bilateral scarring as well.  The nodule does not appear to be suspicious for malignancy at this time.  Current Outpatient Medications  Medication Sig Dispense Refill  . albuterol (PROVENTIL HFA;VENTOLIN HFA) 108 (90 Base) MCG/ACT inhaler Inhale 2 puffs into the lungs every 6 (six) hours as needed for wheezing or shortness of breath. 1 Inhaler 5  . amLODipine (NORVASC) 10 MG tablet Take 5 mg by mouth  every morning. Take 0.5 tablets (5 mg total) by mouth daily.    Marland Kitchen BREO ELLIPTA 100-25 MCG/INH AEPB Inhale 1 puff by mouth once daily 60 each 3  . buPROPion (WELLBUTRIN XL) 300 MG 24 hr tablet   2  . hydrochlorothiazide (HYDRODIURIL) 12.5 MG tablet Take 2 tablets (25 mg total) by mouth daily. 90 tablet 3  . ibuprofen (ADVIL,MOTRIN) 200 MG tablet Take 600 mg by mouth every 8 (eight) hours as needed.     . metFORMIN (GLUCOPHAGE) 500 MG tablet Take by mouth 2 (two) times daily with a meal.    . tiotropium (SPIRIVA) 18 MCG inhalation capsule Place 1 capsule (18 mcg total) into inhaler and inhale daily. 30 capsule 12   No current facility-administered medications for this visit.     Diagnostic Tests:  CT images personally reviewed as noted above  Impression:  Stable moderate 4.8 cm fusiform ascending aneurysm  Stable 8 mm right upper lobe round pulmonary nodule  COPD no home oxygen Stop smoking 4 years ago Hypertension controlled Diabetes mellitus Erythrocytosis, remote history of PE  Plan:  Continue good blood pressure control Annual surveillance CT scan-scan will be scheduled for December 2021 with office visit    I discussed limitations of evaluation and management via telephone.  The patient was advised to call back for repeat telephone consultation or to seek an in-person evaluation if questions arise or the patient's clinical condition changes in any significant manner.  I spent in excess of 8 minutes of non-face-to-face time during the conduct of this telephone virtual office consultation.  Level 1  (99441)             5-10 minutes Level 2  (99442)            11-20 minutes Level 3  (99443)            21-30 minutes    04/14/2019 11:33 AM

## 2019-05-14 ENCOUNTER — Other Ambulatory Visit: Payer: Self-pay | Admitting: Internal Medicine

## 2019-07-19 ENCOUNTER — Other Ambulatory Visit: Payer: Self-pay | Admitting: Internal Medicine

## 2020-03-17 ENCOUNTER — Other Ambulatory Visit: Payer: Self-pay | Admitting: *Deleted

## 2020-03-17 DIAGNOSIS — I712 Thoracic aortic aneurysm, without rupture, unspecified: Secondary | ICD-10-CM

## 2020-04-12 ENCOUNTER — Encounter: Payer: Medicare Other | Admitting: Cardiothoracic Surgery

## 2020-04-12 ENCOUNTER — Other Ambulatory Visit: Payer: Medicare Other

## 2020-05-03 ENCOUNTER — Other Ambulatory Visit: Payer: Self-pay | Admitting: Internal Medicine

## 2020-05-09 ENCOUNTER — Other Ambulatory Visit: Payer: Self-pay | Admitting: Internal Medicine

## 2020-07-11 ENCOUNTER — Other Ambulatory Visit: Payer: Self-pay | Admitting: Internal Medicine

## 2020-07-11 NOTE — Telephone Encounter (Signed)
Pt does have an upcoming appt scheduled with MR 4/11. Called and spoke with pt to see if he was completely out of his Breo inhaler and he said that he had 2 more doses left on it. Stated to him that I was going to send Rx for 1 inhaler to pharmacy for him and stated to him to make sure he kept his upcoming OV so we could continue to refill his meds. Pt verbalized understanding. Nothing further needed.

## 2020-07-17 ENCOUNTER — Other Ambulatory Visit: Payer: Self-pay

## 2020-07-17 ENCOUNTER — Encounter: Payer: Self-pay | Admitting: Internal Medicine

## 2020-07-17 ENCOUNTER — Ambulatory Visit (INDEPENDENT_AMBULATORY_CARE_PROVIDER_SITE_OTHER): Payer: Medicare Other | Admitting: Internal Medicine

## 2020-07-17 VITALS — BP 120/70 | HR 81 | Ht 70.0 in | Wt 234.0 lb

## 2020-07-17 DIAGNOSIS — Z7185 Encounter for immunization safety counseling: Secondary | ICD-10-CM

## 2020-07-17 DIAGNOSIS — R911 Solitary pulmonary nodule: Secondary | ICD-10-CM

## 2020-07-17 DIAGNOSIS — J9611 Chronic respiratory failure with hypoxia: Secondary | ICD-10-CM | POA: Diagnosis not present

## 2020-07-17 DIAGNOSIS — J449 Chronic obstructive pulmonary disease, unspecified: Secondary | ICD-10-CM

## 2020-07-17 DIAGNOSIS — I712 Thoracic aortic aneurysm, without rupture, unspecified: Secondary | ICD-10-CM

## 2020-07-17 NOTE — Patient Instructions (Addendum)
Chronic obstructive pulmonary disease, unspecified COPD type ([place]) Chronic respiratory failure with hypoxia (HCC)  -COPD is clinically stable  Plan -Check arterial blood gas -Continue Spiriva and Breo as scheduled with albuterol as needed -Continue oxygen 4-5 L nasal cannula scheduled  Nodule of apex of right lung 8 mm in January 2021 Thoracic aortic aneurysm without rupture ([place]) -4.9 cm in January 2021  Plan  -Get high-resolution CT chest without contrast   Vaccine counseling  -Recommend third shot COVID booster vaccine  FOlllwup  -With Dr. Chase Caller or nurse practitioner in the next few to several weeks to review

## 2020-07-17 NOTE — Progress Notes (Signed)
OV 11/25/2016  Chief Complaint  Patient presents with  . Follow-up    Pt here after PFT. Pt states his breathing is unchanged since last OV. Pt states he has an occassional cough. Pt denies CP/tightness and f/c/s.   Follow-up advanced COPD chronic hypoxic respiratory failure: He had pulmonary function test today showed stage III COPD. He has significant bronchodilator response of 17%. He is on Spiriva and low-dose Brio. He feels it really helps him. We discussed lung transportation he declined evaluation. 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 His increase his dose on antidepressants he feels more optimistic now. He feels he canlive another 5-10 years with COPD and continue the same.   Follow-up lung nodule: May 2018 he ruled out pulmonary embolism. During this time incidental right upper lobe lung nodule 5 mm was discovered. It is enlarged since 2014 but same since 2017 December.   OV 08/14/2017  Chief Complaint  Patient presents with  . Follow-up    Pt has complaints of SOB that is worse with exertion. Denies any cough or CP.    Follow-up advanced COPD with chronic hypoxemic restaurant failure: He is on triple inhaler therapy with Spiriva and Brio.  No symptoms COPD exacerbation since last visit.  He is doing great.  He supports his granddaughters.  1 of them does not want to work the other one is graduating tomorrow from college.  Another one has 1 more year to go.  He is slowly retiring.  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  He does not want transplant.  He continues on oxygen.  He is asking about Trelegy but after discussion decided to defer it.  5 mm right apical lung nodule 5 mm seen in May 2018 with growth since 2014: He did have a CT for cardiac reasons in September 2018 and this nodule was deemed stable.  There are no other issues.  Results for ESTIBEN, MIZUNO (MRN  [id number]) as of 11/25/2016 12:12  Ref. Range 11/25/2016 10:12  FEV1-Post Latest Units: L 1.37  FEV1-%Pred-Post Latest Units: % 42  FEV1-%Change-Post Latest Units: % 17  Post FEV1/FVC ratio Latest Units: % 47   Results for JOSEJUAN, HOAGLIN (MRN [id number]) as of 11/25/2016 12:12  Ref. Range 11/25/2016 10:12  DLCO cor Latest Units: ml/min/mmHg 12.09  DLCO cor % pred Latest Units: % 39     OV 01/16/2018  Subjective:  Patient ID: Cody [name], male , DOB: 30-Mar-1951 , age 70 y.o. , MRN: [id number] , ADDRESS: [place]  [id number]   01/16/2018 -   Chief Complaint  Patient presents with  . Follow-up    Doing well at this time no changes.     HPI Cody [name] 70 y.o. -advanced COPD with chronic hypoxemic respiratory failure follow-up.  Also right upper lobe lung nodules follow-up.    He reports overall he stable.  He is not quit work.  COPD CAT score is 9 so he is doing well.  CT scan does not show any worsening emphysema or pulmonary fibrosis onset of cancer.  Lung nodule is stable.  He has new finding of worsening thoracic artery aorta aneurysm.  He is asymptomatic.  He has not seen Dr. Dahlia Byes in a year and a half according to his history.  He has not had his flu shot and says  he will have an outside.  He does not want to have it with [place] today.  See him returned his oxygen off given the fact his COPD CAT score is so low.  And his walking desaturation test on room air shows -   Below desats with walking, needs 4L [place] to correct.  However he wants his oxygen for safety and comfort     CAT COPD Symptom & Quality of Life Score ([place] trademark) 0 is no burden. 5 is highest burden 01/16/2018   Never Cough -> Cough all the time 1  No phlegm in chest -> Chest is full of phlegm 1  No chest tightness -> Chest feels very tight 0  No dyspnea for 1 flight stairs/hill -> Very dyspneic for 1 flight of stairs 4  No limitations for ADL at home -> Very limited with ADL at  home 0  Confident leaving home -> Not at all confident leaving home 0  Sleep soundly -> Do not sleep soundly because of lung condition 0  Lots of Energy -> No energy at all 3  TOTAL Score (max 40)  9    Simple office walk 185 feet x  3 laps goal with forehead probe 01/16/2018   O2 used  after being on room air for 15 minutes  Number laps completed  1 lap and desaturated  Comments about pace  fast pace  Resting Pulse Ox/HR  92% % and 96/min  Final Pulse Ox/HR  86% % and 104/min -after first lap  Desaturated </= 88%  yes  Desaturated <= 3% points  yes  Got Tachycardic >/= 90/min  yes  Symptoms at end of test  dyspnea  Miscellaneous comments  required 4 L to correct and do 3 laps       ROS - per HPI  IMPRESSION: 1. Stable exam. 2. No change in the 5-6 mm posterior right upper lobe pulmonary nodule. 3. Ascending thoracic aorta measures 4.7 mm compared to 4.5 cm previously. Ascending thoracic aortic aneurysm. Recommend semi-annual imaging followup by CTA or MRA and referral to cardiothoracic surgery if not already obtained. This recommendation follows 2010 ACCF/AHA/AATS/ACR/ASA/SCA/SCAI/SIR/STS/SVM Guidelines for the Diagnosis and Management of Patients With Thoracic Aortic Disease. Circulation. 2010; 121: F751-W258 4.  Aortic Atherosclerois (ICD10-170.0) 5.  Emphysema. (ICD10-J43.9) 6. Stable left adrenal adenoma.   Electronically Signed   By: [name] M.D.   On: 01/13/2018 16:26   OV 07/17/2020  Subjective:  Patient ID: Cody [name], male , DOB: Aug 14, 1950 , age 70 y.o. , MRN: [id number] , ADDRESS: [place] [id number] PCP Chesley Noon, MD Patient Care Team: Chesley Noon, MD as PCP - General (Family Medicine) Brand Males, MD as Consulting Physician (Pulmonary Disease) Sueanne Margarita, MD as Consulting Physician (Cardiology)  This Provider for this visit: Treatment Team:  Attending Provider: Brand Males,  MD    07/17/2020 -   Chief Complaint  Patient presents with  . Follow-up    Pt states he is a little more SOB than he was at last office visit. Requiring 4-5L O2 24/7.     HPI Cody [name] 70 y.o. -  Follow-up advanced COPD chronic hypoxemic respiratory failure Follow-up right upper lobe lung nodule   Returns for follow-up.  I personally saw him in 2019.  After the onset of the pandemic have not seen him.  He saw a nurse practitioner once in 2020.  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  He has had his 2 vaccines.  He is frustrated his family will not get vaccinated.  He has gained weight because of the pandemic but has managed to stay himself without Covid.  He is on Spiriva and Breo and oxygen.  Otherwise he is doing well.  He is willing to get restage.  Review of the records indicate he has thoracic aorta aneurysm that is growing.  He is only had 2 shots of the Covid vaccine.  CAT Score 07/17/2020  Total CAT Score 16      PFT  PFT Results Latest Ref Rng & Units 11/25/2016  FVC-Pre L 2.80  FVC-Predicted Pre % 65  FVC-Post L 2.88  FVC-Predicted Post % 66  Pre FEV1/FVC % % 42  Post FEV1/FCV % % 47  FEV1-Pre L 1.17  FEV1-Predicted Pre % 36  FEV1-Post L 1.37  DLCO uncorrected ml/min/mmHg 12.81  DLCO UNC% % 41  DLCO corrected ml/min/mmHg 12.09  DLCO COR %Predicted % 39  DLVA Predicted % 51  TLC L 6.68  TLC % Predicted % 97  RV % Predicted % 156   CT chest Jan 2021   IMPRESSION: 1. Slight interval increase in size of the ascending thoracic aortic aneurysm, now measuring up to 4.9 cm, previously 4.7 cm. 2. Slight interval increase in size of the right upper lobe pulmonary nodule, now measuring 8 mm, previously 6 mm. This has slowly enlarged since 2014 when it measured 3 mm. Continued attention on follow-up is recommended. Consultation with pulmonary medicine or thoracic surgery is suggested, as  clinically appropriate. 3. Aortic Atherosclerosis (ICD10-I70.0). 4.  Emphysema (ICD10-J43.9).   Electronically Signed   By: Titus Dubin M.D.   On: 04/13/2019 13:28     has a past medical history of Anxiety, Arthritis, Bleeding nose, Blood dyscrasia, COPD (chronic obstructive pulmonary disease) ([place]), Diabetes mellitus without complication ([place]), Erythrocytosis, GERD (gastroesophageal reflux disease), Hemochromatosis, Hyperlipidemia, Hypertension, Neuromuscular disorder ([place]), Oxygen dependent, Pneumonia, Pulmonary embolism ([place]) (05/2012), Rectal bleed, Shortness of breath, Shoulder pain, Stroke [facility]), and Thoracic aortic aneurysm ([place]).   reports that he quit smoking about 8 years ago. His smoking use included cigarettes. He started smoking about 56 years ago. He has a 125.00 pack-year smoking history. He has never used smokeless tobacco.  Past Surgical History:  Procedure Laterality Date  . GASTROSTOMY N/A 05/19/2012   Procedure: PEG Possible Open Gastrostomy;  Surgeon: Shann Medal, MD;  Location: [facility];  Service: General;  Laterality: N/A;  . INSERTION OF VENA CAVA FILTER     to prevent further pumonary embolis  . MULTIPLE TOOTH EXTRACTIONS    . TRACHEOSTOMY  2014   & closure, relative to pneumonia   . VIDEO BRONCHOSCOPY Bilateral 06/03/2012   Procedure: VIDEO BRONCHOSCOPY WITHOUT FLUORO;  Surgeon: Raylene Miyamoto, MD;  Location: [facility];  Service: Cardiopulmonary;  Laterality: Bilateral;    Allergies  Allergen Reactions  . Protamine Anaphylaxis  . Ace Inhibitors Cough  . Heparin Other (See Comments)    Bleeding, into muscles Bleeding, into muscles    Immunization History  Administered Date(s) Administered  . Influenza Split 05/28/2012, 01/13/2014  . Influenza, High Dose Seasonal PF 01/13/2016, 02/13/2016, 02/03/2017, 02/19/2018, 02/12/2019  . Influenza,inj,Quad PF,6+ Mos 05/18/2014  . PFIZER(Purple Top)SARS-COV-2 Vaccination 09/24/2019, 10/16/2019  .  Pneumococcal Conjugate-13 01/10/2014  . Pneumococcal Polysaccharide-23 04/27/2012, 05/28/2012, 05/19/2018  . Tdap 10/15/2016    Family History  Problem Relation Age of  Onset  . COPD Mother      Current Outpatient Medications:  .  amLODipine (NORVASC) 10 MG tablet, Take 5 mg by mouth every morning. Take 0.5 tablets (5 mg total) by mouth daily., Disp: , Rfl:  .  BREO ELLIPTA 100-25 MCG/INH AEPB, Inhale 1 puff by mouth once daily, Disp: 60 each, Rfl: 0 .  buPROPion (WELLBUTRIN XL) 300 MG 24 hr tablet, , Disp: , Rfl: 2 .  hydrochlorothiazide (HYDRODIURIL) 12.5 MG tablet, Take 2 tablets (25 mg total) by mouth daily., Disp: 90 tablet, Rfl: 3 .  ipratropium-albuterol (DUONEB) 0.5-2.5 (3) MG/3ML SOLN, USE 1 AMPULE IN NEBULIZER EVERY 4 HOURS, Disp: , Rfl:  .  losartan (COZAAR) 100 MG tablet, Take 1 tablet by mouth daily., Disp: , Rfl:  .  metFORMIN (GLUCOPHAGE) 500 MG tablet, Take by mouth 2 (two) times daily with a meal., Disp: , Rfl:  .  metoprolol succinate (TOPROL-XL) 25 MG 24 hr tablet, Take 1 tablet by mouth daily., Disp: , Rfl:  .  tiotropium (SPIRIVA) 18 MCG inhalation capsule, Place 1 capsule (18 mcg total) into inhaler and inhale daily., Disp: 30 capsule, Rfl: 12 .  traZODone (DESYREL) 50 MG tablet, Take by mouth., Disp: , Rfl:  .  albuterol (PROVENTIL HFA;VENTOLIN HFA) 108 (90 Base) MCG/ACT inhaler, Inhale 2 puffs into the lungs every 6 (six) hours as needed for wheezing or shortness of breath. (Patient not taking: Reported on 07/17/2020), Disp: 1 Inhaler, Rfl: 5      Objective:   Vitals:   07/17/20 0912  BP: 120/70  Pulse: 81  SpO2: 96%  Weight: 234 lb (106.1 kg)  Height: 5\' 10"  (1.778 m)    Estimated body mass index is 33.58 kg/m as calculated from the following:   Height as of this encounter: 5\' 10"  (1.778 m).   Weight as of this encounter: 234 lb (106.1 kg).  @WEIGHTCHANGE @  [employer]   07/17/20 0912  Weight: 234 lb (106.1 kg)     Physical Exam   General:  No distress.  Pleasant male has gained weight Neuro: Alert and Oriented x 3. GCS 15. Speech normal Psych: Pleasant Resp:  Barrel Chest - yes.  Wheeze - noi, Crackles - no, No overt respiratory distress CVS: Normal heart sounds. Murmurs - no Ext: Stigmata of Connective Tissue Disease - no HEENT: Normal upper airway. PEERL +. No post nasal drip. TRACH SCAR +        Assessment:       ICD-10-CM   1. Chronic obstructive pulmonary disease, unspecified COPD type ([place])  J44.9 CT Chest High Resolution  2. Chronic respiratory failure with hypoxia (HCC)  J96.11   3. Nodule of apex of right lung  R91.1 CT Chest High Resolution  4. Thoracic aortic aneurysm without rupture (HCC)  I71.2 CT Chest High Resolution  5. Vaccine counseling  Z71.85        Plan:     Patient Instructions  Chronic obstructive pulmonary disease, unspecified COPD type ([place]) Chronic respiratory failure with hypoxia ([place])  -COPD is clinically stable  Plan -Check arterial blood gas -Continue Spiriva and Breo as scheduled with albuterol as needed -Continue oxygen 4-5 L nasal cannula scheduled  Nodule of apex of right lung 8 mm in January 2021 Thoracic aortic aneurysm without rupture ([place]) -4.9 cm in January 2021  Plan  -Get high-resolution CT chest without contrast   Vaccine counseling  -Recommend third shot COVID booster vaccine  FOlllwup  -With Dr. Chase Caller or nurse practitioner in  the next few to several weeks to review     SIGNATURE    Dr. Brand Males, M.D., F.C.C.P,  Pulmonary and Critical Care Medicine Staff Physician, [place] Director - Interstitial Lung Disease  Program  [facility] [place], [place], [address]  Pager: [phone], If no answer or between  15:00h - 7:00h: call [phone] Telephone: [phone]  9:36 AM 07/17/2020

## 2020-07-24 ENCOUNTER — Inpatient Hospital Stay (HOSPITAL_COMMUNITY): Admission: RE | Admit: 2020-07-24 | Payer: Medicare Other | Source: Ambulatory Visit

## 2020-07-25 ENCOUNTER — Inpatient Hospital Stay: Admission: RE | Admit: 2020-07-25 | Payer: Medicare Other | Source: Ambulatory Visit

## 2020-08-17 ENCOUNTER — Ambulatory Visit: Payer: Medicare Other | Admitting: Internal Medicine

## 2020-09-07 ENCOUNTER — Other Ambulatory Visit: Payer: Self-pay | Admitting: Internal Medicine

## 2020-10-18 ENCOUNTER — Other Ambulatory Visit: Payer: Self-pay | Admitting: Internal Medicine

## 2020-12-27 ENCOUNTER — Telehealth: Payer: Self-pay | Admitting: Internal Medicine

## 2020-12-27 NOTE — Telephone Encounter (Signed)
Called and spoke with patient to make sure he is aware of scheduled appointment. He confirmed he is. Nothing further needed at this time.

## 2020-12-27 NOTE — Telephone Encounter (Signed)
ATC Patient. LM to call back. Patient is scheduled 12/29/20 with Dr. Chase Caller.

## 2020-12-29 ENCOUNTER — Ambulatory Visit: Payer: Medicare Other | Admitting: Internal Medicine

## 2021-01-04 ENCOUNTER — Other Ambulatory Visit: Payer: Self-pay

## 2021-01-04 ENCOUNTER — Encounter: Payer: Self-pay | Admitting: Adult Health

## 2021-01-04 ENCOUNTER — Ambulatory Visit (INDEPENDENT_AMBULATORY_CARE_PROVIDER_SITE_OTHER): Payer: Medicare Other | Admitting: Adult Health

## 2021-01-04 VITALS — BP 128/86 | HR 70 | Temp 97.6°F | Ht 70.0 in | Wt 216.0 lb

## 2021-01-04 DIAGNOSIS — J9611 Chronic respiratory failure with hypoxia: Secondary | ICD-10-CM

## 2021-01-04 DIAGNOSIS — R911 Solitary pulmonary nodule: Secondary | ICD-10-CM

## 2021-01-04 DIAGNOSIS — J449 Chronic obstructive pulmonary disease, unspecified: Secondary | ICD-10-CM | POA: Diagnosis not present

## 2021-01-04 DIAGNOSIS — R062 Wheezing: Secondary | ICD-10-CM | POA: Diagnosis not present

## 2021-01-04 LAB — BRAIN NATRIURETIC PEPTIDE: Pro B Natriuretic peptide (BNP): 133 pg/mL — ABNORMAL HIGH (ref 0.0–100.0)

## 2021-01-04 LAB — CBC WITH DIFFERENTIAL/PLATELET
Basophils Absolute: 0.1 10*3/uL (ref 0.0–0.1)
Basophils Relative: 0.8 % (ref 0.0–3.0)
Eosinophils Absolute: 0.2 10*3/uL (ref 0.0–0.7)
Eosinophils Relative: 1.9 % (ref 0.0–5.0)
HCT: 48.8 % (ref 39.0–52.0)
Hemoglobin: 15.8 g/dL (ref 13.0–17.0)
Lymphocytes Relative: 16.9 % (ref 12.0–46.0)
Lymphs Abs: 1.4 10*3/uL (ref 0.7–4.0)
MCHC: 32.5 g/dL (ref 30.0–36.0)
MCV: 96.3 fl (ref 78.0–100.0)
Monocytes Absolute: 0.7 10*3/uL (ref 0.1–1.0)
Monocytes Relative: 8.4 % (ref 3.0–12.0)
Neutro Abs: 6.1 10*3/uL (ref 1.4–7.7)
Neutrophils Relative %: 72 % (ref 43.0–77.0)
Platelets: 171 10*3/uL (ref 150.0–400.0)
RBC: 5.07 Mil/uL (ref 4.22–5.81)
RDW: 17.2 % — ABNORMAL HIGH (ref 11.5–15.5)
WBC: 8.5 10*3/uL (ref 4.0–10.5)

## 2021-01-04 LAB — BASIC METABOLIC PANEL
BUN: 22 mg/dL (ref 6–23)
CO2: 37 mEq/L — ABNORMAL HIGH (ref 19–32)
Calcium: 9.3 mg/dL (ref 8.4–10.5)
Chloride: 98 mEq/L (ref 96–112)
Creatinine, Ser: 1.29 mg/dL (ref 0.40–1.50)
GFR: 55.35 mL/min — ABNORMAL LOW (ref >60.00)
Glucose, Bld: 117 mg/dL — ABNORMAL HIGH (ref 70–99)
Potassium: 4.1 mEq/L (ref 3.5–5.1)
Sodium: 140 mEq/L (ref 135–145)

## 2021-01-04 MED ORDER — PREDNISONE 20 MG PO TABS: 20.0000 mg | ORAL_TABLET | Freq: Every day | ORAL | 0 refills | Status: DC

## 2021-01-04 NOTE — Progress Notes (Signed)
[place]  @Patient  ID: Cody [name], male    DOB: June 06, 1950, 70 y.o.   MRN: [id number]  Chief Complaint  Patient presents with   Follow-up    Referring provider: Chesley Noon, MD  HPI: 70 year old male former smoker followed for severe COPD, oxygen dependent respiratory failure, lung nodules Medical history significant for critical illness 2014 with influenza, aspiration pneumonia and COPD exacerbation with prolonged hospitalization requiring trach.  Also complicated by PE and DVT.  TEST/EVENTS :  CT chest 05/2015 >tiny pulmonary noduels in RUL -scarring in left lung .  CT chest October 2019 no change in right upper lobe pulmonary nodule, ascending thoracic aorta 4.7 cm compared to 4.5 cm  CT chest April 13, 2019 slight interval increase in size of the ascending thoracic aortic aneurysm now measuring 4.9 cm previously at 4.7 cm.  Slight interval increase in the right upper lobe pulmonary nodule measuring 8 mm previously at 6 mm.  This has slowly enlarged from 2014 measuring at 3 mm.  Pulmonary function test August 2018 FEV1 42%, ratio 47, FVC 66%,positive bronchodilator response.  DLCO 41%.  01/04/2021 Follow up: COPD, O2 RF , Lung nodule  Patient returns for a 28-month follow-up.  Patient has underlying severe COPD that is oxygen dependent.  He is maintained on Breo and Spiriva. Complains that over last few months breathing not doing as well, increased dyspnea with activitity and decreased activity tolerance.  No calf pain or hemoptysis.  Remains on chronic oxygen at 4l/m . Does turn up to 5l/m, to help with dyspnea. Does not check oxygen sats at home .   Patient has known lung nodule that has slowly increased in size since 2014.  CT chest January 2021 showed slight increase in pulmonary nodule measuring 8 mm previously at 6 mm RUL  Patient was set up for a high-resolution CT chest and also pulmonary function testing.  Unfortunately patient did not return for these.  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   Had labs done 3 months ago at PCP reported as okay .        Allergies  Allergen Reactions   Protamine Anaphylaxis   Heparin Other (See Comments)    Bleeding, into muscles Bleeding, into muscles   Ace Inhibitors Cough    Immunization History  Administered Date(s) Administered   Influenza Split 05/28/2012, 01/13/2014   Influenza, High Dose Seasonal PF 01/13/2016, 02/13/2016, 02/03/2017, 02/19/2018, 02/12/2019   Influenza,inj,Quad PF,6+ Mos 05/18/2014   PFIZER(Purple Top)SARS-COV-2 Vaccination 09/24/2019, 10/16/2019   Pneumococcal Conjugate-13 01/10/2014   Pneumococcal Polysaccharide-23 04/27/2012, 05/28/2012, 05/19/2018   Tdap 10/15/2016    Past Medical History:  Diagnosis Date   Anxiety    panic attacks   Arthritis    shoulders, neck    Bleeding nose    history- 1998?, hosp. for for 1 week, per pt.    Blood dyscrasia    response to heparin, protamine- see note in allergy category    COPD (chronic obstructive pulmonary disease) ([place])    Diabetes mellitus without complication ([place])    Erythrocytosis    felt related to smoking; requires periodic phlebotomy (HEM: Dr. Para March)   GERD (gastroesophageal reflux disease)    Hemochromatosis    Hyperlipidemia    Hypertension    Neuromuscular disorder ([place])    numbness in toes in R foot, " back" , spine neurology    Oxygen dependent    Pneumonia    H1N1- admitted in respiratory failure in ED- 04/2012  Pulmonary embolism ([place]) 05/2012   filter for blood clots   Rectal bleed    Shortness of breath    Shoulder pain    left   Stroke ([place])    2007, R eye (lower vision - lost ) , carotid studies done recently- clear   Thoracic aortic aneurysm ([place])    4.4 cm ascending thoracic aortic aneurysm by CT 09/15/12    Tobacco History: Social History   Tobacco Use  Smoking Status Former   Packs/day: 2.50   Years: 50.00   Pack years: 125.00   Types: Cigarettes   Start  date: 1966   Quit date: 07/07/2012   Years since quitting: 8.5  Smokeless Tobacco Never   Counseling given: Not Answered   Outpatient Medications Prior to Visit  Medication Sig Dispense Refill   albuterol (PROVENTIL HFA;VENTOLIN HFA) 108 (90 Base) MCG/ACT inhaler Inhale 2 puffs into the lungs every 6 (six) hours as needed for wheezing or shortness of breath. 1 Inhaler 5   amLODipine (NORVASC) 10 MG tablet Take 5 mg by mouth every morning. Take 0.5 tablets (5 mg total) by mouth daily.     buPROPion (WELLBUTRIN XL) 300 MG 24 hr tablet   2   fluticasone furoate-vilanterol (BREO ELLIPTA) 100-25 MCG/INH AEPB Inhale 1 puff into the lungs daily at 6 (six) AM. 60 each 0   hydrochlorothiazide (HYDRODIURIL) 12.5 MG tablet Take 2 tablets (25 mg total) by mouth daily. 90 tablet 3   ipratropium-albuterol (DUONEB) 0.5-2.5 (3) MG/3ML SOLN USE 1 AMPULE IN NEBULIZER EVERY 4 HOURS     losartan (COZAAR) 100 MG tablet Take 1 tablet by mouth daily.     metFORMIN (GLUCOPHAGE) 500 MG tablet Take by mouth 2 (two) times daily with a meal.     metoprolol succinate (TOPROL-XL) 25 MG 24 hr tablet Take 1 tablet by mouth daily.     tiotropium (SPIRIVA) 18 MCG inhalation capsule Place 1 capsule (18 mcg total) into inhaler and inhale daily. 30 capsule 12   No facility-administered medications prior to visit.     Review of Systems:   Constitutional:   No  weight loss, night sweats,  Fevers, chills,  +fatigue, or  lassitude.  HEENT:   No headaches,  Difficulty swallowing,  Tooth/dental problems, or  Sore throat,                No sneezing, itching, ear ache, nasal congestion, post nasal drip,   CV:  No chest pain,  Orthopnea, PND, swelling in lower extremities, anasarca, dizziness, palpitations, syncope.   GI  No heartburn, indigestion, abdominal pain, nausea, vomiting, diarrhea, change in bowel habits, loss of appetite, bloody stools.   Resp: .  No chest wall deformity  Skin: no rash or lesions.  GU: no  dysuria, change in color of urine, no urgency or frequency.  No flank pain, no hematuria   MS:  No joint pain or swelling.  No decreased range of motion.  No back pain.    Physical Exam  BP 128/86 (BP Location: Left Arm, Patient Position: Sitting, Cuff Size: Normal)   Pulse 70   Temp 97.6 F (36.4 C) (Oral)   Ht 5\' 10"  (1.778 m)   Wt 216 lb (98 kg)   SpO2 94%   BMI 30.99 kg/m   GEN: A/Ox3; pleasant , NAD, elderly on O2    HEENT:  [place]/AT,  NOSE-clear, THROAT-clear, no lesions, no postnasal drip or exudate noted.   NECK:  Supple w/ fair ROM; no  JVD; normal carotid impulses w/o bruits; no thyromegaly or nodules palpated; no lymphadenopathy.    RESP  Clear  P & A; w/o, wheezes/ rales/ or rhonchi. no accessory muscle use, no dullness to percussion  CARD:  RRR, no m/r/g, tr  peripheral edema, pulses intact, no cyanosis or clubbing.  GI:   Soft & nt; nml bowel sounds; no organomegaly or masses detected.   Musco: Warm bil, no deformities or joint swelling noted.   Neuro: alert, no focal deficits noted.    Skin: Warm, no lesions or rashes    Lab Results:  CBC    Component Value Date/Time   WBC 8.6 08/21/2016 1024   RBC 5.18 08/21/2016 1024   HGB 15.5 08/21/2016 1024   HCT 47.1 08/21/2016 1024   PLT 208.0 08/21/2016 1024   MCV 91.0 08/21/2016 1024   MCH 28.0 02/24/2013 0933   MCHC 32.9 08/21/2016 1024   RDW 14.0 08/21/2016 1024   LYMPHSABS 1.5 08/21/2016 1024   MONOABS 0.8 08/21/2016 1024   EOSABS 0.2 08/21/2016 1024   BASOSABS 0.1 08/21/2016 1024    BMET    Component Value Date/Time   NA 139 08/21/2016 1024   K 4.1 08/21/2016 1024   CL 91 (L) 08/21/2016 1024   CO2 45 (H) 08/21/2016 1024   GLUCOSE 150 (H) 08/21/2016 1024   BUN 16 08/21/2016 1024   CREATININE 0.93 08/21/2016 1024   CREATININE 0.90 03/23/2014 1406   CALCIUM 9.7 08/21/2016 1024   GFRNONAA 89 (L) 02/24/2013 0933   GFRAA >90 02/24/2013 0933    BNP No results found for: BNP  ProBNP     Component Value Date/Time   PROBNP 9.0 08/21/2016 1024    Imaging: No results found.    PFT Results Latest Ref Rng & Units 11/25/2016  FVC-Pre L 2.80  FVC-Predicted Pre % 65  FVC-Post L 2.88  FVC-Predicted Post % 66  Pre FEV1/FVC % % 42  Post FEV1/FCV % % 47  FEV1-Pre L 1.17  FEV1-Predicted Pre % 36  FEV1-Post L 1.37  DLCO uncorrected ml/min/mmHg 12.81  DLCO UNC% % 41  DLCO corrected ml/min/mmHg 12.09  DLCO COR %Predicted % 39  DLVA Predicted % 51  TLC L 6.68  TLC % Predicted % 97  RV % Predicted % 156    No results found for: NITRICOXIDE      Assessment & Plan:   Stage 3 severe COPD by GOLD classification (HCC) Severe COPD with mild flare.  Check labs today.  CT chest. Short prednisone burst with prednisone 20 mg x 3 days.  Plan  Patient Instructions  Continue on BREO and Spiriva, rinse after use.  Albuterol inhaler or neb As needed   Mucinex DM Twice daily  As needed  cough/congestion  Prednisone 20mg  daily for 3 days .  Labs today.  Set up for HRCT chest .  Continue on Oxygen 4l/m.  Follow up with Dr. Chase Caller in 4-6 weeks and As needed   Please contact office for sooner follow up if symptoms do not improve or worsen or seek emergency care       Nodule of apex of right lung Right upper lobe lung nodule with slight growth on last CT chest.  Check CT chest.  Chronic respiratory failure with hypoxia Walk test in the office today on 4 L showed O2 saturations greater than 90% on 4 L at rest and with walking.  Patient's continue on 4 L of oxygen.       Aleaha Fickling  Toluwani Yadav, NP 01/04/2021

## 2021-01-04 NOTE — Assessment & Plan Note (Signed)
Right upper lobe lung nodule with slight growth on last CT chest.  Check CT chest.

## 2021-01-04 NOTE — Assessment & Plan Note (Signed)
Severe COPD with mild flare.  Check labs today.  CT chest. Short prednisone burst with prednisone 20 mg x 3 days.  Plan  Patient Instructions  Continue on BREO and Spiriva, rinse after use.  Albuterol inhaler or neb As needed   Mucinex DM Twice daily  As needed  cough/congestion  Prednisone 20mg  daily for 3 days .  Labs today.  Set up for HRCT chest .  Continue on Oxygen 4l/m.  Follow up with Dr. Chase Caller in 4-6 weeks and As needed   Please contact office for sooner follow up if symptoms do not improve or worsen or seek emergency care

## 2021-01-04 NOTE — Patient Instructions (Addendum)
Continue on BREO and Spiriva, rinse after use.  Albuterol inhaler or neb As needed   Mucinex DM Twice daily  As needed  cough/congestion  Prednisone 20mg  daily for 3 days .  Labs today.  Set up for HRCT chest .  Continue on Oxygen 4l/m.  Follow up with Dr. Chase Caller in 4-6 weeks and As needed   Please contact office for sooner follow up if symptoms do not improve or worsen or seek emergency care

## 2021-01-04 NOTE — Assessment & Plan Note (Signed)
Walk test in the office today on 4 L showed O2 saturations greater than 90% on 4 L at rest and with walking.  Patient's continue on 4 L of oxygen.

## 2021-01-05 ENCOUNTER — Ambulatory Visit: Payer: Medicare Other | Admitting: Internal Medicine

## 2021-01-08 NOTE — Progress Notes (Signed)
Called and spoke with patient, provided results/recommendations per Tammy Parrett NP.  He verbalized understanding.  Nothing further needed.

## 2021-01-11 ENCOUNTER — Ambulatory Visit (HOSPITAL_COMMUNITY)
Admission: RE | Admit: 2021-01-11 | Discharge: 2021-01-11 | Disposition: A | Discharge status: Home / Self Care | Payer: Medicare Other | Source: Ambulatory Visit | Attending: Internal Medicine | Admitting: Internal Medicine

## 2021-01-11 ENCOUNTER — Other Ambulatory Visit: Payer: Self-pay

## 2021-01-11 DIAGNOSIS — R911 Solitary pulmonary nodule: Secondary | ICD-10-CM | POA: Diagnosis present

## 2021-01-11 DIAGNOSIS — J449 Chronic obstructive pulmonary disease, unspecified: Secondary | ICD-10-CM | POA: Insufficient documentation

## 2021-01-11 DIAGNOSIS — I712 Thoracic aortic aneurysm, without rupture, unspecified: Secondary | ICD-10-CM | POA: Diagnosis present

## 2021-01-15 ENCOUNTER — Telehealth: Payer: Self-pay | Admitting: Internal Medicine

## 2021-01-15 NOTE — Telephone Encounter (Signed)
He can also just do PET scan and then just see me before we decide anything but a PET scan is required ideally

## 2021-01-15 NOTE — Telephone Encounter (Signed)
He has enalrgement I nodule ad also has new mediastinal adenopathy  Plan  - PET scan ASAP and see Byrum/ICard   CT Chest High Resolution  Result Date: 01/14/2021 CLINICAL DATA:  COPD. Worsening dyspnea for 3 months. Follow-up pulmonary nodule. EXAM: CT CHEST WITHOUT CONTRAST TECHNIQUE: Multidetector CT imaging of the chest was performed following the standard protocol without intravenous contrast. High resolution imaging of the lungs, as well as inspiratory and expiratory imaging, was performed. COMPARISON:  04/13/2019 chest CT. FINDINGS: Cardiovascular: Normal heart size. No significant pericardial effusion/thickening. Three-vessel coronary atherosclerosis. Atherosclerotic thoracic aorta with 4.7 cm ascending thoracic aortic aneurysm, stable. Prominently dilated main pulmonary artery (4.6 cm diameter). Mediastinum/Nodes: No discrete thyroid nodules. Unremarkable esophagus. No axillary adenopathy. Right paratracheal adenopathy up to the 2.4 cm stone axis diameter (series 2/image 66), increased from 1.4 cm. Multiple enlarged subcarinal lymph nodes, largest 2.0 cm (series 2/image 85), increased from 1.2 cm. Enlarged 1.5 cm right infrahilar node (series 2/image 95), poorly delineated on these noncontrast images, increased from 1.2 cm. No discrete left hilar adenopathy. Lungs/Pleura: No pneumothorax. No pleural effusion. Severe centrilobular emphysema with mild diffuse bronchial wall thickening. Solid 0.7 cm posterior right upper lobe pulmonary nodule (series 10/image 34), previously 0.7 cm using similar measurement technique, stable. New indistinct 0.7 cm medial right lower lobe subpleural solid pulmonary nodule (series 10/image 120). Chronic thick curvilinear bandlike consolidation in the medial right middle lobe and inferior lingula with associated volume loss, unchanged. Thick bandlike consolidation with some volume loss in the dependent basilar right lower lobe, increased. Additional smaller scattered  parenchymal bands throughout both lungs, most prominent in the bilateral lung bases and posterior left upper lobe, not substantially changed. Focal mild varicoid bronchiectasis in the anterior left lower lobe with associated mucoid impaction, mildly increased (series 10/image 112). No significant regions of subpleural reticulation, ground-glass attenuation or frank honeycombing. No significant lobular air trapping or evidence of tracheobronchomalacia on the expiration sequence. Upper abdomen: Left adrenal 2.1 cm nodule with density -15 HU, stable, compatible with a benign adenoma. Musculoskeletal: No aggressive appearing focal osseous lesions. Mild thoracic spondylosis. IMPRESSION: 1. Moderate paratracheal and subcarinal mediastinal lymphadenopathy and right infrahilar lymphadenopathy, increased since 04/13/2019 chest CT, indeterminate for reactive versus malignant etiology. Management options include continued close chest CT with IV contrast follow-up in 3 months versus further evaluation with PET-CT or tissue sampling at this time, as clinically warranted. 2. New indistinct 0.7 cm medial right lower lobe subpleural solid pulmonary nodule. Recommend attention on follow-up chest CT in 3 months. 3. Solid posterior right upper lobe 0.7 cm pulmonary nodule is stable and more likely benign. 4. No compelling findings of interstitial lung disease. 5. Mild varicoid bronchiectasis in the anterior left lower lobe with associated mucoid impaction, mildly increased. 6. Thick bandlike scarring and volume loss throughout both lungs, most prominent in the mid to lower lungs, mildly worsened as detailed. Favor postinfectious/postinflammatory scarring. 7. Severe centrilobular emphysema with mild diffuse bronchial wall thickening, compatible with the provided history of COPD. 8. Chronic 4.7 cm ascending thoracic aortic aneurysm, stable. Ascending thoracic aortic aneurysm. Recommend semi-annual imaging followup by CTA or MRA and  referral to cardiothoracic surgery if not already obtained. This recommendation follows 2010 ACCF/AHA/AATS/ACR/ASA/SCA/SCAI/SIR/STS/SVM Guidelines for the Diagnosis and Management of Patients With Thoracic Aortic Disease. Circulation. 2010; 121: H607-P710. Aortic aneurysm NOS (ICD10-I71.9). 9. Prominently dilated main pulmonary artery, suggesting chronic pulmonary arterial hypertension. 10. Three-vessel coronary atherosclerosis. 11. Stable left adrenal adenoma. 12. Aortic Atherosclerosis (ICD10-I70.0) and Emphysema (ICD10-J43.9). These results  will be called to the ordering clinician or representative by the Radiologist Assistant, and communication documented in the PACS or [employer]. Electronically Signed   By: Ilona Sorrel M.D.   On: 01/14/2021 15:54

## 2021-01-15 NOTE — Telephone Encounter (Signed)
Olivia Mackie calling about the call report on the CT Chest HR.  Will route to MR to follow up on results.  Thanks

## 2021-01-15 NOTE — Telephone Encounter (Signed)
Spoke with the pt  I advised him of results and recommendations from Dr Chase Caller  Pt was hesitant to have PET and eval with [facility] or Byrum  He wants to talk with his spouse and then will call us back 01/16/21 I am forwarding to MR and back to triage to ensure this gets f/u

## 2021-01-15 NOTE — Telephone Encounter (Signed)
I have called and LM on VM for the pt to call back about these results.

## 2021-01-15 NOTE — Telephone Encounter (Signed)
Patient is aware of below message and voiced his understanding.  He will call back with a decision after speaking with his spouse and son.

## 2021-01-18 NOTE — Telephone Encounter (Signed)
Called pt to see if he has made choice on how he wants to proceed. There was no answer-LMTCB.

## 2021-02-05 ENCOUNTER — Telehealth: Payer: Self-pay | Admitting: Internal Medicine

## 2021-02-06 NOTE — Telephone Encounter (Signed)
Spoke with Cody [name]  He states that the he needs an order for NIV for this pt  He has a written order, but needs the next ov to state his need for NIV and why this will help him  He has upcoming appt with Dr Chase Caller on 02/12/21  Forwarding to MR

## 2021-02-06 NOTE — Telephone Encounter (Signed)
Ok thanks 

## 2021-02-12 ENCOUNTER — Ambulatory Visit: Payer: Medicare Other | Admitting: Internal Medicine

## 2021-02-22 ENCOUNTER — Encounter: Payer: Self-pay | Admitting: Internal Medicine

## 2021-02-22 ENCOUNTER — Ambulatory Visit (INDEPENDENT_AMBULATORY_CARE_PROVIDER_SITE_OTHER): Payer: Medicare Other | Admitting: Internal Medicine

## 2021-02-22 ENCOUNTER — Other Ambulatory Visit: Payer: Self-pay

## 2021-02-22 ENCOUNTER — Other Ambulatory Visit: Payer: Self-pay | Admitting: Internal Medicine

## 2021-02-22 VITALS — BP 122/76 | HR 99 | Temp 98.4°F | Ht 70.0 in | Wt 209.8 lb

## 2021-02-22 DIAGNOSIS — R590 Localized enlarged lymph nodes: Secondary | ICD-10-CM

## 2021-02-22 DIAGNOSIS — I2699 Other pulmonary embolism without acute cor pulmonale: Secondary | ICD-10-CM

## 2021-02-22 DIAGNOSIS — Z5181 Encounter for therapeutic drug level monitoring: Secondary | ICD-10-CM

## 2021-02-22 DIAGNOSIS — J441 Chronic obstructive pulmonary disease with (acute) exacerbation: Secondary | ICD-10-CM

## 2021-02-22 DIAGNOSIS — J438 Other emphysema: Secondary | ICD-10-CM | POA: Diagnosis not present

## 2021-02-22 DIAGNOSIS — R0609 Other forms of dyspnea: Secondary | ICD-10-CM

## 2021-02-22 DIAGNOSIS — J9611 Chronic respiratory failure with hypoxia: Secondary | ICD-10-CM

## 2021-02-22 DIAGNOSIS — J449 Chronic obstructive pulmonary disease, unspecified: Secondary | ICD-10-CM

## 2021-02-22 DIAGNOSIS — Z7189 Other specified counseling: Secondary | ICD-10-CM

## 2021-02-22 DIAGNOSIS — R911 Solitary pulmonary nodule: Secondary | ICD-10-CM

## 2021-02-22 LAB — CBC WITH DIFFERENTIAL/PLATELET
Basophils Absolute: 0 10*3/uL (ref 0.0–0.1)
Basophils Relative: 0.3 % (ref 0.0–3.0)
Eosinophils Absolute: 0 10*3/uL (ref 0.0–0.7)
Eosinophils Relative: 0.2 % (ref 0.0–5.0)
HCT: 50.1 % (ref 39.0–52.0)
Hemoglobin: 15.7 g/dL (ref 13.0–17.0)
Lymphocytes Relative: 6.6 % — ABNORMAL LOW (ref 12.0–46.0)
Lymphs Abs: 0.9 10*3/uL (ref 0.7–4.0)
MCHC: 31.3 g/dL (ref 30.0–36.0)
MCV: 96.8 fl (ref 78.0–100.0)
Monocytes Absolute: 1.4 10*3/uL — ABNORMAL HIGH (ref 0.1–1.0)
Monocytes Relative: 10 % (ref 3.0–12.0)
Neutro Abs: 11.5 10*3/uL — ABNORMAL HIGH (ref 1.4–7.7)
Neutrophils Relative %: 82.9 % — ABNORMAL HIGH (ref 43.0–77.0)
Platelets: 175 10*3/uL (ref 150.0–400.0)
RBC: 5.18 Mil/uL (ref 4.22–5.81)
RDW: 17.2 % — ABNORMAL HIGH (ref 11.5–15.5)
WBC: 13.9 10*3/uL — ABNORMAL HIGH (ref 4.0–10.5)

## 2021-02-22 LAB — HEPATIC FUNCTION PANEL
ALT: 19 U/L (ref 0–53)
AST: 18 U/L (ref 0–37)
Albumin: 4.2 g/dL (ref 3.5–5.2)
Alkaline Phosphatase: 72 U/L (ref 39–117)
Bilirubin, Direct: 0.7 mg/dL — ABNORMAL HIGH (ref 0.0–0.3)
Total Bilirubin: 2.3 mg/dL — ABNORMAL HIGH (ref 0.2–1.2)
Total Protein: 6.8 g/dL (ref 6.0–8.3)

## 2021-02-22 LAB — PROTIME-INR
INR: 1.1 ratio — ABNORMAL HIGH (ref 0.8–1.0)
Prothrombin Time: 11.6 s (ref 9.6–13.1)

## 2021-02-22 LAB — BASIC METABOLIC PANEL
BUN: 18 mg/dL (ref 6–23)
CO2: 40 mEq/L — ABNORMAL HIGH (ref 19–32)
Calcium: 9.4 mg/dL (ref 8.4–10.5)
Chloride: 95 mEq/L — ABNORMAL LOW (ref 96–112)
Creatinine, Ser: 1.23 mg/dL (ref 0.40–1.50)
GFR: 58.56 mL/min — ABNORMAL LOW (ref >60.00)
Glucose, Bld: 127 mg/dL — ABNORMAL HIGH (ref 70–99)
Potassium: 4.4 mEq/L (ref 3.5–5.1)
Sodium: 142 mEq/L (ref 135–145)

## 2021-02-22 LAB — APTT: aPTT: 32.9 s — ABNORMAL HIGH (ref 23.4–32.7)

## 2021-02-22 LAB — BRAIN NATRIURETIC PEPTIDE: Pro B Natriuretic peptide (BNP): 433 pg/mL — ABNORMAL HIGH (ref 0.0–100.0)

## 2021-02-22 MED ORDER — PREDNISONE 10 MG PO TABS: ORAL_TABLET | ORAL | 0 refills | Status: AC

## 2021-02-22 MED ORDER — TIOTROPIUM BROMIDE MONOHYDRATE 18 MCG IN CAPS: 18.0000 ug | ORAL_CAPSULE | Freq: Every day | RESPIRATORY_TRACT | 12 refills | Status: DC

## 2021-02-22 MED ORDER — AZITHROMYCIN 250 MG PO TABS: ORAL_TABLET | ORAL | 0 refills | Status: DC

## 2021-02-22 MED ORDER — TRELEGY ELLIPTA 200-62.5-25 MCG/ACT IN AEPB: 1.0000 | INHALATION_SPRAY | Freq: Every day | RESPIRATORY_TRACT | 0 refills | Status: DC

## 2021-02-22 MED ORDER — FLUTICASONE FUROATE-VILANTEROL 100-25 MCG/ACT IN AEPB: 1.0000 | INHALATION_SPRAY | Freq: Every day | RESPIRATORY_TRACT | 4 refills | Status: DC

## 2021-02-22 NOTE — Progress Notes (Signed)
OV 11/25/2016  Chief Complaint  Patient presents with   Follow-up    Pt here after PFT. Pt states his breathing is unchanged since last OV. Pt states he has an occassional cough. Pt denies CP/tightness and f/c/s.   Follow-up advanced COPD chronic hypoxic respiratory failure: He had pulmonary function test today showed stage III COPD. He has significant bronchodilator response of 17%. He is on Spiriva and low-dose Brio. He feels it really helps him. We discussed lung transportation he declined evaluation. 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 His increase his dose on antidepressants he feels more optimistic now. He feels he canlive another 5-10 years with COPD and continue the same.   Follow-up lung nodule: May 2018 he ruled out pulmonary embolism. During this time incidental right upper lobe lung nodule 5 mm was discovered. It is enlarged since 2014 but same since 2017 December.   OV 08/14/2017  Chief Complaint  Patient presents with   Follow-up    Pt has complaints of SOB that is worse with exertion. Denies any cough or CP.    Follow-up advanced COPD with chronic hypoxemic restaurant failure: He is on triple inhaler therapy with Spiriva and Brio.  No symptoms COPD exacerbation since last visit.  He is doing great.  He supports his granddaughters.  1 of them does not want to work the other one is graduating tomorrow from college.  Another one has 1 more year to go.  He is slowly retiring.  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  He does not want transplant.  He continues on oxygen.  He is asking about Trelegy but after discussion decided to defer it.  5 mm right apical lung nodule 5 mm seen in May 2018 with growth since 2014: He did have a CT for cardiac reasons in September 2018 and this nodule was deemed stable.  There are no other issues.  Results for MIROSLAV, GIN (MRN  [id number]) as of 11/25/2016 12:12  Ref. Range 11/25/2016 10:12  FEV1-Post Latest Units: L 1.37  FEV1-%Pred-Post Latest Units: % 42  FEV1-%Change-Post Latest Units: % 17  Post FEV1/FVC ratio Latest Units: % 47   Results for KHYRE, GERMOND (MRN [id number]) as of 11/25/2016 12:12  Ref. Range 11/25/2016 10:12  DLCO cor Latest Units: ml/min/mmHg 12.09  DLCO cor % pred Latest Units: % 39     OV 01/16/2018  Subjective:  Patient ID: Cody [name], male , DOB: May 12, 1950 , age 70 y.o. , MRN: [id number] , ADDRESS: [place] [address]   01/16/2018 -   Chief Complaint  Patient presents with   Follow-up    Doing well at this time no changes.     HPI TAHMID STONEHOCKER 70 y.o. -advanced COPD with chronic hypoxemic respiratory failure follow-up.  Also right upper lobe lung nodules follow-up.    He reports overall he stable.  He is not quit work.  COPD CAT score is 9 so he is doing well.  CT scan does not show any worsening emphysema or pulmonary fibrosis onset of cancer.  Lung nodule is stable.  He has new finding of worsening thoracic artery aorta aneurysm.  He is asymptomatic.  He has not seen Dr. Dahlia Byes in a year and a half according to his history.  He has not had his flu shot  and says he will have an outside.  He does not want to have it with [place] today.  See him returned his oxygen off given the fact his COPD CAT score is so low.  And his walking desaturation test on room air shows -   Below desats with walking, needs 4L  to correct.  However he wants his oxygen for safety and comfort     CAT COPD Symptom & Quality of Life Score ([place] trademark) 0 is no burden. 5 is highest burden 01/16/2018   Never Cough -> Cough all the time 1  No phlegm in chest -> Chest is full of phlegm 1  No chest tightness -> Chest feels very tight 0  No dyspnea for 1 flight stairs/hill -> Very dyspneic for 1 flight of stairs 4  No limitations for ADL at home -> Very limited with ADL at  home 0  Confident leaving home -> Not at all confident leaving home 0  Sleep soundly -> Do not sleep soundly because of lung condition 0  Lots of Energy -> No energy at all 3  TOTAL Score (max 40)  9    Simple office walk 185 feet x  3 laps goal with forehead probe 01/16/2018   O2 used  after being on room air for 15 minutes  Number laps completed  1 lap and desaturated  Comments about pace  fast pace  Resting Pulse Ox/HR  92% % and 96/min  Final Pulse Ox/HR  86% % and 104/min -after first lap  Desaturated </= 88%  yes  Desaturated <= 3% points  yes  Got Tachycardic >/= 90/min  yes  Symptoms at end of test  dyspnea  Miscellaneous comments  required 4 L to correct and do 3 laps       ROS - per HPI  IMPRESSION: 1. Stable exam. 2. No change in the 5-6 mm posterior right upper lobe pulmonary nodule. 3. Ascending thoracic aorta measures 4.7 mm compared to 4.5 cm previously. Ascending thoracic aortic aneurysm. Recommend semi-annual imaging followup by CTA or MRA and referral to cardiothoracic surgery if not already obtained. This recommendation follows 2010 ACCF/AHA/AATS/ACR/ASA/SCA/SCAI/SIR/STS/SVM Guidelines for the Diagnosis and Management of Patients With Thoracic Aortic Disease. Circulation. 2010; 121: M426-S341 4.  Aortic Atherosclerois (ICD10-170.0) 5.  Emphysema. (ICD10-J43.9) 6. Stable left adrenal adenoma.     Electronically Signed   By: [name] M.D.   On: 01/13/2018 16:26   OV 07/17/2020  Subjective:  Patient ID: Cody [name], male , DOB: Feb 20, 1951 , age 70 y.o. , MRN: [id number] , ADDRESS: [place]  [id number] PCP Chesley Noon, MD Patient Care Team: Chesley Noon, MD as PCP - General (Family Medicine) Brand Males, MD as Consulting Physician (Pulmonary Disease) Sueanne Margarita, MD as Consulting Physician (Cardiology)  This Provider for this visit: Treatment Team:  Attending Provider: Brand Males,  MD    07/17/2020 -   Chief Complaint  Patient presents with   Follow-up    Pt states he is a little more SOB than he was at last office visit. Requiring 4-5L O2 24/7.     HPI GRIER VU 70 y.o. -  Follow-up advanced COPD chronic hypoxemic respiratory failure Follow-up right upper lobe lung nodule   Returns for follow-up.  I personally saw him in 2019.  After the onset of the pandemic have not seen him.  He saw a nurse practitioner once in 2020.  He says he has been  isolating but quite active doing farming but needing 5 L of oxygen because of the mask.  He has had his 2 vaccines.  He is frustrated his family will not get vaccinated.  He has gained weight because of the pandemic but has managed to stay himself without Covid.  He is on Spiriva and Breo and oxygen.  Otherwise he is doing well.  He is willing to get restage.  Review of the records indicate he has thoracic aorta aneurysm that is growing.  He is only had 2 shots of the Covid vaccine.  CAT Score 07/17/2020  Total CAT Score 16      CT chest Jan 2021   IMPRESSION: 1. Slight interval increase in size of the ascending thoracic aortic aneurysm, now measuring up to 4.9 cm, previously 4.7 cm. 2. Slight interval increase in size of the right upper lobe pulmonary nodule, now measuring 8 mm, previously 6 mm. This has slowly enlarged since 2014 when it measured 3 mm. Continued attention on follow-up is recommended. Consultation with pulmonary medicine or thoracic surgery is suggested, as clinically appropriate. 3. Aortic Atherosclerosis (ICD10-I70.0). 4.  Emphysema (ICD10-J43.9).     Electronically Signed   By: Titus Dubin M.D.   On: 04/13/2019 13:28    01/04/2021 Follow up: COPD, O2 RF , Lung nodule  Patient returns for a 45-month follow-up.  Patient has underlying severe COPD that is oxygen dependent.  He is maintained on Breo and Spiriva. Complains that over last few months breathing not doing as well, increased  dyspnea with activitity and decreased activity tolerance.  No calf pain or hemoptysis.  Remains on chronic oxygen at 4l/m . Does turn up to 5l/m, to help with dyspnea. Does not check oxygen sats at home .   Patient has known lung nodule that has slowly increased in size since 2014.  CT chest January 2021 showed slight increase in pulmonary nodule measuring 8 mm previously at 6 mm RUL  Patient was set up for a high-resolution CT chest and also pulmonary function testing.  Unfortunately patient did not return for these.  Remains active at home, does building projects, farming.   Had labs done 3 months ago at PCP reported as okay .      OV 02/22/2021  Subjective:  Patient ID: Cody [name], male , DOB: 1950/11/21 , age 72 y.o. , MRN: [id number] , ADDRESS: [place] [id number] PCP Chesley Noon, MD Patient Care Team: Chesley Noon, MD as PCP - General (Family Medicine) Brand Males, MD as Consulting Physician (Pulmonary Disease) Sueanne Margarita, MD as Consulting Physician (Cardiology)  This Provider for this visit: Treatment Team:  Attending Provider: Brand Males, MD    02/22/2021 -   Chief Complaint  Patient presents with   Follow-up    Breathing has worsened     HPI Cody [name] 70 y.o. - 85% - 5L at rest at forehead probe.  He returns for follow-up.  He presents with his granddaughter Santa Genera who I am meeting for the first time.  Patient lives with his wife.  Granddaughter lives nearby.  I saw him after a long time in April 2022.  Recommended CT scan for nodule.  He delayed getting this.  Finally got it and it shows worsening nodularity and also mediastinal adenopathy.  Very concerning for non-small cell lung cancer advanced.  We then ordered PET scan but has not gotten it.  He states he is very afraid of the  fact that he might be diagnosed with lung cancer and if it is lung cancer then there would be no treatment.  Therefore he is  put off getting the CT scan and now also putting of the PET scan.  He says he is scared of getting a biopsy.  He says that he will not be able to tolerate getting a biopsy.  He is also worried that if it is lung cancer he is not to fit for treatment.  He says his quality of life is deteriorated.  For the last 3 weeks he is more hypoxemic.  He is also coughing more and is got gray sputum.  His pulse ox today on forehead probe was 85% on 5 L.  He says at home he is using up to 8 L.  He has not passed out.  He says he tries not to pass out or fall.  His quality of life is deteriorated.  His mobility is restricted because of the worsening shortness of breath and hypoxemia.  I spoke to him at length about getting admitted.  I asked him to think for a little bit and then again went back and spoke to him about getting admitted.  He refused.  He says he just wants to go home.  We talked about the possibility of getting home hospice but he does not want this either.  He says he rather find out what he has before committing to hospice.  We talked about rechecking blood gas and getting a BiPAP.  He agreed for the blood gas but is not so sure about getting BiPAP.  I went over the contradictions that he does not want admission does not want hospice and at the same time he is reluctant to get diagnostic investigations and is worried about the risk of procedures.  We resolved that currently the intent would be to diagnose and treat.  Talked about CODE STATUS.  Advised no CPR and no intubation in the case of decline or cardiorespiratory arrest.  He is verbally agreed to this.  His granddaughter Marguarite Arbour t acknowledged this conversation.      Latest Reference Range & Units 04/12/12 20:13 04/13/12 00:50 04/13/12 17:40 04/14/12 03:59 04/14/12 05:49 04/14/12 11:02 04/15/12 10:24 04/16/12 04:12 04/16/12 05:51 04/16/12 12:00 04/16/12 21:36 04/16/12 22:53 04/17/12 01:34 04/17/12 11:18 04/18/12 02:40 04/20/12 04:03 04/22/12 13:07  04/25/12 12:26 04/26/12 08:54 04/27/12 03:55 04/28/12 17:02 04/30/12 08:39 05/02/12 21:15 06/04/12 01:39  pH, Arterial 7.350 - 7.450  7.182 (LL) 7.240 (L) 7.256 (L) 7.236 (L) 7.299 (L) 7.250 (L) 7.307 (L) 7.359 7.395 7.312 (L) 7.276 (L) 7.336 (L) 7.415 7.292 (L) 7.337 (L) 7.353 7.430 7.350 7.387 7.382 7.352 7.337 (L) 7.475 (H) 7.485 (H)  pCO2 arterial 35.0 - 45.0 mmHg 83.4 (HH) 76.3 (HH) 83.9 (HH) 78.1 (HH) 73.3 (HH) 78.9 (HH) 77.1 (HH) 76.9 (HH) 68.9 (HH) 87.6 (HH) 96.7 (HH) 83.9 (HH) 69.0 (HH) 90.5 (HH) 84.2 (HH) 74.5 (HH) 65.5 (HH) 60.6 (HH) 54.8 (H) 57.0 (H) 66.9 (HH) 68.1 (HH) 53.7 (H) 49.3 (H)  pO2, Arterial 80.0 - 100.0 mmHg 260.0 (H) 74.9 (L) 203.0 (H) 64.0 (L) 65.0 (L) 69.0 (L) 62.0 (L) 68.7 (L) 77.0 (L) 75.0 (L) 73.0 (L) 66.0 (L) 64.0 (L) 68.0 (L) 92.9 68.0 (L) 55.0 (L) 130.0 (H) 56.0 (L) 64.7 (L) 78.0 (L) 84.0 52.3 (L) 75.0 (L)  (LL): Data is critically low (HH): Data is critically high (L): Data is abnormally low (H): Data is abnormally high  CT Chest data HRCT  IMPRESSION: 1. Moderate paratracheal and subcarinal mediastinal lymphadenopathy and right infrahilar lymphadenopathy, increased since 04/13/2019 chest CT, indeterminate for reactive versus malignant etiology. Management options include continued close chest CT with IV contrast follow-up in 3 months versus further evaluation with PET-CT or tissue sampling at this time, as clinically warranted. 2. New indistinct 0.7 cm medial right lower lobe subpleural solid pulmonary nodule. Recommend attention on follow-up chest CT in 3 months. 3. Solid posterior right upper lobe 0.7 cm pulmonary nodule is stable and more likely benign. 4. No compelling findings of interstitial lung disease. 5. Mild varicoid bronchiectasis in the anterior left lower lobe with associated mucoid impaction, mildly increased. 6. Thick bandlike scarring and volume loss throughout both lungs, most prominent in the mid to lower lungs, mildly worsened  as detailed. Favor postinfectious/postinflammatory scarring. 7. Severe centrilobular emphysema with mild diffuse bronchial wall thickening, compatible with the provided history of COPD. 8. Chronic 4.7 cm ascending thoracic aortic aneurysm, stable. Ascending thoracic aortic aneurysm. Recommend semi-annual imaging followup by CTA or MRA and referral to cardiothoracic surgery if not already obtained. This recommendation follows 2010 ACCF/AHA/AATS/ACR/ASA/SCA/SCAI/SIR/STS/SVM Guidelines for the Diagnosis and Management of Patients With Thoracic Aortic Disease. Circulation. 2010; 121: Z610-R604. Aortic aneurysm NOS (ICD10-I71.9). 9. Prominently dilated main pulmonary artery, suggesting chronic pulmonary arterial hypertension. 10. Three-vessel coronary atherosclerosis. 11. Stable left adrenal adenoma. 12. Aortic Atherosclerosis (ICD10-I70.0) and Emphysema (ICD10-J43.9).   These results will be called to the ordering clinician or representative by the Radiologist Assistant, and communication documented in the PACS or [employer].     Electronically Signed   By: Ilona Sorrel M.D.   On: 01/14/2021 15:54  No results found.    PFT  PFT Results Latest Ref Rng & Units 11/25/2016  FVC-Pre L 2.80  FVC-Predicted Pre % 65  FVC-Post L 2.88  FVC-Predicted Post % 66  Pre FEV1/FVC % % 42  Post FEV1/FCV % % 47  FEV1-Pre L 1.17  FEV1-Predicted Pre % 36  FEV1-Post L 1.37  DLCO uncorrected ml/min/mmHg 12.81  DLCO UNC% % 41  DLCO corrected ml/min/mmHg 12.09  DLCO COR %Predicted % 39  DLVA Predicted % 51  TLC L 6.68  TLC % Predicted % 97  RV % Predicted % 156       has a past medical history of Anxiety, Arthritis, Bleeding nose, Blood dyscrasia, COPD (chronic obstructive pulmonary disease) ([place]), Diabetes mellitus without complication ([place]), Erythrocytosis, GERD (gastroesophageal reflux disease), Hemochromatosis, Hyperlipidemia, Hypertension, Neuromuscular disorder ([place]), Oxygen  dependent, Pneumonia, Pulmonary embolism ([place]) (05/2012), Rectal bleed, Shortness of breath, Shoulder pain, Stroke ([place]), and Thoracic aortic aneurysm.   reports that he quit smoking about 8 years ago. His smoking use included cigarettes. He started smoking about 56 years ago. He has a 125.00 pack-year smoking history. He has never used smokeless tobacco.  Past Surgical History:  Procedure Laterality Date   GASTROSTOMY N/A 05/19/2012   Procedure: PEG Possible Open Gastrostomy;  Surgeon: Shann Medal, MD;  Location: [facility];  Service: General;  Laterality: N/A;   INSERTION OF VENA CAVA FILTER     to prevent further pumonary embolis   MULTIPLE TOOTH EXTRACTIONS     TRACHEOSTOMY  2014   & closure, relative to pneumonia    VIDEO BRONCHOSCOPY Bilateral 06/03/2012   Procedure: VIDEO BRONCHOSCOPY WITHOUT FLUORO;  Surgeon: Raylene Miyamoto, MD;  Location: [facility];  Service: Cardiopulmonary;  Laterality: Bilateral;    Allergies  Allergen Reactions   Protamine Anaphylaxis   Heparin Other (See Comments)  Bleeding, into muscles Bleeding, into muscles   Ace Inhibitors Cough    Immunization History  Administered Date(s) Administered   Influenza Split 05/28/2012, 01/13/2014   Influenza, High Dose Seasonal PF 01/13/2016, 02/13/2016, 02/03/2017, 02/19/2018, 02/12/2019   Influenza,inj,Quad PF,6+ Mos 05/18/2014   PFIZER(Purple Top)SARS-COV-2 Vaccination 09/24/2019, 10/16/2019   Pneumococcal Conjugate-13 01/10/2014   Pneumococcal Polysaccharide-23 04/27/2012, 05/28/2012, 05/19/2018   Tdap 10/15/2016    Family History  Problem Relation Age of Onset   COPD Mother      Current Outpatient Medications:    albuterol (PROVENTIL HFA;VENTOLIN HFA) 108 (90 Base) MCG/ACT inhaler, Inhale 2 puffs into the lungs every 6 (six) hours as needed for wheezing or shortness of breath., Disp: 1 Inhaler, Rfl: 5   amLODipine (NORVASC) 10 MG tablet, Take 5 mg by mouth every morning. Take 0.5 tablets (5 mg  total) by mouth daily., Disp: , Rfl:    buPROPion (WELLBUTRIN XL) 300 MG 24 hr tablet, , Disp: , Rfl: 2   fluticasone furoate-vilanterol (BREO ELLIPTA) 100-25 MCG/INH AEPB, Inhale 1 puff into the lungs daily at 6 (six) AM., Disp: 60 each, Rfl: 0   hydrochlorothiazide (HYDRODIURIL) 12.5 MG tablet, Take 2 tablets (25 mg total) by mouth daily., Disp: 90 tablet, Rfl: 3   ipratropium-albuterol (DUONEB) 0.5-2.5 (3) MG/3ML SOLN, USE 1 AMPULE IN NEBULIZER EVERY 4 HOURS, Disp: , Rfl:    losartan (COZAAR) 100 MG tablet, Take 1 tablet by mouth daily., Disp: , Rfl:    metFORMIN (GLUCOPHAGE) 500 MG tablet, Take by mouth 2 (two) times daily with a meal., Disp: , Rfl:    metoprolol succinate (TOPROL-XL) 25 MG 24 hr tablet, Take 1 tablet by mouth daily., Disp: , Rfl:    tiotropium (SPIRIVA) 18 MCG inhalation capsule, Place 1 capsule (18 mcg total) into inhaler and inhale daily., Disp: 30 capsule, Rfl: 12   predniSONE (DELTASONE) 20 MG tablet, Take 1 tablet (20 mg total) by mouth daily with breakfast. (Patient not taking: Reported on 02/22/2021), Disp: 3 tablet, Rfl: 0      Objective:   Vitals:   02/22/21 1146  BP: 122/76  Pulse: 99  Temp: 98.4 F (36.9 C)  SpO2: (!) 82%  Weight: 209 lb 12.8 oz (95.2 kg)  Height: 5\' 10"  (1.778 m)    Estimated body mass index is 30.1 kg/m as calculated from the following:   Height as of this encounter: 5\' 10"  (1.778 m).   Weight as of this encounter: 209 lb 12.8 oz (95.2 kg).  @WEIGHTCHANGE @  [employer]   02/22/21 1146  Weight: 209 lb 12.8 oz (95.2 kg)     Physical Exam very deconditioned male sitting on the wheelchair.  He has oxygen on but is 5 L nasal cannula going through the mouth.  He does not have any wheezing has some lower lobe crackles.  Able to talk full sentences looks very deconditioned.  Firstly breathing normal heart sounds abdomen soft no sinus no clubbing no edema.  No rash     Assessment:       ICD-10-CM   1. Stage 3 severe COPD by GOLD  classification ([place])  J44.9     2. Chronic respiratory failure with hypoxia (HCC)  J96.11     3. COPD with acute exacerbation ([place])  J44.1     4. Nodule of apex of right lung  R91.1     5. Mediastinal adenopathy  R59.0     6. Goals of care, counseling/discussion  Z71.89  Plan:     Patient Instructions     ICD-10-CM   1. Stage 3 severe COPD by GOLD classification ([place])  J44.9     2. Chronic respiratory failure with hypoxia (HCC)  J96.11     3. COPD with acute exacerbation ([place])  J44.1     4. Nodule of apex of right lung  R91.1     5. Mediastinal adenopathy  R59.0     6. Goals of care, counseling/discussion  Z71.89      Stage 3 severe COPD by GOLD classification ([place]) Chronic respiratory failure with hypoxia (HCC) COPD with acute exacerbation ([place])   Plan - respect deferring admission which I strongly recommended - Z pak  -Take prednisone 40 mg daily x 2 days, then 20mg  daily x 2 days, then 10mg  daily x 2 days, then 5mg  daily x 2 days and stop - continue o2 - goal pulse ox > 86%  - titrate upto 8-10L if needed - continue breo and spiriva schedule  - CMA to ensure refill  - cotninue alb neb as needed - start albuterol mdi 2 puff as needed  - check ABG next few to sevral days- based on this decision for nocturnal BiPAP - check cbc, bmet, ptt, pt, lft, bnp 02/22/2021 - go to ER if worse  - ok to treat you in ER or hospital with intent of gettingg better but things got worse do not recommend intubation or CPR given your advanced medical issues  Nodule of apex of right lung Mediastinal adenopathy  Plan -  PET scan next few to several days  Followup  - < 10 days wit [facility], [place], APP or Dr Chase Caller to review results and decided next step  ( Level 05 visit: Estb 40-54 min in  visit type: on-site physical face to visit  in total care time and counseling or/and coordination of care by this undersigned MD - Dr Brand Males. This includes one or more of  the following on this same day 02/22/2021: pre-charting, chart review, note writing, documentation discussion of test results, diagnostic or treatment recommendations, prognosis, risks and benefits of management options, instructions, education, compliance or risk-factor reduction. It excludes time spent by the [facility] or office staff in the care of the patient. Actual time 50 min)   SIGNATURE    Dr. Brand Males, M.D., F.C.C.P,  Pulmonary and Critical Care Medicine Staff Physician, [place] Director - Interstitial Lung Disease  Program  P[facility] at [place], [place], [address]  Pager: [phone], If no answer or between  15:00h - 7:00h: call [phone] Telephone: [phone]  12:52 PM 02/22/2021

## 2021-02-22 NOTE — Telephone Encounter (Signed)
Pt has OV this morning 02/22/21.

## 2021-02-22 NOTE — Addendum Note (Signed)
Addended by: Konrad Felix L on: 02/22/2021 02:45 PM   Modules accepted: Orders

## 2021-02-22 NOTE — Addendum Note (Signed)
Addended by: Elby Beck R on: 02/22/2021 03:39 PM   Modules accepted: Orders

## 2021-02-22 NOTE — Patient Instructions (Addendum)
ICD-10-CM   1. Stage 3 severe COPD by GOLD classification ([place])  J44.9     2. Chronic respiratory failure with hypoxia (HCC)  J96.11     3. COPD with acute exacerbation ([place])  J44.1     4. Nodule of apex of right lung  R91.1     5. Mediastinal adenopathy  R59.0     6. Goals of care, counseling/discussion  Z71.89      Stage 3 severe COPD by GOLD classification ([place]) Chronic respiratory failure with hypoxia (HCC) COPD with acute exacerbation ([place])   Plan - respect deferring admission which I strongly recommended - Z pak  -Take prednisone 40 mg daily x 2 days, then 20mg  daily x 2 days, then 10mg  daily x 2 days, then 5mg  daily x 2 days and stop - continue o2 - goal pulse ox > 86%  - titrate upto 8-10L if needed - continue breo and spiriva schedule  - CMA to ensure refill  - cotninue alb neb as needed - start albuterol mdi 2 puff as needed  - check ABG next few to sevral days- based on this decision for nocturnal BiPAP - check cbc, bmet, ptt, pt, lft, bnp 02/22/2021 - go to ER if worse  - ok to treat you in ER or hospital with intent of gettingg better but things got worse do not recommend intubation or CPR given your advanced medical issues  Nodule of apex of right lung Mediastinal adenopathy  Plan -  PET scan next few to several days  Followup  - < 10 days wit Candida Peeling, APP or Dr Chase Caller to review results and decided next step First patient ready

## 2021-02-23 ENCOUNTER — Telehealth: Payer: Self-pay | Admitting: Internal Medicine

## 2021-02-23 DIAGNOSIS — J9611 Chronic respiratory failure with hypoxia: Secondary | ICD-10-CM

## 2021-02-23 NOTE — Telephone Encounter (Signed)
I called and spoke with [facility]  They are needing liter flow rx sent  Pt on 8-10 lpm now based on recent ov  They have already provided him with 10 lpm concentrator  Order placed

## 2021-03-07 ENCOUNTER — Telehealth: Payer: Self-pay | Admitting: Internal Medicine

## 2021-03-07 ENCOUNTER — Ambulatory Visit: Payer: Medicare Other | Admitting: Adult Health

## 2021-03-07 NOTE — Telephone Encounter (Signed)
I left a message to call back about  note below.

## 2021-03-27 NOTE — Telephone Encounter (Signed)
Pt further had the CT results discussed by PCP office 03/21/21.

## 2021-04-08 DEATH — deceased

## 2021-12-08 ENCOUNTER — Telehealth: Payer: Self-pay | Admitting: Internal Medicine

## 2021-12-08 NOTE — Telephone Encounter (Signed)
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 Seen in nov 2022 and was supposed to come back real soon for followup. No followup since then. Please facilitate  Thanks    SIGNATURE    Dr. Brand Males, M.D., F.C.C.P,  Pulmonary and Critical Care Medicine Staff Physician, [place] Director - Interstitial Lung Disease  Program  Medical Director - [facility] [place] [facility] [place], [place], [address]  NPI Number:  NPI #[id number] [facility] Number: [id number]  Pager: [phone], If no answer  -[place] or Try [phone] Telephone (clinical office): [phone] Telephone (research): [phone]  11:47 PM 12/08/2021

## 2021-12-13 NOTE — Telephone Encounter (Signed)
Unable to leave voicemail, sent mychart message.
